# Patient Record
Sex: Female | Born: 1950 | Race: Black or African American | Hispanic: No | Marital: Married | Smoking: Never smoker
Health system: Southern US, Community
[De-identification: ages and names within clinical notes are randomized; demographics above are authoritative.]

## PROBLEM LIST (undated history)

## (undated) ENCOUNTER — Ambulatory Visit: Payer: MEDICARE

## (undated) ENCOUNTER — Ambulatory Visit: Payer: MEDICARE | Attending: Adult Health | Primary: Adult Health

## (undated) ENCOUNTER — Telehealth: Attending: Hematology & Oncology | Primary: Hematology & Oncology

## (undated) ENCOUNTER — Encounter: Attending: Hematology & Oncology | Primary: Hematology & Oncology

## (undated) ENCOUNTER — Ambulatory Visit

## (undated) ENCOUNTER — Encounter: Attending: Internal Medicine | Primary: Internal Medicine

## (undated) ENCOUNTER — Encounter

## (undated) ENCOUNTER — Encounter: Attending: Pharmacist | Primary: Pharmacist

## (undated) ENCOUNTER — Encounter: Attending: Infectious Disease | Primary: Infectious Disease

## (undated) ENCOUNTER — Telehealth: Attending: Adult Health | Primary: Adult Health

## (undated) ENCOUNTER — Encounter: Attending: Adult Health | Primary: Adult Health

## (undated) ENCOUNTER — Telehealth: Attending: Internal Medicine | Primary: Internal Medicine

## (undated) ENCOUNTER — Encounter
Attending: Student in an Organized Health Care Education/Training Program | Primary: Student in an Organized Health Care Education/Training Program

## (undated) ENCOUNTER — Ambulatory Visit
Attending: Student in an Organized Health Care Education/Training Program | Primary: Student in an Organized Health Care Education/Training Program

## (undated) ENCOUNTER — Ambulatory Visit: Payer: MEDICARE | Attending: Hematology & Oncology | Primary: Hematology & Oncology

## (undated) ENCOUNTER — Telehealth

## (undated) ENCOUNTER — Ambulatory Visit: Attending: Hematology & Oncology | Primary: Hematology & Oncology

## (undated) DIAGNOSIS — I10 Essential (primary) hypertension: Secondary | ICD-10-CM

## (undated) DIAGNOSIS — E119 Type 2 diabetes mellitus without complications: Secondary | ICD-10-CM

## (undated) DIAGNOSIS — Z856 Personal history of leukemia: Secondary | ICD-10-CM

## (undated) DIAGNOSIS — N186 End stage renal disease: Secondary | ICD-10-CM

## (undated) DIAGNOSIS — N189 Chronic kidney disease, unspecified: Secondary | ICD-10-CM

## (undated) HISTORY — DX: Chronic kidney disease, unspecified: N18.9

## (undated) MED ORDER — GUAIFENESIN 100 MG/5 ML ORAL LIQUID: ORAL | 0 days

## (undated) MED ORDER — ZANUBRUTINIB 80 MG CAPSULE: ORAL | 0 days

## (undated) MED ORDER — ASPIRIN 81 MG TABLET,DELAYED RELEASE: Freq: Every day | ORAL | 0.00000 days

## (undated) MED ORDER — OXYCODONE 5 MG CAPSULE: Freq: Four times a day (QID) | ORAL | 0 days | PRN

## (undated) MED ORDER — OXYCODONE 5 MG TABLET: ORAL | 0 days | Status: SS | PRN

## (undated) MED ORDER — IPRATROPIUM 0.5 MG-ALBUTEROL 3 MG (2.5 MG BASE)/3 ML NEBULIZATION SOLN: RESPIRATORY_TRACT | 0 days

## (undated) MED ORDER — CARVEDILOL 25 MG TABLET: Freq: Two times a day (BID) | ORAL | 0 days

## (undated) MED ORDER — FUROSEMIDE 80 MG TABLET: Freq: Two times a day (BID) | ORAL | 0 days

## (undated) MED ORDER — BENZONATATE 100 MG CAPSULE: ORAL | 0 days

## (undated) MED ORDER — DOXERCALCIFEROL 2 MCG/ML INTRAVENOUS SOLUTION: INTRAVENOUS | 0 days

---

## 1989-10-22 HISTORY — PX: ABDOMINAL HYSTERECTOMY: SHX81

## 2012-12-03 LAB — HM DEXA SCAN

## 2013-01-20 LAB — HM MAMMOGRAPHY

## 2013-05-27 ENCOUNTER — Emergency Department (HOSPITAL_COMMUNITY)
Admission: EM | Admit: 2013-05-27 | Discharge: 2013-05-27 | Disposition: A | Discharge status: Home / Self Care | Payer: Medicaid - Out of State | Attending: Emergency Medicine | Admitting: Emergency Medicine

## 2013-05-27 ENCOUNTER — Encounter (HOSPITAL_COMMUNITY): Payer: Self-pay | Admitting: Emergency Medicine

## 2013-05-27 ENCOUNTER — Emergency Department (HOSPITAL_COMMUNITY): Payer: Medicaid - Out of State

## 2013-05-27 DIAGNOSIS — C9110 Chronic lymphocytic leukemia of B-cell type not having achieved remission: Secondary | ICD-10-CM | POA: Insufficient documentation

## 2013-05-27 DIAGNOSIS — I10 Essential (primary) hypertension: Secondary | ICD-10-CM | POA: Insufficient documentation

## 2013-05-27 DIAGNOSIS — Z862 Personal history of diseases of the blood and blood-forming organs and certain disorders involving the immune mechanism: Secondary | ICD-10-CM | POA: Insufficient documentation

## 2013-05-27 DIAGNOSIS — E119 Type 2 diabetes mellitus without complications: Secondary | ICD-10-CM | POA: Insufficient documentation

## 2013-05-27 DIAGNOSIS — Z79899 Other long term (current) drug therapy: Secondary | ICD-10-CM | POA: Insufficient documentation

## 2013-05-27 DIAGNOSIS — M545 Low back pain, unspecified: Secondary | ICD-10-CM | POA: Insufficient documentation

## 2013-05-27 DIAGNOSIS — M25551 Pain in right hip: Secondary | ICD-10-CM

## 2013-05-27 DIAGNOSIS — M25559 Pain in unspecified hip: Secondary | ICD-10-CM | POA: Insufficient documentation

## 2013-05-27 HISTORY — DX: Type 2 diabetes mellitus without complications: E11.9

## 2013-05-27 HISTORY — DX: Essential (primary) hypertension: I10

## 2013-05-27 HISTORY — DX: Personal history of leukemia: Z85.6

## 2013-05-27 IMAGING — CR DG LUMBAR SPINE COMPLETE 4+V
5 series · 5 of 5 positions shown · non-contrast
Comparison: None.

CLINICAL DATA: Low back and right hip pain for 1 week

LUMBAR SPINE - COMPLETE 4+ VIEW

[t l-spine a.p.]
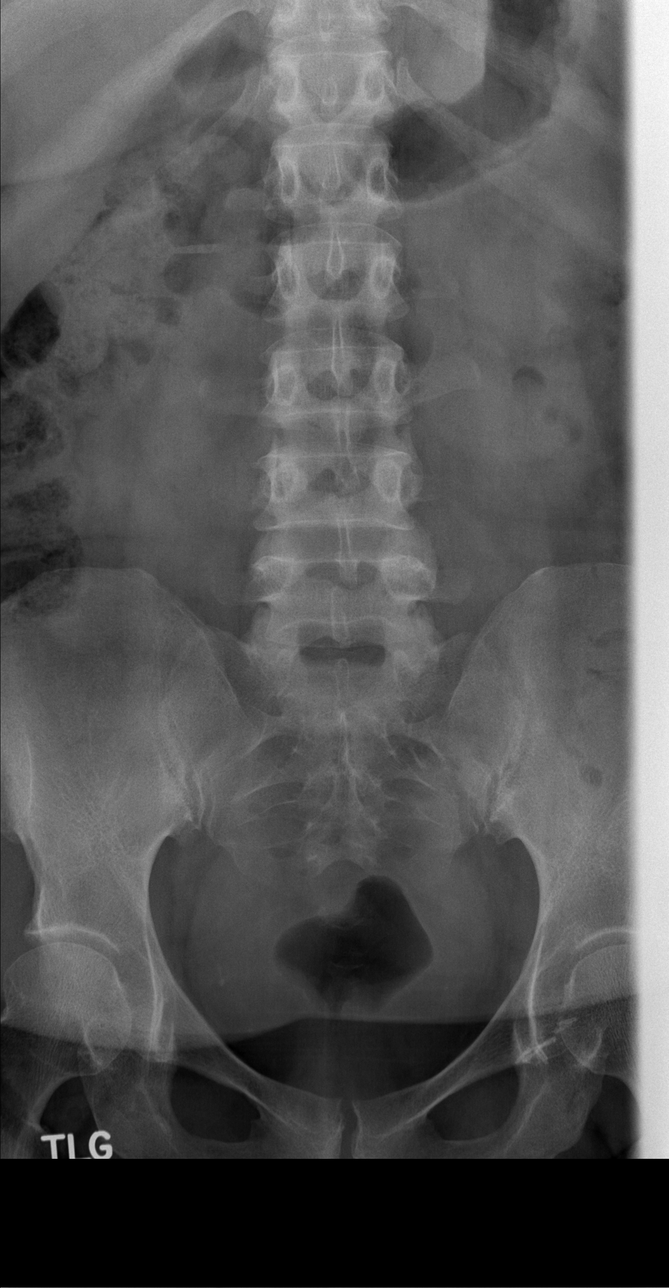

[t l-spine oblique exposure (1 of 2)]
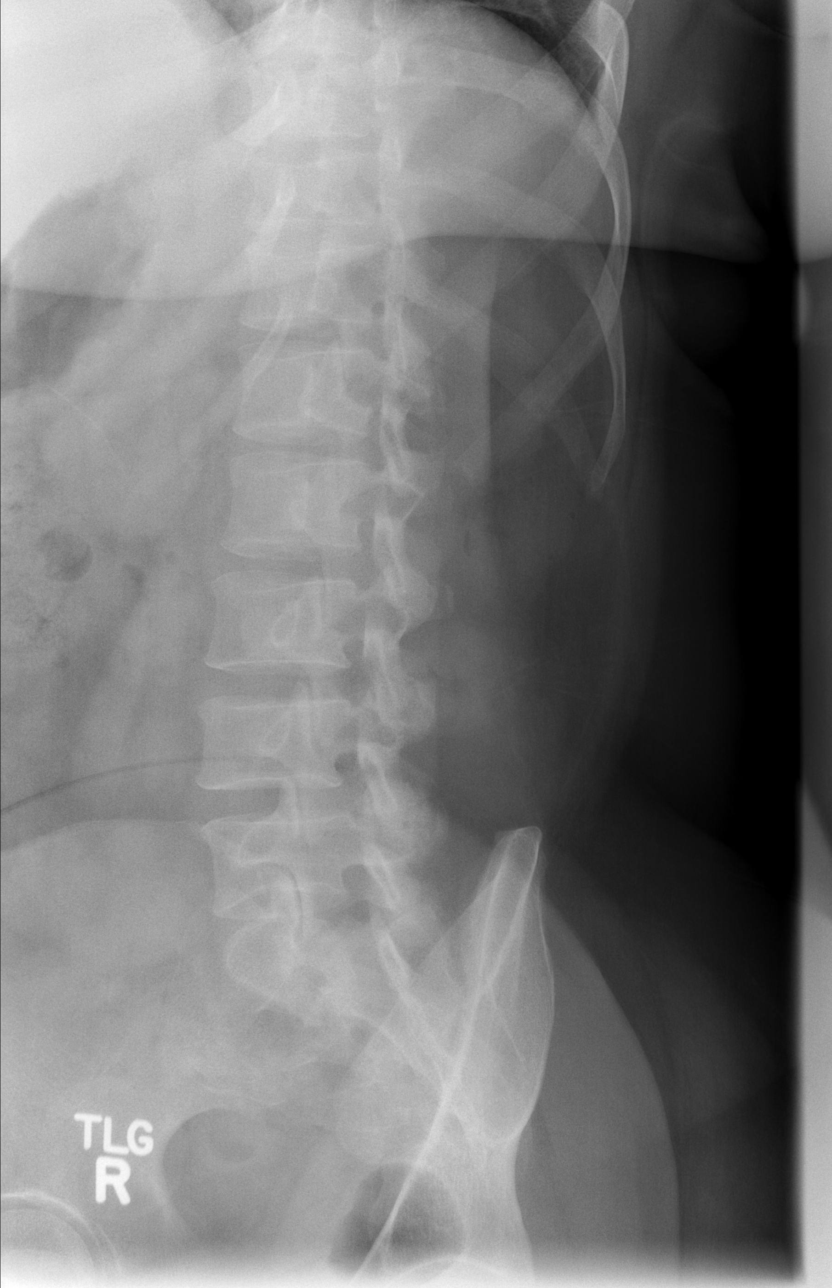

[t l-spine oblique exposure (2 of 2)]
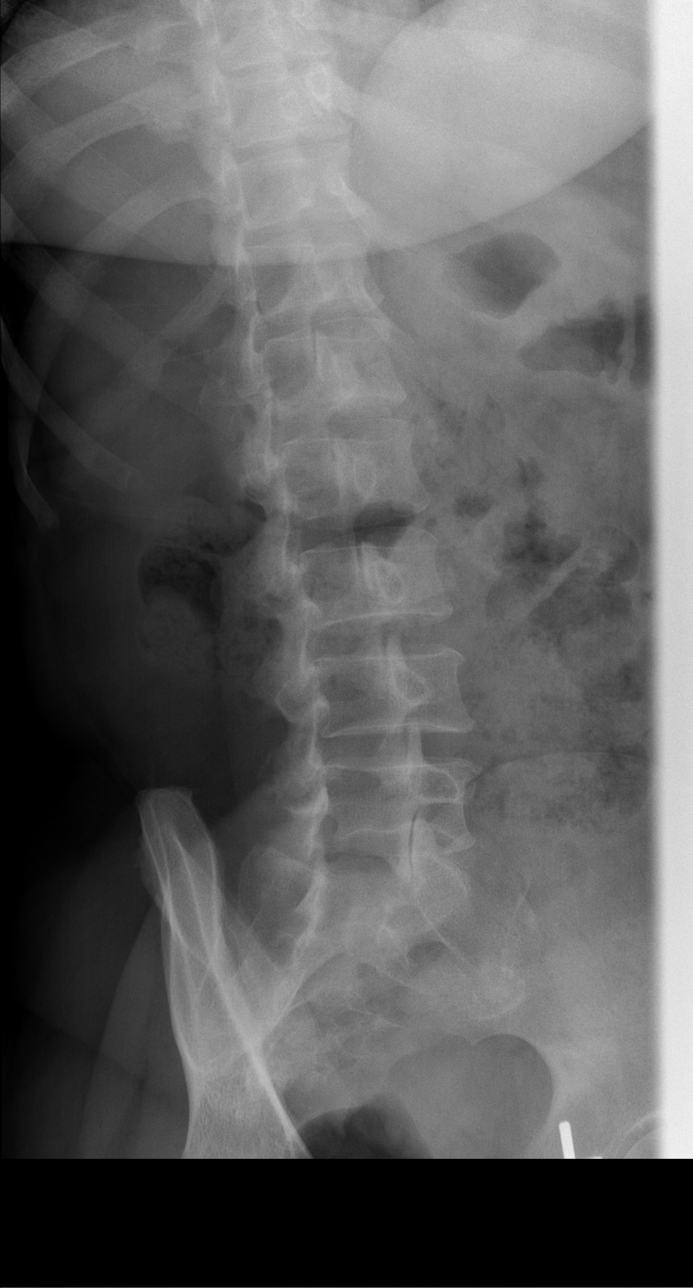

[t l-spine lat]
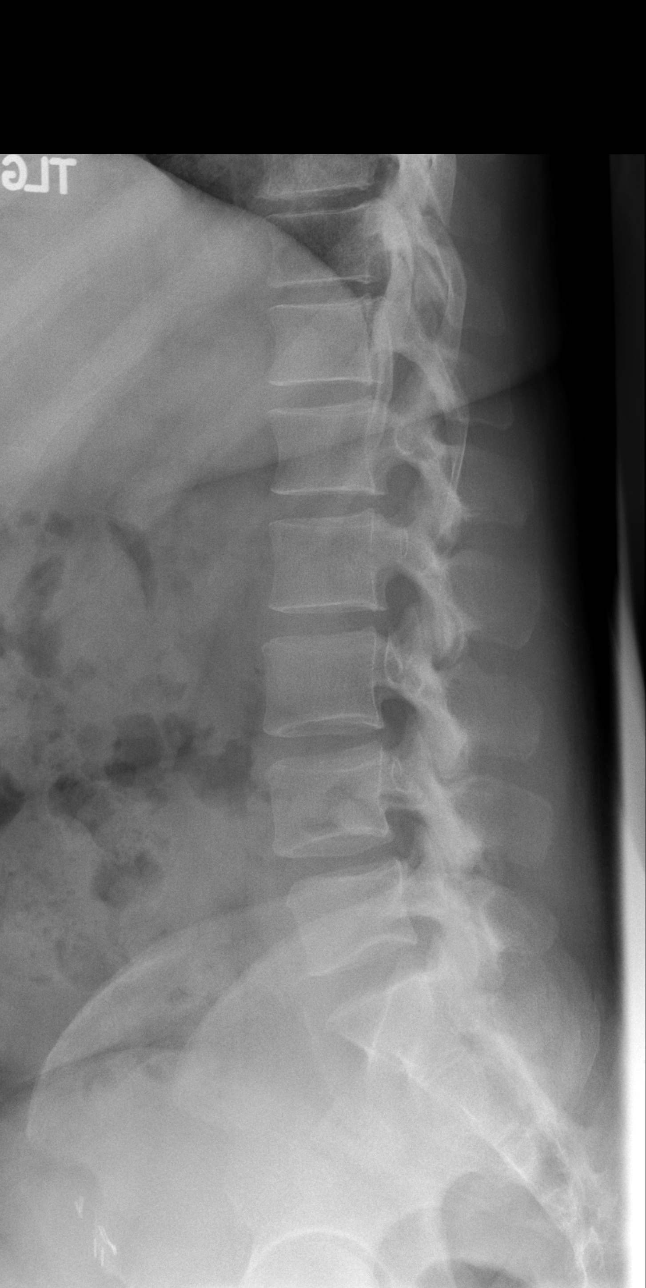

[t l-spine l5-s1 spot]
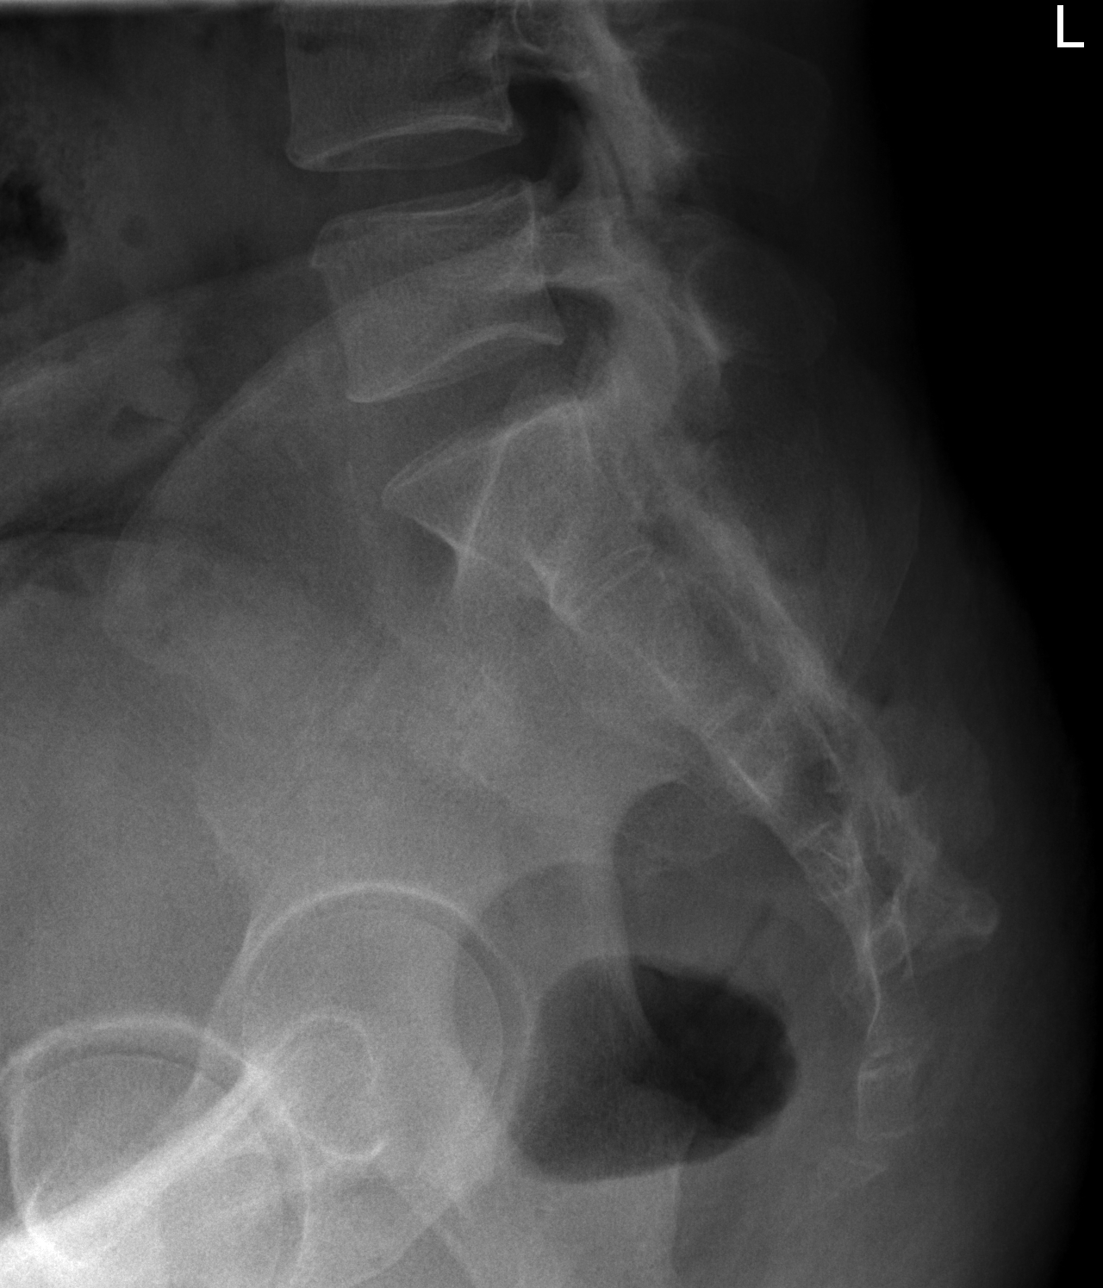

[5 of 5 positions shown; findings below may reference images not displayed]

FINDINGS: The lumbar vertebrae are straightened alignment.
Intervertebral disc spaces appear normal.  No compression deformity
is seen.  There is some degenerative change involving facet joints
of L5-S1.  The SI joints are corticated.
IMPRESSION: Straightened alignment.  Normal disc spaces.  Degenerative change
of the facet joints of L5- S1.

## 2013-05-27 IMAGING — CR DG HIP COMPLETE 2+V*R*
3 series · 3 of 3 positions shown · non-contrast
Comparison: None.

CLINICAL DATA: Low back and right hip pain for 1 week

RIGHT HIP - COMPLETE 2+ VIEW

[t pelvis a.p.]
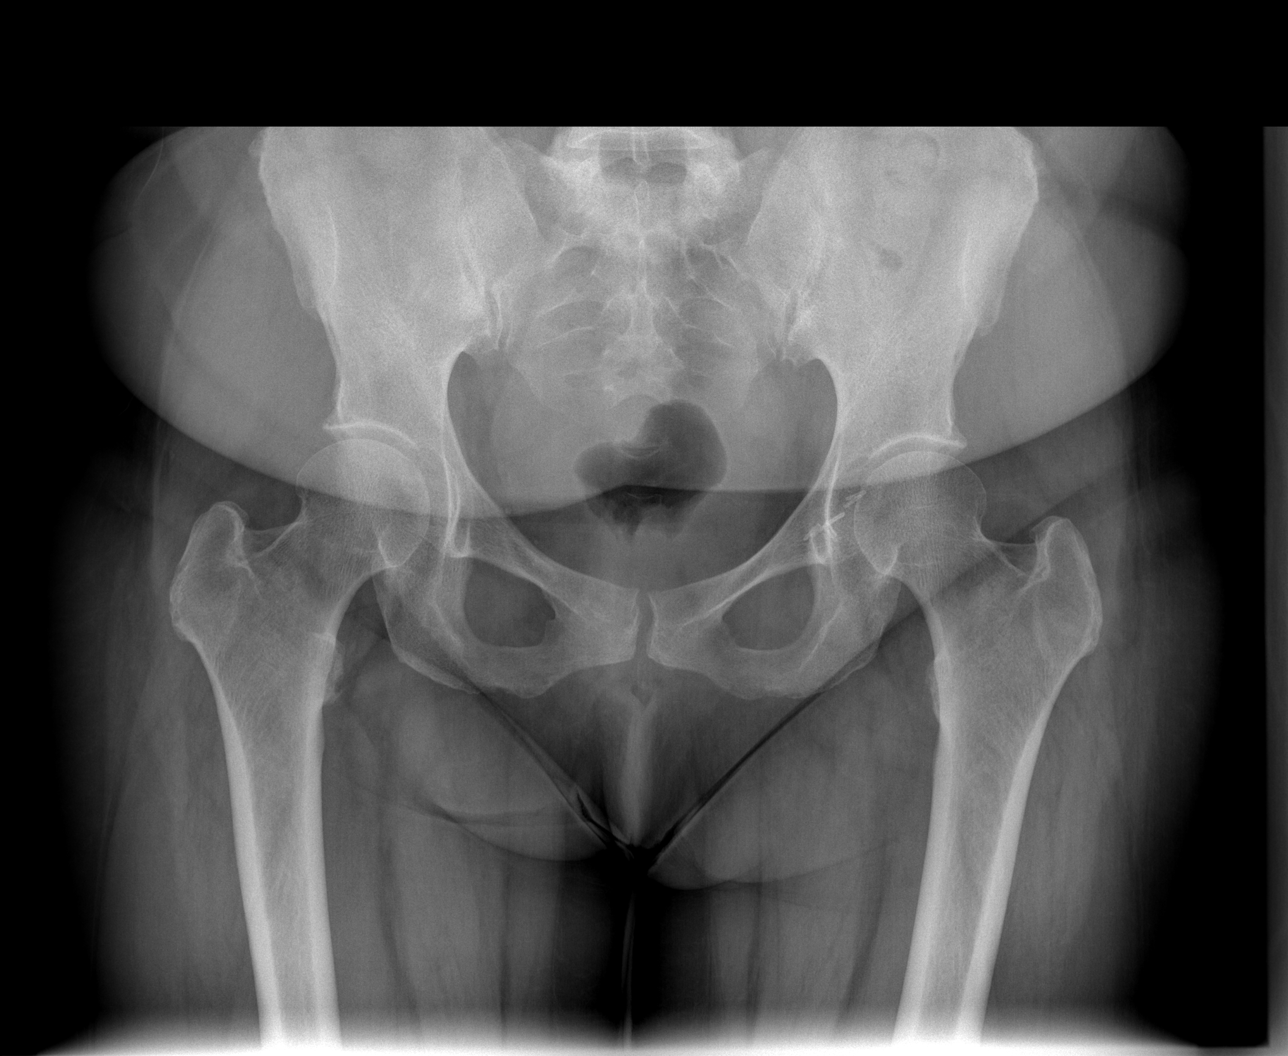

[t hip ap right]
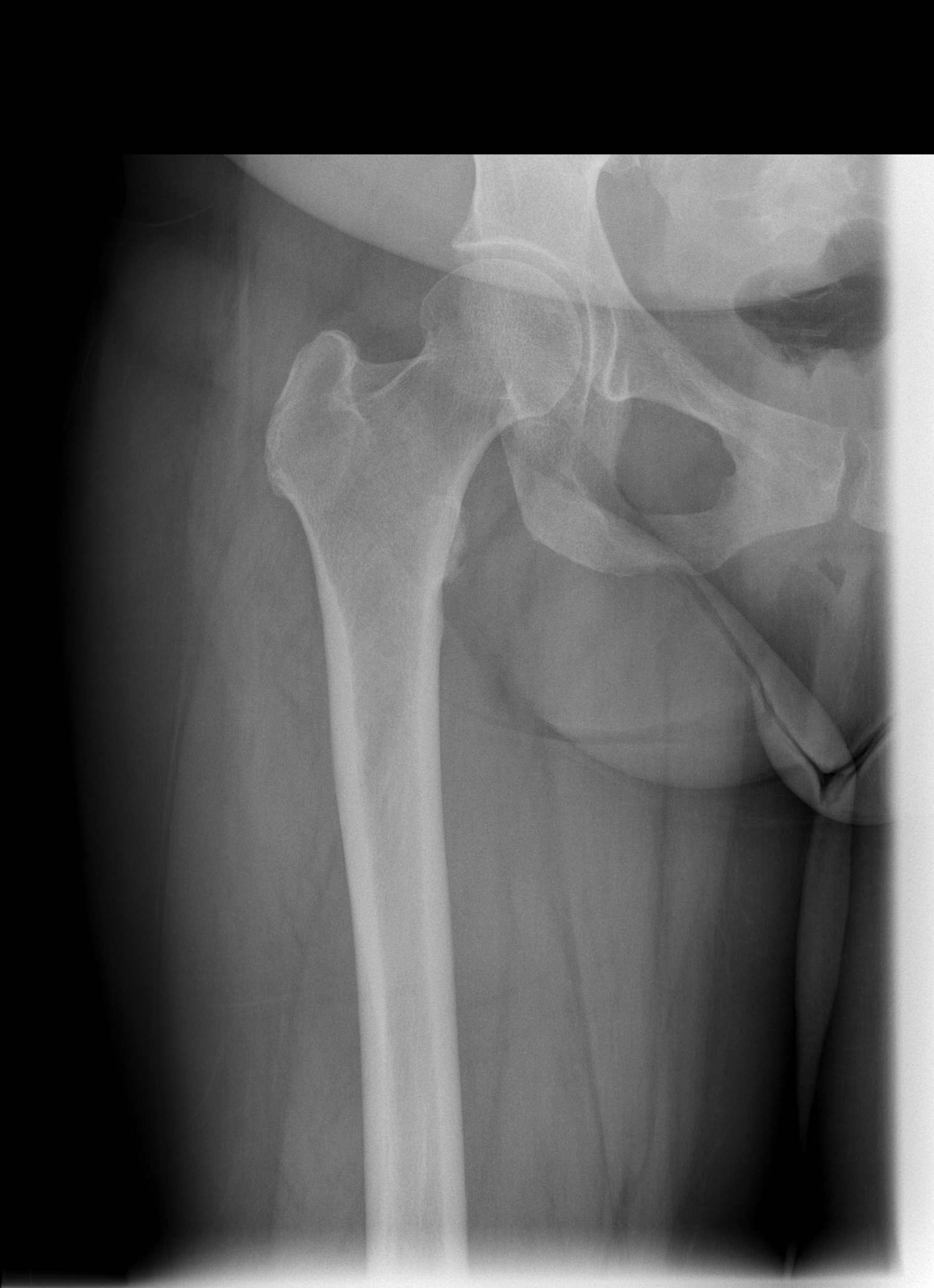

[t hip frog leg right]
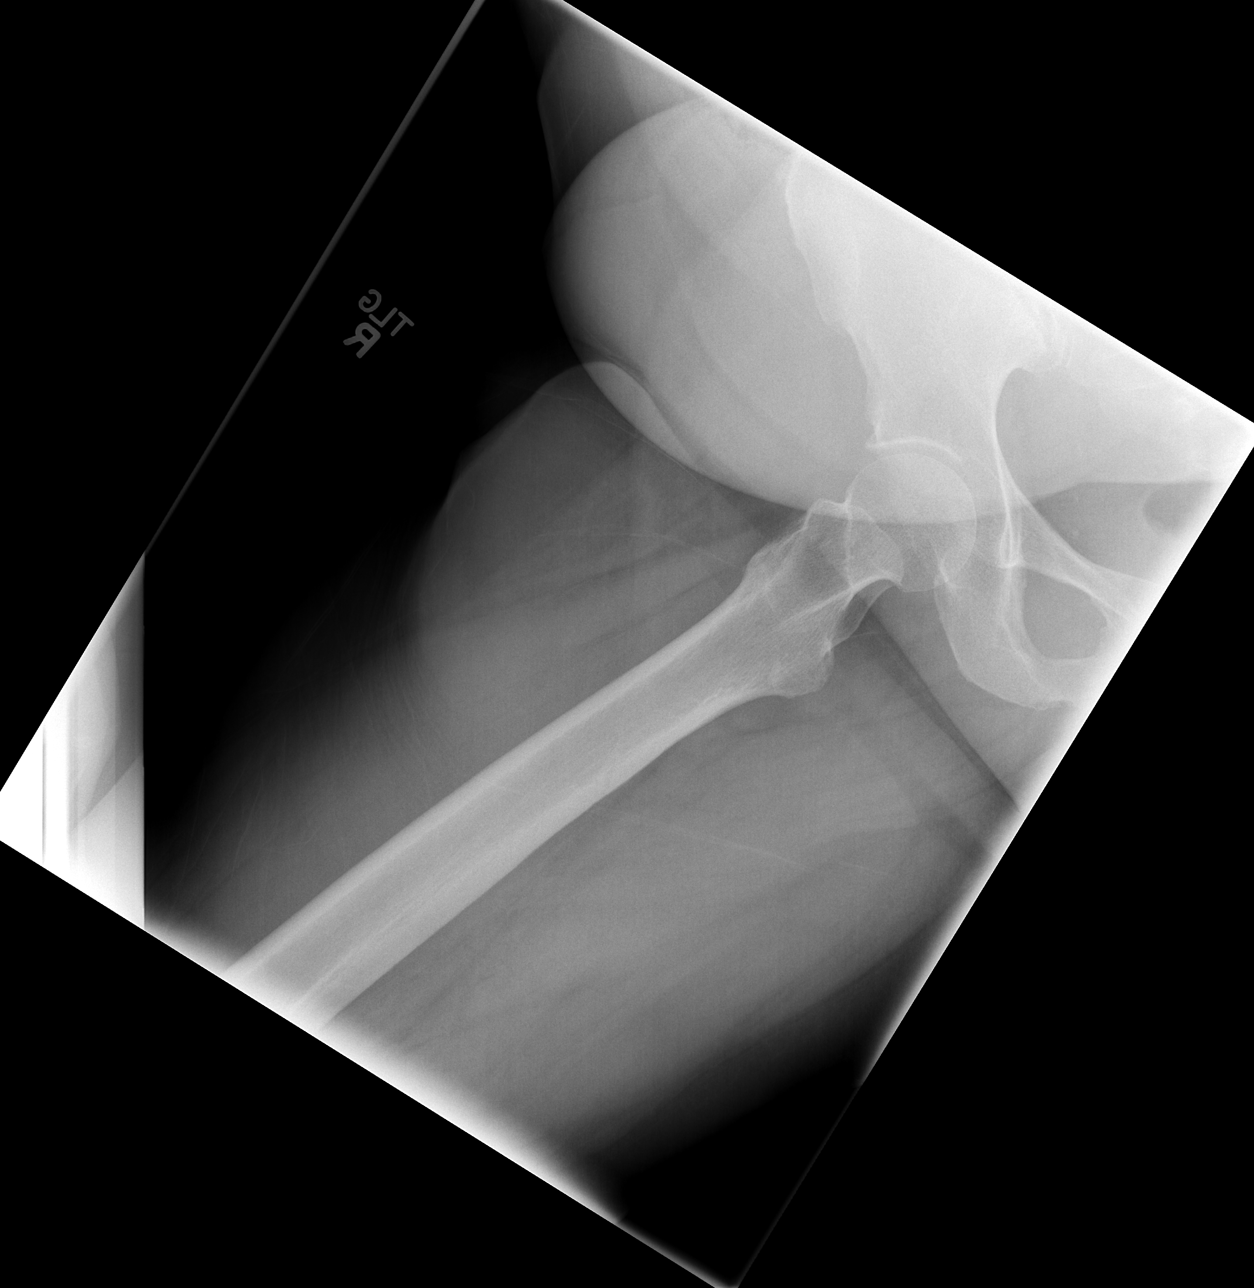

[3 of 3 positions shown; findings below may reference images not displayed]

FINDINGS: No significant degenerative change of the hips is seen
for age.  The pelvic rami are intact.  The SI joints are
corticated.
IMPRESSION: Negative.

## 2013-05-27 MED ORDER — METHOCARBAMOL 750 MG PO TABS: 750.0000 mg | ORAL_TABLET | Freq: Four times a day (QID) | ORAL | Status: DC | PRN

## 2013-05-27 MED ORDER — HYDROCODONE-ACETAMINOPHEN 5-325 MG PO TABS
1.0000 | ORAL_TABLET | Freq: Once | ORAL | Status: AC
  Administered 2013-05-27: 1 via ORAL
  Filled 2013-05-27: qty 1

## 2013-05-27 NOTE — ED Notes (Signed)
Patient transported to X-ray 

## 2013-05-27 NOTE — ED Notes (Signed)
Pt c/o lower back pain x 4 days since moving

## 2013-05-27 NOTE — ED Provider Notes (Signed)
CSN: [id number]     Arrival date & time 05/27/13  1244 History     First MD Initiated Contact with Patient 05/27/13 1413     Chief Complaint  Patient presents with  . Back Pain   (Consider location/radiation/quality/duration/timing/severity/associated sxs/prior Treatment) HPI Comments: Patient reports right lower back and right hip pain x 6 days.  States she moved two weeks ago and has been lifting a lot of boxes and heavy objects.  No specific injury known.  Has developed back and hip pain, worse with movement, certain positions, particularly the act of standing up or sitting down.  Denies fevers, chills, body aches, abdominal pain, N/V/D, urinary or vaginal symptoms.  No radiation of pain.  No weakness or numbness of the legs.  No bowel or bladder incontinence or retention.  No hx back problems.   Patient is a 62 y.o. female presenting with back pain. The history is provided by the patient.  Back Pain Associated symptoms: no abdominal pain, no chest pain, no dysuria, no fever, no numbness and no weakness     Past Medical History  Diagnosis Date  . Hypertension   . Diabetes mellitus without complication   . History of leukemia    History reviewed. No pertinent past surgical history. History reviewed. No pertinent family history. History  Substance Use Topics  . Smoking status: Never Smoker   . Smokeless tobacco: Not on file  . Alcohol Use: Yes     Comment: occ   OB History   Grav Para Term Preterm Abortions TAB SAB Ect Mult Living                 Review of Systems  Constitutional: Negative for fever.  Respiratory: Negative for cough and shortness of breath.   Cardiovascular: Negative for chest pain.  Gastrointestinal: Negative for nausea, vomiting, abdominal pain and diarrhea.  Genitourinary: Negative for dysuria, urgency, frequency, vaginal bleeding and vaginal discharge.  Musculoskeletal: Positive for back pain.  Neurological: Negative for weakness and numbness.     Allergies  Review of patient's allergies indicates no known allergies.  Home Medications   Current Outpatient Rx  Name  Route  Sig  Dispense  Refill  . amLODipine (NORVASC) 10 MG tablet   Oral   Take 10 mg by mouth daily.         Marland Kitchen glipiZIDE (GLUCOTROL) 5 MG tablet   Oral   Take 5 mg by mouth daily.         . hydrochlorothiazide (HYDRODIURIL) 25 MG tablet   Oral   Take 25 mg by mouth daily.         Marland Kitchen losartan (COZAAR) 100 MG tablet   Oral   Take 100 mg by mouth daily.          There were no vitals taken for this visit. Physical Exam  Nursing note and vitals reviewed. Constitutional: She appears well-developed and well-nourished. No distress.  HENT:  Head: Normocephalic and atraumatic.  Neck: Neck supple.  Pulmonary/Chest: Effort normal.  Abdominal: Soft. She exhibits no distension and no mass. There is no tenderness. There is no rebound and no guarding.  Musculoskeletal:       Right hip: She exhibits tenderness. She exhibits normal range of motion, normal strength, no swelling, no crepitus, no deformity and no laceration.       Cervical back: She exhibits no bony tenderness.       Thoracic back: She exhibits no bony tenderness.  Lumbar back: She exhibits no bony tenderness.       Back:  Spine nontender, no crepitus or steopoffs. Lower extremities:  Strength 5/5, sensation intact, distal pulses intact.     Neurological: She is alert.  Skin: She is not diaphoretic.    ED Course   Procedures (including critical care time)  Labs Reviewed - No data to display Dg Lumbar Spine Complete  05/27/2013   *RADIOLOGY REPORT*  Clinical Data: Low back and right hip pain for 1 week  LUMBAR SPINE - COMPLETE 4+ VIEW  Comparison: None.  Findings: The lumbar vertebrae are straightened alignment. Intervertebral disc spaces appear normal.  No compression deformity is seen.  There is some degenerative change involving facet joints of L5-S1.  The SI joints are corticated.   IMPRESSION:  Straightened alignment.  Normal disc spaces.  Degenerative change of the facet joints of L5- S1.   Original Report Authenticated By: Ivar Drape, M.D.   Dg Hip Complete Right  05/27/2013   *RADIOLOGY REPORT*  Clinical Data: Low back and right hip pain for 1 week  RIGHT HIP - COMPLETE 2+ VIEW  Comparison: None.  Findings: No significant degenerative change of the hips is seen for age.  The pelvic rami are intact.  The SI joints are corticated.  IMPRESSION: Negative.   Original Report Authenticated By: Ivar Drape, M.D.   1. Low back pain   2. Right hip pain     MDM  Pt with no prior hx back pain p/w right lower back pain and right hip pain.  She does have hx OA.  Neurovascularly intact.  No red flags for back pain.  Likely muscle strain as patient has been moving over the past few weeks and has been doing heavy lifting.  Xrays negative.  Pt d/c home with robaxin, PCP resources for follow up.  Discussed all results with patient.  Pt given return precautions.  Pt verbalizes understanding and agrees with plan.      [place], PA-C 05/27/13 [phone]

## 2013-05-27 NOTE — ED Provider Notes (Signed)
  Medical screening examination/treatment/procedure(s) were performed by non-physician practitioner and as supervising physician I was immediately available for consultation/collaboration.    Carmin Muskrat, MD 05/27/13 [phone]

## 2014-02-08 ENCOUNTER — Ambulatory Visit: Payer: Self-pay

## 2015-06-02 LAB — LIPID PANEL
CHOLESTEROL: 134 mg/dL (ref 0–200)
HDL: 43 mg/dL (ref 35–70)
LDL Cholesterol: 69 mg/dL
TRIGLYCERIDES: 109 mg/dL (ref 40–160)

## 2015-06-02 LAB — HEMOGLOBIN A1C: HEMOGLOBIN A1C: 6.5 % — AB (ref 4.0–6.0)

## 2015-07-14 ENCOUNTER — Telehealth: Payer: Self-pay | Admitting: General Practice

## 2015-07-14 NOTE — Telephone Encounter (Signed)
Left message asking pt to call office Please confirm what type of insurance pt has make sure it is not medicaid

## 2015-07-14 NOTE — Telephone Encounter (Signed)
Pt called back spoke to carrie she has [place] and medicare. She does not have medicaid

## 2015-07-19 ENCOUNTER — Encounter (INDEPENDENT_AMBULATORY_CARE_PROVIDER_SITE_OTHER): Payer: Self-pay

## 2015-07-19 ENCOUNTER — Ambulatory Visit (INDEPENDENT_AMBULATORY_CARE_PROVIDER_SITE_OTHER): Payer: Commercial Managed Care - HMO | Admitting: Internal Medicine

## 2015-07-19 ENCOUNTER — Encounter: Payer: Self-pay | Admitting: Internal Medicine

## 2015-07-19 VITALS — BP 116/58 | HR 67 | Temp 98.8°F | Ht 59.33 in | Wt 157.5 lb

## 2015-07-19 DIAGNOSIS — N183 Chronic kidney disease, stage 3 unspecified: Secondary | ICD-10-CM

## 2015-07-19 DIAGNOSIS — B029 Zoster without complications: Secondary | ICD-10-CM

## 2015-07-19 DIAGNOSIS — E119 Type 2 diabetes mellitus without complications: Secondary | ICD-10-CM | POA: Diagnosis not present

## 2015-07-19 DIAGNOSIS — E785 Hyperlipidemia, unspecified: Secondary | ICD-10-CM | POA: Diagnosis not present

## 2015-07-19 DIAGNOSIS — M47819 Spondylosis without myelopathy or radiculopathy, site unspecified: Secondary | ICD-10-CM

## 2015-07-19 DIAGNOSIS — C9110 Chronic lymphocytic leukemia of B-cell type not having achieved remission: Secondary | ICD-10-CM

## 2015-07-19 DIAGNOSIS — Z23 Encounter for immunization: Secondary | ICD-10-CM | POA: Diagnosis not present

## 2015-07-19 DIAGNOSIS — I10 Essential (primary) hypertension: Secondary | ICD-10-CM | POA: Diagnosis not present

## 2015-07-19 MED ORDER — AMLODIPINE 10 MG TABLET
Freq: Every day | ORAL | 0 days
Start: 2015-07-19 — End: ?

## 2015-07-19 MED ORDER — AMLODIPINE BESYLATE 10 MG PO TABS: 10.0000 mg | ORAL_TABLET | Freq: Every day | ORAL | Status: DC

## 2015-07-19 MED ORDER — ATORVASTATIN CALCIUM 10 MG PO TABS: 10.0000 mg | ORAL_TABLET | Freq: Every day | ORAL | Status: DC

## 2015-07-19 NOTE — Assessment & Plan Note (Signed)
Pain controlled with Tylenol Arthritis

## 2015-07-19 NOTE — Addendum Note (Signed)
Addended by: Lurlean Nanny on: 07/19/2015 01:46 PM   Modules accepted: Orders

## 2015-07-19 NOTE — Assessment & Plan Note (Signed)
She will continue to follow with oncology. 

## 2015-07-19 NOTE — Progress Notes (Signed)
Pre visit review using our clinic review tool, if applicable. No additional management support is needed unless otherwise documented below in the visit note. 

## 2015-07-19 NOTE — Assessment & Plan Note (Signed)
Will decrease Lipitor to 10 mg daily Advised her to start taking a baby ASA daily

## 2015-07-19 NOTE — Assessment & Plan Note (Signed)
BP on the low end Stop Coreg Continue Losartan and Amlodipine Will request ECG from previous [facility]

## 2015-07-19 NOTE — Patient Instructions (Signed)
Fat and Cholesterol Control Diet Fat and cholesterol levels in your blood and organs are influenced by your diet. High levels of fat and cholesterol may lead to diseases of the heart, small and large blood vessels, gallbladder, liver, and pancreas. CONTROLLING FAT AND CHOLESTEROL WITH DIET Although exercise and lifestyle factors are important, your diet is key. That is because certain foods are known to raise cholesterol and others to lower it. The goal is to balance foods for their effect on cholesterol and more importantly, to replace saturated and trans fat with other types of fat, such as monounsaturated fat, polyunsaturated fat, and omega-3 fatty acids. On average, a person should consume no more than 15 to 17 g of saturated fat daily. Saturated and trans fats are considered "bad" fats, and they will raise LDL cholesterol. Saturated fats are primarily found in animal products such as meats, butter, and cream. However, that does not mean you need to give up all your favorite foods. Today, there are good tasting, low-fat, low-cholesterol substitutes for most of the things you like to eat. Choose low-fat or nonfat alternatives. Choose round or loin cuts of red meat. These types of cuts are lowest in fat and cholesterol. Chicken (without the skin), fish, veal, and ground turkey breast are great choices. Eliminate fatty meats, such as hot dogs and salami. Even shellfish have little or no saturated fat. Have a 3 oz (85 g) portion when you eat lean meat, poultry, or fish. Trans fats are also called "partially hydrogenated oils." They are oils that have been scientifically manipulated so that they are solid at room temperature resulting in a longer shelf life and improved taste and texture of foods in which they are added. Trans fats are found in stick margarine, some tub margarines, cookies, crackers, and baked goods.  When baking and cooking, oils are a great substitute for butter. The monounsaturated oils are  especially beneficial since it is believed they lower LDL and raise HDL. The oils you should avoid entirely are saturated tropical oils, such as coconut and palm.  Remember to eat a lot from food groups that are naturally free of saturated and trans fat, including fish, fruit, vegetables, beans, grains (barley, rice, couscous, bulgur wheat), and pasta (without cream sauces).  IDENTIFYING FOODS THAT LOWER FAT AND CHOLESTEROL  Soluble fiber may lower your cholesterol. This type of fiber is found in fruits such as apples, vegetables such as broccoli, potatoes, and carrots, legumes such as beans, peas, and lentils, and grains such as barley. Foods fortified with plant sterols (phytosterol) may also lower cholesterol. You should eat at least 2 g per day of these foods for a cholesterol lowering effect.  Read package labels to identify low-saturated fats, trans fat free, and low-fat foods at the supermarket. Select cheeses that have only 2 to 3 g saturated fat per ounce. Use a heart-healthy tub margarine that is free of trans fats or partially hydrogenated oil. When buying baked goods (cookies, crackers), avoid partially hydrogenated oils. Breads and muffins should be made from whole grains (whole-wheat or whole oat flour, instead of "flour" or "enriched flour"). Buy non-creamy canned soups with reduced salt and no added fats.  FOOD PREPARATION TECHNIQUES  Never deep-fry. If you must fry, either stir-fry, which uses very little fat, or use non-stick cooking sprays. When possible, broil, bake, or roast meats, and steam vegetables. Instead of putting butter or margarine on vegetables, use lemon and herbs, applesauce, and cinnamon (for squash and sweet potatoes). Use nonfat   yogurt, salsa, and low-fat dressings for salads.  LOW-SATURATED FAT / LOW-FAT FOOD SUBSTITUTES Meats / Saturated Fat (g)  Avoid: Steak, marbled (3 oz/85 g) / 11 g  Choose: Steak, lean (3 oz/85 g) / 4 g  Avoid: Hamburger (3 oz/85 g) / 7  g  Choose: Hamburger, lean (3 oz/85 g) / 5 g  Avoid: Ham (3 oz/85 g) / 6 g  Choose: Ham, lean cut (3 oz/85 g) / 2.4 g  Avoid: Chicken, with skin, dark meat (3 oz/85 g) / 4 g  Choose: Chicken, skin removed, dark meat (3 oz/85 g) / 2 g  Avoid: Chicken, with skin, light meat (3 oz/85 g) / 2.5 g  Choose: Chicken, skin removed, light meat (3 oz/85 g) / 1 g Dairy / Saturated Fat (g)  Avoid: Whole milk (1 cup) / 5 g  Choose: Low-fat milk, 2% (1 cup) / 3 g  Choose: Low-fat milk, 1% (1 cup) / 1.5 g  Choose: Skim milk (1 cup) / 0.3 g  Avoid: Hard cheese (1 oz/28 g) / 6 g  Choose: Skim milk cheese (1 oz/28 g) / 2 to 3 g  Avoid: Cottage cheese, 4% fat (1 cup) / 6.5 g  Choose: Low-fat cottage cheese, 1% fat (1 cup) / 1.5 g  Avoid: Ice cream (1 cup) / 9 g  Choose: Sherbet (1 cup) / 2.5 g  Choose: Nonfat frozen yogurt (1 cup) / 0.3 g  Choose: Frozen fruit bar / trace  Avoid: Whipped cream (1 tbs) / 3.5 g  Choose: Nondairy whipped topping (1 tbs) / 1 g Condiments / Saturated Fat (g)  Avoid: Mayonnaise (1 tbs) / 2 g  Choose: Low-fat mayonnaise (1 tbs) / 1 g  Avoid: Butter (1 tbs) / 7 g  Choose: Extra light margarine (1 tbs) / 1 g  Avoid: Coconut oil (1 tbs) / 11.8 g  Choose: Olive oil (1 tbs) / 1.8 g  Choose: Corn oil (1 tbs) / 1.7 g  Choose: Safflower oil (1 tbs) / 1.2 g  Choose: Sunflower oil (1 tbs) / 1.4 g  Choose: Soybean oil (1 tbs) / 2.4 g  Choose: Canola oil (1 tbs) / 1 g Document Released: 10/08/2005 Document Revised: 02/02/2013 Document Reviewed: 01/06/2014 [employer] Patient Information 2015 [employer]. This information is not intended to replace advice given to you by your health care provider. Make sure you discuss any questions you have with your health care provider.  

## 2015-07-19 NOTE — Assessment & Plan Note (Signed)
Continue Zovirax daily

## 2015-07-19 NOTE — Assessment & Plan Note (Signed)
Recent A1C shows good control Stop Actos Continue Glipizide Will recheck A1C in 2 months, may need to go up on Glipizide No microalbumin secondary to ARB therapy Flu and Prevnar UTD Pneumovax UTD Foot exam today Continue yearly eye exams

## 2015-07-19 NOTE — Progress Notes (Signed)
HPI  Pt presents to the clinic today to establish care and for management of the conditions listed below. She is transferring care from the [facility].  HTN: Her BP is well controlled. She takes Losartan, Carvedilol and Norvasc. She reports she ran out of Norvasc 2 weeks ago. She reports her BP did get up to 207/108. She denies chest pain, chest tightness or shortness of breath. She has had an ECG in the past.  DM 2: Her last A1C was 6.5 (05/2015) She does check her sugars. They range 57-157. She takes Glipizide and Actos. Last eye exam was 7/106. She wants a flu shot today. She had Pneumovax 2015. She has never had Prevnar. She does check her feet frequently.  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  CKD: Her last creatinine was 1.83. She follows with a nephrologist at the [place]. She sees him next week.  HLD: Her last LDL was 69. She takes Lipitor daily. She denies myalgias. She does try to consume a low fat diet.  Arthritis in low back: Pain level is a 3/4 out of 10. Worse in the last 2 weeks. She takes Tylenol arthritis which does help.  CLL: She is getting IG monthly. She complains mostly of fatigue. She does follow with an oncologist at the [place].  Flu: 07/2014, wants one today Tetanus: > 10 years ago Zostovax: 2015 Pneumovax: 2015 Prevnar: never Pap Smear: Hysterectomy in 1991. 07/2014- abnormal Mammogram: 07/2014 Bone Density: unsure Colon Screening: 2009 at [place] Screening: yearly 04/2015 Dentist: as needed  Past Medical History  Diagnosis Date  . Hypertension   . Diabetes mellitus without complication   . History of leukemia   . Chronic kidney disease     Current Outpatient Prescriptions  Medication Sig Dispense Refill  . acyclovir (ZOVIRAX) 400 MG tablet     . amLODipine (NORVASC) 10 MG tablet Take 10 mg by mouth daily.    Marland Kitchen atorvastatin (LIPITOR) 20 MG tablet Take 20 mg by mouth daily at 6 PM.     . Calcium Carb-Cholecalciferol (OYSTER SHELL  CALCIUM/VITAMIN D) 250-125 MG-UNIT TABS Take 1 tablet by mouth daily.     . carvedilol (COREG) 12.5 MG tablet Take 12.5 mg by mouth 2 (two) times daily with a meal.    . Cyanocobalamin (VITAMIN B-12) 5000 MCG SUBL Place 1 tablet under the tongue daily.    Marland Kitchen glipiZIDE (GLUCOTROL) 5 MG tablet Take 5 mg by mouth daily.    Marland Kitchen losartan (COZAAR) 100 MG tablet Take 100 mg by mouth daily.    . Multiple Vitamins-Minerals (EYE SUPPORT) TABS Take 1 tablet by mouth daily. Occuvite    . pioglitazone (ACTOS) 45 MG tablet Take 45 mg by mouth daily.      No current facility-administered medications for this visit.    Allergies  Allergen Reactions  . Lisinopril Cough    Family History  Problem Relation Age of Onset  . Hyperlipidemia Mother   . Diabetes Mother   . Diabetes Father   . Hyperlipidemia Father     Social History   Social History  . Marital Status: Married    Spouse Name: N/A  . Number of Children: N/A  . Years of Education: N/A   Occupational History  . Not on file.   Social History Main Topics  . Smoking status: Never Smoker   . Smokeless tobacco: Never Used  . Alcohol Use: No  . Drug Use: No  . Sexual Activity: Not on file  Other Topics Concern  . Not on file   Social History Narrative    ROS:  Constitutional: Pt reports fatigue. Denies fever, malaise, headache or abrupt weight changes.  HEENT: Denies eye pain, eye redness, ear pain, ringing in the ears, wax buildup, runny nose, nasal congestion, bloody nose, or sore throat. Respiratory: Denies difficulty breathing, shortness of breath, cough or sputum production.   Cardiovascular: Denies chest pain, chest tightness, palpitations or swelling in the hands or feet.  Gastrointestinal: Denies abdominal pain, bloating, constipation, diarrhea or blood in the stool.  GU: Denies frequency, urgency, pain with urination, blood in urine, odor or discharge. Musculoskeletal: Pt reports low back pain. Denies difficulty with  gait, muscle pain or joint swelling.  Skin: Denies redness, rashes, lesions or ulcercations.  Neurological: Denies dizziness, difficulty with memory, difficulty with speech or problems with balance and coordination.  Psych: Denies anxiety, depression, SI/HI.  No other specific complaints in a complete review of systems (except as listed in HPI above).  PE:  BP 116/58 mmHg  Pulse 67  Temp(Src) 98.8 F (37.1 C) (Oral)  Ht 4' 11.33" (1.507 m)  Wt 157 lb 8 oz (71.442 kg)  BMI 31.46 kg/m2  SpO2 97%  LMP  (LMP Unknown) Wt Readings from Last 3 Encounters:  07/19/15 157 lb 8 oz (71.442 kg)    General: Appears her stated age, obese in NAD. Skin: Warm, dry and intact. HEENT: Head: normal shape and size; Eyes: sclera white, no icterus, conjunctiva pink, PERRLA and EOMs intact;  Neck: Neck supple, trachea midline. No masses, lumps or thyromegaly present.  Cardiovascular: Normal rate and rhythm. S1,S2 noted.  No murmur, rubs or gallops noted. No JVD or BLE edema. No carotid bruits noted. Pulmonary/Chest: Normal effort and positive vesicular breath sounds. No respiratory distress. No wheezes, rales or ronchi noted.  Abdomen: Soft, nontender, active BS. Musculoskeletal: Normal flexion, extension and rotation of the spine. Mild pain with palpation over the lumbar spine.  Strength 5/5 BLE. No signs of joint swelling. No difficulty with gait.  Neurological: Alert and oriented. Sensation intact to BLE. Psychiatric: Mood and affect normal. Behavior is normal. Judgment and thought content normal.    Assessment and Plan:

## 2015-08-24 ENCOUNTER — Encounter: Payer: Self-pay | Admitting: Internal Medicine

## 2015-12-02 ENCOUNTER — Encounter: Payer: Self-pay | Admitting: Internal Medicine

## 2016-01-03 ENCOUNTER — Emergency Department
Admission: EM | Admit: 2016-01-03 | Discharge: 2016-01-04 | Disposition: A | Discharge status: Home / Self Care | Payer: Commercial Managed Care - HMO | Attending: Student | Admitting: Student

## 2016-01-03 ENCOUNTER — Emergency Department: Payer: Commercial Managed Care - HMO

## 2016-01-03 DIAGNOSIS — E785 Hyperlipidemia, unspecified: Secondary | ICD-10-CM | POA: Diagnosis not present

## 2016-01-03 DIAGNOSIS — I129 Hypertensive chronic kidney disease with stage 1 through stage 4 chronic kidney disease, or unspecified chronic kidney disease: Secondary | ICD-10-CM | POA: Diagnosis not present

## 2016-01-03 DIAGNOSIS — N189 Chronic kidney disease, unspecified: Secondary | ICD-10-CM | POA: Diagnosis not present

## 2016-01-03 DIAGNOSIS — Z7984 Long term (current) use of oral hypoglycemic drugs: Secondary | ICD-10-CM | POA: Diagnosis not present

## 2016-01-03 DIAGNOSIS — Y998 Other external cause status: Secondary | ICD-10-CM | POA: Diagnosis not present

## 2016-01-03 DIAGNOSIS — S4991XA Unspecified injury of right shoulder and upper arm, initial encounter: Secondary | ICD-10-CM | POA: Diagnosis present

## 2016-01-03 DIAGNOSIS — Y92009 Unspecified place in unspecified non-institutional (private) residence as the place of occurrence of the external cause: Secondary | ICD-10-CM | POA: Insufficient documentation

## 2016-01-03 DIAGNOSIS — Z79899 Other long term (current) drug therapy: Secondary | ICD-10-CM | POA: Insufficient documentation

## 2016-01-03 DIAGNOSIS — E119 Type 2 diabetes mellitus without complications: Secondary | ICD-10-CM | POA: Diagnosis not present

## 2016-01-03 DIAGNOSIS — F419 Anxiety disorder, unspecified: Secondary | ICD-10-CM | POA: Diagnosis not present

## 2016-01-03 DIAGNOSIS — M25511 Pain in right shoulder: Secondary | ICD-10-CM | POA: Diagnosis not present

## 2016-01-03 DIAGNOSIS — T7411XA Adult physical abuse, confirmed, initial encounter: Secondary | ICD-10-CM | POA: Diagnosis not present

## 2016-01-03 DIAGNOSIS — D649 Anemia, unspecified: Secondary | ICD-10-CM | POA: Diagnosis not present

## 2016-01-03 DIAGNOSIS — S43401A Unspecified sprain of right shoulder joint, initial encounter: Secondary | ICD-10-CM | POA: Insufficient documentation

## 2016-01-03 DIAGNOSIS — Y9389 Activity, other specified: Secondary | ICD-10-CM | POA: Insufficient documentation

## 2016-01-03 MED ORDER — TRAMADOL HCL 50 MG PO TABS
50.0000 mg | ORAL_TABLET | Freq: Once | ORAL | Status: AC
  Administered 2016-01-03: 50 mg via ORAL
  Filled 2016-01-03: qty 1

## 2016-01-03 MED ORDER — NAPROXEN 500 MG PO TABS
500.0000 mg | ORAL_TABLET | Freq: Once | ORAL | Status: AC
  Administered 2016-01-03: 500 mg via ORAL
  Filled 2016-01-03: qty 1
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
## 2016-01-03 NOTE — Discharge Instructions (Signed)
Shoulder Sprain °A shoulder sprain is a partial or complete tear in one of the tough, fiber-like tissues (ligaments) in the shoulder. The ligaments in the shoulder help to hold the shoulder in place. °CAUSES °This condition may be caused by: °· A fall. °· A hit to the shoulder. °· A twist of the arm. °RISK FACTORS °This condition is more likely to develop in: °· People who play sports. °· People who have problems with balance or coordination. °SYMPTOMS °Symptoms of this condition include: °· Pain when moving the shoulder. °· Limited ability to move the shoulder. °· Swelling and tenderness on top of the shoulder. °· Warmth in the shoulder. °· A change in the shape of the shoulder. °· Redness or bruising on the shoulder. °DIAGNOSIS °This condition is diagnosed with a physical exam. During the exam, you may be asked to do simple exercises with your shoulder. You may also have imaging tests, such as X-rays, MRI, or a CT scan. These tests can show how severe the sprain is. °TREATMENT °This condition may be treated with: °· Rest. °· Pain medicine. °· Ice. °· A sling or brace. This is used to keep the arm still while the shoulder is healing. °· Physical therapy or rehabilitation exercises. These help to improve the range of motion and strength of the shoulder. °· Surgery (rare). Surgery may be needed if the sprain caused a joint to become unstable. Surgery may also be needed to reduce pain. °Some people may develop ongoing shoulder pain or lose some range of motion in the shoulder. However, most people do not develop long-term problems. °HOME CARE INSTRUCTIONS °· Rest. °· Take over-the-counter and prescription medicines only as told by your health care provider. °· If directed, apply ice to the area: °¨ Put ice in a plastic bag. °¨ Place a towel between your skin and the bag. °¨ Leave the ice on for 20 minutes, 2-3 times per day. °· If you were given a shoulder sling or brace: °¨ Wear it as told. °¨ Remove it to shower or  bathe. °¨ Move your arm only as much as told by your health care provider, but keep your hand moving to prevent swelling. °· If you were shown how to do any exercises, do them as told by your health care provider. °· Keep all follow-up visits as told by your health care provider. This is important. °SEEK MEDICAL CARE IF: °· Your pain gets worse. °· Your pain is not relieved with medicines. °· You have increased redness or swelling. °SEEK IMMEDIATE MEDICAL CARE IF: °· You have a fever. °· You cannot move your arm or shoulder. °· You develop numbness or tingling in your arms, hands, or fingers. °  °This information is not intended to replace advice given to you by your health care provider. Make sure you discuss any questions you have with your health care provider. °  °Document Released: 02/24/2009 Document Revised: 06/29/2015 Document Reviewed: 01/31/2015 °Elsevier Interactive Patient Education ©2016 [employer] ° °
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
## 2016-01-04 NOTE — ED Notes (Signed)
Pt taken to family room.  Charge nurse notified.

## 2016-05-04 LAB — HM PAP SMEAR

## 2016-05-17 LAB — BASIC METABOLIC PANEL: CREATININE: 2 mg/dL — AB (ref <=1.1)

## 2016-05-17 LAB — HM MAMMOGRAPHY

## 2016-05-17 LAB — CBC AND DIFFERENTIAL: WBC: 2.4 10*3/mL

## 2016-07-27 ENCOUNTER — Encounter: Payer: Self-pay | Admitting: Internal Medicine

## 2016-07-27 ENCOUNTER — Ambulatory Visit (INDEPENDENT_AMBULATORY_CARE_PROVIDER_SITE_OTHER): Payer: Commercial Managed Care - HMO | Admitting: Internal Medicine

## 2016-07-27 DIAGNOSIS — N183 Chronic kidney disease, stage 3 unspecified: Secondary | ICD-10-CM

## 2016-07-27 DIAGNOSIS — C9110 Chronic lymphocytic leukemia of B-cell type not having achieved remission: Secondary | ICD-10-CM

## 2016-07-27 DIAGNOSIS — I10 Essential (primary) hypertension: Secondary | ICD-10-CM | POA: Diagnosis not present

## 2016-07-27 DIAGNOSIS — E119 Type 2 diabetes mellitus without complications: Secondary | ICD-10-CM

## 2016-07-27 DIAGNOSIS — B029 Zoster without complications: Secondary | ICD-10-CM

## 2016-07-27 DIAGNOSIS — M47819 Spondylosis without myelopathy or radiculopathy, site unspecified: Secondary | ICD-10-CM

## 2016-07-27 DIAGNOSIS — E7800 Pure hypercholesterolemia, unspecified: Secondary | ICD-10-CM

## 2016-07-27 NOTE — Patient Instructions (Signed)

## 2016-07-27 NOTE — Progress Notes (Signed)
HPI  Pt presents to the clinic today for follow up of chronic conditoins.  HTN: Her BP is well controlled. She takes Losartan, Hydralazine and Norvasc. Carvedilol was stopped at her last visit but restarted by the [place]. She reports she was just started on Hydralazine 3 days ago by the [place]. Her BP today is 126/70. She denies chest pain, chest tightness or shortness of breath. There is no ECG on file, but she has had one in the past with her previous provider.  DM 2: Her last A1C was 6.8% (05/2016) She does check her sugars. They range 90-140. She takes Glipizide. She reports the [place] put her back on Actos, after I had taken her off it during her last visit. She has an eye exam scheduled for 07/2016. She wants to get her flu shot at the [place]. She had Pneumovax 2015. She had Prevnar in 2016. She does check her feet frequently.  Recurrent Shingles: She takes Zovirax daily, no recent outbreak.  CKD: She reports she recently had her labs checked but did not bring a copy for me. She is not sure what her creatinine was. She follows with a nephrologist at the [place].   HLD: She reports she recently had her labs checked but did not bring a copy for me. She does not know what her cholesterol levels were. She takes Lipitor  And ASA daily. She denies myalgias. She does try to consume a low fat diet.  Arthritis in low back: This is an ongoing issue. She takes Tylenol Arthritis which does help.  CLL: She reports she underwent chemo since I last saw her. She is getting IG monthly. She complains mostly of fatigue. She does follow with an oncologist at the [place].    Past Medical History:  Diagnosis Date  . Chronic kidney disease   . Diabetes mellitus without complication   . History of leukemia   . Hypertension     Current Outpatient Prescriptions  Medication Sig Dispense Refill  . acyclovir (ZOVIRAX) 400 MG tablet     . amLODipine (NORVASC) 10 MG tablet Take 1 tablet (10 mg total) by mouth daily. 90 tablet 1  .  atorvastatin (LIPITOR) 10 MG tablet Take 1 tablet (10 mg total) by mouth daily. 90 tablet 0  . Calcium Carb-Cholecalciferol (OYSTER SHELL CALCIUM/VITAMIN D) 250-125 MG-UNIT TABS Take 1 tablet by mouth daily.     . Cyanocobalamin (VITAMIN B-12) 5000 MCG SUBL Place 1 tablet under the tongue daily.    Marland Kitchen glipiZIDE (GLUCOTROL) 5 MG tablet Take 5 mg by mouth daily.    Marland Kitchen losartan (COZAAR) 100 MG tablet Take 100 mg by mouth daily.    . Multiple Vitamins-Minerals (EYE SUPPORT) TABS Take 1 tablet by mouth daily. Occuvite     No current facility-administered medications for this visit.     Allergies  Allergen Reactions  . Lisinopril Cough    Family History  Problem Relation Age of Onset  . Hyperlipidemia Mother   . Diabetes Mother   . Diabetes Father   . Hyperlipidemia Father     Social History   Social History  . Marital status: Married    Spouse name: N/A  . Number of children: N/A  . Years of education: N/A   Occupational History  . Not on file.   Social History Main Topics  . Smoking status: Never Smoker  . Smokeless tobacco: Never Used  . Alcohol use No  . Drug use: No  . Sexual activity: Yes  Other Topics Concern  . Not on file   Social History Narrative  . No narrative on file    ROS:  Constitutional: Pt reports fatigue. Denies fever, malaise, headache or abrupt weight changes.  HEENT: Denies eye pain, eye redness, ear pain, ringing in the ears, wax buildup, runny nose, nasal congestion, bloody nose, or sore throat. Respiratory: Denies difficulty breathing, shortness of breath, cough or sputum production.   Cardiovascular: Denies chest pain, chest tightness, palpitations or swelling in the hands or feet.  Gastrointestinal: Denies abdominal pain, bloating, constipation, diarrhea or blood in the stool.  GU: Denies frequency, urgency, pain with urination, blood in urine, odor or discharge. Musculoskeletal: Pt reports low back pain. Denies difficulty with gait, muscle  pain or joint swelling.  Skin: Denies redness, rashes, lesions or ulcercations.  Neurological: Denies dizziness, difficulty with memory, difficulty with speech or problems with balance and coordination.  Psych: Denies anxiety, depression, SI/HI.  No other specific complaints in a complete review of systems (except as listed in HPI above).  PE:  BP 126/70 (BP Location: Left Arm, Patient Position: Sitting, Cuff Size: Normal)   Pulse 86   Temp 98.5 F (36.9 C) (Oral)   Wt 163 lb (73.9 kg)   SpO2 97%   BMI 31.83 kg/m   Wt Readings from Last 3 Encounters:  01/03/16 163 lb (73.9 kg)  07/19/15 157 lb 8 oz (71.4 kg)    General: Appears her stated age, obese in NAD. Skin: Warm, dry and intact. HEENT: Head: normal shape and size; Eyes: sclera white, no icterus, conjunctiva pink, PERRLA and EOMs intact; .  Cardiovascular: Normal rate and rhythm. S1,S2 noted.  No murmur, rubs or gallops noted. No JVD or BLE edema. No carotid bruits noted. Pulmonary/Chest: Normal effort and positive vesicular breath sounds. No respiratory distress. No wheezes, rales or ronchi noted.  Musculoskeletal: Normal flexion, extension and rotation of the spine. Mild pain with palpation over the lumbar spine.  Strength 5/5 BLE. No difficulty with gait.  Neurological: Alert and oriented. Sensation intact to BLE. Psychiatric: Mood and affect normal. Behavior is normal. Judgment and thought content normal.    Assessment and Plan:

## 2016-07-29 DIAGNOSIS — N183 Chronic kidney disease, stage 3 (moderate): Secondary | ICD-10-CM

## 2016-07-29 DIAGNOSIS — I120 Hypertensive chronic kidney disease with stage 5 chronic kidney disease or end stage renal disease: Secondary | ICD-10-CM | POA: Insufficient documentation

## 2016-07-29 MED ORDER — PIOGLITAZONE HCL 45 MG PO TABS: 45.0000 mg | ORAL_TABLET | Freq: Every day | ORAL | 3 refills | Status: DC

## 2016-07-29 NOTE — Assessment & Plan Note (Signed)
She will bring me a copy of her labs On Losartan for kidney protection Discussed the importance of tight BP and sugar control

## 2016-07-29 NOTE — Assessment & Plan Note (Signed)
She will bring me a copy of her most recent labs Encouraged her to consume a low fat, low carb diet  She will continue Glipizide and Actos She declines flu shot today Pneumovax and Prevnar UTD Foot exam today Advised her to continue yearly eye exams

## 2016-07-29 NOTE — Assessment & Plan Note (Signed)
She will continue to get monthly IgG  She will follow with oncology

## 2016-07-29 NOTE — Assessment & Plan Note (Signed)
She will bring me a copy of her most recent labs Encouraged her to consume a low fat diet Continue Lipitor and ASA

## 2016-07-29 NOTE — Assessment & Plan Note (Signed)
Continue daily Zovirax

## 2016-07-29 NOTE — Assessment & Plan Note (Signed)
Encouraged her to try to exercise to lose weight and strengthen her core

## 2016-07-29 NOTE — Assessment & Plan Note (Signed)
Controlled on Losartan, Hydralazine, Norvasc and Carvedilol She will bring a copy of her labs for me to review Will request a copy of most recent ECG from previous provider

## 2016-08-09 LAB — HEMOGLOBIN A1C: Hemoglobin A1C: 7.6

## 2016-09-04 LAB — HM DIABETES EYE EXAM

## 2016-09-18 ENCOUNTER — Telehealth: Payer: Self-pay | Admitting: Internal Medicine

## 2016-09-18 NOTE — Telephone Encounter (Signed)
Spouse dropped off lab results from [place] for regina

## 2016-09-20 NOTE — Telephone Encounter (Signed)
Noted, will review and scan into chart

## 2016-11-02 ENCOUNTER — Encounter: Payer: Self-pay | Admitting: Internal Medicine

## 2017-01-28 ENCOUNTER — Encounter: Payer: Commercial Managed Care - HMO | Admitting: Internal Medicine

## 2017-03-11 ENCOUNTER — Encounter: Payer: Self-pay | Admitting: Internal Medicine

## 2017-03-11 ENCOUNTER — Ambulatory Visit (INDEPENDENT_AMBULATORY_CARE_PROVIDER_SITE_OTHER): Payer: Medicare Other | Admitting: Internal Medicine

## 2017-03-11 VITALS — BP 122/60 | HR 85 | Temp 99.0°F | Ht 59.0 in | Wt 160.8 lb

## 2017-03-11 DIAGNOSIS — C9190 Lymphoid leukemia, unspecified not having achieved remission: Secondary | ICD-10-CM | POA: Diagnosis not present

## 2017-03-11 DIAGNOSIS — E119 Type 2 diabetes mellitus without complications: Secondary | ICD-10-CM | POA: Diagnosis not present

## 2017-03-11 DIAGNOSIS — M4690 Unspecified inflammatory spondylopathy, site unspecified: Secondary | ICD-10-CM | POA: Diagnosis not present

## 2017-03-11 DIAGNOSIS — N183 Chronic kidney disease, stage 3 unspecified: Secondary | ICD-10-CM

## 2017-03-11 DIAGNOSIS — B029 Zoster without complications: Secondary | ICD-10-CM | POA: Diagnosis not present

## 2017-03-11 DIAGNOSIS — M47819 Spondylosis without myelopathy or radiculopathy, site unspecified: Secondary | ICD-10-CM

## 2017-03-11 DIAGNOSIS — Z0000 Encounter for general adult medical examination without abnormal findings: Secondary | ICD-10-CM | POA: Diagnosis not present

## 2017-03-11 DIAGNOSIS — I10 Essential (primary) hypertension: Secondary | ICD-10-CM

## 2017-03-11 DIAGNOSIS — E7800 Pure hypercholesterolemia, unspecified: Secondary | ICD-10-CM | POA: Diagnosis not present

## 2017-03-11 DIAGNOSIS — C9110 Chronic lymphocytic leukemia of B-cell type not having achieved remission: Secondary | ICD-10-CM

## 2017-03-11 NOTE — Assessment & Plan Note (Signed)
Encouraged weight loss Continue Tylenol Arthritis

## 2017-03-11 NOTE — Assessment & Plan Note (Signed)
Creat 2.9 She will continue to follow with nephrology ? If she needs to be taken off Losartan

## 2017-03-11 NOTE — Assessment & Plan Note (Signed)
She does not want to restart Zovirax at this time

## 2017-03-11 NOTE — Assessment & Plan Note (Signed)
Controlled on Losartan, Amlodipine, Carvedilol and Hydralazine Will monitor

## 2017-03-11 NOTE — Assessment & Plan Note (Addendum)
She will continue to follow with nephrology. 

## 2017-03-11 NOTE — Assessment & Plan Note (Signed)
Will request record of A1C No microalbumin secondary to ARB therapy Foot exam today Encouraged yearly eye exams Continue Actos Will c/c Glipizide Continue to monitor sugars Immunizations UTD

## 2017-03-11 NOTE — Patient Instructions (Signed)
Health Maintenance, Female Adopting a healthy lifestyle and getting preventive care can go a long way to promote health and wellness. Talk with your health care provider about what schedule of regular examinations is right for you. This is a good chance for you to check in with your provider about disease prevention and staying healthy. In between checkups, there are plenty of things you can do on your own. Experts have done a lot of research about which lifestyle changes and preventive measures are most likely to keep you healthy. Ask your health care provider for more information. Weight and diet Eat a healthy diet  Be sure to include plenty of vegetables, fruits, low-fat dairy products, and lean protein.  Do not eat a lot of foods high in solid fats, added sugars, or salt.  Get regular exercise. This is one of the most important things you can do for your health.  Most adults should exercise for at least 150 minutes each week. The exercise should increase your heart rate and make you sweat (moderate-intensity exercise).  Most adults should also do strengthening exercises at least twice a week. This is in addition to the moderate-intensity exercise. Maintain a healthy weight  Body mass index (BMI) is a measurement that can be used to identify possible weight problems. It estimates body fat based on height and weight. Your health care provider can help determine your BMI and help you achieve or maintain a healthy weight.  For females 76 years of age and older:  A BMI below 18.5 is considered underweight.  A BMI of 18.5 to 24.9 is normal.  A BMI of 25 to 29.9 is considered overweight.  A BMI of 30 and above is considered obese. Watch levels of cholesterol and blood lipids  You should start having your blood tested for lipids and cholesterol at 67 years of age, then have this test every 5 years.  You may need to have your cholesterol levels checked more often if:  Your lipid or  cholesterol levels are high.  You are older than 67 years of age.  You are at high risk for heart disease. Cancer screening Lung Cancer  Lung cancer screening is recommended for adults 64-42 years old who are at high risk for lung cancer because of a history of smoking.  A yearly low-dose CT scan of the lungs is recommended for people who:  Currently smoke.  Have quit within the past 15 years.  Have at least a 30-pack-year history of smoking. A pack year is smoking an average of one pack of cigarettes a day for 1 year.  Yearly screening should continue until it has been 15 years since you quit.  Yearly screening should stop if you develop a health problem that would prevent you from having lung cancer treatment. Breast Cancer  Practice breast self-awareness. This means understanding how your breasts normally appear and feel.  It also means doing regular breast self-exams. Let your health care provider know about any changes, no matter how small.  If you are in your 20s or 30s, you should have a clinical breast exam (CBE) by a health care provider every 1-3 years as part of a regular health exam.  If you are 34 or older, have a CBE every year. Also consider having a breast X-ray (mammogram) every year.  If you have a family history of breast cancer, talk to your health care provider about genetic screening.  If you are at high risk for breast cancer, talk  to your health care provider about having an MRI and a mammogram every year.  Breast cancer gene (BRCA) assessment is recommended for women who have family members with BRCA-related cancers. BRCA-related cancers include:  Breast.  Ovarian.  Tubal.  Peritoneal cancers.  Results of the assessment will determine the need for genetic counseling and BRCA1 and BRCA2 testing. Cervical Cancer  Your health care provider may recommend that you be screened regularly for cancer of the pelvic organs (ovaries, uterus, and vagina).  This screening involves a pelvic examination, including checking for microscopic changes to the surface of your cervix (Pap test). You may be encouraged to have this screening done every 3 years, beginning at age 24.  For women ages 66-65, health care providers may recommend pelvic exams and Pap testing every 3 years, or they may recommend the Pap and pelvic exam, combined with testing for human papilloma virus (HPV), every 5 years. Some types of HPV increase your risk of cervical cancer. Testing for HPV may also be done on women of any age with unclear Pap test results.  Other health care providers may not recommend any screening for nonpregnant women who are considered low risk for pelvic cancer and who do not have symptoms. Ask your health care provider if a screening pelvic exam is right for you.  If you have had past treatment for cervical cancer or a condition that could lead to cancer, you need Pap tests and screening for cancer for at least 20 years after your treatment. If Pap tests have been discontinued, your risk factors (such as having a new sexual partner) need to be reassessed to determine if screening should resume. Some women have medical problems that increase the chance of getting cervical cancer. In these cases, your health care provider may recommend more frequent screening and Pap tests. Colorectal Cancer  This type of cancer can be detected and often prevented.  Routine colorectal cancer screening usually begins at 67 years of age and continues through 67 years of age.  Your health care provider may recommend screening at an earlier age if you have risk factors for colon cancer.  Your health care provider may also recommend using home test kits to check for hidden blood in the stool.  A small camera at the end of a tube can be used to examine your colon directly (sigmoidoscopy or colonoscopy). This is done to check for the earliest forms of colorectal cancer.  Routine  screening usually begins at age 41.  Direct examination of the colon should be repeated every 5-10 years through 67 years of age. However, you may need to be screened more often if early forms of precancerous polyps or small growths are found. Skin Cancer  Check your skin from head to toe regularly.  Tell your health care provider about any new moles or changes in moles, especially if there is a change in a mole's shape or color.  Also tell your health care provider if you have a mole that is larger than the size of a pencil eraser.  Always use sunscreen. Apply sunscreen liberally and repeatedly throughout the day.  Protect yourself by wearing long sleeves, pants, a wide-brimmed hat, and sunglasses whenever you are outside. Heart disease, diabetes, and high blood pressure  High blood pressure causes heart disease and increases the risk of stroke. High blood pressure is more likely to develop in:  People who have blood pressure in the high end of the normal range (130-139/85-89 mm Hg).  People who are overweight or obese.  People who are African American.  If you are 59-24 years of age, have your blood pressure checked every 3-5 years. If you are 34 years of age or older, have your blood pressure checked every year. You should have your blood pressure measured twice-once when you are at a hospital or clinic, and once when you are not at a hospital or clinic. Record the average of the two measurements. To check your blood pressure when you are not at a hospital or clinic, you can use:  An automated blood pressure machine at a pharmacy.  A home blood pressure monitor.  If you are between 29 years and 60 years old, ask your health care provider if you should take aspirin to prevent strokes.  Have regular diabetes screenings. This involves taking a blood sample to check your fasting blood sugar level.  If you are at a normal weight and have a low risk for diabetes, have this test once  every three years after 66 years of age.  If you are overweight and have a high risk for diabetes, consider being tested at a younger age or more often. Preventing infection Hepatitis B  If you have a higher risk for hepatitis B, you should be screened for this virus. You are considered at high risk for hepatitis B if:  You were born in a country where hepatitis B is common. Ask your health care provider which countries are considered high risk.  Your parents were born in a high-risk country, and you have not been immunized against hepatitis B (hepatitis B vaccine).  You have HIV or AIDS.  You use needles to inject street drugs.  You live with someone who has hepatitis B.  You have had sex with someone who has hepatitis B.  You get hemodialysis treatment.  You take certain medicines for conditions, including cancer, organ transplantation, and autoimmune conditions. Hepatitis C  Blood testing is recommended for:  Everyone born from [birth year] through 1966.  Anyone with known risk factors for hepatitis C. Sexually transmitted infections (STIs)  You should be screened for sexually transmitted infections (STIs) including gonorrhea and chlamydia if:  You are sexually active and are younger than 67 years of age.  You are older than 67 years of age and your health care provider tells you that you are at risk for this type of infection.  Your sexual activity has changed since you were last screened and you are at an increased risk for chlamydia or gonorrhea. Ask your health care provider if you are at risk.  If you do not have HIV, but are at risk, it may be recommended that you take a prescription medicine daily to prevent HIV infection. This is called pre-exposure prophylaxis (PrEP). You are considered at risk if:  You are sexually active and do not regularly use condoms or know the HIV status of your partner(s).  You take drugs by injection.  You are sexually active with a partner  who has HIV. Talk with your health care provider about whether you are at high risk of being infected with HIV. If you choose to begin PrEP, you should first be tested for HIV. You should then be tested every 3 months for as long as you are taking PrEP. Pregnancy  If you are premenopausal and you may become pregnant, ask your health care provider about preconception counseling.  If you may become pregnant, take 400 to 800 micrograms (mcg) of folic acid  every day.  If you want to prevent pregnancy, talk to your health care provider about birth control (contraception). Osteoporosis and menopause  Osteoporosis is a disease in which the bones lose minerals and strength with aging. This can result in serious bone fractures. Your risk for osteoporosis can be identified using a bone density scan.  If you are 4 years of age or older, or if you are at risk for osteoporosis and fractures, ask your health care provider if you should be screened.  Ask your health care provider whether you should take a calcium or vitamin D supplement to lower your risk for osteoporosis.  Menopause may have certain physical symptoms and risks.  Hormone replacement therapy may reduce some of these symptoms and risks. Talk to your health care provider about whether hormone replacement therapy is right for you. Follow these instructions at home:  Schedule regular health, dental, and eye exams.  Stay current with your immunizations.  Do not use any tobacco products including cigarettes, chewing tobacco, or electronic cigarettes.  If you are pregnant, do not drink alcohol.  If you are breastfeeding, limit how much and how often you drink alcohol.  Limit alcohol intake to no more than 1 drink per day for nonpregnant women. One drink equals 12 ounces of beer, 5 ounces of wine, or 1 ounces of hard liquor.  Do not use street drugs.  Do not share needles.  Ask your health care provider for help if you need support  or information about quitting drugs.  Tell your health care provider if you often feel depressed.  Tell your health care provider if you have ever been abused or do not feel safe at home. This information is not intended to replace advice given to you by your health care provider. Make sure you discuss any questions you have with your health care provider. Document Released: 04/23/2011 Document Revised: 03/15/2016 Document Reviewed: 07/12/2015 Elsevier Interactive Patient Education  2017 [employer].

## 2017-03-11 NOTE — Progress Notes (Signed)
HPI:  Pt presents to the clinic today for her Medicare Wellness Exam. She is also due to follow up chronic conditions.  HTN: Her BP today is 122/60. She is taking Losartan, Hydralazine, Amlodipine and Carvedilol as prescribed.  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 Her creatinine is 2.9. Her sugars range 90-120. She is prescribed Glipizide and Actos but reports she is only taking the Actos. She checks her feet daily. Her last eye exam was 08/2016.  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 She reports she has been taking a 7 day course, but not taking it daily.  HLD: She is not sure what her last levels were. She is taking Lipitor as prescribed. She denies myalgias. She tries to consume a low fat diet.  Arthritis: Mainly in her lower back. She takes Tylenol Arthritis with some relief.  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 She follows with the oncologist at the [facility], and has an appt next week.  Past Medical History:  Diagnosis Date  . Chronic kidney disease   . Diabetes mellitus without complication ([place])   . History of leukemia   . Hypertension     Current Outpatient Prescriptions  Medication Sig Dispense Refill  . acyclovir (ZOVIRAX) 400 MG tablet     . amLODipine (NORVASC) 10 MG tablet Take 1 tablet (10 mg total) by mouth daily. 90 tablet 1  . aspirin EC 81 MG tablet Take 81 mg by mouth daily.    Marland Kitchen atorvastatin (LIPITOR) 10 MG tablet Take 1 tablet (10 mg total) by mouth daily. (Patient taking differently: Take 20 mg by mouth daily. ) 90 tablet 0  . atorvastatin (LIPITOR) 80 MG tablet     . BLACK CURRANT SEED OIL PO Take 5 mLs by mouth.    . Calcium Carb-Cholecalciferol (OYSTER SHELL CALCIUM/VITAMIN D) 250-125 MG-UNIT TABS Take 1 tablet by mouth daily.     . carvedilol (COREG) 12.5 MG tablet Take 2 tablets by mouth 2 (two) times daily.    . carvedilol (COREG) 25 MG tablet     . ciprofloxacin (CIPRO) 250 MG tablet Take 250  mg by mouth 2 (two) times daily.    . furosemide (LASIX) 40 MG tablet     . glipiZIDE (GLUCOTROL) 5 MG tablet Take 2.5 mg by mouth 2 (two) times daily before a meal.     . hydrALAZINE (APRESOLINE) 25 MG tablet Take 25 mg by mouth 3 (three) times daily.    . hydrALAZINE (APRESOLINE) 50 MG tablet     . losartan (COZAAR) 100 MG tablet Take 100 mg by mouth daily.    Marland Kitchen losartan (COZAAR) 50 MG tablet     . Multiple Vitamins-Minerals (EYE SUPPORT) TABS Take 1 tablet by mouth daily. Occuvite    . pioglitazone (ACTOS) 15 MG tablet     . pioglitazone (ACTOS) 45 MG tablet Take 1 tablet (45 mg total) by mouth daily. 90 tablet 3  . Turmeric POWD by Does not apply route.     No current facility-administered medications for this visit.     Allergies  Allergen Reactions  . Lisinopril Cough    Family History  Problem Relation Age of Onset  . Hyperlipidemia Mother   . Diabetes Mother   . Diabetes Father   . Hyperlipidemia Father     Social History   Social History  . Marital status: Married    Spouse name: N/A  .  Number of children: N/A  . Years of education: N/A   Occupational History  . Not on file.   Social History Main Topics  . Smoking status: Never Smoker  . Smokeless tobacco: Never Used  . Alcohol use No  . Drug use: No  . Sexual activity: Yes   Other Topics Concern  . Not on file   Social History Narrative  . No narrative on file    Hospitiliaztions: None  Health Maintenance:    Flu: 07/2016  Tetanus: > 10 years ago  Pneumovax: 06/2014  Prevnar: 06/2015  Zostavax: 2015  Mammogram: 07/2016 at [facility]  Pap Smear: 04/2016, abnormal  Bone Density: 07/2016, normal  Colon Screening: 2009, [place] Doctor: yearly, last 08/2016  Dental Exam: as needed   Providers:   PCP: Webb Silversmith, NP-C  Nephrologist: Dr. Nevada Crane  Oncologist: Dr. Natale Lay  Dermatologist: Dr. Roselle Locus   I have personally reviewed and have noted:  1. The patient's medical and social  history 2. Their use of alcohol, tobacco or illicit drugs 3. Their current medications and supplements 4. The patient's functional ability including ADL's, fall risks, home safety risks and hearing or visual impairment. 5. Diet and physical activities 6. Evidence for depression or mood disorder  Subjective:   Review of Systems:   Constitutional: Denies fever, malaise, fatigue, headache or abrupt weight changes.  HEENT: Denies eye pain, eye redness, ear pain, ringing in the ears, wax buildup, runny nose, nasal congestion, bloody nose, or sore throat. Respiratory: Denies difficulty breathing, shortness of breath, cough or sputum production.   Cardiovascular: Denies chest pain, chest tightness, palpitations or swelling in the hands or feet.  Gastrointestinal: Denies abdominal pain, bloating, constipation, diarrhea or blood in the stool.  GU: Denies urgency, frequency, pain with urination, burning sensation, blood in urine, odor or discharge. Musculoskeletal: Pt reports intermittent low back pain. Denies decrease in range of motion, difficulty with gait, or joint pain and swelling.  Skin: Denies redness, rashes, lesions or ulcercations.  Neurological: Denies dizziness, difficulty with memory, difficulty with speech or problems with balance and coordination.  Psych: Denies anxiety, depression, SI/HI.  No other specific complaints in a complete review of systems (except as listed in HPI above).  Objective:  PE:   BP 122/60   Pulse 85   Temp 99 F (37.2 C) (Oral)   Ht 4\' 11"  (1.499 m)   Wt 160 lb 12 oz (72.9 kg)   LMP  (LMP Unknown)   SpO2 98%   BMI 32.47 kg/m   Wt Readings from Last 3 Encounters:  03/11/17 160 lb 12 oz (72.9 kg)  07/27/16 163 lb (73.9 kg)  07/19/15 157 lb 8 oz (71.4 kg)    General: Appears her stated age, obese in NAD. Skin: Warm, dry and intact. No ulcerations noted. Cardiovascular: Normal rate and rhythm. S1,S2 noted.  Murmur noted. No JVD or BLE edema. No  carotid bruits noted. Pulmonary/Chest: Normal effort and positive vesicular breath sounds. No respiratory distress. No wheezes, rales or ronchi noted.  Neurological: Alert and oriented. Sensation intact to BLE.  Psychiatric: Mood and affect normal. Behavior is normal. Judgment and thought content normal.     BMET    Component Value Date/Time   CREATININE 2.0 (A) 05/17/2016    Lipid Panel     Component Value Date/Time   CHOL 134 06/02/2015   TRIG 109 06/02/2015   HDL 43 06/02/2015   LDLCALC 69 06/02/2015    CBC  Component Value Date/Time   WBC 2.4 05/17/2016    Hgb A1C Lab Results  Component Value Date   HGBA1C 7.6 08/09/2016      Assessment and Plan:   Medicare Annual Wellness Visit:  Diet: She rarely eats lean meats. She is trying to eat more fruits and veggies. She tries to avoid fried foods. She drinks mostly sweet tea and water. Physical activity: She recently joined [employer], 2-3 times weeks. Depression/mood screen: Negative Hearing: Intact to whispered voice Visual acuity: Grossly normal, performs annual eye exam  ADLs: Capable Fall risk: None Home safety: Good Cognitive evaluation: Intact to orientation, naming, recall and repetition EOL planning: No adv directives, full code/ I agree  Preventative Medicine: She declines tetanus booster. Flu, tetanus, pneumovax, prevnar and zostovax UTD. She will get Shingrix through the [place]. Mammogram and pap smear scheduled. Bone density UTD. Encouraged her to consume a balanced diet and exercise regimen. Advised her to see an eye doctor and dentist annually. She gets all her labs through the [place], will request records.    Next appointment: 1 year, Medicare Wellness   Callaghan, Rollene Fare, NP

## 2017-03-11 NOTE — Assessment & Plan Note (Signed)
Encouraged her to consume a low fat diet Continue Lipitor for now

## 2017-03-14 ENCOUNTER — Encounter: Payer: Self-pay | Admitting: Internal Medicine

## 2017-05-20 ENCOUNTER — Telehealth: Payer: Self-pay | Admitting: *Deleted

## 2017-05-20 MED ORDER — VALACYCLOVIR HCL 500 MG PO TABS: 500.0000 mg | ORAL_TABLET | Freq: Two times a day (BID) | ORAL | 1 refills | Status: DC

## 2017-05-20 NOTE — Telephone Encounter (Signed)
Rx sent through [facility]  

## 2017-05-20 NOTE — Telephone Encounter (Signed)
Ok to send in [employer]

## 2017-05-20 NOTE — Addendum Note (Signed)
Addended by: Lurlean Nanny on: 05/20/2017 12:28 PM   Modules accepted: Orders

## 2017-05-20 NOTE — Telephone Encounter (Signed)
Patient called stating that she needs a new script sent to the pharmacy for Acyclovir 500 mg to take twice a day. Patient stated that she was on this while on chemo. 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 Pharmacy [facility]

## 2017-07-25 ENCOUNTER — Other Ambulatory Visit: Payer: Medicare Other

## 2017-07-25 DIAGNOSIS — E119 Type 2 diabetes mellitus without complications: Secondary | ICD-10-CM | POA: Diagnosis not present

## 2017-07-25 LAB — MICROALBUMIN / CREATININE URINE RATIO
Creatinine,U: 80.7 mg/dL
MICROALB UR: 86.5 mg/dL — AB (ref 0.0–1.9)
Microalb Creat Ratio: 107.2 mg/g — ABNORMAL HIGH (ref 0.0–30.0)

## 2017-11-26 ENCOUNTER — Ambulatory Visit (INDEPENDENT_AMBULATORY_CARE_PROVIDER_SITE_OTHER): Payer: Medicare Other | Admitting: Family Medicine

## 2017-11-26 ENCOUNTER — Encounter: Payer: Self-pay | Admitting: Family Medicine

## 2017-11-26 DIAGNOSIS — J0100 Acute maxillary sinusitis, unspecified: Secondary | ICD-10-CM

## 2017-11-26 MED ORDER — AMOXICILLIN-POT CLAVULANATE 875-125 MG PO TABS: 1.0000 | ORAL_TABLET | Freq: Two times a day (BID) | ORAL | 0 refills | Status: DC

## 2017-11-26 NOTE — Assessment & Plan Note (Signed)
Rest and fluids.  Start augmentin.  Update [place] as needed.  She agees.  Nontoxic.

## 2017-11-26 NOTE — Patient Instructions (Signed)
Rest and fluids.  Start augmentin.  Update [place] as needed.  Take care.  Glad to see you.

## 2017-11-26 NOTE — Progress Notes (Signed)
duration of symptoms: about 2 weeks  Rhinorrhea: yes congestion:yes ear pain: no sore throat:yes Cough: some, mild.   Myalgias: yes Fevers: no No vomiting or diarrhea.  No sputum.   Some HA.  Some LA in the neck recently noted.    Sugar 101 this AM, controlled per patient report.    Her port is out.  He had leukemia but no currently.  No tx currently.    Per HPI unless specifically indicated in ROS section   Meds, vitals, and allergies reviewed.   GEN: nad, alert and oriented HEENT: mucous membranes moist, TM w/o erythema, nasal epithelium injected, OP with cobblestoning NECK: supple w/mild LA B CV: rrr. PULM: ctab, no inc wob ABD: soft, +bs EXT: no edema R max sinus ttp.

## 2018-01-07 ENCOUNTER — Emergency Department
Admission: EM | Admit: 2018-01-07 | Discharge: 2018-01-07 | Disposition: A | Discharge status: Home / Self Care | Payer: No Typology Code available for payment source | Attending: Emergency Medicine | Admitting: Emergency Medicine

## 2018-01-07 ENCOUNTER — Emergency Department: Payer: No Typology Code available for payment source

## 2018-01-07 ENCOUNTER — Other Ambulatory Visit: Payer: Self-pay

## 2018-01-07 ENCOUNTER — Encounter: Payer: Self-pay | Admitting: Emergency Medicine

## 2018-01-07 DIAGNOSIS — I129 Hypertensive chronic kidney disease with stage 1 through stage 4 chronic kidney disease, or unspecified chronic kidney disease: Secondary | ICD-10-CM | POA: Insufficient documentation

## 2018-01-07 DIAGNOSIS — Y9389 Activity, other specified: Secondary | ICD-10-CM | POA: Diagnosis not present

## 2018-01-07 DIAGNOSIS — Z7982 Long term (current) use of aspirin: Secondary | ICD-10-CM | POA: Insufficient documentation

## 2018-01-07 DIAGNOSIS — E1122 Type 2 diabetes mellitus with diabetic chronic kidney disease: Secondary | ICD-10-CM | POA: Insufficient documentation

## 2018-01-07 DIAGNOSIS — Z79899 Other long term (current) drug therapy: Secondary | ICD-10-CM | POA: Diagnosis not present

## 2018-01-07 DIAGNOSIS — S199XXA Unspecified injury of neck, initial encounter: Secondary | ICD-10-CM | POA: Diagnosis not present

## 2018-01-07 DIAGNOSIS — N183 Chronic kidney disease, stage 3 (moderate): Secondary | ICD-10-CM | POA: Diagnosis not present

## 2018-01-07 DIAGNOSIS — I10 Essential (primary) hypertension: Secondary | ICD-10-CM | POA: Diagnosis not present

## 2018-01-07 DIAGNOSIS — S161XXA Strain of muscle, fascia and tendon at neck level, initial encounter: Secondary | ICD-10-CM

## 2018-01-07 DIAGNOSIS — Y92481 Parking lot as the place of occurrence of the external cause: Secondary | ICD-10-CM | POA: Diagnosis not present

## 2018-01-07 DIAGNOSIS — S0990XA Unspecified injury of head, initial encounter: Secondary | ICD-10-CM | POA: Diagnosis not present

## 2018-01-07 DIAGNOSIS — Y998 Other external cause status: Secondary | ICD-10-CM | POA: Diagnosis not present

## 2018-01-07 DIAGNOSIS — S1989XA Other specified injuries of other specified part of neck, initial encounter: Secondary | ICD-10-CM | POA: Diagnosis present

## 2018-01-07 LAB — BASIC METABOLIC PANEL
Anion gap: 12 (ref 5–15)
BUN: 48 mg/dL — ABNORMAL HIGH (ref 6–20)
CHLORIDE: 109 mmol/L (ref 101–111)
CO2: 18 mmol/L — AB (ref 22–32)
CREATININE: 2.9 mg/dL — AB (ref 0.44–1.00)
Calcium: 8.3 mg/dL — ABNORMAL LOW (ref 8.9–10.3)
GFR calc Af Amer: 18 mL/min — ABNORMAL LOW (ref >60)
GFR calc non Af Amer: 16 mL/min — ABNORMAL LOW (ref >60)
GLUCOSE: 225 mg/dL — AB (ref 65–99)
POTASSIUM: 3.1 mmol/L — AB (ref 3.5–5.1)
SODIUM: 139 mmol/L (ref 135–145)

## 2018-01-07 LAB — CBC WITH DIFFERENTIAL/PLATELET
BASOS PCT: 0 %
Basophils Absolute: 0.2 10*3/uL — ABNORMAL HIGH (ref 0–0.1)
Eosinophils Absolute: 0.6 10*3/uL (ref 0–0.7)
Eosinophils Relative: 1 %
HEMATOCRIT: 35.7 % (ref 35.0–47.0)
Hemoglobin: 11.5 g/dL — ABNORMAL LOW (ref 12.0–16.0)
LYMPHS ABS: 67.4 10*3/uL — AB (ref 1.0–3.6)
Lymphocytes Relative: 86 %
MCH: 27 pg (ref 26.0–34.0)
MCHC: 32.2 g/dL (ref 32.0–36.0)
MCV: 84 fL (ref 80.0–100.0)
MONO ABS: 1.5 10*3/uL — AB (ref 0.2–0.9)
MONOS PCT: 2 %
NEUTROS ABS: 8.9 10*3/uL — AB (ref 1.4–6.5)
Neutrophils Relative %: 11 %
Platelets: 110 10*3/uL — ABNORMAL LOW (ref 150–440)
RBC: 4.25 MIL/uL (ref 3.80–5.20)
RDW: 17.6 % — ABNORMAL HIGH (ref 11.5–14.5)
WBC: 78.6 10*3/uL (ref 3.6–11.0)

## 2018-01-07 LAB — TROPONIN I: Troponin I: <0.03 ng/mL (ref <0.03)
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
## 2018-01-07 MED ORDER — ACETAMINOPHEN 500 MG PO TABS
1000.0000 mg | ORAL_TABLET | Freq: Once | ORAL | Status: AC
  Administered 2018-01-07: 1000 mg via ORAL
  Filled 2018-01-07: qty 2
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
## 2018-01-07 NOTE — ED Notes (Signed)
Date and time results received: 03/19/191313 (use smartphrase ".now" to insert current time)  Test: WBC Critical Value: 78.6  Name of Provider Notified: MD Alfred Levins  Orders Received? Or Actions Taken?: Orders Received - See Orders for details
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
## 2019-01-12 ENCOUNTER — Other Ambulatory Visit: Payer: Self-pay

## 2019-01-12 ENCOUNTER — Encounter (HOSPITAL_COMMUNITY): Payer: Self-pay

## 2019-01-12 ENCOUNTER — Emergency Department (HOSPITAL_COMMUNITY): Payer: Medicare PPO

## 2019-01-12 ENCOUNTER — Emergency Department (HOSPITAL_COMMUNITY)
Admission: EM | Admit: 2019-01-12 | Discharge: 2019-01-12 | Disposition: A | Discharge status: Home / Self Care | Payer: Medicare PPO | Attending: Emergency Medicine | Admitting: Emergency Medicine

## 2019-01-12 DIAGNOSIS — R112 Nausea with vomiting, unspecified: Secondary | ICD-10-CM | POA: Diagnosis not present

## 2019-01-12 DIAGNOSIS — Z79899 Other long term (current) drug therapy: Secondary | ICD-10-CM | POA: Diagnosis not present

## 2019-01-12 DIAGNOSIS — R61 Generalized hyperhidrosis: Secondary | ICD-10-CM | POA: Diagnosis not present

## 2019-01-12 DIAGNOSIS — E1122 Type 2 diabetes mellitus with diabetic chronic kidney disease: Secondary | ICD-10-CM | POA: Insufficient documentation

## 2019-01-12 DIAGNOSIS — Z7984 Long term (current) use of oral hypoglycemic drugs: Secondary | ICD-10-CM | POA: Diagnosis not present

## 2019-01-12 DIAGNOSIS — I10 Essential (primary) hypertension: Secondary | ICD-10-CM | POA: Diagnosis not present

## 2019-01-12 DIAGNOSIS — R42 Dizziness and giddiness: Secondary | ICD-10-CM | POA: Diagnosis not present

## 2019-01-12 DIAGNOSIS — N184 Chronic kidney disease, stage 4 (severe): Secondary | ICD-10-CM | POA: Insufficient documentation

## 2019-01-12 DIAGNOSIS — R1110 Vomiting, unspecified: Secondary | ICD-10-CM | POA: Diagnosis not present

## 2019-01-12 DIAGNOSIS — D72820 Lymphocytosis (symptomatic): Secondary | ICD-10-CM | POA: Diagnosis not present

## 2019-01-12 DIAGNOSIS — R531 Weakness: Secondary | ICD-10-CM | POA: Diagnosis not present

## 2019-01-12 DIAGNOSIS — I129 Hypertensive chronic kidney disease with stage 1 through stage 4 chronic kidney disease, or unspecified chronic kidney disease: Secondary | ICD-10-CM | POA: Diagnosis not present

## 2019-01-12 LAB — CBC WITH DIFFERENTIAL/PLATELET
Abs Immature Granulocytes: 0.07 10*3/uL (ref 0.00–0.07)
BASOS ABS: 0.1 10*3/uL (ref 0.0–0.1)
BASOS PCT: 1 %
Eosinophils Absolute: 0.3 10*3/uL (ref 0.0–0.5)
Eosinophils Relative: 2 %
HCT: 30.6 % — ABNORMAL LOW (ref 36.0–46.0)
Hemoglobin: 9.7 g/dL — ABNORMAL LOW (ref 12.0–15.0)
IMMATURE GRANULOCYTES: 1 %
Lymphocytes Relative: 66 %
Lymphs Abs: 7.4 10*3/uL — ABNORMAL HIGH (ref 0.7–4.0)
MCH: 25.4 pg — AB (ref 26.0–34.0)
MCHC: 31.7 g/dL (ref 30.0–36.0)
MCV: 80.1 fL (ref 80.0–100.0)
MONO ABS: 0.4 10*3/uL (ref 0.1–1.0)
Monocytes Relative: 3 %
NEUTROS PCT: 27 %
NRBC: 0 % (ref 0.0–0.2)
Neutro Abs: 2.9 10*3/uL (ref 1.7–7.7)
PLATELETS: 81 10*3/uL — AB (ref 150–400)
RBC: 3.82 MIL/uL — AB (ref 3.87–5.11)
RDW: 17.9 % — AB (ref 11.5–15.5)
WBC: 11.1 10*3/uL — AB (ref 4.0–10.5)

## 2019-01-12 LAB — URINALYSIS, ROUTINE W REFLEX MICROSCOPIC
BILIRUBIN URINE: NEGATIVE
Glucose, UA: 150 mg/dL — AB
Hgb urine dipstick: NEGATIVE
Ketones, ur: NEGATIVE mg/dL
Leukocytes,Ua: NEGATIVE
Nitrite: NEGATIVE
Protein, ur: >=300 mg/dL — AB
Specific Gravity, Urine: 1.007 (ref 1.005–1.030)
pH: 7 (ref 5.0–8.0)

## 2019-01-12 LAB — COMPREHENSIVE METABOLIC PANEL
ALT: 10 U/L (ref 0–44)
ANION GAP: 13 (ref 5–15)
AST: 26 U/L (ref 15–41)
Albumin: 3.3 g/dL — ABNORMAL LOW (ref 3.5–5.0)
Alkaline Phosphatase: 53 U/L (ref 38–126)
BILIRUBIN TOTAL: 0.7 mg/dL (ref 0.3–1.2)
BUN: 34 mg/dL — AB (ref 8–23)
CO2: 18 mmol/L — ABNORMAL LOW (ref 22–32)
Calcium: 8.7 mg/dL — ABNORMAL LOW (ref 8.9–10.3)
Chloride: 106 mmol/L (ref 98–111)
Creatinine, Ser: 4.17 mg/dL — ABNORMAL HIGH (ref 0.44–1.00)
GFR, EST AFRICAN AMERICAN: 12 mL/min — AB (ref >60)
GFR, EST NON AFRICAN AMERICAN: 10 mL/min — AB (ref >60)
Glucose, Bld: 229 mg/dL — ABNORMAL HIGH (ref 70–99)
Potassium: 3.3 mmol/L — ABNORMAL LOW (ref 3.5–5.1)
Sodium: 137 mmol/L (ref 135–145)
TOTAL PROTEIN: 5.6 g/dL — AB (ref 6.5–8.1)

## 2019-01-12 LAB — LIPASE, BLOOD: Lipase: 53 U/L — ABNORMAL HIGH (ref 11–51)

## 2019-01-12 LAB — PHOSPHORUS: PHOSPHORUS: 3.7 mg/dL (ref 2.5–4.6)

## 2019-01-12 IMAGING — DX PORTABLE CHEST - 1 VIEW
1 series · 1 of 1 positions shown · non-contrast
Comparison: None.

CLINICAL DATA: Nausea and vomiting

EXAM:
PORTABLE CHEST 1 VIEW

[chest ap]
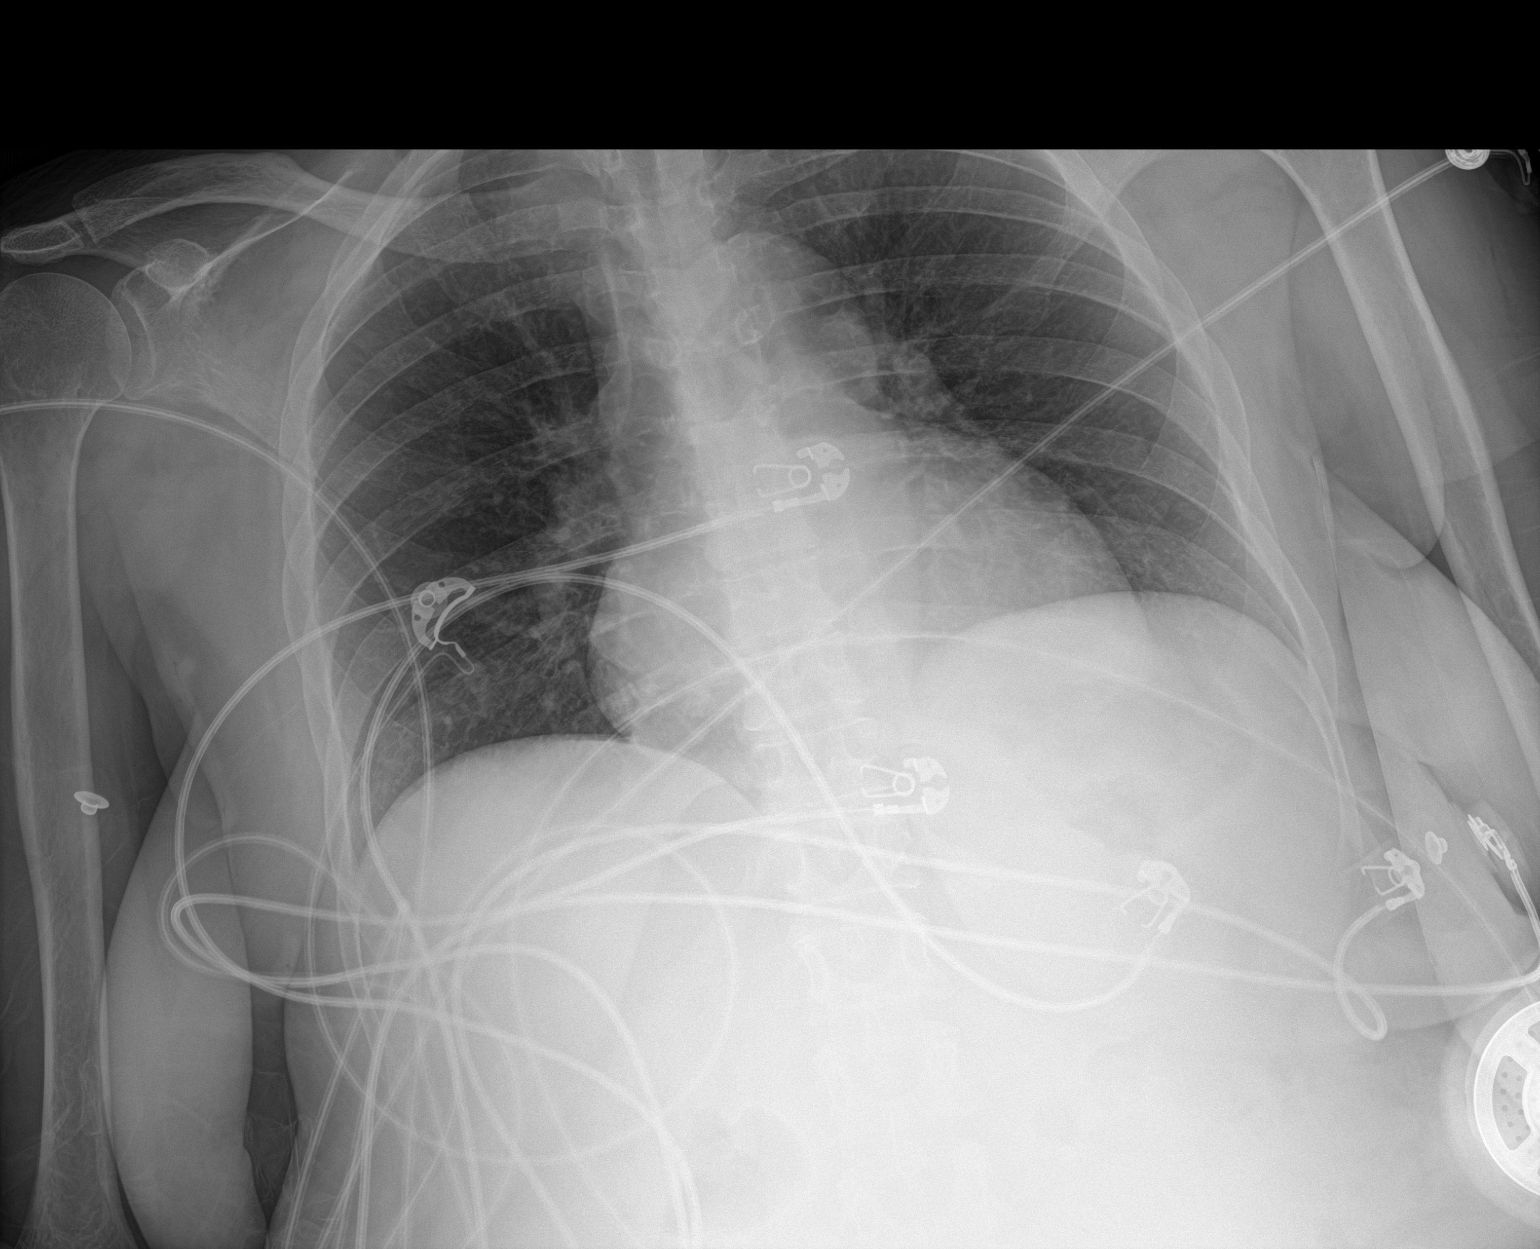

[1 of 1 positions shown; findings below may reference images not displayed]

FINDINGS: Cardiac shadows within normal limits. The lungs are clear
bilaterally. No focal infiltrate or sizable effusion is seen. No
bony abnormality is noted.
IMPRESSION: No acute abnormality noted.

## 2019-01-12 IMAGING — CT CT HEAD WITHOUT CONTRAST
4 series · 17 of 47 positions shown, 19 images · non-contrast
Comparison: [DATE]

CLINICAL DATA: Vomiting, diaphoresis, weakness

EXAM:
CT HEAD WITHOUT CONTRAST
TECHNIQUE: Contiguous axial images were obtained from the base of the skull
through the vertex without intravenous contrast.

[Series 3: head without · axial · non-contrast · 0.39mm/px · z∈[-73,+42]mm · 7 of 31 slices shown, 9 images]
[im 4/31  brain]
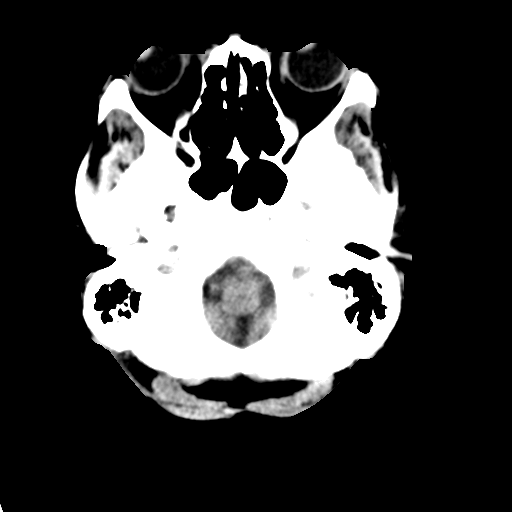
[im 4/31  bone]
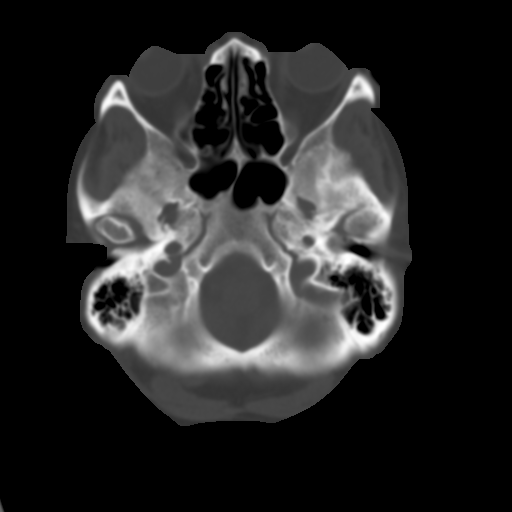
[im 8/31  brain]
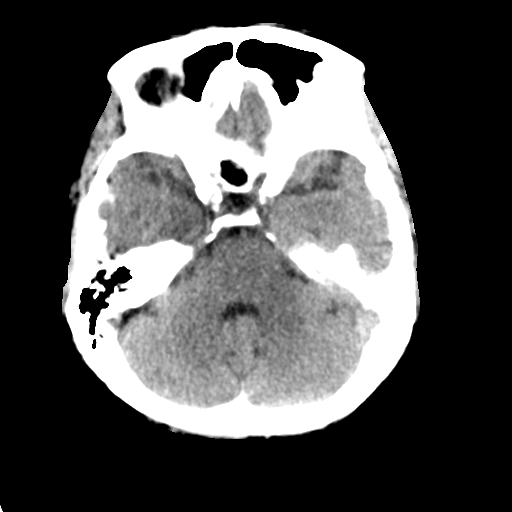
[im 12/31  brain]
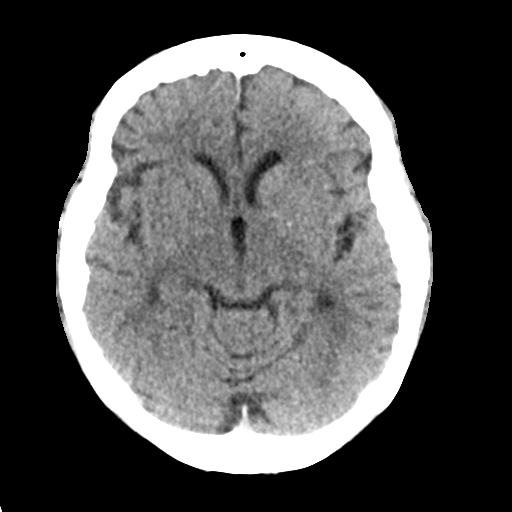
[im 16/31  brain]
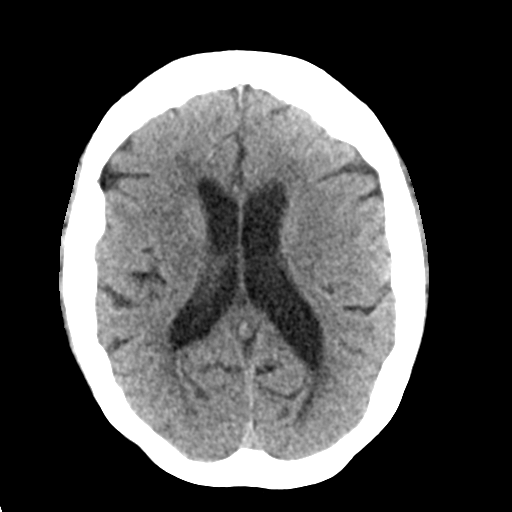
[im 19/31  brain]
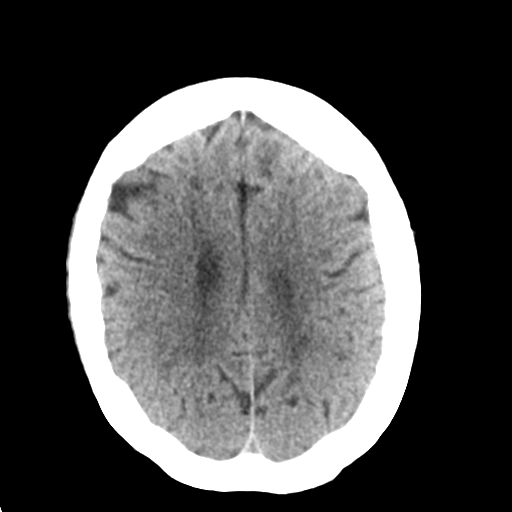
[im 19/31  bone]
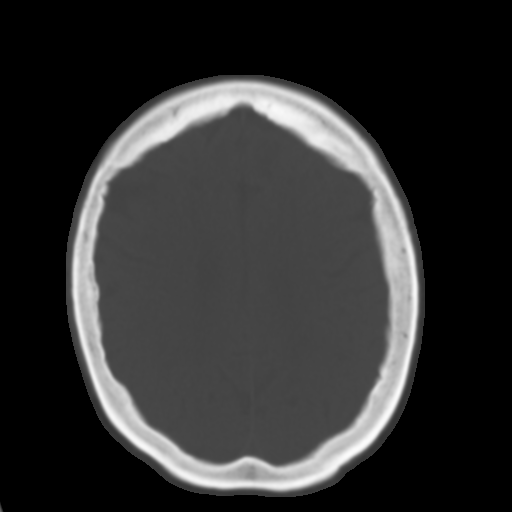
[im 23/31  brain]
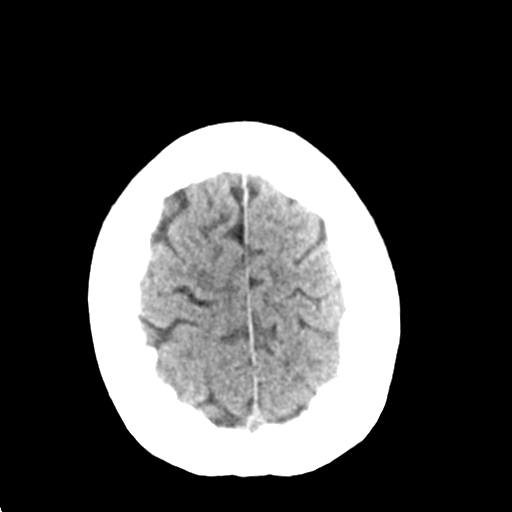
[im 27/31  brain]
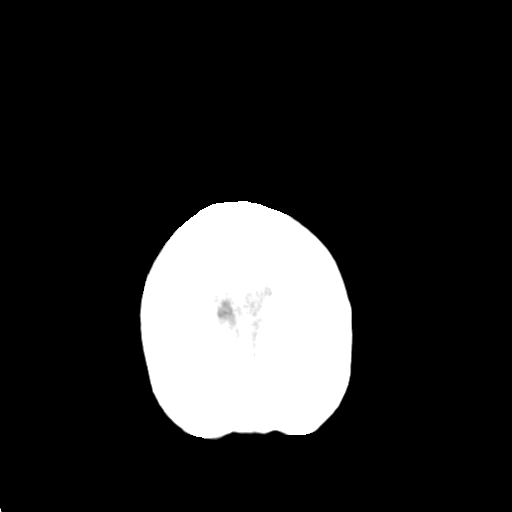

[Series 4: head bone · axial · 0.39mm/px · z∈[-74,-22]mm · 4 of 76 slices shown]
[im 8/76  bone]
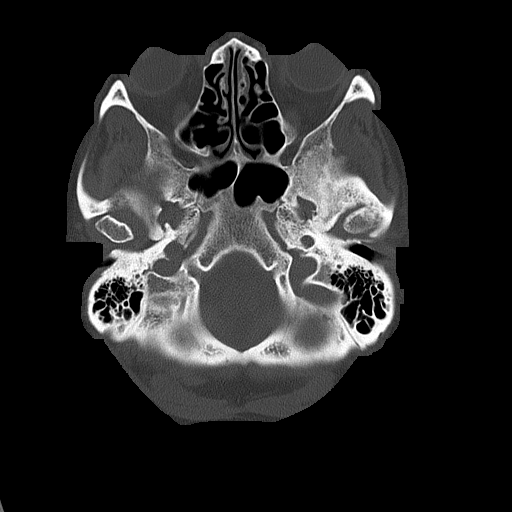
[im 16/76  bone]
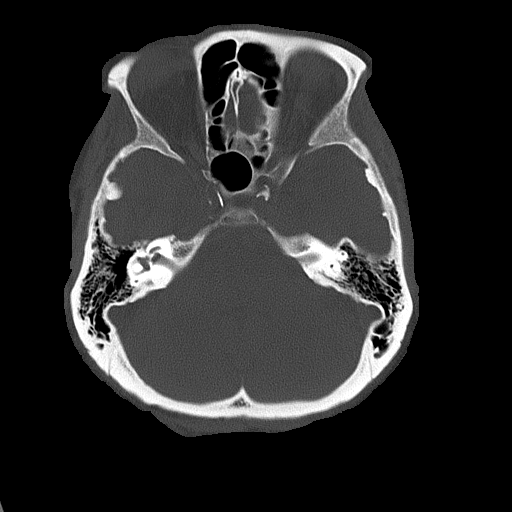
[im 23/76  bone]
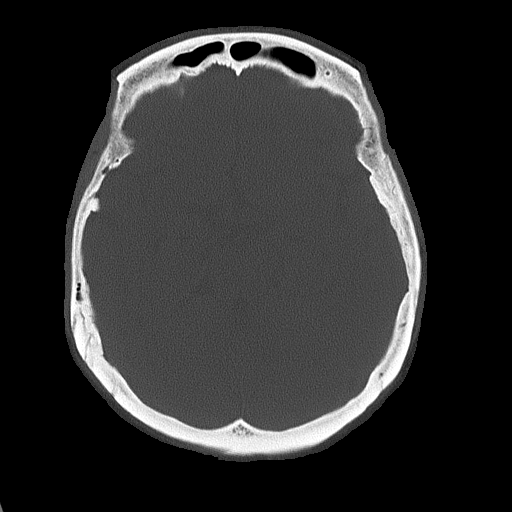
[im 34/76  bone]
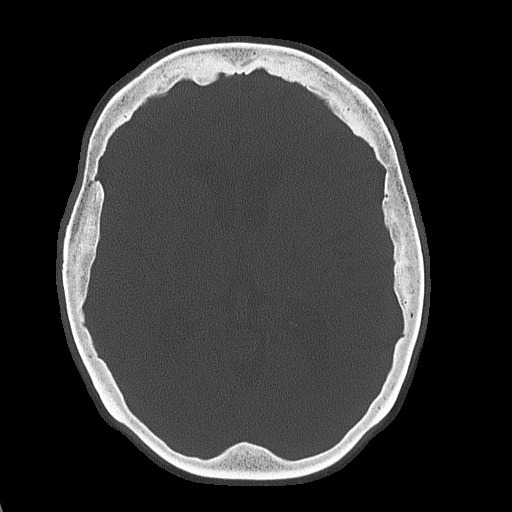

[Series 5: head without cor · coronal · non-contrast · 0.30mm/px · 3 of 60 slices shown]
[im 20/60  brain]
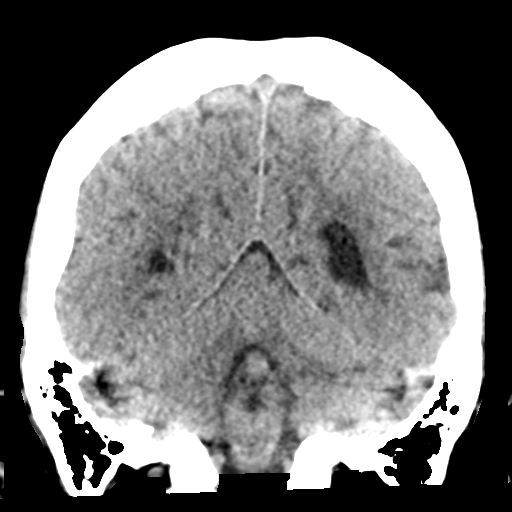
[im 27/60  brain]
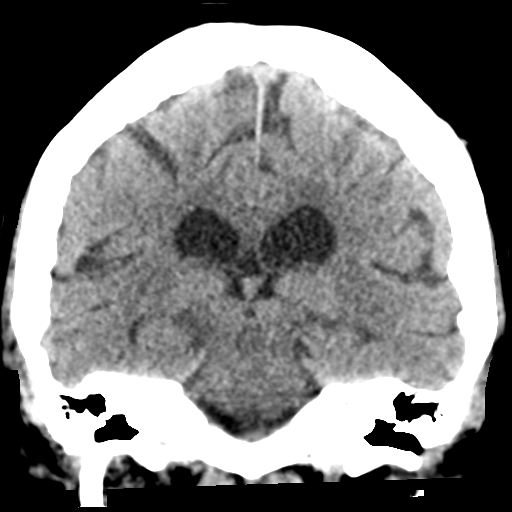
[im 33/60  brain]
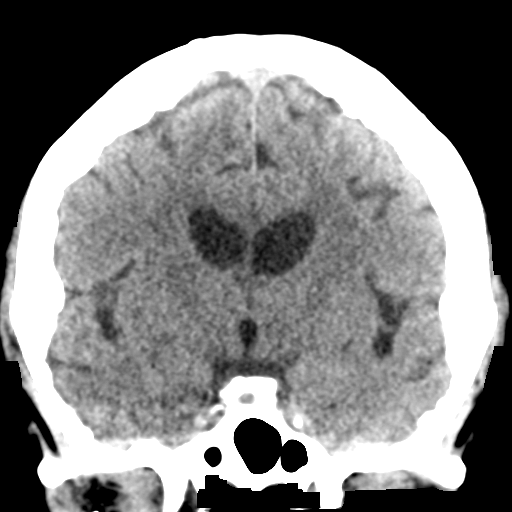

[Series 6: head without sag · sagittal · non-contrast · 0.30mm/px · 3 of 51 slices shown]
[im 17/51  brain]
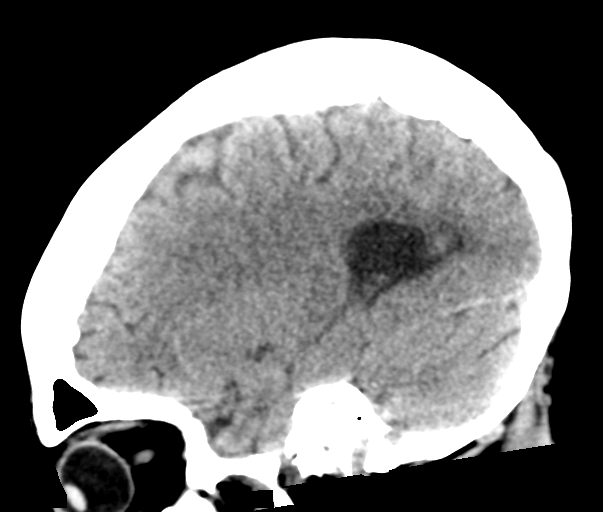
[im 26/51  brain]
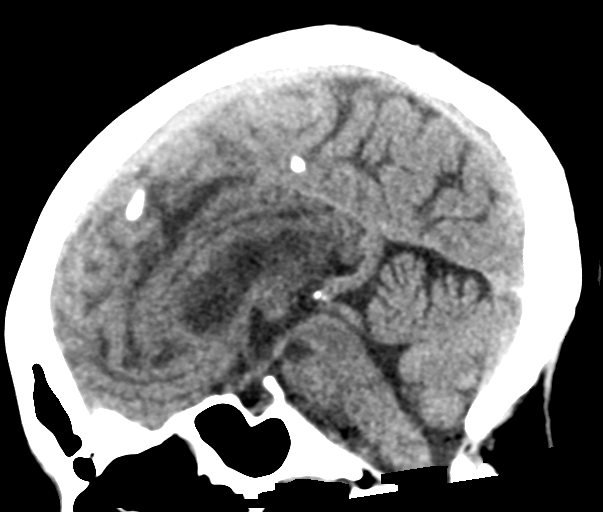
[im 34/51  brain]
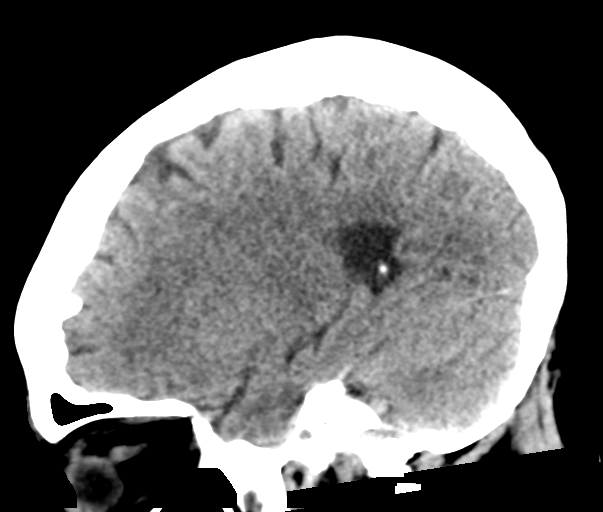

[17 of 47 positions shown; findings below may reference images not displayed]

FINDINGS: Brain: No evidence of acute infarction, hemorrhage, hydrocephalus,
extra-axial collection or mass lesion/mass effect.

Vascular: No hyperdense vessel or unexpected calcification.

Skull: Normal. Negative for fracture or focal lesion.

Sinuses/Orbits: No acute finding.

Other: None.
IMPRESSION: No acute intracranial pathology.

## 2019-01-12 MED ORDER — ONDANSETRON HCL 4 MG PO TABS: 4.0000 mg | ORAL_TABLET | Freq: Every day | ORAL | 1 refills | Status: AC | PRN

## 2019-01-12 MED ORDER — MECLIZINE HCL 25 MG PO TABS: 25.0000 mg | ORAL_TABLET | Freq: Three times a day (TID) | ORAL | 0 refills | Status: DC | PRN

## 2019-01-12 MED ORDER — ONDANSETRON HCL 4 MG/2ML IJ SOLN
4.0000 mg | Freq: Once | INTRAMUSCULAR | Status: AC
  Administered 2019-01-12: 4 mg via INTRAVENOUS
  Filled 2019-01-12: qty 2

## 2019-01-12 MED ORDER — HYDRALAZINE HCL 25 MG PO TABS
25.0000 mg | ORAL_TABLET | Freq: Three times a day (TID) | ORAL | Status: DC
  Administered 2019-01-12: 25 mg via ORAL
  Filled 2019-01-12: qty 1

## 2019-01-12 MED ORDER — FOSFOMYCIN TROMETHAMINE 3 G PO PACK
3.0000 g | PACK | Freq: Once | ORAL | Status: AC
  Administered 2019-01-12: 3 g via ORAL
  Filled 2019-01-12: qty 3

## 2019-01-12 MED ORDER — MECLIZINE HCL 25 MG PO TABS
25.0000 mg | ORAL_TABLET | Freq: Once | ORAL | Status: AC
  Administered 2019-01-12: 25 mg via ORAL
  Filled 2019-01-12: qty 1

## 2019-01-12 MED ORDER — POTASSIUM CHLORIDE 20 MEQ PO PACK
20.0000 meq | PACK | Freq: Every day | ORAL | Status: DC
  Administered 2019-01-12: 20 meq via ORAL
  Filled 2019-01-12: qty 1

## 2019-01-12 MED ORDER — POTASSIUM CHLORIDE CRYS ER 20 MEQ PO TBCR
20.0000 meq | EXTENDED_RELEASE_TABLET | Freq: Once | ORAL | Status: DC
  Filled 2019-01-12: qty 1

## 2019-01-12 MED ORDER — SODIUM CHLORIDE 0.9 % IV BOLUS
1000.0000 mL | Freq: Once | INTRAVENOUS | Status: AC
  Administered 2019-01-12: 1000 mL via INTRAVENOUS

## 2019-01-12 MED ORDER — CARVEDILOL 12.5 MG PO TABS
12.5000 mg | ORAL_TABLET | Freq: Once | ORAL | Status: AC
  Administered 2019-01-12: 12.5 mg via ORAL
  Filled 2019-01-12: qty 1

## 2019-01-12 NOTE — ED Notes (Signed)
Iv not infusing , flushes but will not run. Iv attempted at left ac withoput success.

## 2019-01-12 NOTE — ED Notes (Signed)
Pt stats she understands instructions . Home stable via wc with husband.

## 2019-01-12 NOTE — Discharge Instructions (Addendum)
Dear Christine Gibson  You came to [place] with dizziness. We have determined this was caused by vertigo. Here are our recommendations for you at discharge:  Please take meclizine 25mg  as needed for dizziness Please take zofran 8 mg as needed for nausea  Thank you for choosing [facility]

## 2019-01-12 NOTE — ED Notes (Signed)
Pt unable to urinate to provide sample for UA at this time, will reapproach after fluid bolus

## 2019-01-12 NOTE — ED Triage Notes (Signed)
Pt arrives EMS from home with c/o vomiting, diaphoresis ansd weakness and dizziness. 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 Famiily reports 30 lb weight loss over last 2 months. Given 4mg  zofron  im PTA.

## 2019-01-12 NOTE — ED Notes (Signed)
MSD aware that iv fluids have not infused.

## 2019-01-12 NOTE — ED Provider Notes (Signed)
[place] EMERGENCY DEPARTMENT Provider Note   CSN: [id number] Arrival date & time: 01/12/19  1540  History   Chief Complaint Chief Complaint  Patient presents with  . Weakness  . Emesis  . Dizziness   HPI Christine Gibson is a 68 y.o. female w/ PMH of CLL s/p chemo and IVIG, T2DM, CKD4, HTN and HLD who presents with weakness, nausea and vomiting. She was in her usual state of health until early this morning when she woke up with malaise. She got up to walk to the bathroom and started seeing having 'room spin' and felt acutely weak. She started to feel nauseous and started to have NBNB emesis as well. She also mentions that she had 1 episode of diarrhea this morning. She recalls no acute inciting event, no sick contact, no changes in diet.   Spoke with her husband on the phone who mentions no significant event last night leading up to onset of symptoms. He provides additional history that she was significantly diaphoretic this morning as well. She has hx of CKD and diabetes but currently not on insulin. He also mentions she has been steadily losing weight (about 30 lbs over the last 2 months).  Past Medical History:  Diagnosis Date  . Chronic kidney disease   . Diabetes mellitus without complication ([place])   . History of leukemia   . Hypertension     Patient Active Problem List   Diagnosis Date Noted  . Acute non-recurrent maxillary sinusitis 11/26/2017  . CKD (chronic kidney disease), stage III ([place]) 07/29/2016  . HLD (hyperlipidemia) 07/19/2015  . HTN (hypertension) 07/19/2015  . DM type 2 (diabetes mellitus, type 2) ([place]) 07/19/2015  . Shingles 07/19/2015  . Arthritis, low back 07/19/2015  . CLL (chronic lymphocytic leukemia) ([place])     Past Surgical History:  Procedure Laterality Date  . ABDOMINAL HYSTERECTOMY  1991   partial  . CESAREAN SECTION     x 3     OB History   No obstetric history on file.      Home Medications    Prior to  Admission medications   Medication Sig Start Date End Date Taking? Authorizing Provider  APPLE CIDER VINEGAR PO Take 15 mLs by mouth daily.   Yes [provider]  calcitRIOL (ROCALTROL) 0.25 MCG capsule Take 0.25 mcg by mouth See admin instructions. Tale on Thursday and Saturday   Yes [provider]  Cyanocobalamin (VITAMIN B-12) 5000 MCG TBDP Take 5,000 mcg by mouth daily.   Yes [provider]  ferrous sulfate 325 (65 FE) MG EC tablet Take 325 mg by mouth every Monday, Wednesday, and Friday.   Yes [provider]  furosemide (LASIX) 40 MG tablet Take 40 mg by mouth 2 (two) times daily.  02/19/17  Yes [provider]  glipiZIDE (GLUCOTROL) 5 MG tablet Take 5 mg by mouth daily before breakfast.   Yes [provider]  hydrALAZINE (APRESOLINE) 25 MG tablet Take 25 mg by mouth 3 (three) times daily.  02/08/17  Yes [provider]  ibrutinib (IMBRUVICA) 140 MG capsul Take 140 mg by mouth daily.   Yes [provider]  losartan (COZAAR) 50 MG tablet Take 50 mg by mouth daily.    Yes [provider]  NIFEdipine (ADALAT CC) 60 MG 24 hr tablet Take 60 mg by mouth daily.   Yes [provider]  OVER THE COUNTER MEDICATION Take 3 tablets by mouth daily.   Yes [provider]  OVER THE COUNTER MEDICATION Take 1 tablet by mouth daily. Tree of life   Yes [provider]  potassium chloride (KLOR-CON) 20 MEQ packet Take 20 mEq by mouth daily.   Yes [provider]  Turmeric POWD by Does not apply route. Cook with it   Yes [provider]  valACYclovir (VALTREX) 500 MG tablet Take 1 tablet (500 mg total) by mouth 2 (two) times daily. Patient taking differently: Take 400 mg by mouth 2 (two) times daily.  05/20/17  Yes Jearld Fenton, NP  amLODipine (NORVASC) 10 MG tablet Take 1 tablet (10 mg total) by mouth daily. Patient not taking: Reported on 01/12/2019 07/19/15   Jearld Fenton, NP   amoxicillin-clavulanate (AUGMENTIN) 875-125 MG tablet Take 1 tablet by mouth 2 (two) times daily. Patient not taking: Reported on 01/12/2019 11/26/17   Tonia Ghent, MD  meclizine (ANTIVERT) 25 MG tablet Take 1 tablet (25 mg total) by mouth 3 (three) times daily as needed for dizziness. 01/12/19   Mosetta Anis, MD  Multiple Vitamins-Minerals (EYE SUPPORT) TABS Take 1 tablet by mouth daily. Occuvite    [provider]  ondansetron (ZOFRAN) 4 MG tablet Take 1 tablet (4 mg total) by mouth daily as needed for nausea or vomiting. 01/12/19 01/12/20  Mosetta Anis, MD    Family History Family History  Problem Relation Age of Onset  . Hyperlipidemia Mother   . Diabetes Mother   . Diabetes Father   . Hyperlipidemia Father     Social History Social History   Tobacco Use  . Smoking status: Never Smoker  . Smokeless tobacco: Never Used  Substance Use Topics  . Alcohol use: Yes    Alcohol/week: 0.0 standard drinks    Comment: occassional  . Drug use: No     Allergies   Lisinopril   Review of Systems Review of Systems  Constitutional: Positive for diaphoresis. Negative for chills and fever.  Eyes: Positive for photophobia. Negative for visual disturbance.  Respiratory: Negative for shortness of breath and wheezing.   Cardiovascular: Negative for chest pain and palpitations.  Gastrointestinal: Positive for diarrhea, nausea and vomiting. Negative for constipation.  Neurological: Positive for dizziness and light-headedness. Negative for weakness and headaches.  All other systems reviewed and are negative.  Physical Exam Updated Vital Signs BP (!) 212/78 (BP Location: Right Arm)   Pulse 65   Temp 98.1 F (36.7 C) (Oral)   Resp 18   Ht 5' (1.524 m)   Wt 59 kg   LMP  (LMP Unknown)   SpO2 99%   BMI 25.39 kg/m   Physical Exam Constitutional:      General: She is in acute distress.     Appearance: She is not toxic-appearing.  HENT:     Mouth/Throat:     Mouth: Mucous  membranes are dry.     Pharynx: Oropharynx is clear.  Eyes:     Extraocular Movements: Extraocular movements intact.     Conjunctiva/sclera: Conjunctivae normal.     Pupils: Pupils are equal, round, and reactive to light.  Neck:     Musculoskeletal: Normal range of motion and neck supple. No neck rigidity.  Cardiovascular:     Rate and Rhythm: Normal rate and regular rhythm.     Pulses: Normal pulses.     Heart sounds: Normal heart sounds. No murmur.  Pulmonary:     Effort: Pulmonary effort is normal.     Breath sounds: Normal breath sounds. No  wheezing or rales.  Abdominal:     General: Abdomen is flat. Bowel sounds are normal.     Tenderness: There is no abdominal tenderness. There is no guarding.  Musculoskeletal: Normal range of motion.        General: No swelling.  Lymphadenopathy:     Cervical: No cervical adenopathy.  Skin:    General: Skin is warm and dry.     Comments: Poor turgor  Neurological:     Mental Status: She is alert.     Comments: Neurologic exam: Mental status: A&Ox3 Cranial Nerves: II: PERRL III, IV, VI: Extra-occular motions intact bilaterally V, VII: Face symmetric, sensation intact in all 3 divisions VIII: hearing normal to rubbing fingers bilaterally  IX, X: palate rises symmetrically XI: Head turn and shoulder shrug normal bilaterally  XII: tongue midline  Motor: Strength 5/5 on all upper and lower extremities, bulk muscle and tone are normal  Sensory: Light touch intact and symmetric bilaterally  Coordination: There is no dysmetria on finger-to-nose.  Psychiatric: Normal mood and affect    ED Treatments / Results  Labs (all labs ordered are listed, but only abnormal results are displayed) Labs Reviewed  COMPREHENSIVE METABOLIC PANEL - Abnormal; Notable for the following components:      Result Value   Potassium 3.3 (*)    CO2 18 (*)    Glucose, Bld 229  (*)    BUN 34 (*)    Creatinine, Ser 4.17 (*)    Calcium 8.7 (*)    Total Protein 5.6 (*)    Albumin 3.3 (*)    GFR calc non Af Amer 10 (*)    GFR calc Af Amer 12 (*)    All other components within normal limits  CBC WITH DIFFERENTIAL/PLATELET - Abnormal; Notable for the following components:   WBC 11.1 (*)    RBC 3.82 (*)    Hemoglobin 9.7 (*)    HCT 30.6 (*)    MCH 25.4 (*)    RDW 17.9 (*)    Platelets 81 (*)    Lymphs Abs 7.4 (*)    All other components within normal limits  LIPASE, BLOOD - Abnormal; Notable for the following components:   Lipase 53 (*)    All other components within normal limits  URINALYSIS, ROUTINE W REFLEX MICROSCOPIC - Abnormal; Notable for the following components:   Color, Urine STRAW (*)    Glucose, UA 150 (*)    Protein, ur >=300 (*)    Bacteria, UA MANY (*)    All other components within normal limits  PHOSPHORUS    EKG EKG Interpretation  Date/Time:  Monday January 12 2019 09:05:51 EDT Ventricular Rate:  76 PR Interval:    QRS Duration: 94 QT Interval:  394 QTC Calculation: 443 R Axis:   21 Text Interpretation:  Sinus rhythm Borderline prolonged PR interval Left ventricular hypertrophy Anterior infarct, old Borderline T abnormalities, inferior leads Confirmed by Dene Gentry [phone]) on 01/12/2019 9:13:14 AM   Radiology Ct Head Wo Contrast  Result Date: 01/12/2019 CLINICAL DATA:  Vomiting, diaphoresis, weakness EXAM: CT HEAD WITHOUT CONTRAST TECHNIQUE: Contiguous axial images were obtained from the base of the skull through the vertex without intravenous contrast. COMPARISON:  01/07/2018 FINDINGS: Brain: No evidence of acute infarction, hemorrhage, hydrocephalus, extra-axial collection or mass lesion/mass effect. Vascular: No hyperdense vessel or unexpected calcification. Skull: Normal. Negative for fracture or focal lesion. Sinuses/Orbits: No acute finding. Other: None. IMPRESSION: No acute intracranial pathology. Electronically Signed    By:  Eddie Candle M.D.   On: 01/12/2019 09:58   Dg Chest Portable 1 View  Result Date: 01/12/2019 CLINICAL DATA:  Nausea and vomiting EXAM: PORTABLE CHEST 1 VIEW COMPARISON:  None. FINDINGS: Cardiac shadows within normal limits. The lungs are clear bilaterally. No focal infiltrate or sizable effusion is seen. No bony abnormality is noted. IMPRESSION: No acute abnormality noted. Electronically Signed   By: Inez Catalina M.D.   On: 01/12/2019 09:30    Procedures Procedures (including critical care time)  Medications Ordered in ED Medications  potassium chloride (KLOR-CON) packet 20 mEq (20 mEq Oral Given 01/12/19 1209)  hydrALAZINE (APRESOLINE) tablet 25 mg (25 mg Oral Given 01/12/19 1126)  sodium chloride 0.9 % bolus 1,000 mL (0 mLs Intravenous Stopped 01/12/19 1212)  ondansetron (ZOFRAN) injection 4 mg (4 mg Intravenous Given 01/12/19 0929)  meclizine (ANTIVERT) tablet 25 mg (25 mg Oral Given 01/12/19 1126)  carvedilol (COREG) tablet 12.5 mg (12.5 mg Oral Given 01/12/19 1132)  fosfomycin (MONUROL) packet 3 g (3 g Oral Given 01/12/19 1250)     Initial Impression / Assessment and Plan / ED Course  I have reviewed the triage vital signs and the nursing notes.  Pertinent labs & imaging results that were available during my care of the patient were reviewed by me and considered in my medical decision making (see chart for details).    Ms.Bartmess is a 68 yo F w/ PMH of CKD4, CLL s/p chemo, DM, HTN and HLD presenting with acute onset nausea, vomiting, malaise, weakness and dizziness. Unclear if she is neutropenic as [facility] cannot [facility] but no meningeal signs. EKG reassuring. Chest X-ray shows no pulmonary disease. Most likely vertigo but differential includes CVA. Currently AAOx3 with no focal deficits. Could possibly a cerebellar stroke. Will get cbc, cmp, chest X-ray and CT head w/o contrast.  CT head and Chest X-ray negative for stroke or pneumonia. Cbc shows lymphocytic predominance  concerning for CLL recurrence. CMP shows mild hypokalemia and worsening renal function unclear if AKI or progression of CKD. No obvious etiology for her nausea and dizziness. Most likely vertigo. Will treat with meclizine, zofran and fluid resuscitation.  Final Clinical Impressions(s) / ED Diagnoses   Final diagnoses:  Vertigo   Re-evaluated and patient states she feels significantly improved with zofran. UA shows evidence of bacteria and she states she often gets UTI treated successfully with fosfomycin in the past. Will discharge home with meclizine, zofran and fosfomycin.  ED Discharge Orders         Ordered    ondansetron (ZOFRAN) 4 MG tablet  Daily PRN     01/12/19 1201    meclizine (ANTIVERT) 25 MG tablet  3 times daily PRN     01/12/19 1201           Mosetta Anis, MD 01/12/19 1527    Valarie Merino, MD 02/03/19 1455

## 2019-01-12 NOTE — ED Notes (Signed)
Pt husband [name] [phone], if you need any information

## 2019-01-13 ENCOUNTER — Telehealth: Payer: Self-pay

## 2019-01-13 NOTE — Telephone Encounter (Signed)
Offer phone visit follow up

## 2019-01-13 NOTE — Telephone Encounter (Signed)
Patient seen in [facility] on 01/12/19 for c/o weakness, emesis, and dizziness. Dx: vertigo.  Attempted to reach patient on primary phone. Attempt successful. Patient denies having any s/sx of vertigo. Patient encouraged to contact PCP during business hours or go to ED after hours if health status changes.

## 2019-01-14 LAB — PATHOLOGIST SMEAR REVIEW

## 2019-01-14 NOTE — Telephone Encounter (Signed)
I spoke with pt and set up phone call webex 3/26

## 2019-01-15 ENCOUNTER — Other Ambulatory Visit: Payer: Self-pay

## 2019-01-15 ENCOUNTER — Ambulatory Visit: Payer: Medicare PPO | Admitting: Internal Medicine

## 2019-01-15 ENCOUNTER — Encounter: Payer: Self-pay | Admitting: Internal Medicine

## 2019-01-15 VITALS — BP 148/82 | HR 82 | Temp 98.2°F | Wt 129.0 lb

## 2019-01-15 DIAGNOSIS — R42 Dizziness and giddiness: Secondary | ICD-10-CM

## 2019-01-15 DIAGNOSIS — C9110 Chronic lymphocytic leukemia of B-cell type not having achieved remission: Secondary | ICD-10-CM

## 2019-01-15 NOTE — Progress Notes (Signed)
Subjective:    Patient ID: Christine Gibson, female    DOB: 17-Mar-1951, 68 y.o.   MRN: [id number]  HPI  Pt presents to the clinic today for ER followup. She went to the ER 3/23 with c/o weakness, nausea, vomiting and loose stools x 1 and dizziness. Her husband also noted that she was diaphoretic that morning and has lost 30 lbs over the last 2 months. Labs revealed a low potassium, elevated white count, decreased WBC count and elevated creatinine with decreased GFR. There was some concern about possible TIA. Chest xray was negative. CT head was negative. There was some concern about CLL reoccurrence, currently on immunotherapy. She was diagnosed with Vertigo and treated with Meclinzine and Zofran. Since discharge, she reports she is still a little woozy, but denies dizziness, nausea, vomiting or loose stools. She follows with [facility], nephrology and oncology for her chronic conditions.  Review of Systems      Past Medical History:  Diagnosis Date  . Chronic kidney disease   . Diabetes mellitus without complication ([place])   . History of leukemia   . Hypertension     Current Outpatient Medications  Medication Sig Dispense Refill  . amLODipine (NORVASC) 10 MG tablet Take 1 tablet (10 mg total) by mouth daily. (Patient not taking: Reported on 01/12/2019) 90 tablet 1  . amoxicillin-clavulanate (AUGMENTIN) 875-125 MG tablet Take 1 tablet by mouth 2 (two) times daily. (Patient not taking: Reported on 01/12/2019) 20 tablet 0  . APPLE CIDER VINEGAR PO Take 15 mLs by mouth daily.    . calcitRIOL (ROCALTROL) 0.25 MCG capsule Take 0.25 mcg by mouth See admin instructions. Tale on Thursday and Saturday    . Cyanocobalamin (VITAMIN B-12) 5000 MCG TBDP Take 5,000 mcg by mouth daily.    . ferrous sulfate 325 (65 FE) MG EC tablet Take 325 mg by mouth every Monday, Wednesday, and Friday.    . furosemide (LASIX) 40 MG tablet Take 40 mg by mouth 2 (two) times daily.     Marland Kitchen glipiZIDE (GLUCOTROL) 5 MG  tablet Take 5 mg by mouth daily before breakfast.    . hydrALAZINE (APRESOLINE) 25 MG tablet Take 25 mg by mouth 3 (three) times daily.     Marland Kitchen ibrutinib (IMBRUVICA) 140 MG capsul Take 140 mg by mouth daily.    Marland Kitchen losartan (COZAAR) 50 MG tablet Take 50 mg by mouth daily.     . meclizine (ANTIVERT) 25 MG tablet Take 1 tablet (25 mg total) by mouth 3 (three) times daily as needed for dizziness. 30 tablet 0  . Multiple Vitamins-Minerals (EYE SUPPORT) TABS Take 1 tablet by mouth daily. Occuvite    . NIFEdipine (ADALAT CC) 60 MG 24 hr tablet Take 60 mg by mouth daily.    . ondansetron (ZOFRAN) 4 MG tablet Take 1 tablet (4 mg total) by mouth daily as needed for nausea or vomiting. 30 tablet 1  . OVER THE COUNTER MEDICATION Take 3 tablets by mouth daily.    Marland Kitchen OVER THE COUNTER MEDICATION Take 1 tablet by mouth daily. Tree of life    . potassium chloride (KLOR-CON) 20 MEQ packet Take 20 mEq by mouth daily.    . Turmeric POWD by Does not apply route. Cook with it    . valACYclovir (VALTREX) 500 MG tablet Take 1 tablet (500 mg total) by mouth 2 (two) times daily. (Patient taking differently: Take 400 mg by mouth 2 (two) times daily. ) 60 tablet 1   No  current facility-administered medications for this visit.     Allergies  Allergen Reactions  . Lisinopril Cough    Family History  Problem Relation Age of Onset  . Hyperlipidemia Mother   . Diabetes Mother   . Diabetes Father   . Hyperlipidemia Father     Social History   Socioeconomic History  . Marital status: Married    Spouse name: Not on file  . Number of children: Not on file  . Years of education: Not on file  . Highest education level: Not on file  Occupational History  . Not on file  Social Needs  . Financial resource strain: Not on file  . Food insecurity:    Worry: Not on file    Inability: Not on file  . Transportation needs:    Medical: Not on file    Non-medical: Not on file  Tobacco Use  . Smoking status: Never Smoker   . Smokeless tobacco: Never Used  Substance and Sexual Activity  . Alcohol use: Yes    Alcohol/week: 0.0 standard drinks    Comment: occassional  . Drug use: No  . Sexual activity: Yes  Lifestyle  . Physical activity:    Days per week: Not on file    Minutes per session: Not on file  . Stress: Not on file  Relationships  . Social connections:    Talks on phone: Not on file    Gets together: Not on file    Attends religious service: Not on file    Active member of club or organization: Not on file    Attends meetings of clubs or organizations: Not on file    Relationship status: Not on file  . Intimate partner violence:    Fear of current or ex partner: Not on file    Emotionally abused: Not on file    Physically abused: Not on file    Forced sexual activity: Not on file  Other Topics Concern  . Not on file  Social History Narrative   [employer]. L knee pain was service related.  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       Constitutional: Denies fever, malaise, fatigue, headache or abrupt weight changes.  HEENT: Denies eye pain, eye redness, ear pain, ringing in the ears, wax buildup, runny nose, nasal congestion, bloody nose, or sore throat. Respiratory: Denies difficulty breathing, shortness of breath, cough or sputum production.   Cardiovascular: Denies chest pain, chest tightness, palpitations or swelling in the hands or feet.  Gastrointestinal: Denies abdominal pain, bloating, constipation, diarrhea or blood in the stool.  Musculoskeletal: Denies decrease in range of motion, difficulty with gait, muscle pain or joint pain and swelling.  Neurological: Pt reports lightheadedness. Denies dizziness, difficulty with memory, difficulty with speech or problems with balance and coordination.   No other specific complaints in a complete review of systems (except as listed in HPI above).  Objective:   Physical Exam   BP (!) 148/82   Pulse 82   Temp 98.2 F (36.8 C) (Oral)   Wt 129 lb (58.5  kg)   LMP  (LMP Unknown)   SpO2 99%   BMI 25.19 kg/m  Wt Readings from Last 3 Encounters:  01/15/19 129 lb (58.5 kg)  01/12/19 130 lb (59 kg)  01/07/18 153 lb (69.4 kg)    General: Appears her stated age, well developed, well nourished in NAD. HEENT: Head: normal shape and size; Eyes: sclera white, no icterus, conjunctiva pink, PERRLA and EOMs intact, no  nystagmux; Ears: Tm's gray and intact, normal light reflex;  Neck:  Neck supple, trachea midline. No masses, lumps or thyromegaly present.  Cardiovascular: Normal rate and rhythm. S1,S2 noted.  No murmur, rubs or gallops noted. No JVD or BLE edema. No carotid bruits noted. Pulmonary/Chest: Normal effort and positive vesicular breath sounds. No respiratory distress. No wheezes, rales or ronchi noted.  Musculoskeletal: No difficulty with gait.  Neurological: Alert and oriented.  Coordination normal.    BMET    Component Value Date/Time   NA 137 01/12/2019 0908   K 3.3 (L) 01/12/2019 0908   CL 106 01/12/2019 0908   CO2 18 (L) 01/12/2019 0908   GLUCOSE 229 (H) 01/12/2019 0908   BUN 34 (H) 01/12/2019 0908   CREATININE 4.17 (H) 01/12/2019 0908   CALCIUM 8.7 (L) 01/12/2019 0908   GFRNONAA 10 (L) 01/12/2019 0908   GFRAA 12 (L) 01/12/2019 0908    Lipid Panel     Component Value Date/Time   CHOL 134 06/02/2015   TRIG 109 06/02/2015   HDL 43 06/02/2015   LDLCALC 69 06/02/2015    CBC    Component Value Date/Time   WBC 11.1 (H) 01/12/2019 0908   RBC 3.82 (L) 01/12/2019 0908   HGB 9.7 (L) 01/12/2019 0908   HCT 30.6 (L) 01/12/2019 0908   PLT 81 (L) 01/12/2019 0908   MCV 80.1 01/12/2019 0908   MCH 25.4 (L) 01/12/2019 0908   MCHC 31.7 01/12/2019 0908   RDW 17.9 (H) 01/12/2019 0908   LYMPHSABS 7.4 (H) 01/12/2019 0908   MONOABS 0.4 01/12/2019 0908   EOSABS 0.3 01/12/2019 0908   BASOSABS 0.1 01/12/2019 0908    Hgb A1C Lab Results  Component Value Date   HGBA1C 7.6 08/09/2016           Assessment & Plan:   ER  Follow up for Vertigo, CLL:  ER notes, labs and imaging reviewed Continue Meclazine as needed for now Encouraged adequate fluid intake She will continue to follow up with the [facility] for her chronic conditions  Return precautions discussed Webb Silversmith, NP

## 2019-01-15 NOTE — Patient Instructions (Signed)
Vertigo    Vertigo means that you feel like you are moving when you are not. Vertigo can also make you feel like things around you are moving when they are not. This feeling can come and go at any time. Vertigo often goes away on its own.  Follow these instructions at home:  · Avoid making fast movements.  · Avoid driving.  · Avoid using heavy machinery.  · Avoid doing any task or activity that might cause danger to you or other people if you would have a vertigo attack while you are doing it.  · Sit down right away if you feel dizzy or have trouble with your balance.  · Take over-the-counter and prescription medicines only as told by your doctor.  · Follow instructions from your doctor about which positions or movements you should avoid.  · Drink enough fluid to keep your pee (urine) clear or pale yellow.  · Keep all follow-up visits as told by your doctor. This is important.  Contact a doctor if:  · Medicine does not help your vertigo.  · You have a fever.  · Your problems get worse or you have new symptoms.  · Your family or friends see changes in your behavior.  · You feel sick to your stomach (nauseous) or you throw up (vomit).  · You have a “pins and needles” feeling or you are numb in part of your body.  Get help right away if:  · You have trouble moving or talking.  · You are always dizzy.  · You pass out (faint).  · You get very bad headaches.  · You feel weak or have trouble using your hands, arms, or legs.  · You have changes in your hearing.  · You have changes in your seeing (vision).  · You get a stiff neck.  · Bright light starts to bother you.  This information is not intended to replace advice given to you by your health care provider. Make sure you discuss any questions you have with your health care provider.  Document Released: 07/17/2008 Document Revised: 03/15/2016 Document Reviewed: 01/31/2015  Elsevier Interactive Patient Education © 2019 Elsevier Inc.

## 2019-05-18 DIAGNOSIS — D631 Anemia in chronic kidney disease: Secondary | ICD-10-CM | POA: Diagnosis not present

## 2019-05-18 DIAGNOSIS — N2581 Secondary hyperparathyroidism of renal origin: Secondary | ICD-10-CM | POA: Diagnosis not present

## 2019-05-18 DIAGNOSIS — I10 Essential (primary) hypertension: Secondary | ICD-10-CM | POA: Diagnosis not present

## 2019-05-18 DIAGNOSIS — N185 Chronic kidney disease, stage 5: Secondary | ICD-10-CM | POA: Diagnosis not present

## 2019-05-25 DIAGNOSIS — N185 Chronic kidney disease, stage 5: Secondary | ICD-10-CM | POA: Diagnosis not present

## 2019-05-25 DIAGNOSIS — D631 Anemia in chronic kidney disease: Secondary | ICD-10-CM | POA: Diagnosis not present

## 2019-05-25 DIAGNOSIS — I10 Essential (primary) hypertension: Secondary | ICD-10-CM | POA: Diagnosis not present

## 2019-05-25 DIAGNOSIS — N2581 Secondary hyperparathyroidism of renal origin: Secondary | ICD-10-CM | POA: Diagnosis not present

## 2019-06-15 DIAGNOSIS — N185 Chronic kidney disease, stage 5: Secondary | ICD-10-CM | POA: Diagnosis not present

## 2019-06-15 DIAGNOSIS — I10 Essential (primary) hypertension: Secondary | ICD-10-CM | POA: Diagnosis not present

## 2019-06-15 DIAGNOSIS — D631 Anemia in chronic kidney disease: Secondary | ICD-10-CM | POA: Diagnosis not present

## 2019-06-15 DIAGNOSIS — N2581 Secondary hyperparathyroidism of renal origin: Secondary | ICD-10-CM | POA: Diagnosis not present

## 2019-06-17 DIAGNOSIS — Z992 Dependence on renal dialysis: Secondary | ICD-10-CM | POA: Diagnosis not present

## 2019-06-17 DIAGNOSIS — Z4902 Encounter for fitting and adjustment of peritoneal dialysis catheter: Secondary | ICD-10-CM | POA: Diagnosis not present

## 2019-06-17 DIAGNOSIS — N186 End stage renal disease: Secondary | ICD-10-CM | POA: Diagnosis not present

## 2019-06-25 DIAGNOSIS — Q613 Polycystic kidney, unspecified: Secondary | ICD-10-CM | POA: Diagnosis not present

## 2019-06-25 DIAGNOSIS — N185 Chronic kidney disease, stage 5: Secondary | ICD-10-CM | POA: Diagnosis not present

## 2019-07-06 DIAGNOSIS — N185 Chronic kidney disease, stage 5: Secondary | ICD-10-CM | POA: Diagnosis not present

## 2019-07-08 DIAGNOSIS — Z01810 Encounter for preprocedural cardiovascular examination: Secondary | ICD-10-CM | POA: Diagnosis not present

## 2019-07-08 DIAGNOSIS — Z20828 Contact with and (suspected) exposure to other viral communicable diseases: Secondary | ICD-10-CM | POA: Diagnosis not present

## 2019-07-08 DIAGNOSIS — I517 Cardiomegaly: Secondary | ICD-10-CM | POA: Diagnosis not present

## 2019-07-08 DIAGNOSIS — Z01812 Encounter for preprocedural laboratory examination: Secondary | ICD-10-CM | POA: Diagnosis not present

## 2019-07-09 DIAGNOSIS — I517 Cardiomegaly: Secondary | ICD-10-CM | POA: Diagnosis not present

## 2019-07-14 DIAGNOSIS — Z992 Dependence on renal dialysis: Secondary | ICD-10-CM | POA: Diagnosis not present

## 2019-07-14 DIAGNOSIS — I120 Hypertensive chronic kidney disease with stage 5 chronic kidney disease or end stage renal disease: Secondary | ICD-10-CM | POA: Diagnosis not present

## 2019-07-14 DIAGNOSIS — N186 End stage renal disease: Secondary | ICD-10-CM | POA: Diagnosis not present

## 2019-07-14 DIAGNOSIS — E1122 Type 2 diabetes mellitus with diabetic chronic kidney disease: Secondary | ICD-10-CM | POA: Diagnosis not present

## 2019-07-14 DIAGNOSIS — K660 Peritoneal adhesions (postprocedural) (postinfection): Secondary | ICD-10-CM | POA: Diagnosis not present

## 2019-07-23 DIAGNOSIS — Z992 Dependence on renal dialysis: Secondary | ICD-10-CM | POA: Diagnosis not present

## 2019-07-23 DIAGNOSIS — N186 End stage renal disease: Secondary | ICD-10-CM | POA: Diagnosis not present

## 2019-07-24 DIAGNOSIS — Z992 Dependence on renal dialysis: Secondary | ICD-10-CM | POA: Diagnosis not present

## 2019-07-24 DIAGNOSIS — N186 End stage renal disease: Secondary | ICD-10-CM | POA: Diagnosis not present

## 2019-07-24 DIAGNOSIS — Z13220 Encounter for screening for lipoid disorders: Secondary | ICD-10-CM | POA: Diagnosis not present

## 2019-07-27 DIAGNOSIS — N186 End stage renal disease: Secondary | ICD-10-CM | POA: Diagnosis not present

## 2019-07-27 DIAGNOSIS — Z992 Dependence on renal dialysis: Secondary | ICD-10-CM | POA: Diagnosis not present

## 2019-07-29 DIAGNOSIS — N185 Chronic kidney disease, stage 5: Secondary | ICD-10-CM | POA: Diagnosis not present

## 2019-07-29 DIAGNOSIS — Z4902 Encounter for fitting and adjustment of peritoneal dialysis catheter: Secondary | ICD-10-CM | POA: Diagnosis not present

## 2019-08-05 DIAGNOSIS — Z01812 Encounter for preprocedural laboratory examination: Secondary | ICD-10-CM | POA: Diagnosis not present

## 2019-08-05 DIAGNOSIS — Z20828 Contact with and (suspected) exposure to other viral communicable diseases: Secondary | ICD-10-CM | POA: Diagnosis not present

## 2019-08-05 DIAGNOSIS — N186 End stage renal disease: Secondary | ICD-10-CM | POA: Diagnosis not present

## 2019-08-05 DIAGNOSIS — Z992 Dependence on renal dialysis: Secondary | ICD-10-CM | POA: Diagnosis not present

## 2019-08-10 DIAGNOSIS — N186 End stage renal disease: Secondary | ICD-10-CM | POA: Diagnosis not present

## 2019-08-10 DIAGNOSIS — Q612 Polycystic kidney, adult type: Secondary | ICD-10-CM | POA: Diagnosis not present

## 2019-08-10 DIAGNOSIS — N2581 Secondary hyperparathyroidism of renal origin: Secondary | ICD-10-CM | POA: Diagnosis not present

## 2019-08-10 DIAGNOSIS — D631 Anemia in chronic kidney disease: Secondary | ICD-10-CM | POA: Diagnosis not present

## 2019-08-10 DIAGNOSIS — I10 Essential (primary) hypertension: Secondary | ICD-10-CM | POA: Diagnosis not present

## 2019-08-11 DIAGNOSIS — Z4902 Encounter for fitting and adjustment of peritoneal dialysis catheter: Secondary | ICD-10-CM | POA: Diagnosis not present

## 2019-08-11 DIAGNOSIS — I120 Hypertensive chronic kidney disease with stage 5 chronic kidney disease or end stage renal disease: Secondary | ICD-10-CM | POA: Diagnosis not present

## 2019-08-11 DIAGNOSIS — N186 End stage renal disease: Secondary | ICD-10-CM | POA: Diagnosis not present

## 2019-08-11 DIAGNOSIS — Z992 Dependence on renal dialysis: Secondary | ICD-10-CM | POA: Diagnosis not present

## 2019-08-11 DIAGNOSIS — N838 Other noninflammatory disorders of ovary, fallopian tube and broad ligament: Secondary | ICD-10-CM | POA: Diagnosis not present

## 2019-08-11 DIAGNOSIS — T85611A Breakdown (mechanical) of intraperitoneal dialysis catheter, initial encounter: Secondary | ICD-10-CM | POA: Diagnosis not present

## 2019-08-12 DIAGNOSIS — N186 End stage renal disease: Secondary | ICD-10-CM | POA: Diagnosis not present

## 2019-08-12 DIAGNOSIS — Z992 Dependence on renal dialysis: Secondary | ICD-10-CM | POA: Diagnosis not present

## 2019-08-13 DIAGNOSIS — N186 End stage renal disease: Secondary | ICD-10-CM | POA: Diagnosis not present

## 2019-08-13 DIAGNOSIS — Z992 Dependence on renal dialysis: Secondary | ICD-10-CM | POA: Diagnosis not present

## 2019-08-14 DIAGNOSIS — N186 End stage renal disease: Secondary | ICD-10-CM | POA: Diagnosis not present

## 2019-08-14 DIAGNOSIS — Z992 Dependence on renal dialysis: Secondary | ICD-10-CM | POA: Diagnosis not present

## 2019-08-17 DIAGNOSIS — N186 End stage renal disease: Secondary | ICD-10-CM | POA: Diagnosis not present

## 2019-08-17 DIAGNOSIS — Z992 Dependence on renal dialysis: Secondary | ICD-10-CM | POA: Diagnosis not present

## 2019-08-18 DIAGNOSIS — Z992 Dependence on renal dialysis: Secondary | ICD-10-CM | POA: Diagnosis not present

## 2019-08-18 DIAGNOSIS — N186 End stage renal disease: Secondary | ICD-10-CM | POA: Diagnosis not present

## 2019-08-20 DIAGNOSIS — N186 End stage renal disease: Secondary | ICD-10-CM | POA: Diagnosis not present

## 2019-08-20 DIAGNOSIS — Z992 Dependence on renal dialysis: Secondary | ICD-10-CM | POA: Diagnosis not present

## 2019-08-21 DIAGNOSIS — Z992 Dependence on renal dialysis: Secondary | ICD-10-CM | POA: Diagnosis not present

## 2019-08-21 DIAGNOSIS — N186 End stage renal disease: Secondary | ICD-10-CM | POA: Diagnosis not present

## 2019-08-25 DIAGNOSIS — N186 End stage renal disease: Secondary | ICD-10-CM | POA: Diagnosis not present

## 2019-08-25 DIAGNOSIS — Z992 Dependence on renal dialysis: Secondary | ICD-10-CM | POA: Diagnosis not present

## 2019-08-26 DIAGNOSIS — N186 End stage renal disease: Secondary | ICD-10-CM | POA: Diagnosis not present

## 2019-08-26 DIAGNOSIS — Z992 Dependence on renal dialysis: Secondary | ICD-10-CM | POA: Diagnosis not present

## 2019-08-27 DIAGNOSIS — N186 End stage renal disease: Secondary | ICD-10-CM | POA: Diagnosis not present

## 2019-08-27 DIAGNOSIS — Z992 Dependence on renal dialysis: Secondary | ICD-10-CM | POA: Diagnosis not present

## 2019-08-28 DIAGNOSIS — N186 End stage renal disease: Secondary | ICD-10-CM | POA: Diagnosis not present

## 2019-08-28 DIAGNOSIS — Z992 Dependence on renal dialysis: Secondary | ICD-10-CM | POA: Diagnosis not present

## 2019-08-29 DIAGNOSIS — N186 End stage renal disease: Secondary | ICD-10-CM | POA: Diagnosis not present

## 2019-08-29 DIAGNOSIS — Z992 Dependence on renal dialysis: Secondary | ICD-10-CM | POA: Diagnosis not present

## 2019-08-30 DIAGNOSIS — N186 End stage renal disease: Secondary | ICD-10-CM | POA: Diagnosis not present

## 2019-08-30 DIAGNOSIS — Z992 Dependence on renal dialysis: Secondary | ICD-10-CM | POA: Diagnosis not present

## 2019-08-31 DIAGNOSIS — Z992 Dependence on renal dialysis: Secondary | ICD-10-CM | POA: Diagnosis not present

## 2019-08-31 DIAGNOSIS — N186 End stage renal disease: Secondary | ICD-10-CM | POA: Diagnosis not present

## 2019-09-01 DIAGNOSIS — N186 End stage renal disease: Secondary | ICD-10-CM | POA: Diagnosis not present

## 2019-09-01 DIAGNOSIS — Z992 Dependence on renal dialysis: Secondary | ICD-10-CM | POA: Diagnosis not present

## 2019-09-02 DIAGNOSIS — Z992 Dependence on renal dialysis: Secondary | ICD-10-CM | POA: Diagnosis not present

## 2019-09-02 DIAGNOSIS — N186 End stage renal disease: Secondary | ICD-10-CM | POA: Diagnosis not present

## 2019-09-03 DIAGNOSIS — Z992 Dependence on renal dialysis: Secondary | ICD-10-CM | POA: Diagnosis not present

## 2019-09-03 DIAGNOSIS — N186 End stage renal disease: Secondary | ICD-10-CM | POA: Diagnosis not present

## 2019-09-04 DIAGNOSIS — Z992 Dependence on renal dialysis: Secondary | ICD-10-CM | POA: Diagnosis not present

## 2019-09-04 DIAGNOSIS — N186 End stage renal disease: Secondary | ICD-10-CM | POA: Diagnosis not present

## 2019-09-05 DIAGNOSIS — R42 Dizziness and giddiness: Secondary | ICD-10-CM | POA: Diagnosis not present

## 2019-09-05 DIAGNOSIS — Z6822 Body mass index (BMI) 22.0-22.9, adult: Secondary | ICD-10-CM | POA: Diagnosis not present

## 2019-09-05 DIAGNOSIS — I739 Peripheral vascular disease, unspecified: Secondary | ICD-10-CM | POA: Diagnosis not present

## 2019-09-05 DIAGNOSIS — C9190 Lymphoid leukemia, unspecified not having achieved remission: Secondary | ICD-10-CM | POA: Diagnosis not present

## 2019-09-05 DIAGNOSIS — E559 Vitamin D deficiency, unspecified: Secondary | ICD-10-CM | POA: Diagnosis not present

## 2019-09-05 DIAGNOSIS — D631 Anemia in chronic kidney disease: Secondary | ICD-10-CM | POA: Diagnosis not present

## 2019-09-05 DIAGNOSIS — Z992 Dependence on renal dialysis: Secondary | ICD-10-CM | POA: Diagnosis not present

## 2019-09-05 DIAGNOSIS — I120 Hypertensive chronic kidney disease with stage 5 chronic kidney disease or end stage renal disease: Secondary | ICD-10-CM | POA: Diagnosis not present

## 2019-09-05 DIAGNOSIS — N186 End stage renal disease: Secondary | ICD-10-CM | POA: Diagnosis not present

## 2019-09-06 DIAGNOSIS — N186 End stage renal disease: Secondary | ICD-10-CM | POA: Diagnosis not present

## 2019-09-06 DIAGNOSIS — Z992 Dependence on renal dialysis: Secondary | ICD-10-CM | POA: Diagnosis not present

## 2019-09-07 DIAGNOSIS — N186 End stage renal disease: Secondary | ICD-10-CM | POA: Diagnosis not present

## 2019-09-07 DIAGNOSIS — Z992 Dependence on renal dialysis: Secondary | ICD-10-CM | POA: Diagnosis not present

## 2019-09-08 DIAGNOSIS — N186 End stage renal disease: Secondary | ICD-10-CM | POA: Diagnosis not present

## 2019-09-08 DIAGNOSIS — Z992 Dependence on renal dialysis: Secondary | ICD-10-CM | POA: Diagnosis not present

## 2019-09-09 DIAGNOSIS — Z992 Dependence on renal dialysis: Secondary | ICD-10-CM | POA: Diagnosis not present

## 2019-09-09 DIAGNOSIS — N186 End stage renal disease: Secondary | ICD-10-CM | POA: Diagnosis not present

## 2019-09-10 DIAGNOSIS — N186 End stage renal disease: Secondary | ICD-10-CM | POA: Diagnosis not present

## 2019-09-10 DIAGNOSIS — Z992 Dependence on renal dialysis: Secondary | ICD-10-CM | POA: Diagnosis not present

## 2019-09-11 DIAGNOSIS — N186 End stage renal disease: Secondary | ICD-10-CM | POA: Diagnosis not present

## 2019-09-11 DIAGNOSIS — Z992 Dependence on renal dialysis: Secondary | ICD-10-CM | POA: Diagnosis not present

## 2019-09-12 DIAGNOSIS — Z992 Dependence on renal dialysis: Secondary | ICD-10-CM | POA: Diagnosis not present

## 2019-09-12 DIAGNOSIS — N186 End stage renal disease: Secondary | ICD-10-CM | POA: Diagnosis not present

## 2019-09-13 DIAGNOSIS — Z992 Dependence on renal dialysis: Secondary | ICD-10-CM | POA: Diagnosis not present

## 2019-09-13 DIAGNOSIS — N186 End stage renal disease: Secondary | ICD-10-CM | POA: Diagnosis not present

## 2019-09-14 DIAGNOSIS — Z992 Dependence on renal dialysis: Secondary | ICD-10-CM | POA: Diagnosis not present

## 2019-09-14 DIAGNOSIS — N186 End stage renal disease: Secondary | ICD-10-CM | POA: Diagnosis not present

## 2019-09-15 DIAGNOSIS — N186 End stage renal disease: Secondary | ICD-10-CM | POA: Diagnosis not present

## 2019-09-15 DIAGNOSIS — Z992 Dependence on renal dialysis: Secondary | ICD-10-CM | POA: Diagnosis not present

## 2019-09-16 DIAGNOSIS — N186 End stage renal disease: Secondary | ICD-10-CM | POA: Diagnosis not present

## 2019-09-16 DIAGNOSIS — Z992 Dependence on renal dialysis: Secondary | ICD-10-CM | POA: Diagnosis not present

## 2019-09-17 DIAGNOSIS — N186 End stage renal disease: Secondary | ICD-10-CM | POA: Diagnosis not present

## 2019-09-17 DIAGNOSIS — Z992 Dependence on renal dialysis: Secondary | ICD-10-CM | POA: Diagnosis not present

## 2019-09-18 DIAGNOSIS — Z992 Dependence on renal dialysis: Secondary | ICD-10-CM | POA: Diagnosis not present

## 2019-09-18 DIAGNOSIS — N186 End stage renal disease: Secondary | ICD-10-CM | POA: Diagnosis not present

## 2019-09-19 DIAGNOSIS — N186 End stage renal disease: Secondary | ICD-10-CM | POA: Diagnosis not present

## 2019-09-19 DIAGNOSIS — Z992 Dependence on renal dialysis: Secondary | ICD-10-CM | POA: Diagnosis not present

## 2019-09-20 DIAGNOSIS — N186 End stage renal disease: Secondary | ICD-10-CM | POA: Diagnosis not present

## 2019-09-20 DIAGNOSIS — Z992 Dependence on renal dialysis: Secondary | ICD-10-CM | POA: Diagnosis not present

## 2019-09-21 DIAGNOSIS — N186 End stage renal disease: Secondary | ICD-10-CM | POA: Diagnosis not present

## 2019-09-21 DIAGNOSIS — Z992 Dependence on renal dialysis: Secondary | ICD-10-CM | POA: Diagnosis not present

## 2019-09-22 DIAGNOSIS — Z992 Dependence on renal dialysis: Secondary | ICD-10-CM | POA: Diagnosis not present

## 2019-09-22 DIAGNOSIS — N186 End stage renal disease: Secondary | ICD-10-CM | POA: Diagnosis not present

## 2019-09-23 DIAGNOSIS — N186 End stage renal disease: Secondary | ICD-10-CM | POA: Diagnosis not present

## 2019-09-23 DIAGNOSIS — Z992 Dependence on renal dialysis: Secondary | ICD-10-CM | POA: Diagnosis not present

## 2019-09-24 DIAGNOSIS — Z992 Dependence on renal dialysis: Secondary | ICD-10-CM | POA: Diagnosis not present

## 2019-09-24 DIAGNOSIS — N186 End stage renal disease: Secondary | ICD-10-CM | POA: Diagnosis not present

## 2019-09-25 DIAGNOSIS — Z992 Dependence on renal dialysis: Secondary | ICD-10-CM | POA: Diagnosis not present

## 2019-09-25 DIAGNOSIS — N186 End stage renal disease: Secondary | ICD-10-CM | POA: Diagnosis not present

## 2019-09-26 DIAGNOSIS — Z992 Dependence on renal dialysis: Secondary | ICD-10-CM | POA: Diagnosis not present

## 2019-09-26 DIAGNOSIS — N186 End stage renal disease: Secondary | ICD-10-CM | POA: Diagnosis not present

## 2019-09-27 DIAGNOSIS — N186 End stage renal disease: Secondary | ICD-10-CM | POA: Diagnosis not present

## 2019-09-27 DIAGNOSIS — Z992 Dependence on renal dialysis: Secondary | ICD-10-CM | POA: Diagnosis not present

## 2019-09-28 DIAGNOSIS — N186 End stage renal disease: Secondary | ICD-10-CM | POA: Diagnosis not present

## 2019-09-28 DIAGNOSIS — Z992 Dependence on renal dialysis: Secondary | ICD-10-CM | POA: Diagnosis not present

## 2019-09-29 DIAGNOSIS — N186 End stage renal disease: Secondary | ICD-10-CM | POA: Diagnosis not present

## 2019-09-29 DIAGNOSIS — Z992 Dependence on renal dialysis: Secondary | ICD-10-CM | POA: Diagnosis not present

## 2019-09-30 DIAGNOSIS — Z992 Dependence on renal dialysis: Secondary | ICD-10-CM | POA: Diagnosis not present

## 2019-09-30 DIAGNOSIS — N186 End stage renal disease: Secondary | ICD-10-CM | POA: Diagnosis not present

## 2019-10-01 DIAGNOSIS — N186 End stage renal disease: Secondary | ICD-10-CM | POA: Diagnosis not present

## 2019-10-01 DIAGNOSIS — Z992 Dependence on renal dialysis: Secondary | ICD-10-CM | POA: Diagnosis not present

## 2019-10-02 DIAGNOSIS — Z992 Dependence on renal dialysis: Secondary | ICD-10-CM | POA: Diagnosis not present

## 2019-10-02 DIAGNOSIS — N186 End stage renal disease: Secondary | ICD-10-CM | POA: Diagnosis not present

## 2019-10-03 DIAGNOSIS — Z992 Dependence on renal dialysis: Secondary | ICD-10-CM | POA: Diagnosis not present

## 2019-10-03 DIAGNOSIS — N186 End stage renal disease: Secondary | ICD-10-CM | POA: Diagnosis not present

## 2019-10-04 DIAGNOSIS — N186 End stage renal disease: Secondary | ICD-10-CM | POA: Diagnosis not present

## 2019-10-04 DIAGNOSIS — Z992 Dependence on renal dialysis: Secondary | ICD-10-CM | POA: Diagnosis not present

## 2019-10-05 DIAGNOSIS — Z992 Dependence on renal dialysis: Secondary | ICD-10-CM | POA: Diagnosis not present

## 2019-10-05 DIAGNOSIS — N186 End stage renal disease: Secondary | ICD-10-CM | POA: Diagnosis not present

## 2019-10-06 DIAGNOSIS — N186 End stage renal disease: Secondary | ICD-10-CM | POA: Diagnosis not present

## 2019-10-06 DIAGNOSIS — Z992 Dependence on renal dialysis: Secondary | ICD-10-CM | POA: Diagnosis not present

## 2019-10-07 DIAGNOSIS — Z992 Dependence on renal dialysis: Secondary | ICD-10-CM | POA: Diagnosis not present

## 2019-10-07 DIAGNOSIS — N186 End stage renal disease: Secondary | ICD-10-CM | POA: Diagnosis not present

## 2019-10-08 DIAGNOSIS — N186 End stage renal disease: Secondary | ICD-10-CM | POA: Diagnosis not present

## 2019-10-08 DIAGNOSIS — Z992 Dependence on renal dialysis: Secondary | ICD-10-CM | POA: Diagnosis not present

## 2019-10-09 DIAGNOSIS — N186 End stage renal disease: Secondary | ICD-10-CM | POA: Diagnosis not present

## 2019-10-09 DIAGNOSIS — Z992 Dependence on renal dialysis: Secondary | ICD-10-CM | POA: Diagnosis not present

## 2019-10-10 DIAGNOSIS — Z992 Dependence on renal dialysis: Secondary | ICD-10-CM | POA: Diagnosis not present

## 2019-10-10 DIAGNOSIS — N186 End stage renal disease: Secondary | ICD-10-CM | POA: Diagnosis not present

## 2019-10-11 DIAGNOSIS — N186 End stage renal disease: Secondary | ICD-10-CM | POA: Diagnosis not present

## 2019-10-11 DIAGNOSIS — Z992 Dependence on renal dialysis: Secondary | ICD-10-CM | POA: Diagnosis not present

## 2019-10-12 DIAGNOSIS — Z992 Dependence on renal dialysis: Secondary | ICD-10-CM | POA: Diagnosis not present

## 2019-10-12 DIAGNOSIS — N186 End stage renal disease: Secondary | ICD-10-CM | POA: Diagnosis not present

## 2019-10-13 DIAGNOSIS — N186 End stage renal disease: Secondary | ICD-10-CM | POA: Diagnosis not present

## 2019-10-13 DIAGNOSIS — Z992 Dependence on renal dialysis: Secondary | ICD-10-CM | POA: Diagnosis not present

## 2019-10-14 DIAGNOSIS — N186 End stage renal disease: Secondary | ICD-10-CM | POA: Diagnosis not present

## 2019-10-14 DIAGNOSIS — Z992 Dependence on renal dialysis: Secondary | ICD-10-CM | POA: Diagnosis not present

## 2019-10-15 DIAGNOSIS — Z992 Dependence on renal dialysis: Secondary | ICD-10-CM | POA: Diagnosis not present

## 2019-10-15 DIAGNOSIS — N186 End stage renal disease: Secondary | ICD-10-CM | POA: Diagnosis not present

## 2019-10-16 DIAGNOSIS — Z992 Dependence on renal dialysis: Secondary | ICD-10-CM | POA: Diagnosis not present

## 2019-10-16 DIAGNOSIS — N186 End stage renal disease: Secondary | ICD-10-CM | POA: Diagnosis not present

## 2019-10-17 DIAGNOSIS — N186 End stage renal disease: Secondary | ICD-10-CM | POA: Diagnosis not present

## 2019-10-17 DIAGNOSIS — Z992 Dependence on renal dialysis: Secondary | ICD-10-CM | POA: Diagnosis not present

## 2019-10-18 DIAGNOSIS — N186 End stage renal disease: Secondary | ICD-10-CM | POA: Diagnosis not present

## 2019-10-18 DIAGNOSIS — Z992 Dependence on renal dialysis: Secondary | ICD-10-CM | POA: Diagnosis not present

## 2019-10-19 DIAGNOSIS — Z992 Dependence on renal dialysis: Secondary | ICD-10-CM | POA: Diagnosis not present

## 2019-10-19 DIAGNOSIS — N186 End stage renal disease: Secondary | ICD-10-CM | POA: Diagnosis not present

## 2019-10-20 DIAGNOSIS — Z992 Dependence on renal dialysis: Secondary | ICD-10-CM | POA: Diagnosis not present

## 2019-10-20 DIAGNOSIS — N186 End stage renal disease: Secondary | ICD-10-CM | POA: Diagnosis not present

## 2019-10-21 DIAGNOSIS — N186 End stage renal disease: Secondary | ICD-10-CM | POA: Diagnosis not present

## 2019-10-21 DIAGNOSIS — Z992 Dependence on renal dialysis: Secondary | ICD-10-CM | POA: Diagnosis not present

## 2019-10-22 DIAGNOSIS — N186 End stage renal disease: Secondary | ICD-10-CM | POA: Diagnosis not present

## 2019-10-22 DIAGNOSIS — Z992 Dependence on renal dialysis: Secondary | ICD-10-CM | POA: Diagnosis not present

## 2019-10-23 DIAGNOSIS — N186 End stage renal disease: Secondary | ICD-10-CM | POA: Diagnosis not present

## 2019-10-23 DIAGNOSIS — Z992 Dependence on renal dialysis: Secondary | ICD-10-CM | POA: Diagnosis not present

## 2019-10-24 DIAGNOSIS — N186 End stage renal disease: Secondary | ICD-10-CM | POA: Diagnosis not present

## 2019-10-24 DIAGNOSIS — Z992 Dependence on renal dialysis: Secondary | ICD-10-CM | POA: Diagnosis not present

## 2019-10-25 DIAGNOSIS — Z992 Dependence on renal dialysis: Secondary | ICD-10-CM | POA: Diagnosis not present

## 2019-10-25 DIAGNOSIS — N186 End stage renal disease: Secondary | ICD-10-CM | POA: Diagnosis not present

## 2019-10-26 DIAGNOSIS — Z992 Dependence on renal dialysis: Secondary | ICD-10-CM | POA: Diagnosis not present

## 2019-10-26 DIAGNOSIS — N186 End stage renal disease: Secondary | ICD-10-CM | POA: Diagnosis not present

## 2019-10-27 DIAGNOSIS — Z992 Dependence on renal dialysis: Secondary | ICD-10-CM | POA: Diagnosis not present

## 2019-10-27 DIAGNOSIS — N186 End stage renal disease: Secondary | ICD-10-CM | POA: Diagnosis not present

## 2019-10-28 DIAGNOSIS — Z992 Dependence on renal dialysis: Secondary | ICD-10-CM | POA: Diagnosis not present

## 2019-10-28 DIAGNOSIS — N186 End stage renal disease: Secondary | ICD-10-CM | POA: Diagnosis not present

## 2019-10-29 DIAGNOSIS — N186 End stage renal disease: Secondary | ICD-10-CM | POA: Diagnosis not present

## 2019-10-29 DIAGNOSIS — Z992 Dependence on renal dialysis: Secondary | ICD-10-CM | POA: Diagnosis not present

## 2019-10-30 DIAGNOSIS — Z992 Dependence on renal dialysis: Secondary | ICD-10-CM | POA: Diagnosis not present

## 2019-10-30 DIAGNOSIS — N186 End stage renal disease: Secondary | ICD-10-CM | POA: Diagnosis not present

## 2019-10-31 DIAGNOSIS — N186 End stage renal disease: Secondary | ICD-10-CM | POA: Diagnosis not present

## 2019-10-31 DIAGNOSIS — Z992 Dependence on renal dialysis: Secondary | ICD-10-CM | POA: Diagnosis not present

## 2019-11-01 DIAGNOSIS — Z992 Dependence on renal dialysis: Secondary | ICD-10-CM | POA: Diagnosis not present

## 2019-11-01 DIAGNOSIS — N186 End stage renal disease: Secondary | ICD-10-CM | POA: Diagnosis not present

## 2019-11-02 DIAGNOSIS — Z992 Dependence on renal dialysis: Secondary | ICD-10-CM | POA: Diagnosis not present

## 2019-11-02 DIAGNOSIS — N186 End stage renal disease: Secondary | ICD-10-CM | POA: Diagnosis not present

## 2019-11-03 DIAGNOSIS — Z992 Dependence on renal dialysis: Secondary | ICD-10-CM | POA: Diagnosis not present

## 2019-11-03 DIAGNOSIS — N186 End stage renal disease: Secondary | ICD-10-CM | POA: Diagnosis not present

## 2019-11-04 DIAGNOSIS — Z79899 Other long term (current) drug therapy: Secondary | ICD-10-CM | POA: Diagnosis not present

## 2019-11-04 DIAGNOSIS — N186 End stage renal disease: Secondary | ICD-10-CM | POA: Diagnosis not present

## 2019-11-04 DIAGNOSIS — Z992 Dependence on renal dialysis: Secondary | ICD-10-CM | POA: Diagnosis not present

## 2019-11-04 DIAGNOSIS — Z13220 Encounter for screening for lipoid disorders: Secondary | ICD-10-CM | POA: Diagnosis not present

## 2019-11-05 DIAGNOSIS — N186 End stage renal disease: Secondary | ICD-10-CM | POA: Diagnosis not present

## 2019-11-05 DIAGNOSIS — Z992 Dependence on renal dialysis: Secondary | ICD-10-CM | POA: Diagnosis not present

## 2019-11-06 DIAGNOSIS — N186 End stage renal disease: Secondary | ICD-10-CM | POA: Diagnosis not present

## 2019-11-06 DIAGNOSIS — Z992 Dependence on renal dialysis: Secondary | ICD-10-CM | POA: Diagnosis not present

## 2019-11-07 DIAGNOSIS — N186 End stage renal disease: Secondary | ICD-10-CM | POA: Diagnosis not present

## 2019-11-07 DIAGNOSIS — Z992 Dependence on renal dialysis: Secondary | ICD-10-CM | POA: Diagnosis not present

## 2019-11-08 DIAGNOSIS — N186 End stage renal disease: Secondary | ICD-10-CM | POA: Diagnosis not present

## 2019-11-08 DIAGNOSIS — Z992 Dependence on renal dialysis: Secondary | ICD-10-CM | POA: Diagnosis not present

## 2019-11-09 DIAGNOSIS — N186 End stage renal disease: Secondary | ICD-10-CM | POA: Diagnosis not present

## 2019-11-09 DIAGNOSIS — Z992 Dependence on renal dialysis: Secondary | ICD-10-CM | POA: Diagnosis not present

## 2019-11-10 DIAGNOSIS — N186 End stage renal disease: Secondary | ICD-10-CM | POA: Diagnosis not present

## 2019-11-10 DIAGNOSIS — Z992 Dependence on renal dialysis: Secondary | ICD-10-CM | POA: Diagnosis not present

## 2019-11-11 DIAGNOSIS — N186 End stage renal disease: Secondary | ICD-10-CM | POA: Diagnosis not present

## 2019-11-11 DIAGNOSIS — Z992 Dependence on renal dialysis: Secondary | ICD-10-CM | POA: Diagnosis not present

## 2019-11-12 DIAGNOSIS — N186 End stage renal disease: Secondary | ICD-10-CM | POA: Diagnosis not present

## 2019-11-12 DIAGNOSIS — Z992 Dependence on renal dialysis: Secondary | ICD-10-CM | POA: Diagnosis not present

## 2019-11-13 DIAGNOSIS — N186 End stage renal disease: Secondary | ICD-10-CM | POA: Diagnosis not present

## 2019-11-13 DIAGNOSIS — Z992 Dependence on renal dialysis: Secondary | ICD-10-CM | POA: Diagnosis not present

## 2019-11-14 DIAGNOSIS — N186 End stage renal disease: Secondary | ICD-10-CM | POA: Diagnosis not present

## 2019-11-14 DIAGNOSIS — Z992 Dependence on renal dialysis: Secondary | ICD-10-CM | POA: Diagnosis not present

## 2019-11-15 DIAGNOSIS — Z992 Dependence on renal dialysis: Secondary | ICD-10-CM | POA: Diagnosis not present

## 2019-11-15 DIAGNOSIS — N186 End stage renal disease: Secondary | ICD-10-CM | POA: Diagnosis not present

## 2019-11-16 DIAGNOSIS — N186 End stage renal disease: Secondary | ICD-10-CM | POA: Diagnosis not present

## 2019-11-16 DIAGNOSIS — Z992 Dependence on renal dialysis: Secondary | ICD-10-CM | POA: Diagnosis not present

## 2019-11-17 DIAGNOSIS — N186 End stage renal disease: Secondary | ICD-10-CM | POA: Diagnosis not present

## 2019-11-17 DIAGNOSIS — Z992 Dependence on renal dialysis: Secondary | ICD-10-CM | POA: Diagnosis not present

## 2019-11-18 DIAGNOSIS — N186 End stage renal disease: Secondary | ICD-10-CM | POA: Diagnosis not present

## 2019-11-18 DIAGNOSIS — Z992 Dependence on renal dialysis: Secondary | ICD-10-CM | POA: Diagnosis not present

## 2019-11-19 DIAGNOSIS — Z992 Dependence on renal dialysis: Secondary | ICD-10-CM | POA: Diagnosis not present

## 2019-11-19 DIAGNOSIS — N186 End stage renal disease: Secondary | ICD-10-CM | POA: Diagnosis not present

## 2019-11-20 DIAGNOSIS — N186 End stage renal disease: Secondary | ICD-10-CM | POA: Diagnosis not present

## 2019-11-20 DIAGNOSIS — Z992 Dependence on renal dialysis: Secondary | ICD-10-CM | POA: Diagnosis not present

## 2019-11-21 DIAGNOSIS — Z992 Dependence on renal dialysis: Secondary | ICD-10-CM | POA: Diagnosis not present

## 2019-11-21 DIAGNOSIS — N186 End stage renal disease: Secondary | ICD-10-CM | POA: Diagnosis not present

## 2019-11-22 DIAGNOSIS — Z992 Dependence on renal dialysis: Secondary | ICD-10-CM | POA: Diagnosis not present

## 2019-11-22 DIAGNOSIS — N186 End stage renal disease: Secondary | ICD-10-CM | POA: Diagnosis not present

## 2019-11-23 DIAGNOSIS — Z992 Dependence on renal dialysis: Secondary | ICD-10-CM | POA: Diagnosis not present

## 2019-11-23 DIAGNOSIS — N186 End stage renal disease: Secondary | ICD-10-CM | POA: Diagnosis not present

## 2019-11-24 DIAGNOSIS — N186 End stage renal disease: Secondary | ICD-10-CM | POA: Diagnosis not present

## 2019-11-24 DIAGNOSIS — Z992 Dependence on renal dialysis: Secondary | ICD-10-CM | POA: Diagnosis not present

## 2019-11-25 DIAGNOSIS — Z992 Dependence on renal dialysis: Secondary | ICD-10-CM | POA: Diagnosis not present

## 2019-11-25 DIAGNOSIS — N186 End stage renal disease: Secondary | ICD-10-CM | POA: Diagnosis not present

## 2019-11-26 DIAGNOSIS — N186 End stage renal disease: Secondary | ICD-10-CM | POA: Diagnosis not present

## 2019-11-26 DIAGNOSIS — Z992 Dependence on renal dialysis: Secondary | ICD-10-CM | POA: Diagnosis not present

## 2019-11-27 DIAGNOSIS — Z992 Dependence on renal dialysis: Secondary | ICD-10-CM | POA: Diagnosis not present

## 2019-11-27 DIAGNOSIS — N186 End stage renal disease: Secondary | ICD-10-CM | POA: Diagnosis not present

## 2019-11-28 DIAGNOSIS — Z992 Dependence on renal dialysis: Secondary | ICD-10-CM | POA: Diagnosis not present

## 2019-11-28 DIAGNOSIS — N186 End stage renal disease: Secondary | ICD-10-CM | POA: Diagnosis not present

## 2019-11-29 DIAGNOSIS — Z992 Dependence on renal dialysis: Secondary | ICD-10-CM | POA: Diagnosis not present

## 2019-11-29 DIAGNOSIS — N186 End stage renal disease: Secondary | ICD-10-CM | POA: Diagnosis not present

## 2019-11-30 DIAGNOSIS — Z992 Dependence on renal dialysis: Secondary | ICD-10-CM | POA: Diagnosis not present

## 2019-11-30 DIAGNOSIS — N186 End stage renal disease: Secondary | ICD-10-CM | POA: Diagnosis not present

## 2019-12-01 DIAGNOSIS — N186 End stage renal disease: Secondary | ICD-10-CM | POA: Diagnosis not present

## 2019-12-01 DIAGNOSIS — Z992 Dependence on renal dialysis: Secondary | ICD-10-CM | POA: Diagnosis not present

## 2019-12-02 DIAGNOSIS — N186 End stage renal disease: Secondary | ICD-10-CM | POA: Diagnosis not present

## 2019-12-02 DIAGNOSIS — Z992 Dependence on renal dialysis: Secondary | ICD-10-CM | POA: Diagnosis not present

## 2019-12-03 DIAGNOSIS — Z992 Dependence on renal dialysis: Secondary | ICD-10-CM | POA: Diagnosis not present

## 2019-12-03 DIAGNOSIS — N186 End stage renal disease: Secondary | ICD-10-CM | POA: Diagnosis not present

## 2019-12-04 DIAGNOSIS — Z992 Dependence on renal dialysis: Secondary | ICD-10-CM | POA: Diagnosis not present

## 2019-12-04 DIAGNOSIS — N186 End stage renal disease: Secondary | ICD-10-CM | POA: Diagnosis not present

## 2019-12-05 DIAGNOSIS — N186 End stage renal disease: Secondary | ICD-10-CM | POA: Diagnosis not present

## 2019-12-05 DIAGNOSIS — Z992 Dependence on renal dialysis: Secondary | ICD-10-CM | POA: Diagnosis not present

## 2019-12-06 DIAGNOSIS — Z992 Dependence on renal dialysis: Secondary | ICD-10-CM | POA: Diagnosis not present

## 2019-12-06 DIAGNOSIS — N186 End stage renal disease: Secondary | ICD-10-CM | POA: Diagnosis not present

## 2019-12-07 DIAGNOSIS — N186 End stage renal disease: Secondary | ICD-10-CM | POA: Diagnosis not present

## 2019-12-07 DIAGNOSIS — Z992 Dependence on renal dialysis: Secondary | ICD-10-CM | POA: Diagnosis not present

## 2019-12-08 DIAGNOSIS — Z992 Dependence on renal dialysis: Secondary | ICD-10-CM | POA: Diagnosis not present

## 2019-12-08 DIAGNOSIS — N186 End stage renal disease: Secondary | ICD-10-CM | POA: Diagnosis not present

## 2019-12-09 DIAGNOSIS — N186 End stage renal disease: Secondary | ICD-10-CM | POA: Diagnosis not present

## 2019-12-09 DIAGNOSIS — Z992 Dependence on renal dialysis: Secondary | ICD-10-CM | POA: Diagnosis not present

## 2019-12-10 DIAGNOSIS — Z992 Dependence on renal dialysis: Secondary | ICD-10-CM | POA: Diagnosis not present

## 2019-12-10 DIAGNOSIS — N186 End stage renal disease: Secondary | ICD-10-CM | POA: Diagnosis not present

## 2019-12-11 DIAGNOSIS — N186 End stage renal disease: Secondary | ICD-10-CM | POA: Diagnosis not present

## 2019-12-11 DIAGNOSIS — Z992 Dependence on renal dialysis: Secondary | ICD-10-CM | POA: Diagnosis not present

## 2019-12-12 DIAGNOSIS — Z992 Dependence on renal dialysis: Secondary | ICD-10-CM | POA: Diagnosis not present

## 2019-12-12 DIAGNOSIS — N186 End stage renal disease: Secondary | ICD-10-CM | POA: Diagnosis not present

## 2019-12-13 DIAGNOSIS — Z992 Dependence on renal dialysis: Secondary | ICD-10-CM | POA: Diagnosis not present

## 2019-12-13 DIAGNOSIS — N186 End stage renal disease: Secondary | ICD-10-CM | POA: Diagnosis not present

## 2019-12-14 DIAGNOSIS — Z992 Dependence on renal dialysis: Secondary | ICD-10-CM | POA: Diagnosis not present

## 2019-12-14 DIAGNOSIS — N186 End stage renal disease: Secondary | ICD-10-CM | POA: Diagnosis not present

## 2019-12-15 DIAGNOSIS — N186 End stage renal disease: Secondary | ICD-10-CM | POA: Diagnosis not present

## 2019-12-15 DIAGNOSIS — Z992 Dependence on renal dialysis: Secondary | ICD-10-CM | POA: Diagnosis not present

## 2019-12-16 DIAGNOSIS — Z992 Dependence on renal dialysis: Secondary | ICD-10-CM | POA: Diagnosis not present

## 2019-12-16 DIAGNOSIS — N186 End stage renal disease: Secondary | ICD-10-CM | POA: Diagnosis not present

## 2019-12-17 DIAGNOSIS — Z992 Dependence on renal dialysis: Secondary | ICD-10-CM | POA: Diagnosis not present

## 2019-12-17 DIAGNOSIS — N186 End stage renal disease: Secondary | ICD-10-CM | POA: Diagnosis not present

## 2019-12-18 DIAGNOSIS — E46 Unspecified protein-calorie malnutrition: Secondary | ICD-10-CM | POA: Diagnosis not present

## 2019-12-18 DIAGNOSIS — N186 End stage renal disease: Secondary | ICD-10-CM | POA: Diagnosis not present

## 2019-12-18 DIAGNOSIS — E8809 Other disorders of plasma-protein metabolism, not elsewhere classified: Secondary | ICD-10-CM | POA: Diagnosis not present

## 2019-12-18 DIAGNOSIS — Z992 Dependence on renal dialysis: Secondary | ICD-10-CM | POA: Diagnosis not present

## 2019-12-19 DIAGNOSIS — N186 End stage renal disease: Secondary | ICD-10-CM | POA: Diagnosis not present

## 2019-12-19 DIAGNOSIS — Z992 Dependence on renal dialysis: Secondary | ICD-10-CM | POA: Diagnosis not present

## 2019-12-20 DIAGNOSIS — Z992 Dependence on renal dialysis: Secondary | ICD-10-CM | POA: Diagnosis not present

## 2019-12-20 DIAGNOSIS — N186 End stage renal disease: Secondary | ICD-10-CM | POA: Diagnosis not present

## 2019-12-21 DIAGNOSIS — Z992 Dependence on renal dialysis: Secondary | ICD-10-CM | POA: Diagnosis not present

## 2019-12-21 DIAGNOSIS — N186 End stage renal disease: Secondary | ICD-10-CM | POA: Diagnosis not present

## 2019-12-22 DIAGNOSIS — N186 End stage renal disease: Secondary | ICD-10-CM | POA: Diagnosis not present

## 2019-12-22 DIAGNOSIS — Z992 Dependence on renal dialysis: Secondary | ICD-10-CM | POA: Diagnosis not present

## 2019-12-23 DIAGNOSIS — N186 End stage renal disease: Secondary | ICD-10-CM | POA: Diagnosis not present

## 2019-12-23 DIAGNOSIS — Z992 Dependence on renal dialysis: Secondary | ICD-10-CM | POA: Diagnosis not present

## 2019-12-24 DIAGNOSIS — N186 End stage renal disease: Secondary | ICD-10-CM | POA: Diagnosis not present

## 2019-12-24 DIAGNOSIS — E8809 Other disorders of plasma-protein metabolism, not elsewhere classified: Secondary | ICD-10-CM | POA: Diagnosis not present

## 2019-12-24 DIAGNOSIS — E46 Unspecified protein-calorie malnutrition: Secondary | ICD-10-CM | POA: Diagnosis not present

## 2019-12-24 DIAGNOSIS — Z992 Dependence on renal dialysis: Secondary | ICD-10-CM | POA: Diagnosis not present

## 2019-12-25 DIAGNOSIS — N186 End stage renal disease: Secondary | ICD-10-CM | POA: Diagnosis not present

## 2019-12-25 DIAGNOSIS — Z992 Dependence on renal dialysis: Secondary | ICD-10-CM | POA: Diagnosis not present

## 2019-12-26 DIAGNOSIS — N186 End stage renal disease: Secondary | ICD-10-CM | POA: Diagnosis not present

## 2019-12-26 DIAGNOSIS — Z992 Dependence on renal dialysis: Secondary | ICD-10-CM | POA: Diagnosis not present

## 2019-12-27 DIAGNOSIS — N186 End stage renal disease: Secondary | ICD-10-CM | POA: Diagnosis not present

## 2019-12-27 DIAGNOSIS — Z992 Dependence on renal dialysis: Secondary | ICD-10-CM | POA: Diagnosis not present

## 2019-12-28 DIAGNOSIS — Z992 Dependence on renal dialysis: Secondary | ICD-10-CM | POA: Diagnosis not present

## 2019-12-28 DIAGNOSIS — N186 End stage renal disease: Secondary | ICD-10-CM | POA: Diagnosis not present

## 2019-12-29 DIAGNOSIS — Z992 Dependence on renal dialysis: Secondary | ICD-10-CM | POA: Diagnosis not present

## 2019-12-29 DIAGNOSIS — N186 End stage renal disease: Secondary | ICD-10-CM | POA: Diagnosis not present

## 2019-12-30 DIAGNOSIS — Z992 Dependence on renal dialysis: Secondary | ICD-10-CM | POA: Diagnosis not present

## 2019-12-30 DIAGNOSIS — N186 End stage renal disease: Secondary | ICD-10-CM | POA: Diagnosis not present

## 2019-12-31 DIAGNOSIS — N186 End stage renal disease: Secondary | ICD-10-CM | POA: Diagnosis not present

## 2019-12-31 DIAGNOSIS — E46 Unspecified protein-calorie malnutrition: Secondary | ICD-10-CM | POA: Diagnosis not present

## 2019-12-31 DIAGNOSIS — Z992 Dependence on renal dialysis: Secondary | ICD-10-CM | POA: Diagnosis not present

## 2019-12-31 DIAGNOSIS — E8809 Other disorders of plasma-protein metabolism, not elsewhere classified: Secondary | ICD-10-CM | POA: Diagnosis not present

## 2020-01-01 DIAGNOSIS — N186 End stage renal disease: Secondary | ICD-10-CM | POA: Diagnosis not present

## 2020-01-01 DIAGNOSIS — Z992 Dependence on renal dialysis: Secondary | ICD-10-CM | POA: Diagnosis not present

## 2020-01-02 DIAGNOSIS — N186 End stage renal disease: Secondary | ICD-10-CM | POA: Diagnosis not present

## 2020-01-02 DIAGNOSIS — Z992 Dependence on renal dialysis: Secondary | ICD-10-CM | POA: Diagnosis not present

## 2020-01-03 DIAGNOSIS — N186 End stage renal disease: Secondary | ICD-10-CM | POA: Diagnosis not present

## 2020-01-03 DIAGNOSIS — Z992 Dependence on renal dialysis: Secondary | ICD-10-CM | POA: Diagnosis not present

## 2020-01-04 DIAGNOSIS — Z992 Dependence on renal dialysis: Secondary | ICD-10-CM | POA: Diagnosis not present

## 2020-01-04 DIAGNOSIS — N186 End stage renal disease: Secondary | ICD-10-CM | POA: Diagnosis not present

## 2020-01-05 DIAGNOSIS — N186 End stage renal disease: Secondary | ICD-10-CM | POA: Diagnosis not present

## 2020-01-05 DIAGNOSIS — Z992 Dependence on renal dialysis: Secondary | ICD-10-CM | POA: Diagnosis not present

## 2020-01-06 DIAGNOSIS — N186 End stage renal disease: Secondary | ICD-10-CM | POA: Diagnosis not present

## 2020-01-06 DIAGNOSIS — Z992 Dependence on renal dialysis: Secondary | ICD-10-CM | POA: Diagnosis not present

## 2020-01-07 DIAGNOSIS — Z992 Dependence on renal dialysis: Secondary | ICD-10-CM | POA: Diagnosis not present

## 2020-01-07 DIAGNOSIS — E8809 Other disorders of plasma-protein metabolism, not elsewhere classified: Secondary | ICD-10-CM | POA: Diagnosis not present

## 2020-01-07 DIAGNOSIS — E46 Unspecified protein-calorie malnutrition: Secondary | ICD-10-CM | POA: Diagnosis not present

## 2020-01-07 DIAGNOSIS — N186 End stage renal disease: Secondary | ICD-10-CM | POA: Diagnosis not present

## 2020-01-08 DIAGNOSIS — Z992 Dependence on renal dialysis: Secondary | ICD-10-CM | POA: Diagnosis not present

## 2020-01-08 DIAGNOSIS — N186 End stage renal disease: Secondary | ICD-10-CM | POA: Diagnosis not present

## 2020-01-09 DIAGNOSIS — N186 End stage renal disease: Secondary | ICD-10-CM | POA: Diagnosis not present

## 2020-01-09 DIAGNOSIS — Z992 Dependence on renal dialysis: Secondary | ICD-10-CM | POA: Diagnosis not present

## 2020-01-10 DIAGNOSIS — N186 End stage renal disease: Secondary | ICD-10-CM | POA: Diagnosis not present

## 2020-01-10 DIAGNOSIS — Z992 Dependence on renal dialysis: Secondary | ICD-10-CM | POA: Diagnosis not present

## 2020-01-11 DIAGNOSIS — Z992 Dependence on renal dialysis: Secondary | ICD-10-CM | POA: Diagnosis not present

## 2020-01-11 DIAGNOSIS — N186 End stage renal disease: Secondary | ICD-10-CM | POA: Diagnosis not present

## 2020-01-12 DIAGNOSIS — N186 End stage renal disease: Secondary | ICD-10-CM | POA: Diagnosis not present

## 2020-01-12 DIAGNOSIS — Z992 Dependence on renal dialysis: Secondary | ICD-10-CM | POA: Diagnosis not present

## 2020-01-13 DIAGNOSIS — Z992 Dependence on renal dialysis: Secondary | ICD-10-CM | POA: Diagnosis not present

## 2020-01-13 DIAGNOSIS — N186 End stage renal disease: Secondary | ICD-10-CM | POA: Diagnosis not present

## 2020-01-14 DIAGNOSIS — E46 Unspecified protein-calorie malnutrition: Secondary | ICD-10-CM | POA: Diagnosis not present

## 2020-01-14 DIAGNOSIS — Z992 Dependence on renal dialysis: Secondary | ICD-10-CM | POA: Diagnosis not present

## 2020-01-14 DIAGNOSIS — N186 End stage renal disease: Secondary | ICD-10-CM | POA: Diagnosis not present

## 2020-01-14 DIAGNOSIS — E8809 Other disorders of plasma-protein metabolism, not elsewhere classified: Secondary | ICD-10-CM | POA: Diagnosis not present

## 2020-01-15 DIAGNOSIS — N186 End stage renal disease: Secondary | ICD-10-CM | POA: Diagnosis not present

## 2020-01-15 DIAGNOSIS — Z992 Dependence on renal dialysis: Secondary | ICD-10-CM | POA: Diagnosis not present

## 2020-01-16 DIAGNOSIS — Z992 Dependence on renal dialysis: Secondary | ICD-10-CM | POA: Diagnosis not present

## 2020-01-16 DIAGNOSIS — N186 End stage renal disease: Secondary | ICD-10-CM | POA: Diagnosis not present

## 2020-01-17 DIAGNOSIS — N186 End stage renal disease: Secondary | ICD-10-CM | POA: Diagnosis not present

## 2020-01-17 DIAGNOSIS — Z992 Dependence on renal dialysis: Secondary | ICD-10-CM | POA: Diagnosis not present

## 2020-01-18 DIAGNOSIS — N186 End stage renal disease: Secondary | ICD-10-CM | POA: Diagnosis not present

## 2020-01-18 DIAGNOSIS — Z992 Dependence on renal dialysis: Secondary | ICD-10-CM | POA: Diagnosis not present

## 2020-01-19 DIAGNOSIS — R309 Painful micturition, unspecified: Secondary | ICD-10-CM | POA: Diagnosis not present

## 2020-01-19 DIAGNOSIS — Z992 Dependence on renal dialysis: Secondary | ICD-10-CM | POA: Diagnosis not present

## 2020-01-19 DIAGNOSIS — R300 Dysuria: Secondary | ICD-10-CM | POA: Diagnosis not present

## 2020-01-19 DIAGNOSIS — N186 End stage renal disease: Secondary | ICD-10-CM | POA: Diagnosis not present

## 2020-01-20 DIAGNOSIS — Z992 Dependence on renal dialysis: Secondary | ICD-10-CM | POA: Diagnosis not present

## 2020-01-20 DIAGNOSIS — N186 End stage renal disease: Secondary | ICD-10-CM | POA: Diagnosis not present

## 2020-01-21 DIAGNOSIS — Z992 Dependence on renal dialysis: Secondary | ICD-10-CM | POA: Diagnosis not present

## 2020-01-21 DIAGNOSIS — N186 End stage renal disease: Secondary | ICD-10-CM | POA: Diagnosis not present

## 2020-01-25 DIAGNOSIS — N186 End stage renal disease: Secondary | ICD-10-CM | POA: Diagnosis not present

## 2020-01-25 DIAGNOSIS — Z992 Dependence on renal dialysis: Secondary | ICD-10-CM | POA: Diagnosis not present

## 2020-01-26 DIAGNOSIS — Z13220 Encounter for screening for lipoid disorders: Secondary | ICD-10-CM | POA: Diagnosis not present

## 2020-01-26 DIAGNOSIS — Z992 Dependence on renal dialysis: Secondary | ICD-10-CM | POA: Diagnosis not present

## 2020-01-26 DIAGNOSIS — N186 End stage renal disease: Secondary | ICD-10-CM | POA: Diagnosis not present

## 2020-01-27 DIAGNOSIS — N186 End stage renal disease: Secondary | ICD-10-CM | POA: Diagnosis not present

## 2020-01-27 DIAGNOSIS — Z992 Dependence on renal dialysis: Secondary | ICD-10-CM | POA: Diagnosis not present

## 2020-01-28 DIAGNOSIS — N186 End stage renal disease: Secondary | ICD-10-CM | POA: Diagnosis not present

## 2020-01-28 DIAGNOSIS — Z992 Dependence on renal dialysis: Secondary | ICD-10-CM | POA: Diagnosis not present

## 2020-01-29 DIAGNOSIS — N186 End stage renal disease: Secondary | ICD-10-CM | POA: Diagnosis not present

## 2020-01-29 DIAGNOSIS — Z992 Dependence on renal dialysis: Secondary | ICD-10-CM | POA: Diagnosis not present

## 2020-01-30 DIAGNOSIS — Z992 Dependence on renal dialysis: Secondary | ICD-10-CM | POA: Diagnosis not present

## 2020-01-30 DIAGNOSIS — N186 End stage renal disease: Secondary | ICD-10-CM | POA: Diagnosis not present

## 2020-01-31 DIAGNOSIS — Z992 Dependence on renal dialysis: Secondary | ICD-10-CM | POA: Diagnosis not present

## 2020-01-31 DIAGNOSIS — N186 End stage renal disease: Secondary | ICD-10-CM | POA: Diagnosis not present

## 2020-02-01 DIAGNOSIS — Z992 Dependence on renal dialysis: Secondary | ICD-10-CM | POA: Diagnosis not present

## 2020-02-01 DIAGNOSIS — N186 End stage renal disease: Secondary | ICD-10-CM | POA: Diagnosis not present

## 2020-02-02 DIAGNOSIS — Z992 Dependence on renal dialysis: Secondary | ICD-10-CM | POA: Diagnosis not present

## 2020-02-02 DIAGNOSIS — N186 End stage renal disease: Secondary | ICD-10-CM | POA: Diagnosis not present

## 2020-02-03 DIAGNOSIS — N186 End stage renal disease: Secondary | ICD-10-CM | POA: Diagnosis not present

## 2020-02-03 DIAGNOSIS — Z992 Dependence on renal dialysis: Secondary | ICD-10-CM | POA: Diagnosis not present

## 2020-02-04 DIAGNOSIS — Z992 Dependence on renal dialysis: Secondary | ICD-10-CM | POA: Diagnosis not present

## 2020-02-04 DIAGNOSIS — N186 End stage renal disease: Secondary | ICD-10-CM | POA: Diagnosis not present

## 2020-02-05 DIAGNOSIS — N186 End stage renal disease: Secondary | ICD-10-CM | POA: Diagnosis not present

## 2020-02-05 DIAGNOSIS — Z992 Dependence on renal dialysis: Secondary | ICD-10-CM | POA: Diagnosis not present

## 2020-02-06 DIAGNOSIS — N186 End stage renal disease: Secondary | ICD-10-CM | POA: Diagnosis not present

## 2020-02-06 DIAGNOSIS — Z992 Dependence on renal dialysis: Secondary | ICD-10-CM | POA: Diagnosis not present

## 2020-02-07 DIAGNOSIS — Z992 Dependence on renal dialysis: Secondary | ICD-10-CM | POA: Diagnosis not present

## 2020-02-07 DIAGNOSIS — N186 End stage renal disease: Secondary | ICD-10-CM | POA: Diagnosis not present

## 2020-02-08 DIAGNOSIS — Z992 Dependence on renal dialysis: Secondary | ICD-10-CM | POA: Diagnosis not present

## 2020-02-08 DIAGNOSIS — N186 End stage renal disease: Secondary | ICD-10-CM | POA: Diagnosis not present

## 2020-02-09 DIAGNOSIS — N186 End stage renal disease: Secondary | ICD-10-CM | POA: Diagnosis not present

## 2020-02-09 DIAGNOSIS — Z992 Dependence on renal dialysis: Secondary | ICD-10-CM | POA: Diagnosis not present

## 2020-02-10 DIAGNOSIS — Z992 Dependence on renal dialysis: Secondary | ICD-10-CM | POA: Diagnosis not present

## 2020-02-10 DIAGNOSIS — N186 End stage renal disease: Secondary | ICD-10-CM | POA: Diagnosis not present

## 2020-02-11 DIAGNOSIS — Z992 Dependence on renal dialysis: Secondary | ICD-10-CM | POA: Diagnosis not present

## 2020-02-11 DIAGNOSIS — N186 End stage renal disease: Secondary | ICD-10-CM | POA: Diagnosis not present

## 2020-02-12 DIAGNOSIS — Z992 Dependence on renal dialysis: Secondary | ICD-10-CM | POA: Diagnosis not present

## 2020-02-12 DIAGNOSIS — N186 End stage renal disease: Secondary | ICD-10-CM | POA: Diagnosis not present

## 2020-02-13 DIAGNOSIS — N186 End stage renal disease: Secondary | ICD-10-CM | POA: Diagnosis not present

## 2020-02-13 DIAGNOSIS — Z992 Dependence on renal dialysis: Secondary | ICD-10-CM | POA: Diagnosis not present

## 2020-02-14 DIAGNOSIS — N186 End stage renal disease: Secondary | ICD-10-CM | POA: Diagnosis not present

## 2020-02-14 DIAGNOSIS — Z992 Dependence on renal dialysis: Secondary | ICD-10-CM | POA: Diagnosis not present

## 2020-02-15 DIAGNOSIS — Z992 Dependence on renal dialysis: Secondary | ICD-10-CM | POA: Diagnosis not present

## 2020-02-15 DIAGNOSIS — N186 End stage renal disease: Secondary | ICD-10-CM | POA: Diagnosis not present

## 2020-02-16 DIAGNOSIS — N186 End stage renal disease: Secondary | ICD-10-CM | POA: Diagnosis not present

## 2020-02-16 DIAGNOSIS — Z992 Dependence on renal dialysis: Secondary | ICD-10-CM | POA: Diagnosis not present

## 2020-02-17 DIAGNOSIS — Z992 Dependence on renal dialysis: Secondary | ICD-10-CM | POA: Diagnosis not present

## 2020-02-17 DIAGNOSIS — N186 End stage renal disease: Secondary | ICD-10-CM | POA: Diagnosis not present

## 2020-02-18 DIAGNOSIS — Z992 Dependence on renal dialysis: Secondary | ICD-10-CM | POA: Diagnosis not present

## 2020-02-18 DIAGNOSIS — N186 End stage renal disease: Secondary | ICD-10-CM | POA: Diagnosis not present

## 2020-02-19 DIAGNOSIS — Z992 Dependence on renal dialysis: Secondary | ICD-10-CM | POA: Diagnosis not present

## 2020-02-19 DIAGNOSIS — N186 End stage renal disease: Secondary | ICD-10-CM | POA: Diagnosis not present

## 2020-02-20 DIAGNOSIS — Z992 Dependence on renal dialysis: Secondary | ICD-10-CM | POA: Diagnosis not present

## 2020-02-20 DIAGNOSIS — N186 End stage renal disease: Secondary | ICD-10-CM | POA: Diagnosis not present

## 2020-02-21 DIAGNOSIS — N186 End stage renal disease: Secondary | ICD-10-CM | POA: Diagnosis not present

## 2020-02-21 DIAGNOSIS — Z992 Dependence on renal dialysis: Secondary | ICD-10-CM | POA: Diagnosis not present

## 2020-02-22 DIAGNOSIS — N186 End stage renal disease: Secondary | ICD-10-CM | POA: Diagnosis not present

## 2020-02-22 DIAGNOSIS — Z992 Dependence on renal dialysis: Secondary | ICD-10-CM | POA: Diagnosis not present

## 2020-02-23 DIAGNOSIS — Z992 Dependence on renal dialysis: Secondary | ICD-10-CM | POA: Diagnosis not present

## 2020-02-23 DIAGNOSIS — N186 End stage renal disease: Secondary | ICD-10-CM | POA: Diagnosis not present

## 2020-02-24 DIAGNOSIS — Z992 Dependence on renal dialysis: Secondary | ICD-10-CM | POA: Diagnosis not present

## 2020-02-24 DIAGNOSIS — N186 End stage renal disease: Secondary | ICD-10-CM | POA: Diagnosis not present

## 2020-02-25 DIAGNOSIS — N186 End stage renal disease: Secondary | ICD-10-CM | POA: Diagnosis not present

## 2020-02-25 DIAGNOSIS — Z992 Dependence on renal dialysis: Secondary | ICD-10-CM | POA: Diagnosis not present

## 2020-02-26 DIAGNOSIS — N186 End stage renal disease: Secondary | ICD-10-CM | POA: Diagnosis not present

## 2020-02-26 DIAGNOSIS — Z992 Dependence on renal dialysis: Secondary | ICD-10-CM | POA: Diagnosis not present

## 2020-02-27 DIAGNOSIS — N186 End stage renal disease: Secondary | ICD-10-CM | POA: Diagnosis not present

## 2020-02-27 DIAGNOSIS — Z992 Dependence on renal dialysis: Secondary | ICD-10-CM | POA: Diagnosis not present

## 2020-02-28 DIAGNOSIS — Z992 Dependence on renal dialysis: Secondary | ICD-10-CM | POA: Diagnosis not present

## 2020-02-28 DIAGNOSIS — N186 End stage renal disease: Secondary | ICD-10-CM | POA: Diagnosis not present

## 2020-02-29 DIAGNOSIS — Z992 Dependence on renal dialysis: Secondary | ICD-10-CM | POA: Diagnosis not present

## 2020-02-29 DIAGNOSIS — N186 End stage renal disease: Secondary | ICD-10-CM | POA: Diagnosis not present

## 2020-03-01 DIAGNOSIS — N186 End stage renal disease: Secondary | ICD-10-CM | POA: Diagnosis not present

## 2020-03-01 DIAGNOSIS — Z992 Dependence on renal dialysis: Secondary | ICD-10-CM | POA: Diagnosis not present

## 2020-03-02 DIAGNOSIS — Z992 Dependence on renal dialysis: Secondary | ICD-10-CM | POA: Diagnosis not present

## 2020-03-02 DIAGNOSIS — N186 End stage renal disease: Secondary | ICD-10-CM | POA: Diagnosis not present

## 2020-03-03 DIAGNOSIS — N186 End stage renal disease: Secondary | ICD-10-CM | POA: Diagnosis not present

## 2020-03-03 DIAGNOSIS — Z992 Dependence on renal dialysis: Secondary | ICD-10-CM | POA: Diagnosis not present

## 2020-03-04 DIAGNOSIS — Z992 Dependence on renal dialysis: Secondary | ICD-10-CM | POA: Diagnosis not present

## 2020-03-04 DIAGNOSIS — N186 End stage renal disease: Secondary | ICD-10-CM | POA: Diagnosis not present

## 2020-03-05 DIAGNOSIS — Z992 Dependence on renal dialysis: Secondary | ICD-10-CM | POA: Diagnosis not present

## 2020-03-05 DIAGNOSIS — N186 End stage renal disease: Secondary | ICD-10-CM | POA: Diagnosis not present

## 2020-03-06 DIAGNOSIS — N186 End stage renal disease: Secondary | ICD-10-CM | POA: Diagnosis not present

## 2020-03-06 DIAGNOSIS — Z992 Dependence on renal dialysis: Secondary | ICD-10-CM | POA: Diagnosis not present

## 2020-03-07 DIAGNOSIS — N186 End stage renal disease: Secondary | ICD-10-CM | POA: Diagnosis not present

## 2020-03-07 DIAGNOSIS — Z992 Dependence on renal dialysis: Secondary | ICD-10-CM | POA: Diagnosis not present

## 2020-03-08 DIAGNOSIS — Z992 Dependence on renal dialysis: Secondary | ICD-10-CM | POA: Diagnosis not present

## 2020-03-08 DIAGNOSIS — N186 End stage renal disease: Secondary | ICD-10-CM | POA: Diagnosis not present

## 2020-03-09 DIAGNOSIS — N186 End stage renal disease: Secondary | ICD-10-CM | POA: Diagnosis not present

## 2020-03-09 DIAGNOSIS — Z992 Dependence on renal dialysis: Secondary | ICD-10-CM | POA: Diagnosis not present

## 2020-03-10 DIAGNOSIS — N186 End stage renal disease: Secondary | ICD-10-CM | POA: Diagnosis not present

## 2020-03-10 DIAGNOSIS — Z992 Dependence on renal dialysis: Secondary | ICD-10-CM | POA: Diagnosis not present

## 2020-03-11 DIAGNOSIS — N186 End stage renal disease: Secondary | ICD-10-CM | POA: Diagnosis not present

## 2020-03-11 DIAGNOSIS — Z992 Dependence on renal dialysis: Secondary | ICD-10-CM | POA: Diagnosis not present

## 2020-03-12 DIAGNOSIS — N186 End stage renal disease: Secondary | ICD-10-CM | POA: Diagnosis not present

## 2020-03-12 DIAGNOSIS — Z992 Dependence on renal dialysis: Secondary | ICD-10-CM | POA: Diagnosis not present

## 2020-03-13 DIAGNOSIS — Z992 Dependence on renal dialysis: Secondary | ICD-10-CM | POA: Diagnosis not present

## 2020-03-13 DIAGNOSIS — N186 End stage renal disease: Secondary | ICD-10-CM | POA: Diagnosis not present

## 2020-03-14 DIAGNOSIS — Z992 Dependence on renal dialysis: Secondary | ICD-10-CM | POA: Diagnosis not present

## 2020-03-14 DIAGNOSIS — N186 End stage renal disease: Secondary | ICD-10-CM | POA: Diagnosis not present

## 2020-03-15 DIAGNOSIS — N186 End stage renal disease: Secondary | ICD-10-CM | POA: Diagnosis not present

## 2020-03-15 DIAGNOSIS — Z992 Dependence on renal dialysis: Secondary | ICD-10-CM | POA: Diagnosis not present

## 2020-03-16 DIAGNOSIS — Z992 Dependence on renal dialysis: Secondary | ICD-10-CM | POA: Diagnosis not present

## 2020-03-16 DIAGNOSIS — N186 End stage renal disease: Secondary | ICD-10-CM | POA: Diagnosis not present

## 2020-03-17 DIAGNOSIS — Z992 Dependence on renal dialysis: Secondary | ICD-10-CM | POA: Diagnosis not present

## 2020-03-17 DIAGNOSIS — N186 End stage renal disease: Secondary | ICD-10-CM | POA: Diagnosis not present

## 2020-03-18 DIAGNOSIS — N186 End stage renal disease: Secondary | ICD-10-CM | POA: Diagnosis not present

## 2020-03-18 DIAGNOSIS — Z992 Dependence on renal dialysis: Secondary | ICD-10-CM | POA: Diagnosis not present

## 2020-03-19 DIAGNOSIS — N186 End stage renal disease: Secondary | ICD-10-CM | POA: Diagnosis not present

## 2020-03-19 DIAGNOSIS — Z992 Dependence on renal dialysis: Secondary | ICD-10-CM | POA: Diagnosis not present

## 2020-03-20 DIAGNOSIS — Z992 Dependence on renal dialysis: Secondary | ICD-10-CM | POA: Diagnosis not present

## 2020-03-20 DIAGNOSIS — N186 End stage renal disease: Secondary | ICD-10-CM | POA: Diagnosis not present

## 2020-03-21 DIAGNOSIS — Z992 Dependence on renal dialysis: Secondary | ICD-10-CM | POA: Diagnosis not present

## 2020-03-21 DIAGNOSIS — N186 End stage renal disease: Secondary | ICD-10-CM | POA: Diagnosis not present

## 2020-03-22 DIAGNOSIS — N186 End stage renal disease: Secondary | ICD-10-CM | POA: Diagnosis not present

## 2020-03-22 DIAGNOSIS — Z992 Dependence on renal dialysis: Secondary | ICD-10-CM | POA: Diagnosis not present

## 2020-03-23 DIAGNOSIS — Z992 Dependence on renal dialysis: Secondary | ICD-10-CM | POA: Diagnosis not present

## 2020-03-23 DIAGNOSIS — N186 End stage renal disease: Secondary | ICD-10-CM | POA: Diagnosis not present

## 2020-03-24 DIAGNOSIS — N186 End stage renal disease: Secondary | ICD-10-CM | POA: Diagnosis not present

## 2020-03-24 DIAGNOSIS — Z992 Dependence on renal dialysis: Secondary | ICD-10-CM | POA: Diagnosis not present

## 2020-03-25 DIAGNOSIS — N186 End stage renal disease: Secondary | ICD-10-CM | POA: Diagnosis not present

## 2020-03-25 DIAGNOSIS — Z992 Dependence on renal dialysis: Secondary | ICD-10-CM | POA: Diagnosis not present

## 2020-03-26 DIAGNOSIS — Z992 Dependence on renal dialysis: Secondary | ICD-10-CM | POA: Diagnosis not present

## 2020-03-26 DIAGNOSIS — N186 End stage renal disease: Secondary | ICD-10-CM | POA: Diagnosis not present

## 2020-03-27 DIAGNOSIS — Z992 Dependence on renal dialysis: Secondary | ICD-10-CM | POA: Diagnosis not present

## 2020-03-27 DIAGNOSIS — N186 End stage renal disease: Secondary | ICD-10-CM | POA: Diagnosis not present

## 2020-03-28 DIAGNOSIS — N186 End stage renal disease: Secondary | ICD-10-CM | POA: Diagnosis not present

## 2020-03-28 DIAGNOSIS — Z992 Dependence on renal dialysis: Secondary | ICD-10-CM | POA: Diagnosis not present

## 2020-03-29 DIAGNOSIS — Z992 Dependence on renal dialysis: Secondary | ICD-10-CM | POA: Diagnosis not present

## 2020-03-29 DIAGNOSIS — N186 End stage renal disease: Secondary | ICD-10-CM | POA: Diagnosis not present

## 2020-03-30 DIAGNOSIS — Z992 Dependence on renal dialysis: Secondary | ICD-10-CM | POA: Diagnosis not present

## 2020-03-30 DIAGNOSIS — N186 End stage renal disease: Secondary | ICD-10-CM | POA: Diagnosis not present

## 2020-03-31 DIAGNOSIS — N186 End stage renal disease: Secondary | ICD-10-CM | POA: Diagnosis not present

## 2020-03-31 DIAGNOSIS — Z992 Dependence on renal dialysis: Secondary | ICD-10-CM | POA: Diagnosis not present

## 2020-04-01 DIAGNOSIS — N186 End stage renal disease: Secondary | ICD-10-CM | POA: Diagnosis not present

## 2020-04-01 DIAGNOSIS — Z992 Dependence on renal dialysis: Secondary | ICD-10-CM | POA: Diagnosis not present

## 2020-04-02 DIAGNOSIS — Z992 Dependence on renal dialysis: Secondary | ICD-10-CM | POA: Diagnosis not present

## 2020-04-02 DIAGNOSIS — N186 End stage renal disease: Secondary | ICD-10-CM | POA: Diagnosis not present

## 2020-04-03 DIAGNOSIS — Z992 Dependence on renal dialysis: Secondary | ICD-10-CM | POA: Diagnosis not present

## 2020-04-03 DIAGNOSIS — N186 End stage renal disease: Secondary | ICD-10-CM | POA: Diagnosis not present

## 2020-04-04 DIAGNOSIS — Z992 Dependence on renal dialysis: Secondary | ICD-10-CM | POA: Diagnosis not present

## 2020-04-04 DIAGNOSIS — N186 End stage renal disease: Secondary | ICD-10-CM | POA: Diagnosis not present

## 2020-04-05 DIAGNOSIS — N186 End stage renal disease: Secondary | ICD-10-CM | POA: Diagnosis not present

## 2020-04-05 DIAGNOSIS — Z992 Dependence on renal dialysis: Secondary | ICD-10-CM | POA: Diagnosis not present

## 2020-04-06 DIAGNOSIS — N186 End stage renal disease: Secondary | ICD-10-CM | POA: Diagnosis not present

## 2020-04-06 DIAGNOSIS — Z992 Dependence on renal dialysis: Secondary | ICD-10-CM | POA: Diagnosis not present

## 2020-04-07 DIAGNOSIS — Z992 Dependence on renal dialysis: Secondary | ICD-10-CM | POA: Diagnosis not present

## 2020-04-07 DIAGNOSIS — N186 End stage renal disease: Secondary | ICD-10-CM | POA: Diagnosis not present

## 2020-04-08 DIAGNOSIS — N186 End stage renal disease: Secondary | ICD-10-CM | POA: Diagnosis not present

## 2020-04-08 DIAGNOSIS — Z992 Dependence on renal dialysis: Secondary | ICD-10-CM | POA: Diagnosis not present

## 2020-04-09 DIAGNOSIS — N186 End stage renal disease: Secondary | ICD-10-CM | POA: Diagnosis not present

## 2020-04-09 DIAGNOSIS — Z992 Dependence on renal dialysis: Secondary | ICD-10-CM | POA: Diagnosis not present

## 2020-04-10 DIAGNOSIS — Z992 Dependence on renal dialysis: Secondary | ICD-10-CM | POA: Diagnosis not present

## 2020-04-10 DIAGNOSIS — N186 End stage renal disease: Secondary | ICD-10-CM | POA: Diagnosis not present

## 2020-04-18 DIAGNOSIS — Z992 Dependence on renal dialysis: Secondary | ICD-10-CM | POA: Diagnosis not present

## 2020-04-18 DIAGNOSIS — N186 End stage renal disease: Secondary | ICD-10-CM | POA: Diagnosis not present

## 2020-04-19 DIAGNOSIS — N186 End stage renal disease: Secondary | ICD-10-CM | POA: Diagnosis not present

## 2020-04-19 DIAGNOSIS — Z992 Dependence on renal dialysis: Secondary | ICD-10-CM | POA: Diagnosis not present

## 2020-04-20 DIAGNOSIS — N186 End stage renal disease: Secondary | ICD-10-CM | POA: Diagnosis not present

## 2020-04-20 DIAGNOSIS — Z992 Dependence on renal dialysis: Secondary | ICD-10-CM | POA: Diagnosis not present

## 2020-04-21 DIAGNOSIS — Z992 Dependence on renal dialysis: Secondary | ICD-10-CM | POA: Diagnosis not present

## 2020-04-21 DIAGNOSIS — N186 End stage renal disease: Secondary | ICD-10-CM | POA: Diagnosis not present

## 2020-04-22 DIAGNOSIS — Z992 Dependence on renal dialysis: Secondary | ICD-10-CM | POA: Diagnosis not present

## 2020-04-22 DIAGNOSIS — N186 End stage renal disease: Secondary | ICD-10-CM | POA: Diagnosis not present

## 2020-04-23 DIAGNOSIS — N186 End stage renal disease: Secondary | ICD-10-CM | POA: Diagnosis not present

## 2020-04-23 DIAGNOSIS — Z992 Dependence on renal dialysis: Secondary | ICD-10-CM | POA: Diagnosis not present

## 2020-04-24 DIAGNOSIS — N186 End stage renal disease: Secondary | ICD-10-CM | POA: Diagnosis not present

## 2020-04-24 DIAGNOSIS — Z992 Dependence on renal dialysis: Secondary | ICD-10-CM | POA: Diagnosis not present

## 2020-04-25 DIAGNOSIS — N186 End stage renal disease: Secondary | ICD-10-CM | POA: Diagnosis not present

## 2020-04-25 DIAGNOSIS — Z992 Dependence on renal dialysis: Secondary | ICD-10-CM | POA: Diagnosis not present

## 2020-04-26 DIAGNOSIS — N186 End stage renal disease: Secondary | ICD-10-CM | POA: Diagnosis not present

## 2020-04-26 DIAGNOSIS — Z992 Dependence on renal dialysis: Secondary | ICD-10-CM | POA: Diagnosis not present

## 2020-05-09 DIAGNOSIS — N186 End stage renal disease: Secondary | ICD-10-CM | POA: Diagnosis not present

## 2020-05-09 DIAGNOSIS — Z992 Dependence on renal dialysis: Secondary | ICD-10-CM | POA: Diagnosis not present

## 2020-05-09 DIAGNOSIS — Z13220 Encounter for screening for lipoid disorders: Secondary | ICD-10-CM | POA: Diagnosis not present

## 2020-05-10 DIAGNOSIS — N186 End stage renal disease: Secondary | ICD-10-CM | POA: Diagnosis not present

## 2020-05-10 DIAGNOSIS — Z992 Dependence on renal dialysis: Secondary | ICD-10-CM | POA: Diagnosis not present

## 2020-05-11 ENCOUNTER — Telehealth: Payer: Self-pay

## 2020-05-11 DIAGNOSIS — Z992 Dependence on renal dialysis: Secondary | ICD-10-CM | POA: Diagnosis not present

## 2020-05-11 DIAGNOSIS — N186 End stage renal disease: Secondary | ICD-10-CM | POA: Diagnosis not present

## 2020-05-11 NOTE — Telephone Encounter (Signed)
Denise with [facility] division left v/m that [facility] is recommending palliative care with [facility] services. Langley Gauss wants to know if in agreement with Palliative care starting in conjunction with [facility] is OK with Avie Echevaria NP as PCP. Denise request cb. Pt seen for HFU on 01/15/19 and last medicare wellness was 03/11/2017.

## 2020-05-12 DIAGNOSIS — N186 End stage renal disease: Secondary | ICD-10-CM | POA: Diagnosis not present

## 2020-05-12 DIAGNOSIS — Z992 Dependence on renal dialysis: Secondary | ICD-10-CM | POA: Diagnosis not present

## 2020-05-12 NOTE — Telephone Encounter (Signed)
This pt has not been seen in over a year. She has to be seen before I can agree to any of these services.

## 2020-05-12 NOTE — Telephone Encounter (Signed)
I spoke to [facility] with [facility] and let them know pt has not been seen in over 1 1/2 ear and last CPE was 2018... Garnette Gunner will not be able to provide VO for services

## 2020-05-13 DIAGNOSIS — N186 End stage renal disease: Secondary | ICD-10-CM | POA: Diagnosis not present

## 2020-05-13 DIAGNOSIS — Z992 Dependence on renal dialysis: Secondary | ICD-10-CM | POA: Diagnosis not present

## 2020-05-14 DIAGNOSIS — N186 End stage renal disease: Secondary | ICD-10-CM | POA: Diagnosis not present

## 2020-05-14 DIAGNOSIS — Z992 Dependence on renal dialysis: Secondary | ICD-10-CM | POA: Diagnosis not present

## 2020-05-15 DIAGNOSIS — N186 End stage renal disease: Secondary | ICD-10-CM | POA: Diagnosis not present

## 2020-05-15 DIAGNOSIS — Z992 Dependence on renal dialysis: Secondary | ICD-10-CM | POA: Diagnosis not present

## 2020-05-16 DIAGNOSIS — N186 End stage renal disease: Secondary | ICD-10-CM | POA: Diagnosis not present

## 2020-05-16 DIAGNOSIS — Z992 Dependence on renal dialysis: Secondary | ICD-10-CM | POA: Diagnosis not present

## 2020-05-17 DIAGNOSIS — N186 End stage renal disease: Secondary | ICD-10-CM | POA: Diagnosis not present

## 2020-05-17 DIAGNOSIS — Z992 Dependence on renal dialysis: Secondary | ICD-10-CM | POA: Diagnosis not present

## 2020-05-18 DIAGNOSIS — Z992 Dependence on renal dialysis: Secondary | ICD-10-CM | POA: Diagnosis not present

## 2020-05-18 DIAGNOSIS — N186 End stage renal disease: Secondary | ICD-10-CM | POA: Diagnosis not present

## 2020-05-19 DIAGNOSIS — Z992 Dependence on renal dialysis: Secondary | ICD-10-CM | POA: Diagnosis not present

## 2020-05-19 DIAGNOSIS — D631 Anemia in chronic kidney disease: Secondary | ICD-10-CM | POA: Diagnosis not present

## 2020-05-19 DIAGNOSIS — C9110 Chronic lymphocytic leukemia of B-cell type not having achieved remission: Secondary | ICD-10-CM | POA: Diagnosis not present

## 2020-05-19 DIAGNOSIS — E46 Unspecified protein-calorie malnutrition: Secondary | ICD-10-CM | POA: Diagnosis not present

## 2020-05-19 DIAGNOSIS — I679 Cerebrovascular disease, unspecified: Secondary | ICD-10-CM | POA: Diagnosis not present

## 2020-05-19 DIAGNOSIS — S31819A Unspecified open wound of right buttock, initial encounter: Secondary | ICD-10-CM | POA: Diagnosis not present

## 2020-05-19 DIAGNOSIS — D696 Thrombocytopenia, unspecified: Secondary | ICD-10-CM | POA: Diagnosis not present

## 2020-05-19 DIAGNOSIS — E1151 Type 2 diabetes mellitus with diabetic peripheral angiopathy without gangrene: Secondary | ICD-10-CM | POA: Diagnosis not present

## 2020-05-19 DIAGNOSIS — N390 Urinary tract infection, site not specified: Secondary | ICD-10-CM | POA: Diagnosis not present

## 2020-05-19 DIAGNOSIS — Q613 Polycystic kidney, unspecified: Secondary | ICD-10-CM | POA: Diagnosis not present

## 2020-05-19 DIAGNOSIS — Z7902 Long term (current) use of antithrombotics/antiplatelets: Secondary | ICD-10-CM | POA: Diagnosis not present

## 2020-05-19 DIAGNOSIS — D8481 Immunodeficiency due to conditions classified elsewhere: Secondary | ICD-10-CM | POA: Diagnosis not present

## 2020-05-19 DIAGNOSIS — N2581 Secondary hyperparathyroidism of renal origin: Secondary | ICD-10-CM | POA: Diagnosis not present

## 2020-05-19 DIAGNOSIS — N186 End stage renal disease: Secondary | ICD-10-CM | POA: Diagnosis not present

## 2020-05-20 DIAGNOSIS — N186 End stage renal disease: Secondary | ICD-10-CM | POA: Diagnosis not present

## 2020-05-20 DIAGNOSIS — Z992 Dependence on renal dialysis: Secondary | ICD-10-CM | POA: Diagnosis not present

## 2020-05-21 DIAGNOSIS — Z992 Dependence on renal dialysis: Secondary | ICD-10-CM | POA: Diagnosis not present

## 2020-05-21 DIAGNOSIS — N186 End stage renal disease: Secondary | ICD-10-CM | POA: Diagnosis not present

## 2020-05-22 DIAGNOSIS — Z992 Dependence on renal dialysis: Secondary | ICD-10-CM | POA: Diagnosis not present

## 2020-05-22 DIAGNOSIS — N186 End stage renal disease: Secondary | ICD-10-CM | POA: Diagnosis not present

## 2020-05-23 DIAGNOSIS — Z992 Dependence on renal dialysis: Secondary | ICD-10-CM | POA: Diagnosis not present

## 2020-05-23 DIAGNOSIS — N186 End stage renal disease: Secondary | ICD-10-CM | POA: Diagnosis not present

## 2020-05-24 DIAGNOSIS — Z992 Dependence on renal dialysis: Secondary | ICD-10-CM | POA: Diagnosis not present

## 2020-05-24 DIAGNOSIS — N186 End stage renal disease: Secondary | ICD-10-CM | POA: Diagnosis not present

## 2020-05-25 DIAGNOSIS — N186 End stage renal disease: Secondary | ICD-10-CM | POA: Diagnosis not present

## 2020-05-25 DIAGNOSIS — Z992 Dependence on renal dialysis: Secondary | ICD-10-CM | POA: Diagnosis not present

## 2020-05-26 DIAGNOSIS — N186 End stage renal disease: Secondary | ICD-10-CM | POA: Diagnosis not present

## 2020-05-26 DIAGNOSIS — Z992 Dependence on renal dialysis: Secondary | ICD-10-CM | POA: Diagnosis not present

## 2020-05-27 DIAGNOSIS — Z992 Dependence on renal dialysis: Secondary | ICD-10-CM | POA: Diagnosis not present

## 2020-05-27 DIAGNOSIS — N186 End stage renal disease: Secondary | ICD-10-CM | POA: Diagnosis not present

## 2020-05-28 DIAGNOSIS — N186 End stage renal disease: Secondary | ICD-10-CM | POA: Diagnosis not present

## 2020-05-28 DIAGNOSIS — Z992 Dependence on renal dialysis: Secondary | ICD-10-CM | POA: Diagnosis not present

## 2020-05-29 DIAGNOSIS — N186 End stage renal disease: Secondary | ICD-10-CM | POA: Diagnosis not present

## 2020-05-29 DIAGNOSIS — Z992 Dependence on renal dialysis: Secondary | ICD-10-CM | POA: Diagnosis not present

## 2020-05-30 DIAGNOSIS — Z992 Dependence on renal dialysis: Secondary | ICD-10-CM | POA: Diagnosis not present

## 2020-05-30 DIAGNOSIS — N186 End stage renal disease: Secondary | ICD-10-CM | POA: Diagnosis not present

## 2020-05-31 DIAGNOSIS — Z992 Dependence on renal dialysis: Secondary | ICD-10-CM | POA: Diagnosis not present

## 2020-05-31 DIAGNOSIS — N186 End stage renal disease: Secondary | ICD-10-CM | POA: Diagnosis not present

## 2020-06-01 DIAGNOSIS — Z992 Dependence on renal dialysis: Secondary | ICD-10-CM | POA: Diagnosis not present

## 2020-06-01 DIAGNOSIS — N186 End stage renal disease: Secondary | ICD-10-CM | POA: Diagnosis not present

## 2020-06-01 MED ORDER — LIDOCAINE 5 % TOPICAL OINTMENT
TOPICAL | 0 days
Start: 2020-06-01 — End: ?

## 2020-06-02 DIAGNOSIS — N186 End stage renal disease: Secondary | ICD-10-CM | POA: Diagnosis not present

## 2020-06-02 DIAGNOSIS — Z992 Dependence on renal dialysis: Secondary | ICD-10-CM | POA: Diagnosis not present

## 2020-06-03 DIAGNOSIS — N186 End stage renal disease: Secondary | ICD-10-CM | POA: Diagnosis not present

## 2020-06-03 DIAGNOSIS — Z992 Dependence on renal dialysis: Secondary | ICD-10-CM | POA: Diagnosis not present

## 2020-06-04 DIAGNOSIS — Z992 Dependence on renal dialysis: Secondary | ICD-10-CM | POA: Diagnosis not present

## 2020-06-04 DIAGNOSIS — N186 End stage renal disease: Secondary | ICD-10-CM | POA: Diagnosis not present

## 2020-06-05 DIAGNOSIS — Z992 Dependence on renal dialysis: Secondary | ICD-10-CM | POA: Diagnosis not present

## 2020-06-05 DIAGNOSIS — N186 End stage renal disease: Secondary | ICD-10-CM | POA: Diagnosis not present

## 2020-06-06 DIAGNOSIS — Z992 Dependence on renal dialysis: Secondary | ICD-10-CM | POA: Diagnosis not present

## 2020-06-06 DIAGNOSIS — N186 End stage renal disease: Secondary | ICD-10-CM | POA: Diagnosis not present

## 2020-06-07 DIAGNOSIS — Z992 Dependence on renal dialysis: Secondary | ICD-10-CM | POA: Diagnosis not present

## 2020-06-07 DIAGNOSIS — N186 End stage renal disease: Secondary | ICD-10-CM | POA: Diagnosis not present

## 2020-06-08 DIAGNOSIS — Z992 Dependence on renal dialysis: Secondary | ICD-10-CM | POA: Diagnosis not present

## 2020-06-08 DIAGNOSIS — N186 End stage renal disease: Secondary | ICD-10-CM | POA: Diagnosis not present

## 2020-06-09 DIAGNOSIS — N186 End stage renal disease: Secondary | ICD-10-CM | POA: Diagnosis not present

## 2020-06-09 DIAGNOSIS — Z992 Dependence on renal dialysis: Secondary | ICD-10-CM | POA: Diagnosis not present

## 2020-06-10 DIAGNOSIS — N186 End stage renal disease: Secondary | ICD-10-CM | POA: Diagnosis not present

## 2020-06-10 DIAGNOSIS — Z992 Dependence on renal dialysis: Secondary | ICD-10-CM | POA: Diagnosis not present

## 2020-06-11 DIAGNOSIS — Z992 Dependence on renal dialysis: Secondary | ICD-10-CM | POA: Diagnosis not present

## 2020-06-11 DIAGNOSIS — N186 End stage renal disease: Secondary | ICD-10-CM | POA: Diagnosis not present

## 2020-06-12 DIAGNOSIS — N186 End stage renal disease: Secondary | ICD-10-CM | POA: Diagnosis not present

## 2020-06-12 DIAGNOSIS — Z992 Dependence on renal dialysis: Secondary | ICD-10-CM | POA: Diagnosis not present

## 2020-06-13 DIAGNOSIS — N186 End stage renal disease: Secondary | ICD-10-CM | POA: Diagnosis not present

## 2020-06-13 DIAGNOSIS — Z992 Dependence on renal dialysis: Secondary | ICD-10-CM | POA: Diagnosis not present

## 2020-06-14 DIAGNOSIS — N186 End stage renal disease: Secondary | ICD-10-CM | POA: Diagnosis not present

## 2020-06-14 DIAGNOSIS — Z992 Dependence on renal dialysis: Secondary | ICD-10-CM | POA: Diagnosis not present

## 2020-06-15 DIAGNOSIS — Z992 Dependence on renal dialysis: Secondary | ICD-10-CM | POA: Diagnosis not present

## 2020-06-15 DIAGNOSIS — N186 End stage renal disease: Secondary | ICD-10-CM | POA: Diagnosis not present

## 2020-06-16 ENCOUNTER — Ambulatory Visit (INDEPENDENT_AMBULATORY_CARE_PROVIDER_SITE_OTHER): Payer: Medicare HMO | Admitting: Internal Medicine

## 2020-06-16 ENCOUNTER — Encounter: Payer: Self-pay | Admitting: Internal Medicine

## 2020-06-16 ENCOUNTER — Other Ambulatory Visit: Payer: Self-pay

## 2020-06-16 VITALS — BP 168/80 | HR 95 | Ht 60.0 in | Wt 130.0 lb

## 2020-06-16 DIAGNOSIS — A630 Anogenital (venereal) warts: Secondary | ICD-10-CM | POA: Diagnosis not present

## 2020-06-16 DIAGNOSIS — Z992 Dependence on renal dialysis: Secondary | ICD-10-CM | POA: Diagnosis not present

## 2020-06-16 DIAGNOSIS — N186 End stage renal disease: Secondary | ICD-10-CM | POA: Diagnosis not present

## 2020-06-16 NOTE — Progress Notes (Signed)
Subjective:    Patient ID: Christine Gibson, female    DOB: Sep 15, 1951, 69 y.o.   MRN: [id number]  HPI  Pt presents to the clinic today with c/o genital warts around her anus. She reports she had surgery at the [facility] 6/18 for the same. She feels like after her surgery, the viral warts started to spread and get worse. She reports the area is open, draining and she has noticed blood when she wipes. She went to the ER  2 weeks ago at [facility] for the same. She reports she was prescribed oral and topical antiboitic for 10 days, but did not really improve her symptoms. She reports she has an appt with dermatology in 2 weeks and a follow up with her surgeon on 9/26.   Review of Systems      Past Medical History:  Diagnosis Date  . Chronic kidney disease   . Diabetes mellitus without complication ([place])   . History of leukemia   . Hypertension     Current Outpatient Medications  Medication Sig Dispense Refill  . amLODipine (NORVASC) 10 MG tablet Take 1 tablet (10 mg total) by mouth daily. 90 tablet 1  . apixaban (ELIQUIS) 5 MG TABS tablet Take 5 mg by mouth in the morning, at noon, in the evening, and at bedtime.    Marland Kitchen atorvastatin (LIPITOR) 10 MG tablet Take 5 mg by mouth daily.    . calcitRIOL (ROCALTROL) 0.25 MCG capsule Take 0.25 mcg by mouth See admin instructions. Tale on Thursday and Saturday    . carvedilol (COREG) 25 MG tablet Take 25 mg by mouth in the morning and at bedtime.    . CHLORAMBUCIL PO Take by mouth.    . Cyanocobalamin (VITAMIN B-12) 5000 MCG TBDP Take 5,000 mcg by mouth daily.    Marland Kitchen CYPROHEPTADINE HCL PO Take 410 mg by mouth in the morning and at bedtime.    . ferrous sulfate 325 (65 FE) MG EC tablet Take 325 mg by mouth every Monday, Wednesday, and Friday.    . furosemide (LASIX) 40 MG tablet Take 40 mg by mouth 2 (two) times daily.     Marland Kitchen glipiZIDE (GLUCOTROL) 5 MG tablet Take 5 mg by mouth daily before breakfast.    . hydrALAZINE (APRESOLINE) 25 MG tablet Take 25 mg by mouth  3 (three) times daily.     Marland Kitchen ibrutinib (IMBRUVICA) 140 MG capsul Take 140 mg by mouth daily.    Marland Kitchen losartan (COZAAR) 50 MG tablet Take 50 mg by mouth daily.     . meclizine (ANTIVERT) 25 MG tablet Take 1 tablet (25 mg total) by mouth 3 (three) times daily as needed for dizziness. 30 tablet 0  . Multiple Vitamins-Minerals (EYE SUPPORT) TABS Take 1 tablet by mouth daily. Occuvite    . NIFEdipine (ADALAT CC) 60 MG 24 hr tablet Take 60 mg by mouth daily.    . potassium chloride (KLOR-CON) 20 MEQ packet Take 20 mEq by mouth daily.    . Turmeric POWD by Does not apply route. Cook with it    . valACYclovir (VALTREX) 500 MG tablet Take 1 tablet (500 mg total) by mouth 2 (two) times daily. (Patient taking differently: Take 400 mg by mouth 2 (two) times daily. ) 60 tablet 1   No current facility-administered medications for this visit.    Allergies  Allergen Reactions  . Lisinopril Cough    Family History  Problem Relation Age of Onset  . Hyperlipidemia Mother   .  Diabetes Mother   . Diabetes Father   . Hyperlipidemia Father     Social History   Socioeconomic History  . Marital status: Married    Spouse name: Not on file  . Number of children: Not on file  . Years of education: Not on file  . Highest education level: Not on file  Occupational History  . Not on file  Tobacco Use  . Smoking status: Never Smoker  . Smokeless tobacco: Never Used  Vaping Use  . Vaping Use: Never used  Substance and Sexual Activity  . Alcohol use: Yes    Alcohol/week: 0.0 standard drinks    Comment: occassional  . Drug use: No  . Sexual activity: Yes  Other Topics Concern  . Not on file  Social History Narrative   [employer]. L knee pain was service related.  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     Social Determinants of Health   Financial Resource Strain:   . Difficulty of Paying Living Expenses: Not on file  Food Insecurity:   . Worried About Charity fundraiser in the Last Year: Not on file  . Ran Out of  Food in the Last Year: Not on file  Transportation Needs:   . Lack of Transportation (Medical): Not on file  . Lack of Transportation (Non-Medical): Not on file  Physical Activity:   . Days of Exercise per Week: Not on file  . Minutes of Exercise per Session: Not on file  Stress:   . Feeling of Stress : Not on file  Social Connections:   . Frequency of Communication with Friends and Family: Not on file  . Frequency of Social Gatherings with Friends and Family: Not on file  . Attends Religious Services: Not on file  . Active Member of Clubs or Organizations: Not on file  . Attends Archivist Meetings: Not on file  . Marital Status: Not on file  Intimate Partner Violence:   . Fear of Current or Ex-Partner: Not on file  . Emotionally Abused: Not on file  . Physically Abused: Not on file  . Sexually Abused: Not on file     Constitutional: Denies fever, malaise, fatigue, headache or abrupt weight changes.  Respiratory: Denies difficulty breathing, shortness of breath, cough or sputum production.   Cardiovascular: Denies chest pain, chest tightness, palpitations or swelling in the hands or feet.  Skin: Pt reports anal warts. Denies redness, rashes, or ulcercations.    No other specific complaints in a complete review of systems (except as listed in HPI above).  Objective:   Physical Exam BP (!) 168/80 Comment: irregular  Pulse 95   Ht 5' (1.524 m)   Wt 130 lb (59 kg)   LMP  (LMP Unknown)   SpO2 99%   BMI 25.39 kg/m  Wt Readings from Last 3 Encounters:  06/16/20 130 lb (59 kg)  01/15/19 129 lb (58.5 kg)  01/12/19 130 lb (59 kg)    General: Appears her stated age, well developed, well nourished in NAD. Skin: Warm, dry and intact. Grouped/convalescent viral warts noted of gluteal cleft extending down to around anus. Abdomen: Soft and nontender. Normal bowel sounds. No distention or masses noted.  Neurological: Alert and oriented.   BMET    Component Value  Date/Time   NA 137 01/12/2019 0908   K 3.3 (L) 01/12/2019 0908   CL 106 01/12/2019 0908   CO2 18 (L) 01/12/2019 0908   GLUCOSE 229 (H) 01/12/2019 0908   BUN  34 (H) 01/12/2019 0908   CREATININE 4.17 (H) 01/12/2019 0908   CALCIUM 8.7 (L) 01/12/2019 0908   GFRNONAA 10 (L) 01/12/2019 0908   GFRAA 12 (L) 01/12/2019 0908    Lipid Panel     Component Value Date/Time   CHOL 134 06/02/2015 0000   TRIG 109 06/02/2015 0000   HDL 43 06/02/2015 0000   LDLCALC 69 06/02/2015 0000    CBC    Component Value Date/Time   WBC 11.1 (H) 01/12/2019 0908   RBC 3.82 (L) 01/12/2019 0908   HGB 9.7 (L) 01/12/2019 0908   HCT 30.6 (L) 01/12/2019 0908   PLT 81 (L) 01/12/2019 0908   MCV 80.1 01/12/2019 0908   MCH 25.4 (L) 01/12/2019 0908   MCHC 31.7 01/12/2019 0908   RDW 17.9 (H) 01/12/2019 0908   LYMPHSABS 7.4 (H) 01/12/2019 0908   MONOABS 0.4 01/12/2019 0908   EOSABS 0.3 01/12/2019 0908   BASOSABS 0.1 01/12/2019 0908    Hgb A1C Lab Results  Component Value Date   HGBA1C 7.6 08/09/2016             Assessment & Plan:   Anal Warts:  Advised her she needs to contact her surgeon and have her appt moved up She should keep her appt with derm in case she can not get back in with the surgeon She wonder about referral to GI, but I do not think they would treat her for this Advised her topical and oral antibiotics will not help this, they have to be frozen, or surgically removed  Return precautions discussed Webb Silversmith, NP This visit occurred during the SARS-CoV-2 public health emergency.  Safety protocols were in place, including screening questions prior to the visit, additional usage of staff PPE, and extensive cleaning of exam room while observing appropriate contact time as indicated for disinfecting solutions.

## 2020-06-16 NOTE — Patient Instructions (Signed)
Genital Warts Genital warts are small growths in the area around the genitals or the anus. They are caused by a type of germ (HPV virus). This germ is spread from person to person during sex. It can be spread through vaginal, anal, and oral sex. Genital warts can lead to other problems if they are not treated. A person is more likely to have this condition if he or she:  Has sex without using a condom.  Has sex with many people.  Has sex before the age of 16.  Has a weak body defense (immune) system. This condition can be treated with medicines. Your doctor may also burn or freeze the warts. In some cases, surgery may be done to remove the warts. Follow these instructions at home: Medicines   Apply over-the-counter and prescription medicines only as told by your doctor.  Do not use medicines that are meant for treating hand warts.  Talk with your doctor about using creams to treat itching. Instructions for women  Plan to have regular tests to check for cervical cancer. Your risk for this cancer increases when you have genital warts.  If you become pregnant, tell your doctor that you have had genital warts. The germ can be passed to the baby. General instructions  Do not touch or scratch the warts.  Do not have sex until your treatment is done.  Tell your current and past sexual partners about your condition. They may need treatment.  After treatment, use condoms during sex.  Keep all follow-up visits as told by your doctor. This is important. How is this prevented? Talk with your doctor about getting the HPV shot. The HPV shot:  Can help stop some HPV infections and cancers.  Is given to males and females who are 11-26 years old.  Will not work if you already have HPV.  Is not recommended for pregnant women. Contact a doctor if:  You have redness, swelling, or pain in the area of the treated skin.  You have a fever.  You feel sick.  You feel lumps in the area  around your genitals or anus.  You have bleeding in the area around your genitals or anus.  You have pain during sex. Summary  Genital warts are small growths in the areas around the genitals or the anus. They are caused by a type of germ (HPV virus).  The germ is spread by having vaginal, anal, or oral sex without using a condom.  This condition is treated using medicines. In some cases, freezing, burning, or surgery may be done to get rid of the warts.  This condition may be prevented by getting a HPV shot. This information is not intended to replace advice given to you by your health care provider. Make sure you discuss any questions you have with your health care provider. Document Revised: 11/12/2017 Document Reviewed: 11/12/2017 Elsevier Patient Education  2020 Elsevier Inc.  

## 2020-06-17 ENCOUNTER — Telehealth: Payer: Self-pay | Admitting: Internal Medicine

## 2020-06-17 DIAGNOSIS — Z992 Dependence on renal dialysis: Secondary | ICD-10-CM | POA: Diagnosis not present

## 2020-06-17 DIAGNOSIS — N186 End stage renal disease: Secondary | ICD-10-CM | POA: Diagnosis not present

## 2020-06-17 NOTE — Telephone Encounter (Signed)
Home Health Verbal Orders - Caller/Agency: Amy/ [facility]  Callback Number: [phone] Requesting OT/PT/Skilled Nursing/Social Work/Speech Therapy: Skilled Nursing with Medication Management Frequency: 1x 9 weeks.  Referral Request - Has patient seen PCP for this complaint? Yes.   *If NO, is insurance requiring patient see PCP for this issue before PCP can refer them? Referral for which specialty: [facility] Preferred provider/office: see above  Reason for referral: anal warts getting worse.

## 2020-06-18 DIAGNOSIS — Z992 Dependence on renal dialysis: Secondary | ICD-10-CM | POA: Diagnosis not present

## 2020-06-18 DIAGNOSIS — N186 End stage renal disease: Secondary | ICD-10-CM | POA: Diagnosis not present

## 2020-06-19 DIAGNOSIS — N186 End stage renal disease: Secondary | ICD-10-CM | POA: Diagnosis not present

## 2020-06-19 DIAGNOSIS — Z992 Dependence on renal dialysis: Secondary | ICD-10-CM | POA: Diagnosis not present

## 2020-06-20 DIAGNOSIS — Z992 Dependence on renal dialysis: Secondary | ICD-10-CM | POA: Diagnosis not present

## 2020-06-20 DIAGNOSIS — N186 End stage renal disease: Secondary | ICD-10-CM | POA: Diagnosis not present

## 2020-06-20 NOTE — Telephone Encounter (Signed)
This has been managed by the [place] in [place]. They need to call them for orders.

## 2020-06-21 DIAGNOSIS — N186 End stage renal disease: Secondary | ICD-10-CM | POA: Diagnosis not present

## 2020-06-21 DIAGNOSIS — Z992 Dependence on renal dialysis: Secondary | ICD-10-CM | POA: Diagnosis not present

## 2020-06-22 DIAGNOSIS — Z992 Dependence on renal dialysis: Secondary | ICD-10-CM | POA: Diagnosis not present

## 2020-06-22 DIAGNOSIS — N186 End stage renal disease: Secondary | ICD-10-CM | POA: Diagnosis not present

## 2020-06-23 DIAGNOSIS — Z992 Dependence on renal dialysis: Secondary | ICD-10-CM | POA: Diagnosis not present

## 2020-06-23 DIAGNOSIS — N186 End stage renal disease: Secondary | ICD-10-CM | POA: Diagnosis not present

## 2020-06-24 DIAGNOSIS — N186 End stage renal disease: Secondary | ICD-10-CM | POA: Diagnosis not present

## 2020-06-24 DIAGNOSIS — Z992 Dependence on renal dialysis: Secondary | ICD-10-CM | POA: Diagnosis not present

## 2020-06-25 DIAGNOSIS — N186 End stage renal disease: Secondary | ICD-10-CM | POA: Diagnosis not present

## 2020-06-25 DIAGNOSIS — Z992 Dependence on renal dialysis: Secondary | ICD-10-CM | POA: Diagnosis not present

## 2020-06-26 DIAGNOSIS — N186 End stage renal disease: Secondary | ICD-10-CM | POA: Diagnosis not present

## 2020-06-26 DIAGNOSIS — Z992 Dependence on renal dialysis: Secondary | ICD-10-CM | POA: Diagnosis not present

## 2020-06-27 DIAGNOSIS — Z992 Dependence on renal dialysis: Secondary | ICD-10-CM | POA: Diagnosis not present

## 2020-06-27 DIAGNOSIS — N186 End stage renal disease: Secondary | ICD-10-CM | POA: Diagnosis not present

## 2020-06-28 DIAGNOSIS — N186 End stage renal disease: Secondary | ICD-10-CM | POA: Diagnosis not present

## 2020-06-28 DIAGNOSIS — Z992 Dependence on renal dialysis: Secondary | ICD-10-CM | POA: Diagnosis not present

## 2020-06-28 DIAGNOSIS — Z0100 Encounter for examination of eyes and vision without abnormal findings: Secondary | ICD-10-CM | POA: Diagnosis not present

## 2020-06-28 LAB — HM DIABETES EYE EXAM

## 2020-06-29 DIAGNOSIS — N186 End stage renal disease: Secondary | ICD-10-CM | POA: Diagnosis not present

## 2020-06-29 DIAGNOSIS — Z992 Dependence on renal dialysis: Secondary | ICD-10-CM | POA: Diagnosis not present

## 2020-06-30 DIAGNOSIS — Z992 Dependence on renal dialysis: Secondary | ICD-10-CM | POA: Diagnosis not present

## 2020-06-30 DIAGNOSIS — N186 End stage renal disease: Secondary | ICD-10-CM | POA: Diagnosis not present

## 2020-07-01 DIAGNOSIS — Z992 Dependence on renal dialysis: Secondary | ICD-10-CM | POA: Diagnosis not present

## 2020-07-01 DIAGNOSIS — N186 End stage renal disease: Secondary | ICD-10-CM | POA: Diagnosis not present

## 2020-07-02 DIAGNOSIS — N186 End stage renal disease: Secondary | ICD-10-CM | POA: Diagnosis not present

## 2020-07-02 DIAGNOSIS — Z992 Dependence on renal dialysis: Secondary | ICD-10-CM | POA: Diagnosis not present

## 2020-07-03 DIAGNOSIS — N186 End stage renal disease: Secondary | ICD-10-CM | POA: Diagnosis not present

## 2020-07-03 DIAGNOSIS — Z992 Dependence on renal dialysis: Secondary | ICD-10-CM | POA: Diagnosis not present

## 2020-07-04 DIAGNOSIS — N186 End stage renal disease: Secondary | ICD-10-CM | POA: Diagnosis not present

## 2020-07-04 DIAGNOSIS — Z992 Dependence on renal dialysis: Secondary | ICD-10-CM | POA: Diagnosis not present

## 2020-07-05 DIAGNOSIS — N186 End stage renal disease: Secondary | ICD-10-CM | POA: Diagnosis not present

## 2020-07-05 DIAGNOSIS — Z992 Dependence on renal dialysis: Secondary | ICD-10-CM | POA: Diagnosis not present

## 2020-07-06 DIAGNOSIS — Z992 Dependence on renal dialysis: Secondary | ICD-10-CM | POA: Diagnosis not present

## 2020-07-06 DIAGNOSIS — N186 End stage renal disease: Secondary | ICD-10-CM | POA: Diagnosis not present

## 2020-07-07 DIAGNOSIS — Z992 Dependence on renal dialysis: Secondary | ICD-10-CM | POA: Diagnosis not present

## 2020-07-07 DIAGNOSIS — N186 End stage renal disease: Secondary | ICD-10-CM | POA: Diagnosis not present

## 2020-07-08 DIAGNOSIS — Z992 Dependence on renal dialysis: Secondary | ICD-10-CM | POA: Diagnosis not present

## 2020-07-08 DIAGNOSIS — N186 End stage renal disease: Secondary | ICD-10-CM | POA: Diagnosis not present

## 2020-07-09 DIAGNOSIS — Z992 Dependence on renal dialysis: Secondary | ICD-10-CM | POA: Diagnosis not present

## 2020-07-09 DIAGNOSIS — N186 End stage renal disease: Secondary | ICD-10-CM | POA: Diagnosis not present

## 2020-07-10 DIAGNOSIS — Z992 Dependence on renal dialysis: Secondary | ICD-10-CM | POA: Diagnosis not present

## 2020-07-10 DIAGNOSIS — N186 End stage renal disease: Secondary | ICD-10-CM | POA: Diagnosis not present

## 2020-07-11 ENCOUNTER — Telehealth: Payer: Self-pay | Admitting: Internal Medicine

## 2020-07-11 DIAGNOSIS — Z992 Dependence on renal dialysis: Secondary | ICD-10-CM | POA: Diagnosis not present

## 2020-07-11 DIAGNOSIS — N186 End stage renal disease: Secondary | ICD-10-CM | POA: Diagnosis not present

## 2020-07-11 NOTE — Telephone Encounter (Signed)
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 Amy is going to notify Doctor at [facility] tomorrow that sugars are still running high; today FBS was 245 and pt did not eat until 11 AM. Amy request cb on 07/12/20. Will send to [facility] since Avie Echevaria NP has already sent note to [facility]

## 2020-07-11 NOTE — Telephone Encounter (Signed)
I spoke with pt; pt had surgery on 04/08/20 for veneral warts or condyloma; pt was taking med in June not sure name of med for warts; pt's husband said the warts never went away and has gotten 1000 times worse. Was seen last week and stayed several hours in ED and pt was not given any meds and an appt on 07/07/20 with previous surgeon and that doctor did not know what else to do. Has not contacted that doctor back. Not sleeping at night. Pt saw R Baity NP on 06/16/20 about anal warts and pts husband wants to know if can get a referral to a specialist. Pt does not want to go to UC or ED which is a waist of time; they have been there and nothing done.requst cb after review with Avie Echevaria NP.

## 2020-07-11 NOTE — Telephone Encounter (Signed)
[facility] - Client TELEPHONE ADVICE RECORD [facility] Patient Name: Christine Gibson Gender: Female DOB: 1951-10-19 Age: 69 Y 11 D Return Phone Number: [phone] (Primary), [phone] (Secondary) Address[place] [address] Client [facility] - Client Client Site [facility] - Day Physician Webb Silversmith - NP Contact Type Call Who Is Calling Patient / Member / Family / Caregiver Call Type Triage / Clinical Caller Name Christine Gibson Relationship To Patient Spouse Return Phone Number [phone] (Primary) Chief Complaint Rectal Bleeding Reason for Call Symptomatic / Request for [place] states wife has a sore on rectum the size of a hand, red, raw, itchy, and painful. She wont eat because she doesn't want to go to restroom. It is getting bigger. It is bleeding. Translation No Nurse Assessment Nurse: Christine Norman, RN, Christine Gibson Date/Time (Eastern Time): 07/11/2020 10:23:32 AM Confirm and document reason for call. If symptomatic, describe symptoms. ---Caller states wife has a sore on rectum the size of a hand, red, raw, itchy, and painful. She wont eat because she doesn't want to go to restroom. It is getting bigger. It is bleeding. States she had surgery on 6/18 for it and called it a condyloma. Told that there is nothing else they can do, and the surgeon told them they want to do some sort of treatments but that's been 2 weeks ago. Has the patient had close contact with a person known or suspected to have the novel coronavirus illness OR traveled / lives in area with major community spread (including international travel) in the last 14 days from the onset of symptoms? * If Asymptomatic, screen for exposure and travel within the last 14 days. ---No Does the patient have any new or worsening symptoms? ---Yes Will a triage be completed? ---Yes Related  visit to physician within the last 2 weeks? ---Yes Does the PT have any chronic conditions? (i.e. diabetes, asthma, this includes High risk factors for pregnancy, etc.) ---Yes List chronic conditions. 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 ---No PLEASE NOTE: All timestamps contained within this report are represented as [place] Standard Time. CONFIDENTIALTY NOTICE: This fax transmission is intended only for the addressee. It contains information that is legally privileged, confidential or otherwise protected from use or disclosure. If you are not the intended recipient, you are strictly prohibited from reviewing, disclosing, copying using or disseminating any of this information or taking any action in reliance on or regarding this information. If you have received this fax in error, please notify us immediately by telephone so that we can arrange for its return to [place]. Phone: [phone], Toll-Free: [phone], Fax: [phone] Page: 2 of 2 Call Id: [id number] Guidelines Guideline Title Affirmed Question Affirmed Notes Nurse Date/Time Christine Gibson Time) Rectal Symptoms Patient sounds very sick or weak to the triager Christine Norman, RN, Christine Gibson 07/11/2020 10:29:22 AM Disp. Time Christine Gibson Time) Disposition Final User 07/11/2020 10:33:07 AM Go to ED Now (or PCP triage) Yes Christine Norman, RN, Christine Gibson Caller Disagree/Comply Disagree Caller Understands Yes PreDisposition InappropriateToAsk Care Advice Given Per Guideline * IF NO PCP (PRIMARY CARE PROVIDER) SECOND-LEVEL TRIAGE: You need to be seen within the next hour. Go to the [place] at _____________ [place] as soon as you can. Comments User: Christine Forehand, RN Date/Time Christine Gibson Time): 07/11/2020 10:40:27 AM RN spoke with backline office staff and informed of ED/UC outcome and refusal and request for a referral to a specialist.  States they will call Mr. Granberry  directly, phone number provided. User: Christine Forehand, RN Date/Time (Eastern Time): 07/11/2020 10:41:29 AM patient seems sick, is not wanting to eat, and due to the size of the condyloma, RN concerned it needs to be addressed urgently. Referrals GO TO FACILITY REFUSED

## 2020-07-11 NOTE — Telephone Encounter (Signed)
I need for them to attach a picture of her buttocks/rectum and upload it to mychart. If they can not do this, I need them to come back for follow up so I can take pictures to attach to the chart and refer her to derm/general surgery.

## 2020-07-11 NOTE — Telephone Encounter (Signed)
Spouse [name] wanting to get a referral For pt   He stated she has a sore on her rectum he stated it is the size of his hand.  Red/raw/itchy painful No fever.  I sent pt over to access nurse to be traige they call back stated pt out come was er or urgent care.  Pt refused stating they only want a referral

## 2020-07-12 DIAGNOSIS — Z992 Dependence on renal dialysis: Secondary | ICD-10-CM | POA: Diagnosis not present

## 2020-07-12 DIAGNOSIS — N186 End stage renal disease: Secondary | ICD-10-CM | POA: Diagnosis not present

## 2020-07-13 DIAGNOSIS — N186 End stage renal disease: Secondary | ICD-10-CM | POA: Diagnosis not present

## 2020-07-13 DIAGNOSIS — Z992 Dependence on renal dialysis: Secondary | ICD-10-CM | POA: Diagnosis not present

## 2020-07-14 DIAGNOSIS — N186 End stage renal disease: Secondary | ICD-10-CM | POA: Diagnosis not present

## 2020-07-14 DIAGNOSIS — Z992 Dependence on renal dialysis: Secondary | ICD-10-CM | POA: Diagnosis not present

## 2020-07-14 NOTE — Telephone Encounter (Signed)
Christine Fenton, NP     I need for them to attach a picture of her buttocks/rectum and upload it to [employer]. If they can not do this, I need them to come back for follow up so I can take pictures to attach to the chart and refer her to derm/general surgery.     Left message on voicemail

## 2020-07-15 DIAGNOSIS — N186 End stage renal disease: Secondary | ICD-10-CM | POA: Diagnosis not present

## 2020-07-15 DIAGNOSIS — Z992 Dependence on renal dialysis: Secondary | ICD-10-CM | POA: Diagnosis not present

## 2020-07-18 DIAGNOSIS — Z992 Dependence on renal dialysis: Secondary | ICD-10-CM | POA: Diagnosis not present

## 2020-07-18 DIAGNOSIS — N186 End stage renal disease: Secondary | ICD-10-CM | POA: Diagnosis not present

## 2020-07-21 DIAGNOSIS — N186 End stage renal disease: Secondary | ICD-10-CM | POA: Diagnosis not present

## 2020-07-21 DIAGNOSIS — Z992 Dependence on renal dialysis: Secondary | ICD-10-CM | POA: Diagnosis not present

## 2020-07-22 DIAGNOSIS — N186 End stage renal disease: Secondary | ICD-10-CM | POA: Diagnosis not present

## 2020-07-26 DIAGNOSIS — Z992 Dependence on renal dialysis: Secondary | ICD-10-CM | POA: Diagnosis not present

## 2020-07-26 DIAGNOSIS — N186 End stage renal disease: Secondary | ICD-10-CM | POA: Diagnosis not present

## 2020-07-27 DIAGNOSIS — Z992 Dependence on renal dialysis: Secondary | ICD-10-CM | POA: Diagnosis not present

## 2020-07-27 DIAGNOSIS — N186 End stage renal disease: Secondary | ICD-10-CM | POA: Diagnosis not present

## 2020-07-28 DIAGNOSIS — Z992 Dependence on renal dialysis: Secondary | ICD-10-CM | POA: Diagnosis not present

## 2020-07-28 DIAGNOSIS — N186 End stage renal disease: Secondary | ICD-10-CM | POA: Diagnosis not present

## 2020-07-29 DIAGNOSIS — N186 End stage renal disease: Secondary | ICD-10-CM | POA: Diagnosis not present

## 2020-07-29 DIAGNOSIS — Z992 Dependence on renal dialysis: Secondary | ICD-10-CM | POA: Diagnosis not present

## 2020-07-30 DIAGNOSIS — N186 End stage renal disease: Secondary | ICD-10-CM | POA: Diagnosis not present

## 2020-07-30 DIAGNOSIS — Z992 Dependence on renal dialysis: Secondary | ICD-10-CM | POA: Diagnosis not present

## 2020-07-31 DIAGNOSIS — Z992 Dependence on renal dialysis: Secondary | ICD-10-CM | POA: Diagnosis not present

## 2020-07-31 DIAGNOSIS — N186 End stage renal disease: Secondary | ICD-10-CM | POA: Diagnosis not present

## 2020-08-01 DIAGNOSIS — N186 End stage renal disease: Secondary | ICD-10-CM | POA: Diagnosis not present

## 2020-08-01 DIAGNOSIS — Z992 Dependence on renal dialysis: Secondary | ICD-10-CM | POA: Diagnosis not present

## 2020-08-02 DIAGNOSIS — N186 End stage renal disease: Secondary | ICD-10-CM | POA: Diagnosis not present

## 2020-08-02 DIAGNOSIS — Z992 Dependence on renal dialysis: Secondary | ICD-10-CM | POA: Diagnosis not present

## 2020-08-03 DIAGNOSIS — N186 End stage renal disease: Secondary | ICD-10-CM | POA: Diagnosis not present

## 2020-08-03 DIAGNOSIS — Z992 Dependence on renal dialysis: Secondary | ICD-10-CM | POA: Diagnosis not present

## 2020-08-04 ENCOUNTER — Telehealth: Payer: Self-pay

## 2020-08-04 NOTE — Telephone Encounter (Signed)
Yes that is okay  

## 2020-08-04 NOTE — Telephone Encounter (Signed)
Langley Gauss with [facility] left v/m that palliative services received a recommendation for pt to have palliative care and [facility] request confirmation with Avie Echevaria NP that that is OK with her.Denise request cb.

## 2020-08-05 NOTE — Telephone Encounter (Signed)
VO given as instructed  

## 2020-08-10 ENCOUNTER — Telehealth: Payer: Self-pay | Admitting: Hospice

## 2020-08-10 NOTE — Telephone Encounter (Signed)
Called patient to offer to schedule the Palliative Consult, no answer - left message with reason for call along with my name and contact number.  I then called husband's cell and spoke with him regarding Palliative referral/services.  Husband stated that patient is currently in the [facility].  I told him that I would notify our Hospital Liaison Team that she was in the hospital and that I would call him back once she has been discharged.  He was in agreement with this.

## 2020-08-15 ENCOUNTER — Telehealth: Payer: Self-pay | Admitting: Hospice

## 2020-08-15 NOTE — Telephone Encounter (Signed)
Rec'd call back from husband and he said that the patient came home from the hospital and they were just leaving the [place] from a MD appointment and he has to go pick up some meds for her and then they were going home.  Asked him if I could call him back tomorrow afternoon to schedule the Palliative visit and he was in agreement with this.

## 2020-08-15 NOTE — Telephone Encounter (Signed)
Called patient's husband, [name], to see if patient had been discharged from the [facility], no answer - left message requesting a return call.

## 2020-08-16 ENCOUNTER — Telehealth: Payer: Self-pay | Admitting: Hospice

## 2020-08-16 NOTE — Telephone Encounter (Signed)
Spoke with patient's husband, [name] and discussed Palliative services with him and all questions were answered and he was in agreement with scheduling visit with NP.  I have scheduled an In-person Consult for 08/24/20 @ 1 PM

## 2020-08-18 DIAGNOSIS — N186 End stage renal disease: Secondary | ICD-10-CM | POA: Diagnosis not present

## 2020-08-18 DIAGNOSIS — Z992 Dependence on renal dialysis: Secondary | ICD-10-CM | POA: Diagnosis not present

## 2020-08-19 DIAGNOSIS — Z992 Dependence on renal dialysis: Secondary | ICD-10-CM | POA: Diagnosis not present

## 2020-08-19 DIAGNOSIS — N186 End stage renal disease: Secondary | ICD-10-CM | POA: Diagnosis not present

## 2020-08-20 DIAGNOSIS — Z992 Dependence on renal dialysis: Secondary | ICD-10-CM | POA: Diagnosis not present

## 2020-08-20 DIAGNOSIS — N186 End stage renal disease: Secondary | ICD-10-CM | POA: Diagnosis not present

## 2020-08-21 DIAGNOSIS — N186 End stage renal disease: Secondary | ICD-10-CM | POA: Diagnosis not present

## 2020-08-21 DIAGNOSIS — Z992 Dependence on renal dialysis: Secondary | ICD-10-CM | POA: Diagnosis not present

## 2020-08-22 DIAGNOSIS — N186 End stage renal disease: Secondary | ICD-10-CM | POA: Diagnosis not present

## 2020-08-22 DIAGNOSIS — Z992 Dependence on renal dialysis: Secondary | ICD-10-CM | POA: Diagnosis not present

## 2020-08-23 DIAGNOSIS — Z992 Dependence on renal dialysis: Secondary | ICD-10-CM | POA: Diagnosis not present

## 2020-08-23 DIAGNOSIS — N186 End stage renal disease: Secondary | ICD-10-CM | POA: Diagnosis not present

## 2020-08-24 ENCOUNTER — Other Ambulatory Visit: Payer: Medicare PPO | Admitting: Hospice

## 2020-08-24 ENCOUNTER — Other Ambulatory Visit: Payer: Self-pay

## 2020-08-24 DIAGNOSIS — Z515 Encounter for palliative care: Secondary | ICD-10-CM

## 2020-08-24 DIAGNOSIS — C9110 Chronic lymphocytic leukemia of B-cell type not having achieved remission: Secondary | ICD-10-CM | POA: Diagnosis not present

## 2020-08-24 DIAGNOSIS — A630 Anogenital (venereal) warts: Secondary | ICD-10-CM | POA: Diagnosis not present

## 2020-08-24 DIAGNOSIS — Z8673 Personal history of transient ischemic attack (TIA), and cerebral infarction without residual deficits: Secondary | ICD-10-CM | POA: Diagnosis not present

## 2020-08-24 DIAGNOSIS — D696 Thrombocytopenia, unspecified: Secondary | ICD-10-CM | POA: Diagnosis not present

## 2020-08-24 DIAGNOSIS — Z992 Dependence on renal dialysis: Secondary | ICD-10-CM | POA: Diagnosis not present

## 2020-08-24 DIAGNOSIS — N186 End stage renal disease: Secondary | ICD-10-CM | POA: Diagnosis not present

## 2020-08-24 NOTE — Progress Notes (Signed)
[facility] Consult Note Telephone: [phone]  Fax: [phone]  PATIENT NAME: Christine Gibson DOB: 1951/04/20 MRN: [id number]  PRIMARY CARE PROVIDER:   Jearld Fenton, NP Jearld Fenton, NP [address] [place],  [place] [address]  REFERRING PROVIDER: Jearld Fenton, NP Jearld Fenton, NP [address] [place],   [address]  RESPONSIBLE PARTY: Self Phone: [phone] Emergency contact: Patient's Spouse Yobana Culliton [phone]    RECOMMENDATIONS/PLAN:   Visit consisted of Webb Silversmith, NP building trust and discussions on Palliative Medicine as specialized medical care for people living with serious illness, aimed at facilitating better quality of life through symptoms relief, assisting with advance care plan and establishing goals of care.  Affiliation to local church [employer] [place] is an Physicist, medical for patient/spouse.  Advance Care Planning/Code Status: Implications and ramifications of CODE STATUS was discussed today.  Patient was undecided but finally settled for full CODE STATUS for now.  She is open to further discussions and subsequent visits.  Goals of Care: Goals of care include to maximize quality of life and symptom management.  Follow up: Palliative care will continue to follow patient for goals of care clarification and symptom management.  Follow-up in a month  Symptom management:  Fatigue related to chemo every 3 weeks at [facility]. PT/OT/ST is in process.  Nursing aid comes out twice a week to help with bathing.   Patient on peritoneal dialysis at home every night - since one year. No adverse reactions. Patient has been consistent and gets supplies from [place].   Pain in groin area managed with Oxycodone; effective. [facility] nurse comes every week for sacral wound.  Palliative will continue to monitor for symptom management/decline and make recommendations  as needed.  Family /Caregiver/Community Supports: Patient lives at home with her spouse who coordinates her care and is well involved.  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 I spent 1 hour and 25   minutes providing this consultation; time includes time spent with patient/family, chart review, provider coordination,  and documentation. More than 50% of the time in this consultation was spent on coordinating communication  HISTORY OF PRESENT ILLNESS:  Christine Gibson is a 69 y.o. year old female with multiple medical problems including ESRD;, Leukemia. Palliative Care was asked to help address goals of care.   CODE STATUS: Full  PPS: 40%  HOSPICE ELIGIBILITY/DIAGNOSIS: TBD  PAST MEDICAL HISTORY:  Past Medical History:  Diagnosis Date  . Chronic kidney disease   . Diabetes mellitus without complication ([place])   . History of leukemia   . Hypertension     SOCIAL HX:  Social History   Tobacco Use  . Smoking status: Never Smoker  . Smokeless tobacco: Never Used  Substance Use Topics  . Alcohol use: Yes    Alcohol/week: 0.0 standard drinks    Comment: occassional    ALLERGIES:  Allergies  Allergen Reactions  . Lisinopril Cough     PERTINENT MEDICATIONS:  Outpatient Encounter Medications as of 08/24/2020  Medication Sig  . amLODipine (NORVASC) 10 MG tablet Take 1 tablet (10 mg total) by mouth daily.  Marland Kitchen apixaban (ELIQUIS) 5 MG TABS tablet Take 5 mg by mouth in the morning, at noon, in the evening, and at bedtime.  Marland Kitchen atorvastatin (LIPITOR) 10 MG tablet Take 5 mg by mouth daily.  . calcitRIOL (ROCALTROL)  0.25 MCG capsule Take 0.25 mcg by mouth See admin instructions. Tale on Thursday and Saturday  . carvedilol (COREG) 25 MG tablet Take 25 mg by mouth in the morning and at bedtime.  . CHLORAMBUCIL PO Take by mouth.  . Cyanocobalamin (VITAMIN B-12) 5000 MCG TBDP Take 5,000 mcg by mouth daily.  Marland Kitchen CYPROHEPTADINE HCL PO Take 410 mg by mouth in the morning  and at bedtime.  . ferrous sulfate 325 (65 FE) MG EC tablet Take 325 mg by mouth every Monday, Wednesday, and Friday.  . furosemide (LASIX) 40 MG tablet Take 40 mg by mouth 2 (two) times daily.   Marland Kitchen glipiZIDE (GLUCOTROL) 5 MG tablet Take 5 mg by mouth daily before breakfast.  . hydrALAZINE (APRESOLINE) 25 MG tablet Take 25 mg by mouth 3 (three) times daily.   Marland Kitchen ibrutinib (IMBRUVICA) 140 MG capsul Take 140 mg by mouth daily.  Marland Kitchen losartan (COZAAR) 50 MG tablet Take 50 mg by mouth daily.   . meclizine (ANTIVERT) 25 MG tablet Take 1 tablet (25 mg total) by mouth 3 (three) times daily as needed for dizziness.  . Multiple Vitamins-Minerals (EYE SUPPORT) TABS Take 1 tablet by mouth daily. Occuvite  . NIFEdipine (ADALAT CC) 60 MG 24 hr tablet Take 60 mg by mouth daily.  . potassium chloride (KLOR-CON) 20 MEQ packet Take 20 mEq by mouth daily.  . Turmeric POWD by Does not apply route. Cook with it  . valACYclovir (VALTREX) 500 MG tablet Take 1 tablet (500 mg total) by mouth 2 (two) times daily. (Patient taking differently: Take 400 mg by mouth 2 (two) times daily. )   No facility-administered encounter medications on file as of 08/24/2020.    PHYSICAL EXAM/ROS:  General: In no acute distress; cooperative Cardiovascular: regular rate and rhythm Pulmonary: clear ant/post fields, normal respiratory effort Abdomen: soft, nontender, + bowel sounds; denies constipation GU: no suprapubic tenderness Extremities: no edema.  Skin: no rashes to exposed skin. Reports sacral wound managed by [facility] wound nurse Neurological: Weakness but otherwise nonfocal  Teodoro Spray, NP

## 2020-08-25 DIAGNOSIS — Z992 Dependence on renal dialysis: Secondary | ICD-10-CM | POA: Diagnosis not present

## 2020-08-25 DIAGNOSIS — N186 End stage renal disease: Secondary | ICD-10-CM | POA: Diagnosis not present

## 2020-08-26 DIAGNOSIS — N186 End stage renal disease: Secondary | ICD-10-CM | POA: Diagnosis not present

## 2020-08-26 DIAGNOSIS — Z992 Dependence on renal dialysis: Secondary | ICD-10-CM | POA: Diagnosis not present

## 2020-08-27 DIAGNOSIS — Z992 Dependence on renal dialysis: Secondary | ICD-10-CM | POA: Diagnosis not present

## 2020-08-27 DIAGNOSIS — N186 End stage renal disease: Secondary | ICD-10-CM | POA: Diagnosis not present

## 2020-08-28 DIAGNOSIS — Z992 Dependence on renal dialysis: Secondary | ICD-10-CM | POA: Diagnosis not present

## 2020-08-28 DIAGNOSIS — N186 End stage renal disease: Secondary | ICD-10-CM | POA: Diagnosis not present

## 2020-08-29 DIAGNOSIS — N186 End stage renal disease: Secondary | ICD-10-CM | POA: Diagnosis not present

## 2020-08-29 DIAGNOSIS — Z992 Dependence on renal dialysis: Secondary | ICD-10-CM | POA: Diagnosis not present

## 2020-08-30 DIAGNOSIS — Z992 Dependence on renal dialysis: Secondary | ICD-10-CM | POA: Diagnosis not present

## 2020-08-30 DIAGNOSIS — N186 End stage renal disease: Secondary | ICD-10-CM | POA: Diagnosis not present

## 2020-08-31 DIAGNOSIS — Z992 Dependence on renal dialysis: Secondary | ICD-10-CM | POA: Diagnosis not present

## 2020-08-31 DIAGNOSIS — N186 End stage renal disease: Secondary | ICD-10-CM | POA: Diagnosis not present

## 2020-09-01 ENCOUNTER — Other Ambulatory Visit: Payer: Medicare PPO | Admitting: Hospice

## 2020-09-01 ENCOUNTER — Other Ambulatory Visit: Payer: Self-pay

## 2020-09-01 DIAGNOSIS — Z515 Encounter for palliative care: Secondary | ICD-10-CM | POA: Diagnosis not present

## 2020-09-01 DIAGNOSIS — C9110 Chronic lymphocytic leukemia of B-cell type not having achieved remission: Secondary | ICD-10-CM

## 2020-09-01 DIAGNOSIS — N186 End stage renal disease: Secondary | ICD-10-CM | POA: Diagnosis not present

## 2020-09-01 DIAGNOSIS — Z992 Dependence on renal dialysis: Secondary | ICD-10-CM | POA: Diagnosis not present

## 2020-09-01 NOTE — Progress Notes (Signed)
[employer] Consult Note Telephone: [phone]  Fax: [phone]  PATIENT NAME: Christine Gibson DOB: Nov 30, 1950 MRN: [id number]  PRIMARY CARE PROVIDER:   Jearld Fenton, NP Christine Fenton, NP [address] [place],  [place] [address]  REFERRING PROVIDER: Jearld Fenton, NP Christine Fenton, NP [address] [place],  [place] [address] RESPONSIBLE PARTY: Self Phone: [phone] Emergency contact: Patient's Spouse Christine Gibson [phone]  TELEHEALTH VISIT STATEMENT Due to the COVID-19 crisis, this visit was done via telephone from my office. It was initiated and consented to by this patient and/or family.   RECOMMENDATIONS/PLAN:  Telehealth visit after report from [facility] that patient has intermittent confusion and decline in her functional status.  She reported that patient's spouse said that her nephrologist had asked if they wanted to stop peritoneal dialysis.  Advance Care Planning/Code Status:  Ongoing decline necessitated further discussion on advance care planning as previously patient had affirmed that she is a full code.  After discussions today, patient remains a full code.  Spouse said patient will continue with peritoneal dialysis at this time. They are open to further discussions on goals of care in the future; not open to hospice discussion at this time.  Goals of Care: Goals of care include to maximize quality of life and symptom management.  Follow up: Palliative care will continue to follow patient for goals of care clarification and symptom management.  Follow-up in a month/as needed  Symptom management:  Intermittent confusion: Amy and spouse reports that patient sometimes does not recognize her husband.  She answered questions coherently during visit; spouse affirmed her confusion was over at this time.  Spouse is in communication with PCP at the [place].  Discussion on need  for UA and lab work to determine cause of the confusion that comes and goes.  Spouse reported that patient has appointment today at the dialysis center [facility] for lab work.  He said he was not able to collect urine for UA; likely in and out catheterizations for urine for UA at today's appointment.  Fatigue related to chemo every 3 weeks at [facility] for Leukemia. PT/OT/ST is in process.  Nursing aid comes out twice a week to help with bathing.  Nurse Amy also sees patient weekly for sacral wound. Patient/spouse to call with concerns.  Family /Caregiver/Community Supports:Patient lives at home with her spouse who coordinates her care and is well involved.  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 I spent  50 minutes providing this consultation; time includes time spent with patient/family, chart review, provider coordination,  and documentation. More than 50% of the time in this consultation was spent on coordinating communication  HISTORY OF PRESENT ILLNESS:  Christine Gibson is a 69 y.o. year old female with multiple medical problems including ESRD, Leukemia. Palliative Care was asked to help address goals of care.   CODE STATUS: Full  PPS: 40%   HOSPICE ELIGIBILITY/DIAGNOSIS: TBD  PAST MEDICAL HISTORY:  Past Medical History:  Diagnosis Date  . Chronic kidney disease   . Diabetes mellitus without complication ([place])   . History of leukemia   . Hypertension     SOCIAL HX:  Social History   Tobacco Use  . Smoking status: Never Smoker  . Smokeless tobacco: Never Used  Substance Use Topics  . Alcohol use: Yes    Alcohol/week: 0.0 standard  drinks    Comment: occassional    ALLERGIES:  Allergies  Allergen Reactions  . Lisinopril Cough     PERTINENT MEDICATIONS:  Outpatient Encounter Medications as of 09/01/2020  Medication Sig  . amLODipine (NORVASC) 10 MG tablet Take 1 tablet (10 mg total) by mouth daily.  Marland Kitchen apixaban (ELIQUIS) 5 MG TABS  tablet Take 5 mg by mouth in the morning, at noon, in the evening, and at bedtime.  Marland Kitchen atorvastatin (LIPITOR) 10 MG tablet Take 5 mg by mouth daily.  . calcitRIOL (ROCALTROL) 0.25 MCG capsule Take 0.25 mcg by mouth See admin instructions. Tale on Thursday and Saturday  . carvedilol (COREG) 25 MG tablet Take 25 mg by mouth in the morning and at bedtime.  . CHLORAMBUCIL PO Take by mouth.  . Cyanocobalamin (VITAMIN B-12) 5000 MCG TBDP Take 5,000 mcg by mouth daily.  Marland Kitchen CYPROHEPTADINE HCL PO Take 410 mg by mouth in the morning and at bedtime.  . ferrous sulfate 325 (65 FE) MG EC tablet Take 325 mg by mouth every Monday, Wednesday, and Friday.  . furosemide (LASIX) 40 MG tablet Take 40 mg by mouth 2 (two) times daily.   Marland Kitchen glipiZIDE (GLUCOTROL) 5 MG tablet Take 5 mg by mouth daily before breakfast.  . hydrALAZINE (APRESOLINE) 25 MG tablet Take 25 mg by mouth 3 (three) times daily.   Marland Kitchen ibrutinib (IMBRUVICA) 140 MG capsul Take 140 mg by mouth daily.  Marland Kitchen losartan (COZAAR) 50 MG tablet Take 50 mg by mouth daily.   . meclizine (ANTIVERT) 25 MG tablet Take 1 tablet (25 mg total) by mouth 3 (three) times daily as needed for dizziness.  . Multiple Vitamins-Minerals (EYE SUPPORT) TABS Take 1 tablet by mouth daily. Occuvite  . NIFEdipine (ADALAT CC) 60 MG 24 hr tablet Take 60 mg by mouth daily.  . potassium chloride (KLOR-CON) 20 MEQ packet Take 20 mEq by mouth daily.  . Turmeric POWD by Does not apply route. Cook with it  . valACYclovir (VALTREX) 500 MG tablet Take 1 tablet (500 mg total) by mouth 2 (two) times daily. (Patient taking differently: Take 400 mg by mouth 2 (two) times daily. )   No facility-administered encounter medications on file as of 09/01/2020.    Teodoro Spray, NP

## 2020-09-02 ENCOUNTER — Telehealth: Payer: Self-pay | Admitting: Hospice

## 2020-09-02 ENCOUNTER — Telehealth: Payer: Self-pay | Admitting: Internal Medicine

## 2020-09-02 DIAGNOSIS — Z992 Dependence on renal dialysis: Secondary | ICD-10-CM | POA: Diagnosis not present

## 2020-09-02 DIAGNOSIS — N186 End stage renal disease: Secondary | ICD-10-CM | POA: Diagnosis not present

## 2020-09-02 DIAGNOSIS — Z515 Encounter for palliative care: Secondary | ICD-10-CM

## 2020-09-02 NOTE — Telephone Encounter (Signed)
Diwandi - Case Manager with [facility] called to say patient's husband is overwhelmed with caring for patient; needs help with patient's activities of daily living. NP informed her that patient gets weekly RN visit, and nursing Aide twice a week for bathing/personal care. Diwandi said she has been working to increase the frequency of nursing aide though [place] PCP. NP encouraged her efforts also gave her Webb Silversmith NP's tel number to follow up for additional care. Diwandi was also assured that [facility] clinical navigator will also follow up accordingly in an effort to secure more help for patient.

## 2020-09-02 NOTE — Telephone Encounter (Signed)
Can you call and see what they need to speak with me about

## 2020-09-02 NOTE — Telephone Encounter (Signed)
Christine Gibson it was urgent that they spoke w/you in ref to patient today.

## 2020-09-03 DIAGNOSIS — N186 End stage renal disease: Secondary | ICD-10-CM | POA: Diagnosis not present

## 2020-09-03 DIAGNOSIS — Z992 Dependence on renal dialysis: Secondary | ICD-10-CM | POA: Diagnosis not present

## 2020-09-04 DIAGNOSIS — N186 End stage renal disease: Secondary | ICD-10-CM | POA: Diagnosis not present

## 2020-09-04 DIAGNOSIS — Z992 Dependence on renal dialysis: Secondary | ICD-10-CM | POA: Diagnosis not present

## 2020-09-05 DIAGNOSIS — N186 End stage renal disease: Secondary | ICD-10-CM | POA: Diagnosis not present

## 2020-09-05 DIAGNOSIS — Z992 Dependence on renal dialysis: Secondary | ICD-10-CM | POA: Diagnosis not present

## 2020-09-05 NOTE — Telephone Encounter (Signed)
Called and while on hold the line just hung up, will try again before 5pm tomorrow

## 2020-09-06 DIAGNOSIS — N186 End stage renal disease: Secondary | ICD-10-CM | POA: Diagnosis not present

## 2020-09-06 DIAGNOSIS — Z992 Dependence on renal dialysis: Secondary | ICD-10-CM | POA: Diagnosis not present

## 2020-09-07 DIAGNOSIS — Z992 Dependence on renal dialysis: Secondary | ICD-10-CM | POA: Diagnosis not present

## 2020-09-07 DIAGNOSIS — N186 End stage renal disease: Secondary | ICD-10-CM | POA: Diagnosis not present

## 2020-09-08 DIAGNOSIS — N186 End stage renal disease: Secondary | ICD-10-CM | POA: Diagnosis not present

## 2020-09-08 DIAGNOSIS — Z992 Dependence on renal dialysis: Secondary | ICD-10-CM | POA: Diagnosis not present

## 2020-09-09 DIAGNOSIS — Z992 Dependence on renal dialysis: Secondary | ICD-10-CM | POA: Diagnosis not present

## 2020-09-09 DIAGNOSIS — N186 End stage renal disease: Secondary | ICD-10-CM | POA: Diagnosis not present

## 2020-09-10 DIAGNOSIS — Z992 Dependence on renal dialysis: Secondary | ICD-10-CM | POA: Diagnosis not present

## 2020-09-10 DIAGNOSIS — N186 End stage renal disease: Secondary | ICD-10-CM | POA: Diagnosis not present

## 2020-09-11 DIAGNOSIS — N186 End stage renal disease: Secondary | ICD-10-CM | POA: Diagnosis not present

## 2020-09-11 DIAGNOSIS — Z992 Dependence on renal dialysis: Secondary | ICD-10-CM | POA: Diagnosis not present

## 2020-09-12 DIAGNOSIS — N186 End stage renal disease: Secondary | ICD-10-CM | POA: Diagnosis not present

## 2020-09-12 DIAGNOSIS — Z992 Dependence on renal dialysis: Secondary | ICD-10-CM | POA: Diagnosis not present

## 2020-09-14 DIAGNOSIS — N186 End stage renal disease: Secondary | ICD-10-CM | POA: Diagnosis not present

## 2020-09-14 DIAGNOSIS — Z992 Dependence on renal dialysis: Secondary | ICD-10-CM | POA: Diagnosis not present

## 2020-09-20 DIAGNOSIS — Z992 Dependence on renal dialysis: Secondary | ICD-10-CM | POA: Diagnosis not present

## 2020-09-20 DIAGNOSIS — N186 End stage renal disease: Secondary | ICD-10-CM | POA: Diagnosis not present

## 2020-09-21 ENCOUNTER — Encounter
Admit: 2020-09-21 | Discharge: 2020-09-21 | Payer: MEDICARE | Attending: Hematology & Oncology | Primary: Hematology & Oncology

## 2020-09-21 DIAGNOSIS — C9112 Chronic lymphocytic leukemia of B-cell type in relapse: Secondary | ICD-10-CM | POA: Diagnosis not present

## 2020-09-21 DIAGNOSIS — C9110 Chronic lymphocytic leukemia of B-cell type not having achieved remission: Secondary | ICD-10-CM | POA: Diagnosis not present

## 2020-09-28 ENCOUNTER — Other Ambulatory Visit: Payer: Medicare PPO | Admitting: Hospice

## 2020-09-28 ENCOUNTER — Other Ambulatory Visit: Payer: Self-pay

## 2020-09-28 DIAGNOSIS — Z515 Encounter for palliative care: Secondary | ICD-10-CM

## 2020-09-28 NOTE — Progress Notes (Signed)
Visit did not hold. Spouse - Christine Gibson said patient has been in the hospital since last week; plan is to discharge her to [facility]. Ample emotional support provided.

## 2020-09-30 DIAGNOSIS — C9112 Chronic lymphocytic leukemia of B-cell type in relapse: Principal | ICD-10-CM

## 2020-10-04 ENCOUNTER — Ambulatory Visit: Admit: 2020-10-04 | Discharge: 2020-10-05 | Payer: MEDICARE

## 2020-10-04 DIAGNOSIS — N289 Disorder of kidney and ureter, unspecified: Secondary | ICD-10-CM | POA: Diagnosis not present

## 2020-10-04 DIAGNOSIS — C9110 Chronic lymphocytic leukemia of B-cell type not having achieved remission: Secondary | ICD-10-CM | POA: Diagnosis not present

## 2020-10-04 DIAGNOSIS — R590 Localized enlarged lymph nodes: Secondary | ICD-10-CM | POA: Diagnosis not present

## 2020-10-04 DIAGNOSIS — R9089 Other abnormal findings on diagnostic imaging of central nervous system: Secondary | ICD-10-CM | POA: Diagnosis not present

## 2020-10-04 DIAGNOSIS — R161 Splenomegaly, not elsewhere classified: Secondary | ICD-10-CM | POA: Diagnosis not present

## 2020-10-05 ENCOUNTER — Encounter
Admit: 2020-10-05 | Discharge: 2020-10-05 | Payer: MEDICARE | Attending: Hematology & Oncology | Primary: Hematology & Oncology

## 2020-10-05 DIAGNOSIS — C9110 Chronic lymphocytic leukemia of B-cell type not having achieved remission: Secondary | ICD-10-CM | POA: Diagnosis not present

## 2020-10-13 ENCOUNTER — Telehealth: Payer: Self-pay | Admitting: Internal Medicine

## 2020-10-13 NOTE — Telephone Encounter (Signed)
Patient called in really confused. Stating she needed to reconnect with someone. Patient acted like something was wrong and unable to express herself. I told Donzetta Matters about what was going on. EM

## 2020-10-17 DIAGNOSIS — C9110 Chronic lymphocytic leukemia of B-cell type not having achieved remission: Principal | ICD-10-CM

## 2020-10-17 DIAGNOSIS — C9112 Chronic lymphocytic leukemia of B-cell type in relapse: Principal | ICD-10-CM

## 2020-10-18 DIAGNOSIS — G9341 Metabolic encephalopathy: Secondary | ICD-10-CM | POA: Diagnosis not present

## 2020-10-18 DIAGNOSIS — E1122 Type 2 diabetes mellitus with diabetic chronic kidney disease: Secondary | ICD-10-CM | POA: Diagnosis not present

## 2020-10-18 DIAGNOSIS — C9112 Chronic lymphocytic leukemia of B-cell type in relapse: Secondary | ICD-10-CM | POA: Diagnosis not present

## 2020-10-18 DIAGNOSIS — N185 Chronic kidney disease, stage 5: Secondary | ICD-10-CM | POA: Diagnosis not present

## 2020-10-18 DIAGNOSIS — F321 Major depressive disorder, single episode, moderate: Secondary | ICD-10-CM | POA: Diagnosis not present

## 2020-11-04 ENCOUNTER — Other Ambulatory Visit: Admit: 2020-11-04 | Discharge: 2020-11-05 | Payer: MEDICARE

## 2020-11-04 ENCOUNTER — Ambulatory Visit: Admit: 2020-11-04 | Discharge: 2020-11-05 | Payer: MEDICARE

## 2020-11-04 ENCOUNTER — Ambulatory Visit
Admit: 2020-11-04 | Discharge: 2020-11-05 | Payer: MEDICARE | Attending: Hematology & Oncology | Primary: Hematology & Oncology

## 2020-11-04 DIAGNOSIS — Z8673 Personal history of transient ischemic attack (TIA), and cerebral infarction without residual deficits: Secondary | ICD-10-CM | POA: Diagnosis not present

## 2020-11-04 DIAGNOSIS — C9112 Chronic lymphocytic leukemia of B-cell type in relapse: Secondary | ICD-10-CM | POA: Diagnosis not present

## 2020-11-04 DIAGNOSIS — Z992 Dependence on renal dialysis: Secondary | ICD-10-CM | POA: Diagnosis not present

## 2020-11-04 DIAGNOSIS — Z79899 Other long term (current) drug therapy: Secondary | ICD-10-CM | POA: Diagnosis not present

## 2020-11-04 DIAGNOSIS — N186 End stage renal disease: Secondary | ICD-10-CM | POA: Diagnosis not present

## 2020-11-04 DIAGNOSIS — R161 Splenomegaly, not elsewhere classified: Secondary | ICD-10-CM | POA: Diagnosis not present

## 2020-11-04 DIAGNOSIS — C9110 Chronic lymphocytic leukemia of B-cell type not having achieved remission: Principal | ICD-10-CM

## 2020-11-08 DIAGNOSIS — C9112 Chronic lymphocytic leukemia of B-cell type in relapse: Principal | ICD-10-CM

## 2020-11-08 MED ORDER — ZANUBRUTINIB 80 MG CAPSULE
ORAL_CAPSULE | Freq: Two times a day (BID) | ORAL | 5 refills | 30.00000 days | Status: CP
Start: 2020-11-08 — End: 2020-11-21

## 2020-11-10 DIAGNOSIS — C9112 Chronic lymphocytic leukemia of B-cell type in relapse: Principal | ICD-10-CM

## 2020-11-16 ENCOUNTER — Ambulatory Visit: Payer: Self-pay | Admitting: Podiatry

## 2020-11-16 DIAGNOSIS — F419 Anxiety disorder, unspecified: Secondary | ICD-10-CM | POA: Diagnosis not present

## 2020-11-16 DIAGNOSIS — F321 Major depressive disorder, single episode, moderate: Secondary | ICD-10-CM | POA: Diagnosis not present

## 2020-11-18 NOTE — Unmapped (Signed)
[facility] Specialty Medication Onboarding    Specialty Medication: Brukinsa capsule  Prior Authorization: Approved   Financial Assistance: No - copay  <$25  Final Copay/Day Supply: $0 / 30 days    Insurance Restrictions: None     Notes to Pharmacist:     The triage team has completed the benefits investigation and has determined that the patient is able to fill this medication at [facility]. Please contact the patient to complete the onboarding or follow up with the prescribing physician as needed.

## 2020-11-18 NOTE — Unmapped (Signed)
[facility]   Patient Onboarding/Medication Counseling    Ms. Cristina Fields is aware to hold on to medication and not start until advised by clinic    [name] is a 70 y.o. female with CLL who I am counseling today on initiation of therapy.  I am speaking to the patient.    Was a Nurse, learning disability used for this call? No    Verified patient's date of birth / HIPAA.    Specialty medication(s) to be sent: Hematology/Oncology: Brukinsa (zanubrutinib) 80 mg caps    Non-specialty medications/supplies to be sent: none    Medications not needed at this time: none     Brukinsa (Zanubrutinib)    Medication & Administration     Dosage: Take 2 capsules (160 mg total) by mouth once daily. Swallow whole with water. Do not open, break, or chew capsule.    Administration:   ??? May be administered with or without food.  ??? Swallow capsule whole with a full glass of water.  Do not open, break, crush or chew.    Adherence/Missed dose instructions:   ??? If you miss a dose, take the dose as soon as you remember on the same day.  If you do not think about the missed dose until the next day, skip the missed dose and go back to your normal time.  ??? Do not take 2 doses at the same time or extra doses.    Goals of Therapy     ??? To prevent disease progression    Side Effects & Monitoring Parameters     Commonly reported side effects  ??? Fatigue, loss of strength and energy  ??? Diarrhea, constipation  ??? Common cold symptoms, upper respiratory infection, cough  ??? Muscle pain  ??? Rash  ??? Decreased red blood cells, white blood cells and platelets  ??? Bruising    The following side effects should be reported to the provider:  ??? Signs of infection (fever >100.4, chills, mouth sores/irritation, sputum production)  ??? Signs of bleeding (vomiting or coughing up blood, blood that looks like coffee grounds, blood in the urine or black, red tarry stools, bruising that gets bigger without reason, any persistent or severe bleeding, impaired wound healing)  ??? Signs of low potassium (muscle pain or weakness, muscle cramps, abnormal heartbeat)  ??? Weakness on 1 side of the body, trouble speaking or thinking, change in balance, drooping on one side of the face, or blurred eyesight.  ??? Signs of high blood pressure (severe headache, dizziness, passing out, vision changes)  ??? Mole changes, skin growth  ??? Fast heartbeat, abnormal heartbeat, shortness of breath, chest pain  ??? Signs of anaphylaxis (wheezing, chest tightness, swelling of face, lips, tongue or throat)    Monitoring Parameters:   ??? Monitor CBC during treatment.   ??? Evaluate pregnancy status prior to use in females of reproductive potential.   ??? Monitor for signs/symptoms of atrial fibrillation/atrial flutter, bleeding, or infections.  ??? Monitor for toxicities in patients with hepatic impairment or severe renal impairment (or on dialysis).   ??? Monitor for second primary malignancies.   ??? Monitor adherence.    Contraindications, Warnings, & Precautions     ??? Cardiovascular effects: Atrial fibrillation and atrial flutter have occurred in a small percentage of patients; ? grade 3 events were reported rarely. Patients with cardiac risk factors, hypertension, and/or acute infections may be at increased risk.   ??? Hematologic effects: Grade 3 or 4 cytopenias have been reported.  May require treatment interruption, dose reduction, discontinuation, and/or growth factor support or transfusions.  ??? Hemorrhage: Fatal and serious hemorrhagic events have occurred in patients. Grade 3 or higher bleeding events have been reported in a small percentage of patients. Half of the patients who received zanubrutinib experienced bleeding events of any grade.  Bleeding events have occurred in patients with and without concomitant antiplatelet or anticoagulation therapy. Concurrent administration of zanubrutinib with antiplatelet or anticoagulant medications may further increase the risk of hemorrhage. Discontinue if intracranial hemorrhage (any grade) occurs. Consider the benefit-risk of withholding zanubrutinib for 3 to 7 days before and after surgery, depending upon the type of surgery and the risk of bleeding.  ??? Infection: Fatal and serious infections (including bacterial, viral, or fungal) and opportunistic infections have occurred in patients.  Grade 3 or higher infections occurred in nearly one-fourth of patients; pneumonia was the most common ? grade 3 infection. Infections due to hepatitis B virus reactivation have also occurred. Consider prophylaxis for herpes simplex virus, Pneumocystis jirovecii pneumonia, and other infections according to standard of care in patients at increased risk for infections.  ??? Secondary malignancies: Second primary malignancies have occurred. The most frequent second primary malignancy was skin cancer (basal cell carcinoma and squamous cell carcinoma of skin). Advise patients to use sun protection.  ??? Hepatic impairment: Monitor for toxicities in patients with hepatic impairment. Dose reduction is recommended in patients with severe impairment.  ??? Renal impairment: Monitor for toxicities in patients with severe renal impairment (CrCl <30 mL/minute) or on dialysis.  ??? Reproductive Considerations  o Evaluate pregnancy status prior to use in females of reproductive potential. Females of reproductive potential should use effective contraception during therapy and for ?1 week after the last dose. Males with female partners of reproductive potential should use effective contraception during therapy and for ?1 week after the last dose.  o Based on data from animal reproduction studies, in utero exposure to zanubrutinib may cause fetal harm.  o It is not known if zanubrutinib is present in breast milk.  Due to the potential for serious adverse reactions in the breastfed infant, breastfeeding is not recommended by the manufacturer during therapy or for ?2 weeks following the last dose.    Drug/Food Interactions     ??? Medication list reviewed in Epic. The patient was instructed to inform the care team before taking any new medications or supplements. Zanubrutinib may enhance the antiplatelet effect of Agents with Antiplatelet Properties like sertraline - monitor therapy  ??? Is the patient on any CYP3A4 inducers? No.  ??? Is the patient on any CYP3A4 inhibitors? No.  ??? Is the patient taking any antiplatelets? No.  ??? Is the patient taking any anticoagulants? No.  ??? Complete all age-appropriate inactivated vaccinations at least 2 weeks prior to starting. If vaccinated during therapy, revaccinate at least 3 months after discontinuation.   ??? Live-attenuated vaccines should not be given for at least 3 months after therapy. Exceptions: Smallpox and Monkeypox Vaccine     Storage, Handling Precautions, & Disposal     ??? Store at room temperature in the original container (do not use a pillbox or store with other medications).   ??? Caregivers helping administer medication should wear gloves and wash hands immediately after.    ??? Keep the lid tightly closed. Keep out of the reach of children and pets.  ??? Do not flush down a toilet or pour down a drain unless instructed to do so.  Check with your local  police department or fire station about drug take-back programs in your area.      Current Medications (including OTC/herbals), Comorbidities and Allergies     Current Outpatient Medications   Medication Sig Dispense Refill   ??? atorvastatin (LIPITOR) 40 MG tablet Take 40 mg by mouth daily.     ??? carvediloL (COREG) 12.5 MG tablet Take 12.5 mg by mouth Two (2) times a day.     ??? hydrALAZINE (APRESOLINE) 10 MG tablet Take 10 mg by mouth Three (3) times a day.     ??? lidocaine (XYLOCAINE) 5 % ointment Apply topically two (2) times a day as needed. Apply to rectum topically as needed for pain for up to two times a day     ??? losartan (COZAAR) 100 MG tablet Take 100 mg by mouth daily.     ??? NIFEdipine (ADALAT CC) 60 MG 24 hr tablet Take 60 mg by mouth daily.     ??? pantoprazole (PROTONIX) 40 MG tablet Take 40 mg by mouth daily.     ??? sertraline (ZOLOFT) 100 MG tablet Take 100 mg by mouth daily.     ??? traMADoL (ULTRAM) 50 mg tablet Take 50 mg by mouth every six (6) hours as needed for pain.     ??? zanubrutinib (BRUKINSA) capsule Take 2 capsules (160 mg total) by mouth Two (2) times a day. Swallow whole with water. Do not open, break, or chew capsule. 120 capsule 5   ??? zinc oxide 20 % ointment Apply 1 application topically as needed for dry skin. Apply to rectum topically every day for pain       No current facility-administered medications for this visit.       Allergies   Allergen Reactions   ??? Lisinopril Cough       Patient Active Problem List   Diagnosis   ??? 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    Appropriateness of Therapy     Is medication and dose appropriate based on diagnosis? Yes    Prescription has been clinically reviewed: Yes    Baseline Quality of Life Assessment      How many days over the past month did your CLL  keep you from your normal activities? For example, brushing your teeth or getting up in the morning. 0    Financial Information     Medication Assistance provided: Prior Authorization    Anticipated copay of $0 / 30 days reviewed with patient. Verified delivery address.    Delivery Information     Scheduled delivery date: 11/24/20    Expected start date: 11/24/20 after clinic go ahead (she has appt with Katie B. On 11/24/20)    Medication will be delivered via UPS to the prescription address in [facility].  This shipment will not require a signature.      Explained the services we provide at [facility] and that each month we would call to set up refills.  Stressed importance of returning phone calls so that we could ensure they receive their medications in time each month.  Informed patient that we should be setting up refills 7-10 days prior to when they will run out of medication.  A pharmacist will reach out to perform a clinical assessment periodically.  Informed patient that a welcome packet and a drug information handout will be sent.      Patient verbalized understanding of  the above information as well as how to contact the pharmacy at [phone] option 4 with any questions/concerns.  The pharmacy is open Monday through Friday 8:30am-4:30pm.  A pharmacist is available 24/7 via pager to answer any clinical questions they may have.    Patient Specific Needs     - Does the patient have any physical, cognitive, or cultural barriers? No    - Patient prefers to have medications discussed with  Patient     - Is the patient or caregiver able to read and understand education materials at a high school level or above? Yes    - Patient's primary language is  English     - Is the patient high risk? Yes, patient is taking oral chemotherapy. Appropriateness of therapy as been assessed    - Does the patient require a Care Management Plan? No     - Does the patient require physician intervention or other additional services (i.e. nutrition, smoking cessation, social work)? No      Bijal Siglin [facility] Specialty Pharmacist

## 2020-11-21 DIAGNOSIS — F419 Anxiety disorder, unspecified: Secondary | ICD-10-CM | POA: Diagnosis not present

## 2020-11-21 DIAGNOSIS — F321 Major depressive disorder, single episode, moderate: Secondary | ICD-10-CM | POA: Diagnosis not present

## 2020-11-21 DIAGNOSIS — N186 End stage renal disease: Secondary | ICD-10-CM | POA: Diagnosis not present

## 2020-11-21 DIAGNOSIS — C9112 Chronic lymphocytic leukemia of B-cell type in relapse: Principal | ICD-10-CM

## 2020-11-21 MED ORDER — ZANUBRUTINIB 80 MG CAPSULE
ORAL_CAPSULE | Freq: Every day | ORAL | 2 refills | 30.00000 days | Status: CP
Start: 2020-11-21 — End: 2020-12-23
  Filled 2020-11-23: qty 60, 30d supply, fill #0

## 2020-11-21 NOTE — Unmapped (Signed)
I spoke with patient Cristina Fields to confirm appointments on the following date(s): 11/24/2020.  Pt confirmed date and time.    Danford Bad

## 2020-11-22 DIAGNOSIS — N185 Chronic kidney disease, stage 5: Secondary | ICD-10-CM | POA: Diagnosis not present

## 2020-11-22 DIAGNOSIS — C9112 Chronic lymphocytic leukemia of B-cell type in relapse: Secondary | ICD-10-CM | POA: Diagnosis not present

## 2020-11-22 DIAGNOSIS — F321 Major depressive disorder, single episode, moderate: Secondary | ICD-10-CM | POA: Diagnosis not present

## 2020-11-22 DIAGNOSIS — E1122 Type 2 diabetes mellitus with diabetic chronic kidney disease: Secondary | ICD-10-CM | POA: Diagnosis not present

## 2020-11-22 DIAGNOSIS — F419 Anxiety disorder, unspecified: Secondary | ICD-10-CM | POA: Diagnosis not present

## 2020-11-22 DIAGNOSIS — G9341 Metabolic encephalopathy: Secondary | ICD-10-CM | POA: Diagnosis not present

## 2020-11-22 NOTE — Unmapped (Unsigned)
From Katie - Cristina Fields - can probably request follow-up with both of [place] about 2-3 weeks after you see her on 2/8. [facility] keeping a leeetle bit closer eye on her up front due to her kidneys.)

## 2020-11-24 ENCOUNTER — Institutional Professional Consult (permissible substitution): Admit: 2020-11-24 | Discharge: 2020-11-25 | Payer: MEDICARE | Attending: Pharmacist | Primary: Pharmacist

## 2020-11-24 DIAGNOSIS — L600 Ingrowing nail: Secondary | ICD-10-CM | POA: Diagnosis not present

## 2020-11-24 NOTE — Unmapped (Signed)
Gregary Signs, your schedule for 2/14 and 2/16 are full with the exception of New/EMERG slots.  Can we use one of those?  Please advise.

## 2020-11-24 NOTE — Unmapped (Signed)
Patient Cristina Fields was contacted today regarding rescheduling 11/29/2020 appt.'s to 2/14 or 2/16 with Langley Gauss.. Voicemail was left for patient with information to call back.

## 2020-11-24 NOTE — Unmapped (Signed)
Hi,     Cristina Fields contacted the [employer] regarding the following:    - Returning call she missed.  Didn't want to discuss with me.  Told me it was Dr. Lonni Fix but it looks like it was probably you reaching out.    Please contact at [phone].    Thanks in advance,    Vernie Ammons  [facility]   [phone]

## 2020-11-24 NOTE — Unmapped (Signed)
Pt prefers 2/14 and would like an afternoon appt.

## 2020-11-24 NOTE — Unmapped (Signed)
Cristina Fields is a 70 y.o. female with CLL who I am seeing in clinic today for oral chemotherapy education    Encounter Date: 11/24/2020    Current Treatment: starting zanubrutinib 160 mg ONCE daily (starting ~2/3, consider increasing to BID if tolerates)    For oral chemotherapy:  Pharmacy: [facility]   Medication Access: $0/month with grant     Interval History: Cristina Fields who is [facility] being seen by our team for initiation of therapy. She's previously received FCR, BR, and ibrutinib with complication of ischemic CVA. She was recently discontinued off apixaban. She also has CKD on HD Tues,Thurs,Sat. Per MD note, indication for tx = thrombocytopenia, anemia, and night sweats. She reports to me she's currently struggling with a rectal sore but her providers are working on providing ointments, etc to relieve this, but requiring about once daily tramadol for this pain.    On labs from 1/14, WBC 57.9, Hgb 10.2, PLT 111, ALC 38.3. On CMP, SCr 3.89 (on HD). BP elevated 1/14 but likely managed in conjunction with her HD/nephro team (on coreg, hydral, losartan, nifedipine).      Oncologic History:  Oncology History    No history exists.       Weight and Vitals:  Wt Readings from Last 3 Encounters:   11/04/20 49.9 kg (110 lb)     Temp Readings from Last 3 Encounters:   11/04/20 37.1 ??C (98.7 ??F) (Oral)   11/04/20 36.4 ??C (97.5 ??F) (Tympanic)     BP Readings from Last 3 Encounters:   11/04/20 176/74   11/04/20 165/65     Pulse Readings from Last 3 Encounters:   11/04/20 86   11/04/20 79       Pertinent Labs:  No visits with results within 1 Day(s) from this visit.   Latest known visit with results is:   Lab on 11/04/2020   Component Date Value Ref Range Status   ??? Sodium 11/04/2020 137  135 - 145 mmol/L Final   ??? Potassium 11/04/2020 4.2  3.4 - 4.5 mmol/L Final   ??? Chloride 11/04/2020 104  98 - 107 mmol/L Final   ??? Anion Gap 11/04/2020 5  5 - 14 mmol/L Final   ??? CO2 11/04/2020 28.0  20.0 - 31.0 mmol/L Final   ??? BUN 11/04/2020 13  9 - 23 mg/dL Final   ??? Creatinine 11/04/2020 3.89* 0.60 - 0.80 mg/dL Final   ??? BUN/Creatinine Ratio 11/04/2020 3   Final   ??? EGFR CKD-EPI Non-African American,* 11/04/2020 11* >=60 mL/min/1.86m2 Final   ??? EGFR CKD-EPI African American, Fem* 11/04/2020 13* >=60 mL/min/1.17m2 Final   ??? Glucose 11/04/2020 124  70 - 179 mg/dL Final   ??? Calcium 16/07/9603 8.6* 8.7 - 10.4 mg/dL Final   ??? Albumin 54/06/8118 2.9* 3.4 - 5.0 g/dL Final   ??? Total Protein 11/04/2020 5.5* 5.7 - 8.2 g/dL Final   ??? Total Bilirubin 11/04/2020 0.2* 0.3 - 1.2 mg/dL Final   ??? AST 14/78/2956 41* <=34 U/L Final   ??? ALT 11/04/2020 24  10 - 49 U/L Final   ??? Alkaline Phosphatase 11/04/2020 132* 46 - 116 U/L Final   ??? WBC 11/04/2020 57.9* 4.5 - 11.0 10*9/L Final   ??? RBC 11/04/2020 3.51* 4.00 - 5.20 10*12/L Final   ??? HGB 11/04/2020 10.2* 12.0 - 16.0 g/dL Final   ??? HCT 21/30/8657 32.0* 36.0 - 46.0 % Final   ??? MCV 11/04/2020 91.1  80.0 - 100.0 fL Final   ???  MCH 11/04/2020 29.1  26.0 - 34.0 pg Final   ??? MCHC 11/04/2020 32.0  31.0 - 37.0 g/dL Final   ??? RDW 70/62/3762 17.4* 12.0 - 15.0 % Final   ??? MPV 11/04/2020 11.4* 7.0 - 10.0 fL Final   ??? Platelet 11/04/2020 111* 150 - 440 10*9/L Final   ??? Neutrophils % 11/04/2020 12.6  % Final   ??? Lymphocytes % 11/04/2020 66.1  % Final   ??? Monocytes % 11/04/2020 2.2  % Final   ??? Eosinophils % 11/04/2020 1.0  % Final   ??? Basophils % 11/04/2020 1.3  % Final   ??? Absolute Neutrophils 11/04/2020 7.3  2.0 - 7.5 10*9/L Final   ??? Absolute Lymphocytes 11/04/2020 38.3* 1.5 - 5.0 10*9/L Final   ??? Absolute Monocytes 11/04/2020 1.3* 0.2 - 0.8 10*9/L Final   ??? Absolute Eosinophils 11/04/2020 0.6* 0.0 - 0.4 10*9/L Final   ??? Absolute Basophils 11/04/2020 0.8* 0.0 - 0.1 10*9/L Final   ??? Large Unstained Cells 11/04/2020 17* 0 - 4 % Final   ??? Macrocytosis 11/04/2020 Slight* Not Present Final   ??? Anisocytosis 11/04/2020 Slight* Not Present Final   ??? Hypochromasia 11/04/2020 Slight* Not Present Final   ??? Smear Review Comments 11/04/2020 See Comment* Undefined Final    Slide reviewed     ??? Pathologist Smear Interpretation 11/04/2020 Confirmed by Hemepath Specialist/Senior Tech  Confirmed by Hemepath Fellow, Confirmed by Hemepath Specialist/Senior Tech, Confirmed by Core Specialist/Senior Tech, To Be Accessioned - Case Report to Follow Final       Allergies:   Allergies   Allergen Reactions   ??? Lisinopril Cough       Drug Interactions: none, avoid 3A4 inh/ind      Current Medications:  Current Outpatient Medications   Medication Sig Dispense Refill   ??? atorvastatin (LIPITOR) 40 MG tablet Take 40 mg by mouth daily.     ??? carvediloL (COREG) 12.5 MG tablet Take 12.5 mg by mouth Two (2) times a day.     ??? hydrALAZINE (APRESOLINE) 10 MG tablet Take 10 mg by mouth Three (3) times a day.     ??? lidocaine (XYLOCAINE) 5 % ointment Apply topically two (2) times a day as needed. Apply to rectum topically as needed for pain for up to two times a day     ??? losartan (COZAAR) 100 MG tablet Take 100 mg by mouth daily.     ??? NIFEdipine (ADALAT CC) 60 MG 24 hr tablet Take 60 mg by mouth daily.     ??? pantoprazole (PROTONIX) 40 MG tablet Take 40 mg by mouth daily.     ??? sertraline (ZOLOFT) 100 MG tablet Take 100 mg by mouth daily.     ??? traMADoL (ULTRAM) 50 mg tablet Take 50 mg by mouth every six (6) hours as needed for pain.     ??? zanubrutinib (BRUKINSA) capsule Take 2 capsules (160 mg total) by mouth daily. Swallow whole with water. Do not open, break, or chew capsule. 60 capsule 2   ??? zinc oxide 20 % ointment Apply 1 application topically as needed for dry skin. Apply to rectum topically every day for pain       No current facility-administered medications for this visit.       Adherence: no issues identified      Assessment: [name] is a 70 y.o. female with CLL being initiated on zanubrutinib 160 mg daily. We are starting with once daily dosing cautiously before increasing to BID due to her  requiring HD and having limited data/guidance in CKD patients, however based on molecular size, protein binding, and having minimal renal metabolic implications, I don't expect that her CKD or HD status should impact zanubrutinib significantly.    Plan:   - START zanubrutinib 160 mg ONCE daily. Will increase to BID at MD discretion based on her response/tolerability. Suggested she dose in the evening so that HD should not impact PK/PD  - Discussed educational points with patients including but not limited to: logistics (getting medication delivered from [facility] monthly), access ($0/month with grant), administration (for now 2 tabs once daily with or without food), ADRs (should be better tolerated, we will monitor for AF, HTN, bleeding, HA, diarrhea, nausea; expect lymphocytosis).  - She is off eliquis, should avoid any medications that can increase bleed risk (NSAIDs, aspirin)  - PCP/nephro should continue to monitor and adjust therapies for her BP and other chronic conditions. Acutely she is struggling with a rectal sore, she should notify provider if this appears infected or is febrile.   - RTC in 1-2 weeks for labs and close follow-up with our team. Also being followed by Dr. Juel Burrow with the [facility].     F/u:  Future Appointments   Date Time Provider Department Center   11/29/2020 12:45 PM ADULT ONC LAB [facility]   11/29/2020  1:30 PM Sean Marrian Salvage, AGNP [id number]       Manfred Arch, PharmD, BCOP, CPP  Pager: [phone]        The patient reports they are currently: at home. I spent 15 minutes on the phone with the patient on the date of service. I spent an additional 10 minutes on pre- and post-visit activities on the date of service.     The patient was physically located in [place] or a state in which I am permitted to provide care. The patient and/or parent/guardian understood that s/he may incur co-pays and cost sharing, and agreed to the telemedicine visit. The visit was reasonable and appropriate under the circumstances given the patient's presentation at the time.    The patient and/or parent/guardian has been advised of the potential risks and limitations of this mode of treatment (including, but not limited to, the absence of in-person examination) and has agreed to be treated using telemedicine. The patient's/patient's family's questions regarding telemedicine have been answered.     If the visit was completed in an ambulatory setting, the patient and/or parent/guardian has also been advised to contact their provider???s office for worsening conditions, and seek emergency medical treatment and/or call 911 if the patient deems either necessary.

## 2020-11-28 DIAGNOSIS — F321 Major depressive disorder, single episode, moderate: Secondary | ICD-10-CM | POA: Diagnosis not present

## 2020-11-28 DIAGNOSIS — F419 Anxiety disorder, unspecified: Secondary | ICD-10-CM | POA: Diagnosis not present

## 2020-11-28 NOTE — Unmapped (Signed)
Thank you, labs have ben moved and request sent to [facility] to change clinic appt.  Pt has been notified.

## 2020-11-28 NOTE — Unmapped (Signed)
I spoke with patient Cristina Fields to confirm appointments on the following date(s): 12/07/2020.  Pt confirmed new date and times.    Danford Bad

## 2020-11-28 NOTE — Unmapped (Signed)
Good morning, just wanted to check back in on this request.  Please see below and advise.

## 2020-11-29 MED ORDER — HYDROCORTISONE 1 % TOPICAL CREAM
0 refills | 0 days | Status: CP
Start: 2020-11-29 — End: 2021-11-29

## 2020-11-29 MED ORDER — LORATADINE 10 MG TABLET
ORAL_TABLET | Freq: Every day | ORAL | 2 refills | 30.00000 days | Status: CP
Start: 2020-11-29 — End: 2021-11-29

## 2020-11-29 NOTE — Unmapped (Signed)
[facility] Triage Note     Patient: Cristina Fields     Reason for call:  return call    Time call returned: [phone]       Pt husband calling to report that the pt started zanubrutinib on Thursday and now has a rash.  At first, they thought it may have been coming from the cherry juice that she drank on Thursday also, but the rash is not going away and is now changing to hives.  [name] does not know if it is all over her body and the pt is currently at dialysis so he can't check.  He states that her face is very red and painful and this is where he saw the hives.      He is going to call the pt to tell her to answer her phone so that she can receive a call from the pharmacist.

## 2020-11-29 NOTE — Unmapped (Signed)
I called Ms. Sanna to discuss concern with rash after starting zanubrutinib.    She started zanubrutinib 160 mg ONCE daily in the evening on 2/3. On Saturday (about 48h later), she noted 3 bumps on cheek, and noticed it had spread to forehead the following day. The night prior, she had vomiting all night long. She does not recall eating anything that could have triggered this. She denies fever, nausea, diarrhea. She has not had any vomiting since that evening, otherwise feels quite well. The rash on her face is not itchy and has not spread further. She reports it's not worse nor better. She denies any changes in soaps, detergents, creams.     Plan:  - Continue zanubrutinib 160 mg every evening  - START daily claritin (sent to local pharmacy, also OTC)  - START hydrocortisone 1% cream on effected area 1-2 times daily until improved (sent to local pharmacy, also OTC). Moisturize with mild product.  - Call us if any symptoms worsen related to rash or the vomiting ensues again.  - She has in person follow-up with NP on 2/16 with labs      Manfred Arch, PharmD, BCOP, CPP  Pager: [phone]

## 2020-11-29 NOTE — Unmapped (Signed)
Hi,     [name] has contacted the Communication [facility] in regards to the following symptom:     Rash: new onset    Please contact [name] at [phone]    Check Indicates criteria has been reviewed and confirmed with the patient:    [x]  Preferred Name   [x]  DOB and/or MR#  [x]  Preferred Contact Method  [x]  Phone Number(s)   []  MyChart     A page or telephone call has been made to the corresponding clinic.     Thank you,  Drema Balzarine   [facility]   [phone]

## 2020-12-02 DIAGNOSIS — H40013 Open angle with borderline findings, low risk, bilateral: Secondary | ICD-10-CM | POA: Diagnosis not present

## 2020-12-02 DIAGNOSIS — H524 Presbyopia: Secondary | ICD-10-CM | POA: Diagnosis not present

## 2020-12-02 DIAGNOSIS — H2513 Age-related nuclear cataract, bilateral: Secondary | ICD-10-CM | POA: Diagnosis not present

## 2020-12-07 ENCOUNTER — Ambulatory Visit: Admit: 2020-12-07 | Discharge: 2020-12-07 | Payer: MEDICARE | Attending: Adult Health | Primary: Adult Health

## 2020-12-07 ENCOUNTER — Other Ambulatory Visit: Admit: 2020-12-07 | Discharge: 2020-12-07 | Payer: MEDICARE

## 2020-12-07 DIAGNOSIS — C9112 Chronic lymphocytic leukemia of B-cell type in relapse: Secondary | ICD-10-CM | POA: Diagnosis not present

## 2020-12-07 LAB — CBC W/ AUTO DIFF
BASOPHILS ABSOLUTE COUNT: 0.1 10*9/L (ref 0.0–0.1)
BASOPHILS RELATIVE PERCENT: 0.2 %
EOSINOPHILS ABSOLUTE COUNT: 0.3 10*9/L (ref 0.0–0.4)
EOSINOPHILS RELATIVE PERCENT: 0.8 %
HEMATOCRIT: 26.3 % — ABNORMAL LOW (ref 36.0–46.0)
HEMOGLOBIN: 8.6 g/dL — ABNORMAL LOW (ref 12.0–16.0)
LARGE UNSTAINED CELLS: 6 % — ABNORMAL HIGH (ref 0–4)
LYMPHOCYTES ABSOLUTE COUNT: 25.5 10*9/L — ABNORMAL HIGH (ref 1.5–5.0)
LYMPHOCYTES RELATIVE PERCENT: 75.7 %
MEAN CORPUSCULAR HEMOGLOBIN CONC: 32.6 g/dL (ref 31.0–37.0)
MEAN CORPUSCULAR HEMOGLOBIN: 30 pg (ref 26.0–34.0)
MEAN CORPUSCULAR VOLUME: 92.2 fL (ref 80.0–100.0)
MEAN PLATELET VOLUME: 14.1 fL — ABNORMAL HIGH (ref 7.0–10.0)
MONOCYTES ABSOLUTE COUNT: 0.7 10*9/L (ref 0.2–0.8)
MONOCYTES RELATIVE PERCENT: 2 %
NEUTROPHILS ABSOLUTE COUNT: 5.1 10*9/L (ref 2.0–7.5)
NEUTROPHILS RELATIVE PERCENT: 15.2 %
PLATELET COUNT: 65 10*9/L — ABNORMAL LOW (ref 150–440)
RED BLOOD CELL COUNT: 2.85 10*12/L — ABNORMAL LOW (ref 4.00–5.20)
RED CELL DISTRIBUTION WIDTH: 17.3 % — ABNORMAL HIGH (ref 12.0–15.0)
WBC ADJUSTED: 33.7 10*9/L — ABNORMAL HIGH (ref 4.5–11.0)

## 2020-12-07 LAB — COMPREHENSIVE METABOLIC PANEL
ALBUMIN: 3.1 g/dL — ABNORMAL LOW (ref 3.4–5.0)
ALKALINE PHOSPHATASE: 100 U/L (ref 46–116)
ALT (SGPT): 12 U/L (ref 10–49)
ANION GAP: 10 mmol/L (ref 5–14)
AST (SGOT): 19 U/L (ref <=34)
BILIRUBIN TOTAL: 0.3 mg/dL (ref 0.3–1.2)
BLOOD UREA NITROGEN: 27 mg/dL — ABNORMAL HIGH (ref 9–23)
BUN / CREAT RATIO: 6
CALCIUM: 8.3 mg/dL — ABNORMAL LOW (ref 8.7–10.4)
CHLORIDE: 100 mmol/L (ref 98–107)
CO2: 27 mmol/L (ref 20.0–31.0)
CREATININE: 4.4 mg/dL — ABNORMAL HIGH
EGFR CKD-EPI AA FEMALE: 11 mL/min/{1.73_m2} — ABNORMAL LOW (ref >=60)
EGFR CKD-EPI NON-AA FEMALE: 10 mL/min/{1.73_m2} — ABNORMAL LOW (ref >=60)
GLUCOSE RANDOM: 101 mg/dL (ref 70–179)
POTASSIUM: 4.2 mmol/L (ref 3.4–4.5)
PROTEIN TOTAL: 5.3 g/dL — ABNORMAL LOW (ref 5.7–8.2)
SODIUM: 137 mmol/L (ref 135–145)

## 2020-12-07 LAB — SLIDE REVIEW

## 2020-12-07 NOTE — Unmapped (Unsigned)
zanubrutinib - once daily  Rash?  From Katie - Laquan Ludden - can probably request follow-up with both of [place] about 2-3 weeks after you see her on 2/8. (We're keeping a leeetle bit closer eye on her up front due to her kidneys.)     Received Evusheld    Follow Up Visit Note    Patient Name: Cristina Fields  Patient Age: 70 y.o.  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 female who presents for follow up of relapsed/refractory CLL.    Therapy: zanubrutinib 160mg  ONCE daily - chosen as safest option with dialysis and PPI use. See 1/14 note for discussion of treatment options.  Treatment indication: significant drenching night sweats, anemia and thrombocytopenia.    She has done fairly well in the first few weeks of therapy***    Plans and Recommendations:  1. CLL  - see above    2. Health maintenance  - vaccines:  Has gotten 3 covid vaccines and seasonal flu vaccine  - After identifying the patient as meeting the EUA indications and institutional guidelines for use of Evusheld (tixagevimab co-packaged with cilgavimab), I discussed with the patient the indications and alternates to Evusheld  as pre-exposure prophylaxis during the COVID-19 pandemic under the FDA Emergency Use Authorization.  I described the meaning of EUA status, the administration, potential toxicities and limitations of Evusheld for prevention of severe COVID-19 infection.  I provided the patient with the FDA EUA FAQ and information for patients and caregivers documents. After this discussion and review of the provided information, the patient elected to receive Evusheld.    Due to this patient's diagnosis, she is at significant risk for subsequent morbidity and/or mortality.    I spent a total of 80 minutes face-to-face and non-face-to-face in the care of this patient, which includes all pre, intra, and post visit time on the date of service.    These services include the following elements of medical decision making:  addressing a hematologic cancer that poses a threat to life and/or bone marrow function  interpretation of diagnostic test reports or ordering of diagnostic tests and independent interpretation of diagnostic tests      History of Present Illness:     [name] is a 70 y.o. 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    BM Bx 08/20/19 showed CLL (50% involvement in a 30% cellular marrow)- kappa restricted, positive for CD5, CD23 (dim partial), and CD19, trisomy 12 and unmutated IGHV, no findings to suggest MDS    Prior therapy:  FCR x6 at [facility] beginning in 06/2011 for rapid LDT, attained a CR    Relapse 05/2014    09/22/2015 bendamustine (90 mg/m2) began (no Ritux first cycle)  10/20/2015: BR, severe infusion reaction prompting MICU stay  11/17/15 BR, benda reduced to 70 mg/m2, ritux tolerated  01/2016 - 6th cycle BR completed   03/2016 PET consistent with response    Relapse in 2018    02/20/18 ibrutinib 140 mg daily started (dose rec by pharmacy due to CKD)    04/11/20 ibrutinib held when pt admitted for acute cerebellar infarct    04/26/20 admitted for ritux re-initiation with plan for 4 weeks in setting of rapid relapse (WBC up to 88K, PET not concerning for transformation)  05/10/20 chlorambucil started     07/12/20 cycle 3 ritux - cycle 4 declined      Multiple infectious complications throughout course, received IVIG support    Kidney disease though 2/t CLL vs diabetic nephropathy, 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     Since leaving hospital in late December, she's been at a rehab facility. She reports that she's had drenching night sweats most nights which represents a marked increase since prior. Appetite is not great - she eats though has lost some weight over the past many months. She does report that she thinks her appetite is better than when she left the hospital.     REVIEW OF SYSTEMS:   CONSTITUTIONAL: No fevers, chills, + weight loss,+ drencinh night sweats.   HEENT: Eyes: No blurred vision. ENT: No earache, sore throat or runny nose.   CARDIOVASCULAR: No chest pain, palpitations.  RESPIRATORY: No cough, shortness of breath, PND or orthopnea.   GASTROINTESTINAL: No nausea, vomiting or diarrhea.   GENITOURINARY: No dysuria, frequency or urgency.   MUSCULOSKELETAL: No muscle pain or weakness.  SKIN: No rashes or other skin complaints.  NEUROLOGIC: No headaches, paresthesias, fasciculations, seizures.  PSYCHIATRIC: No disorder of thought or mood.   ENDOCRINE: No heat or cold intolerance, polyuria or polydipsia.   HEMATOLOGICAL: No easy bruising or bleeding.      Oncology History:    Oncology History    No history exists.       No past medical history on file.   No past surgical history on file.     No family history on file.    Social History     Tobacco Use   ??? Smoking status: Not on file   ??? Smokeless tobacco: Not on file   Substance Use Topics   ??? Alcohol use: Not on file   ??? Drug use: Not on file         Allergies   Allergen Reactions   ??? Lisinopril Cough and Other (See Comments)         Current Outpatient Medications   Medication Sig Dispense Refill   ??? atorvastatin (LIPITOR) 40 MG tablet Take 40 mg by mouth daily.     ??? carvediloL (COREG) 12.5 MG tablet Take 12.5 mg by mouth Two (2) times a day.     ??? hydrALAZINE (APRESOLINE) 10 MG tablet Take 10 mg by mouth Three (3) times a day.     ??? hydrocortisone 1 % cream Apply to affected area 2 times daily 30 g 0   ??? lidocaine (XYLOCAINE) 5 % ointment Apply topically two (2) times a day as needed. Apply to rectum topically as needed for pain for up to two times a day     ??? loratadine (CLARITIN) 10 mg tablet Take 1 tablet (10 mg total) by mouth daily. 30 tablet 2   ??? losartan (COZAAR) 100 MG tablet Take 100 mg by mouth daily.     ??? NIFEdipine (ADALAT CC) 60 MG 24 hr tablet Take 60 mg by mouth daily.     ??? oxyCODONE (OXY-IR) 5 mg capsule Take 2.5 mg by mouth every six (6) hours as needed.     ??? pantoprazole (PROTONIX) 40 MG tablet Take 40 mg by mouth daily.     ??? sertraline (ZOLOFT) 100 MG tablet Take 100  mg by mouth daily.     ??? zanubrutinib (BRUKINSA) capsule Take 2 capsules (160 mg total) by mouth daily. Swallow whole with water. Do not open, break, or chew capsule. 60 capsule 2   ??? amLODIPine (NORVASC) 10 MG tablet Take 1 tablet by mouth daily. (Patient not taking: Reported on 12/07/2020)     ??? traMADoL (ULTRAM) 50 mg tablet Take 50 mg by mouth every six (6) hours as needed for pain. (Patient not taking: Reported on 12/07/2020)     ??? zinc oxide 20 % ointment Apply 1 application topically as needed for dry skin. Apply to rectum topically every day for pain (Patient not taking: Reported on 12/07/2020)       No current facility-administered medications for this visit.         Physical exam:  Vitals:    12/07/20 1500   BP: 164/64   Pulse: 83   Resp: 16   Temp: 37.3 ??C (99.1 ??F)   SpO2: 100%      General: Resting in no apparent distress, thin though not overly frail appearing  HEENT:  Pupils are equal, round and reactive to light.  There is no scleral icterus and no conjunctival injection.  The oral mucosa does not demonstrate ulceration, erythema, exudate or purpura.  Nares show no bleeding.  No thyromegaly.  No jugular venous distention.    Lymph node exam:  No lymphadenopathy in the occipital, auricular, anterior/posterior cervical, submental, supraclavicular, axillary, inguinal regions.  Heart:  Regular rate and rhythm.  Normal S1 and S2 without S3 or S4.  There are no murmurs, gallops or rubs.  Lungs:  Breathing is unlabored and patient is speaking full sentences with ease.  No stridor.  Auscultation of lung fields reveals normal air movement without rales, rhonchi or crackles.    Gastrointestinal:  No distention or pain on palpation.  Bowel sounds are present and normal in quality.  There is no palpable hepatomegaly or splenomegaly.  No palpable masses.  Skin:  No rashes, petechiae or purpura.  No areas of skin breakdown.  Musculoskeletal:  There are no grossly-evident joint effusions or deformities.  Range of motion about the shoulder, elbow, hips and knees is grossly normal.  There is no pain on palpation of the spinous processes of the cervical, thoracic or lumbar vertebral bodies.  Psychiatric:  Alert and oriented to person, place, time and situation.  Range of affect is appropriate.    Neurologic:  Able to get onto exam table without assistance, slow but steady gait  Extremities:  Appear well-perfused, with radial and dorsalis pedis pulses 2+.  There is no clubbing, edema or cyanosis.    ECOG Performance Status:     Orders/Results:  Labs and pathology have been reviewed and pertinent results are as follows:    Lab on 12/07/2020   Component Date Value   ??? Sodium 12/07/2020 137    ??? Potassium 12/07/2020 4.2    ??? Chloride 12/07/2020 100    ??? Anion Gap 12/07/2020 10    ??? CO2 12/07/2020 27.0    ??? BUN 12/07/2020 27*   ??? Creatinine 12/07/2020 4.40*   ??? BUN/Creatinine Ratio 12/07/2020 6    ??? EGFR CKD-EPI Non-African* 12/07/2020 10*   ??? EGFR CKD-EPI African Ame* 12/07/2020 11*   ??? Glucose 12/07/2020 101    ??? Calcium 12/07/2020 8.3*   ??? Albumin 12/07/2020 3.1*   ??? Total Protein 12/07/2020 5.3*   ??? Total Bilirubin 12/07/2020 0.3    ??? AST 12/07/2020  19    ??? ALT 12/07/2020 12    ??? Alkaline Phosphatase 12/07/2020 100    ??? WBC 12/07/2020 33.7*   ??? RBC 12/07/2020 2.85*   ??? HGB 12/07/2020 8.6*   ??? HCT 12/07/2020 26.3*   ??? MCV 12/07/2020 92.2    ??? [facility] 12/07/2020 30.0    ??? MCHC 12/07/2020 32.6    ??? RDW 12/07/2020 17.3*   ??? MPV 12/07/2020 14.1*   ??? Platelet 12/07/2020 65*   ??? Neutrophils % 12/07/2020 15.2    ??? Lymphocytes % 12/07/2020 75.7    ??? Monocytes % 12/07/2020 2.0    ??? Eosinophils % 12/07/2020 0.8    ??? Basophils % 12/07/2020 0.2    ??? Absolute Neutrophils 12/07/2020 5.1    ??? Absolute Lymphocytes 12/07/2020 25.5*   ??? Absolute Monocytes 12/07/2020 0.7    ??? Absolute Eosinophils 12/07/2020 0.3    ??? Absolute Basophils 12/07/2020 0.1    ??? Large Unstained Cells 12/07/2020 6*   ??? Macrocytosis 12/07/2020 Slight*   ??? Anisocytosis 12/07/2020 Slight*   ??? 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    This electronic signature is attestation that the pathologist personally reviewed the submitted material(s) and the final diagnosis reflects that evaluation.         PET 10/04/20  FINDINGS:  HEAD, NECK and SUPRACLAVICULAR REGIONS: 1.1 cm left level 2 hypermetabolic lymph node (CT 86). No other suspicious hypermetabolic lesions or lymph nodes. Evaluation is partially limited by metallic streak artifact from dental amalgam.  ??  CHEST:  Thyroid: Grossly unremarkable.  Breasts: Grossly unremarkable.  Lungs and Pleura: No suspicious hypermetabolic nodules. No pleural effusion. 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  Mediastinum, Hila and Axillae: No suspicious hypermetabolic lymph nodes.  Cardiovascular: [Moderate coronary artery and aortic arch atherosclerotic calcifications. No pericardial effusion.  ??  ABDOMEN and PELVIS:  Liver: No suspicious pericardial effusion.  ??  ABDOMEN and PELVIS:  Liver: No suspicious hypermetabolic lesions.  Gallbladder: No discrete cholelithiasis. Trace echogenic calcified sludge near gallbladder neck.  Spleen: No suspicious hypermetabolic lesions or diffuse uptake. Splenomegaly. Subtle focus of hypoattenuation measuring approximately 3.6 x 2.4 cm with decreased metabolic activity compared to the remainder of the spleen (CT 183).  Pancreas: No suspicious hypermetabolic lesions.  GI/Peritoneum/Mesentery: No suspicious hypermetabolic lesions or diffuse uptake. Scattered locules of intraperitoneal free air, likely related to indwelling peritoneal dialysis catheter with tip coiled in the pelvis. Trace pelvic ascites.  Adrenals: No suspicious hypermetabolic lesions.  Kidneys: Approximately 2.8 x 2.1 cm soft tissue lesion in the anterior left kidney (CT 198) without hypermetabolic activity. Scattered hypoattenuating renal cystic lesions, likely simple cysts. No hydronephrosis. No radiopaque renal or ureteral calculi.  GU: Grossly unremarkable.  Adenopathy: No suspicious hypermetabolic lymph nodes.  Vasculature: Moderate to severe calcified atherosclerotic disease throughout the visualized vasculature.   ??  MUSCULOSKELETAL: No  suspicious osseous or soft tissue lesions. Approximately 6.5 x 5.2 cm nonhypermetabolic hypoattenuating fluid collection in the left gluteal musculature (CT 267). Mildly increased uptake overlying the bilateral greater trochanters.  ??  IMPRESSION:  -1.1 cm left level 2 mildly hypermetabolic lymph node, concerning for involvement by known CLL. No other suspicious hypermetabolic foci. Note that indolent lymphomas such as CLL may not be very avid.  ??  -Splenomegaly, likely related to known CLL. Nonhypermetabolic hypoattenuating focus measuring up to approximately 3.6 cm in the central spleen, indeterminate.  ??  -Nonhypermetabolic 2.8 cm soft tissue lesion in the anterior left kidney. Recommend correlation with cross-sectional imaging for further evaluation.  ??  -Nonhypermetabolic approximately 6.5 cm hypoattenuating fluid collection the left gluteal musculature, question old hematoma/seroma.  ??  -Increased radiotracer uptake overlying the greater trochanters, query trochanteric bursitis.  ??  -Additional chronic/incidental findings as above.

## 2020-12-07 NOTE — Unmapped (Incomplete)
Initial Visit Note    Patient Name: Cristina Fields  Patient Age: 70 y.o.  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 female who presents for follow up of CLL.    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    Most recently rituximab and chlorambucil though this was stopped in appx November.    She currently has multiple indications to warrant consideration of CLL therapy - significant drenching night sweats, anemia and thrombocytopenia.    Therapy options are limited due to need for dialysis. Specifically, venetoclax would be particularly risky due to her CKD putting her at much higher risk for issues related to tumor lysis syndrome.    Other options would include a re-trial of a BTK inhibitor. I think this is her best bet though I would favor a second generation BTKi (acala or zanu, whichever could get covered) given her other comorbidities which make the higher toxicity profile of ibrutinib less desirable. Overall it does not appear that her CVA was due to the ibrutinib given that afib was never identified and given that another stroke occurred in absence of ibrut use.    Reviewed AEs of acala/zanu including bleeding risk. She is no longer taking apixaban. I do see pantoprazole on her med list - if requires a PPI, would need to get zanu given that acala not absorbed in setting of concurrent PPI. Alternatively could switch to a H2 blocker and just avoid by 2 hours around the acala dosing.    Pts with ESRD not eligible for trials leading to these drugs' approvals so little to no information on dosing. Zanu package insert says Monitor for BRUKINSA  adverse reactions in patients on dialysis without a mention of any dose reduction though in her case I would favor initiating either acala or zanu at once daily dosing as she would be high risk for toxicity, and then increasing to usual BID dose if tolerated.    Another possibility would be a PI3K inhibitor like duvelisib or idela (usually given with ritux though can be used as a monotherapy). These drugs can have reasonable efficacy in the setting of ibrut intolerance (but not following progression while on a BTKi). However given the number of potential immune and infectious AEs from that drug class, that would be my second choice following BTKi rechallenge.    Per shared decision-making and insurance coverage, she began zanubritinib 160 mg daily about three weeks ago. She is tolerating well except for a facial rash with 24 hours of nausea/vomiting manifesting one week after starting drug, but rash is resolving and nausea/vomiting is fully resolved.     Today her WBC is down to 33.7 (from 57.9 at initiation) and ANC is 5.1 (from 7.3). Today's Hgb/Hct is 8.6/26.3 (from 10.2/32.0) and platelets are now down to 65 (from 111).     Plans and Recommendations:  1. CLL  - continue zanubritunib    2. Health maintenance  - vaccines:  Has gotten 3 covid vaccines and seasonal flu vaccine  -Received Evusheld on 11/04/2020.    Due to this patient's diagnosis, she is at significant risk for subsequent morbidity and/or mortality.    I spent a total of 80 minutes face-to-face and non-face-to-face in the care of this patient, which includes all pre, intra, and post visit time on the date of service.  These services include the following elements of medical decision making:  addressing a hematologic cancer that poses a threat to life and/or bone marrow function  interpretation of diagnostic test reports or ordering of diagnostic tests and independent interpretation of diagnostic tests      History of Present Illness:     Cristina Fields is a 70 y.o. 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    BM Bx 08/20/19 showed CLL (50% involvement in a 30% cellular marrow)- kappa restricted, positive for CD5, CD23 (dim partial), and CD19, trisomy 12 and unmutated IGHV, no findings to suggest MDS    Prior therapy:  FCR x6 at [facility] beginning in 06/2011 for rapid LDT, attained a CR    Relapse 05/2014    09/22/2015 bendamustine (90 mg/m2) began (no Ritux first cycle)  10/20/2015: BR, severe infusion reaction prompting MICU stay  11/17/15 BR, benda reduced to 70 mg/m2, ritux tolerated  01/2016 - 6th cycle BR completed   03/2016 PET consistent with response    Relapse in 2018    02/20/18 ibrutinib 140 mg daily started (dose rec by pharmacy due to CKD)    04/11/20 ibrutinib held when pt admitted for acute cerebellar infarct    04/26/20 admitted for ritux re-initiation with plan for 4 weeks in setting of rapid relapse (WBC up to 88K, PET not concerning for transformation)    05/10/20 chlorambucil started     07/12/20 cycle 3 ritux - cycle 4 declined      Multiple infectious complications throughout course, received IVIG support    Kidney disease though 2/t CLL vs diabetic nephropathy, 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     Since leaving hospital in late December, she's been at a rehab facility. She reports that she's had drenching night sweats most nights which represents a marked increase since prior.     Appetite is not great - she eats though has lost some weight over the past many months. She does report that she thinks her appetite is better than when she left the hospital.     Interval History  Since her last visit, she and her husband feel like she is gaining ground. Her energy and activity tolerance are increased, though she still experiences dyspnea on exertion. Appetite is increased and she has maintained weight since last visit. She is still living at the [facility] for the time being. Criteria for returning home are somewhat unclear, though she and her husband anticipate she should be ready to come home in the next few weeks. She is receiving dialysis 3x/wk on Tu/Th/Sa with no complications.    About one week after starting zanubritinub she experienced a maculopapular rash on her face with mild pruritis accompanied by one night with nausea and vomiting. The nausea/vomiting resolved within one day and has not returned. Rash is slowly improving with daily loratidine and hydrocortisone cream as recommended by PharmD.     She continues to have drenching night sweats every night. Denies fever/chills, bleeding/bruising and lymphadenopathy, GI upset, changes in bowel/bladder elimination.     REVIEW OF SYSTEMS:   CONSTITUTIONAL: No fevers, chills, - weight loss,+ drenching night sweats.   HEENT: Eyes: No blurred vision. ENT: No earache, sore throat or runny nose.  CARDIOVASCULAR: No chest pain, palpitations.  RESPIRATORY: No cough, shortness of breath, PND or orthopnea.   GASTROINTESTINAL: No nausea, vomiting or diarrhea.   GENITOURINARY: No dysuria, frequency or urgency.   MUSCULOSKELETAL: No muscle pain or weakness.  SKIN: Rash on face only. No itching.  NEUROLOGIC: No headaches, paresthesias, fasciculations, seizures.  PSYCHIATRIC: No disorder of thought or mood.   ENDOCRINE: No heat or cold intolerance, polyuria or polydipsia.   HEMATOLOGICAL: No easy bruising or bleeding.      Oncology History:    Oncology History    No history exists.       No past medical history on file.   No past surgical history on file.     No family history on file.    Social History     Tobacco Use   ??? Smoking status: Not on file   ??? Smokeless tobacco: Not on file   Substance Use Topics   ??? Alcohol use: Not on file   ??? Drug use: Not on file         Allergies   Allergen Reactions   ??? Lisinopril Cough and Other (See Comments)         Current Outpatient Medications   Medication Sig Dispense Refill   ??? atorvastatin (LIPITOR) 40 MG tablet Take 40 mg by mouth daily.     ??? carvediloL (COREG) 12.5 MG tablet Take 12.5 mg by mouth Two (2) times a day.     ??? hydrALAZINE (APRESOLINE) 10 MG tablet Take 10 mg by mouth Three (3) times a day.     ??? hydrocortisone 1 % cream Apply to affected area 2 times daily 30 g 0   ??? lidocaine (XYLOCAINE) 5 % ointment Apply topically two (2) times a day as needed. Apply to rectum topically as needed for pain for up to two times a day     ??? loratadine (CLARITIN) 10 mg tablet Take 1 tablet (10 mg total) by mouth daily. 30 tablet 2   ??? losartan (COZAAR) 100 MG tablet Take 100 mg by mouth daily.     ??? NIFEdipine (ADALAT CC) 60 MG 24 hr tablet Take 60 mg by mouth daily.     ??? oxyCODONE (OXY-IR) 5 mg capsule Take 2.5 mg by mouth every six (6) hours as needed.     ??? pantoprazole (PROTONIX) 40 MG tablet Take 40 mg by mouth daily.     ??? sertraline (ZOLOFT) 100 MG tablet Take 100 mg by mouth daily.     ??? zanubrutinib (BRUKINSA) capsule Take 2 capsules (160 mg total) by mouth daily. Swallow whole with water. Do not open, break, or chew capsule. 60 capsule 2   ??? amLODIPine (NORVASC) 10 MG tablet Take 1 tablet by mouth daily. (Patient not taking: Reported on 12/07/2020)     ??? traMADoL (ULTRAM) 50 mg tablet Take 50 mg by mouth every six (6) hours as needed for pain. (Patient not taking: Reported on 12/07/2020)     ??? zinc oxide 20 % ointment Apply 1 application topically as needed for dry skin. Apply to rectum topically every day for pain (Patient not taking: Reported on 12/07/2020)       No current facility-administered medications for this visit.         Physical exam:  Vitals:    12/07/20 1500   BP: 164/64   Pulse: 83   Resp: 16   Temp: 37.3 ??C (99.1 ??F)   SpO2: 100%      General:  Sitting on couch with her husband in no apparent distress, thin though not overly frail appearing  HEENT:  Pupils are equal, round and reactive to light.  There is no scleral icterus and no conjunctival injection.  The oral mucosa does not demonstrate ulceration, erythema, exudate or purpura.  Nares show no bleeding.  No thyromegaly.  No jugular venous distention.    Lymph node exam:  No lymphadenopathy in the occipital, auricular, anterior/posterior cervical, submental, supraclavicular, axillary, inguinal regions.  Heart:  Regular rate and rhythm.  Normal S1 and S2 without S3 or S4.  There are no murmurs, gallops or rubs.  Lungs:  Breathing is unlabored and patient is speaking full sentences with ease.  No stridor.  Auscultation of lung fields reveals slightly diminished LLL, normal air movement without rales, rhonchi or crackles.    Gastrointestinal:  No distention or pain on palpation.  Bowel sounds are present and normal in quality.  There is no palpable hepatomegaly or splenomegaly.  No palpable masses.  Skin:  Raised, reddened maculopapular rash distributed lightly across infraorbital and buccal areas, forehead; more densely concentrated in orbital area. No petechiae or purpura.  No areas of skin breakdown.  Musculoskeletal:  There are no grossly-evident joint effusions or deformities.  Range of motion about the shoulder, elbow, hips and knees is grossly normal.  There is no pain on palpation of the spinous processes of the cervical, thoracic or lumbar vertebral bodies.  Psychiatric:  Alert and oriented to person, place, time and situation.  Range of affect is appropriate.    Neurologic:  Easily stepped up onto exam table unassisted. Speech is clear, judgement and insight intact.  Extremities:  Appear well-perfused, with radial and dorsalis pedis pulses 2+.  There is no clubbing, edema or cyanosis.    ECOG Performance Status: 2    Orders/Results:  Labs and pathology have been reviewed and pertinent results are as follows:    Lab on 12/07/2020   Component Date Value   ??? Sodium 12/07/2020 137    ??? Potassium 12/07/2020 4.2    ??? Chloride 12/07/2020 100    ??? Anion Gap 12/07/2020 10    ??? CO2 12/07/2020 27.0    ??? BUN 12/07/2020 27*   ??? Creatinine 12/07/2020 4.40*   ??? BUN/Creatinine Ratio 12/07/2020 6    ??? EGFR CKD-EPI Non-African* 12/07/2020 10*   ??? EGFR CKD-EPI African Ame* 12/07/2020 11*   ??? Glucose 12/07/2020 101    ??? Calcium 12/07/2020 8.3*   ??? Albumin 12/07/2020 3.1*   ??? Total Protein 12/07/2020 5.3*   ??? Total Bilirubin 12/07/2020 0.3    ??? AST 12/07/2020 19    ??? ALT 12/07/2020 12    ??? Alkaline Phosphatase 12/07/2020 100    ??? WBC 12/07/2020 33.7*   ??? RBC 12/07/2020 2.85*   ??? HGB 12/07/2020 8.6*   ??? HCT 12/07/2020 26.3*   ??? MCV 12/07/2020 92.2    ??? [facility] 12/07/2020 30.0    ??? MCHC 12/07/2020 32.6    ??? RDW 12/07/2020 17.3*   ??? MPV 12/07/2020 14.1*   ??? Platelet 12/07/2020 65*   ??? Neutrophils % 12/07/2020 15.2    ??? Lymphocytes % 12/07/2020 75.7    ??? Monocytes % 12/07/2020 2.0    ??? Eosinophils % 12/07/2020 0.8    ??? Basophils % 12/07/2020 0.2    ??? Absolute Neutrophils 12/07/2020 5.1    ??? Absolute Lymphocytes 12/07/2020 25.5*   ??? Absolute Monocytes 12/07/2020 0.7    ??? Absolute Eosinophils 12/07/2020  0.3    ??? Absolute Basophils 12/07/2020 0.1    ??? Large Unstained Cells 12/07/2020 6*   ??? Macrocytosis 12/07/2020 Slight*   ??? Anisocytosis 12/07/2020 Slight*   ??? 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    This electronic signature is attestation that the pathologist personally reviewed the submitted material(s) and the final diagnosis reflects that evaluation.         PET 10/04/20  FINDINGS:  HEAD, NECK and SUPRACLAVICULAR REGIONS: 1.1 cm left level 2 hypermetabolic lymph node (CT 86). No other suspicious hypermetabolic lesions or lymph nodes. Evaluation is partially limited by metallic streak artifact from dental amalgam.  ??  CHEST:  Thyroid: Grossly unremarkable.  Breasts: Grossly unremarkable.  Lungs and Pleura: No suspicious hypermetabolic nodules. No pleural effusion. 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  Mediastinum, Hila and Axillae: No suspicious hypermetabolic lymph nodes.  Cardiovascular: [Moderate coronary artery and aortic arch atherosclerotic calcifications. No pericardial effusion.  ??  ABDOMEN and PELVIS:  Liver: No suspicious hypermetabolic lesions.  Gallbladder: No discrete cholelithiasis. Trace echogenic calcified sludge near gallbladder neck.  Spleen: No suspicious hypermetabolic lesions or diffuse uptake. Splenomegaly. Subtle focus of hypoattenuation measuring approximately 3.6 x 2.4 cm with decreased metabolic activity compared to the remainder of the spleen (CT 183).  Pancreas: No suspicious hypermetabolic lesions.  GI/Peritoneum/Mesentery: No suspicious hypermetabolic lesions or diffuse uptake. Scattered locules of intraperitoneal free air, likely related to indwelling peritoneal dialysis catheter with tip coiled in the pelvis. Trace pelvic ascites.  Adrenals: No suspicious hypermetabolic lesions.  Kidneys: Approximately 2.8 x 2.1 cm soft tissue lesion in the anterior left kidney (CT 198) without hypermetabolic activity. Scattered hypoattenuating renal cystic lesions, likely simple cysts. No hydronephrosis. No radiopaque renal or ureteral calculi.  GU: Grossly unremarkable.  Adenopathy: No suspicious hypermetabolic lymph nodes.  Vasculature: Moderate to severe calcified atherosclerotic disease throughout the visualized vasculature.   ??  MUSCULOSKELETAL: No suspicious osseous or soft tissue lesions. Approximately 6.5 x 5.2 cm nonhypermetabolic hypoattenuating fluid collection in the left gluteal musculature (CT 267). Mildly increased uptake overlying the bilateral greater trochanters.  ??  IMPRESSION:  -1.1 cm left level 2 mildly hypermetabolic lymph node, concerning for involvement by known CLL. No other suspicious hypermetabolic foci. Note that indolent lymphomas such as CLL may not be very avid.  ??  -Splenomegaly, likely related to known CLL. Nonhypermetabolic hypoattenuating focus measuring up to approximately 3.6 cm in the central spleen, indeterminate.  ??  -Nonhypermetabolic 2.8 cm soft tissue lesion in the anterior left kidney. Recommend correlation with prior imaging, if available. If not, consider contrast-enhanced cross-sectional imaging for further evaluation.  ??  -Nonhypermetabolic approximately 6.5 cm hypoattenuating fluid collection the left gluteal musculature, question old hematoma/seroma.  ??  -Increased radiotracer uptake overlying the greater trochanters, query trochanteric bursitis.  ??  -Additional chronic/incidental findings as above.

## 2020-12-08 NOTE — Unmapped (Signed)
It was great to meet you today!  We are glad you are feeling more energetic and eating well. Your rash should continue to improve---continue the loratidine (Claritin) every day and use the hydrocortisone cream as needed. You can buy a hypoallergenic emollient cream at the grocery store or drug store like Eucerin or CeraVe to moisturize to reduce dryness and itching.    Thanks for bringing up the [place] Mushroom supplements--as we discussed, it's best to avoid starting anything new at this time to avoid interactions with your prescribed medications.    We'd like to see you back in two weeks to check your labs and see how you are progressing. Let us know if any new questions or concerns arise between now and our next visit.    During daytime hours (8am to 5pm) you can call [phone] and ask to speak to someone from Dr. Lisa Roca care team.  For urgent matters after hours, you can call [phone] (main hospital) and ask the operator to page the adult oncologist on call. They will return your call within one hours.

## 2020-12-12 DIAGNOSIS — B351 Tinea unguium: Secondary | ICD-10-CM | POA: Diagnosis not present

## 2020-12-12 DIAGNOSIS — I7091 Generalized atherosclerosis: Secondary | ICD-10-CM | POA: Diagnosis not present

## 2020-12-12 DIAGNOSIS — I7090 Unspecified atherosclerosis: Secondary | ICD-10-CM | POA: Diagnosis not present

## 2020-12-12 NOTE — Unmapped (Signed)
Hi,     [name] contacted the [employer] regarding the following:    - Pt approved for copay assistance and the Leukemia and Lymphoma Society is requesting diagnosis information be faxed by 12/18/20    Please contact Dlnald at [phone].    Thanks in advance,    Keturah Shavers  [facility]   [phone]

## 2020-12-13 NOTE — Unmapped (Signed)
Call Cristina Fields (Husband of Cristina Fields). PT provided fax number [phone]-to leukemia and lymphoma copay assistance program. Needs diagnosis and clinical notes faxed over so qualify for assistance.

## 2020-12-13 NOTE — Unmapped (Signed)
Yes ma'am-I faxed everything over.

## 2020-12-14 NOTE — Unmapped (Signed)
[facility] Clinical Assessment & Refill Coordination Note    Cristina Fields, [place]: January 25, 1951  Phone: There are no phone numbers on file.    All above HIPAA information was verified with patient.     Was a Nurse, learning disability used for this call? No    Specialty Medication(s):   Hematology/Oncology: Brukinsa      Current Outpatient Medications   Medication Sig Dispense Refill   ??? amLODIPine (NORVASC) 10 MG tablet Take 1 tablet by mouth daily. (Patient not taking: Reported on 12/07/2020)     ??? atorvastatin (LIPITOR) 40 MG tablet Take 40 mg by mouth daily.     ??? carvediloL (COREG) 12.5 MG tablet Take 12.5 mg by mouth Two (2) times a day.     ??? hydrALAZINE (APRESOLINE) 10 MG tablet Take 10 mg by mouth Three (3) times a day.     ??? hydrocortisone 1 % cream Apply to affected area 2 times daily 30 g 0   ??? lidocaine (XYLOCAINE) 5 % ointment Apply topically two (2) times a day as needed. Apply to rectum topically as needed for pain for up to two times a day     ??? loratadine (CLARITIN) 10 mg tablet Take 1 tablet (10 mg total) by mouth daily. 30 tablet 2   ??? losartan (COZAAR) 100 MG tablet Take 100 mg by mouth daily.     ??? NIFEdipine (ADALAT CC) 60 MG 24 hr tablet Take 60 mg by mouth daily.     ??? oxyCODONE (OXY-IR) 5 mg capsule Take 2.5 mg by mouth every six (6) hours as needed.     ??? pantoprazole (PROTONIX) 40 MG tablet Take 40 mg by mouth daily.     ??? sertraline (ZOLOFT) 100 MG tablet Take 100 mg by mouth daily.     ??? traMADoL (ULTRAM) 50 mg tablet Take 50 mg by mouth every six (6) hours as needed for pain. (Patient not taking: Reported on 12/07/2020)     ??? zanubrutinib (BRUKINSA) capsule Take 2 capsules (160 mg total) by mouth daily. Swallow whole with water. Do not open, break, or chew capsule. 60 capsule 2   ??? zinc oxide 20 % ointment Apply 1 application topically as needed for dry skin. Apply to rectum topically every day for pain (Patient not taking: Reported on 12/07/2020)       No current facility-administered medications for this visit.        Changes to medications: Kiyani reports no changes at this time.    Allergies   Allergen Reactions   ??? Lisinopril Cough and Other (See Comments)       Changes to allergies: No    SPECIALTY MEDICATION ADHERENCE     Brukinsa 80 mg: ~10 (pt in rehab center and nurses station has meds) days of medicine on hand       Medication Adherence    Patient reported X missed doses in the last month: 0  Specialty Medication: Brukinsa 160 mg  Patient is on additional specialty medications: No  Informant: patient  Confirmed plan for next specialty medication refill: delivery by pharmacy  Refills needed for supportive medications: not needed          Specialty medication(s) dose(s) confirmed: Regimen is correct and unchanged.     Are there any concerns with adherence? No    Adherence counseling provided? Not needed    CLINICAL MANAGEMENT AND INTERVENTION      Clinical Benefit Assessment:    Do you  feel the medicine is effective or helping your condition? Yes    Clinical Benefit counseling provided? Not needed    Adverse Effects Assessment:    Are you experiencing any side effects? No    Are you experiencing difficulty administering your medicine? No    Quality of Life Assessment:    How many days over the past month did your condition  keep you from your normal activities? For example, brushing your teeth or getting up in the morning. 0    Have you discussed this with your provider? Not needed    Therapy Appropriateness:    Is therapy appropriate? Yes, therapy is appropriate and should be continued    DISEASE/MEDICATION-SPECIFIC INFORMATION      N/A    PATIENT SPECIFIC NEEDS     - Does the patient have any physical, cognitive, or cultural barriers? No    - Is the patient high risk? Yes, patient is taking oral chemotherapy. Appropriateness of therapy as been assessed    - Does the patient require a Care Management Plan? No     - Does the patient require physician intervention or other additional services (i.e. nutrition, smoking cessation, social work)? No      SHIPPING     Specialty Medication(s) to be Shipped:   Hematology/Oncology: Brukinsa    Other medication(s) to be shipped: No additional medications requested for fill at this time     Changes to insurance: No    Delivery Scheduled: Yes, Expected medication delivery date: 12/22/20.     Medication will be delivered via UPS to the confirmed prescription address in [facility].    The patient will receive a drug information handout for each medication shipped and additional FDA Medication Guides as required.  Verified that patient has previously received a Conservation officer, historic buildings.    All of the patient's questions and concerns have been addressed.    Jansel Vonstein Vangie Bicker   [facility] Specialty Pharmacist

## 2020-12-19 NOTE — Unmapped (Signed)
[facility] Pharmacist Intervention    Type of intervention: Medication side effect    Medication: Brukinsa 80 mg caps - 2 caps daily.    Problem: Cristina Fields is experiencing bumps (about 1/4 inch or larger in size) on her left leg. They are not bothersome but are visible on the leg and she noticed them about 4 days ago.      Intervention: She denies itching, redness, or fever and the bumps are on the left leg only.  She has been on Brukinsa for about 1 month.  She is currently not at the nursing home facility but will return later today.  Husband was advised to have nurse at nursing facility take a look as soon as patient returns.  If condition worsens or becomes painful/itchy/spreads or she develops a fever then have PCP or provider examine or go to emergency room.  She has a f/u scheduled with Langley Gauss on 12/26/20.    Follow up needed: Not at this time.  Forwarding msg to clinic if further action is warranted.    Approximate time spent: 10 minutes    Joselyne Spake [facility] Specialty Pharmacist

## 2020-12-20 NOTE — Unmapped (Signed)
ONN Pool Request by Langley Gauss AGNP-BC on 12/19/2020 to contact patient's husband with message that if needed, the left leg rash/bumps could be treated with the same hydrocortisone cream patient had used prior on facial rash should patient develop bothersome itching.    Actions:   1. I reached patient's spouse directly and conveyed Sean's message, reminded him of upcoming appointment with Gregary Signs, and re-iterated advice to him from Kermit Balo, [facility] given to him on 12/19/2020 about potential actions needed if patient's left leg rash/bumps worsen or she develops fever.  He said it is not a rash and more like bumps. He verbalized clear understanding and appreciation for the call.

## 2020-12-21 MED FILL — BRUKINSA 80 MG CAPSULE: ORAL | 30 days supply | Qty: 60 | Fill #1

## 2020-12-22 NOTE — Unmapped (Signed)
Follow Up Visit Note    Patient Name: Cristina Fields  Patient Age: 70 y.o.  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 female who presents for follow up of relapsed/refractory CLL.    Therapy: zanubrutinib 160mg  ONCE daily - chosen as safest option with dialysis and PPI use. See 1/14 note for discussion of treatment options.  Treatment indication: significant drenching night sweats, anemia and thrombocytopenia.    She has now been on zanubrutinib for 1 month. WBC/ALC have dropped significantly. Hemoglobin and platelets were unsually (for her) on the initial tests at [facility] but are now closer to her baseline. She has not needed a blood transfusion.  She is tolerating well. She developed a mild facial rash and this has been stable, not bothersome. In the past 2 weeks she noticed nodules along her legs but they are not clearly connected to her therapy and are not painful or bothersome.  Her BP is up, this may be stress related but I encouraged her to follow up with her nephrologists regarding BP.  She is trying to get home from SNF but this is delayed until the local dialysis center can assume her care. This delay is quite stressful for her.    Provided patient with flyer for [id number]       Plans and Recommendations:  1. CLL  - continue zanubritunib ONCE daily  - RTC with labs in 4 weeks    2. Anemia/thrombocytopenia - multifactorial - CLL and renal failure. Required transfusions prior to treatment  - monitor    3. Rash, likely drug-related: facial rash developed 1 week after start of zanubrutinib  - improving  - continue hydrocortisone cream PRN    4. HTN: recommended she follow up with her nephrologist    5. Skin nodules - subcutaneous nodules in bilateral legs, primarily noted early March  -unclear etiology - will monitor    6. Health maintenance  - vaccines:  Has gotten 3 covid vaccines and seasonal flu vaccine  - Received Evusheld 11/04/2020    Due to this patient's diagnosis, she is at significant risk for subsequent morbidity and/or mortality.    These services include the following elements of medical decision making:  addressing a hematologic cancer that poses a threat to life and/or bone marrow function  interpretation of diagnostic test reports or ordering of diagnostic tests and independent interpretation of diagnostic tests    I personally spent 40 minutes face-to-face and non-face-to-face in the care of this patient, which includes all pre, intra, and post visit time on the date of service.      Langley Gauss, AGPCNP-BC  Nurse Practitioner  Hematology/Oncology  [facility]    Dr. Lonni Fix was available.    History of Present Illness:     Cristina Fields is a 70 y.o. female who is seen in consultation at the request of Lavera Guise Jack Quarto, MD for follow up of CLL/SLL.    Interim History  Since her last visit, she reports doing well overall.    Her primary concern is that she can't get discharged from the SNF yet. She has a roommate who is awake and disruptive and night so the patient is not sleeping well. She reports that she can't be discharged until the dialysis center closer to her home accepts her. She doesn't know when this will happen.  Her rash on her face persists. She doesn't know where the hydrocortisone cream is located and hasn't been using it.    She noticed some bumps in her legs. She first noticed them in the past week or so. They are painful.    ??Denies fever/chills, bleeding/bruising and lymphadenopathy, GI upset, changes in bowel/bladder elimination.      REVIEW OF SYSTEMS:   CONSTITUTIONAL: No fevers, chills, + weight loss,+ drenching night sweats.   HEENT: Eyes: No blurred vision. ENT: No earache, sore throat or runny nose.   CARDIOVASCULAR: No chest pain, palpitations.  RESPIRATORY: No cough, shortness of breath, PND or orthopnea.   GASTROINTESTINAL: No nausea, vomiting or diarrhea.   GENITOURINARY: No dysuria, frequency or urgency.   MUSCULOSKELETAL: No muscle pain or weakness.  SKIN: No rashes or other skin complaints.  NEUROLOGIC: No headaches, paresthesias, fasciculations, seizures.  PSYCHIATRIC: No disorder of thought or mood.   ENDOCRINE: No heat or cold intolerance, polyuria or polydipsia.   HEMATOLOGICAL: No easy bruising or bleeding.      Oncology History:    Oncology History Overview Note   Prior therapy:  FCR x6 at [facility] beginning in 06/2011 for rapid LDT, attained a CR     Relapse 05/2014     09/22/2015 bendamustine (90 mg/m2) began (no Ritux first cycle)  10/20/2015: BR, severe infusion reaction prompting MICU stay  11/17/15 BR, benda reduced to 70 mg/m2, ritux tolerated  01/2016 - 6th cycle BR completed   03/2016 PET consistent with response     Relapse in 2018     02/20/18 ibrutinib 140 mg daily started (dose rec by pharmacy due to CKD)     04/11/20 ibrutinib held when pt admitted for acute cerebellar infarct     04/26/20 admitted for ritux re-initiation with plan for 4 weeks in setting of rapid relapse (WBC up to 88K, PET not concerning for transformation)     05/10/20 chlorambucil started      07/12/20 cycle 3 ritux - cycle 4 declined     CLL (chronic lymphoid leukemia) in relapse (CMS-HCC)   2012 Initial Diagnosis    CLL (chronic lymphoid leukemia) in relapse (CMS-HCC)     08/04/2019 Biopsy    (Outside case: [id number], dated 08/04/2019)  Bone marrow, aspiration and biopsy  -  Normocellular bone marrow (30%) involved by chronic lymphocytic leukemia / small lymphocytic lymphoma, representing 40% of marrow cellularity by outside PAX5 immunohistochemistry       -  By outside report, cytogenetic studies were performed and interpreted as below:  Karyotype (per [facility] report): 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     09/21/2020 Biopsy (Outside case: [id number], dated 09/21/2020)  Lymph node, left cervical, image-guided needle core biopsy  -  Chronic lymphocytic leukemia/small lymphocytic lymphoma (see Comment)  -  Negative for large cell transformation in the sampled core.         11/24/2020 -  Chemotherapy    Zanubrutinib 160 mg ONCE daily         No past medical history on file.   No past surgical history on file.     No family history on file.    Social History     Tobacco Use   ??? Smoking status: Not on file   ??? Smokeless tobacco: Not on file   Substance Use Topics   ??? Alcohol use: Not  on file   ??? Drug use: Not on file         Allergies   Allergen Reactions   ??? Lisinopril Cough and Other (See Comments)         Current Outpatient Medications   Medication Sig Dispense Refill   ??? amLODIPine (NORVASC) 10 MG tablet Take 1 tablet by mouth daily.      ??? atorvastatin (LIPITOR) 40 MG tablet Take 40 mg by mouth daily.     ??? carvediloL (COREG) 12.5 MG tablet Take 12.5 mg by mouth Two (2) times a day.     ??? hydrALAZINE (APRESOLINE) 10 MG tablet Take 10 mg by mouth Three (3) times a day.     ??? hydrocortisone 1 % cream Apply to affected area 2 times daily 30 g 0   ??? lidocaine (XYLOCAINE) 5 % ointment Apply topically two (2) times a day as needed. Apply to rectum topically as needed for pain for up to two times a day     ??? loratadine (CLARITIN) 10 mg tablet Take 1 tablet (10 mg total) by mouth daily. 30 tablet 2   ??? losartan (COZAAR) 100 MG tablet Take 100 mg by mouth daily.     ??? NIFEdipine (ADALAT CC) 60 MG 24 hr tablet Take 60 mg by mouth daily.     ??? oxyCODONE (OXY-IR) 5 mg capsule Take 2.5 mg by mouth every six (6) hours as needed.     ??? pantoprazole (PROTONIX) 40 MG tablet Take 40 mg by mouth daily.     ??? sertraline (ZOLOFT) 100 MG tablet Take 100 mg by mouth daily.     ??? traMADoL (ULTRAM) 50 mg tablet Take 50 mg by mouth every six (6) hours as needed for pain.      ??? zanubrutinib (BRUKINSA) capsule Take 2 capsules (160 mg total) by mouth daily. Swallow whole with water. Do not open, break, or chew capsule. 60 capsule 5   ??? zinc oxide 20 % ointment Apply 1 application topically as needed for dry skin. Apply to rectum topically every day for pain        No current facility-administered medications for this visit.         Physical exam:  Vitals:    12/26/20 1429   BP: (S) 194/78   Pulse: 80   Resp: 18   Temp: 35.9 ??C (96.7 ??F)   SpO2: 100%      General: Thin woman Resting in no apparent distress, accompanied by husband  HEENT:  Clear sclera, conjunctiva, mask in place  LYMPH:  no palpable cervical, supraclavicular, axillary, or inguinal nodes  CARDAC: RRR, no R,M,Gs, no pitting peripheral edema  RESP: nonlabored, bilaterally CTA  GI: Soft, nontender, active bowel sounds, no hepatic or splenomegaly  NEURO: alert, 0x4, steady gait, no focal deficits  PSYCH: appropriate  DERM: Raised, reddened maculopapular rash distributed lightly across infraorbital and buccal areas, forehead; more densely concentrated in orbital area. Sucutaneous nodules scattered in legs (mostly below the knee)  LINE: none      ECOG Performance Status: 2    Orders/Results:  Labs and pathology have been reviewed and pertinent results are as follows:    Lab on 12/26/2020   Component Date Value   ??? Sodium 12/26/2020 141    ??? Potassium 12/26/2020 4.8    ??? Chloride 12/26/2020 104    ??? Anion Gap 12/26/2020 13    ??? CO2 12/26/2020 24.0    ??? BUN 12/26/2020 39 (A)   ???  Creatinine 12/26/2020 6.72 (A)   ??? BUN/Creatinine Ratio 12/26/2020 6    ??? EGFR CKD-EPI Non-African* 12/26/2020 6 (A)   ??? EGFR CKD-EPI African Ame* 12/26/2020 7 (A)   ??? Glucose 12/26/2020 82    ??? Calcium 12/26/2020 8.7    ??? Albumin 12/26/2020 3.4    ??? Total Protein 12/26/2020 5.6 (A)   ??? Total Bilirubin 12/26/2020 0.2 (A)   ??? AST 12/26/2020 18    ??? ALT 12/26/2020 14    ??? Alkaline Phosphatase 12/26/2020 79    ??? WBC 12/26/2020 8.0    ??? RBC 12/26/2020 2.65 (A)   ??? HGB 12/26/2020 8.1 (A)   ??? HCT 12/26/2020 24.2 (A)   ??? MCV 12/26/2020 91.4    ??? MCH 12/26/2020 30.5    ??? MCHC 12/26/2020 33.4    ??? RDW 12/26/2020 17.3 (A)   ??? MPV 12/26/2020 10.0    ??? Platelet 12/26/2020 53 (A)   ??? Neutrophils % 12/26/2020 19.4    ??? Lymphocytes % 12/26/2020 72.8    ??? Monocytes % 12/26/2020 5.6    ??? Eosinophils % 12/26/2020 1.6    ??? Basophils % 12/26/2020 0.6    ??? Absolute Neutrophils 12/26/2020 1.6 (A)   ??? Absolute Lymphocytes 12/26/2020 5.8 (A)   ??? Absolute Monocytes 12/26/2020 0.4    ??? Absolute Eosinophils 12/26/2020 0.1    ??? Absolute Basophils 12/26/2020 0.0    ??? Anisocytosis 12/26/2020 Slight (A)   ??? 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    This electronic signature is attestation that the pathologist personally reviewed the submitted material(s) and the final diagnosis reflects that evaluation.         PET 10/04/20  FINDINGS:  HEAD, NECK and SUPRACLAVICULAR REGIONS: 1.1 cm left level 2 hypermetabolic lymph node (CT 86). No other suspicious hypermetabolic lesions or lymph nodes. Evaluation is partially limited by metallic streak artifact from dental amalgam.  ??  CHEST:  Thyroid: Grossly unremarkable.  Breasts: Grossly unremarkable.  Lungs and Pleura: No suspicious hypermetabolic nodules. No pleural effusion. 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  Mediastinum, Hila and Axillae: No suspicious hypermetabolic lymph nodes.  Cardiovascular: [Moderate coronary artery and aortic arch atherosclerotic calcifications. No pericardial effusion.  ??  ABDOMEN and PELVIS:  Liver: No suspicious hypermetabolic lesions.  Gallbladder: No discrete cholelithiasis. Trace echogenic calcified sludge near gallbladder neck.  Spleen: No suspicious hypermetabolic lesions or diffuse uptake. Splenomegaly. Subtle focus of hypoattenuation measuring approximately 3.6 x 2.4 cm with decreased metabolic activity compared to the remainder of the spleen (CT 183).  Pancreas: No suspicious hypermetabolic lesions.  GI/Peritoneum/Mesentery: No suspicious hypermetabolic lesions or diffuse uptake. Scattered locules of intraperitoneal free air, likely related to indwelling peritoneal dialysis catheter with tip coiled in the pelvis. Trace pelvic ascites.  Adrenals: No suspicious hypermetabolic lesions.  Kidneys: Approximately 2.8 x 2.1 cm soft  tissue lesion in the anterior left kidney (CT 198) without hypermetabolic activity. Scattered hypoattenuating renal cystic lesions, likely simple cysts. No hydronephrosis. No radiopaque renal or ureteral calculi.  GU: Grossly unremarkable.  Adenopathy: No suspicious hypermetabolic lymph nodes.  Vasculature: Moderate to severe calcified atherosclerotic disease throughout the visualized vasculature.   ??  MUSCULOSKELETAL: No suspicious osseous or soft tissue lesions. Approximately 6.5 x 5.2 cm nonhypermetabolic hypoattenuating fluid collection in the left gluteal musculature (CT 267). Mildly increased uptake overlying the bilateral greater trochanters.  ??  IMPRESSION:  -1.1 cm left level 2 mildly hypermetabolic lymph node, concerning for involvement by known CLL. No other suspicious hypermetabolic foci. Note that indolent lymphomas such as CLL may not be very avid.  ??  -Splenomegaly, likely related to known CLL. Nonhypermetabolic hypoattenuating focus measuring up to approximately 3.6 cm in the central spleen, indeterminate.  ??  -Nonhypermetabolic 2.8 cm soft tissue lesion in the anterior left kidney. Recommend correlation with prior imaging, if available. If not, consider contrast-enhanced cross-sectional imaging for further evaluation.  ??  -Nonhypermetabolic approximately 6.5 cm hypoattenuating fluid collection the left gluteal musculature, question old hematoma/seroma.  ??  -Increased radiotracer uptake overlying the greater trochanters, query trochanteric bursitis.  ??  -Additional chronic/incidental findings as above.

## 2020-12-26 ENCOUNTER — Ambulatory Visit: Admit: 2020-12-26 | Discharge: 2020-12-27 | Payer: MEDICARE | Attending: Adult Health | Primary: Adult Health

## 2020-12-26 ENCOUNTER — Other Ambulatory Visit: Admit: 2020-12-26 | Discharge: 2020-12-27 | Payer: MEDICARE

## 2020-12-26 DIAGNOSIS — C9112 Chronic lymphocytic leukemia of B-cell type in relapse: Secondary | ICD-10-CM | POA: Diagnosis not present

## 2020-12-26 LAB — CBC W/ AUTO DIFF
BASOPHILS ABSOLUTE COUNT: 0 10*9/L (ref 0.0–0.1)
BASOPHILS RELATIVE PERCENT: 0.6 %
EOSINOPHILS ABSOLUTE COUNT: 0.1 10*9/L (ref 0.0–0.5)
EOSINOPHILS RELATIVE PERCENT: 1.6 %
HEMATOCRIT: 24.2 % — ABNORMAL LOW (ref 34.0–44.0)
HEMOGLOBIN: 8.1 g/dL — ABNORMAL LOW (ref 11.3–14.9)
LYMPHOCYTES ABSOLUTE COUNT: 5.8 10*9/L — ABNORMAL HIGH (ref 1.1–3.6)
LYMPHOCYTES RELATIVE PERCENT: 72.8 %
MEAN CORPUSCULAR HEMOGLOBIN CONC: 33.4 g/dL (ref 32.0–36.0)
MEAN CORPUSCULAR HEMOGLOBIN: 30.5 pg (ref 25.9–32.4)
MEAN CORPUSCULAR VOLUME: 91.4 fL (ref 77.6–95.7)
MEAN PLATELET VOLUME: 10 fL (ref 6.8–10.7)
MONOCYTES ABSOLUTE COUNT: 0.4 10*9/L (ref 0.3–0.8)
MONOCYTES RELATIVE PERCENT: 5.6 %
NEUTROPHILS ABSOLUTE COUNT: 1.6 10*9/L — ABNORMAL LOW (ref 1.8–7.8)
NEUTROPHILS RELATIVE PERCENT: 19.4 %
PLATELET COUNT: 53 10*9/L — ABNORMAL LOW (ref 150–450)
RED BLOOD CELL COUNT: 2.65 10*12/L — ABNORMAL LOW (ref 3.95–5.13)
RED CELL DISTRIBUTION WIDTH: 17.3 % — ABNORMAL HIGH (ref 12.2–15.2)
WBC ADJUSTED: 8 10*9/L (ref 3.6–11.2)

## 2020-12-26 LAB — COMPREHENSIVE METABOLIC PANEL
ALBUMIN: 3.4 g/dL (ref 3.4–5.0)
ALKALINE PHOSPHATASE: 79 U/L (ref 46–116)
ALT (SGPT): 14 U/L (ref 10–49)
ANION GAP: 13 mmol/L (ref 5–14)
AST (SGOT): 18 U/L (ref <=34)
BILIRUBIN TOTAL: 0.2 mg/dL — ABNORMAL LOW (ref 0.3–1.2)
BLOOD UREA NITROGEN: 39 mg/dL — ABNORMAL HIGH (ref 9–23)
BUN / CREAT RATIO: 6
CALCIUM: 8.7 mg/dL (ref 8.7–10.4)
CHLORIDE: 104 mmol/L (ref 98–107)
CO2: 24 mmol/L (ref 20.0–31.0)
CREATININE: 6.72 mg/dL — ABNORMAL HIGH
EGFR CKD-EPI AA FEMALE: 7 mL/min/{1.73_m2} — ABNORMAL LOW (ref >=60)
EGFR CKD-EPI NON-AA FEMALE: 6 mL/min/{1.73_m2} — ABNORMAL LOW (ref >=60)
GLUCOSE RANDOM: 82 mg/dL (ref 70–179)
POTASSIUM: 4.8 mmol/L (ref 3.4–4.8)
PROTEIN TOTAL: 5.6 g/dL — ABNORMAL LOW (ref 5.7–8.2)
SODIUM: 141 mmol/L (ref 135–145)

## 2020-12-26 LAB — SLIDE REVIEW

## 2020-12-26 MED ORDER — ZANUBRUTINIB 80 MG CAPSULE
ORAL_CAPSULE | Freq: Every day | ORAL | 5 refills | 30.00000 days | Status: CP
Start: 2020-12-26 — End: ?
  Filled 2021-01-18: qty 60, 30d supply, fill #0

## 2020-12-26 MED ORDER — HYDROCORTISONE 1 % TOPICAL CREAM: 0 refills | 0 days | Status: CP

## 2020-12-26 MED ORDER — HYDROCORTISONE 1 % TOPICAL CREAM
Freq: Two times a day (BID) | TOPICAL | 0 refills | 0.00000 days | Status: CP
Start: 2020-12-26 — End: 2021-12-26

## 2020-12-26 NOTE — Unmapped (Unsigned)
Labs drawn and sent for analysis.  Care provided by  Y Cheek.

## 2020-12-26 NOTE — Unmapped (Signed)
Please return in 1 months for a follow up visit and labs.    Please call us if you experience:    1. Fever of 100.5 F or higher, shaking chills, drenching night sweats.  2. Any rapidly enlarging lymph node or mass  3. Unintentional weight loss  4. Any other concerning symptom     MyChart Messages  For your safety and best care, please DO NOT use MyChart messages to report symptoms. (Symptoms should be reported by calling the nurse triage line). Please use MyChart for non-urgent matters such as general questions, non-urgent prescription refills, or non-urgent scheduling issues.     ?? Please do not use MyChart for URGENT messages, as messages are only checked during regular business hours.     ?? Please note that MyChart messages may be routed a central pool and one of your provider???s team members will get back to you.  - Expect up to 3 business days for response     If you have any other questions, please do not hesitate to contact us.    Nurse Navigator: Covering hematologic malignancies navigator  Nurse Practitioner: Langley Gauss    For health related questions Monday through Friday 8 AM??? 5 PM : please call the office at [phone] and ask to speak with a nurse.  For appointment changes call: Main Clinic [phone].  Toll free number is [phone].    On Nights, Weekends and Holidays:  Call [phone] and ask for the adult hematologist/oncologist on call.      [facility]  [address]  [place], [place] [address]  [email or web address]    Results for orders placed or performed in visit on 12/26/20   Comprehensive Metabolic Panel   Result Value Ref Range    Sodium 141 135 - 145 mmol/L    Potassium 4.8 3.4 - 4.8 mmol/L    Chloride 104 98 - 107 mmol/L    Anion Gap 13 5 - 14 mmol/L    CO2 24.0 20.0 - 31.0 mmol/L    BUN 39 (H) 9 - 23 mg/dL    Creatinine 2.44 (H) 0.60 - 0.80 mg/dL    BUN/Creatinine Ratio 6     EGFR CKD-EPI Non-African American, Female 6 (L) >=60 mL/min/1.57m2    EGFR CKD-EPI African American, Female 7 (L) >=60 mL/min/1.89m2    Glucose 82 70 - 179 mg/dL    Calcium 8.7 8.7 - 01.0 mg/dL    Albumin 3.4 3.4 - 5.0 g/dL    Total Protein 5.6 (L) 5.7 - 8.2 g/dL    Total Bilirubin 0.2 (L) 0.3 - 1.2 mg/dL    AST 18 <=27 U/L    ALT 14 10 - 49 U/L    Alkaline Phosphatase 79 46 - 116 U/L   CBC w/ Differential   Result Value Ref Range    WBC 8.0 3.6 - 11.2 10*9/L    RBC 2.65 (L) 3.95 - 5.13 10*12/L    HGB 8.1 (L) 11.3 - 14.9 g/dL    HCT 25.3 (L) 66.4 - 44.0 %    MCV 91.4 77.6 - 95.7 fL    MCH 30.5 25.9 - 32.4 pg    MCHC 33.4 32.0 - 36.0 g/dL    RDW 40.3 (H) 47.4 - 15.2 %    MPV 10.0 6.8 - 10.7 fL    Platelet 53 (L) 150 - 450 10*9/L    Neutrophils % 19.4 %    Lymphocytes % 72.8 %    Monocytes % 5.6 %  Eosinophils % 1.6 %    Basophils % 0.6 %    Absolute Neutrophils 1.6 (L) 1.8 - 7.8 10*9/L    Absolute Lymphocytes 5.8 (H) 1.1 - 3.6 10*9/L    Absolute Monocytes 0.4 0.3 - 0.8 10*9/L    Absolute Eosinophils 0.1 0.0 - 0.5 10*9/L    Absolute Basophils 0.0 0.0 - 0.1 10*9/L    Anisocytosis Slight (A) Not Present

## 2020-12-28 MED ORDER — SEVELAMER CARBONATE 800 MG TABLET
ORAL | 0 days
Start: 2020-12-28 — End: 2021-01-25

## 2020-12-28 MED ORDER — ONDANSETRON HCL 8 MG TABLET
Freq: Three times a day (TID) | ORAL | 0.00000 days
Start: 2020-12-28 — End: ?

## 2020-12-28 MED ORDER — ASPIRIN 81 MG TABLET,DELAYED RELEASE
ORAL | 0 days
Start: 2020-12-28 — End: ?

## 2020-12-28 MED ORDER — APIXABAN 5 MG TABLET
ORAL | 0 days
Start: 2020-12-28 — End: ?

## 2020-12-28 NOTE — Unmapped (Signed)
[facility] contacted the Communication Center requesting to speak with the care team of Cristina Fields to discuss:    Patient taking brukinsa and aspirin. Wanted to make sure there isn't a prescription interaction/that aspirin should be discontinued.    Please contact Erica at [phone].    Thank you,   Kelli Hope  [facility]   [phone]

## 2020-12-28 NOTE — Unmapped (Signed)
I spoke with Alcario Drought at the [facility] [place]. Ms. Rennert has been questioning if she should be on an aspirin 81 mg daily and has been refusing it some days. This is due to previous education we've provided her about medications to avoid while on zanu. This medication appears on their [facility] but not on her med list with [place]. 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 I will add to med list. If we determine she should stop the aspirin, we can call Erica back at [phone]. She can continue zanu 160 mg ONCE daily in setting of HD, with option to increase to BID if tolerating in the future.      Manfred Arch, PharmD, BCOP, CPP  Pager: [phone]

## 2020-12-29 MED ORDER — UNABLE TO FIND
0 days
Start: 2020-12-29 — End: ?

## 2021-01-03 MED ORDER — MIRCERA INJ
0 days
Start: 2021-01-03 — End: 2022-01-02

## 2021-01-06 ENCOUNTER — Ambulatory Visit (INDEPENDENT_AMBULATORY_CARE_PROVIDER_SITE_OTHER): Payer: Medicare Other | Admitting: Internal Medicine

## 2021-01-06 ENCOUNTER — Ambulatory Visit (INDEPENDENT_AMBULATORY_CARE_PROVIDER_SITE_OTHER)
Admission: RE | Admit: 2021-01-06 | Discharge: 2021-01-06 | Disposition: A | Discharge status: Home / Self Care | Payer: Medicare Other | Source: Ambulatory Visit | Attending: Internal Medicine | Admitting: Internal Medicine

## 2021-01-06 ENCOUNTER — Encounter: Payer: Self-pay | Admitting: Internal Medicine

## 2021-01-06 ENCOUNTER — Other Ambulatory Visit: Payer: Self-pay

## 2021-01-06 VITALS — BP 110/64 | HR 78 | Temp 97.9°F | Wt 110.0 lb

## 2021-01-06 DIAGNOSIS — N1831 Chronic kidney disease, stage 3a: Secondary | ICD-10-CM

## 2021-01-06 DIAGNOSIS — M25521 Pain in right elbow: Secondary | ICD-10-CM | POA: Diagnosis not present

## 2021-01-06 DIAGNOSIS — N185 Chronic kidney disease, stage 5: Secondary | ICD-10-CM

## 2021-01-06 DIAGNOSIS — I10 Essential (primary) hypertension: Secondary | ICD-10-CM | POA: Diagnosis not present

## 2021-01-06 DIAGNOSIS — E119 Type 2 diabetes mellitus without complications: Secondary | ICD-10-CM

## 2021-01-06 DIAGNOSIS — C9110 Chronic lymphocytic leukemia of B-cell type not having achieved remission: Secondary | ICD-10-CM

## 2021-01-06 DIAGNOSIS — E7800 Pure hypercholesterolemia, unspecified: Secondary | ICD-10-CM | POA: Diagnosis not present

## 2021-01-06 DIAGNOSIS — A630 Anogenital (venereal) warts: Secondary | ICD-10-CM | POA: Insufficient documentation

## 2021-01-06 DIAGNOSIS — D509 Iron deficiency anemia, unspecified: Secondary | ICD-10-CM | POA: Insufficient documentation

## 2021-01-06 DIAGNOSIS — M25511 Pain in right shoulder: Secondary | ICD-10-CM

## 2021-01-06 DIAGNOSIS — Z1159 Encounter for screening for other viral diseases: Secondary | ICD-10-CM | POA: Diagnosis not present

## 2021-01-06 DIAGNOSIS — I120 Hypertensive chronic kidney disease with stage 5 chronic kidney disease or end stage renal disease: Secondary | ICD-10-CM | POA: Diagnosis not present

## 2021-01-06 DIAGNOSIS — D500 Iron deficiency anemia secondary to blood loss (chronic): Secondary | ICD-10-CM

## 2021-01-06 IMAGING — DX DG SHOULDER 2+V*R*
3 series · 3 of 3 positions shown · non-contrast
Comparison: None.

CLINICAL DATA: Pain status post fall

EXAM:
RIGHT SHOULDER - 2+ VIEW

[shoulder axial]
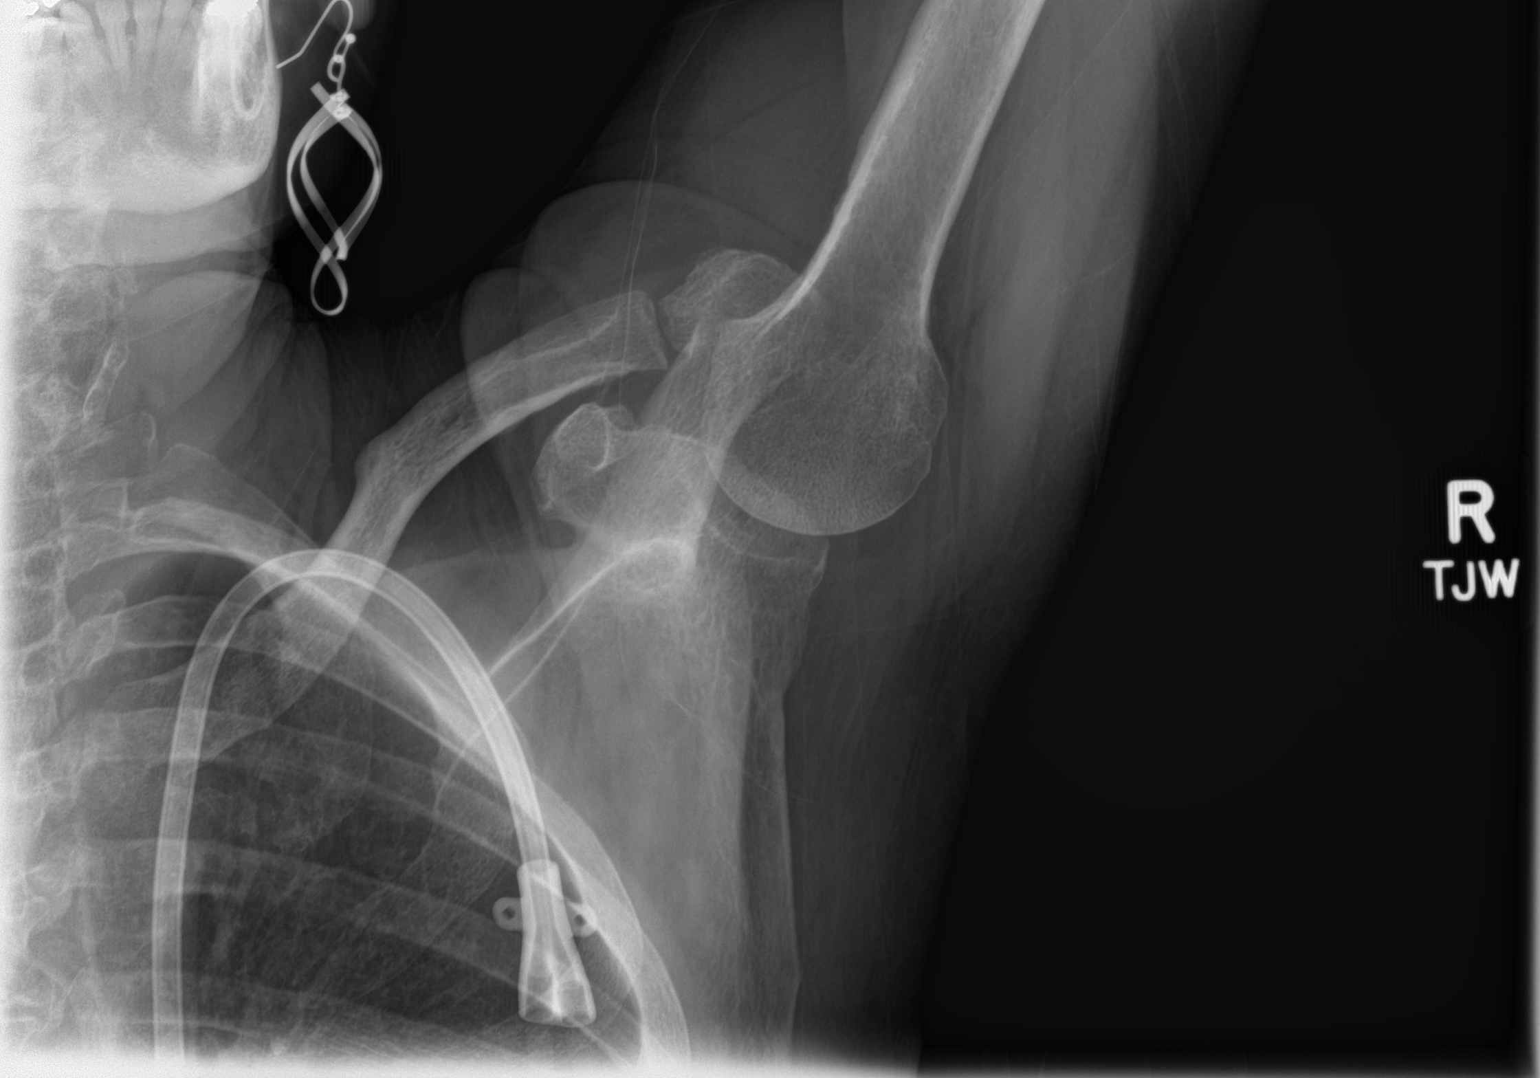

[shoulder ap]
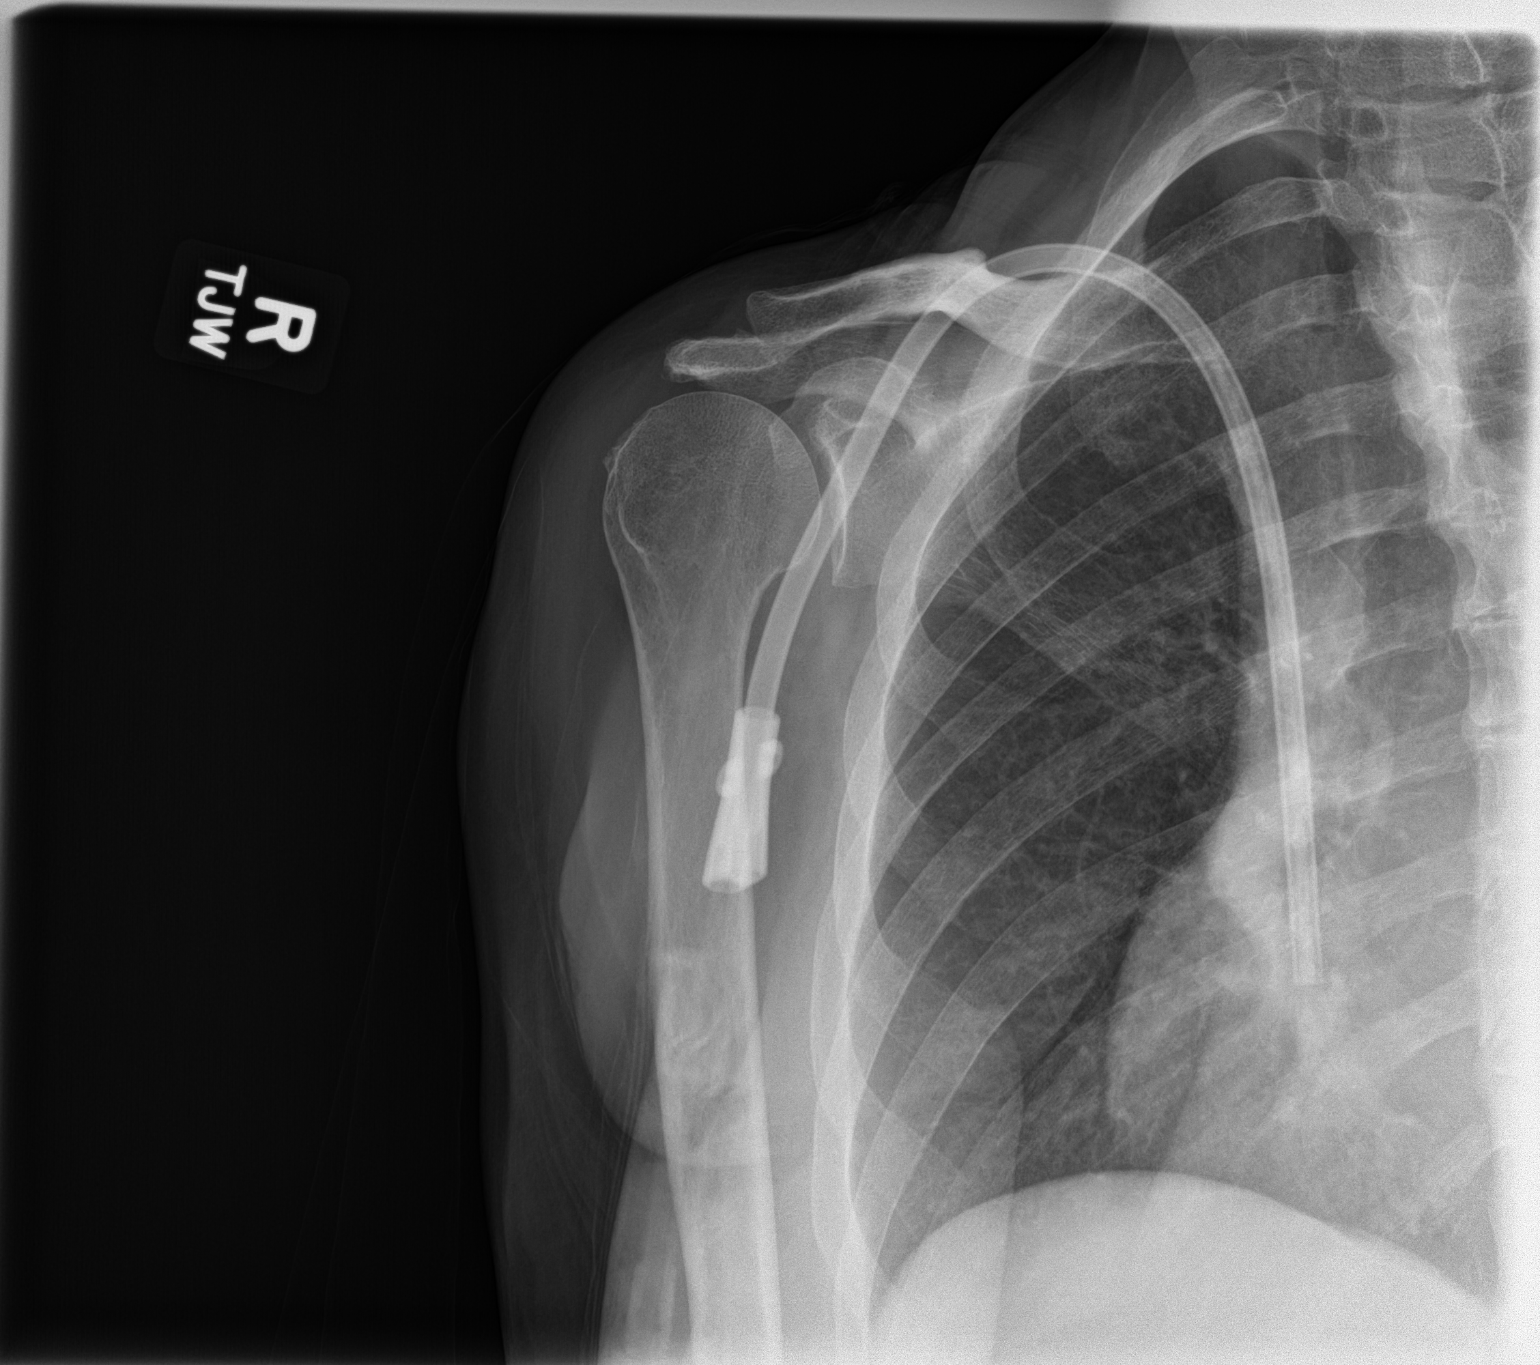

[shoulder y-view]
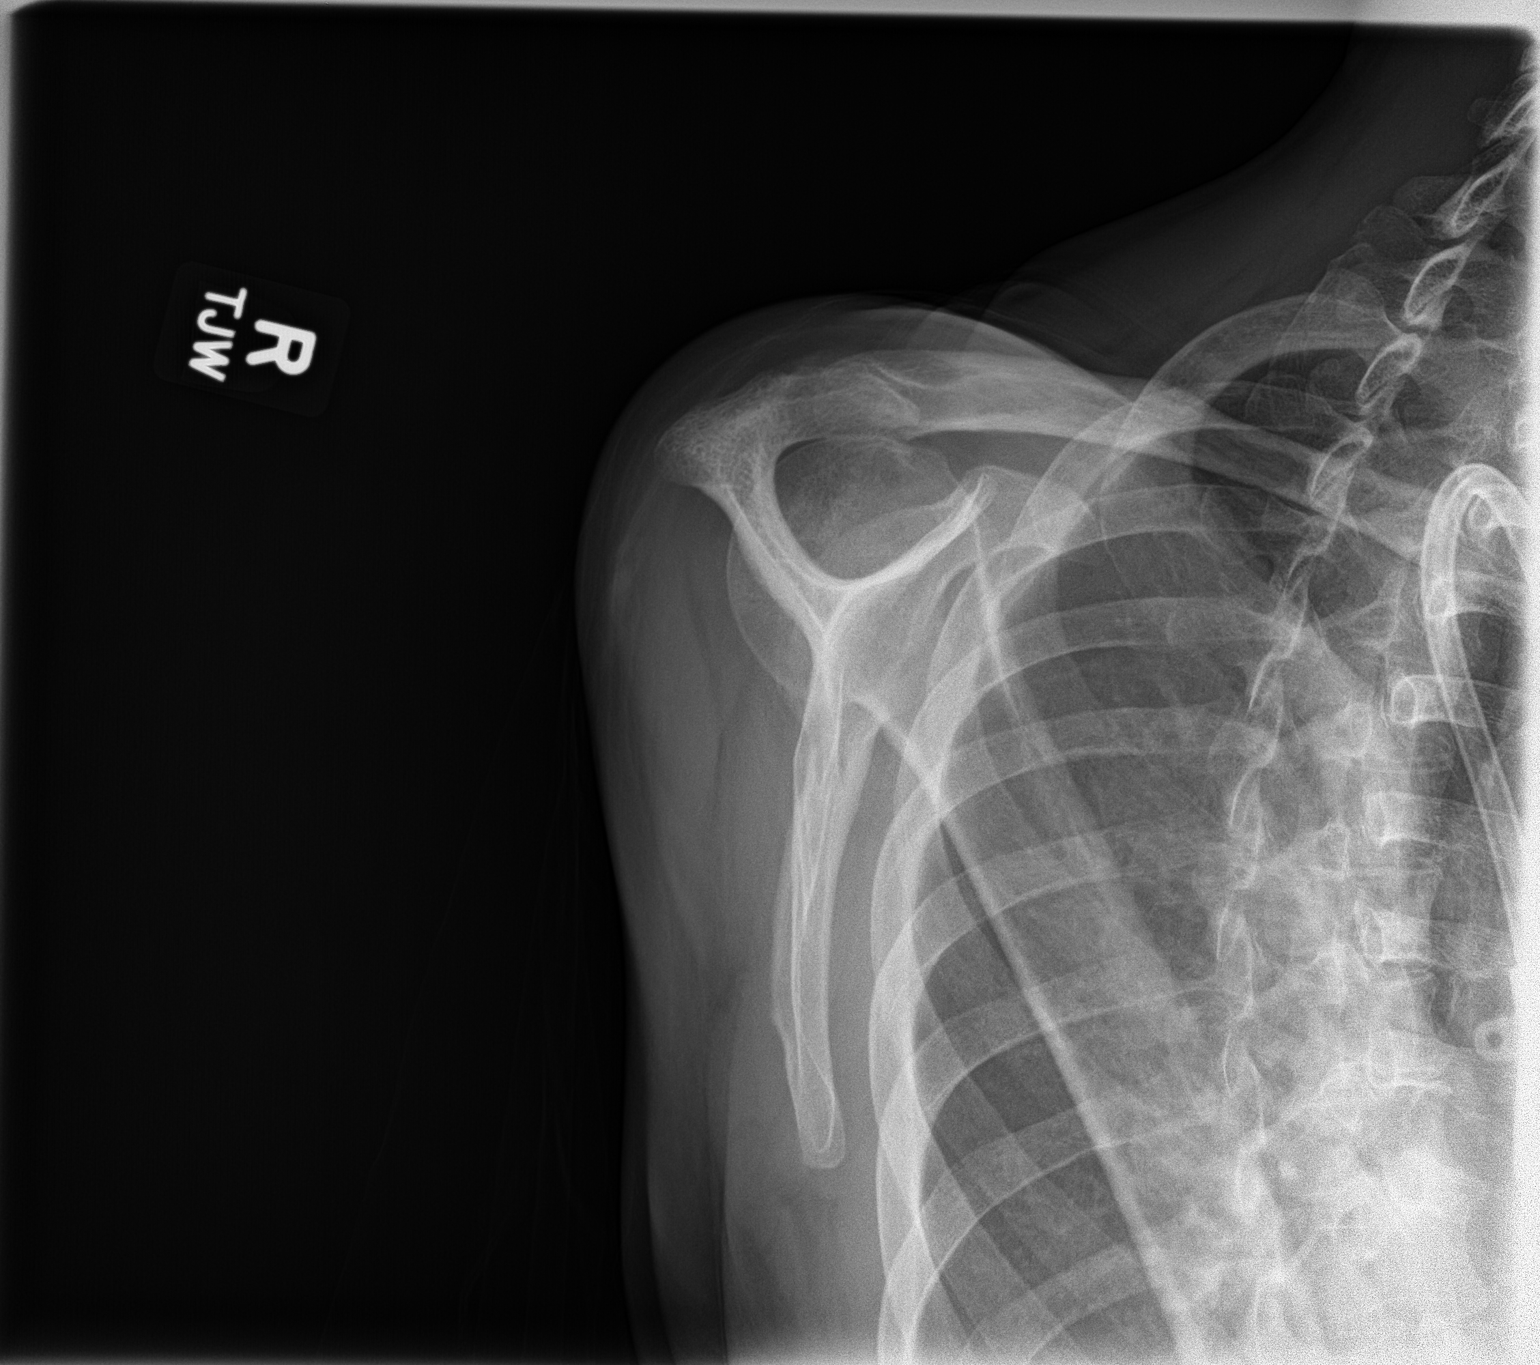

[3 of 3 positions shown; findings below may reference images not displayed]

FINDINGS: There is no evidence of fracture or dislocation. There is no
evidence of arthropathy or other focal bone abnormality. Soft
tissues are unremarkable. The dialysis catheter is noted.
IMPRESSION: Negative.

## 2021-01-06 IMAGING — DX DG ELBOW COMPLETE 3+V*R*
3 series · 3 of 3 positions shown · non-contrast
Comparison: None.

CLINICAL DATA: Pain status post fall

EXAM:
RIGHT ELBOW - COMPLETE 3+ VIEW

[elbow ap]
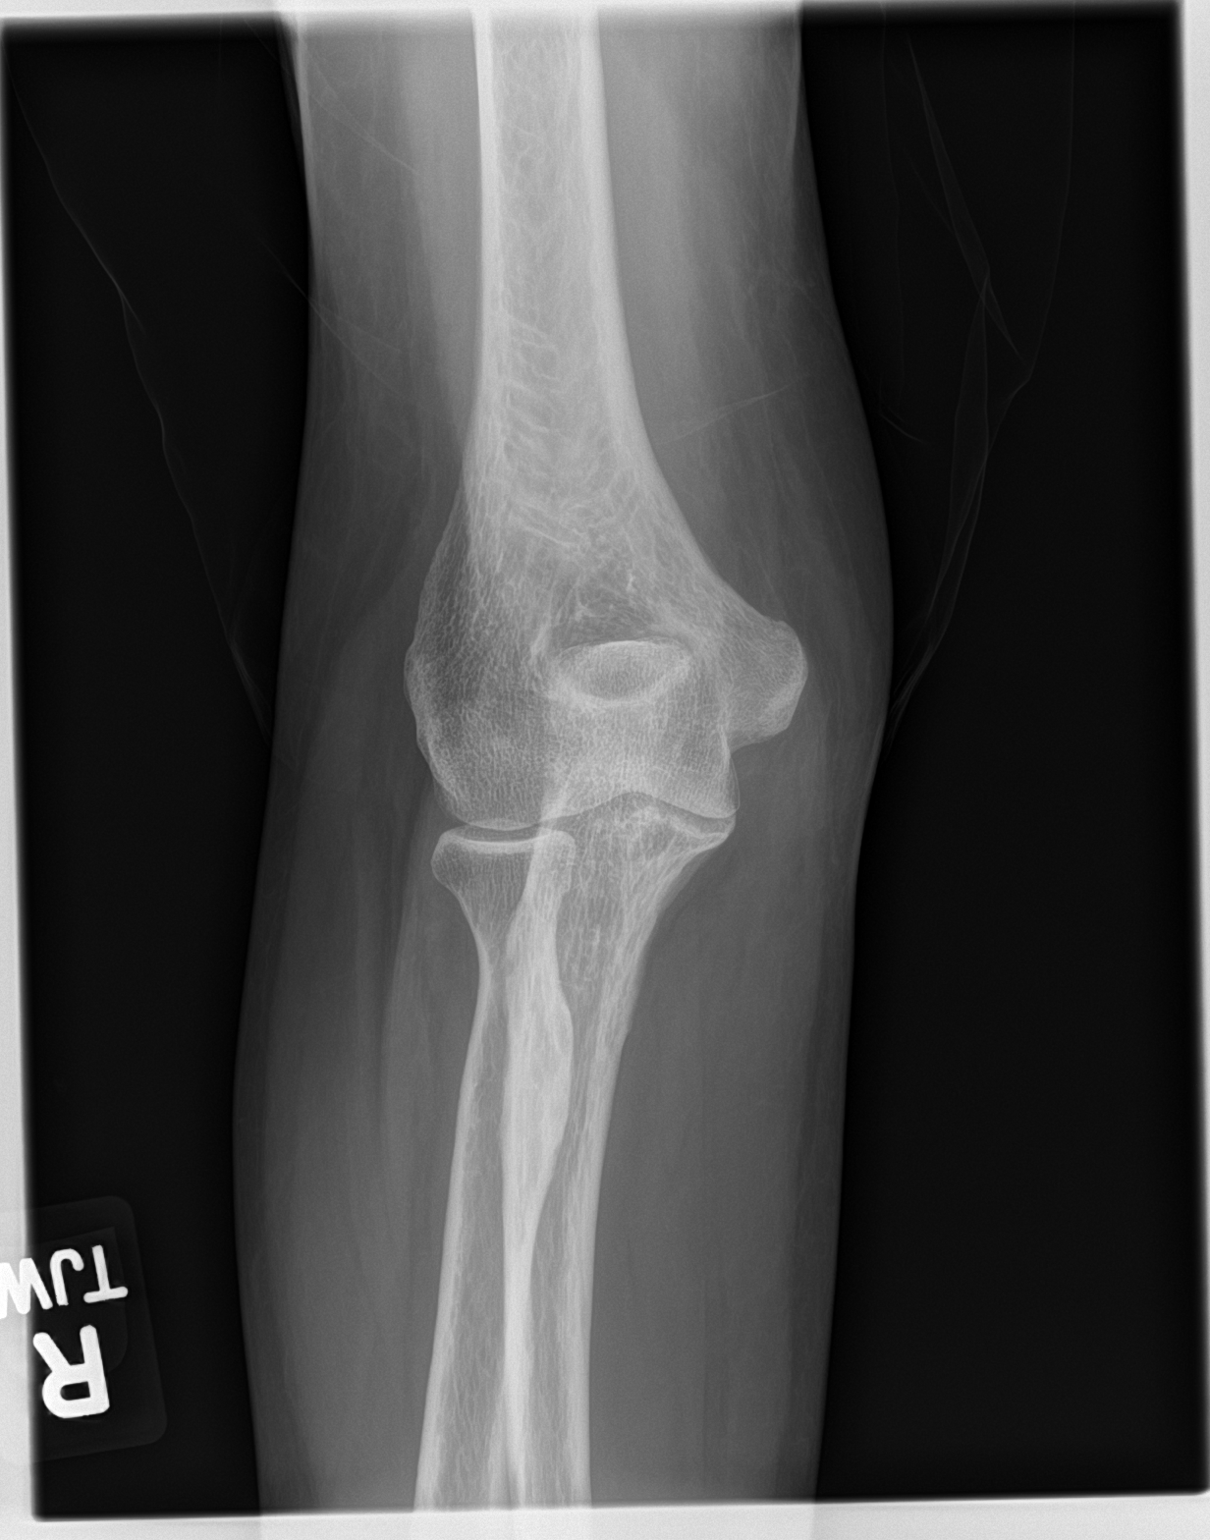

[elbow obl]
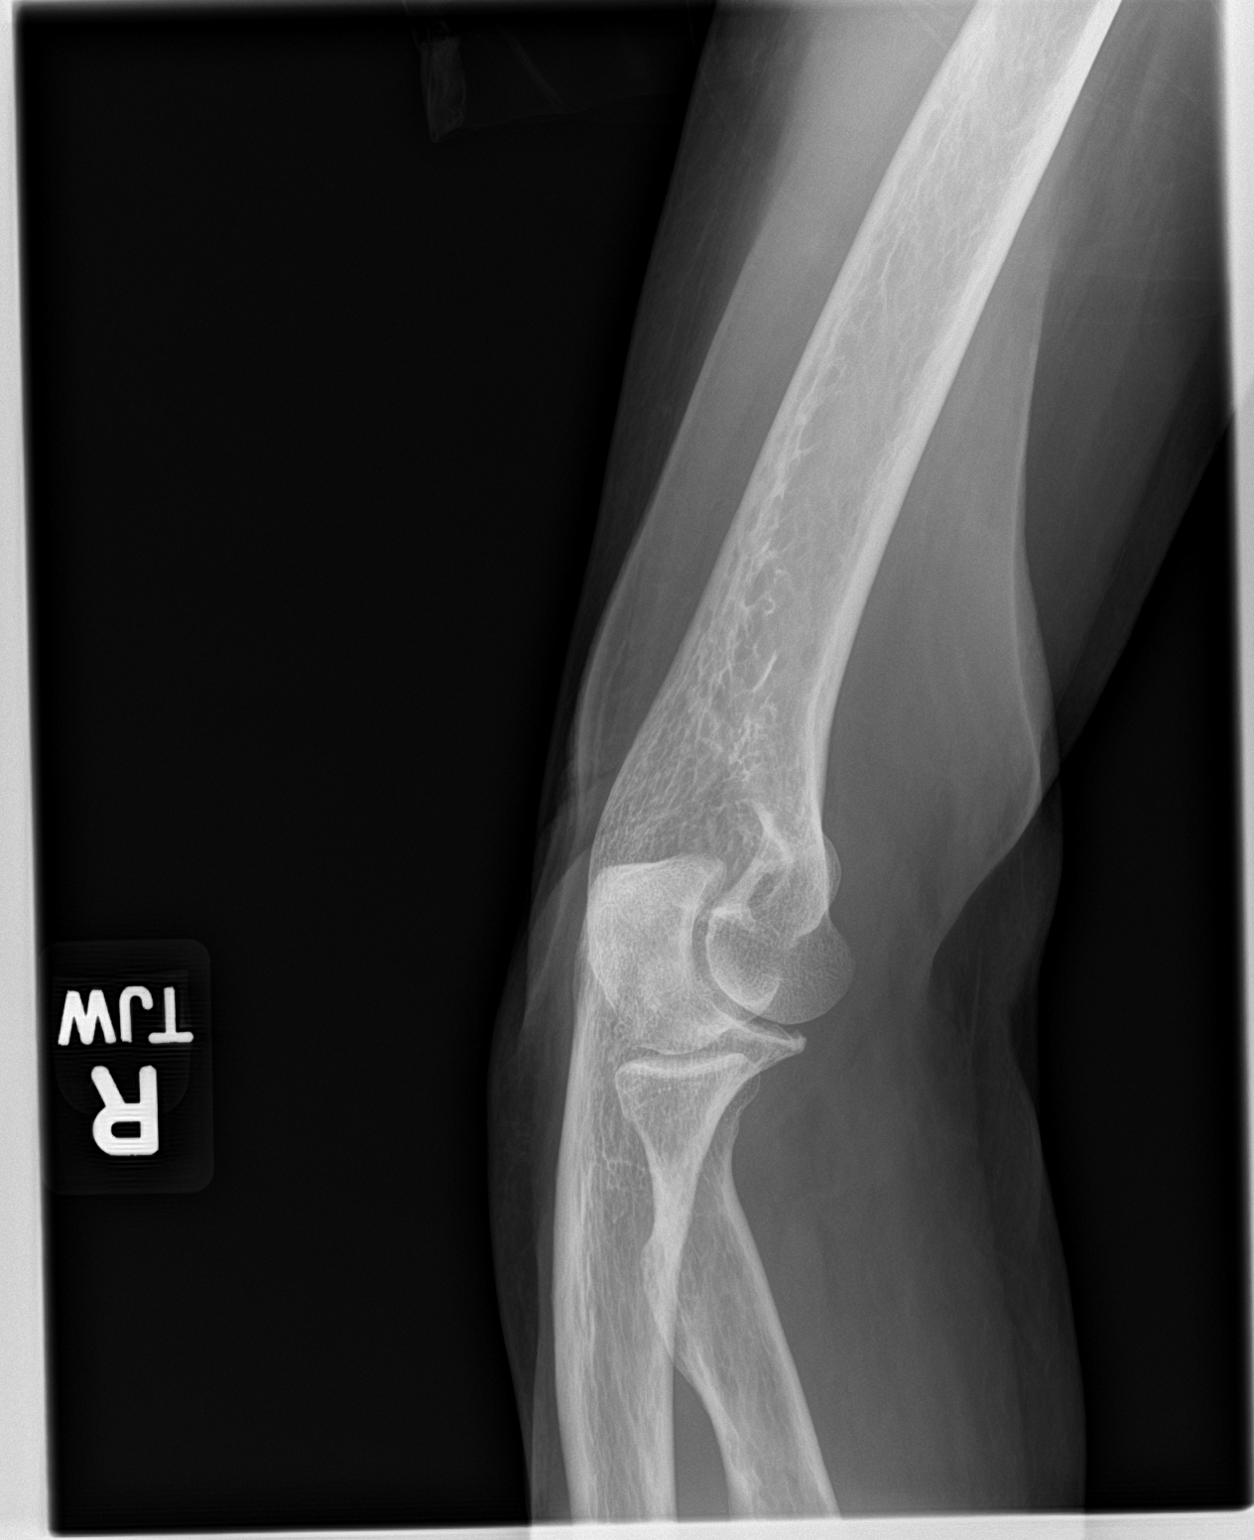

[elbow lat]
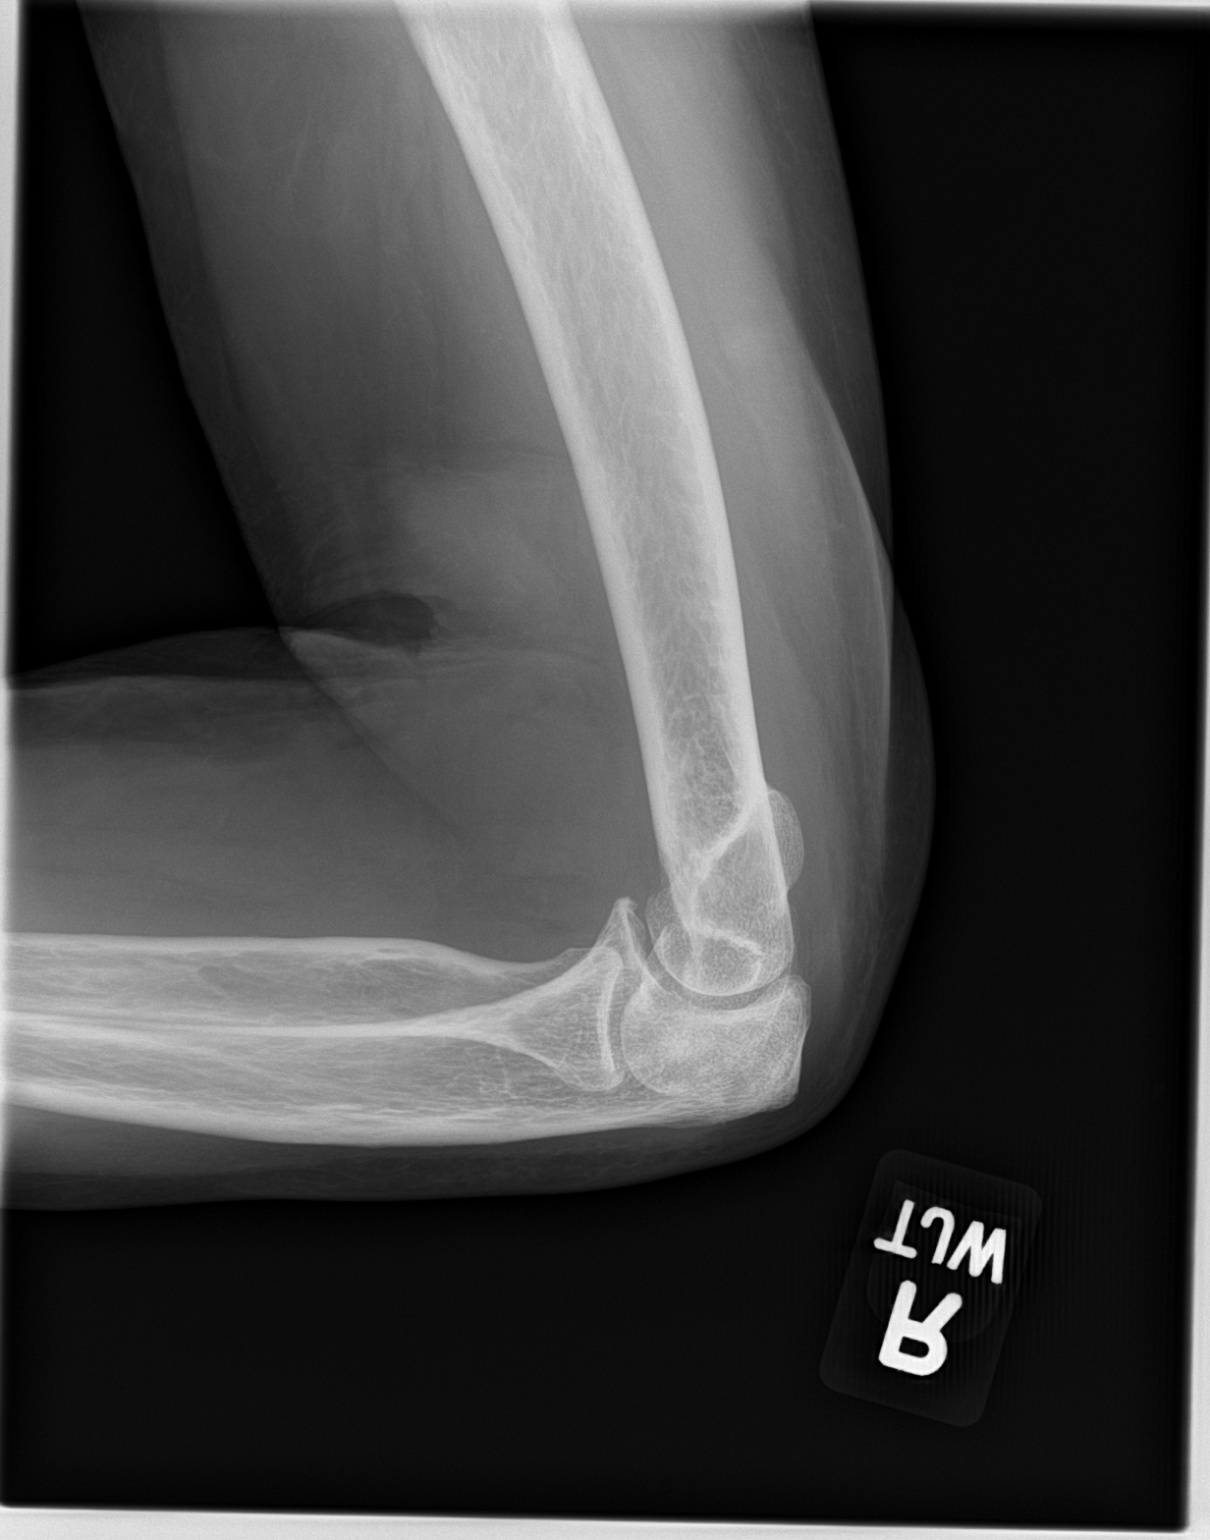

[3 of 3 positions shown; findings below may reference images not displayed]

FINDINGS: There is no evidence of fracture, dislocation, or joint effusion.
There is no evidence of arthropathy or other focal bone abnormality.
Soft tissues are unremarkable.
IMPRESSION: Negative.

## 2021-01-06 NOTE — Assessment & Plan Note (Signed)
CMET, Lipid and A1C today Encouraged her to consume a low carb diet No medications Encouraged routine eye exams Encouraged routine foot exams Encouraged her to get her flu shot and 3rd Covid booster Pneumovax and Prenvar UTD

## 2021-01-06 NOTE — Progress Notes (Signed)
Subjective:    Patient ID: Christine Gibson, female    DOB: 07/04/1951, 70 y.o.   MRN: [id number]  HPI  Patient presents the clinic today to follow up chronic conditions.  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  Her last creatinine was 4.17, GFR 12.  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 She does not check her sugars.  She is not taking any oral diabetic medication at this time. She checks her feet routinely. Flu 07/2019. Pneumovax 07/2019. Prevnar 06/2015. Covid Pfizer x 2.  HLD: Her last LDL was, triglycerides. She denies myalgias on Atorvastatin. She tries to consume a low fat diet.  HTN: Her BP today is 110/64. She it taking Amlodipine , Carvedilol, Hydralazine, Nifedapine and Losartan as prescribed.  CLL: Managed on  Zanubrutinib. She follows with oncology.  Iron Deficiency Anemia: Her last H/H was 9.7/30.6. She is taking oral iron as prescribed. She follows with oncology.  Anal Warts: S/p multiple excisions. She reports feeling a lump around her anus which is concerning. She has a follow up with her surgeon at the end of April.  She also reports right shoulder and right elbow pain. She noticed this 1 week ago after a fall in which she landed on her right side. She describes the pain as sharp, stabbing and burning. The pain does not radiate. She denies numbness or tingling of her RUE but does report some weakness. She has not tried anything OTC for this. Review of Systems      Past Medical History:  Diagnosis Date  . Chronic kidney disease   . Diabetes mellitus without complication ([place])   . History of leukemia   . Hypertension     Current Outpatient Medications  Medication Sig Dispense Refill  . amLODipine (NORVASC) 10 MG tablet Take 1 tablet (10 mg total) by mouth daily. 90 tablet 1  . apixaban (ELIQUIS) 5 MG TABS tablet Take 5 mg by mouth in the morning, at noon, in the evening, and at bedtime.    Marland Kitchen atorvastatin  (LIPITOR) 10 MG tablet Take 5 mg by mouth daily.    . calcitRIOL (ROCALTROL) 0.25 MCG capsule Take 0.25 mcg by mouth See admin instructions. Tale on Thursday and Saturday    . carvedilol (COREG) 25 MG tablet Take 25 mg by mouth in the morning and at bedtime.    . CHLORAMBUCIL PO Take by mouth.    . Cyanocobalamin (VITAMIN B-12) 5000 MCG TBDP Take 5,000 mcg by mouth daily.    Marland Kitchen CYPROHEPTADINE HCL PO Take 410 mg by mouth in the morning and at bedtime.    . ferrous sulfate 325 (65 FE) MG EC tablet Take 325 mg by mouth every Monday, Wednesday, and Friday.    . furosemide (LASIX) 40 MG tablet Take 40 mg by mouth 2 (two) times daily.     Marland Kitchen glipiZIDE (GLUCOTROL) 5 MG tablet Take 5 mg by mouth daily before breakfast.    . hydrALAZINE (APRESOLINE) 25 MG tablet Take 25 mg by mouth 3 (three) times daily.     Marland Kitchen ibrutinib (IMBRUVICA) 140 MG capsul Take 140 mg by mouth daily.    Marland Kitchen losartan (COZAAR) 50 MG tablet Take 50 mg by mouth daily.     . meclizine (ANTIVERT) 25 MG tablet Take 1 tablet (25 mg total) by mouth 3 (three) times daily as needed for dizziness. 30 tablet 0  . Multiple  Vitamins-Minerals (EYE SUPPORT) TABS Take 1 tablet by mouth daily. Occuvite    . NIFEdipine (ADALAT CC) 60 MG 24 hr tablet Take 60 mg by mouth daily.    . potassium chloride (KLOR-CON) 20 MEQ packet Take 20 mEq by mouth daily.    . Turmeric POWD by Does not apply route. Cook with it    . valACYclovir (VALTREX) 500 MG tablet Take 1 tablet (500 mg total) by mouth 2 (two) times daily. (Patient taking differently: Take 400 mg by mouth 2 (two) times daily. ) 60 tablet 1   No current facility-administered medications for this visit.    Allergies  Allergen Reactions  . Lisinopril Cough    Family History  Problem Relation Age of Onset  . Hyperlipidemia Mother   . Diabetes Mother   . Diabetes Father   . Hyperlipidemia Father     Social History   Socioeconomic History  . Marital status: Married    Spouse name: Not on file   . Number of children: Not on file  . Years of education: Not on file  . Highest education level: Not on file  Occupational History  . Not on file  Tobacco Use  . Smoking status: Never Smoker  . Smokeless tobacco: Never Used  Vaping Use  . Vaping Use: Never used  Substance and Sexual Activity  . Alcohol use: Yes    Alcohol/week: 0.0 standard drinks    Comment: occassional  . Drug use: No  . Sexual activity: Yes  Other Topics Concern  . Not on file  Social History Narrative   [employer]. L knee pain was service related.  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     Social Determinants of Health   Financial Resource Strain: Not on file  Food Insecurity: Not on file  Transportation Needs: Not on file  Physical Activity: Not on file  Stress: Not on file  Social Connections: Not on file  Intimate Partner Violence: Not on file     Constitutional: Denies fever, malaise, fatigue, headache or abrupt weight changes.  HEENT: Denies eye pain, eye redness, ear pain, ringing in the ears, wax buildup, runny nose, nasal congestion, bloody nose, or sore throat. Respiratory: Denies difficulty breathing, shortness of breath, cough or sputum production.   Cardiovascular: Denies chest pain, chest tightness, palpitations or swelling in the hands or feet.  Gastrointestinal: Denies abdominal pain, bloating, constipation, diarrhea or blood in the stool.  GU: Denies urgency, frequency, pain with urination, burning sensation, blood in urine, odor or discharge. Musculoskeletal: Pt reports right arm and right elbow pain. Denies decrease in range of motion, difficulty with gait, muscle pain or joint swelling.  Skin: Pt reports anal warts. Denies redness or ulcercations.  Neurological: Denies dizziness, difficulty with memory, difficulty with speech or problems with balance and coordination.  Psych: Denies anxiety, depression, SI/HI.  No other specific complaints in a complete review of systems (except as listed in HPI  above).  Objective:   Physical Exam  BP 110/64   Pulse 78   Temp 97.9 F (36.6 C) (Temporal)   Wt 110 lb (49.9 kg)   LMP  (LMP Unknown)   SpO2 98%   BMI 21.48 kg/m   Wt Readings from Last 3 Encounters:  06/16/20 130 lb (59 kg)  01/15/19 129 lb (58.5 kg)  01/12/19 130 lb (59 kg)    General: Appears her stated age, chronically ill appearing,  in NAD. Skin: Anal warts noted around the rectum. HEENT: Head: normal shape and  size; Eyes: sclera white, no icterus, conjunctiva pink, PERRLA and EOMs intact;  Neck:  Neck supple, trachea midline. No masses, lumps or thyromegaly present.  Cardiovascular: Normal rate and rhythm. S1,S2 noted.  No murmur, rubs or gallops noted. No JVD or BLE edema. No carotid bruits noted. LUE fistual + bruit/+thrill Pulmonary/Chest: Normal effort and positive vesicular breath sounds. No respiratory distress. No wheezes, rales or ronchi noted.  Abdomen: Soft and nontender. Normal bowel sounds. No distention or masses noted.  Musculoskeletal: Gait slow and steady without device. Neurological: Alert and oriented.    BMET    Component Value Date/Time   NA 137 01/12/2019 0908   K 3.3 (L) 01/12/2019 0908   CL 106 01/12/2019 0908   CO2 18 (L) 01/12/2019 0908   GLUCOSE 229 (H) 01/12/2019 0908   BUN 34 (H) 01/12/2019 0908   CREATININE 4.17 (H) 01/12/2019 0908   CALCIUM 8.7 (L) 01/12/2019 0908   GFRNONAA 10 (L) 01/12/2019 0908   GFRAA 12 (L) 01/12/2019 0908    Lipid Panel     Component Value Date/Time   CHOL 134 06/02/2015 0000   TRIG 109 06/02/2015 0000   HDL 43 06/02/2015 0000   LDLCALC 69 06/02/2015 0000    CBC    Component Value Date/Time   WBC 11.1 (H) 01/12/2019 0908   RBC 3.82 (L) 01/12/2019 0908   HGB 9.7 (L) 01/12/2019 0908   HCT 30.6 (L) 01/12/2019 0908   PLT 81 (L) 01/12/2019 0908   MCV 80.1 01/12/2019 0908   MCH 25.4 (L) 01/12/2019 0908   MCHC 31.7 01/12/2019 0908   RDW 17.9 (H) 01/12/2019 0908   LYMPHSABS 7.4 (H) 01/12/2019  0908   MONOABS 0.4 01/12/2019 0908   EOSABS 0.3 01/12/2019 0908   BASOSABS 0.1 01/12/2019 0908    Hgb A1C Lab Results  Component Value Date   HGBA1C 7.6 08/09/2016           Assessment & Plan:   Acute Right Shoulder Pain:  Xray right shoulder today Encouraged RICE therapy  Acute Right Elbow Pain:  Xray right elbow today Encouraged RICE therapy  Will follow up after labs and xrays, return precautions discussed  Webb Silversmith, NP This visit occurred during the SARS-CoV-2 public health emergency.  Safety protocols were in place, including screening questions prior to the visit, additional usage of staff PPE, and extensive cleaning of exam room while observing appropriate contact time as indicated for disinfecting solutions.

## 2021-01-06 NOTE — Patient Instructions (Signed)

## 2021-01-06 NOTE — Assessment & Plan Note (Signed)
CMET today On HD 3 x week Follows with nephrology through the [facility]

## 2021-01-06 NOTE — Assessment & Plan Note (Signed)
CBC today Continue oral iron 

## 2021-01-06 NOTE — Assessment & Plan Note (Signed)
Continue Zanubrutinib She will continue to follow with oncology

## 2021-01-06 NOTE — Assessment & Plan Note (Signed)
Persistent She will call to set up a follow up with her surgeon

## 2021-01-06 NOTE — Assessment & Plan Note (Signed)
CMET and Lipid profile today Encouraged her to consume a low fat diet Continue Atorvastatin for now 

## 2021-01-06 NOTE — Assessment & Plan Note (Signed)
Controlled on Amlodipine, Carvedilol, Hydralazine, Nifedapine and Losartan Reinforced DASH diet CMET today

## 2021-01-09 LAB — HEMOGLOBIN A1C
Hgb A1c MFr Bld: 5.6 % of total Hgb (ref <5.7)
Mean Plasma Glucose: 114 mg/dL
eAG (mmol/L): 6.3 mmol/L

## 2021-01-09 LAB — LIPID PANEL
Cholesterol: 116 mg/dL (ref <200)
HDL: 39 mg/dL — ABNORMAL LOW (ref >=50)
LDL Cholesterol (Calc): 59 mg/dL (calc)
Non-HDL Cholesterol (Calc): 77 mg/dL (calc) (ref <130)
Total CHOL/HDL Ratio: 3 (calc) (ref <5.0)
Triglycerides: 93 mg/dL (ref <150)

## 2021-01-09 LAB — CBC
HCT: 26.9 % — ABNORMAL LOW (ref 35.0–45.0)
Hemoglobin: 8.7 g/dL — ABNORMAL LOW (ref 11.7–15.5)
MCH: 29.4 pg (ref 27.0–33.0)
MCHC: 32.3 g/dL (ref 32.0–36.0)
MCV: 90.9 fL (ref 80.0–100.0)
MPV: 13.5 fL — ABNORMAL HIGH (ref 7.5–12.5)
Platelets: 66 10*3/uL — ABNORMAL LOW (ref 140–400)
RBC: 2.96 10*6/uL — ABNORMAL LOW (ref 3.80–5.10)
RDW: 14.4 % (ref 11.0–15.0)
WBC: 7 10*3/uL (ref 3.8–10.8)

## 2021-01-09 LAB — COMPREHENSIVE METABOLIC PANEL
AG Ratio: 2.6 (calc) — ABNORMAL HIGH (ref 1.0–2.5)
ALT: 17 U/L (ref 6–29)
AST: 17 U/L (ref 10–35)
Albumin: 4.2 g/dL (ref 3.6–5.1)
Alkaline phosphatase (APISO): 72 U/L (ref 37–153)
BUN/Creatinine Ratio: 7 (calc) (ref 6–22)
BUN: 34 mg/dL — ABNORMAL HIGH (ref 7–25)
CO2: 26 mmol/L (ref 20–32)
Calcium: 8.8 mg/dL (ref 8.6–10.4)
Chloride: 100 mmol/L (ref 98–110)
Creat: 4.72 mg/dL — ABNORMAL HIGH (ref 0.60–0.93)
Globulin: 1.6 g/dL (calc) — ABNORMAL LOW (ref 1.9–3.7)
Glucose, Bld: 94 mg/dL (ref 65–99)
Potassium: 4.5 mmol/L (ref 3.5–5.3)
Sodium: 141 mmol/L (ref 135–146)
Total Bilirubin: 0.5 mg/dL (ref 0.2–1.2)
Total Protein: 5.8 g/dL — ABNORMAL LOW (ref 6.1–8.1)

## 2021-01-09 LAB — HEPATITIS C ANTIBODY
Hepatitis C Ab: NONREACTIVE
SIGNAL TO CUT-OFF: 0 (ref <1.00)

## 2021-01-09 NOTE — Unmapped (Signed)
[facility] Refill Coordination Note    Specialty Medication(s) to be Shipped:   Hematology/Oncology: Brukinsa 80MG     Other medication(s) to be shipped: No additional medications requested for fill at this time     Cristina Fields, DOB: 1951/01/04  Phone: There are no phone numbers on file.      All above HIPAA information was verified with patient.     Was a Nurse, learning disability used for this call? No    Completed refill call assessment today to schedule patient's medication shipment from the [facility] [phone]).       Specialty medication(s) and dose(s) confirmed: Regimen is correct and unchanged.   Changes to medications: Cristina Fields reports no changes at this time.  Changes to insurance: No  Questions for the pharmacist: No    Confirmed patient received Welcome Packet with first shipment. The patient will receive a drug information handout for each medication shipped and additional FDA Medication Guides as required.       DISEASE/MEDICATION-SPECIFIC INFORMATION        N/A    SPECIALTY MEDICATION ADHERENCE     Medication Adherence    Patient reported X missed doses in the last month: 0  Specialty Medication: Brukinsa 80 mg  Patient is on additional specialty medications: No  Informant: patient                Brukinsa 80 mg: 14 days of medicine on hand          SHIPPING     Shipping address confirmed in Epic.     Delivery Scheduled: Yes, Expected medication delivery date: 01/19/21.     Medication will be delivered via UPS to the prescription address in Epic [place].    Wyatt Mage M Elisabeth Cara   [facility] Specialty Technician

## 2021-01-12 ENCOUNTER — Telehealth: Payer: Self-pay | Admitting: Internal Medicine

## 2021-01-12 NOTE — Telephone Encounter (Signed)
They are calling in reference to her xray results. States they have not heard back from them yet. EM

## 2021-01-13 NOTE — Telephone Encounter (Signed)
See results note, I called and lmovm

## 2021-01-13 NOTE — Telephone Encounter (Signed)
Was released to her mycahrt

## 2021-01-25 ENCOUNTER — Other Ambulatory Visit: Admit: 2021-01-25 | Discharge: 2021-01-25 | Payer: MEDICARE

## 2021-01-25 ENCOUNTER — Ambulatory Visit: Admit: 2021-01-25 | Discharge: 2021-01-25 | Payer: MEDICARE | Attending: Adult Health | Primary: Adult Health

## 2021-01-25 DIAGNOSIS — C9112 Chronic lymphocytic leukemia of B-cell type in relapse: Secondary | ICD-10-CM | POA: Diagnosis not present

## 2021-01-25 LAB — COMPREHENSIVE METABOLIC PANEL
ALBUMIN: 3.2 g/dL — ABNORMAL LOW (ref 3.4–5.0)
ALKALINE PHOSPHATASE: 91 U/L (ref 46–116)
ALT (SGPT): 18 U/L (ref 10–49)
ANION GAP: 9 mmol/L (ref 5–14)
AST (SGOT): 21 U/L (ref <=34)
BILIRUBIN TOTAL: 0.3 mg/dL (ref 0.3–1.2)
BLOOD UREA NITROGEN: 34 mg/dL — ABNORMAL HIGH (ref 9–23)
BUN / CREAT RATIO: 7
CALCIUM: 8.1 mg/dL — ABNORMAL LOW (ref 8.7–10.4)
CHLORIDE: 102 mmol/L (ref 98–107)
CO2: 26 mmol/L (ref 20.0–31.0)
CREATININE: 4.91 mg/dL — ABNORMAL HIGH
EGFR CKD-EPI AA FEMALE: 10 mL/min/{1.73_m2} — ABNORMAL LOW (ref >=60)
EGFR CKD-EPI NON-AA FEMALE: 8 mL/min/{1.73_m2} — ABNORMAL LOW (ref >=60)
GLUCOSE RANDOM: 153 mg/dL (ref 70–179)
POTASSIUM: 3.6 mmol/L (ref 3.4–4.8)
PROTEIN TOTAL: 5.5 g/dL — ABNORMAL LOW (ref 5.7–8.2)
SODIUM: 137 mmol/L (ref 135–145)

## 2021-01-25 LAB — CBC W/ AUTO DIFF
BASOPHILS ABSOLUTE COUNT: 0.1 10*9/L (ref 0.0–0.1)
BASOPHILS RELATIVE PERCENT: 1.2 %
EOSINOPHILS ABSOLUTE COUNT: 0.1 10*9/L (ref 0.0–0.5)
EOSINOPHILS RELATIVE PERCENT: 1.4 %
HEMATOCRIT: 21.8 % — ABNORMAL LOW (ref 34.0–44.0)
HEMOGLOBIN: 7.3 g/dL — ABNORMAL LOW (ref 11.3–14.9)
LYMPHOCYTES ABSOLUTE COUNT: 2.1 10*9/L (ref 1.1–3.6)
LYMPHOCYTES RELATIVE PERCENT: 45.1 %
MEAN CORPUSCULAR HEMOGLOBIN CONC: 33.5 g/dL (ref 32.0–36.0)
MEAN CORPUSCULAR HEMOGLOBIN: 29.2 pg (ref 25.9–32.4)
MEAN CORPUSCULAR VOLUME: 87.1 fL (ref 77.6–95.7)
MEAN PLATELET VOLUME: 10.6 fL (ref 6.8–10.7)
MONOCYTES ABSOLUTE COUNT: 0.4 10*9/L (ref 0.3–0.8)
MONOCYTES RELATIVE PERCENT: 7.7 %
NEUTROPHILS ABSOLUTE COUNT: 2.1 10*9/L (ref 1.8–7.8)
NEUTROPHILS RELATIVE PERCENT: 44.6 %
PLATELET COUNT: 50 10*9/L — ABNORMAL LOW (ref 150–450)
RED BLOOD CELL COUNT: 2.5 10*12/L — ABNORMAL LOW (ref 3.95–5.13)
RED CELL DISTRIBUTION WIDTH: 15.4 % — ABNORMAL HIGH (ref 12.2–15.2)
WBC ADJUSTED: 4.7 10*9/L (ref 3.6–11.2)

## 2021-01-25 LAB — SLIDE REVIEW

## 2021-01-25 NOTE — Unmapped (Cosign Needed)
70 yo female w/ r/r CLL on zanubrutinib 160mg  ONCE daily dosing (PPI/HD) x 2 months, now. She has history of drenching nightsweats, anemia and thrombocytopenia. She is now back at home and getting dialysis locally.    Interval:  Hydrocortisone and lidocaine creams do not help. Went to [facility] ED on 4/4 and gave her these creams. 2 months ago it was shaved by [facility] dermatologist which partially resolved the problem but there is still a lesion there that is very painful, today she is at an 8/10. HD and driving long distances are exacerbating factors. Can't get a dermatology appointment until 4/15th at the [facility].   Nodules on legs and rash at face has completely resolved   No infections in the interim.     ROS:  CONSTITUTIONAL: No fevers, chills, weight loss, night sweats.   HEENT: Eyes: No blurred vision. ENT: No earache, sore throat or runny nose.   CARDIOVASCULAR: No chest pain, palpitations.  RESPIRATORY: No cough, shortness of breath, PND or orthopnea.   GASTROINTESTINAL: see notes above  GENITOURINARY: No dysuria, frequency or urgency.   MUSCULOSKELETAL: No muscle pain or weakness.  SKIN: No rashes or other skin complaints.  NEUROLOGIC: No headaches, paresthesias, fasciculations, seizures.  PSYCHIATRIC: No disorder of thought or mood.   ENDOCRINE: No heat or cold intolerance, polyuria or polydipsia.   HEMATOLOGICAL: No easy bruising or bleeding.     Labs:    Physical Exam:  General: Well-appearing in NAD  HEENT: Neck supple, membranes moist and pink; sclera anicteric  CARDIO: S1, S2, RRR, no clicks, gallops, rubs or murmurs  RESP: Non-labored, CTAB; no crackles, rhonchi or wheezes  GI: Abdomen soft, round, non-tender; bowel sounds normoactive x 4; no hepato/splenomegaly  GU: No CVA tenderness  MSK:  Full ROM in all major joints with no crepitus, swelling  EXT: Warm, well-perfused; no pitting edema   SKIN:  No visible rashes or lesions  LYMPH: No palpable cervical, supraclavicular, axillary or inguinal nodes  Neuro: AOx4, gait steady, no focal deficits  Psych: Appropriate mood and affect  CVAD: None    Plan:  Blood transfusions at [place] in 2021, but Hgb is nearing threshold @ 7.3. Needs transfusion sometime in the next few days.   RN f/u call tomorrow to assess for    Recommend ED for pain crisis, shared decision making decided to go home, get their medications and then decide which ED to go to, even tomorrow.

## 2021-01-25 NOTE — Unmapped (Signed)
From a CLL standpoint, you are doing well on the current treatment and we recommend you continue to take the zanubrutinib as you have been doing.     We are very concerned about your severe pain because it has been going on for quite some time and cannot be controlled at home. We understand you may not wish to go right at this moment, but we do recommend you go to an Emergency Room to be evaluated for pain crisis in the next 24 hours. Your platelets are low (50K) because of your CLL and the medication you are on, so if you go to another provider who wants to do a procedure for your lesions, please alert them that you are at high risk for bleeding.    Your hemoglobin level is low, and we recommend you get a blood transfusion sometime in the next few days. If you go to an ED, they can give you a transfusion there. You may also be able to get a transfusion where you get dialysis. We are glad to arrange for you to get a transfusion here in the outpatient infusion center. One of our nurses will call you tomorrow to see what you have decided to do and make sure that you are okay. The nurse who calls you tomorrow can help with making sure this happens in a way that makes sense for you.

## 2021-01-26 ENCOUNTER — Encounter: Admit: 2021-01-26 | Discharge: 2021-01-30 | Payer: MEDICARE

## 2021-01-26 ENCOUNTER — Ambulatory Visit: Admit: 2021-01-26 | Discharge: 2021-01-30 | Payer: MEDICARE

## 2021-01-26 DIAGNOSIS — D013 Carcinoma in situ of anus and anal canal: Secondary | ICD-10-CM | POA: Diagnosis not present

## 2021-01-26 DIAGNOSIS — E1122 Type 2 diabetes mellitus with diabetic chronic kidney disease: Secondary | ICD-10-CM | POA: Diagnosis not present

## 2021-01-26 DIAGNOSIS — N186 End stage renal disease: Secondary | ICD-10-CM | POA: Diagnosis not present

## 2021-01-26 DIAGNOSIS — K6289 Other specified diseases of anus and rectum: Secondary | ICD-10-CM | POA: Diagnosis not present

## 2021-01-26 DIAGNOSIS — A630 Anogenital (venereal) warts: Secondary | ICD-10-CM | POA: Diagnosis not present

## 2021-01-26 DIAGNOSIS — Z992 Dependence on renal dialysis: Secondary | ICD-10-CM | POA: Diagnosis not present

## 2021-01-26 DIAGNOSIS — D649 Anemia, unspecified: Secondary | ICD-10-CM | POA: Diagnosis not present

## 2021-01-26 DIAGNOSIS — I120 Hypertensive chronic kidney disease with stage 5 chronic kidney disease or end stage renal disease: Secondary | ICD-10-CM | POA: Diagnosis not present

## 2021-01-26 DIAGNOSIS — R197 Diarrhea, unspecified: Secondary | ICD-10-CM | POA: Diagnosis not present

## 2021-01-26 DIAGNOSIS — K629 Disease of anus and rectum, unspecified: Secondary | ICD-10-CM | POA: Diagnosis not present

## 2021-01-26 DIAGNOSIS — C20 Malignant neoplasm of rectum: Secondary | ICD-10-CM | POA: Diagnosis not present

## 2021-01-26 DIAGNOSIS — D61818 Other pancytopenia: Secondary | ICD-10-CM | POA: Diagnosis not present

## 2021-01-26 DIAGNOSIS — R112 Nausea with vomiting, unspecified: Secondary | ICD-10-CM | POA: Diagnosis not present

## 2021-01-26 DIAGNOSIS — E119 Type 2 diabetes mellitus without complications: Secondary | ICD-10-CM | POA: Diagnosis not present

## 2021-01-26 DIAGNOSIS — Z20822 Contact with and (suspected) exposure to covid-19: Secondary | ICD-10-CM | POA: Diagnosis not present

## 2021-01-26 DIAGNOSIS — C9110 Chronic lymphocytic leukemia of B-cell type not having achieved remission: Secondary | ICD-10-CM | POA: Diagnosis not present

## 2021-01-26 DIAGNOSIS — D696 Thrombocytopenia, unspecified: Secondary | ICD-10-CM | POA: Diagnosis not present

## 2021-01-26 DIAGNOSIS — R2689 Other abnormalities of gait and mobility: Secondary | ICD-10-CM | POA: Diagnosis not present

## 2021-01-26 LAB — CBC W/ AUTO DIFF
BASOPHILS ABSOLUTE COUNT: 0 10*9/L (ref 0.0–0.1)
BASOPHILS RELATIVE PERCENT: 1.1 %
EOSINOPHILS ABSOLUTE COUNT: 0 10*9/L (ref 0.0–0.5)
EOSINOPHILS RELATIVE PERCENT: 1.1 %
HEMATOCRIT: 23 % — ABNORMAL LOW (ref 34.0–44.0)
HEMOGLOBIN: 7.7 g/dL — ABNORMAL LOW (ref 11.3–14.9)
LYMPHOCYTES ABSOLUTE COUNT: 1.6 10*9/L (ref 1.1–3.6)
LYMPHOCYTES RELATIVE PERCENT: 38.2 %
MEAN CORPUSCULAR HEMOGLOBIN CONC: 33.6 g/dL (ref 32.0–36.0)
MEAN CORPUSCULAR HEMOGLOBIN: 29.5 pg (ref 25.9–32.4)
MEAN CORPUSCULAR VOLUME: 87.7 fL (ref 77.6–95.7)
MEAN PLATELET VOLUME: 10.7 fL (ref 6.8–10.7)
MONOCYTES ABSOLUTE COUNT: 0.3 10*9/L (ref 0.3–0.8)
MONOCYTES RELATIVE PERCENT: 6.6 %
NEUTROPHILS ABSOLUTE COUNT: 2.2 10*9/L (ref 1.8–7.8)
NEUTROPHILS RELATIVE PERCENT: 53 %
PLATELET COUNT: 48 10*9/L — ABNORMAL LOW (ref 150–450)
RED BLOOD CELL COUNT: 2.62 10*12/L — ABNORMAL LOW (ref 3.95–5.13)
RED CELL DISTRIBUTION WIDTH: 15.5 % — ABNORMAL HIGH (ref 12.2–15.2)
WBC ADJUSTED: 4.1 10*9/L (ref 3.6–11.2)

## 2021-01-26 LAB — COMPREHENSIVE METABOLIC PANEL
ALBUMIN: 3.1 g/dL — ABNORMAL LOW (ref 3.4–5.0)
ALKALINE PHOSPHATASE: 81 U/L (ref 46–116)
ALT (SGPT): 15 U/L (ref 10–49)
ANION GAP: 11 mmol/L (ref 5–14)
AST (SGOT): 23 U/L (ref <=34)
BILIRUBIN TOTAL: 0.3 mg/dL (ref 0.3–1.2)
BLOOD UREA NITROGEN: 48 mg/dL — ABNORMAL HIGH (ref 9–23)
BUN / CREAT RATIO: 7
CALCIUM: 8.5 mg/dL — ABNORMAL LOW (ref 8.7–10.4)
CHLORIDE: 101 mmol/L (ref 98–107)
CO2: 25 mmol/L (ref 20.0–31.0)
CREATININE: 6.42 mg/dL — ABNORMAL HIGH
EGFR CKD-EPI AA FEMALE: 7 mL/min/{1.73_m2} — ABNORMAL LOW (ref >=60)
EGFR CKD-EPI NON-AA FEMALE: 6 mL/min/{1.73_m2} — ABNORMAL LOW (ref >=60)
GLUCOSE RANDOM: 238 mg/dL — ABNORMAL HIGH (ref 70–179)
POTASSIUM: 3.8 mmol/L (ref 3.4–4.8)
PROTEIN TOTAL: 5.4 g/dL — ABNORMAL LOW (ref 5.7–8.2)
SODIUM: 137 mmol/L (ref 135–145)

## 2021-01-26 LAB — LIPASE: LIPASE: 45 U/L (ref 12–53)

## 2021-01-26 LAB — SLIDE REVIEW

## 2021-01-26 MED ADMIN — ondansetron (ZOFRAN) injection 4 mg: 4 mg | INTRAVENOUS | @ 19:00:00 | Stop: 2021-01-26

## 2021-01-26 MED ADMIN — MORPhine 4 mg/mL injection 4 mg: 4 mg | INTRAVENOUS | @ 19:00:00 | Stop: 2021-01-26

## 2021-01-26 NOTE — Unmapped (Signed)
Hi,     Cristina Fields has contacted the Communication [facility] in regards to the following symptom:     Nausea and vomiting     Patient threw up and he is going to bring her back to the ER now.    Please contact at [phone]    Check Indicates criteria has been reviewed and confirmed with the patient:    []  Preferred Name   [x]  DOB and/or MR#  [x]  Preferred Contact Method  [x]  Phone Number(s)   []  MyChart     A page or telephone call has been made to the corresponding clinic.     Thank you,  Vernie Ammons   [facility]   [phone]

## 2021-01-26 NOTE — Unmapped (Signed)
Emergency Department Provider in Triage Note    Cristina Fields is a 70 y.o. 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 She has been followed by [facility] dermatologist for lesions that have received treatment. Hydrocortisone and lidocaine creams doe not help. Went to [facility] on 4/4 where she received these creams. Endorsing nausea and vomiting as well.      BRIEF PHYSICAL EXAM    VITAL SIGNS:    ED Triage Vitals   Vital Signs Group      Temp       Temp src       Pulse       SpO2 Pulse       Heart Rate Source       Resp       BP       MAP (mmHg)       BP Location       BP Method       Patient Position    SpO2    O2 Flow Rate (L/min)    O2 Device             Constitutional:  Awake, alert, no apparent distress  HENT:  Normocephalic, atraumatic. Nose normal. no stridor.  Eyes:  EOMI, conjunctiva normal, no discharge.   Cardiovascular: Normal rate, regular rhythm. Normal and symmetric distal pulses are present in all extremities.  Respiratory: no respiratory distress, normal work of breathing  GI: nondistended  Integument:  No diaphoresis  Musculoskeletal:  Moving without difficulty  Neurologic:  Alert and appropriate  Psychiatric:  Affect normal, judgment normal, mood normal.        Per chart review, she was sent here for pain crisis from lesions and possible transfusion given her last Hb was nearing the threshold at 7.3. Plan for type and screen, cbc, cmp, lipase.       Pearlean Brownie, MD  January 26, 2021 11:38 AM

## 2021-01-26 NOTE — Unmapped (Signed)
Voicemail message left stating that I was following up for Langley Gauss on concerns from yesterday's visit and advice to go to ED.    Stated I will send MyChart Message as follow up

## 2021-01-26 NOTE — Unmapped (Unsigned)
Mrs. Lacerte is a 70 y.o. female with PMHx of CKD on diaylsis, HTN and  relapsed/refractory CLL currently on Zanubrutinib  ??  Therapy: zanubrutinib 160mg  ONCE daily - chosen as safest option with dialysis and PPI use. See 1/14 note for discussion of treatment options.  Treatment indication: significant drenching night sweats, anemia and thrombocytopenia.  ??  She has now been on zanubrutinib for 1 month. WBC/ALC have dropped significantly. Hemoglobin and platelets were unsually (for her) on the initial tests at [facility] but are now closer to her baseline. She has not needed a blood transfusion.  She is tolerating well. She developed a mild facial rash and this has been stable, not bothersome. In the past 2 weeks she noticed nodules along her legs but they are not clearly connected to her therapy and are not painful or bothersome.  Her BP is up, this may be stress related but I encouraged her to follow up with her nephrologists regarding BP.  She is trying to get home from SNF but this is delayed until the local dialysis center can assume her care. This delay is quite stressful for her.  ??  Provided patient with flyer for [id number]   ??  ??  Plans and Recommendations:  1. CLL  -??continue zanubritunib ONCE daily  - RTC with labs in 4 weeks  ??  2. Anemia/thrombocytopenia - multifactorial - CLL and renal failure. Required transfusions prior to treatment  - monitor  ??  3. Rash, likely drug-related: facial rash developed 1 week after start of zanubrutinib  - improving  - continue hydrocortisone cream PRN  ??  4. HTN: recommended she follow up with her nephrologist  ??  5. Skin nodules - subcutaneous nodules in bilateral legs, primarily noted early March  -unclear etiology - will monitor

## 2021-01-26 NOTE — Unmapped (Signed)
[facility] Emergency Department Provider Note      ED Course, Assessment and Plan     Initial Clinical Impression:    January 26, 2021 1:31 PM   Cristina Fields is a 70 y.o. female presenting with a chief complaint of nausea/vomiting/diarrhea and severe rectal pain, as described below. On exam, patient is an uncomfortable appearing 70 year old female, in no respiratory distress.  Her cardiopulmonary exam is benign.  Abdominal exam is benign.  Extremity exam is benign.  On rectal exam, patient has significant amount of anal condylomas that are friable and have pinpoint areas of bleeding.  Patient is very tender over the area.         BP 125/79  - Pulse 90  - Temp 36.9 ??C (98.4 ??F) (Oral)  - Resp 18  - Wt 51.3 kg (113 lb)  - SpO2 95%  - BMI 22.81 kg/m??     Assessment and Plan: Given patient's history and presentation, will obtain complete lab work.Disposition pending work-up.     Diagnostic and treatment orders as below.    Orders Placed This Encounter   Procedures   ??? Comprehensive Metabolic Panel   ??? CBC w/ Differential   ??? Lipase Level   ??? Type and Screen   ??? ED Admit Decision (ADT9)       Medications   MORPhine 4 mg/mL injection 4 mg (has no administration in time range)   ondansetron (ZOFRAN) injection 4 mg (has no administration in time range)       ED Course:  ED Course as of 01/26/21 1713   Thu Jan 26, 2021   1500 Patient's CBC showing hemoglobin on 7.7 Platelets pending. Patient got minimal pain relief with morphine. I suspect patient will need to be admitted for further work-up per her heme/onc team. Signed out to oncoming provider pending disposition.          _____________________________________________________________________    The case was discussed with Dr. Blima Singer, MD* who is in agreement with the above assessment and plan    Dictation software was used while making this note. Please excuse any errors made with dictation software.    Additional Medical Decision Making     I have reviewed the vital signs and the nursing notes. Labs and radiology results that were available during my care of the patient were independently interpreted  by me and considered in my medical decision making.   I independently interpreted the EKG tracing if performed  I independently interpreted the radiology images if performed  I reviewed the patient's prior medical records if available.  Additional history obtained from family if available    History     CHIEF COMPLAINT:   Chief Complaint   Patient presents with   ??? Rectal Pain   ??? Abnormal Lab       HPI: Cristina Fields is a 70 y.o. 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 She has been followed by [facility] dermatologist for lesions that have received treatment.  She states that she has been dealing with this for a long time but over the last 4 days, she has been having worsening pain that is uncontrollable.  She is also experiencing some nausea vomiting and loose stools which are unbearable due to the pain from wiping.  Patient called her hematologist office and was recommended that she  come to the emergency department for potential blood transfusion.  Other than some generalized weakness, she does not have any lightheadedness or dizziness or chest pain or shortness of breath.    PAST MEDICAL HISTORY/PAST SURGICAL HISTORY:   No past medical history on file.    No past surgical history on file.    MEDICATIONS:     Current Facility-Administered Medications:   ???  MORPhine 4 mg/mL injection 4 mg, 4 mg, Intravenous, Once, Melene Plan, MD  ???  ondansetron (ZOFRAN) injection 4 mg, 4 mg, Intravenous, Once, Melene Plan, MD    Current Outpatient Medications:   ???  amLODIPine (NORVASC) 10 MG tablet, Take 1 tablet by mouth daily. , Disp: , Rfl:   ???  apixaban (ELIQUIS) 5 mg Tab, Take by mouth. (Patient not taking: Reported on 01/25/2021), Disp: , Rfl:   ???  aspirin (ECOTRIN) 81 MG tablet, Take 81 mg by mouth daily. (Patient not taking: Reported on 01/25/2021), Disp: , Rfl:   ???  atorvastatin (LIPITOR) 40 MG tablet, Take 40 mg by mouth daily., Disp: , Rfl:   ???  carvediloL (COREG) 12.5 MG tablet, Take 12.5 mg by mouth Two (2) times a day., Disp: , Rfl:   ???  hydrALAZINE (APRESOLINE) 10 MG tablet, Take 10 mg by mouth Three (3) times a day., Disp: , Rfl:   ???  hydrocortisone 1 % cream, Apply to affected area 2 times daily (Patient not taking: Reported on 01/25/2021), Disp: 30 g, Rfl: 0  ???  lidocaine (XYLOCAINE) 5 % ointment, Apply topically two (2) times a day as needed. Apply to rectum topically as needed for pain for up to two times a day, Disp: , Rfl:   ???  loratadine (CLARITIN) 10 mg tablet, Take 1 tablet (10 mg total) by mouth daily., Disp: 30 tablet, Rfl: 2  ???  losartan (COZAAR) 100 MG tablet, Take 100 mg by mouth daily., Disp: , Rfl:   ???  methoxy peg-epoetin beta (MIRCERA INJ), 60 mcg. (Patient not taking: Reported on 01/25/2021), Disp: , Rfl:   ???  NIFEdipine (ADALAT CC) 60 MG 24 hr tablet, Take 60 mg by mouth daily., Disp: , Rfl:   ???  ondansetron (ZOFRAN) 8 MG tablet, Take 1 tablet by mouth every eight (8) hours. (Patient not taking: Reported on 01/25/2021), Disp: , Rfl:   ???  oxyCODONE (OXY-IR) 5 mg capsule, Take 2.5 mg by mouth every six (6) hours as needed., Disp: , Rfl:   ???  pantoprazole (PROTONIX) 40 MG tablet, Take 40 mg by mouth daily. (Patient not taking: Reported on 01/25/2021), Disp: , Rfl:   ???  sertraline (ZOLOFT) 100 MG tablet, Take 100 mg by mouth daily., Disp: , Rfl:   ???  traMADoL (ULTRAM) 50 mg tablet, Take 50 mg by mouth every six (6) hours as needed for pain. , Disp: , Rfl:   ???  UNABLE TO FIND, Heparin Sodium (Porcine) 1,000 Units/mL Systemic (Patient not taking: Reported on 01/25/2021), Disp: , Rfl:   ???  zanubrutinib (BRUKINSA) capsule, Take 2 capsules (160 mg total) by mouth daily. Swallow whole with water. Do not open, break, or chew capsule., Disp: 60 capsule, Rfl: 5  ???  zinc oxide 20 % ointment, Apply 1 application topically as needed for dry skin. Apply to rectum topically every day for pain  (Patient not taking: Reported on 01/25/2021), Disp: , Rfl:     ALLERGIES:   Lisinopril    SOCIAL HISTORY:   Social History  Tobacco Use   ??? Smoking status: Not on file   ??? Smokeless tobacco: Not on file   Substance Use Topics   ??? Alcohol use: Not on file       FAMILY HISTORY:  No family history on file.       REVIEW OF SYSTEMS: 12 point review of systems was performed and is negative other than positive elements noted in HPI   General/Constitutional: Negative for fever.  HEENT:  Negative for vision changes.  Cardiovascular: Negative for chest pain.  Respiratory: Negative for shortness of breath.  Gastrointestinal: Positive for N/V/D, rectal pain.   Genitourinary: Negative for dysuria.  Musculoskeletal: Negative for back pain.  Integumentary: Negative for rash.  Neurologic: Negative for headache, focal weakness or numbness.  Psychiatric: Negative for hallucinations.  Hematologic: Negative for easy bruising or petechiae.  Allergy: Negative for hives      Physical Exam     VITAL SIGNS:    BP 125/79  - Pulse 90  - Temp 36.9 ??C (98.4 ??F) (Oral)  - Resp 18  - Wt 51.3 kg (113 lb)  - SpO2 95%  - BMI 22.81 kg/m??     Constitutional: Alert and oriented. Uncomfortable appearing.  Eyes: Conjunctivae are normal.  HEENT: Normocephalic and atraumatic.Conjunctivae clear. No congestion. Moist mucous membranes.   Cardiovascular: Normal rate, regular rhythm. Normal and symmetric distal pulses. Brisk capillary refill. Normal skin turgor.  Respiratory: Normal respiratory effort. Breath sounds are normal. There are no wheezing or crackles heard.  Gastrointestinal: Soft, non-tender, non-distended. See media tab for findings on rectal exam.   Musculoskeletal: Nontender with normal range of motion in all extremities.       Right lower leg: No tenderness or edema.       Left lower leg: No tenderness or edema.  Neurologic: Normal speech and language. No gross focal neurologic deficits are appreciated. Patient is moving all extremities equally, face is symmetric at rest and with speech.  Skin: Skin is warm, dry and intact. No rash noted.  Psychiatric: Mood and affect are normal. Speech and behavior are normal.    Radiology     No orders to display       Labs     Labs Reviewed   COMPREHENSIVE METABOLIC PANEL - Abnormal; Notable for the following components:       Result Value    BUN 48 (*)     Creatinine 6.42 (*)     EGFR CKD-EPI Non-African American, Female 6 (*)     EGFR CKD-EPI African American, Female 7 (*)     Glucose 238 (*)     Calcium 8.5 (*)     Albumin 3.1 (*)     Total Protein 5.4 (*)     All other components within normal limits   CBC W/ AUTO DIFF - Abnormal; Notable for the following components:    RBC 2.62 (*)     HGB 7.7 (*)     HCT 23.0 (*)     RDW 15.5 (*)     All other components within normal limits   LIPASE - Normal   CBC W/ DIFFERENTIAL    Narrative:     The following orders were created for panel order CBC w/ Differential.  Procedure                               Abnormality         Status                     ---------                               -----------         ------  CBC w/ Differential[[id number]]         Abnormal            Preliminary result           Please view results for these tests on the individual orders.   TYPE AND SCREEN       Pertinent labs & imaging results that were available during my care of the patient were reviewed by me and considered in my medical decision making (see chart for details).    Please note- This chart has been created using [employer]. Chart creation errors have been sought, but may not always be located and such creation errors, especially pronoun confusion, do NOT reflect on the standard of medical care.    Melene Plan, MD  EM PGY1     Melene Plan, MD  Resident  01/26/21 [phone]

## 2021-01-26 NOTE — Unmapped (Addendum)
8/10 chronic rectal pain. Perineal condyloma diagnosed 11/2020, followed by dermatology but next appointment is a week from today. Saw oncologist yesterday, wanted her to come to the ED to expedite care. Hx stroke 09/2020, CLL. Patient has a low hemoglobin and was sent to the ED for transfusion.

## 2021-01-26 NOTE — Unmapped (Signed)
[facility] Triage Note     Patient: Cristina Fields     Reason for call:  return call    Time call returned: 0945     Phone Assessment: [name], spouse is calling to let the team know that he is on his way back to the Ed now with the pt.  She is not feeling well today and has vomited once but is still nauseated.  Per earlier note by Danice Goltz, NN pt also in need of transfusion for hgb of 7.3       RN will notify team that they are on their way

## 2021-01-26 NOTE — Unmapped (Signed)
Internal Medicine (MEDL) History & Physical    Assessment & Plan:   Cristina Fields is a 70 y.o. female with PMHx of CKD on dialysis, CVA w/ residual tongue deviation, and CLL that presented to [facility] with severe rectal pain, N/V, diarrhea, and anemia.  Principal Problem:    Anal lesion  Active Problems:    Condyloma    Other specified anemias    Thrombocytopenia (CMS-HCC)    Type 2 diabetes mellitus without complication, without long-term current use of insulin (CMS-HCC)    CKD (chronic kidney disease) requiring chronic dialysis (CMS-HCC)  Resolved Problems:    * No resolved hospital problems. *    Rectal pain - Exophytic anal lesion likely Condyloma   Patient is followed by [facility] dermatology for lesion. Has been present for ~1 year (since before her CLL diagnosis). Last shaved by derm two months ago. Experienced acute worsening of pain over last 4 days. Appearance is consistent with condyloma which makes sense in setting of immunocompromise due to advanced CLL. Lesion has slight bleeding (she estimates 1Tbl) but no  - IV dilauded PRN  - Tylenol 1000 mg PO every 8 hrs  - Consult dermatology and oncology in AM  - Consider palliative consult in AM    Diarrhea and N/V 2/2 pain vs infectious diarrhea  New onset today. 3 Large volume liquid stools. Given immunocompromise there is concern for infectious process but could also be sequelae of severe pain. Concern for obstruction is low as patient endorses no difficulty passing BM although mass is painful to pass stool through. Unclear if N/V is caused by pain or if pain is caused by diarrhea. No fevers/chills/leukocytosis and abdomen soft, given new diarrhea of unclear cause with N/V in this immunocompromised woman, we will evaluate for infectious causes of her diarrhea.   - GIPP/C Diff/CMV  - Ondansetron IV/PO PRN    Anemia (BL 9?) - Thrombocytopenia (BL50s?) - CLL  In setting of advanced CLL, this could be due to any combination of a number of factors including bone marrow infiltration, autoimmune destruction, splenic sequestration, or drug toxicities. Her hemoglobin and platelets have been trending down since January 2021 but have been relatively stable for the last month. However, in setting of oozing exophytic anal mass there is concern for blood loss anemia as well.  - HOLD Zanubrutinib (Husband gave patient last dose 4/7 PM)  - Consult oncology for inpatient recommendations and to confirm transfusion goal  - Daily CBC    Petechial Rash on Face   See picture in chart. One day history. No similar rashes in the past. Possibly drug related vs trauma although reports no head trauma to our team.   - f/u with oncology given cf drug reaction    Type II diabetes  Not currently on home regimen. Glucose to 238 on admission.  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 She notes that she missed her HD session today. Creatinine of 6.42 on admission.  - Dialysis today    Daily Checklist:  Diet: Regular Diet  DVT PPx: Contradindicated - Thrombocytopenic  Electrolytes: No Repletion Needed  Code Status: Full Code    Chief Concern:   Anal lesion    Subjective:   HPI:  Cristina Fields is a 70 y.o. female with PMHx of ESRD on dialysis, CVA, type 2 DM, and CLL.    She presents  with 4 days of worsening anal pain due to condyloma. It has been present for almost a year but has recently become larger and more painful. It oozes blood and causes her bowel movements to be painful. She does not have difficulty passing stool, however. She is followed by [facility] dermatologist for treatment of lesion. She went to [facility] ED Monday night where she treated with toradol gel to little effect. Yesterday, she was seen by her hematologist where she was also found to be significantly anemic and thrombocytopenic. They advised her to go to the ED for pain crisis and potential transfusion.    She also endorses an episode of diarrhea and vomiting this morning, at which point her husband insisted she be evaluated in the ED. Additionally, she has a non-painful, non-puritic facial rash that has been present for a day. She has had a similar rash before about a month ago that went away on its own.    Currently, she denies fever, headache, chest pain, SOB, abdominal pain, nausea. She has not had another episode of diarrhea or vomiting since this morning.    Designated Healthcare Decision Maker:  Ms. Ramseyer currently has decisional capacity for healthcare decision-making and is able to designate a surrogate healthcare decision maker. Ms. Palen designated healthcare decision maker(s) is/are Polina Burmaster (the patient's spouse) as denoted by stated patient preference.    Allergies:  Lisinopril    Medications:   Prior to Admission medications    Medication Dose, Route, Frequency   amLODIPine (NORVASC) 10 MG tablet 1 tablet, Oral, Daily (standard)   apixaban (ELIQUIS) 5 mg Tab Take by mouth.  Patient not taking: Reported on 01/25/2021   aspirin (ECOTRIN) 81 MG tablet 81 mg, Daily (standard)  Patient not taking: Reported on 01/25/2021   atorvastatin (LIPITOR) 40 MG tablet 40 mg, Oral, Daily (standard)   carvediloL (COREG) 12.5 MG tablet 12.5 mg, Oral, 2 times a day (standard)   hydrALAZINE (APRESOLINE) 10 MG tablet 10 mg, Oral, 3 times a day (standard)   hydrocortisone 1 % cream Apply to affected area 2 times daily  Patient not taking: Reported on 01/25/2021   lidocaine (XYLOCAINE) 5 % ointment Topical, 2 times a day PRN, Apply to rectum topically as needed for pain for up to two times a day    loratadine (CLARITIN) 10 mg tablet 10 mg, Oral, Daily (standard)   losartan (COZAAR) 100 MG tablet 100 mg, Oral, Daily (standard)   methoxy peg-epoetin beta (MIRCERA INJ) 60 mcg  Patient not taking: Reported on 01/25/2021   NIFEdipine (ADALAT CC) 60 MG 24 hr tablet 60 mg, Oral, Daily (standard)   ondansetron (ZOFRAN) 8 MG tablet 1 tablet, Every 8 hours  Patient not taking: Reported on 01/25/2021   oxyCODONE (OXY-IR) 5 mg capsule 2.5 mg, Oral, Every 6 hours PRN   pantoprazole (PROTONIX) 40 MG tablet 40 mg, Daily (standard)  Patient not taking: Reported on 01/25/2021   sertraline (ZOLOFT) 100 MG tablet 100 mg, Oral, Daily (standard)   traMADoL (ULTRAM) 50 mg tablet 50 mg, Oral, Every 6 hours PRN   UNABLE TO FIND Heparin Sodium (Porcine) 1,000 Units/mL Systemic  Patient not taking: Reported on 01/25/2021   zanubrutinib (BRUKINSA) capsule 160 mg, Oral, Daily (standard), Swallow whole with water. Do not open, break, or chew capsule.   zinc oxide 20 % ointment 1 application, As needed (once a day)  Patient not taking: Reported on 01/25/2021       Medical History:  No past medical history on  file.    Surgical History:  No past surgical history on file.    Family History:   No family history on file.    Social History:  The patient lives with family    Social History     Tobacco Use   ??? Smoking status: Not on file   ??? Smokeless tobacco: Not on file   Substance Use Topics   ??? Alcohol use: Not on file   ??? Drug use: Not on file        Review of Systems:  10 systems were reviewed and are negative unless otherwise mentioned in the HPI    Objective:   Physical Exam:  Temp:  [36.9 ??C] 36.9 ??C  Heart Rate:  [90] 90  SpO2 Pulse:  [79] 79  Resp:  [18] 18  BP: (125-142)/(58-79) 142/58  SpO2:  [92 %-95 %] 92 %    Gen: Uncomfortable-appearing female, answers questions appropriately  Eyes: Sclera anicteric, EOMI,  HENT: atraumatic, normocephalic, MMM. OP w/o erythema or exudate   Neck: no cervical lymphadenopathy or thyromegaly, no JVD  Heart: RRR, S1, S2, no M/R/G, no chest wall tenderness  Lungs: CTAB, no crackles or wheezes, no use of accessory muscles  Abdomen: soft, NTND, no rebound/guarding, no hepatosplenomegaly  Extremities: no clubbing, cyanosis, or edema  Rectal Exam: See picture in chart  Neuro: Leftward tongue deviation, mild aphasia  Skin:  Non-blanching red macules present in malar distribution  Psych: Alert, oriented, normal mood and affect.     Labs/Studies/Imaging:  Labs, Studies, Imaging from the last 24hrs per EMR and personally reviewed    I attest that I have reviewed the student note and that the components of the history of the present illness, the physical exam, and the assessment and plan documented were performed by me or were performed in my presence by the student where I verified the documentation and performed (or re-performed) the exam and medical decision making.     Theodis Shove, MD MPH  Internal Medicine, PGY-1

## 2021-01-27 DIAGNOSIS — T451X5A Adverse effect of antineoplastic and immunosuppressive drugs, initial encounter: Secondary | ICD-10-CM | POA: Diagnosis not present

## 2021-01-27 DIAGNOSIS — K629 Disease of anus and rectum, unspecified: Secondary | ICD-10-CM | POA: Diagnosis not present

## 2021-01-27 DIAGNOSIS — C9110 Chronic lymphocytic leukemia of B-cell type not having achieved remission: Secondary | ICD-10-CM | POA: Diagnosis not present

## 2021-01-27 DIAGNOSIS — A630 Anogenital (venereal) warts: Secondary | ICD-10-CM | POA: Diagnosis not present

## 2021-01-27 DIAGNOSIS — D696 Thrombocytopenia, unspecified: Secondary | ICD-10-CM | POA: Diagnosis not present

## 2021-01-27 DIAGNOSIS — Z992 Dependence on renal dialysis: Secondary | ICD-10-CM | POA: Diagnosis not present

## 2021-01-27 DIAGNOSIS — D61810 Antineoplastic chemotherapy induced pancytopenia: Secondary | ICD-10-CM | POA: Diagnosis not present

## 2021-01-27 DIAGNOSIS — D631 Anemia in chronic kidney disease: Secondary | ICD-10-CM | POA: Diagnosis not present

## 2021-01-27 DIAGNOSIS — E119 Type 2 diabetes mellitus without complications: Secondary | ICD-10-CM | POA: Diagnosis not present

## 2021-01-27 DIAGNOSIS — N186 End stage renal disease: Secondary | ICD-10-CM | POA: Diagnosis not present

## 2021-01-27 DIAGNOSIS — I120 Hypertensive chronic kidney disease with stage 5 chronic kidney disease or end stage renal disease: Secondary | ICD-10-CM | POA: Diagnosis not present

## 2021-01-27 DIAGNOSIS — D649 Anemia, unspecified: Secondary | ICD-10-CM | POA: Diagnosis not present

## 2021-01-27 LAB — BASIC METABOLIC PANEL
ANION GAP: 11 mmol/L (ref 5–14)
BLOOD UREA NITROGEN: 59 mg/dL — ABNORMAL HIGH (ref 9–23)
BUN / CREAT RATIO: 8
CALCIUM: 8.4 mg/dL — ABNORMAL LOW (ref 8.7–10.4)
CHLORIDE: 102 mmol/L (ref 98–107)
CO2: 25 mmol/L (ref 20.0–31.0)
CREATININE: 7.6 mg/dL — ABNORMAL HIGH
EGFR CKD-EPI AA FEMALE: 6 mL/min/{1.73_m2} — ABNORMAL LOW (ref >=60)
EGFR CKD-EPI NON-AA FEMALE: 5 mL/min/{1.73_m2} — ABNORMAL LOW (ref >=60)
GLUCOSE RANDOM: 96 mg/dL (ref 70–179)
POTASSIUM: 4.4 mmol/L (ref 3.4–4.8)
SODIUM: 138 mmol/L (ref 135–145)

## 2021-01-27 LAB — CBC
HEMATOCRIT: 21.7 % — ABNORMAL LOW (ref 34.0–44.0)
HEMOGLOBIN: 7.2 g/dL — ABNORMAL LOW (ref 11.3–14.9)
MEAN CORPUSCULAR HEMOGLOBIN CONC: 33.1 g/dL (ref 32.0–36.0)
MEAN CORPUSCULAR HEMOGLOBIN: 28.9 pg (ref 25.9–32.4)
MEAN CORPUSCULAR VOLUME: 87.3 fL (ref 77.6–95.7)
MEAN PLATELET VOLUME: 10.2 fL (ref 6.8–10.7)
PLATELET COUNT: 40 10*9/L — ABNORMAL LOW (ref 150–450)
RED BLOOD CELL COUNT: 2.49 10*12/L — ABNORMAL LOW (ref 3.95–5.13)
RED CELL DISTRIBUTION WIDTH: 15.2 % (ref 12.2–15.2)
WBC ADJUSTED: 4.4 10*9/L (ref 3.6–11.2)

## 2021-01-27 LAB — RETICULOCYTES
RETICULOCYTE ABSOLUTE COUNT: 34.3 10*9/L (ref 23.0–100.0)
RETICULOCYTE COUNT PCT: 1.4 % (ref 0.50–2.17)

## 2021-01-27 LAB — HIV ANTIGEN/ANTIBODY COMBO: HIV ANTIGEN/ANTIBODY COMBO: NONREACTIVE

## 2021-01-27 LAB — IGG: GAMMAGLOBULIN; IGG: 189 mg/dL — ABNORMAL LOW (ref 646–2013)

## 2021-01-27 LAB — PHOSPHORUS: PHOSPHORUS: 6.4 mg/dL — ABNORMAL HIGH (ref 2.4–5.1)

## 2021-01-27 LAB — MAGNESIUM: MAGNESIUM: 1.7 mg/dL (ref 1.6–2.6)

## 2021-01-27 MED ADMIN — insulin lispro (HumaLOG) injection 0-5 Units: 0-5 [IU] | SUBCUTANEOUS | @ 16:00:00

## 2021-01-27 MED ADMIN — HYDROmorphone (PF) (DILAUDID) injection 0.5 mg: .5 mg | INTRAVENOUS | @ 08:00:00 | Stop: 2021-01-27

## 2021-01-27 MED ADMIN — HYDROmorphone (PF) (DILAUDID) injection 0.5 mg: .5 mg | INTRAVENOUS | @ 17:00:00 | Stop: 2021-01-27

## 2021-01-27 NOTE — Unmapped (Signed)
Nephrology ESRD Consultation Note    Requesting Attending Physician :  No att. providers found  Service Requesting Consult : [facility] ([facility])    Reason for consult: ESRD, hemodialysis dependence    Outpatient dialysis unit: [facility]  Outpatient dialysis schedule: TTS    Assessment/Recommendations: Cristina Fields is a 70 y.o. female with past medical history notable for ESRD on HD, CVA w/ residual tongue deviation, and CLL that presented to [facility] with severe rectal pain, N/V, diarrhea, and anemia.    ESRD: 3.5hrs, 3K, 2.5Ca, 137Na, 35Bicarb, 37C. Dialyzer: 180. Receiving heparin 3000 unit bolus    Volume/ hypertension: EDW 50. Will attempt to achieve dry weight if tolerated    Anemia of CKD: Hgb 7.2. Receives Mircera 60 mg q2  weeks, last dose 01/17/21. Will use comparable dose of epogen next Tuesday if she is still in the hospital.     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 Not on any vitamin D analogues.     Vascular access: LUE AV fistula with good thrill.    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   -Implement save arm precautions.  Prefer needle sticks in the dorsum of the hands or wrists.  No blood pressure measurements in arm.  -If blood transfusion is requested during hemodialysis sessions, please alert [place] prior to the session.   -If a hemodialysis catheter line culture is requested, please alert [place] as only hemodialysis nurses are able to collect those specimens.     **Please contact fellow on call PRIOR to hospital discharge so that the nephrology team can arrange appropriate dialysis-related follow-up.**    HPI  Cristina Fields is a 70 y.o. female with PMH of ESRD on HD, CVA w/ residual tongue deviation, and CLL that presented to [facility] with severe rectal pain, N/V, diarrhea, and anemia.    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 She has this lesion for almost a year but has recently become larger and more painful. She had the condyloma shaved in the past, was followed by [facility] dermatologist.   She presented to ED for further pain management and because she was found to be significantly anemic and thrombocytopenic at hem clinic.     She missed her dialysis session yesterday because she presented to ED. She was last dialyzed on Tuesday. Denies any problems during dialysis except that her AVF was infiltrated and they have been using a RIJ cath to dialyze her.   ??  Currently, she denies chest pain, SOB. We will plan to dialyze her tomorrow.       Medications:   Current Facility-Administered Medications   Medication Dose Route Frequency Provider Last Rate Last Admin   ??? acetaminophen (TYLENOL) tablet 1,000 mg  1,000 mg Oral Q8H Dorice Lamas, MD       ??? dextrose 50 % in water (D50W) 50 % solution 12.5 g  12.5 g Intravenous Q10 Min PRN Dorice Lamas, MD       ??? HYDROmorphone (PF) (DILAUDID) injection 0.25 mg  0.25 mg Intravenous Q4H PRN Dorice Lamas, MD        Or   ??? HYDROmorphone (PF) (DILAUDID) injection 0.5 mg  0.5 mg Intravenous Q4H PRN Dorice Lamas, MD   0.5 mg at 01/27/21 0420   ??? insulin lispro (  HumaLOG) injection 0-5 Units  0-5 Units Subcutaneous TID AC Dorice Lamas, MD       ??? naloxone (NARCAN) injection 0.1 mg  0.1 mg Intravenous Q5 Min PRN Dorice Lamas, MD       ??? ondansetron (ZOFRAN) injection 4 mg  4 mg Intravenous Q8H PRN Dorice Lamas, MD       ??? ondansetron (ZOFRAN) tablet 4 mg  4 mg Oral Q8H PRN Dorice Lamas, MD            ALLERGIES  Lisinopril    MEDICAL HISTORY  No past medical history on file.     SOCIAL HISTORY  Social History     Socioeconomic History   ??? Marital status: Not on file     Spouse name: Not on file   ??? Number of children: Not on file   ??? Years of education: Not on file   ??? Highest education level: Not on file   Occupational History   ??? Not on file   Tobacco Use   ??? Smoking status: Not on file   ??? Smokeless tobacco: Not on file   Substance and Sexual Activity   ??? Alcohol use: Not on file   ??? Drug use: Not on file   ??? Sexual activity: Not on file   Other Topics Concern   ??? Not on file   Social History Narrative   ??? Not on file     Social Determinants of Health     Financial Resource Strain: Not on file   Food Insecurity: Not on file   Transportation Needs: Not on file   Physical Activity: Not on file   Stress: Not on file   Social Connections: Not on file        FAMILY HISTORY  No family history on file.     Review of Systems:  A 12 system review of systems was negative except as noted in HPI.    Physical Exam:  Vitals:    01/27/21 0420   BP: 126/100   Pulse: 85   Resp: 18   Temp: 36.8 ??C   SpO2: 100%     General: She seems uncomfortable and in pain, no acute distress  HEENT: anicteric sclera, MMM  CV: RRR, trace edema  Lungs: CTAB, normal wob  Abd: soft, non-tender, non-distended  Psych: alert, engaged, appropriate mood and affect  Neuro: normal speech, no gross focal deficits     Test Results  Reviewed  Lab Results   Component Value Date    NA 138 01/27/2021    K 4.4 01/27/2021    CL 102 01/27/2021    CO2 25.0 01/27/2021    BUN 59 (H) 01/27/2021    CREATININE 7.60 (H) 01/27/2021    GLU 96 01/27/2021    CALCIUM 8.4 (L) 01/27/2021    ALBUMIN 3.1 (L) 01/26/2021

## 2021-01-27 NOTE — Unmapped (Signed)
Pt A&Ox4.  VSS, afebrile.  Pt provided PRN dilaudid 1x w/ relief.  Admission completed and pt oriented to care setting.  Pt denies N/V.  Pt reporting night sweats but reports she experiences them at home.  Provider notified.  No falls or acute events during shift.  WCTM.    Problem: Adult Inpatient Plan of Care  Goal: Plan of Care Review  Outcome: Progressing  Goal: Patient-Specific Goal (Individualized)  Outcome: Progressing  Goal: Absence of Hospital-Acquired Illness or Injury  Outcome: Progressing  Intervention: Identify and Manage Fall Risk  Recent Flowsheet Documentation  Taken 01/27/2021 0020 by Heriberto Antigua, RN  Safety Interventions:  ??? lighting adjusted for tasks/safety  ??? low bed  ??? nonskid shoes/slippers when out of bed  ??? fall reduction program maintained  Intervention: Prevent Skin Injury  Recent Flowsheet Documentation  Taken 01/27/2021 0020 by Heriberto Antigua, RN  Skin Protection: adhesive use limited  Intervention: Prevent and Manage VTE (Venous Thromboembolism) Risk  Recent Flowsheet Documentation  Taken 01/27/2021 0020 by Heriberto Antigua, RN  Activity Management: activity adjusted per tolerance  Goal: Optimal Comfort and Wellbeing  Outcome: Progressing  Goal: Readiness for Transition of Care  Outcome: Progressing  Goal: Rounds/Family Conference  Outcome: Progressing     Problem: Self-Care Deficit  Goal: Improved Ability to Complete Activities of Daily Living  Outcome: Progressing

## 2021-01-27 NOTE — Unmapped (Signed)
Inpatient Consult Note    Requesting Attending Physician :  No att. 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    #. CLL  Patient tried different chemotherapies regimens in the past. She was started on zanabrutinib on Jan 2022 due to symptomatic CLL (drenching sweats), anemia and lymphocytosis. Patient responded well with resolution of leucocytosis/lymphocytosis but worsening anemia and thrombocytopenia. 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 Therefore, we recommend to stop zanabrutinib and follow-up with primary hematologist, Dr Lonni Fix as outpatient.    #. Condyloma  Pain control, biopsy and management by the primary team.    Recommendations:  - Hold zanabrutinib  -Transfusion to keep Hb>7 or Pl>10  -Check immunoglobulin levels  -Follow up as outpatient with Dr Lonni Fix    Patient was seen and examined with attending, Dr Leeanne Deed.  Oswaldo Conroy, MD MS  PGY-4 Hematology Oncology      ___________________________________________________________________    Reason for Consult:   Pt was seen at the request of No att. providers found ([facility])) for assistance in management of CLL      HPI     70 yo F hx ESRD on HD and CLL who was admitted due to sacral pain associated to large sacral exophytic mass.    Patient with history of large sacral exophytic mass for the last year, followed by dermatology at the [facility], developed worsening pain in the sacral area for the last 4 days in the area of the sacral mass.    Regarding CLL, she was diagnosed in 2016 treated with several regimens including FCR, BR, ibrutinib, Chlorambucil. She was started on zanabrutinib due to history of ESRD on Jan 2022.  She required treatment at that point due to drenching sweats, leucocytosis/lymphocytosis and anemia.     Allergies:  Allergies   Allergen Reactions   ??? Lisinopril Cough and Other (See Comments)     Other reaction(s): Unknown         Current Hospital Medications:     Scheduled Meds:  ??? acetaminophen  1,000 mg Oral Q8H   ??? insulin lispro  0-5 Units Subcutaneous TID AC     Continuous Infusions:  PRN Meds:dextrose in water, HYDROmorphone **OR** HYDROmorphone, naloxone, ondansetron, ondansetron      PAST MEDICAL HISTORY     No past medical history on file.      Oncological History:  Oncology History Overview Note   Prior therapy:  FCR x6 at [facility] beginning in 06/2011 for rapid LDT, attained a CR     Relapse 05/2014     09/22/2015 bendamustine (90 mg/m2) began (no Ritux first cycle)  10/20/2015: BR, severe infusion reaction prompting MICU stay  11/17/15 BR, benda reduced to 70 mg/m2, ritux tolerated  01/2016 - 6th cycle BR completed   03/2016 PET consistent with response     Relapse in 2018     02/20/18 ibrutinib 140 mg daily started (dose rec by pharmacy due to CKD)     04/11/20 ibrutinib held when pt admitted for acute cerebellar infarct     04/26/20 admitted for ritux re-initiation with plan for 4 weeks in setting of rapid relapse (WBC up to 88K, PET not concerning for transformation)     05/10/20 chlorambucil started  07/12/20 cycle 3 ritux - cycle 4 declined     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     09/21/2020 Biopsy      (Outside case: [id number], dated 09/21/2020)  Lymph node, left cervical, image-guided needle core biopsy  -  Chronic lymphocytic leukemia/small lymphocytic lymphoma (see Comment)  -  Negative for large cell transformation in the sampled core.         11/24/2020 -  Chemotherapy    Zanubrutinib 160 mg ONCE daily           PAST SURGICAL HISTORY     No past surgical history on file.      SOCIAL HISTORY      Social History     Socioeconomic History   ??? Marital status: Not on file     Spouse name: Not on file   ??? Number of children: Not on file   ??? Years of education: Not on file   ??? Highest education level: Not on file   Occupational History   ??? Not on file   Tobacco Use   ??? Smoking status: Not on file   ??? Smokeless tobacco: Not on file   Substance and Sexual Activity   ??? Alcohol use: Not on file   ??? Drug use: Not on file   ??? Sexual activity: Not on file   Other Topics Concern   ??? Not on file   Social History Narrative   ??? Not on file     Social Determinants of Health     Financial Resource Strain: Not on file   Food Insecurity: Not on file   Transportation Needs: Not on file   Physical Activity: Not on file   Stress: Not on file   Social Connections: Not on file         FAMILY HISTORY     No family history on file.      Review of Systems:     10 systems reviewed and are negative unless otherwise mentioned in HPI      PHYSICAL EXAM     Vitals:    01/26/21 1651 01/26/21 1905 01/26/21 2211 01/27/21 0420   BP: 142/58 159/55 146/60 126/100   Pulse:  87 85 85   Resp:  18 18 18    Temp:  36.9 ??C (98.4 ??F) 36.8 ??C (98.2 ??F) 36.8 ??C (98.2 ??F)   TempSrc:  Oral     SpO2: 92% 98% 100% 100%   Weight:   51.3 kg (113 lb 1.5 oz)    Height:   149.9 cm (4' 11)        General appearance: appears older than stated age  Throat: lips, mucosa, and tongue normal; teeth and gums normal  Neck: no adenopathy and supple, symmetrical, trachea midline  Lungs: clear to auscultation bilaterally  Heart: regular rate  and rhythm, S1, S2 normal, no murmur, click, rub or gallop  Abdomen: soft, non-tender; bowel sounds normal; no masses,  no organomegaly  Pulses: 2+ and symmetric.  Lymph nodes: Cervical, supraclavicular, and axillary nodes normal.  Neurologic: Grossly normal      RESULTS       Recent Labs     01/25/21  1516 01/26/21  1310 01/27/21  0640   WBC 4.7 4.1 4.4   RBC 2.50* 2.62* 2.49*   HGB 7.3* 7.7* 7.2*   HCT 21.8* 23.0* 21.7*   MCV 87.1 87.7 87.3   MCH 29.2 29.5 28.9   RDW 15.4* 15.5* 15.2   PLT 50* 48* 40*   MPV 10.6 10.7 10.2   MONOPCT 7.7 6.6  --    EOSPCT 1.4 1.1  --    BASOPCT 1.2 1.1  --          Recent Labs     01/25/21  1516 01/26/21  1310   GLU 153 238*   NA 137 137   K 3.6 3.8   CL 102 101   CO2 26.0 25.0   BUN 34* 48*   CREATININE 4.91* 6.42*   CALCIUM 8.1* 8.5*   PROT 5.5* 5.4*   ALBUMIN 3.2* 3.1*   AST 21 23   ALT 18 15

## 2021-01-27 NOTE — Unmapped (Cosign Needed)
Received page from primary team regarding patient with extensive anal condyloma. Recommended colorectal surgery involvement as treatment is likely surgical and would require anesthesia and OR setting.      Theora Gianotti, MD  PGY-4 Resident Physician  [facility] Department of Dermatology

## 2021-01-27 NOTE — Unmapped (Shared)
Internal Medicine (MEDL) Progress Note    Assessment & Plan:   Cristina Fields is a 70 y.o. female with a PMHx of ESRD on dialysis, type 2 diabetes, CLL on zanubrutinib, and anal condyloma that presented to [facility] with severe anal pain, anemia, diarrhea, and vomiting.    Principal Problem:    Anal lesion  Active Problems:    Condyloma    Other specified anemias    Thrombocytopenia (CMS-HCC)    Type 2 diabetes mellitus without complication, without long-term current use of insulin (CMS-HCC)    CKD (chronic kidney disease) requiring chronic dialysis (CMS-HCC)  Resolved Problems:    * No resolved hospital problems. *      Rectal pain - Exophytic anal lesion likely Condyloma - improving  Patient is followed by [facility] dermatology for lesion. Has been present for ~1 year (since before her CLL diagnosis). Last shaved by derm two months ago. Experienced acute worsening of pain over last 4 days. Appearance is consistent with condyloma which makes sense in setting of immunocompromise due to advanced CLL. Lesion has slight bleeding (she estimates 1Tbl) but no  - PO dilauded PRN with IV for breakthrough  - Follow-up colorectal surgery recommendations  - Follow-up with palliative care as outpatient  ??  Diarrhea and N/V 2/2 pain vs infectious diarrhea - improving  Improved today. No more diarrhea, nausea, or vomiting since admission. Given immunocompromise there is concern for infectious process but could also be sequelae of severe pain. Concern for obstruction is low as patient endorses no difficulty passing BM although mass is painful to pass stool through. Unclear if N/V is caused by pain or if pain is caused by diarrhea. No fevers/chills/leukocytosis and abdomen soft, given new diarrhea of unclear cause with N/V in this immunocompromised woman, we will evaluate for infectious causes of her diarrhea.   - GIPP/C Diff/CMV  - Ondansetron IV/PO PRN  ??  Anemia (BL 9?) - Thrombocytopenia (BL50s?) - CLL  In setting of advanced CLL, this could be due to any combination of a number of factors including bone marrow infiltration, autoimmune destruction, splenic sequestration, or drug toxicities. Her hemoglobin and platelets have been trending down since January 2021 but have been relatively stable for the last month. However, in setting of oozing exophytic anal mass there is concern for blood loss anemia as well.  - HOLD Zanubrutinib (Husband gave patient last dose 4/7 PM)  - Transfuse below Hgb 7 or if symptomatic  - Daily CBC  - Follow-up reticulocyte count  - Follow-up immunoglobulin levels  ??  Petechial Rash on Face   See picture in chart. One day history. No similar rashes in the past. Possibly drug related vs trauma although reports no head trauma to our team.   - f/u with oncology given cf drug reaction  ??  Type II diabetes  Not currently on home regimen. Glucose to 238 on admission.  - Sensitive sliding scale insulin given impaired kidney function  - Check A1c  ??  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 She notes that she missed her HD session today. Creatinine of 6.42 on admission. EDW 50.  - Dialysis today    Daily Checklist:  Diet: Regular Diet  DVT PPx: Contradindicated - Thrombocytopenic  Electrolytes: No Repletion Needed  Code Status: Full Code  Dispo: Goal Discharge: home by 4/10    Team Contact Information:   Primary Team: Internal Medicine (MEDL)  Primary Resident: Marrianne Mood, MS4  Resident's  Pager: [phone] (Gen Med[facility] Intern - [place]on Asters)    Interval History:   No acute events overnight. Tolerating PO well without any nausea or vomiting.  Denies BM in past 18 hours.  Pain is 4/10 and under much better control this morning.    ROS: Denies headache, chest pain, shortness of breath, abdominal pain, nausea, vomiting.    Objective:   Temp:  [36.8 ??C (98.2 ??F)-37 ??C (98.6 ??F)] 37 ??C (98.6 ??F)  Heart Rate:  [84-87] 84  SpO2 Pulse:  [79] 79  Resp:  [18] 18  BP: (126-161)/(52-100) 161/52  SpO2:  [92 %-100 %] 93 %    Gen: WDWN female in NAD, answers questions appropriately  Eyes: sclera anicteric, EOMI  HENT: atraumatic, MMM, OP w/o erythema or exudate   Heart: RRR, S1, S2, no M/R/G, no chest wall tenderness  Lungs: CTAB, no crackles or wheezes, no use of accessory muscles  Abdomen: Normoactive bowel sounds, soft, NTND, no rebound/guarding  Extremities: no clubbing, cyanosis, or edema in the BLEs  Skin: petechial/purpuric rash in malar distribution on face; anal lesion as noted previously  Psych: Alert, oriented, appropriate mood and affect    Labs/Studies: Labs and Studies from the last 24hrs per EMR and Reviewed

## 2021-01-27 NOTE — Unmapped (Cosign Needed)
New Colorectal Surgery Consult Note      Requesting Attending Physician:  No att. providers found  Service Requesting Consult:  [facility]  Service Providing Consult: Colorectal Surgery  Consulting Attending: Dr. Neysa Hotter    Assessment:  Cristina Fields is a 70 y.o. 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 They have recurred very quickly and are extensive. We unfortunately do not have access to her previous pathology reports or operative reports because she's received most of her care at the [place]. Given how quickly and aggressively this recurred, as well as the lack of pathology, we are concerned that this could contain anal cancer. We need to rule this out prior to proceeding with any definitive management.     Recommendations:   - MRI pelvis with and without contrast - anal cancer protocol  - Will post for anorectal EUA and biopsy for Monday  - NPO at midnight the night before surgery  - Please order CBC and type and screen for Monday morning  - Please have 1 unit RBC and 1 unit of platelets prepped for Monday morning    This patient was seen by and discussed with chief resident, Dr. Valene Bors and attending, Dr. Lind Covert who agree with the above assessment and plan.    Thank for letting us be involved in the care of this patient. The Colorectal Surgery team will continue to follow. If any questions or concerns arise please page the [facility] Consult pager.    Shiela Mayer, MD  [facility] General Surgery PGY2  P# [phone]      History of Present Illness:     Cristina Fields is seen in consultation for surgical excision of anal condyloma at the request of No att. providers found on the [facility] service.     Cristina Fields is a 70 y.o. female with medical history noted below who has had an anal lesion that was previously diagnosed as anal condyloma for over a year. She has a history of inguinal lesions after well but has had vaginal sparing. Her anal lesion was treated with excision and fulguration at the [place] in June 2021. She reports after the procedure the lesion was gone but it started forming bumps shortly after that. Since then it has recurred and grown even bigger than previously. This occurred in the setting of being off chemotherapy for CLL during the fall and a CLL flare in the winter. She saw a colorectal surgeon at [facility] who prescribed imiquimod and felt she would need either a staged surgery or a combination of radiation and surgery. His exam was notable for a large external lesion with extension of the lesion all the way to the distal anal canal. She did not start the imiquimod unfortunately. She reports that she hasn't used any treatment for the lesion apart from the surgery.     1 week ago she started having severe pain from the lesion. It was worse with any sort of touch or rubbing. The pain did not shoot, radiate or get worse with bowel movements. She continued to have regular, daily bowel movements that were soft until yesterday when she had 3 large episodes of diarrhea. She did not have any blood with bowel movements but endorses having some blood with wiping. She denies any recent weight loss, fecal incontinence or drainage,  new lymphadenopathy, nausea, vomiting, dysuria or abdominal pain.     PMH:   - CLL: on Zanubritinib, most recent flare in January after being off chemo for several months. Has been on numerous therapies in the past with multiple flares   - ESRD on HD via R tunneled cath, previously on PD  - HTN: on Norvasc, Coreg, Hydralazine, Losartan, Nifedipine  - T2DM: last A1c 5.6 in 01/06/21  - Stroke: thought to be 2/2 previous chemo. Previously on Eliquis, now stopped since starting     PSH:   - C-section x3 via low anterior midline incision  - PD cath placement  - PD cath removal    Medication:  Current Facility-Administered Medications   Medication Dose Route Frequency Provider Last Rate Last Admin   ??? acetaminophen (TYLENOL) tablet 1,000 mg  1,000 mg Oral Q8H Dorice Lamas, MD       ??? dextrose 50 % in water (D50W) 50 % solution 12.5 g  12.5 g Intravenous Q10 Min PRN Dorice Lamas, MD       ??? HYDROmorphone (PF) (DILAUDID) injection 0.25 mg  0.25 mg Intravenous Q4H PRN Dorice Lamas, MD        Or   ??? HYDROmorphone (PF) (DILAUDID) injection 0.5 mg  0.5 mg Intravenous Q4H PRN Dorice Lamas, MD   0.5 mg at 01/27/21 0420   ??? insulin lispro (HumaLOG) injection 0-5 Units  0-5 Units Subcutaneous TID AC Dorice Lamas, MD       ??? naloxone (NARCAN) injection 0.1 mg  0.1 mg Intravenous Q5 Min PRN Dorice Lamas, MD       ??? ondansetron (ZOFRAN) injection 4 mg  4 mg Intravenous Q8H PRN Dorice Lamas, MD       ??? ondansetron (ZOFRAN) tablet 4 mg  4 mg Oral Q8H PRN Dorice Lamas, MD           Allergies   Allergen Reactions   ??? Lisinopril Cough and Other (See Comments)     Other reaction(s): Unknown       Social History:  Social History     Tobacco Use   ??? Smoking status: Not on file   ??? Smokeless tobacco: Not on file   Substance Use Topics   ??? Alcohol use: Not on file   ??? Drug use: Not on file       No family history on file.    Review of Systems  10 systems were reviewed and are negative except as noted specifically in the HPI.    Objective:   BP 126/100  - Pulse 85  - Temp 36.8 ??C  - Resp 18  - Ht 149.9 cm (4' 11)  - Wt 51.3 kg (113 lb 1.5 oz)  - SpO2 100%  - BMI 22.84 kg/m??     Intake/Output last 3 shifts:  No intake/output data recorded.    Physical Exam:  Vitals:    01/27/21 0420   BP: 126/100   Pulse: 85   Resp: 18   Temp: 36.8 ??C   SpO2: 100%      General: Elderly woman, in bed, no acute distress, appears tired  Neuro: AAO x 3, appropriate to questions,   HEENT: normocephalic  Cardiac: RRR  Lungs: Easy work of breathing on 2L [place]  Abdomen: Soft, non-distended, non tender, with well healed vertical lower midline scar and scars from PD cath.  Anorectal: Extensive verrucous plaque like lesion that measures 15cm by 8cm. Foul  smelling with drainage on a pad. Lesion extends to the anal opening and appear to extend into the anal canal but could not confirm as DRE was not tolerated 2/2 pain. Lesions extend anteriorly to the vagina.  MSK: Moving all 4 extremities against gravity  Skin: WWP  Extremities: No edema    Most Recent Labs:  Lab Results   Component Value Date    WBC 4.4 01/27/2021    HGB 7.2 (L) 01/27/2021    HCT 21.7 (L) 01/27/2021    PLT 40 (L) 01/27/2021       Lab Results   Component Value Date    NA 138 01/27/2021    K 4.4 01/27/2021    CL 102 01/27/2021    CO2 25.0 01/27/2021    BUN 59 (H) 01/27/2021    CREATININE 7.60 (H) 01/27/2021    CALCIUM 8.4 (L) 01/27/2021    MG 1.7 01/27/2021       IMAGING:  ECG 12 Lead    Result Date: 01/27/2021  NORMAL SINUS RHYTHM POSSIBLE LEFT ATRIAL ENLARGEMENT POOR R WAVE PROGRESSION IN ANTERIOR PRECORDIAL LEADS NON-SPECIFIC ST/T WAVE CHANGES ABNORMAL ECG NO PREVIOUS ECGS AVAILABLE Confirmed by Christella Noa ([id number]) on 01/27/2021 6:26:56 AM

## 2021-01-27 NOTE — Unmapped (Addendum)
Adult Nutrition Assessment Note    Visit Type: RN Consult  Reason for Visit: Per Admission Nutrition Screen (Adult), Have you had a decrease in food intake or appetite?    HPI & PMH:  Cristina Fields is a 70 y.o. female with PMHx of CKD on dialysis, CVA w/ residual tongue deviation, and CLL that presented to [facility] with severe rectal pain, N/V, diarrhea, and anemia.    Anthropometric Data:  Height: 149.9 cm (4' 11)   Admission weight: 51.3 kg (113 lb)  Last recorded weight: 51.3 kg (113 lb 1.5 oz)  IBW: 45.45 kg  Percent IBW: 112.87 %  BMI: Body mass index is 22.84 kg/m??.   Usual Body Weight: Unable to obtain at this time     Weight history prior to admission: Patient reports no significant weight changes PTA, consistent per documentation.   Wt Readings from Last 10 Encounters:   01/26/21 51.3 kg (113 lb 1.5 oz)   01/25/21 51.6 kg (113 lb 12.8 oz)   12/26/20 52 kg (114 lb 11.2 oz)   12/07/20 50.7 kg (111 lb 11.2 oz)   11/04/20 49.9 kg (110 lb)      Care Everywhere:   01/06/21 49.9 kg (110 lb)     Weight changes this admission:   Last 5 Recorded Weights    01/26/21 1137 01/26/21 2211   Weight: 51.3 kg (113 lb) 51.3 kg (113 lb 1.5 oz)        Nutrition Focused Physical Exam:  Unable to complete at this time due to patient availability (eating breakfast)       NUTRITIONALLY RELEVANT DATA     Medications:   Nutritionally pertinent medications reviewed and evaluated for potential food and/or medication interactions and include lispro sliding scale insulin AC/HS and zofran prn     Labs:   Nutritionally pertinent labs reviewed and include K+: 3.6/3.8/4.4 mmol/L and Glucose: 238/90/96/95 mg/dL    Nutrition History:   January 27, 2021: Prior to admission: Patient reports eating less than usual for the past 5 weeks due to decreased appetite. Endorses N/V/D one day before admission. Does not endorse constipation, abdominal pain, or chewing/swallowing difficulty. Patient states she has been drinking 1 Novasource daily since September 2021. Patient admitted for 0 days. Patient eating her first hospital meal at time of visit, reports having an improved appetite. Agreeable to oral nutrition supplement.     Allergies, Intolerances, Sensitivities, and/or Cultural/Religious Dietary Restrictions: none identified per chart review at this time     Current Nutrition:  Oral intake        Nutrition Orders   (From admission, onward)             Start     Ordered    01/26/21 1638  Nutrition Therapy Regular/House  Effective now        Question:  Nutrition Therapy:  Answer:  Regular/House    01/26/21 1637                 Nutritional Needs:   Healthy balance of carbohydrate, protein, and fat.       Malnutrition assessment not yet completed at this time due to inability to complete nutrition focused physical exam (NFPE).    GOALS and EVALUATION     ??? Patient to consume 75% or greater of po intake via combination of meals, snacks, and/or oral supplements within 7 days.  - New    Motivation, Barriers, and Compliance:  Evaluation of motivation, barriers, and compliance completed. No  concerns identified at this time.     NUTRITION ASSESSMENT     ??? Current nutrition therapy is appropriate and progressing toward meeting meeting nutritional needs at this time.   ??? Patient would benefit from start of oral supplement to better meet nutritional needs. Reduced electrolytes oral supplement appropriate in setting of K (3.6/3.8/4.4) trending up without active repletion, ESRD on HD.   ??? Glucose (238/90/96/95) trended down to normal limits without insulin. Insulin regimen ordered but not given per Meds History.       Discharge Planning:   Monitor for potential discharge needs with multi-disciplinary team.       NUTRITION INTERVENTIONS and RECOMMENDATION     1. Continue current diet: Regular  2. Added Nepro 1x daily  3. Encourage PO intake   4. Insulin regimen for glucose management prn   5. Weekly weights     Follow-Up Parameters:   1-2 times per week (and more frequent as indicated)    Tobie Lords, MPH, RD, LDN  Pager # [phone]

## 2021-01-28 DIAGNOSIS — D631 Anemia in chronic kidney disease: Secondary | ICD-10-CM | POA: Diagnosis not present

## 2021-01-28 DIAGNOSIS — E119 Type 2 diabetes mellitus without complications: Secondary | ICD-10-CM | POA: Diagnosis not present

## 2021-01-28 DIAGNOSIS — D649 Anemia, unspecified: Secondary | ICD-10-CM | POA: Diagnosis not present

## 2021-01-28 DIAGNOSIS — D696 Thrombocytopenia, unspecified: Secondary | ICD-10-CM | POA: Diagnosis not present

## 2021-01-28 DIAGNOSIS — K629 Disease of anus and rectum, unspecified: Secondary | ICD-10-CM | POA: Diagnosis not present

## 2021-01-28 DIAGNOSIS — N186 End stage renal disease: Secondary | ICD-10-CM | POA: Diagnosis not present

## 2021-01-28 DIAGNOSIS — Z992 Dependence on renal dialysis: Secondary | ICD-10-CM | POA: Diagnosis not present

## 2021-01-28 LAB — CBC
HEMATOCRIT: 21.5 % — ABNORMAL LOW (ref 34.0–44.0)
HEMOGLOBIN: 7 g/dL — ABNORMAL LOW (ref 11.3–14.9)
MEAN CORPUSCULAR HEMOGLOBIN CONC: 32.7 g/dL (ref 32.0–36.0)
MEAN CORPUSCULAR HEMOGLOBIN: 28 pg (ref 25.9–32.4)
MEAN CORPUSCULAR VOLUME: 85.7 fL (ref 77.6–95.7)
MEAN PLATELET VOLUME: 10.8 fL — ABNORMAL HIGH (ref 6.8–10.7)
PLATELET COUNT: 45 10*9/L — ABNORMAL LOW (ref 150–450)
RED BLOOD CELL COUNT: 2.51 10*12/L — ABNORMAL LOW (ref 3.95–5.13)
RED CELL DISTRIBUTION WIDTH: 15.7 % — ABNORMAL HIGH (ref 12.2–15.2)
WBC ADJUSTED: 5.1 10*9/L (ref 3.6–11.2)

## 2021-01-28 LAB — BASIC METABOLIC PANEL
ANION GAP: 13 mmol/L (ref 5–14)
BLOOD UREA NITROGEN: 73 mg/dL — ABNORMAL HIGH (ref 9–23)
BUN / CREAT RATIO: 8
CALCIUM: 8 mg/dL — ABNORMAL LOW (ref 8.7–10.4)
CHLORIDE: 103 mmol/L (ref 98–107)
CO2: 23 mmol/L (ref 20.0–31.0)
CREATININE: 9.05 mg/dL — ABNORMAL HIGH
EGFR CKD-EPI AA FEMALE: 5 mL/min/{1.73_m2} — ABNORMAL LOW (ref >=60)
EGFR CKD-EPI NON-AA FEMALE: 4 mL/min/{1.73_m2} — ABNORMAL LOW (ref >=60)
GLUCOSE RANDOM: 96 mg/dL (ref 70–179)
POTASSIUM: 4.9 mmol/L — ABNORMAL HIGH (ref 3.4–4.8)
SODIUM: 139 mmol/L (ref 135–145)

## 2021-01-28 LAB — IRON PANEL
IRON SATURATION: 9 %
IRON: 16 ug/dL — ABNORMAL LOW
TOTAL IRON BINDING CAPACITY: 188 ug/dL — ABNORMAL LOW (ref 250–425)

## 2021-01-28 LAB — MAGNESIUM: MAGNESIUM: 1.7 mg/dL (ref 1.6–2.6)

## 2021-01-28 LAB — PHOSPHORUS: PHOSPHORUS: 7.1 mg/dL — ABNORMAL HIGH (ref 2.4–5.1)

## 2021-01-28 LAB — FERRITIN: FERRITIN: 1072.7 ng/mL — ABNORMAL HIGH

## 2021-01-28 MED ADMIN — losartan (COZAAR) tablet 100 mg: 100 mg | ORAL | @ 19:00:00

## 2021-01-28 MED ADMIN — HYDROmorphone (DILAUDID) tablet 2 mg: 2 mg | ORAL | @ 15:00:00 | Stop: 2021-02-10

## 2021-01-28 MED ADMIN — acetaminophen (TYLENOL) tablet 1,000 mg: 1000 mg | ORAL | @ 19:00:00

## 2021-01-28 MED ADMIN — HYDROmorphone (DILAUDID) tablet 2 mg: 2 mg | ORAL | Stop: 2021-02-10

## 2021-01-28 NOTE — Unmapped (Signed)
Internal Medicine (MEDL) Progress Note    Assessment & Plan:   Cristina Fields is a 70 y.o. 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 *      Rectal pain - Exophytic anal lesion likely Condyloma - improving  Patient is followed by [facility] dermatology for lesion. Has been present for ~1 year (since before her CLL diagnosis). Last shaved by derm two months ago. Experienced acute worsening of pain over last 4 days. Appearance is consistent with condyloma which makes sense in setting of immunocompromise due to advanced CLL. Lesion has slight bleeding (she estimates 1Tbl) but no  - PO dilauded PRN with IV for breakthrough  - colorectal surgery c/s, planning fof anorectal EUA and biopsy 4/11  - f/u MRI pelvis  - Follow-up with palliative care as outpatient     Diarrhea and N/V 2/2 pain vs infectious diarrhea - improving: denies any abdominal pain currentlyt, is hungry and hasn't eaten all day yet. No more diarrhea.    - cancelling stool studies given resolution  - Ondansetron IV/PO PRN     Anemia (BL 9?) - Thrombocytopenia (BL50s?) - CLL  In setting of advanced CLL, this could be due to any combination of a number of factors including bone marrow infiltration, autoimmune destruction, splenic sequestration, or drug toxicities. Her hemoglobin and platelets have been trending down since January 2021 but have been relatively stable for the last month. However, in setting of oozing exophytic anal mass there is concern for blood loss anemia as well.  - HOLD Zanubrutinib (Husband gave patient last dose 4/7 PM), per onc  - Transfuse below Hgb 7 or if symptomatic  - Daily CBC  - Follow-up reticulocyte count     Petechial Rash on Face (stable): See picture in chart. One day history. No similar rashes in the past. Possibly drug related vs trauma although reports no head trauma to our team. Suspect related to Zanubritinib as a frequent side effect. Onc's last note agrees. CTM     Type II diabetes Not currently on home regimen.   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 She notes that she missed her HD session today. Creatinine of 6.42 on admission. EDW 50. Usually TTHS(?)  - nephro following   - due for next mircera on 4/12 if still in hospital (will plan to give comparable dose of epogen)    Daily Checklist:  Diet: Regular Diet  DVT PPx: Contradindicated - Thrombocytopenic  Electrolytes: No Repletion Needed  Code Status: Full Code  Dispo: pending consultant evaluation of anal condyloma    Team Contact Information:   Primary Team: Internal Medicine (MEDL)  Primary Resident: Marrianne Mood [id number]  Resident's Pager: [phone] (Gen MedL Intern - Cliffton Asters)    Interval History:   NAEO. Patient went to dialysis this morning. Feels weak  and hungry, hasn't eaten anything yet today. Understands that there is a possibility her rectal mass represents a malignancy. Is eager to get a diagnosis     ROS: Denies headache, chest pain, shortness of breath, abdominal pain, nausea, vomiting.    Objective:   Temp:  [36.6 ??C-37.4 ??C] 36.6 ??C  Heart Rate:  [71-89] 85  Resp:  [16-18] 16  BP: (118-181)/(64-79) 176/67  SpO2:  [92 %-96 %] 96 %    Gen: WDWN female in NAD, answers questions appropriately, lying in bed  Eyes: sclera anicteric  HENT: atraumatic, MMM, OP w/o erythema or exudate   Lungs: normal work of breathing on room air Abdomen: Normoactive bowel sounds, soft, NTND, no rebound/guarding  Extremities: no clubbing, cyanosis, or edema in the BLEs  Skin: petechial/purpuric rash mostly around R eye, some in T zone  Psych: Alert, oriented, appropriate mood and affect    Labs/Studies: Labs and Studies from the last 24hrs per EMR and Reviewed

## 2021-01-28 NOTE — Unmapped (Signed)
Problem: Adult Inpatient Plan of Care  Goal: Plan of Care Review  Outcome: Progressing  Goal: Patient-Specific Goal (Individualized)  Outcome: Progressing  Goal: Absence of Hospital-Acquired Illness or Injury  Outcome: Progressing  Intervention: Identify and Manage Fall Risk  Recent Flowsheet Documentation  Taken 01/28/2021 0400 by Michelle Nasuti, RN  Safety Interventions:   bed alarm   fall reduction program maintained   family at bedside   low bed   lighting adjusted for tasks/safety   nonskid shoes/slippers when out of bed  Intervention: Prevent Skin Injury  Recent Flowsheet Documentation  Taken 01/28/2021 0400 by Michelle Nasuti, RN  Skin Protection: adhesive use limited  Taken 01/27/2021 2000 by Michelle Nasuti, RN  Skin Protection: adhesive use limited  Intervention: Prevent and Manage VTE (Venous Thromboembolism) Risk  Recent Flowsheet Documentation  Taken 01/28/2021 0400 by Michelle Nasuti, RN  Activity Management: ambulated to bathroom  Taken 01/27/2021 2000 by Michelle Nasuti, RN  Activity Management: ambulated to bathroom  Goal: Optimal Comfort and Wellbeing  Outcome: Progressing  Goal: Readiness for Transition of Care  Outcome: Progressing  Goal: Rounds/Family Conference  Outcome: Progressing     Problem: Self-Care Deficit  Goal: Improved Ability to Complete Activities of Daily Living  Outcome: Progressing     Problem: Infection  Goal: Absence of Infection Signs and Symptoms  Outcome: Progressing  Intervention: Prevent or Manage Infection  Recent Flowsheet Documentation  Taken 01/28/2021 0400 by Michelle Nasuti, RN  Isolation Precautions: enteric precautions maintained

## 2021-01-28 NOTE — Unmapped (Signed)
MRI screening form needs to be answered completely with all surgeries listed. The patient needs to be in a gown and all medication patches need to be removed. IV should be med locked or have 75ft of extension.     If patient is unable to answer screening form questions, please contact immediate family member. If you have any questions, please call [phone]. Thanks!

## 2021-01-28 NOTE — Unmapped (Signed)
Follow Up Visit Note    Patient Name: Cristina Fields  Patient Age: 70 y.o.  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 female who presents for follow up of relapsed/refractory CLL.    Therapy: zanubrutinib 160mg  ONCE daily - chosen as safest option with dialysis and PPI use. See 1/14 note for discussion of treatment options.  Treatment indication: significant drenching night sweats, anemia and thrombocytopenia.    She has now been on zanubrutinib for 2 months. Her anemia and thrombocytopenia have deepened, possibly related to the zanubrutinib but her CKD is a likely a factor as well.    Her most acute concern is uncontrolled pain due a significant perirectal condyloma. She reports she was treated/released from [facility] ED but does not have follow up for 1 month.    I recommended she be evaluated in the ED for her uncontrolled pain but she and her husband have decided to go home for the night - he needs his cardiac medications. I also recommended a blood transfusion. This can be done when she returns to the ED. I will discuss the worsening cytopenias with Dr. Lonni Fix.      Plans and Recommendations:  1. CLL  - continue zanubritunib ONCE daily  - RTC with labs in 4 weeks    2. Anemia/thrombocytopenia - multifactorial - CLL and renal failure. Required transfusions prior to treatment  - recommend a blood transfusion in the next couple of days - see assessment notes above    3. Uncontrolled pain due to perirectal condyloma: [facility], but no follow up for a month   - recommend evaluation in ED    4. Rash, likely drug-related: facial rash developed 1 week after start of zanubrutinib  - improving  - continue hydrocortisone cream PRN    5. HTN: recommended she follow up with her nephrologist    6. Skin nodules - subcutaneous nodules in bilateral legs, primarily noted early March  -unclear etiology  - 4/6 - reports nodules resolved without intervention    6. Health maintenance  - vaccines:  Has gotten 3 covid vaccines and seasonal flu vaccine  - Received Evusheld 11/04/2020    Due to this patient's diagnosis, she is at significant risk for subsequent morbidity and/or mortality.    These services include the following elements of medical decision making:  addressing a hematologic cancer that poses a threat to life and/or bone marrow function  interpretation of diagnostic test reports or ordering of diagnostic tests and independent interpretation of diagnostic tests    I personally spent 45 minutes face-to-face and non-face-to-face in the care of this patient, which includes all pre, intra, and post visit time on the date of service.    Bertram Millard, RN, NP Student participated in this visit    Langley Gauss, AGPCNP-BC  Nurse Practitioner  Hematology/Oncology  [facility]    Dr. Lonni Fix was available.    History of Present Illness:     Cristina Fields is a 70 y.o. female who is seen in consultation at the request of Lavera Guise Jack Quarto, MD for follow up of CLL/SLL.    Interim History  Regarding her CLL treatment, she reports she is continuing to tolerate the zanabrutinib well and denies changes from baseline related to her therapy. However, it is difficult to fully assess her functional status  because she is here today with progressively severe perirectal pain which has interfered with her ability to eat, sleep, sit, defecate or walk comfortably.   ??  The pain has been present for several months s/t anal condyloma, although the severity has increased markedly over the past 2 weeks, prompting her to present to [facility] emergency room on 4/4. She reports she was given IV Toradol and provided with topical lidocaine and cortisone creams which did not reduce inflammation or alleviate her pain. Her currently prescribed pain medications (oxycodone and tramadol) have been unhelpful; sitting for long periods during HD treatment and driving long distances to [facility] and [facility] have exacerbated the pain.  ??  Condyloma were previously treated by dermatology through the [facility]; 2 months ago the lesions were shaved by [facility] dermatologist. Initially, this partially alleviated pain but she states one lesion remained which continued to grow. She and her husband state she cannot be seen by [facility] dermatology until 02/03/2021.   ??  She has previously documented facial rash which she and husband report is much improved today. Previously documented nodules on bilateral legs self-resolved several weeks ago and have not returned per pt.     REVIEW OF SYSTEMS:   CONSTITUTIONAL: No fevers, chills, + weight loss,+ drenching night sweats.   HEENT: Eyes: No blurred vision. ENT: No earache, sore throat or runny nose.   CARDIOVASCULAR: No chest pain, palpitations.  RESPIRATORY: No cough, shortness of breath, PND or orthopnea.   GASTROINTESTINAL: No nausea, vomiting or diarrhea.   GENITOURINARY: No dysuria, frequency or urgency.   MUSCULOSKELETAL: No muscle pain or weakness.  SKIN: No rashes or other skin complaints.  NEUROLOGIC: No headaches, paresthesias, fasciculations, seizures.  PSYCHIATRIC: No disorder of thought or mood.   ENDOCRINE: No heat or cold intolerance, polyuria or polydipsia.   HEMATOLOGICAL: No easy bruising or bleeding.      Oncology History:    Oncology History Overview Note   Prior therapy:  FCR x6 at [facility] beginning in 06/2011 for rapid LDT, attained a CR     Relapse 05/2014     09/22/2015 bendamustine (90 mg/m2) began (no Ritux first cycle)  10/20/2015: BR, severe infusion reaction prompting MICU stay  11/17/15 BR, benda reduced to 70 mg/m2, ritux tolerated  01/2016 - 6th cycle BR completed   03/2016 PET consistent with response     Relapse in 2018     02/20/18 ibrutinib 140 mg daily started (dose rec by pharmacy due to CKD)     04/11/20 ibrutinib held when pt admitted for acute cerebellar infarct     04/26/20 admitted for ritux re-initiation with plan for 4 weeks in setting of rapid relapse (WBC up to 88K, PET not concerning for transformation)     05/10/20 chlorambucil started      07/12/20 cycle 3 ritux - cycle 4 declined     CLL (chronic lymphoid leukemia) in relapse (CMS-HCC)   2012 Initial Diagnosis    CLL (chronic lymphoid leukemia) in relapse (CMS-HCC)     08/04/2019 Biopsy    (Outside case: [id number], dated 08/04/2019)  Bone marrow, aspiration and biopsy  -  Normocellular bone marrow (30%) involved by chronic lymphocytic leukemia / small lymphocytic lymphoma, representing 40% of marrow cellularity by outside PAX5 immunohistochemistry       -  By outside report, cytogenetic studies were performed and interpreted as below:  Karyotype (per [facility] report): 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  -  By outside report, molecular studies were  performed and interpreted as below:  Molecular studies (per [facility] report): IgVH unmutated.     09/21/2020 Biopsy      (Outside case: [id number], dated 09/21/2020)  Lymph node, left cervical, image-guided needle core biopsy  -  Chronic lymphocytic leukemia/small lymphocytic lymphoma (see Comment)  -  Negative for large cell transformation in the sampled core.         11/24/2020 -  Chemotherapy    Zanubrutinib 160 mg ONCE daily         No past medical history on file.   No past surgical history on file.     No family history on file.    Social History     Tobacco Use   ??? Smoking status: Not on file   ??? Smokeless tobacco: Not on file   Substance Use Topics   ??? Alcohol use: Not on file   ??? Drug use: Not on file         Allergies   Allergen Reactions   ??? Lisinopril Cough and Other (See Comments)     Other reaction(s): Unknown         No current facility-administered medications for this visit.     No current outpatient medications on file.     Facility-Administered Medications Ordered in Other Visits   Medication Dose Route Frequency Provider Last Rate Last Admin   ??? acetaminophen (TYLENOL) tablet 1,000 mg  1,000 mg Oral Q8H Dorice Lamas, MD       ??? dextrose 50 % in water (D50W) 50 % solution 12.5 g  12.5 g Intravenous Q10 Min PRN Dorice Lamas, MD       ??? HYDROmorphone (DILAUDID) tablet 1 mg  1 mg Oral Q4H PRN Dorice Lamas, MD        Or   ??? HYDROmorphone (DILAUDID) tablet 2 mg  2 mg Oral Q4H PRN Dorice Lamas, MD       ??? insulin lispro (HumaLOG) injection 0-5 Units  0-5 Units Subcutaneous TID AC Dorice Lamas, MD   1 Units at 01/27/21 1228   ??? naloxone (NARCAN) injection 0.1 mg  0.1 mg Intravenous Q5 Min PRN Dorice Lamas, MD       ??? ondansetron (ZOFRAN) tablet 4 mg  4 mg Oral Q8H PRN Dorice Lamas, MD             Physical exam:  Vitals:    01/25/21 1534   BP: 133/85   Pulse: 85   Resp: 15   Temp: 36.9 ??C (98.5 ??F)   SpO2: 100%      General: Anxious, uncomfortable-appearing, arrived in WC, lying on her side; accompanied today by her husband  HEENT: Neck supple, membranes moist and pink; sclera anicteric  CARDIO: S1, S2, RRR, no clicks, gallops, rubs or murmurs  RESP: Non-labored, CTAB; no crackles, rhonchi or wheezes  GI: Abdomen soft, round, non-tender; bowel sounds normoactive x 4; no hepato/splenomegaly  GU: No CVA tenderness  MSK:  Full ROM in all major joints with no crepitus, swelling  EXT: Warm, well-perfused; no pitting edema   SKIN:  No visible rashes or lesions; pt defers rectal exam.  LYMPH: No palpable cervical, supraclavicular, axillary or inguinal nodes  Neuro: AOx4, gait steady, no focal deficits  Psych: Appropriate mood and affect      ECOG Performance Status: 2    Orders/Results:  Labs and pathology have been reviewed and pertinent results are as follows:    Lab on 01/25/2021  Component Date Value   ??? Sodium 01/25/2021 137    ??? Potassium 01/25/2021 3.6    ??? Chloride 01/25/2021 102    ??? Anion Gap 01/25/2021 9    ??? CO2 01/25/2021 26.0    ??? BUN 01/25/2021 34 (A)   ??? Creatinine 01/25/2021 4.91 (A)   ??? BUN/Creatinine Ratio 01/25/2021 7    ??? EGFR CKD-EPI Non-African* 01/25/2021 8 (A)   ??? EGFR CKD-EPI African Ame* 01/25/2021 10 (A)   ??? Glucose 01/25/2021 153    ??? Calcium 01/25/2021 8.1 (A)   ??? Albumin 01/25/2021 3.2 (A)   ??? Total Protein 01/25/2021 5.5 (A)   ??? Total Bilirubin 01/25/2021 0.3    ??? AST 01/25/2021 21    ??? ALT 01/25/2021 18    ??? Alkaline Phosphatase 01/25/2021 91    ??? Results Verified by Slid* 01/25/2021 Slide Reviewed    ??? WBC 01/25/2021 4.7    ??? RBC 01/25/2021 2.50 (A)   ??? HGB 01/25/2021 7.3 (A)   ??? HCT 01/25/2021 21.8 (A)   ??? MCV 01/25/2021 87.1    ??? MCH 01/25/2021 29.2    ??? MCHC 01/25/2021 33.5    ??? RDW 01/25/2021 15.4 (A)   ??? MPV 01/25/2021 10.6    ??? Platelet 01/25/2021 50 (A)   ??? Neutrophils % 01/25/2021 44.6    ??? Lymphocytes % 01/25/2021 45.1    ??? Monocytes % 01/25/2021 7.7    ??? Eosinophils % 01/25/2021 1.4    ??? Basophils % 01/25/2021 1.2    ??? Absolute Neutrophils 01/25/2021 2.1    ??? Absolute Lymphocytes 01/25/2021 2.1    ??? Absolute Monocytes 01/25/2021 0.4    ??? Absolute Eosinophils 01/25/2021 0.1    ??? Absolute Basophils 01/25/2021 0.1    ??? Smear Review Comments 01/25/2021 See Comment (A)   ??? Giant Platelets 01/25/2021 Present (A)   ??? Hypersegmented Neutrophi* 01/25/2021 Present (A)   ??? Toxic Granulation 01/25/2021 Present (A)       Diagnosis   Date Value Ref Range Status   10/05/2020   Final    (Outside case: [id number], dated 08/04/2019)  Bone marrow, aspiration and biopsy  -  Normocellular bone marrow (30%) involved by chronic lymphocytic leukemia / small lymphocytic lymphoma, representing 40% of marrow cellularity by outside PAX5 immunohistochemistry       -  By outside report, cytogenetic studies were performed and interpreted as below:  Karyotype (per [facility] report): Abnormal female karyotype with trisomy 26.  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    This electronic signature is attestation that the pathologist personally reviewed the submitted material(s) and the final diagnosis reflects that evaluation.         PET 10/04/20  FINDINGS:  HEAD, NECK and SUPRACLAVICULAR REGIONS: 1.1 cm left level 2 hypermetabolic lymph node (CT 86). No other suspicious hypermetabolic lesions or lymph nodes. Evaluation is partially limited by metallic streak artifact from dental amalgam.  ??  CHEST:  Thyroid: Grossly unremarkable.  Breasts: Grossly unremarkable.  Lungs and Pleura: No suspicious hypermetabolic nodules. No pleural effusion. 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  Mediastinum, Hila and Axillae: No suspicious hypermetabolic lymph nodes.  Cardiovascular: [Moderate coronary artery and aortic arch atherosclerotic calcifications. No pericardial effusion.  ??  ABDOMEN and PELVIS:  Liver: No suspicious hypermetabolic lesions.  Gallbladder: No discrete cholelithiasis. Trace echogenic calcified sludge near gallbladder neck.  Spleen: No suspicious hypermetabolic lesions or diffuse  uptake. Splenomegaly. Subtle focus of hypoattenuation measuring approximately 3.6 x 2.4 cm with decreased metabolic activity compared to the remainder of the spleen (CT 183).  Pancreas: No suspicious hypermetabolic lesions.  GI/Peritoneum/Mesentery: No suspicious hypermetabolic lesions or diffuse uptake. Scattered locules of intraperitoneal free air, likely related to indwelling peritoneal dialysis catheter with tip coiled in the pelvis. Trace pelvic ascites.  Adrenals: No suspicious hypermetabolic lesions.  Kidneys: Approximately 2.8 x 2.1 cm soft tissue lesion in the anterior left kidney (CT 198) without hypermetabolic activity. Scattered hypoattenuating renal cystic lesions, likely simple cysts. No hydronephrosis. No radiopaque renal or ureteral calculi.  GU: Grossly unremarkable.  Adenopathy: No suspicious hypermetabolic lymph nodes.  Vasculature: Moderate to severe calcified atherosclerotic disease throughout the visualized vasculature.   ??  MUSCULOSKELETAL: No suspicious osseous or soft tissue lesions. Approximately 6.5 x 5.2 cm nonhypermetabolic hypoattenuating fluid collection in the left gluteal musculature (CT 267). Mildly increased uptake overlying the bilateral greater trochanters.  ??  IMPRESSION:  -1.1 cm left level 2 mildly hypermetabolic lymph node, concerning for involvement by known CLL. No other suspicious hypermetabolic foci. Note that indolent lymphomas such as CLL may not be very avid.  ??  -Splenomegaly, likely related to known CLL. Nonhypermetabolic hypoattenuating focus measuring up to approximately 3.6 cm in the central spleen, indeterminate.  ??  -Nonhypermetabolic 2.8 cm soft tissue lesion in the anterior left kidney. Recommend correlation with prior imaging, if available. If not, consider contrast-enhanced cross-sectional imaging for further evaluation.  ??  -Nonhypermetabolic approximately 6.5 cm hypoattenuating fluid collection the left gluteal musculature, question old hematoma/seroma.  ??  -Increased radiotracer uptake overlying the greater trochanters, query trochanteric bursitis.  ??  -Additional chronic/incidental findings as above.

## 2021-01-28 NOTE — Unmapped (Signed)
HEMODIALYSIS NURSE PROCEDURE NOTE    Treatment Number:  1 Room/Station:  8 Procedure Date:  01/28/21   Total Treatment Time:  222 Min.    CONSENT:  Written consent was obtained prior to the procedure and is detailed in the medical record. Prior to the start of the procedure, a time out was taken and the identity of the patient was confirmed via name, medical record number and date of birth.     WEIGHTS:  Hemodialysis Pre-Treatment Weights     Date/Time Pre-Treatment Weight (kg) Estimated Dry Weight (kg) Patient Goal Weight (kg) Total Goal Weight (kg)    01/28/21 0822 50.7 kg (111 lb 12.4 oz) 50 kg (110 lb 3.7 oz) 0.7 kg (1 lb 8.7 oz) 1.25 kg (2 lb 12.1 oz)           Hemodialysis Post Treatment Weights     Date/Time Post-Treatment Weight (kg) Treatment Weight Change (kg)    01/28/21 1230 49.5 kg (109 lb 2 oz) -1.2 kg        Active Dialysis Orders (168h ago, onward)     Start     Ordered    01/28/21 0752  Hemodialysis inpatient  Every Tue,Thu,Sat      Question Answer Comment   K+ 2 meq/L    Ca++ 2.5 meq/L    Bicarb 35 meq/L    Na+ 137 meq/L    Na+ Modeling No    Dialyzer F180NR    Dialysate Temperature (C) 37    BFR-As tolerated to a maximum of: 400 mL/min    DFR 800 mL/min    Duration of treatment 3.5 Hr    Dry weight (kg) 50    Challenge dry weight (kg) No    Fluid removal (L) to EDW    Tubing Adult = 142 ml    Access Site AVF    Access Site Location Left        01/28/21 0751              ACCESS SITE:             Arteriovenous Fistula - Vein Graft  Access Arteriovenous vein graft Left;Upper Arm (Active)   Site Assessment Ecchymotic;Other (Comment) 01/28/21 1230   AV Fistula Thrill Present;Bruit Present 01/28/21 1230   Status Deaccessed 01/28/21 1230   Site Condition No complications 01/28/21 1230   Dressing Gauze 01/28/21 1230   Dressing To Be Removed (Date/Time) after 4-6 hours 01/28/21 1230     Catheter Fill Volumes:  Arterial:   mL Venous:   mL   Catheter filled with  post procedure.    Patient Lines/Drains/Airways Status     Active Peripheral & Central Intravenous Access     Name Placement date Placement time Site Days    Peripheral IV 01/26/21 Right Forearm 01/26/21  1320  Forearm  2              LAB RESULTS:  Lab Results   Component Value Date    NA 139 01/28/2021    K 4.9 (H) 01/28/2021    CL 103 01/28/2021    CO2 23.0 01/28/2021    BUN 73 (H) 01/28/2021    CREATININE 9.05 (H) 01/28/2021    GLU 96 01/28/2021    CALCIUM 8.0 (L) 01/28/2021    PHOS 7.1 (H) 01/28/2021    MG 1.7 01/28/2021    FERRITIN 1,072.7 (H) 01/28/2021     Lab Results   Component Value Date    WBC 5.1 01/28/2021  HGB 7.0 (L) 01/28/2021    HCT 21.5 (L) 01/28/2021    PLT 45 (L) 01/28/2021        VITAL SIGNS:    Hemodynamics     Date/Time Pulse BP MAP (mmHg) Patient Position    01/28/21 1230 85 176/67  notified nephrologist ??? Sitting    01/28/21 1145 71 169/70 ??? Lying    01/28/21 1115 71 181/70 ??? Lying    01/28/21 1045 79 172/64 ??? Sitting    01/28/21 1015 77 169/72 ??? Sitting    01/28/21 0945 78 175/65 ??? Sitting        Blood Volume Monitor     Date/Time Blood Volume Change (%) HCT HGB Critline O2 SAT %    01/28/21 1145 -10.1 % 24.6 8.4 95.3    01/28/21 1115 -11.1 % 24.9 8.5 96.2    01/28/21 1045 -10.3 % 24.7 8.4 94.5    01/28/21 1015 -9.5 % 24.4 8.3 93    01/28/21 0945 -8.5 % 24.2 8.2 92.9        Oxygen Therapy     Date/Time Resp SpO2 O2 Device FiO2 (%) O2 Flow Rate (L/min)    01/28/21 1230 16 ??? None (Room air) -- ???    01/28/21 1145 16 ??? None (Room air) -- ???    01/28/21 1115 16 ??? None (Room air) -- ???    01/28/21 1045 16 ??? None (Room air) -- ???    01/28/21 1015 16 ??? None (Room air) -- ???    01/28/21 0945 16 ??? None (Room air) -- ???        Oxygen Connected to Wall:  no    Pre-Hemodialysis Assessment     Date/Time Therapy Number Dialyzer All Machine Alarms Passed Air Detector Dialysis Flow (mL/min)    01/28/21 0822 1 F-180 (98 mLs) Yes Engaged 800 mL/min    Date/Time Verify Priming Solution Priming Volume Hemodialysis Independent pH Hemodialysis Machine Conductivity (mS/cm) Hemodialysis Independent Conductivity (mS/cm)    01/28/21 0822 0.9% NS 300 mL ???  n/a 13.8 mS/cm 13.8 mS/cm    Date/Time Bicarb Conductivity Residual Bleach Negative Free Chlorine Total Chlorine Chloramine    01/28/21 0822 -- Yes -- 0 --        Pre-Hemodialysis Treatment Comments     Date/Time Pre-Hemodialysis Comments    01/28/21 0822 alert ;in recliner        Hemodialysis Treatment     Date/Time Blood Flow Rate (mL/min) Arterial Pressure (mmHg) Venous Pressure (mmHg) Transmembrane Pressure (mmHg)    01/28/21 1230 ??? ??? ??? ???    01/28/21 1145 400 mL/min -98 mmHg 211 mmHg 52 mmHg    01/28/21 1115 400 mL/min -106 mmHg 195 mmHg 48 mmHg    01/28/21 1045 400 mL/min -98 mmHg 201 mmHg 28 mmHg    01/28/21 1015 400 mL/min -97 mmHg 205 mmHg 243 mmHg    01/28/21 0945 400 mL/min -102 mmHg 203 mmHg 44 mmHg    01/28/21 0915 400 mL/min -85 mmHg 208 mmHg 44 mmHg    01/28/21 0900 400 mL/min -89 mmHg 192 mmHg 52 mmHg    01/28/21 0848 400 mL/min -80 mmHg 190 mmHg 50 mmHg    Date/Time Ultrafiltration Rate (mL/hr) Ultrafiltrate Removed (mL) Dialysate Flow Rate (mL/min) KECN Linna Caprice)    01/28/21 1230 ??? 1250 mL ??? ???    01/28/21 1145 370 mL/hr 979 mL 800 ml/min ???    01/28/21 1115 370 mL/hr 798 mL 800 ml/min ???    01/28/21 1045 0 mL/hr  611 mL 800 ml/min ???    01/28/21 1015 0 mL/hr 458 mL 0 ml/min ???    01/28/21 0945 290 mL/hr 290 mL 800 ml/min ???    01/28/21 0915 290 mL/hr 143 mL 800 ml/min ???    01/28/21 0900 360 mL/hr 68 mL 800 ml/min ???    01/28/21 0848 360 mL/hr 0 mL 800 ml/min ???        Hemodialysis Treatment Comments     Date/Time Intra-Hemodialysis Comments    01/28/21 1230 Tx completed.rinseback    01/28/21 1145 stable.resting    01/28/21 1115 c/o pain.meds given    01/28/21 1045 pt stable    01/28/21 1015 pt stable    01/28/21 0945 pt stable    01/28/21 0915 pt. watching TV, resumed care    01/28/21 0900 Dr Margaretmary Bayley rounding    01/28/21 0848 HD started        Post Treatment     Date/Time Rinseback Volume (mL) On Line Clearance: spKt/V Total Liters Processed (L/min) Dialyzer Clearance    01/28/21 1230 300 mL 2.33 spKt/V 81.8 L/min Moderately streaked          Post Hemodialysis Treatment Comments     Date/Time Post-Hemodialysis Comments    01/28/21 1230 alert and stable        POST TREATMENT ASSESSMENT:  General appearance:  alert  Neurological:  Grossly normal  Lungs:  clear to auscultation bilaterally  Hearts:  regular rate and rhythm, S1, S2 normal, no murmur, click, rub or gallop  Abdomen:  soft, non-tender; bowel sounds normal; no masses,  no organomegaly  Pulses:    Skin:  Skin color, texture, turgor normal. No rashes or lesions    Hemodialysis I/O     Date/Time Total Hemodialysis Replacement Volume (mL) Total Ultrafiltrate Output (mL)    01/28/21 1230 ??? 700 mL        [id number] - Medicaitons Given During Treatment  (last 4 hrs)         Quavion Boule Warrick Parisian, RN       Medication Name Action Time Action Route Rate Dose User     HYDROmorphone (DILAUDID) tablet 1 mg (Or Linked Group #1) 01/28/21 1119 See Alternative Oral   Sharada Albornoz Warrick Parisian, RN     HYDROmorphone (DILAUDID) tablet 2 mg (Or Linked Group #1) 01/28/21 1119 Given Oral  2 mg Nial Hawe Warrick Parisian, RN                  Patient tolerated treatment in a  Dialysis Recliner.

## 2021-01-28 NOTE — Unmapped (Signed)
Internal Medicine (MEDL) Progress Note    I attest that I have reviewed the student note and that the components of the history of the present illness, the physical exam, and the assessment and plan documented were performed by me or were performed in my presence by the student where I verified the documentation and performed (or re-performed) the exam and medical decision making. Rickard Rhymes, MD    Assessment & Plan:   Cristina Fields is a 70 y.o. 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 *      Rectal pain - Exophytic anal lesion likely Condyloma - improving  Patient is followed by [facility] dermatology for lesion. Has been present for ~1 year (since before her CLL diagnosis). Last shaved by derm two months ago. Experienced acute worsening of pain over last 4 days. Appearance is consistent with condyloma which makes sense in setting of immunocompromise due to advanced CLL. Lesion has slight bleeding (she estimates 1Tbl) but no  - PO dilauded PRN with IV for breakthrough  - Follow-up colorectal surgery recommendations  - Follow-up with palliative care as outpatient  ??  Diarrhea and N/V 2/2 pain vs infectious diarrhea - improving  Improved today. No more diarrhea, nausea, or vomiting since admission. Given immunocompromise there is concern for infectious process but could also be sequelae of severe pain. Concern for obstruction is low as patient endorses no difficulty passing BM although mass is painful to pass stool through. Unclear if N/V is caused by pain or if pain is caused by diarrhea. No fevers/chills/leukocytosis and abdomen soft, given new diarrhea of unclear cause with N/V in this immunocompromised woman, we will evaluate for infectious causes of her diarrhea.   - GIPP/C Diff/CMV  - Ondansetron IV/PO PRN  ??  Anemia (BL 9?) - Thrombocytopenia (BL50s?) - CLL  In setting of advanced CLL, this could be due to any combination of a number of factors including bone marrow infiltration, autoimmune destruction, splenic sequestration, or drug toxicities. Her hemoglobin and platelets have been trending down since January 2021 but have been relatively stable for the last month. However, in setting of oozing exophytic anal mass there is concern for blood loss anemia as well.  - HOLD Zanubrutinib (Husband gave patient last dose 4/7 PM), per onc  - Transfuse below Hgb 7 or if symptomatic  - Daily CBC  - Follow-up reticulocyte count  - Follow-up immunoglobulin levels  ??  Petechial Rash on Face   See picture in chart. One day history. No similar rashes in the past. Possibly drug related vs trauma although reports no head trauma to our team. Suspect related to Zanubritinib as a frequent side effect. Onc's last note agrees.  ??  Type II diabetes  Not currently on home regimen. Glucose to 238 on admission.  - Sensitive sliding scale insulin given impaired kidney function  - Check A1c  ??  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 She notes that she missed her HD session today. Creatinine of 6.42 on admission. EDW 50. Usually TTHS(?)  - Dialysis today    Daily Checklist:  Diet: Regular Diet  DVT PPx: Contradindicated - Thrombocytopenic  Electrolytes: No Repletion Needed  Code Status: Full Code  Dispo: pending consultant evaluation of anal condyloma    Team Contact Information:   Primary Team: Internal Medicine (MEDL)  Primary Resident: Marrianne Mood MS4  Resident's Pager: [phone] (Gen MedL Intern - Cliffton Asters)    Interval History:   No acute events overnight. Tolerating PO well without any nausea or vomiting.  Denies BM in past 18 hours.  Pain is 4/10 and under much better control this morning.    ROS: Denies headache, chest pain, shortness of breath, abdominal pain, nausea, vomiting.    Objective:   Temp:  [36.8 ??C (98.2 ??F)-37 ??C (98.6 ??F)] 37 ??C (98.6 ??F)  Heart Rate:  [84-87] 84  Resp:  [18] 18  BP: (126-161)/(52-100) 161/52  SpO2:  [93 %-100 %] 93 %    Gen: WDWN female in NAD, answers questions appropriately, sitting up eating breakfast  Eyes: sclera anicteric, EOMI  HENT: atraumatic, MMM, OP w/o erythema or exudate   Heart: RRR, S1, S2, no M/R/G, no chest wall tenderness  Lungs: CTAB, no crackles or wheezes, no use of accessory muscles  Abdomen: Normoactive bowel sounds, soft, NTND, no rebound/guarding  Extremities: no clubbing, cyanosis, or edema in the BLEs  Skin: petechial/purpuric rash mostly around R eye, some in T zone; anal lesion as noted previously  Psych: Alert, oriented, appropriate mood and affect    Labs/Studies: Labs and Studies from the last 24hrs per EMR and Reviewed

## 2021-01-28 NOTE — Unmapped (Addendum)
Cristina Fields is a 70 y.o. 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     Rectal pain - Exophytic anal lesion likely Condyloma  Patient is followed by [facility] dermatology for lesion. Has been present for ~1 year (since before her CLL diagnosis). Last shaved by derm two months ago. Experienced acute worsening of pain over last 4 days. Appearance is consistent with condyloma which makes sense in setting of immunocompromise due to advanced CLL. Pain improved with resolution of diarrhea, however colorectal surgery was concerned for underlying cancer. They also noted that she is high risk for poor healing given her CLL and chemotherapy. Performed exam under anesthesia and biopsy for better characterization of lesion. Follow-up set with colorectal surgery. Obtain MRI of mass ASAP.     Diarrhea and N/V 2/2 pain vs infectious diarrhea - resolved  Patient initially presented with concerns of N/V/diarrhea, however quickly resolved during first day of hospital admission. Given immunocompromise there is concern for infectious process but could also be sequelae of severe pain. Concern for obstruction was low as patient endorses no difficulty passing BM although mass is painful to pass stool through. No fevers/chills/leukocytosis and abdomen soft throughout stay. While inpatient she had no further stools with supportive care and GIPP was never sent.     Anemia (BL 9?) - Thrombocytopenia (BL50s?) - CLL  On admission, anemia and thrombocytopenia was reportedly lower than baseline per oncology notes. In setting of advanced CLL, this could be due to any combination of a number of factors including bone marrow infiltration, autoimmune destruction, splenic sequestration, or drug toxicities. Her hemoglobin and platelets have been trending down since January 2021 but have been relatively stable for the last month. During admission, Zanubrutinib was held per onc and she did not require any transfusions.     Petechial Rash on Face: See picture in chart. Developed day prior to admission. No similar rashes in the past. Suspect related to Zanubritinib as a frequent side effect. Onc's last note agrees.     Type II diabetes: Not currently on home regimen. Glucose to 238 on admission. HgbA1c 5.8% in March 16109. Required insulin sporadically during admission.     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 Continued HD while inpatient.

## 2021-01-28 NOTE — Unmapped (Signed)
[facility] Nephrology Hemodialysis Procedure Note     01/28/2021    Cristina Fields was seen and examined on hemodialysis    CHIEF COMPLAINT: End Stage Renal Disease    INTERVAL HISTORY: 70 yo with ESRD, CLL admitted for rectal pain, exophytic anal lesion with bleeding, and diarrhea. Hemoglobin is 7.0    DIALYSIS TREATMENT DATA:  Estimated Dry Weight (kg): 50 kg (110 lb 3.7 oz)  Patient Goal Weight (kg): 0.7 kg (1 lb 8.7 oz)  Dialyzer: F-180 (98 mLs)  Dialysis Bath  Bath: 2 K+ / 2.5 Ca+  Dialysate Na (mEq/L): 137 mEq/L  Dialysate HCO3 (mEq/L): 35 mEq/L  Dialysate Total Buffer HCO3 (mEq/L):  (n/a)     Dialysis Flow (mL/min): 800 mL/min    PHYSICAL EXAM:  Vitals:  Temp:  [36.6 ??C (97.8 ??F)-37.4 ??C (99.3 ??F)] 36.6 ??C (97.8 ??F)  Heart Rate:  [76-89] 84  BP: (118-169)/(52-78) 169/73  MAP (mmHg):  [87] 87  Weights:  Pre-Treatment Weight (kg): 50.7 kg (111 lb 12.4 oz)    General: in no acute distress, currently dialyzing in a chair  Pulmonary: normal respiratory effort  Cardiovascular: regular rate and rhythm  Extremities: no significant  edema  Access: LUE AV graft    LAB DATA:  Lab Results   Component Value Date    NA 139 01/28/2021    K 4.9 (H) 01/28/2021    CL 103 01/28/2021    CO2 23.0 01/28/2021    BUN 73 (H) 01/28/2021    CREATININE 9.05 (H) 01/28/2021    CALCIUM 8.0 (L) 01/28/2021    MG 1.7 01/28/2021    PHOS 6.4 (H) 01/27/2021    ALBUMIN 3.1 (L) 01/26/2021      Lab Results   Component Value Date    HCT 21.5 (L) 01/28/2021    WBC 5.1 01/28/2021        ASSESSMENT/PLAN:  End Stage Renal Disease on Intermittent Hemodialysis:  UF goal: 0.7 L as tolerated  Adjust medications for a GFR <10  Avoid nephrotoxic agents     Bone Mineral Metabolism:  Lab Results   Component Value Date    CALCIUM 8.0 (L) 01/28/2021    CALCIUM 8.4 (L) 01/27/2021    Lab Results   Component Value Date    ALBUMIN 3.1 (L) 01/26/2021    ALBUMIN 3.2 (L) 01/25/2021      Lab Results   Component Value Date    PHOS 6.4 (H) 01/27/2021    No results found for: PTH   Labs appropriate, no changes.    Anemia:   Lab Results   Component Value Date    HGB 7.0 (L) 01/28/2021    HGB 7.2 (L) 01/27/2021    HGB 7.7 (L) 01/26/2021    No results found for: LABIRON   No results found for: FERRITIN    On mircera 60 mg q 2 weeks as outpatient. Will start Procrit Wednesday.    Kerrin Mo, MD  [place] Division of Nephrology & Hypertension

## 2021-01-28 NOTE — Unmapped (Incomplete)
Internal Medicine (MEDL) Progress Note    Assessment & Plan:   Cristina Fields is a 70 y.o. 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 *      Rectal pain - Exophytic anal lesion likely Condyloma - improving  Patient is followed by [facility] dermatology for lesion. Has been present for ~1 year (since before her CLL diagnosis). Last shaved by derm two months ago. Experienced acute worsening of pain over last 4 days. Appearance is consistent with condyloma which makes sense in setting of immunocompromise due to advanced CLL. Lesion has slight bleeding (she estimates 1Tbl) but no  - PO dilauded PRN with IV for breakthrough  - CRC plans biopsy given cf for anal cancer early this week  - Follow-up with palliative care as outpatient  ??  Anemia (BL 9?) - Thrombocytopenia (BL50s?) - CLL  In setting of advanced CLL, this could be due to any combination of a number of factors including bone marrow infiltration, autoimmune destruction, splenic sequestration, or drug toxicities. Her hemoglobin and platelets have been trending down since January 2021 but have been relatively stable for the last month. However, in setting of oozing exophytic anal mass there is concern for blood loss anemia as well.  - HOLD Zanubrutinib (Husband gave patient last dose 4/7 PM), per onc  - Transfuse below Hgb 7 or if symptomatic  - Daily CBC  - Touch base with   ??  Type II diabetes  Not currently on home regimen. Glucose to 238 on admission.  - Sensitive sliding scale insulin given impaired kidney function (not utilized during admission)  - Check A1c  ??  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 She notes that she missed her HD session today. Creatinine of 6.42 on admission. EDW 50. Usually TTHS(?)  - Dialysis today    Daily Checklist:  Diet: Regular Diet  DVT PPx: Contradindicated - Thrombocytopenic  Electrolytes: No Repletion Needed  Code Status: Full Code  Dispo: pending consultant evaluation of anal condyloma    Team Contact Information:   Primary Team: Internal Medicine (MEDL)  Primary Resident: Marrianne Mood MS4  Resident's Pager: [phone] (Gen MedL Intern - Cliffton Asters)    Interval History:   Patient comfortable this AM.***    ROS: Denies headache, chest pain, shortness of breath, abdominal pain, nausea, vomiting.    Objective:   Temp:  [36.6 ??C-37.4 ??C] 36.6 ??C  Heart Rate:  [76-89] 80  Resp:  [16-18] 16  BP: (118-169)/(52-79) 164/79  SpO2:  [92 %-96 %] 96 %    Gen: WDWN female in NAD, answers questions appropriately, sitting up eating breakfast  Eyes: sclera anicteric, EOMI  HENT: atraumatic, MMM, OP w/o erythema or exudate   Heart: RRR, S1, S2, no M/R/G, no chest wall tenderness  Lungs: CTAB, no crackles or wheezes, no use of accessory muscles  Abdomen: Normoactive bowel sounds, soft, NTND, no rebound/guarding  Extremities:  no clubbing, cyanosis, or edema in the BLEs  Skin: petechial/purpuric rash mostly around R eye, some in T zone; anal lesion as noted previously  Psych: Alert, oriented, appropriate mood and affect    Labs/Studies: Labs and Studies from the last 24hrs per EMR and Reviewed

## 2021-01-29 DIAGNOSIS — Z992 Dependence on renal dialysis: Secondary | ICD-10-CM | POA: Diagnosis not present

## 2021-01-29 DIAGNOSIS — D649 Anemia, unspecified: Secondary | ICD-10-CM | POA: Diagnosis not present

## 2021-01-29 DIAGNOSIS — N186 End stage renal disease: Secondary | ICD-10-CM | POA: Diagnosis not present

## 2021-01-29 DIAGNOSIS — D696 Thrombocytopenia, unspecified: Secondary | ICD-10-CM | POA: Diagnosis not present

## 2021-01-29 DIAGNOSIS — K629 Disease of anus and rectum, unspecified: Secondary | ICD-10-CM | POA: Diagnosis not present

## 2021-01-29 LAB — BASIC METABOLIC PANEL
ANION GAP: 10 mmol/L (ref 5–14)
BLOOD UREA NITROGEN: 35 mg/dL — ABNORMAL HIGH (ref 9–23)
BUN / CREAT RATIO: 7
CALCIUM: 7.9 mg/dL — ABNORMAL LOW (ref 8.7–10.4)
CHLORIDE: 100 mmol/L (ref 98–107)
CO2: 28 mmol/L (ref 20.0–31.0)
CREATININE: 5.09 mg/dL — ABNORMAL HIGH
EGFR CKD-EPI AA FEMALE: 9 mL/min/{1.73_m2} — ABNORMAL LOW (ref >=60)
EGFR CKD-EPI NON-AA FEMALE: 8 mL/min/{1.73_m2} — ABNORMAL LOW (ref >=60)
GLUCOSE RANDOM: 245 mg/dL — ABNORMAL HIGH (ref 70–179)
POTASSIUM: 3.2 mmol/L — ABNORMAL LOW (ref 3.4–4.8)
SODIUM: 138 mmol/L (ref 135–145)

## 2021-01-29 LAB — CBC
HEMATOCRIT: 21.7 % — ABNORMAL LOW (ref 34.0–44.0)
HEMOGLOBIN: 7 g/dL — ABNORMAL LOW (ref 11.3–14.9)
MEAN CORPUSCULAR HEMOGLOBIN CONC: 32.5 g/dL (ref 32.0–36.0)
MEAN CORPUSCULAR HEMOGLOBIN: 27.8 pg (ref 25.9–32.4)
MEAN CORPUSCULAR VOLUME: 85.7 fL (ref 77.6–95.7)
MEAN PLATELET VOLUME: 10.3 fL (ref 6.8–10.7)
PLATELET COUNT: 44 10*9/L — ABNORMAL LOW (ref 150–450)
RED BLOOD CELL COUNT: 2.53 10*12/L — ABNORMAL LOW (ref 3.95–5.13)
RED CELL DISTRIBUTION WIDTH: 15.5 % — ABNORMAL HIGH (ref 12.2–15.2)
WBC ADJUSTED: 4 10*9/L (ref 3.6–11.2)

## 2021-01-29 LAB — PHOSPHORUS: PHOSPHORUS: 5 mg/dL (ref 2.4–5.1)

## 2021-01-29 LAB — MAGNESIUM: MAGNESIUM: 1.6 mg/dL (ref 1.6–2.6)

## 2021-01-29 MED ADMIN — losartan (COZAAR) tablet 100 mg: 100 mg | ORAL | @ 12:00:00

## 2021-01-29 MED ADMIN — acetaminophen (TYLENOL) tablet 1,000 mg: 1000 mg | ORAL | @ 12:00:00

## 2021-01-29 MED ADMIN — magnesium oxide (MAG-OX) tablet 800 mg: 800 mg | ORAL | @ 20:00:00 | Stop: 2021-01-29

## 2021-01-29 MED ADMIN — HYDROmorphone (DILAUDID) tablet 2 mg: 2 mg | ORAL | @ 13:00:00 | Stop: 2021-02-10

## 2021-01-29 MED ADMIN — insulin lispro (HumaLOG) injection 0-12 Units: 0-12 [IU] | SUBCUTANEOUS | @ 22:00:00

## 2021-01-29 MED ADMIN — HYDROmorphone (DILAUDID) tablet 2 mg: 2 mg | ORAL | Stop: 2021-02-10

## 2021-01-29 MED ADMIN — acetaminophen (TYLENOL) tablet 1,000 mg: 1000 mg | ORAL | @ 04:00:00

## 2021-01-29 MED ADMIN — acetaminophen (TYLENOL) tablet 1,000 mg: 1000 mg | ORAL | @ 20:00:00

## 2021-01-29 MED ADMIN — HYDROmorphone (DILAUDID) tablet 2 mg: 2 mg | ORAL | @ 21:00:00 | Stop: 2021-02-10

## 2021-01-29 MED ADMIN — insulin lispro (HumaLOG) injection 0-5 Units: 0-5 [IU] | SUBCUTANEOUS | @ 16:00:00 | Stop: 2021-01-29

## 2021-01-29 NOTE — Unmapped (Signed)
Internal Medicine (MEDL) Progress Note    I attest that I have reviewed the student note and that the components of the history of the present illness, the physical exam, and the assessment and plan documented were performed by me or were performed in my presence by the student where I verified the documentation and performed (or re-performed) the exam and medical decision making.    Assessment & Plan:   Cristina Fields is a 70 y.o. female with a PMHx of ESRD on dialysis, type 2 diabetes, CLL on zanubrutinib, and anal condyloma that presented to [facility] with severe anal pain, anemia, diarrhea, and vomiting.    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 *      Rectal pain - Exophytic anal lesion likely Condyloma - improving  Patient is followed by [facility] dermatology for lesion. Has been present for ~1 year (since before her CLL diagnosis). Last shaved by derm two months ago. Experienced acute worsening of pain over last 4 days. Appearance is consistent with condyloma which makes sense in setting of immunocompromise due to advanced CLL. Lesion has slight bleeding (she estimates 1Tbl)  - PO dilauded PRN with IV for breakthrough  - colorectal surgery c/s, planning fof anorectal EUA and biopsy 4/11 (NPOpMN)  - 1u platelets and pRBCs ordered to be prepared for OR  - f/u MRI pelvis  - Follow-up with palliative care as outpatient     Anemia (BL 9?) - Thrombocytopenia (BL50s?) - CLL  In setting of advanced CLL, this could be due to any combination of a number of factors including bone marrow infiltration, autoimmune destruction, splenic sequestration, or drug toxicities. Her hemoglobin and platelets have been trending down since January 2021 but have been relatively stable for the last month. However, in setting of oozing exophytic anal mass there is concern for blood loss anemia as well.  - HOLD Zanubrutinib (Husband gave patient last dose 4/7 PM), per onc  - Transfuse below Hgb 7 or if symptomatic  - Daily CBC    Cognitive Impairment: SLUMS performed with score of 15/30, consistent with underlying diagnosis of dementia. Not previously diagnosed or mentioned.   - Consider referral to geri clinic to establish on discharge  - encourage speaking with husband, [name], for any major decisions      Petechial Rash on Face (stable): See picture in chart. One day history. No similar rashes in the past. Possibly drug related vs trauma although reports no head trauma to our team. Suspect related to Zanubritinib as a frequent side effect. Onc's last note agrees. CTM     Type II diabetes Not currently on home regimen.   - Sensitive->regular sliding scale insulin given high most of day yesterday when NPO  - f/u A1c     CKD on HD: ESRD due to autosomal dominant polycystic kidney disease. She notes that she missed her HD session today. Creatinine of 6.42 on admission. EDW 50. Usually TTHS(?)  - nephro following   - due for next mircera on 4/12 if still in hospital (will plan to give comparable dose of epogen)    Concern for  cognitive impairment:  - SLUMS screening     Daily Checklist:  Diet: Regular Diet  DVT PPx: Contradindicated - Thrombocytopenic  Electrolytes: No Repletion Needed  Code Status: Full Code  Dispo: pending consultant evaluation of anal condyloma    Team Contact Information:   Primary Team: Internal Medicine (MEDL)  Primary Resident: Marrianne Mood, MS4  Resident's Pager: [phone] (Gen MedL Intern - Cliffton Asters)    Interval History:   NAEO. Endorses pain from rectum. Wishes for team to follow up with husband     ROS: Denies headache, chest pain, shortness of breath, abdominal pain, nausea, vomiting.    Objective:   Temp:  [36.6 ??C (97.8 ??F)-37.1 ??C (98.8 ??F)] 36.8 ??C (98.2 ??F)  Heart Rate:  [71-90] 84  Resp:  [16-20] 20  BP: (150-181)/(52-79) 150/52  SpO2:  [95 %-100 %] 95 %    Gen: WDWN female in NAD, answers questions appropriately, lying in bed  Eyes: sclera anicteric  HENT: atraumatic, MMM, OP w/o erythema or exudate   Lungs: normal work of breathing on room air   Abdomen: Normoactive bowel sounds, soft, NTND, no rebound/guarding  Extremities: no clubbing, cyanosis, or edema in the BLEs  Skin: petechial/purpuric rash mostly around R eye, some in T zone  Psych: Alert, oriented, appropriate mood and affect, sometimes repeats words or phrases    Labs/Studies: Labs and Studies from the last 24hrs per EMR and Reviewed

## 2021-01-29 NOTE — Unmapped (Signed)
Care Management  Initial Transition Planning Assessment              General  Care Manager assessed the patient by : In person interview with patient, Medical record review, Discussion with Clinical Care team  Orientation Level: Oriented X4  Functional level prior to admission: Independent  Reason for referral: Discharge Planning    Contact/Decision Maker  Extended Emergency Contact Information  Primary Emergency Contact: Alois Cliche [place] [employer] Phone: [phone]  Relation: Spouse  Secondary Emergency Contact: Hanley Hays  Mobile Phone: [phone]  Relation: Son    Legal Next of Kin / Guardian / POA / Advance Directives       Advance Directive (Medical Treatment)  Does patient have an advance directive covering medical treatment?: Patient does not have advance directive covering medical treatment. (Patient's HCDM is her husband, Pearley Millington.)  Reason patient does not have an advance directive covering medical treatment:: Patient does not wish to complete one at this time.    Health Care Decision Maker [HCDM] (Medical & Mental Health Treatment)  Healthcare Decision Maker: Patient needs follow-up to appoint a Health Care Decision Maker.  Information offered on HCDM, Medical & Mental Health advance directives:: Patient declined information.    Advance Directive (Mental Health Treatment)  Does patient have an advance directive covering mental health treatment?: Patient does not have advance directive covering mental health treatment.    Patient Information  Lives with: Spouse/significant other    Type of Residence: Private residence        Location/Detail: There are no steps to enter the front of this patient's residence   Type of Residence: Mailing Address:  [address]  [place] [address]  Contacts: Accompanied by: Alone  Patient Phone Number:   Telephone Information:   Mobile [phone]           Medical Provider(s): Irma Newness, MD  Reason for Admission: Admitting Diagnosis:  Rectal pain [K62.89]  CLL (chronic lymphocytic leukemia) (CMS-HCC) [C91.10]  Anal condyloma [A63.0]  Nausea vomiting and diarrhea [R11.2, R19.7]  Past Medical History:   has no past medical history on file.  Past Surgical History:   has no past surgical history on file.   Previous admit date: N/A    Primary Insurance- Payor: HUMANA MEDICARE ADV / Plan: HUMANA GOLD PLUS HMO / Product Type: *No Product type* /   Secondary Insurance ??? None  Prescription Coverage ??? [employer]  Preferred Pharmacy - [facility]  [phone] - [place], [place] - [address]    Transportation home: Private vehicle (Husband will drive her home)        Support Systems/Concerns: Spouse    Responsibilities/Dependents at home?: No    Home Care services in place prior to admission?: No                  Equipment Currently Used at Home: walker, rolling, shower chair, commode chair       Currently receiving outpatient dialysis?: Yes  Facility providing dialysis (Name/Contact Info): [facility]       Need for financial assistance?: No       Social Determinants of Health  Social Determinants of Health were addressed in provider documentation.  Please refer to patient history.    Discharge Needs Assessment  Concerns to be Addressed: discharge planning    Clinical Risk Factors: > 65, Multiple Diagnoses (Chronic)  Barriers to taking medications: No    Prior overnight hospital stay or ED visit in last 90 days: No    Readmission Within the Last 30 Days: no previous admission in last 30 days         Anticipated Changes Related to Illness: none    Equipment Needed After Discharge: none    Discharge Facility/Level of Care Needs: other (see comments) (Home)    Readmission  Risk of Unplanned Readmission Score: UNPLANNED READMISSION SCORE: 20%  Predictive Model Details          23% (High)  Factor Value    Calculated 01/29/2021 14:17 18% Number of active Rx orders 29    [place] Risk of Unplanned Readmission Model 10% Diagnosis of cancer present     9% ECG/EKG order present in last 6 months     8% Latest calcium low (7.9 mg/dL)     7% Latest BUN high (35 mg/dL)     6% Imaging order present in last 6 months     5% Latest hemoglobin low (7.0 g/dL)     5% Phosphorous result present     5% Age 70     5% Number of ED visits in last six months 1     4% Active anticoagulant Rx order present     4% Active corticosteroid Rx order present     4% Latest creatinine high (5.09 mg/dL)     4% Diagnosis of renal failure present     3% Charlson Comorbidity Index 3     3% Current length of stay 2.913 days     1% Active ulcer medication Rx order present      Readmitted Within the Last 30 Days? (No if blank)   Patient at risk for readmission?: Yes    Discharge Plan  Screen findings are: Care Manager reviewed the plan of the patient's care with the Multidisciplinary Team. No discharge planning needs identified at this time. Care Manager will continue to manage plan and monitor patient's progress with the team.    Expected Discharge Date: 01/31/2021    Expected Transfer from Critical Care:      Quality data for continuing care services shared with patient and/or representative?: N/A  Patient and/or family were provided with choice of facilities / services that are available and appropriate to meet post hospital care needs?: N/A       Initial Assessment complete?: Yes

## 2021-01-29 NOTE — Unmapped (Signed)
OCCUPATIONAL THERAPY  Evaluation (01/29/21 0925)    Patient Name:  Cristina Fields       Medical Record Number: [id number]   Date of Birth: [birth year]  Sex: Female          OT Treatment Diagnosis:  Pt presents with decreased strength, balance and activity tolerance resulting with increased assistance needed to complete functional tasks.    Assessment  Problem List: Decreased strength, Decreased endurance, Impaired balance, Decreased mobility, Impaired hearing, Decreased safety awareness, Impaired judgement, Decreased cognition, Decreased coordination, Impaired ADLs, Fall Risk, Pain  Clinical Decision Making: Moderate  Assessment: Cristina Fields is a 70 y.o. 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 Pt presents with decreased strength, balance,and  activity tolerance, resulting with increased need for assistance with ADL, transfers, and functional mobility.  OT recommends pt continues skilled acute care OT in order to increase pt's safety and independence. After review of contributing co-morbidities and personal factors, clinical presentation and exam findings, patient demonstrates moderate  complexity for evaluation and development of POC.    Today's Interventions: Pt provided skilled education on the role  of OT and OT related goals, ADL training, transfer training, home safety, fall prevention, and POC. pt completed supine <> sit,  EOB LBD, sit to stand, 2 sets of  room level functional mobility with CGA and No AD and with SBA And RW,  simulated toileting and toielt transfer.    Activity Tolerance During Today's Session  Tolerated treatment well, Limited by cognition (Very tangential throughout session)    Plan  Planned Frequency of Treatment:  1-2x per day for: 3-4x week       Planned Interventions:  Adaptive equipment, ADL retraining, Balance activities, Bed mobility, Compensatory tech. training, Conservation, Education - Patient, Home exercise program, Functional mobility, Functional cognition, Environmental support, Endurance activities, Education - Family / caregiver, Range of motion, Safety education, Teacher, early years/pre, UE Strength / coordination exercise, Therapeutic exercise    Post-Discharge Occupational Therapy Recommendations:   3x weekly   OT DME Recommendations: Three in one commode -        GOALS:   Patient and Family Goals: Pt expressed desire  go home and be able to do enough for herself to not burden her husband.    Long Term Goal #1: Pt will score 24/24 on AMPAC       Short Term:  pt will complete toileting and toilet transfer with mod (I)   Time Frame : 2 weeks  Pt will  be able to tolerate > 10 minutes of standing ADL Tasks with mod (I)   Time Frame : 2 weeks  pt will complete full body dressing and item retrieval with mod (I)   Time Frame : 2 weeks  Pt will complete additional cognitive assessment   Time Frame : 2 weeks           Prognosis:  Good  Positive Indicators:  Higher PLOF  Barriers to Discharge: Endurance deficits, Decreased caregiver support, Impaired Balance, Pain, Gait instability, Functional strength deficits, Inability to safely perform ADLS    Subjective  Current Status Pt let seated EOB with needs and call bell within reach, with RN notified  Prior Functional Status pt is a retired Administrator, [employer] who worked as an Charity fundraiser. Pt is a questionable historian but reports she was IND with all ALD's/IALD's  including cooking and medication  management, spouse assist with transportation. Pt does not use any AD for functional mobility. Pt denies any falls and states she enjoys walking.    Medical Tests / Procedures: Reviewed in EPIC       Patient / Caregiver reports:  I 've been trying really hard to help him [her spouse] help me    No past medical history on file. Social History     Tobacco Use   ??? Smoking status: Not on file   ??? Smokeless tobacco: Not on file Substance Use Topics   ??? Alcohol use: Not on file      No past surgical history on file. History reviewed. No pertinent family history.     Lisinopril     Objective Findings  Precautions / Restrictions  Falls precautions    Weight Bearing  Non-applicable    Required Braces or Orthoses  Non-applicable    Communication Preference  Verbal    Pain  Pt reported 6-7/10 pain in backside, RN notified about request for pain meds and activities adjusted to best meet pt's pain tolerance    Equipment / Environment  Vascular access (PIV, TLC, Port-a-cath, PICC)    Living Situation  Living Environment: House  Lives With: Spouse (Severe chronic back pain)  Home Living: One level home, Level entry, Walk-in shower, Built-in shower seat, Grab bars in shower, Standard height toilet  Equipment available at home: Standard Walker     Cognition   Orientation Level:  Oriented x 4   Arousal/Alertness:  Generalized responses   Attention Span:  Attends with cues to redirect, Difficulty attending to directions   Memory:  Decreased short term memory   Following Commands:  Follows one step commands with increased time, Follows one step commands with repetition   Safety Judgment:  Decreased awareness of need for assistance, Decreased awareness of need for safety   Awareness of Errors:  Decreased awareness of need for safety, Decreased awareness of need for assistance   Problem Solving:  Assistance required to implement solutions, Assistance required to identify errors made, Assistance required to generate solutions   Comments: Pt very tangential throughout session, often answering questions inappropriately , needed questions repeated/ rephrased  to get appropriate answers, may be possibly from prior CVA.    Vision / Hearing   Vision: Glasses not present, Wears glasses all the time     Hearing: No deficit identified         Hand Function:  Right Hand Function: Right hand grip strength, ROM and coordination WNL  Left Hand Function: Left hand grip strength, ROM and coordination WNL  Hand Dominance: Right    Skin Inspection:  Skin Inspection: Intact where visualized    ROM / Strength:  UE ROM/Strength: Left WFL, Right WFL  LE ROM/Strength: Left WFL, Right WFL    Coordination:  Coordination: WFL    Sensation:  RUE Sensation: RUE intact  LUE Sensation: LUE intact  Sensory/ Proprioception/ Stereognosis comments: no report of numbness or tingling    Balance:  SBA for static/dynamic sitting balance, SBA for static standing CGA for dynamic standing    Functional Mobility  Transfer Assistance Needed: Yes  Transfers - Needs Assistance: Contact Guard assist  Bed Mobility Assistance Needed: No  Ambulation: Pt completed room level functional mobility with CGA and occasionally used furniture  for UE support, pt completed room level functional mobility with RW and SBA.      ADLs  ADLs: Needs assistance with ADLs, Supervision  ADLs - Needs Assistance:  Feeding, Grooming, Bathing, Toileting, UB dressing, LB dressing  Feeding - Needs Assistance: Set Up Assist  Grooming - Needs Assistance: Min assist, Performed standing  Bathing - Needs Assistance: Performed seated, Min assist  Toileting - Needs Assistance: Min assist  UB Dressing - Needs Assistance: Set Up Assist  LB Dressing - Needs Assistance: Min assist      Vitals / Orthostatics  At Rest: NAD  With Activity: NAD  Orthostatics: Asymptomatic      Medical Staff Made Aware: RN Alcario Drought Notified      Occupational Therapy Session Duration  OT Individual [mins]: 25       AM-PAC-Daily Activity  Lower Body Dressing assistance needs: A Little - Minimal/Contact Guard Assist/Supervision  Bathing assistance needs: A Little - Minimal/Contact Guard Assist/Supervision  Toileting assistance needs: A Little - Minimal/Contact Guard Assist/Supervision  Upper Body Dressing assistance needs: None - Modified Independent/Independent  Personal Grooming assistance needs: A Little - Minimal/Contact Guard Assist/Supervision  Eating Meals assistance needs: None - Modified Independent/Independent    Daily Activity Score:  Daily Activity Score: 20    Score (in points): % of Functional Impairment, Limitation, Restriction  6: 100% impaired, limited, restricted  7-8: At least 80%, but less than 100% impaired, limited restricted  9-13: At least 60%, but less than 80% impaired, limited restricted  14-19: At least 40%, but less than 60% impaired, limited restricted  20-22: At least 20%, but less than 40% impaired, limited restricted  23: At least 1%, but less than 20% impaired, limited restricted  24: 0% impaired, limited restricted      I attest that I have reviewed the above information.  Signed: Rhona Leavens, OT  Filed 01/29/2021

## 2021-01-29 NOTE — Unmapped (Addendum)
PHYSICAL THERAPY  Evaluation (01/29/21 1216)      Patient Name:?? Cristina Fields????????   Medical Record Number: [id number]   Date of Birth: 1950/11/01  Sex: Female??????????????      Treatment Diagnosis: impairments of standing balance and gait, pain     Activity Tolerance: Tolerated treatment well     ASSESSMENT  Problem List: Decreased endurance, Impaired balance, Fall Risk, Gait deviation, Pain     Per medical chart: Cristina Fields is a 70 y.o. 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     Assessment : Patient presents to initial PT evaluation with impairments listed above contributing deficits in functional mobility. Prior to admission, patient reported she was ambulating independently without an assistive device. At this time, patient c/o pain and presented with minor impairments of balance and gait, contributing to patient needing stand-by assist and benefiting from use of RW for safe OOB mobilization. Mobility appeared primarily limited by pain this session. Patient will benefit from skilled PT services to address mobility deficits, optimize functional mobility, and provide ongoing education.  Considering CLOF and intra-session improvement with use of RW, current recommendation is for post-acute PT at a frequency of 3x/week. After a review of the personal factors, co-morbidities, clinical presentation, and examination of the number of affected body systems, the patient presents as a low complexity case.     Today's Interventions: Initial PT evaluation, mobility assessment, and discharge planning. Bed mobility, transfers, functional standing balance with UE support needed, and gait without and with RW. Education provided regarding acute PT role and plan of care, importance/benefits of functional mobility, fall/safety precautions, RW use, upright positioning to tolerance, and activity pacing.        PLAN  Planned Frequency of Treatment:?? 1-2x per day for: 2-3x week       Planned Interventions: Balance activities, Education - Patient, Endurance activities, Education - Family / caregiver, Investment banker, operational, Functional mobility, Postural re-education, Therapeutic activity, Therapeutic exercise, Self-care / Home training, Transfer training, Home exercise program     Post-Discharge Physical Therapy Recommendations:?? 3x weekly     PT DME Recommendations: None (Pt owns a RW; defer to OT for ADL DME rec)??????????       Goals:   Patient and Family Goals: none contributed     Long Term Goal #1: In 6 weeks, pt will ambulate 60 ft indep without an assistive device.        SHORT GOAL #1: Pt will demo bed mobility indep.  ?????????????????????? Time Frame : 2 weeks  SHORT GOAL #2: Pt will demo functional transfers modified indep with LRAD.  ?????????????????????? Time Frame : 2 weeks  SHORT GOAL #3: Pt will ambulate 60 ft modified indep with LRAD.  ?????????????????????? Time Frame : 2 weeks       Prognosis:?? Good  Positive Indicators: PLOF, CLOF, good participation  Barriers to Discharge: Decreased caregiver support, Gait instability, Impaired Balance, Inability to safely perform ADLS, Pain     SUBJECTIVE  Equipment / Environment: Vascular access (PIV, TLC, Port-a-cath, PICC)  Patient reports: Pt agreeable to PT. Pt states I can walk.  Current Functional Status: Pt presents/left in side-lying position in bed with call bell and tray table within reach, all immediate needs met     Prior Functional Status: Pt reports she was ambulating indep without an assistive device and indep with bADLs. Pt denies any  falls in the past 6 months.  Equipment available at home: [employer], Bedside commode, Shower chair with back      No past medical history on file.      @  No past surgical history on file.       @     Allergies: Lisinopril         Objective Findings  Precautions / Restrictions  Precautions: Falls precautions  Weight Bearing Status: Non-applicable  Required Braces or Orthoses: Non-applicable                Pain Comments: 4/10 pain in region of rectum, activity adjusted per tolerance and positioned for comfort at bed of session, RN informed  Medical Tests / Procedures: reviewed in Epic  Equipment / Environment: Patient not wearing mask for full session, Vascular access (PIV, TLC, Port-a-cath, PICC)     At Rest: NAD  With Activity: NAD  Orthostatics: denies LH/dizziness with position changes        Living Situation  Living Environment: House  Lives With: Spouse (reports husband is unable to provide physical assist)  Home Living: One level home, Level entry, Walk-in shower, Standard height toilet      Cognition  Cognition comment: alert, able to follow motor commands appropriately        UE ROM / Strength  UE comment: moves BUEs voluntarily and anti-gravity  LE ROM / Strength  LE comment: moves BLEs voluntarily and anti-gravity     Motor/ Sensory/ Neuro  Balance comment: sitting EOB: limited assessment 2/2 increased pain. standing: SBA, needs UE support      Bed Mobility: supine <> sit EOB with supervision and HOB nearly flat     Transfers  Transfers: Sit to Stand  Sit to Stand comments: sit EOB <> stand with SBA and RW, education for hand placement      Gait  Level of Assistance: Standby assist, set-up cues, supervision of patient - no hands on  Assistive Device: Front wheel walker  Gait: amb ~20 ft with RW and SBA to supervision, 10 ft with SBA and no assistive device but seeking UE support on bed rail, returned to RW use with SBA to supervision for additional 10 ft, advised use of RW; demos slow cadence and improved stability with use of RW     Stairs: N/A, level entry to home      Endurance: fair     Physical Therapy Session Duration  PT Individual [mins]: 10     Medical Staff Made Aware: RN Alcario Drought cleared pt for PT and updated following     I attest that I have reviewed the above information.  Signed: Gustavus Bryant, PT  Filed 01/29/2021

## 2021-01-29 NOTE — Unmapped (Signed)
Patient's VSS, remained afebrile. Received Dilaudid PRN once before bed. Slept well through night. WCTM.    Problem: Adult Inpatient Plan of Care  Goal: Plan of Care Review  Outcome: Progressing  Goal: Patient-Specific Goal (Individualized)  Outcome: Progressing  Goal: Absence of Hospital-Acquired Illness or Injury  Outcome: Progressing  Intervention: Identify and Manage Fall Risk  Recent Flowsheet Documentation  Taken 01/29/2021 0405 by Adrienne Mocha, RN  Safety Interventions:   low bed   lighting adjusted for tasks/safety  Taken 01/29/2021 0200 by Adrienne Mocha, RN  Safety Interventions:   low bed   lighting adjusted for tasks/safety  Taken 01/29/2021 0000 by Adrienne Mocha, RN  Safety Interventions:   low bed   lighting adjusted for tasks/safety  Taken 01/28/2021 2200 by Adrienne Mocha, RN  Safety Interventions:   fall reduction program maintained   lighting adjusted for tasks/safety   low bed  Taken 01/28/2021 2000 by Adrienne Mocha, RN  Safety Interventions:   fall reduction program maintained   low bed   lighting adjusted for tasks/safety  Intervention: Prevent Skin Injury  Recent Flowsheet Documentation  Taken 01/29/2021 0200 by Adrienne Mocha, RN  Skin Protection: adhesive use limited  Taken 01/29/2021 0000 by Adrienne Mocha, RN  Skin Protection: adhesive use limited  Taken 01/28/2021 2200 by Adrienne Mocha, RN  Skin Protection: adhesive use limited  Taken 01/28/2021 2020 by Adrienne Mocha, RN  Skin Protection: adhesive use limited  Taken 01/28/2021 2000 by Adrienne Mocha, RN  Skin Protection: adhesive use limited  Intervention: Prevent and Manage VTE (Venous Thromboembolism) Risk  Recent Flowsheet Documentation  Taken 01/28/2021 2200 by Adrienne Mocha, RN  Activity Management: back to bed  Taken 01/28/2021 2000 by Adrienne Mocha, RN  Activity Management: activity adjusted per tolerance  Goal: Optimal Comfort and Wellbeing  Outcome: Progressing  Goal: Readiness for Transition of Care  Outcome: Progressing  Goal: Rounds/Family Conference  Outcome: Progressing     Problem: Self-Care Deficit  Goal: Improved Ability to Complete Activities of Daily Living  Outcome: Progressing     Problem: Infection  Goal: Absence of Infection Signs and Symptoms  Outcome: Progressing

## 2021-01-29 NOTE — Unmapped (Signed)
Consult appreciated, patient is actively being followed by nutrition services. See previous notes for details regarding nutrition history, assessment, and progress thus far.    Will continue to follow and be available as needed.     Lavella Lemons, MS, RD, LDN, CNSC  Pager: [phone]

## 2021-01-30 DIAGNOSIS — D013 Carcinoma in situ of anus and anal canal: Secondary | ICD-10-CM | POA: Diagnosis not present

## 2021-01-30 DIAGNOSIS — A630 Anogenital (venereal) warts: Secondary | ICD-10-CM | POA: Diagnosis not present

## 2021-01-30 LAB — PHOSPHORUS: PHOSPHORUS: 5.5 mg/dL — ABNORMAL HIGH (ref 2.4–5.1)

## 2021-01-30 MED ORDER — POLYETHYLENE GLYCOL 3350 17 GRAM/DOSE ORAL POWDER
Freq: Two times a day (BID) | ORAL | 0 refills | 7 days | Status: CP | PRN
Start: 2021-01-30 — End: 2021-03-01

## 2021-01-30 MED ORDER — POLYETHYLENE GLYCOL 3350 17 GRAM ORAL POWDER PACKET
PACK | Freq: Two times a day (BID) | ORAL | 0 refills | 50 days | Status: CN | PRN
Start: 2021-01-30 — End: ?

## 2021-01-30 MED ORDER — HYDROMORPHONE 2 MG TABLET
ORAL_TABLET | ORAL | 0 refills | 4 days | Status: CP | PRN
Start: 2021-01-30 — End: 2021-02-04

## 2021-01-30 MED ORDER — ACETAMINOPHEN 500 MG TABLET
ORAL_TABLET | Freq: Three times a day (TID) | ORAL | 0 refills | 5 days
Start: 2021-01-30 — End: ?

## 2021-01-30 MED ADMIN — propofoL (DIPRIVAN) injection: INTRAVENOUS | @ 12:00:00 | Stop: 2021-01-30

## 2021-01-30 MED ADMIN — acetaminophen (TYLENOL) tablet 1,000 mg: 1000 mg | ORAL | @ 20:00:00 | Stop: 2021-01-30

## 2021-01-30 MED ADMIN — bupivacaine-epinephrine (PF) (MARCAINE-PF w/EPI) 0.25 %-1:200,000 30 mL, lidocaine (XYLOCAINE) 10 mg/mL (1 %) 30 mL: @ 13:00:00 | Stop: 2021-01-30

## 2021-01-30 MED ADMIN — labetaloL (NORMODYNE,TRANDATE) injection: INTRAVENOUS | @ 13:00:00 | Stop: 2021-01-30

## 2021-01-30 MED ADMIN — sodium chloride (NS) 0.9 % infusion: INTRAVENOUS | @ 12:00:00 | Stop: 2021-01-30

## 2021-01-30 MED ADMIN — losartan (COZAAR) tablet 100 mg: 100 mg | ORAL | @ 15:00:00 | Stop: 2021-01-30

## 2021-01-30 MED ADMIN — fentaNYL (PF) (SUBLIMAZE) injection: INTRAVENOUS | @ 12:00:00 | Stop: 2021-01-30

## 2021-01-30 MED ADMIN — HYDROmorphone (DILAUDID) tablet 1 mg: 1 mg | ORAL | @ 20:00:00 | Stop: 2021-01-30

## 2021-01-30 MED ADMIN — lidocaine (XYLOCAINE) 20 mg/mL (2 %) injection: INTRAVENOUS | @ 12:00:00 | Stop: 2021-01-30

## 2021-01-30 MED ADMIN — polyethylene glycol (MIRALAX) packet 17 g: 17 g | ORAL | @ 03:00:00

## 2021-01-30 MED ADMIN — acetaminophen (TYLENOL) tablet 1,000 mg: 1000 mg | ORAL | @ 11:00:00 | Stop: 2021-01-30

## 2021-01-30 MED ADMIN — ondansetron (ZOFRAN) tablet 4 mg: 4 mg | ORAL | @ 03:00:00

## 2021-01-30 MED ADMIN — HYDROmorphone (DILAUDID) tablet 2 mg: 2 mg | ORAL | @ 02:00:00 | Stop: 2021-02-10

## 2021-01-30 MED ADMIN — succinylcholine chloride (ANECTINE) injection: INTRAVENOUS | @ 12:00:00 | Stop: 2021-01-30

## 2021-01-30 MED ADMIN — propofoL (DIPRIVAN) injection: INTRAVENOUS | @ 13:00:00 | Stop: 2021-01-30

## 2021-01-30 MED ADMIN — ondansetron (ZOFRAN) tablet 4 mg: 4 mg | ORAL | @ 20:00:00 | Stop: 2021-01-30

## 2021-01-30 MED ADMIN — ondansetron (ZOFRAN) injection: INTRAVENOUS | @ 13:00:00 | Stop: 2021-01-30

## 2021-01-30 NOTE — Unmapped (Signed)
Please see my operative note

## 2021-01-30 NOTE — Unmapped (Shared)
Internal Medicine (MEDL) Progress Note    Assessment & Plan:   Kamill Fulbright is a 70 y.o. 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 *      Rectal pain - Exophytic anal lesion likely Condyloma - improving  Patient is followed by [facility] dermatology for lesion. Has been present for ~1 year (since before her CLL diagnosis). Last shaved by derm two months ago. Experienced acute worsening of pain over last 4 days. Appearance is consistent with condyloma which makes sense in setting of immunocompromise due to advanced CLL. Lesion has slight bleeding (she estimates 1Tbl)  - PO dilauded PRN with IV for breakthrough  - colorectal surgery c/s, planning fof anorectal EUA and biopsy 4/11 (NPOpMN)  - 1u platelets and pRBCs ordered to be prepared for OR  - f/u MRI pelvis  - Follow-up with palliative care as outpatient     Anemia (BL 9?) - Thrombocytopenia (BL50s?) - CLL  In setting of advanced CLL, this could be due to any combination of a number of factors including bone marrow infiltration, autoimmune destruction, splenic sequestration, or drug toxicities. Her hemoglobin and platelets have been trending down since January 2021 but have been relatively stable for the last month. However, in setting of oozing exophytic anal mass there is concern for blood loss anemia as well.  - HOLD Zanubrutinib (Husband gave patient last dose 4/7 PM), per onc  - Transfuse below Hgb 7 or if symptomatic  - Daily CBC    Cognitive Impairment: SLUMS performed with score of 15/30, consistent with underlying diagnosis of dementia. Not previously diagnosed or mentioned.   - Consider referral to geri clinic to establish on discharge  - encourage speaking with husband, [name], for any major decisions      Petechial Rash on Face (stable): See picture in chart. One day history. No similar rashes in the past. Possibly drug related vs trauma although reports no head trauma to our team. Suspect related to Zanubritinib as a frequent side effect. Onc's last note agrees. CTM     Type II diabetes Not currently on home regimen.   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 She notes that she missed her HD session today. Creatinine of 6.42 on admission. EDW 50. Usually TTHS(?)  - nephro following   - due for next mircera on 4/12 if still in hospital (will plan to give comparable dose of epogen)    Daily Checklist:  Diet: Regular Diet  DVT PPx: Contradindicated - Thrombocytopenic  Electrolytes: No Repletion Needed  Code Status: Full Code  Dispo: pending consultant evaluation of anal condyloma    Team Contact Information:   Primary Team: Internal Medicine (MEDL)  Primary Resident: Marrianne Mood, MS4  Resident's Pager: [phone] (Gen MedL Intern - Cliffton Asters)    Interval History:   ***  ROS: Denies headache,  chest pain, shortness of breath, abdominal pain, nausea, vomiting.    Objective:   Temp:  [36.5 ??C (97.7 ??F)-37.1 ??C (98.8 ??F)] 36.5 ??C (97.7 ??F)  Heart Rate:  [84-98] 87  Resp:  [14-18] 14  BP: (124-195)/(53-88) 195/88  SpO2:  [95 %-100 %] 95 %    Gen: WDWN female in NAD, answers questions appropriately, lying in bed  Eyes: sclera anicteric  HENT: atraumatic, MMM, OP w/o erythema or exudate   Lungs: normal work of breathing on room air   Abdomen: Normoactive bowel sounds, soft, NTND, no rebound/guarding  Extremities: no clubbing, cyanosis, or edema in the BLEs  Skin: petechial/purpuric rash mostly around R eye, some in T zone  Psych: Alert, oriented, appropriate mood and affect, sometimes repeats words or phrases    Labs/Studies: Labs and Studies from the last 24hrs per EMR and Reviewed

## 2021-01-30 NOTE — Unmapped (Signed)
Cristina Fields is a 70 y.o. 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 She is s/p EUA and biopsy this morning.    - ok to discharge from a surgical perspective  - please have her MRI completed ASAP in the outpatient setting  - 7-10 day follow up requested for Colorectal Surgery clinic.    Shiela Mayer, MD  [facility], PGY-2

## 2021-01-30 NOTE — Unmapped (Signed)
Physician Discharge Summary [facility]  [address] [facility]  [address]  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 *      Hospital Course:   Emony Dormer is a 70 y.o. female with a PMHx of ESRD on dialysis, type 2 diabetes, CLL on zanubrutinib, and anal condyloma that presented to [facility] with severe anal pain, anemia, diarrhea, and vomiting.     Rectal pain - Exophytic anal lesion likely Condyloma  Patient is followed by [facility] dermatology for lesion. Has been present for ~1 year (since before her CLL diagnosis). Last shaved by derm two months ago. Experienced acute worsening of pain over last 4 days. Appearance is consistent with condyloma which makes sense in setting of being immunocompromised due to advanced CLL. Although, concerning of enlargement in size and intermittent bleeding leading to decision for biopsy. Pain improved with resolution of diarrhea, however colorectal surgery was concerned for underlying cancer. They also noted that she is high risk for poor healing given her CLL and chemotherapy. Performed exam under anesthesia and biopsy for better characterization of lesion. Follow-up set with colorectal surgery. Obtain MRI of mass ASAP.     Diarrhea and N/V 2/2 pain vs infectious diarrhea - resolved  Patient initially presented with concerns of N/V/diarrhea, however quickly resolved during first day of hospital admission. Given immunocompromise there is concern for infectious process but could also be sequelae of severe pain. Concern for obstruction was low as patient endorses no difficulty passing BM although mass is painful to pass stool through. No fevers/chills/leukocytosis and abdomen soft throughout stay. While inpatient she had no further stools with supportive care and GIPP was never sent.     Anemia (BL 9?) - Thrombocytopenia (BL50s?) - CLL  On admission, anemia and thrombocytopenia was reportedly lower than baseline per oncology notes. In setting of advanced CLL, this could be due to any combination of a number of factors including bone marrow infiltration, autoimmune destruction, splenic sequestration, or drug toxicities. Her hemoglobin and platelets have been trending down since January 2021 but have been relatively stable for the last month. During admission, Zanubrutinib was held per onc and she did not require any transfusions. She continues to hold apixaban and aspirin in setting of risk of bleeding with Zanubrutinib. Discussed with primary oncologist for her to hold Zanubrutinib until follow-up her Dr. Lonni Fix, which will be this week (4/15). Discussed with patient's husband via phone 4/12 after discussing with Dr. Lonni Fix as a correction to the initial medication reconciliation.  Petechial Rash on Face: See picture in chart. Developed day prior to admission. No similar rashes in the past. Suspect related to Zanubritinib as a frequent side effect. Onc's last note agrees.     Type II diabetes: Not currently on home regimen. Glucose to 238 on admission. HgbA1c 5.8% in March [address]. Required insulin sporadically during admission.     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 Continued HD while inpatient.    Hx of stroke: Has been holding Eliquis and aspirin while on Zanubrutinib, held at during hospitalization and at discharge and discussed with patient (4/11) and husband (4/13) via phone as a correction to the initial medication reconciliation. She has not been taking any of these medications.    The patient's hospital stay has been complicated by the following clinically significant conditions requiring additional evaluation and treatment or having a significant effect of this patient's care: - Thrombocytopenia POA requiring further investigation or monitor     Outpatient Provider Follow Up Issues:   [ ]  PCP or Onc recheck CBC  [ ]  MRI of pelvis  [ ]  Follow up with colorectal surgery  [ ]  Oncology - low IgG, when to restart Zanubrutinib (appt 4/15) pending labs  [ ]  Oncology and CRC will need to coordinate possible resection/surgical planning given her CLL    Touchbase with Outpatient Provider:  Warm Handoff: Completed on 01/30/21 by Abigail Butts  via Epic (Dr. Lonni Fix)    Procedures:  biopsy: anal mass and rectal exam under anesthesia  ______________________________________________________________________  Discharge Medications:     Your Medication List        STOP taking these medications      apixaban 5 mg Tab  Commonly known as: ELIQUIS     aspirin 81 MG tablet  Commonly known as: ECOTRIN     BRUKINSA capsule  Generic drug: zanubrutinib     oxyCODONE 5 mg capsule  Commonly known as: OXY-IR     pantoprazole 40 MG tablet  Commonly known as: PROTONIX     traMADoL 50 mg tablet  Commonly known as: ULTRAM     UNABLE TO FIND            START taking these medications      acetaminophen 500 MG tablet  Commonly known as: TYLENOL  Take 2 tablets (1,000 mg total) by mouth every eight (8) hours.     HYDROmorphone 2 MG tablet  Commonly known as: DILAUDID  Take 0.5 tablets (1 mg total) by mouth every four (4) hours as needed for pain,moderate (4-6) for up to 5 days.     polyethylene glycol 17 gram/dose powder  Commonly known as: GLYCOLAX  Take 17 g by mouth two (2) times a day as needed (for constipation).            CONTINUE taking these medications      amLODIPine 10 MG tablet  Commonly known as: NORVASC  Take 1 tablet by mouth daily.     atorvastatin 40 MG tablet  Commonly known as: LIPITOR  Take 40 mg by mouth daily.     carvediloL 12.5 MG tablet  Commonly known as: COREG  Take 12.5 mg by mouth Two (2) times a day.     hydrALAZINE 10 MG tablet  Commonly known as: APRESOLINE  Take 10 mg by mouth Three (3) times a day.     hydrocortisone 1 % cream  Apply to affected area 2 times daily  lidocaine 5 % ointment  Commonly known as: XYLOCAINE  Apply topically two (2) times a day as needed. Apply to rectum topically as needed for pain for up to two times a day     loratadine 10 mg tablet  Commonly known as: CLARITIN  Take 1 tablet (10 mg total) by mouth daily.     losartan 100 MG tablet  Commonly known as: COZAAR  Take 100 mg by mouth daily.     MIRCERA INJ  60 mcg.     NIFEdipine 60 MG 24 hr tablet  Commonly known as: ADALAT CC  Take 60 mg by mouth daily.     ondansetron 8 MG tablet  Commonly known as: ZOFRAN  Take 1 tablet by mouth every eight (8) hours.     sertraline 100 MG tablet  Commonly known as: ZOLOFT  Take 100 mg by mouth daily.     zinc oxide 20 % ointment  Apply 1 application topically as needed for dry skin. Apply to rectum topically every day for pain              Allergies:  Lisinopril  ______________________________________________________________________  Pending Test Results:  Pending Labs       Order Current Status    Surgical pathology exam In process            Most Recent Labs:  All lab results last 24 hours -   Recent Results (from the past 24 hour(s))   POCT Glucose    Collection Time: 01/30/21  1:18 PM   Result Value Ref Range    Glucose, POC 121 70 - 179 mg/dL   Prepare RBC    Collection Time: 01/31/21  3:58 AM Result Value Ref Range    Crossmatch Compatible     Unit Blood Type B Pos     ISBT Number 7300     Unit # [id number]     Status Released to Avail     Spec Expiration [id number]     Product ID Red Blood Cells     PRODUCT CODE [id number]        Relevant Studies/Radiology:  ECG 12 Lead    Result Date: 01/27/2021  NORMAL SINUS RHYTHM POSSIBLE LEFT ATRIAL ENLARGEMENT POOR R WAVE PROGRESSION IN ANTERIOR PRECORDIAL LEADS NON-SPECIFIC ST/T WAVE CHANGES ABNORMAL ECG NO PREVIOUS ECGS AVAILABLE Confirmed by Christella Noa ([id number]) on 01/27/2021 6:26:56 AM     ______________________________________________________________________  Discharge Instructions:   Activity Instructions       Activity as tolerated                         Follow Up instructions and Outpatient Referrals     Ambulatory referral to Home Health      Is this a [facility] or [facility] Patient?: Yes    Do you want agency provider parameter notifications or patient specific   provider parameter notifications?: Agency    Do you want to initiate remote patient monitoring?: No    Physician to follow patient's care: PCP    Disciplines requested:  Nursing  Physical Therapy  Occupational Therapy       Nursing requested: Other: (please enter in comments) Comment - skilled   assessment, vital signs, medication management    Physical Therapy requested:  Evaluate and treat  Strengthening exercises       Occupational Therapy Requested:  Evaluate and treat  ADL or IADL training  Requested SOC Date: 02/01/2021    Ambulatory referral to Palliative Care      Call MD for:  difficulty breathing, headache or visual disturbances      Call MD for:  persistent nausea or vomiting      Call MD for:  severe uncontrolled pain      Call MD for:  temperature >38.5 Celsius      Discharge instructions          Appointments which have been scheduled for you      Feb 03, 2021 12:00 PM  (Arrive by 11:30 AM)  LAB ONLY [place] with ADULT ONC LAB  [facility] ADULT ONCOLOGY LAB DRAW STATION [facility] REGION) [address]  [place] [address]  [phone]          Feb 03, 2021  1:00 PM  (Arrive by 12:30 PM)  RETURN ACTIVE [place] with Pernell Dupre, MD  [facility] HEMATOLOGY ONCOLOGY 2ND FLR CANCER HOSP [facility] REGION) [address]  [place] [address]  [phone]          Feb 13, 2021  1:30 PM  (Arrive by 1:00 PM)  RETURN  GENERAL with Estelle June, MD  [facility] MULTISPECIALTY SURGERY GI SURGERY [place] [facility] REGION) [address]  [place] [address]  [phone]          Feb 27, 2021 12:00 PM  (Arrive by 11:30 AM)  LAB ONLY [place] with ADULT ONC LAB  [facility] ADULT ONCOLOGY LAB DRAW STATION [place] [facility] REGION) [address]  [place] [address]  [phone]          Feb 27, 2021  1:00 PM  (Arrive by 12:30 PM)  RETURN ACTIVE  with Lenon Ahmadi, [place]  [place] HEMATOLOGY ONCOLOGY 2ND FLR CANCER HOSP [facility] REGION) [address]  [place] [address]  [phone]               ______________________________________________________________________  Discharge Day Services:  BP 145/72  - Pulse 79  - Temp 36 ??C (96.8 ??F) (Temporal)  - Resp 19  - Ht 149.9 cm (4' 11)  - Wt 51.3 kg (113 lb 1.5 oz)  - SpO2 97%  - BMI 22.84 kg/m??     Pt seen on the day of discharge and determined appropriate for discharge.    Condition at Discharge: good    Length of Discharge: I spent greater than 30 mins in the discharge of this patient.    I attest that I have reviewed the student note and that the components of the history of the present illness, the physical exam, and the assessment and plan documented were performed by me or were performed in my presence by the student where I verified the documentation and performed (or re-performed) the exam and medical decision making.     Theodis Shove, MD MPH  Internal Medicine, PGY-1

## 2021-01-30 NOTE — Unmapped (Signed)
Surgery Consult Progress Note    Hospital Day: 5  Assessment:     Cristina Fields is a 70 y.o. 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 We are taking her to the OR today for EUA and biopsy.    Interval Events:   No acute events overnight. Plts at 44 yesterday. Hgb 7.0. VS wnl. Pain is currently manageable.     Plan:     - consented for anorectal EUA with biopsy  - prep 1 unit platelets and 1 unit of RBC  - NPO for OR    Objective:      Vital Signs:  BP 153/62  - Pulse 96  - Temp 36.9 ??C (Oral)  - Resp 16  - Ht 149.9 cm (4' 11)  - Wt 51.3 kg (113 lb 1.5 oz)  - SpO2 96%  - BMI 22.84 kg/m??     Input/Output:  I/O  Timeline      04/09 0701  04/10 0700 04/10 0701  04/11 0700 04/11 0701  04/12 0700    P.O. 480 800     I.V. (mL/kg) 10 (0.2)      Total Intake 490 800     Other 700      Total Output(mL/kg) 700 (13.6)      Net -210 +800            Urine Occurrence  3 x     Stool Occurrence  1 x           Physical Exam:    General: Well appearing female , looks comfortable in bed  HEENT: Normocephalic, atraumatic  Pulmonary: Normal work of breathing, equal bilateral chest rise, conversing easily on RA  Cardiovascular: Regular rate and rhythm  Abdomen: Soft, non-distended, non-tender.  Rectal: deferred  Extremities: Moving all 4 extremities spontaneously against gravity.    Labs: reviewed    Imaging: MRI not completed yet      Crissie Reese, MD  [facility] Surgery - PGY 2  P# [phone]

## 2021-01-30 NOTE — Unmapped (Signed)
Vitals within baseline. A&Ox4. Afebrile. Biopsies completed, scant drainage. PRN Dilaudid and Zofran given 1x. Accuchecks done per order, no insulin needed. Husband at bedside. Discharge teaching provided. Pt discharged.    Vitals:    01/30/21 0921 01/30/21 0930 01/30/21 0945 01/30/21 1035   BP: 179/68 163/92 168/62 145/72   Pulse: 79 79 77 79   Resp: 19 14 19     Temp: 36 ??C (96.8 ??F)      TempSrc: Temporal      SpO2: 99% 94% 97%    Weight:       Height:            Problem: Adult Inpatient Plan of Care  Goal: Plan of Care Review  Outcome: Resolved  Goal: Patient-Specific Goal (Individualized)  Outcome: Resolved  Goal: Absence of Hospital-Acquired Illness or Injury  Outcome: Resolved  Intervention: Identify and Manage Fall Risk  Recent Flowsheet Documentation  Taken 01/30/2021 1035 by Clifton Custard, RN  Safety Interventions:   bed alarm   fall reduction program maintained   lighting adjusted for tasks/safety   low bed  Intervention: Prevent Skin Injury  Recent Flowsheet Documentation  Taken 01/30/2021 1035 by Clifton Custard, RN  Skin Protection: adhesive use limited  Intervention: Prevent and Manage VTE (Venous Thromboembolism) Risk  Recent Flowsheet Documentation  Taken 01/30/2021 1035 by Clifton Custard, RN  Activity Management: activity adjusted per tolerance  VTE Prevention/Management: ambulation promoted  Goal: Optimal Comfort and Wellbeing  Outcome: Resolved  Goal: Readiness for Transition of Care  Outcome: Resolved  Goal: Rounds/Family Conference  Outcome: Resolved     Problem: Self-Care Deficit  Goal: Improved Ability to Complete Activities of Daily Living  Outcome: Resolved     Problem: Infection  Goal: Absence of Infection Signs and Symptoms  Outcome: Resolved  Intervention: Prevent or Manage Infection  Recent Flowsheet Documentation  Taken 01/30/2021 1035 by Clifton Custard, RN  Isolation Precautions: protective precautions maintained     Problem: Fall Injury Risk  Goal: Absence of Fall and Fall-Related Injury  Outcome: Resolved  Intervention: Promote Injury-Free Environment  Recent Flowsheet Documentation  Taken 01/30/2021 1035 by Clifton Custard, RN  Safety Interventions:   bed alarm   fall reduction program maintained   lighting adjusted for tasks/safety   low bed     Problem: Impaired Wound Healing  Goal: Optimal Wound Healing  Outcome: Resolved  Intervention: Promote Wound Healing  Recent Flowsheet Documentation  Taken 01/30/2021 1035 by Clifton Custard, RN  Activity Management: activity adjusted per tolerance

## 2021-01-30 NOTE — Unmapped (Signed)
Operative Note  Date of operation: 01/30/2021  Admit Date: 01/26/2021  Performing Service: General Surgery  Surgeon(s) and Role:     * Estelle June, MD - Primary     * Roby Lofts, MD - Resident - Assisting    Pre-procedure diagnosis: Anal/perianal condyloma  Post-procedure diagnosis: Same    Procedure:  1.  Exam under anesthesia with biopsies    Attending Surgeon: Spero Curb, MD  Anesthesia: General  Estimated Blood Loss: 2 mL      Indication: The patient is a 70 year old female with a history of CLL and longstanding history of condyloma.  She was admitted to the hospital with pancytopenia as well as worsening perianal pain.  As were  unable to perform a bedside exam due to pain we plan to take her for exam under anesthesia with biopsies to assess her perianal and anal disease.    Operative findings:  1.  Extensive condyloma extending in all directions from just proximal to the dentate line to 5 to 6 cm external.  Condyloma extending to vaginal vault anteriorly.    Procedure description:  The patient was taken to the operating room and placed in the supine position.  A time out was performed confirming the correct patient, surgery, operative site, pre-operative antibiotics, as well as review of allergies.  The surgical, anesthesia, and nursing teams were present for the timeout.  Following this the patient underwent induction with general endotracheal anesthesia.  She was placed in the high lithotomy position and prepped and draped in the usual sterile fashion.  We began with an external exam which revealed circumferential perianal condyloma extending approximately 5 to 6 cm in all directions.  Anteriorly this did extend towards the vaginal introitus.  Next we performed a digital rectal exam.  There were no overt masses or lesions higher up in anal canal.  We then inserted a bivalve retractor.  The condyloma appeared to extend proximal to the dentate line and all quadrants however was within 1 cm in all directions.  We then began to take biopsies of any areas that appeared more suspicious for underlying malignancy.  These are listed below in the specimen section.  Following this these areas were fulgurated to assure hemostasis.  Next local anesthesia was administered at all sites of biopsies.  At the end of the procedure all instrument and sponge counts were correct.  I was present for and scrubbed for the entire procedure performing all critical portions.    Complications: None    Specimens:   ID Type Source Tests Collected by Time Destination   1 : Anterior Anal Margin Tissue Anus SURGICAL PATHOLOGY EXAM Estelle June, MD 01/30/2021 0848    2 : Left Posterior Anal Margin Tissue Anus SURGICAL PATHOLOGY EXAM Estelle June, MD 01/30/2021 0850    3 : Right Posterior Anal Margin Tissue Anus SURGICAL PATHOLOGY EXAM Estelle June, MD 01/30/2021 [phone]    4 : Posterior Anal Margin Tissue Anus SURGICAL PATHOLOGY EXAM Estelle June, MD 01/30/2021 0855    5 : Left Anal Margin Tissue Anus SURGICAL PATHOLOGY EXAM Estelle June, MD 01/30/2021 0900            Cristina Fields   Date: 01/30/2021  Time: 9:16 AM

## 2021-01-31 ENCOUNTER — Telehealth: Payer: Self-pay

## 2021-01-31 NOTE — Unmapped (Signed)
Patient Cristina Fields was contacted today regarding scheduled appt.'s for 02/03/2021.  Voicemail was left for patient with information and to call back with any questions or concerns.

## 2021-01-31 NOTE — Unmapped (Signed)
Triage RN returned call to Cristina Fields to advise that Dr Lonni Fix has said they should come [facility] and [facility] will [facility] them even if they are late.  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

## 2021-01-31 NOTE — Unmapped (Signed)
Tawni Levy contacted the [employer] requesting to speak with the care team of Innocence Schlotzhauer to discuss:    Doctors in hospital said she should stop cancer medication until she see's dr. Lonni Fix.  They don't think they will make their appointment on 4/15.  He wants to clarify with our team what they should be doing about the medication    Please contact at [phone].    Program: Heme Malignancy  Speciality: Medical Oncology    Check Indicates criteria has been reviewed and confirmed with the patient:    []  Preferred Name   [x]  DOB and/or MR#  [x]  Preferred Contact Method  [x]  Phone Number(s)   []  MyChart     Thank you,   Vernie Ammons  [facility] Cancer Communication [facility]   [phone]

## 2021-01-31 NOTE — Unmapped (Signed)
AOC Triage Note     Patient: Cristina Fields     Reason for call:  return call    Time call returned: 1417     Phone Assessment: Doctor Justice Deeds who was one of the in patient doctors called the pt today to instruct them not to give the zanubrutinib until after they have met with Dr Lonni Fix.  The pt has not had it since last Thursday.      An appointment has been scheduled with Dr Lonni Fix for this Friday (4/15) but the pt has a 0900 appt at the [facility] for pain and she has been waiting on this appt for 3 months.  They are going to the appt at the [place] but think they may be able to make both.    Goal for this communication:  Should they or should they not be taking the zanubrutinib?  If not, what is the alternative?     Triage Recommendations: RN will forward questions to team and someone will call them back with further instructions.

## 2021-01-31 NOTE — Telephone Encounter (Signed)
Phone call placed to patient to check s/p hospitalization and offer to schedule a follow up with Palliative NP. VM left with call back information.

## 2021-02-03 ENCOUNTER — Other Ambulatory Visit: Admit: 2021-02-03 | Discharge: 2021-02-04 | Payer: MEDICARE

## 2021-02-03 ENCOUNTER — Ambulatory Visit
Admit: 2021-02-03 | Discharge: 2021-02-04 | Payer: MEDICARE | Attending: Hematology & Oncology | Primary: Hematology & Oncology

## 2021-02-03 ENCOUNTER — Ambulatory Visit: Admit: 2021-02-03 | Discharge: 2021-02-04 | Payer: MEDICARE

## 2021-02-03 DIAGNOSIS — D696 Thrombocytopenia, unspecified: Secondary | ICD-10-CM | POA: Diagnosis not present

## 2021-02-03 DIAGNOSIS — C9112 Chronic lymphocytic leukemia of B-cell type in relapse: Secondary | ICD-10-CM | POA: Diagnosis not present

## 2021-02-03 DIAGNOSIS — R21 Rash and other nonspecific skin eruption: Secondary | ICD-10-CM | POA: Diagnosis not present

## 2021-02-03 DIAGNOSIS — D649 Anemia, unspecified: Secondary | ICD-10-CM | POA: Diagnosis not present

## 2021-02-03 DIAGNOSIS — Z79899 Other long term (current) drug therapy: Secondary | ICD-10-CM | POA: Diagnosis not present

## 2021-02-03 DIAGNOSIS — C9110 Chronic lymphocytic leukemia of B-cell type not having achieved remission: Secondary | ICD-10-CM | POA: Diagnosis not present

## 2021-02-03 DIAGNOSIS — I10 Essential (primary) hypertension: Secondary | ICD-10-CM | POA: Diagnosis not present

## 2021-02-03 DIAGNOSIS — A630 Anogenital (venereal) warts: Secondary | ICD-10-CM | POA: Diagnosis not present

## 2021-02-03 LAB — CBC W/ AUTO DIFF
BASOPHILS ABSOLUTE COUNT: 0 10*9/L (ref 0.0–0.1)
BASOPHILS RELATIVE PERCENT: 0.7 %
EOSINOPHILS ABSOLUTE COUNT: 0.1 10*9/L (ref 0.0–0.5)
EOSINOPHILS RELATIVE PERCENT: 1 %
HEMATOCRIT: 22.3 % — ABNORMAL LOW (ref 34.0–44.0)
HEMOGLOBIN: 7.4 g/dL — ABNORMAL LOW (ref 11.3–14.9)
LYMPHOCYTES ABSOLUTE COUNT: 1.9 10*9/L (ref 1.1–3.6)
LYMPHOCYTES RELATIVE PERCENT: 32 %
MEAN CORPUSCULAR HEMOGLOBIN CONC: 33.1 g/dL (ref 32.0–36.0)
MEAN CORPUSCULAR HEMOGLOBIN: 27.6 pg (ref 25.9–32.4)
MEAN CORPUSCULAR VOLUME: 83.5 fL (ref 77.6–95.7)
MEAN PLATELET VOLUME: 9.4 fL (ref 6.8–10.7)
MONOCYTES ABSOLUTE COUNT: 0.5 10*9/L (ref 0.3–0.8)
MONOCYTES RELATIVE PERCENT: 9.4 %
NEUTROPHILS ABSOLUTE COUNT: 3.3 10*9/L (ref 1.8–7.8)
NEUTROPHILS RELATIVE PERCENT: 56.9 %
PLATELET COUNT: 51 10*9/L — ABNORMAL LOW (ref 150–450)
RED BLOOD CELL COUNT: 2.67 10*12/L — ABNORMAL LOW (ref 3.95–5.13)
RED CELL DISTRIBUTION WIDTH: 16.1 % — ABNORMAL HIGH (ref 12.2–15.2)
WBC ADJUSTED: 5.9 10*9/L (ref 3.6–11.2)

## 2021-02-03 LAB — SLIDE REVIEW

## 2021-02-03 LAB — COMPREHENSIVE METABOLIC PANEL
ALBUMIN: 3.1 g/dL — ABNORMAL LOW (ref 3.4–5.0)
ALKALINE PHOSPHATASE: 71 U/L (ref 46–116)
ALT (SGPT): 17 U/L (ref 10–49)
ANION GAP: 9 mmol/L (ref 5–14)
AST (SGOT): 21 U/L (ref <=34)
BILIRUBIN TOTAL: 0.4 mg/dL (ref 0.3–1.2)
BLOOD UREA NITROGEN: 22 mg/dL (ref 9–23)
BUN / CREAT RATIO: 5
CALCIUM: 8.4 mg/dL — ABNORMAL LOW (ref 8.7–10.4)
CHLORIDE: 95 mmol/L — ABNORMAL LOW (ref 98–107)
CO2: 31 mmol/L (ref 20.0–31.0)
CREATININE: 4.28 mg/dL — ABNORMAL HIGH
EGFR CKD-EPI AA FEMALE: 11 mL/min/{1.73_m2} — ABNORMAL LOW (ref >=60)
EGFR CKD-EPI NON-AA FEMALE: 10 mL/min/{1.73_m2} — ABNORMAL LOW (ref >=60)
GLUCOSE RANDOM: 162 mg/dL (ref 70–179)
POTASSIUM: 3.4 mmol/L (ref 3.4–4.8)
PROTEIN TOTAL: 5.5 g/dL — ABNORMAL LOW (ref 5.7–8.2)
SODIUM: 135 mmol/L (ref 135–145)

## 2021-02-03 NOTE — Unmapped (Signed)
24G IV placed right arm with saline lock. Blood drawn and sent to lab. Attempts x1. Patient tolerated well.

## 2021-02-03 NOTE — Unmapped (Signed)
Patient just had colorectal surgery at the [place] today. Rates pain at her rectum and when she moves a 9/10.

## 2021-02-03 NOTE — Unmapped (Signed)
Follow Up Visit Note    Patient Name: Cristina Fields  Patient Age: 70 y.o.  Encounter Date: 02/03/2021      Chief complaint/Reason for visit: Rel/ref CLL in pt with ESRD      Assessment:  Cristina Fields is a 70 y.o. female who presents for follow up of relapsed/refractory CLL.    Therapy: zanubrutinib 160mg  ONCE daily - chosen as safest option with dialysis and PPI use. See 1/14 note for discussion of treatment options.  Treatment indication: significant drenching night sweats, anemia and thrombocytopenia.    She has now been on zanubrutinib for appx 3 months. Anemia and thrombocytopenia have worsened. Challenging to determine cause - could be non-response of CLL though her WBC has come down nicely. Could be zanu tox. Could also be unrelated to both CLL and zanu. Recommend a BM bx to help determine.    Given at risk for bleeding in setting of recent procedure for condyloma, recommend to cut dose of zanu to 3x a week - instructed pt to take AFTER dialysis only on dialysis days. Will reevaluate dosing after we get the BM bx results.    Symptomatic anemia today - will transfuse 1 unit of RBC slowly (over 2 hours) given risk for volume overload in setting of ESRD.     Plans and Recommendations:  1. CLL  - ok to resume zanu but decrease to 3x weekly dosing  - reiterated increased risk for bleeding on drug to pt, should report if any increase in rectal bleeding/bleeding from condyloma  - RTC for bone marrow biopsy and then clinic visit afterward to discuss results    2. Anemia/thrombocytopenia - multifactorial - CLL and renal failure vs other (?zanu tox?).   - RBC transfusion today    3. Perirectal condyloma: [facility] but no follow up for a month   - pt to see [facility] colorectal surg on 4/25  - path not yet back from 4/11 biopsy    4. Rash, likely drug-related: facial rash developed 1 week after start of zanubrutinib  - not noted today    5. HTN: mildly elevated today, have previously recommended she follow up with her nephrologist    6. Health maintenance  - vaccines:  Has gotten 3 covid vaccines and seasonal flu vaccine  - Received Evusheld 11/04/2020, may consider catch up dose at future visit if hasn't received full 300 dosing    Due to this patient's diagnosis, she is at significant risk for subsequent morbidity and/or mortality.    These services include the following elements of medical decision making:  addressing a hematologic cancer that poses a threat to life and/or bone marrow function  interpretation of diagnostic test reports or ordering of diagnostic tests and independent interpretation of diagnostic tests    I personally spent 40 minutes face-to-face and non-face-to-face in the care of this patient, which includes all pre, intra, and post visit time on the date of service.      History of Present Illness:     Cristina Fields is a 70 y.o. female who is seen in consultation at the request of Chrishawn Kring, Lurline Idol* for follow up of CLL/SLL.    Interim History  She has been hospitalized at [facility] and underwent biopsy of condyloma. Path not back yet. Saw provider at [facility] to have treatment of this area this morning-  Records not available. Reports felt well on the zanu but has held it since instructed to do  so following hospital discharge. Has mild to moderate bleeding from the condyloma.    REVIEW OF SYSTEMS:   CONSTITUTIONAL: No fevers, chills, + weight loss,+ drenching night sweats.   HEENT: Eyes: No blurred vision. ENT: No earache, sore throat or runny nose.   CARDIOVASCULAR: No chest pain, palpitations.  RESPIRATORY: No cough, shortness of breath, PND or orthopnea.   GASTROINTESTINAL: No nausea, vomiting or diarrhea.   GENITOURINARY: No dysuria, frequency or urgency.   MUSCULOSKELETAL: No muscle pain or weakness.  SKIN: No rashes or other skin complaints.  NEUROLOGIC: No headaches, paresthesias, fasciculations, seizures.  PSYCHIATRIC: No disorder of thought or mood. ENDOCRINE: No heat or cold intolerance, polyuria or polydipsia.   HEMATOLOGICAL: No easy bruising or bleeding.      Oncology History:    Oncology History Overview Note   Prior therapy:  FCR x6 at [facility] beginning in 06/2011 for rapid LDT, attained a CR     Relapse 05/2014     09/22/2015 bendamustine (90 mg/m2) began (no Ritux first cycle)  10/20/2015: BR, severe infusion reaction prompting MICU stay  11/17/15 BR, benda reduced to 70 mg/m2, ritux tolerated  01/2016 - 6th cycle BR completed   03/2016 PET consistent with response     Relapse in 2018     02/20/18 ibrutinib 140 mg daily started (dose rec by pharmacy due to CKD)     04/11/20 ibrutinib held when pt admitted for acute cerebellar infarct     04/26/20 admitted for ritux re-initiation with plan for 4 weeks in setting of rapid relapse (WBC up to 88K, PET not concerning for transformation)     05/10/20 chlorambucil started      07/12/20 cycle 3 ritux - cycle 4 declined     CLL (chronic lymphoid leukemia) in relapse (CMS-HCC)   2012 Initial Diagnosis    CLL (chronic lymphoid leukemia) in relapse (CMS-HCC)     08/04/2019 Biopsy    (Outside case: [id number], dated 08/04/2019)  Bone marrow, aspiration and biopsy  -  Normocellular bone marrow (30%) involved by chronic lymphocytic leukemia / small lymphocytic lymphoma, representing 40% of marrow cellularity by outside PAX5 immunohistochemistry       -  By outside report, cytogenetic studies were performed and interpreted as below:  Karyotype (per [facility] report): 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     09/21/2020 Biopsy      (Outside case: [id number], dated 09/21/2020)  Lymph node, left cervical, image-guided needle core biopsy  -  Chronic lymphocytic leukemia/small lymphocytic lymphoma (see Comment)  -  Negative for large cell transformation in the sampled core.         11/24/2020 - Chemotherapy    Zanubrutinib 160 mg ONCE daily           Allergies   Allergen Reactions   ??? Lisinopril Cough and Other (See Comments)     Other reaction(s): Unknown         Current Outpatient Medications   Medication Sig Dispense Refill   ??? acetaminophen (TYLENOL) 500 MG tablet Take 2 tablets (1,000 mg total) by mouth every eight (8) hours. 30 tablet 0   ??? amLODIPine (NORVASC) 10 MG tablet Take 1 tablet by mouth daily.      ??? atorvastatin (LIPITOR) 40 MG tablet Take 40 mg by mouth daily.     ??? carvediloL (COREG) 12.5 MG tablet Take 12.5  mg by mouth Two (2) times a day.     ??? hydrALAZINE (APRESOLINE) 10 MG tablet Take 10 mg by mouth Three (3) times a day.     ??? hydrocortisone 1 % cream Apply to affected area 2 times daily (Patient not taking: Reported on 01/25/2021) 30 g 0   ??? HYDROmorphone (DILAUDID) 2 MG tablet Take 0.5 tablets (1 mg total) by mouth every four (4) hours as needed for pain,moderate (4-6) for up to 5 days. 10 tablet 0   ??? lidocaine (XYLOCAINE) 5 % ointment Apply topically two (2) times a day as needed. Apply to rectum topically as needed for pain for up to two times a day     ??? loratadine (CLARITIN) 10 mg tablet Take 1 tablet (10 mg total) by mouth daily. 30 tablet 2   ??? losartan (COZAAR) 100 MG tablet Take 100 mg by mouth daily.     ??? methoxy peg-epoetin beta (MIRCERA INJ) 60 mcg. (Patient not taking: Reported on 01/25/2021)     ??? NIFEdipine (ADALAT CC) 60 MG 24 hr tablet Take 60 mg by mouth daily.     ??? ondansetron (ZOFRAN) 8 MG tablet Take 1 tablet by mouth every eight (8) hours. (Patient not taking: Reported on 01/25/2021)     ??? polyethylene glycol (GLYCOLAX) 17 gram/dose powder Take 17 g by mouth two (2) times a day as needed (for constipation). 238 g 0   ??? sertraline (ZOLOFT) 100 MG tablet Take 100 mg by mouth daily.     ??? zinc oxide 20 % ointment Apply 1 application topically as needed for dry skin. Apply to rectum topically every day for pain  (Patient not taking: Reported on 01/25/2021)       No current facility-administered medications for this visit.         Physical exam:  Vitals:    02/03/21 1428   BP: 148/59   Pulse: 84   Resp: 16   Temp: 37 ??C (98.6 ??F)   SpO2: 100%      General: Resting in no apparent distress though appears frail  HEENT:  Pupils are equal, round and reactive to light.  There is no scleral icterus and no conjunctival injection.    Lymph node exam:  No lymphadenopathy in the occipital, auricular, anterior/posterior cervical, submental, supraclavicular, axillary, inguinal regions.  Heart:  Regular rate and rhythm.  Normal S1 and S2 without S3 or S4.  There are no murmurs, gallops or rubs.  Lungs:  Breathing is unlabored and patient is speaking full sentences with ease.  No stridor.  Auscultation of lung fields reveals normal air movement without rales, rhonchi or crackles.    Gastrointestinal:  No distention or pain on palpation.  Bowel sounds are present and normal in quality.  There is no palpable hepatomegaly or splenomegaly.  No palpable masses. External rectal exam reveals large condyloma with scant blood at site of dressing from procedure earlier today.  Skin:  No rashes, petechiae or purpura.    Musculoskeletal:  Range of motion about the shoulder, elbow, hips and knees is grossly normal.   Psychiatric:  Alert and oriented to person, place, time and situation.  Range of affect is appropriate.    Neurologic:  CN II-XII are normal and symmetric. Gait is normal. Otherwise neurologic exam is grossly non-focal.  Extremities:  Appear well-perfused, with radial and dorsalis pedis pulses 2+.  There is no clubbing, edema or cyanosis.      ECOG Performance Status: 2  Orders/Results:  Labs and pathology have been reviewed and pertinent results are as follows:    Results for orders placed or performed in visit on 02/03/21   Comprehensive Metabolic Panel   Result Value Ref Range    Sodium 135 135 - 145 mmol/L    Potassium 3.4 3.4 - 4.8 mmol/L    Chloride 95 (L) 98 - 107 mmol/L    Anion Gap 9 5 - 14 mmol/L    CO2 31.0 20.0 - 31.0 mmol/L    BUN 22 9 - 23 mg/dL    Creatinine 4.54 (H) 0.60 - 0.80 mg/dL    BUN/Creatinine Ratio 5     EGFR CKD-EPI Non-African American, Female 10 (L) >=60 mL/min/1.23m2    EGFR CKD-EPI African American, Female 11 (L) >=60 mL/min/1.78m2    Glucose 162 70 - 179 mg/dL    Calcium 8.4 (L) 8.7 - 10.4 mg/dL    Albumin 3.1 (L) 3.4 - 5.0 g/dL    Total Protein 5.5 (L) 5.7 - 8.2 g/dL    Total Bilirubin 0.4 0.3 - 1.2 mg/dL    AST 21 <=09 U/L    ALT 17 10 - 49 U/L    Alkaline Phosphatase 71 46 - 116 U/L   CBC w/ Differential   Result Value Ref Range    WBC 5.9 3.6 - 11.2 10*9/L    RBC 2.67 (L) 3.95 - 5.13 10*12/L    HGB 7.4 (L) 11.3 - 14.9 g/dL    HCT 81.1 (L) 91.4 - 44.0 %    MCV 83.5 77.6 - 95.7 fL    MCH 27.6 25.9 - 32.4 pg    MCHC 33.1 32.0 - 36.0 g/dL    RDW 78.2 (H) 95.6 - 15.2 %    MPV 9.4 6.8 - 10.7 fL    Platelet 51 (L) 150 - 450 10*9/L    Neutrophils % 56.9 %    Lymphocytes % 32.0 %    Monocytes % 9.4 %    Eosinophils % 1.0 %    Basophils % 0.7 %    Absolute Neutrophils 3.3 1.8 - 7.8 10*9/L    Absolute Lymphocytes 1.9 1.1 - 3.6 10*9/L    Absolute Monocytes 0.5 0.3 - 0.8 10*9/L    Absolute Eosinophils 0.1 0.0 - 0.5 10*9/L    Absolute Basophils 0.0 0.0 - 0.1 10*9/L    Anisocytosis Slight (A) Not Present   Morphology Review   Result Value Ref Range    Smear Review Comments See Comment (A) Undefined    Polychromasia Slight (A) Not Present         Diagnosis   Date Value Ref Range Status   10/05/2020   Final    (Outside case: [id number], dated 08/04/2019)  Bone marrow, aspiration and biopsy  -  Normocellular bone marrow (30%) involved by chronic lymphocytic leukemia / small lymphocytic lymphoma, representing 40% of marrow cellularity by outside PAX5 immunohistochemistry       -  By outside report, cytogenetic studies were performed and interpreted as below:  Karyotype (per [facility] report): Abnormal female karyotype with trisomy 1.  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    This electronic signature is attestation that the pathologist personally reviewed the submitted material(s) and the final diagnosis reflects that evaluation.

## 2021-02-03 NOTE — Unmapped (Signed)
I will put you in for a blood transfusion today to help with your anemia.    To minimize the risk for bleeding, I would recommend to decrease the zanubrutinib (Brukinska) dose to where you ONLY take it 3 times a week - you should take it on dialysis days AFTER your dialysis session.    To help understand what the cause of your anemia is, I recommend doing a bone marrow biopsy. We will get that scheduled today.    Please call us if you experience:    1. Fever of 100.5 F or higher, shaking chills, drenching night sweats.  2. Any rapidly enlarging lymph node or mass  3. Unintentional weight loss  4. Any other concerning symptom     MyChart Messages  For your safety and best care, please DO NOT use MyChart messages to report symptoms. (Symptoms should be reported by calling the nurse triage line). Please use MyChart for non-urgent matters such as general questions, non-urgent prescription refills, or non-urgent scheduling issues.     ?? Please do not use MyChart for URGENT messages, as messages are only checked during regular business hours.     ?? Please note that MyChart messages may be routed a central pool and one of your provider???s team members will get back to you.  - Expect up to 3 business days for response     If you have any other questions, please do not hesitate to contact us.    Nurse Navigator: Covering hematologic malignancies navigator  Nurse Practitioner: Langley Gauss    For health related questions Monday through Friday 8 AM??? 5 PM : please call the office at [phone] and ask to speak with a nurse.  For appointment changes call: Main Clinic [phone].  Toll free number is [phone].    On Nights, Weekends and Holidays:  Call [phone] and ask for the adult hematologist/oncologist on call.      [facility]  [address]  [place], [place] [address]  [email or web address]    Results for orders placed or performed in visit on 02/03/21   Comprehensive Metabolic Panel   Result Value Ref Range    Sodium 135 135 - 145 mmol/L    Potassium 3.4 3.4 - 4.8 mmol/L    Chloride 95 (L) 98 - 107 mmol/L    Anion Gap 9 5 - 14 mmol/L    CO2 31.0 20.0 - 31.0 mmol/L    BUN 22 9 - 23 mg/dL    Creatinine 2.44 (H) 0.60 - 0.80 mg/dL    BUN/Creatinine Ratio 5     EGFR CKD-EPI Non-African American, Female 10 (L) >=60 mL/min/1.72m2    EGFR CKD-EPI African American, Female 11 (L) >=60 mL/min/1.44m2    Glucose 162 70 - 179 mg/dL    Calcium 8.4 (L) 8.7 - 10.4 mg/dL    Albumin 3.1 (L) 3.4 - 5.0 g/dL    Total Protein 5.5 (L) 5.7 - 8.2 g/dL    Total Bilirubin 0.4 0.3 - 1.2 mg/dL    AST 21 <=01 U/L    ALT 17 10 - 49 U/L    Alkaline Phosphatase 71 46 - 116 U/L   CBC w/ Differential   Result Value Ref Range    WBC 5.9 3.6 - 11.2 10*9/L    RBC 2.67 (L) 3.95 - 5.13 10*12/L    HGB 7.4 (L) 11.3 - 14.9 g/dL    HCT 02.7 (L) 25.3 - 44.0 %    MCV 83.5 77.6 - 95.7  fL    MCH 27.6 25.9 - 32.4 pg    MCHC 33.1 32.0 - 36.0 g/dL    RDW 16.1 (H) 09.6 - 15.2 %    MPV 9.4 6.8 - 10.7 fL    Platelet 51 (L) 150 - 450 10*9/L    Anisocytosis Slight (A) Not Present

## 2021-02-04 DIAGNOSIS — C9112 Chronic lymphocytic leukemia of B-cell type in relapse: Principal | ICD-10-CM

## 2021-02-04 NOTE — Unmapped (Signed)
Pt arrived to infusion. IV flushed, +BR. Consent obtained by Zakeya,NP. One unit pRBC given. IV flushed and removed intact. Pt discharged with no further needs.

## 2021-02-04 NOTE — Unmapped (Signed)
Blood and Blood Products Transfusion Consent      I have discussed with the patient the risks and benefits associated with blood and blood product transfusions. Patient had the opportunity to ask questions. Questions answered to patients satisfaction. Completed consent form to be scanned into the EMR.             Montel Culver, ANP  ADVANCED PRACTICE PROVIDER FOR INFUSION PROGRAM  [phone]  OFFICE [phone]

## 2021-02-06 ENCOUNTER — Telehealth: Payer: Self-pay

## 2021-02-06 DIAGNOSIS — C9112 Chronic lymphocytic leukemia of B-cell type in relapse: Principal | ICD-10-CM

## 2021-02-06 MED ORDER — ZANUBRUTINIB 80 MG CAPSULE
ORAL_CAPSULE | 5 refills | 0 days | Status: CP
Start: 2021-02-06 — End: ?
  Filled 2021-03-02: qty 24, 28d supply, fill #0

## 2021-02-06 NOTE — Unmapped (Signed)
Clinical Assessment Needed For: Dose Change  Medication: Brukinsa capsules  Last Fill Date/Day Supply: 01/18/21 / 30 days  Refill Too Soon until 02/10/21  Was previous dose already scheduled to fill: No    Notes to Pharmacist:

## 2021-02-06 NOTE — Telephone Encounter (Signed)
Phone call placed to patient to check in and offer to schedule a follow up visit with Palliative care. Patient shared that she was having a lot of pain and took an oxycodone today at 2 pm. It has been helpful. Patient would like to schedule visit with Palliative NP. Scheduled for 02/15/21 @ 2pm. Encouraged patient to reach out if pain worsens or with any questions or concerns.

## 2021-02-09 NOTE — Unmapped (Signed)
Hi,    Patient Cristina Fields spouse contacted the [facility] stating he needed to cancel her appts for tomorrow 02-10-2021 the infusions and the labs. Patient is not feeling well at all and has to be taken to the [facility]. Contact patients spouse back at [phone].     Marland KitchenThank you,   Noland Fordyce  [facility]  [phone]

## 2021-02-09 NOTE — Unmapped (Signed)
Referral to Outpatient Oncology Palliative Care Clinic ([facility])     01/30/21: NEW pt referral to Outpatient Palliative Care Clinic [facility]) from discharging MD Dr Sunny Schlein     Primary Dr Lonni Fix      Reason for referral:     Summary:   02/03/21 Dr Lonni Fix note :    Relapsed/refractory CLL.  ??  Therapy: zanubrutinib 160mg  ONCE daily - chosen as safest option with dialysis and PPI use. See 1/14 note for discussion of treatment options.  Treatment indication: significant drenching night sweats, anemia and thrombocytopenia.  ??  She has now been on zanubrutinib for appx 3 months. Anemia and thrombocytopenia have worsened. Challenging to determine cause - could be non-response of CLL though her WBC has come down nicely. Could be zanu tox. Could also be unrelated to both CLL and zanu. Recommend a BM bx to help determine.  ??  Given at risk for bleeding in setting of recent procedure for condyloma, recommend to cut dose of zanu to 3x a week - instructed pt to take AFTER dialysis only on dialysis days. Recent bx of condyloma. Path not back yet. Saw provider at [facility] to have treatment of this area this morning-  Records not available. Reports felt well on the zanu but has held it since instructed to do so following hospital discharge. Has mild to moderate bleeding from the condyloma.     Plan:  Appointment request sent to [facility] scheduler for next available, new patient appointment.        Allegra Lai RN BSN OCN MS  RN Clinical Coordinator  [facility] Outpatient Oncology Palliative Care [facility])  [facility]  Providers: Dr. Kathlynn Grate, Burtis Junes ANP, Dr. Ulanda Edison, Dr. Mickie Hillier, Dr Darrin Nipper, Hector Shade CPP  Phone: [phone]  Office: [phone]

## 2021-02-10 NOTE — Unmapped (Signed)
Hi,     Cristina Haywoodcontacted the [employer] requesting to speak with the care team of Cristina Fields to discuss:    Patient needs to cancel transfusion and lab draw for 02/13/2021    Please contact Javanna Patin to reschedule [phone].      Thank you,   Yolanda Bonine  [facility]   [phone]

## 2021-02-10 NOTE — Unmapped (Signed)
Please see below.

## 2021-02-10 NOTE — Unmapped (Signed)
Therapy Update Follow Up: No issues - Copay = $0

## 2021-02-13 ENCOUNTER — Ambulatory Visit
Admit: 2021-02-13 | Discharge: 2021-03-07 | Disposition: A | Discharge status: Skilled Nursing Facility | Payer: MEDICARE | Admitting: Hematology & Oncology

## 2021-02-13 ENCOUNTER — Other Ambulatory Visit
Admit: 2021-02-13 | Discharge: 2021-03-07 | Disposition: A | Discharge status: Skilled Nursing Facility | Payer: MEDICARE | Admitting: Hematology & Oncology

## 2021-02-13 DIAGNOSIS — R079 Chest pain, unspecified: Secondary | ICD-10-CM | POA: Diagnosis not present

## 2021-02-13 DIAGNOSIS — D649 Anemia, unspecified: Secondary | ICD-10-CM | POA: Diagnosis not present

## 2021-02-13 DIAGNOSIS — N186 End stage renal disease: Secondary | ICD-10-CM | POA: Diagnosis not present

## 2021-02-13 DIAGNOSIS — D631 Anemia in chronic kidney disease: Secondary | ICD-10-CM | POA: Diagnosis not present

## 2021-02-13 DIAGNOSIS — R4182 Altered mental status, unspecified: Secondary | ICD-10-CM | POA: Diagnosis not present

## 2021-02-13 DIAGNOSIS — J811 Chronic pulmonary edema: Secondary | ICD-10-CM | POA: Diagnosis not present

## 2021-02-13 DIAGNOSIS — R7881 Bacteremia: Secondary | ICD-10-CM | POA: Diagnosis not present

## 2021-02-13 DIAGNOSIS — G9203 Immune effector cell-associated neurotoxicity syndrome, grade 3: Secondary | ICD-10-CM | POA: Diagnosis not present

## 2021-02-13 DIAGNOSIS — A491 Streptococcal infection, unspecified site: Secondary | ICD-10-CM | POA: Diagnosis not present

## 2021-02-13 DIAGNOSIS — I619 Nontraumatic intracerebral hemorrhage, unspecified: Secondary | ICD-10-CM | POA: Diagnosis not present

## 2021-02-13 DIAGNOSIS — J90 Pleural effusion, not elsewhere classified: Secondary | ICD-10-CM | POA: Diagnosis not present

## 2021-02-13 DIAGNOSIS — G9340 Encephalopathy, unspecified: Secondary | ICD-10-CM | POA: Diagnosis not present

## 2021-02-13 DIAGNOSIS — M6281 Muscle weakness (generalized): Secondary | ICD-10-CM | POA: Diagnosis not present

## 2021-02-13 DIAGNOSIS — R918 Other nonspecific abnormal finding of lung field: Secondary | ICD-10-CM | POA: Diagnosis not present

## 2021-02-13 DIAGNOSIS — M19012 Primary osteoarthritis, left shoulder: Secondary | ICD-10-CM | POA: Diagnosis not present

## 2021-02-13 DIAGNOSIS — Z992 Dependence on renal dialysis: Secondary | ICD-10-CM | POA: Diagnosis not present

## 2021-02-13 DIAGNOSIS — I614 Nontraumatic intracerebral hemorrhage in cerebellum: Secondary | ICD-10-CM | POA: Diagnosis not present

## 2021-02-13 DIAGNOSIS — I330 Acute and subacute infective endocarditis: Secondary | ICD-10-CM | POA: Diagnosis not present

## 2021-02-13 DIAGNOSIS — G3184 Mild cognitive impairment, so stated: Secondary | ICD-10-CM | POA: Diagnosis not present

## 2021-02-13 DIAGNOSIS — B9689 Other specified bacterial agents as the cause of diseases classified elsewhere: Secondary | ICD-10-CM | POA: Diagnosis not present

## 2021-02-13 DIAGNOSIS — G928 Other toxic encephalopathy: Secondary | ICD-10-CM | POA: Diagnosis not present

## 2021-02-13 DIAGNOSIS — R5381 Other malaise: Secondary | ICD-10-CM | POA: Diagnosis not present

## 2021-02-13 DIAGNOSIS — A5131 Condyloma latum: Secondary | ICD-10-CM | POA: Diagnosis not present

## 2021-02-13 DIAGNOSIS — D696 Thrombocytopenia, unspecified: Secondary | ICD-10-CM | POA: Diagnosis not present

## 2021-02-13 DIAGNOSIS — D638 Anemia in other chronic diseases classified elsewhere: Secondary | ICD-10-CM | POA: Diagnosis not present

## 2021-02-13 DIAGNOSIS — R4189 Other symptoms and signs involving cognitive functions and awareness: Secondary | ICD-10-CM | POA: Diagnosis not present

## 2021-02-13 DIAGNOSIS — I081 Rheumatic disorders of both mitral and tricuspid valves: Secondary | ICD-10-CM | POA: Diagnosis not present

## 2021-02-13 DIAGNOSIS — Z4901 Encounter for fitting and adjustment of extracorporeal dialysis catheter: Secondary | ICD-10-CM | POA: Diagnosis not present

## 2021-02-13 DIAGNOSIS — G9389 Other specified disorders of brain: Secondary | ICD-10-CM | POA: Diagnosis not present

## 2021-02-13 DIAGNOSIS — E440 Moderate protein-calorie malnutrition: Secondary | ICD-10-CM | POA: Diagnosis not present

## 2021-02-13 DIAGNOSIS — I120 Hypertensive chronic kidney disease with stage 5 chronic kidney disease or end stage renal disease: Secondary | ICD-10-CM | POA: Diagnosis not present

## 2021-02-13 DIAGNOSIS — D849 Immunodeficiency, unspecified: Secondary | ICD-10-CM | POA: Diagnosis not present

## 2021-02-13 DIAGNOSIS — R590 Localized enlarged lymph nodes: Secondary | ICD-10-CM | POA: Diagnosis not present

## 2021-02-13 DIAGNOSIS — C9110 Chronic lymphocytic leukemia of B-cell type not having achieved remission: Secondary | ICD-10-CM | POA: Diagnosis not present

## 2021-02-13 DIAGNOSIS — Z0389 Encounter for observation for other suspected diseases and conditions ruled out: Secondary | ICD-10-CM | POA: Diagnosis not present

## 2021-02-13 DIAGNOSIS — C9112 Chronic lymphocytic leukemia of B-cell type in relapse: Secondary | ICD-10-CM | POA: Diagnosis not present

## 2021-02-13 DIAGNOSIS — R279 Unspecified lack of coordination: Secondary | ICD-10-CM | POA: Diagnosis not present

## 2021-02-13 DIAGNOSIS — R930 Abnormal findings on diagnostic imaging of skull and head, not elsewhere classified: Secondary | ICD-10-CM | POA: Diagnosis not present

## 2021-02-13 DIAGNOSIS — T8140XA Infection following a procedure, unspecified, initial encounter: Secondary | ICD-10-CM | POA: Diagnosis not present

## 2021-02-13 DIAGNOSIS — I10 Essential (primary) hypertension: Secondary | ICD-10-CM | POA: Diagnosis not present

## 2021-02-13 DIAGNOSIS — B954 Other streptococcus as the cause of diseases classified elsewhere: Secondary | ICD-10-CM | POA: Diagnosis not present

## 2021-02-13 LAB — COMPREHENSIVE METABOLIC PANEL
ALBUMIN: 3.1 g/dL — ABNORMAL LOW (ref 3.4–5.0)
ALKALINE PHOSPHATASE: 85 U/L (ref 46–116)
ALT (SGPT): 8 U/L — ABNORMAL LOW (ref 10–49)
ANION GAP: 13 mmol/L (ref 5–14)
AST (SGOT): 25 U/L (ref <=34)
BILIRUBIN TOTAL: 0.3 mg/dL (ref 0.3–1.2)
BLOOD UREA NITROGEN: 33 mg/dL — ABNORMAL HIGH (ref 9–23)
BUN / CREAT RATIO: 4
CALCIUM: 9.2 mg/dL (ref 8.7–10.4)
CHLORIDE: 100 mmol/L (ref 98–107)
CO2: 24 mmol/L (ref 20.0–31.0)
CREATININE: 8.31 mg/dL — ABNORMAL HIGH
EGFR CKD-EPI AA FEMALE: 5 mL/min/{1.73_m2} — ABNORMAL LOW (ref >=60)
EGFR CKD-EPI NON-AA FEMALE: 4 mL/min/{1.73_m2} — ABNORMAL LOW (ref >=60)
GLUCOSE RANDOM: 127 mg/dL (ref 70–179)
POTASSIUM: 4 mmol/L (ref 3.4–4.8)
PROTEIN TOTAL: 5.9 g/dL (ref 5.7–8.2)
SODIUM: 137 mmol/L (ref 135–145)

## 2021-02-13 LAB — TSH: THYROID STIMULATING HORMONE: 1.395 u[IU]/mL (ref 0.550–4.780)

## 2021-02-13 LAB — CBC W/ AUTO DIFF
HEMATOCRIT: 30.8 % — ABNORMAL LOW (ref 34.0–44.0)
HEMOGLOBIN: 10.1 g/dL — ABNORMAL LOW (ref 11.3–14.9)
MEAN CORPUSCULAR HEMOGLOBIN CONC: 32.7 g/dL (ref 32.0–36.0)
MEAN CORPUSCULAR HEMOGLOBIN: 27.5 pg (ref 25.9–32.4)
MEAN CORPUSCULAR VOLUME: 84.2 fL (ref 77.6–95.7)
MEAN PLATELET VOLUME: 9.7 fL (ref 6.8–10.7)
PLATELET COUNT: 61 10*9/L — ABNORMAL LOW (ref 150–450)
RED BLOOD CELL COUNT: 3.66 10*12/L — ABNORMAL LOW (ref 3.95–5.13)
RED CELL DISTRIBUTION WIDTH: 16.7 % — ABNORMAL HIGH (ref 12.2–15.2)
WBC ADJUSTED: 13.6 10*9/L — ABNORMAL HIGH (ref 3.6–11.2)

## 2021-02-13 LAB — HAPTOGLOBIN: HAPTOGLOBIN: 186 mg/dL (ref 40–280)

## 2021-02-13 LAB — IRON PANEL
IRON SATURATION: 21 %
IRON: 35 ug/dL — ABNORMAL LOW
TOTAL IRON BINDING CAPACITY: 163 ug/dL — ABNORMAL LOW (ref 250–425)

## 2021-02-13 LAB — VITAMIN B12: VITAMIN B-12: >2000 pg/mL — ABNORMAL HIGH (ref 211–911)

## 2021-02-13 LAB — FERRITIN: FERRITIN: 2051.1 ng/mL — ABNORMAL HIGH

## 2021-02-13 LAB — LACTATE DEHYDROGENASE: LACTATE DEHYDROGENASE: 872 U/L — ABNORMAL HIGH (ref 120–246)

## 2021-02-13 LAB — RETICULOCYTES
RETICULOCYTE ABSOLUTE COUNT: 71.1 10*9/L (ref 23.0–100.0)
RETICULOCYTE COUNT PCT: 2.02 % (ref 0.50–2.17)

## 2021-02-13 LAB — SMEAR FOR MD REVIEW

## 2021-02-13 LAB — HIGH SENSITIVITY TROPONIN I - SINGLE
HIGH SENSITIVITY TROPONIN I: 17 ng/L (ref <=34)
HIGH SENSITIVITY TROPONIN I: 16 ng/L (ref <=34)

## 2021-02-13 LAB — MAGNESIUM: MAGNESIUM: 2.1 mg/dL (ref 1.6–2.6)

## 2021-02-13 MED ADMIN — sodium chloride 0.9% (NS) bolus 500 mL: 500 mL | INTRAVENOUS | @ 22:00:00 | Stop: 2021-02-13

## 2021-02-13 MED ADMIN — acetaminophen (TYLENOL) tablet 650 mg: 650 mg | ORAL | @ 22:00:00 | Stop: 2021-02-13

## 2021-02-13 NOTE — Unmapped (Signed)
[facility] Clinical Assessment & Refill Coordination Note    Cristina Fields, [place]: Jun 02, 1951  Phone: [phone] (home)     All above HIPAA information was verified with patient's family member, Cristina Fields.     Was a Nurse, learning disability used for this call? No    Specialty Medication(s):   Hematology/Oncology: Brukinsa     Current Outpatient Medications   Medication Sig Dispense Refill   ??? acetaminophen (TYLENOL) 500 MG tablet Take 2 tablets (1,000 mg total) by mouth every eight (8) hours. 30 tablet 0   ??? amLODIPine (NORVASC) 10 MG tablet Take 1 tablet by mouth daily.      ??? atorvastatin (LIPITOR) 40 MG tablet Take 40 mg by mouth daily.     ??? carvediloL (COREG) 12.5 MG tablet Take 12.5 mg by mouth Two (2) times a day.     ??? doxercalciferoL 2 mcg/mL Soln Infuse into a venous catheter. Pt taking at dialysis-unknown dosage     ??? hydrALAZINE (APRESOLINE) 10 MG tablet Take 10 mg by mouth Three (3) times a day.     ??? hydrocortisone 1 % cream Apply to affected area 2 times daily (Patient not taking: Reported on 01/25/2021) 30 g 0   ??? lidocaine (XYLOCAINE) 5 % ointment Apply topically two (2) times a day as needed. Apply to rectum topically as needed for pain for up to two times a day     ??? loratadine (CLARITIN) 10 mg tablet Take 1 tablet (10 mg total) by mouth daily. 30 tablet 2   ??? losartan (COZAAR) 100 MG tablet Take 100 mg by mouth daily.     ??? methoxy peg-epoetin beta (MIRCERA INJ) 60 mcg. (Patient not taking: Reported on 01/25/2021)     ??? NIFEdipine (ADALAT CC) 60 MG 24 hr tablet Take 60 mg by mouth daily.     ??? ondansetron (ZOFRAN) 8 MG tablet Take 1 tablet by mouth every eight (8) hours. (Patient not taking: Reported on 01/25/2021)     ??? polyethylene glycol (GLYCOLAX) 17 gram/dose powder Take 17 g by mouth two (2) times a day as needed (for constipation). 238 g 0   ??? sertraline (ZOLOFT) 100 MG tablet Take 100 mg by mouth daily.     ??? zanubrutinib (BRUKINSA) capsule Take 2 capsules (160mg ) by mouth once daily three times a week after dialysis. Swallow whole with water. Do not open, break, or chew capsule. 24 capsule 5   ??? zinc oxide 20 % ointment Apply 1 application topically as needed for dry skin. Apply to rectum topically every day for pain  (Patient not taking: Reported on 01/25/2021)       No current facility-administered medications for this visit.        Changes to medications: Cristina Fields reports no changes at this time.    Allergies   Allergen Reactions   ??? Lisinopril Cough and Other (See Comments)     Other reaction(s): Unknown       Changes to allergies: No    SPECIALTY MEDICATION ADHERENCE     Brukinsa 80 mg: over 30 days of medicine on hand     Medication Adherence    Patient reported X missed doses in the last month: >5  Specialty Medication: Brukinsa 80 mg caps - 2 caps 3 x weekly on dialysis days. Take after dialysis  Patient is on additional specialty medications: No  Informant: patient  Reasons for non-adherence: instructed by provider to hold or take differently  Specialty medication(s) dose(s) confirmed: Dose changed to 2 caps (160 mg) 3 x week after dialysis     Are there any concerns with adherence? No - med was on hold by provider    Adherence counseling provided? Not needed    CLINICAL MANAGEMENT AND INTERVENTION      Clinical Benefit Assessment:    Do you feel the medicine is effective or helping your condition? Yes    Clinical Benefit counseling provided? Not needed    Adverse Effects Assessment:    Are you experiencing any side effects? Yes, patient reports experiencing welts on face, eye swelling, some itching. Side effect counseling provided: Likely not due to zanubrutinb.  Patient has been taking oxycodone due to pain from a recent procedure.  Counseled that oxycodone may cause some people to itch.  If she is allergic to it that may be causing her eye to swell, welts, etc...  Also could be due to seasonal allergies.  Recommended taking antihistiamine like cetirizine or fexofenedine daily.  If no relief then have it checked out by provider.  Also counseled to go to ER if she is starting to notice swelling in mouth, lips, tongue    Are you experiencing difficulty administering your medicine? No    Quality of Life Assessment:    How many days over the past month did your CLL  keep you from your normal activities? For example, brushing your teeth or getting up in the morning. 0    Have you discussed this with your provider? Not needed    Acute Infection Status:    Acute infections noted within Epic:  No active infections  Patient reported infection: None    Therapy Appropriateness:    Is therapy appropriate? Yes, therapy is appropriate and should be continued    DISEASE/MEDICATION-SPECIFIC INFORMATION      N/A    PATIENT SPECIFIC NEEDS     - Does the patient have any physical, cognitive, or cultural barriers? No    - Is the patient high risk? Yes, patient is taking oral chemotherapy. Appropriateness of therapy as been assessed    - Does the patient require a Care Management Plan? No     - Does the patient require physician intervention or other additional services (i.e. nutrition, smoking cessation, social work)? No      SHIPPING     Specialty Medication(s) to be Shipped:   Hematology/Oncology: None    Other medication(s) to be shipped: No additional medications requested for fill at this time     Changes to insurance: No    Delivery Scheduled: Patient declined refill at this time due to having plenty on hand.     Medication will be delivered via NA to the confirmed NA address in [facility].    The patient will receive a drug information handout for each medication shipped and additional FDA Medication Guides as required.  Verified that patient has previously received a Conservation officer, historic buildings and a Surveyor, mining.    All of the patient's questions and concerns have been addressed.    Cristina Fields Shared [facility] Specialty Pharmacist

## 2021-02-13 NOTE — Unmapped (Signed)
Hi,     Cristina Fields contacted the [employer] requesting to speak with the care team of Cristina Fields to discuss:    Would like to reschedule all infusion appointments to late morning or early afternoon. Appointments are currently scheduled too early in the morning for the patient.    Please contact Cristina Fields at [phone].      Thank you,   Yolanda Bonine  [facility]   [phone]

## 2021-02-13 NOTE — Unmapped (Unsigned)
PIV inserted and labs drawn with no complications by mary B  .  PIV flushed with saline.  Pt c/o pain.  Requested for infusion NP to be paged and for pt to have a bed if possible.

## 2021-02-13 NOTE — Unmapped (Signed)
Hi,     Cristina Fields  contacted the [employer] regarding the following:    - Pt prefers all other labs and infusion after todays visit schedule for late morning or early afternoons     Please contact [name]  at [phone].    Thanks in advance,    Keturah Shavers  [facility]   [phone]

## 2021-02-14 DIAGNOSIS — G9340 Encephalopathy, unspecified: Secondary | ICD-10-CM | POA: Diagnosis not present

## 2021-02-14 DIAGNOSIS — N186 End stage renal disease: Secondary | ICD-10-CM | POA: Diagnosis not present

## 2021-02-14 DIAGNOSIS — C9112 Chronic lymphocytic leukemia of B-cell type in relapse: Secondary | ICD-10-CM | POA: Diagnosis not present

## 2021-02-14 DIAGNOSIS — A5131 Condyloma latum: Secondary | ICD-10-CM | POA: Diagnosis not present

## 2021-02-14 DIAGNOSIS — R4182 Altered mental status, unspecified: Secondary | ICD-10-CM | POA: Diagnosis not present

## 2021-02-14 DIAGNOSIS — R930 Abnormal findings on diagnostic imaging of skull and head, not elsewhere classified: Secondary | ICD-10-CM | POA: Diagnosis not present

## 2021-02-14 DIAGNOSIS — D631 Anemia in chronic kidney disease: Secondary | ICD-10-CM | POA: Diagnosis not present

## 2021-02-14 DIAGNOSIS — I619 Nontraumatic intracerebral hemorrhage, unspecified: Secondary | ICD-10-CM | POA: Diagnosis not present

## 2021-02-14 DIAGNOSIS — Z992 Dependence on renal dialysis: Secondary | ICD-10-CM | POA: Diagnosis not present

## 2021-02-14 DIAGNOSIS — I120 Hypertensive chronic kidney disease with stage 5 chronic kidney disease or end stage renal disease: Secondary | ICD-10-CM | POA: Diagnosis not present

## 2021-02-14 LAB — HEPATIC FUNCTION PANEL
ALBUMIN: 2.6 g/dL — ABNORMAL LOW (ref 3.4–5.0)
ALKALINE PHOSPHATASE: 74 U/L (ref 46–116)
ALT (SGPT): <7 U/L — ABNORMAL LOW (ref 10–49)
AST (SGOT): 21 U/L (ref <=34)
BILIRUBIN DIRECT: 0.1 mg/dL (ref 0.00–0.30)
BILIRUBIN TOTAL: 0.2 mg/dL — ABNORMAL LOW (ref 0.3–1.2)
PROTEIN TOTAL: 5 g/dL — ABNORMAL LOW (ref 5.7–8.2)

## 2021-02-14 LAB — CBC W/ AUTO DIFF
BASOPHILS ABSOLUTE COUNT: 0.2 10*9/L — ABNORMAL HIGH (ref 0.0–0.1)
BASOPHILS RELATIVE PERCENT: 1.3 %
EOSINOPHILS ABSOLUTE COUNT: 0.2 10*9/L (ref 0.0–0.5)
EOSINOPHILS RELATIVE PERCENT: 1.4 %
HEMATOCRIT: 30.8 % — ABNORMAL LOW (ref 34.0–44.0)
HEMATOCRIT: 26.7 % — ABNORMAL LOW (ref 34.0–44.0)
HEMOGLOBIN: 10.1 g/dL — ABNORMAL LOW (ref 11.3–14.9)
HEMOGLOBIN: 8.7 g/dL — ABNORMAL LOW (ref 11.3–14.9)
LYMPHOCYTES ABSOLUTE COUNT: 10.1 10*9/L — ABNORMAL HIGH (ref 1.1–3.6)
LYMPHOCYTES RELATIVE PERCENT: 74 %
MEAN CORPUSCULAR HEMOGLOBIN CONC: 32.7 g/dL (ref 32.0–36.0)
MEAN CORPUSCULAR HEMOGLOBIN CONC: 32.6 g/dL (ref 32.0–36.0)
MEAN CORPUSCULAR HEMOGLOBIN: 27.5 pg (ref 25.9–32.4)
MEAN CORPUSCULAR HEMOGLOBIN: 27.1 pg (ref 25.9–32.4)
MEAN CORPUSCULAR VOLUME: 84.2 fL (ref 77.6–95.7)
MEAN CORPUSCULAR VOLUME: 83.2 fL (ref 77.6–95.7)
MEAN PLATELET VOLUME: 9.7 fL (ref 6.8–10.7)
MEAN PLATELET VOLUME: 9.9 fL (ref 6.8–10.7)
MONOCYTES ABSOLUTE COUNT: 0.3 10*9/L (ref 0.3–0.8)
MONOCYTES RELATIVE PERCENT: 2.1 %
NEUTROPHILS ABSOLUTE COUNT: 2.9 10*9/L (ref 1.8–7.8)
NEUTROPHILS RELATIVE PERCENT: 21.2 %
PLATELET COUNT: 61 10*9/L — ABNORMAL LOW (ref 150–450)
PLATELET COUNT: 54 10*9/L — ABNORMAL LOW (ref 150–450)
RED BLOOD CELL COUNT: 3.66 10*12/L — ABNORMAL LOW (ref 3.95–5.13)
RED BLOOD CELL COUNT: 3.2 10*12/L — ABNORMAL LOW (ref 3.95–5.13)
RED CELL DISTRIBUTION WIDTH: 16.7 % — ABNORMAL HIGH (ref 12.2–15.2)
RED CELL DISTRIBUTION WIDTH: 16.4 % — ABNORMAL HIGH (ref 12.2–15.2)
WBC ADJUSTED: 13.6 10*9/L — ABNORMAL HIGH (ref 3.6–11.2)
WBC ADJUSTED: 13.7 10*9/L — ABNORMAL HIGH (ref 3.6–11.2)

## 2021-02-14 LAB — MANUAL DIFFERENTIAL
BASOPHILS - ABS (DIFF): 0.4 10*9/L — ABNORMAL HIGH (ref 0.0–0.1)
BASOPHILS - REL (DIFF): 3 %
EOSINOPHILS - ABS (DIFF): 0.1 10*9/L (ref 0.0–0.5)
EOSINOPHILS - REL (DIFF): 1 %
LYMPHOCYTES - ABS (DIFF): 10.4 10*9/L — ABNORMAL HIGH (ref 1.1–3.6)
LYMPHOCYTES - REL (DIFF): 76 %
MONOCYTES - ABS (DIFF): 0.5 10*9/L (ref 0.3–0.8)
MONOCYTES - REL (DIFF): 4 %
NEUTROPHILS - ABS (DIFF): 2.2 10*9/L (ref 1.8–7.8)
NEUTROPHILS - REL (DIFF): 16 %

## 2021-02-14 LAB — PHOSPHORUS: PHOSPHORUS: 4.3 mg/dL (ref 2.4–5.1)

## 2021-02-14 LAB — BASIC METABOLIC PANEL
ANION GAP: 11 mmol/L (ref 5–14)
BLOOD UREA NITROGEN: 41 mg/dL — ABNORMAL HIGH (ref 9–23)
BUN / CREAT RATIO: 5
CALCIUM: 8.6 mg/dL — ABNORMAL LOW (ref 8.7–10.4)
CHLORIDE: 101 mmol/L (ref 98–107)
CO2: 24 mmol/L (ref 20.0–31.0)
CREATININE: 8.85 mg/dL — ABNORMAL HIGH
EGFR CKD-EPI AA FEMALE: 5 mL/min/{1.73_m2} — ABNORMAL LOW (ref >=60)
EGFR CKD-EPI NON-AA FEMALE: 4 mL/min/{1.73_m2} — ABNORMAL LOW (ref >=60)
GLUCOSE RANDOM: 110 mg/dL (ref 70–179)
POTASSIUM: 3.8 mmol/L (ref 3.4–4.8)
SODIUM: 136 mmol/L (ref 135–145)

## 2021-02-14 LAB — SYPHILIS SCREEN: SYPHILIS RPR SCREEN: NONREACTIVE

## 2021-02-14 LAB — SLIDE REVIEW

## 2021-02-14 LAB — PERIPHERAL BLOOD SMEAR, PATH REVIEW

## 2021-02-14 LAB — MAGNESIUM: MAGNESIUM: 1.8 mg/dL (ref 1.6–2.6)

## 2021-02-14 LAB — HIGH SENSITIVITY TROPONIN I - SINGLE: HIGH SENSITIVITY TROPONIN I: 13 ng/L (ref <=34)

## 2021-02-14 LAB — FOLATE: FOLATE: 7.8 ng/mL (ref >=5.4)

## 2021-02-14 MED ADMIN — paricalcitoL (ZEMPLAR) injection 5 mcg: 5 ug | INTRAVENOUS | @ 19:00:00

## 2021-02-14 MED ADMIN — sertraline (ZOLOFT) tablet 100 mg: 100 mg | ORAL | @ 02:00:00

## 2021-02-14 MED ADMIN — heparin (porcine) 1000 unit/mL injection 3,000 Units: 3000 [IU] | INTRAVENOUS | @ 19:00:00

## 2021-02-14 MED ADMIN — hydrALAZINE (APRESOLINE) tablet 10 mg: 10 mg | ORAL | @ 23:00:00

## 2021-02-14 MED ADMIN — hydrALAZINE (APRESOLINE) tablet 5 mg: 5 mg | ORAL | @ 10:00:00 | Stop: 2021-02-14

## 2021-02-14 MED ADMIN — oxyCODONE (ROXICODONE) immediate release tablet 5 mg: 5 mg | ORAL | @ 17:00:00 | Stop: 2021-02-27

## 2021-02-14 MED ADMIN — carvediloL (COREG) tablet 12.5 mg: 12.5 mg | ORAL | @ 02:00:00

## 2021-02-14 MED ADMIN — polyethylene glycol (MIRALAX) packet 17 g: 17 g | ORAL | @ 02:00:00

## 2021-02-14 MED ADMIN — heparin (porcine) 5,000 unit/mL injection 5,000 Units: 5000 [IU] | SUBCUTANEOUS | @ 10:00:00 | Stop: 2021-02-14

## 2021-02-14 MED ADMIN — acetaminophen (TYLENOL) tablet 1,000 mg: 1000 mg | ORAL | @ 02:00:00

## 2021-02-14 MED ADMIN — heparin (porcine) 5,000 unit/mL injection 5,000 Units: 5000 [IU] | SUBCUTANEOUS | @ 02:00:00

## 2021-02-14 MED ADMIN — carvediloL (COREG) tablet 12.5 mg: 12.5 mg | ORAL | @ 12:00:00

## 2021-02-14 MED ADMIN — acetaminophen (TYLENOL) tablet 1,000 mg: 1000 mg | ORAL | @ 18:00:00

## 2021-02-14 MED ADMIN — NIFEdipine (PROCARDIA XL) 24 hr tablet 60 mg: 60 mg | ORAL | @ 12:00:00

## 2021-02-14 MED ADMIN — polyethylene glycol (MIRALAX) packet 17 g: 17 g | ORAL | @ 12:00:00

## 2021-02-14 MED ADMIN — lidocaine (LIDODERM) 5 % patch 2 patch: 2 | TRANSDERMAL | @ 04:00:00

## 2021-02-14 MED ADMIN — metroNIDAZOLE (FLAGYL) tablet 500 mg: 500 mg | ORAL | @ 02:00:00 | Stop: 2021-02-18

## 2021-02-14 MED ADMIN — oxyCODONE (ROXICODONE) immediate release tablet 5 mg: 5 mg | ORAL | @ 02:00:00 | Stop: 2021-02-27

## 2021-02-14 MED ADMIN — vancomycin (VANCOCIN) 1000 mg in sodium chloride (NS) 0.9 % 250 mL IVPB (premade): 1000 mg | INTRAVENOUS | @ 23:00:00 | Stop: 2021-02-14

## 2021-02-14 MED ADMIN — oxyCODONE (ROXICODONE) immediate release tablet 2.5 mg: 2.5 mg | ORAL | @ 13:00:00 | Stop: 2021-02-27

## 2021-02-14 MED ADMIN — metroNIDAZOLE (FLAGYL) tablet 500 mg: 500 mg | ORAL | @ 12:00:00 | Stop: 2021-02-18

## 2021-02-14 MED ADMIN — ciprofloxacin HCl (CIPRO) tablet 500 mg: 500 mg | ORAL | @ 02:00:00 | Stop: 2021-02-18

## 2021-02-14 MED ADMIN — acetaminophen (TYLENOL) tablet 1,000 mg: 1000 mg | ORAL | @ 10:00:00

## 2021-02-14 MED ADMIN — atorvastatin (LIPITOR) tablet 40 mg: 40 mg | ORAL | @ 12:00:00

## 2021-02-14 MED ADMIN — epoetin alfa-EPBX (RETACRIT) injection 1,000 Units: 1000 [IU] | INTRAVENOUS | @ 19:00:00

## 2021-02-14 NOTE — Unmapped (Signed)
Hematology Resident (MEDE) History & Physical    Assessment & Plan:   Cristina Fields is a 70 y.o. female with CLL, ESRD on iHD, hypertension, prior cerebellar infarct that presented to [facility] as a direct admission from infusion clinic with disorientation and pain.     Active Problems:    * No active hospital problems. *  Resolved Problems:    * No resolved hospital problems. *    #Encephalopathy   DDx includes delirium 2/2 pain from possibly infected rectal wound, uremia, other metabolic insult, or less likely neurological involvement of CLL. No meningeal signs (no neck stiffness or fever, and neck pain is in anterior/lateral compartments. Intracranial hemorrhage is a reported side effect of zanubrutinib, but she does not have focal neurologic deficits.   - Follow-up TSH, B12, CXR, STAT head CT to rule out hemorrhage, UA/UCx, BCx, RPR.   - Consider non-emergent MRI/LP if mental status does not return to baseline and above workup is negative.     #Perirectal condyloma - Leukocytosis   Biopsies on 4/11 with HSIL/AIN2. MRI pelvis with contrast was ordered outpatient during last admission as recommended by colorectal surgery. However, there is risk for NSF with gadolinium. Examined with RN. Wound is raw and she is having significant pain from it, however drainage is clear and non-purulent. Given leukocytosis and altered mental status, will treat with empiric antibiotics.   - Follow-up blood cultures.   - Ciprofloxacin + metronidazole for 5 days (4/25-)   - Consider colorectal surgery consult in AM. If MRI pelvis still recommended, would obtain this inpatient vs non-contrasted imaging with lower risk for NSF.  - Pain control with scheduled tylenol, PRN oxycodone 2.5-5mg  q4h.     #Shoulder/chest pain  Possibly related to LAD. Zanubrutinib causes musculoskeletal pain in 14-45%.   - As her ability to give a history is limited by altered mental status, and she is at high risk for CAD/infection will evaluate with CXR, EKG, troponin.     #CLL  Oncology history:  Follows with Dr. Lonni Fix. Diagnosed ~2012 and received fludarabine + cyclophosphamide + rituximab x6. Relapsed in 05/2014 treated with bendamustine + rituximab. Relapsed again in 2018 and started ibrutinib in 5/201. Rapid relapse in 04/2020 and was treated with rituximab and chlorambucil through November, 2021. Currently on zanubrutinib.   Plan:  - Resume zanubrutinib in AM?   - BMBx planned outpatient. Consider performing this while she is hospitalized.     #Anemia - Thrombocytopenia   Of  Note, she received a blood transfusion on 4/15 and hgb has been stable at 10.1 on admission up from 7.4. 4/8 iron panel shows TIBC 188 (L), Tsat 9%, Ferritin 1072.7. May be related to zanubrutinib vs worsening CLL.   - Follow-up reticulocyte count, iron panel, ferritin, B12, LDH, haptoglobin, smear review   - Transfuse for hgb <7, platelets <10,000, higher if actively bleeding.     #Hypertension  - Continue home carvedilol 12.5 mg BID  - Continue home nifedipine XL 60  mg daily  - Hold home hydralazine 10 mg TID  - Hold home losartan 100 mg daily     #ESRD  Continues to make urine. Access is tunneled HD catheter.   - Reach out to nephrology in AM for dialysis   - Avoid gadolinium contrast unless absolutely necessary due to risk of NSF    #History of CVA  Hospitalized for acute cerebellar infarct in 03/2020.   - Continue atorvastatin   - Not on aspirin while on zanubrutinib  for thrombocytopenia     #T2DM  No home medications. Hgb A1c 5.8 in 12/2020.   - Trend glucose on BMP    #MDD  - Continue sertraline 100 mg daily     Daily Checklist:  Diet: Regular Diet  DVT PPx: Heparin 5000units q8h  Electrolytes: No Repletion Needed  Code Status: Full Code    Chief Concern:   No Principal Problem: There is no principal problem currently on the Problem List. Please update the Problem List and refresh.    Subjective:   HPI:  Cristina Fields is a 69 y.o. female with CLL, ESRD on iHD, hypertension, prior CVA? that presented to [facility] as a direct admission from infusion clinic with disorientation and pain.     History is extremely limited from patient. Attempted to obtain collateral from husband at 8:00PM and again at 8:45 PM but was unsuccessful. Below was obtained from chart review.     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 She has three children and has been married to Cristina Fields, her husband for the past 12 years. She was diagnosed with CLL back in 2012 and has suffered several relapses. She is currently on daily oral zanabrutinib due to symptomatic CLL (drenching sweats, anemia, and lymphocytosis). 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 Lower GI surgery was consulted and recommended biopsy to rule out anal cancer given the speed and aggressiveness of condyloma recurrence. EUA and biopsy was performed on 4/11 with path consistent with condyloma. PT notes on discharge mention that she suffered from pain-limited mobility, but was able to stand with standby assist and rollator-walker.     The patient's husband reported to infusion provider that Cristina Fields had been having pain for the past few days and has been receiving oxycodone intermittently. Her husband has been changing the condyloma site wound daily and had not noticed purulent drainage or blood. The patient currently says she is having pain in her neck, shoulders, anterior chest, and left lower quadrant. She is unable to provide further history.     Designated Healthcare Decision Maker:  Cristina Fields currently lacks decisional capacity for healthcare decision-making and is unable to designate a surrogate healthcare decision maker. Cristina Fields designated healthcare decision maker(s) is/are Cristina Fields (the patient's spouse) as denoted by hospital policy for patients without a known preference.    Allergies:  Lisinopril    Medications:   Prior to Admission medications    Medication Dose, Route, Frequency   acetaminophen (TYLENOL) 500 MG tablet 1,000 mg, Oral, Every 8 hours   amLODIPine (NORVASC) 10 MG tablet 1 tablet, Oral, Daily (standard)   atorvastatin (LIPITOR) 40 MG tablet 40 mg, Oral, Daily (standard)   carvediloL (COREG) 12.5 MG tablet 12.5 mg, Oral, 2 times a day (standard)   doxercalciferoL 2 mcg/mL Soln Intravenous, Pt taking at dialysis-unknown dosage    hydrALAZINE (APRESOLINE) 10 MG tablet 10 mg, Oral, 3 times a day (standard)   hydrocortisone 1 % cream Apply to affected area 2 times daily  Patient not taking: Reported on 01/25/2021   lidocaine (XYLOCAINE) 5 % ointment Topical, 2 times a day PRN, Apply to rectum topically as needed for pain for up to two times a day    loratadine (CLARITIN) 10 mg tablet 10 mg, Oral, Daily (standard)   losartan (COZAAR) 100 MG tablet 100 mg, Oral, Daily (  standard)   methoxy peg-epoetin beta (MIRCERA INJ) 60 mcg  Patient not taking: Reported on 01/25/2021   NIFEdipine (ADALAT CC) 60 MG 24 hr tablet 60 mg, Oral, Daily (standard)   ondansetron (ZOFRAN) 8 MG tablet 1 tablet, Every 8 hours  Patient not taking: Reported on 01/25/2021   polyethylene glycol (GLYCOLAX) 17 gram/dose powder 17 g, Oral, 2 times a day PRN   sertraline (ZOLOFT) 100 MG tablet 100 mg, Oral, Daily (standard)   zanubrutinib (BRUKINSA) capsule Take 2 capsules (160mg ) by mouth once daily three times a week after dialysis. Swallow whole with water. Do not open, break, or chew capsule.   zinc oxide 20 % ointment 1 application, As needed (once a day)  Patient not taking: Reported on 01/25/2021       Medical History:  No past medical history on file.    Surgical History:  Past Surgical History:   Procedure Laterality Date   ??? PR SURG DIAGNOSTIC EXAM, ANORECTAL N/A 01/30/2021    Procedure: ANORECTAL EUA with Biopsy;  Surgeon: Estelle June, MD;  Location: MAIN OR [facility];  Service: General Surgery       Family History:   Family History   Problem Relation Age of Onset   ??? Diabetes Mother    ??? Hyperlipidemia Mother    ??? Diabetes Father    ??? Hyperlipidemia Father        Social History:  The patient lives with family.     Social History     Tobacco Use   ??? Smoking status: Never Smoker   ??? Smokeless tobacco: Not on file   Substance Use Topics   ??? Alcohol use: Not on file   ??? Drug use: Not on file        Review of Systems:  10 systems were reviewed and are negative unless otherwise mentioned in the HPI    Objective:   Physical Exam:  Temp:  [36.5 ??C-37.1 ??C] 37.1 ??C  Heart Rate:  [76-81] 81  Resp:  [16-18] 16  BP: (142-190)/(64-77) 190/77    Gen: Elderly woman laying completely flat in infusion clinic bed in NAD. Lacks strength to sit up. Non-toxic appearing.   Eyes: Sclera anicteric, EOMI, PERRLA  HENT: atraumatic, normocephalic, MMM. OP w/o erythema or exudate   Neck: No stiffness, able to to flex neck entirely without pain and move from side to side. No palpable lymphadenopathy. Exquisite tenderness to palpation along strap muscles anteriorly.   Heart: RRR, S1, S2, no M/R/G, no chest wall tenderness  Lungs: CTAB, no crackles or wheezes, no use of accessory muscles  Abdomen: Normoactive bowel sounds, soft, minimal tenderness to palpation in LLQ, no rebound/guarding, no hepatosplenomegaly. Scar from remote hysterectomy.   Extremities: no clubbing, cyanosis, or edema: pulses are +2 in bilateral upper and lower extremities  Neuro: Oriented to self alone. Global, pain-limited weakness.  Skin:  No rashes, lesions on clothed exam  Psych: Alert, oriented, normal mood and affect.   GU/rectal: Mepilex removed and underneath was a weeping, beefy, red ulcer surrounded by extensive perirectal condyloma.     Labs/Studies/Imaging:  Labs, Studies, Imaging from the last 24hrs per EMR and personally reviewed

## 2021-02-14 NOTE — Unmapped (Signed)
Pt arrived awake, alert, but c/o moderate to severe pain on her left arm radiating to the shoulder, AVG LUE, with good bruit and thrill but + bruised and hematoma, MD aware. Proceed with planned procedure.    Hemodialysis inpatient  Every Tue,Thu,Sat      Comments: RIJ IS NOT A DIALYSIS ACCESS   Question Answer Comment   K+ 3 meq/L    Ca++ 2.5 meq/L    Bicarb 35 meq/L    Na+ 137 meq/L    Na+ Modeling no    Dialyzer F180NR    Dialysate Temperature (C) 37    BFR-As tolerated to a maximum of: 500 mL/min    DFR 800 mL/min    Duration of treatment 3.5 Hr    Dry weight (kg) 49.5 kg    Challenge dry weight (kg) no    Fluid removal (L) to edw    Tubing Adult = 142 ml    Access Site AVG    Access Site Location Left    Keep SBP >: 95

## 2021-02-14 NOTE — Unmapped (Signed)
Hematology/Oncology Brief Admission Communication Note    Cristina Fields is a 70 y.o. female with a diagnosis of relapsed/refractory CLL.      HPI/Reason for admission: She is being admitted today due to pain management and confusion. Patient comes to Oncology Infusion clinic for lab check and was found to be emotionally labile, complaining of diffuse pain. She is disoriented to place and time. She is unable to answer questions appropriately. She states she's having pain at the condyloma excision site, down her legs and her chest and bilateral arms. She is unable to describe the pain or when it started. Her husband states she's been having bouts of pain for the last several days. He has given her Oxycodone today at 1:30pm and again at 3pm (which could be attributing to her confusion). He states he has been changing the condyloma site wound at least once daily as directed and denies any purulent drainage, puss or blood. He also denies any fever, chills, sweats, cough or new rashes. Patient was inconsolable while in infusion and emotionally labile. Unable to follow simple commands.     Plan for infusion center:   If the patient has an active treatment plan, chemotherapy to be administered: yes patient is on Zanubrutinib.    Infusions or supportive care ordered to be started in infusion:   - cbc, cmp, mg  - Blood Cx x 2  - Urine Cx  - UA    Additional testing/orders to be carried out prior to admission:   - NS IVF  - tylenol 650mg  PO q6h PRN pain    The preliminary plan and goals of admission are:  - admit for pain management  - follow up infectious work up for confusion    The primary outpatient oncologist for this patient is Dr. Lonni Fix    General:               in no acute distress, chronically ill-appearing  HEENT:               conjunctiva clear, normal voice, vision intact  Cardiovascular:   warm and well-perfused. radial pulses 2+ bilaterally.  Respiratory:         no wheezes, rhonchi, or rales no accessory muscle use  Gastrointestinal:  soft and non-distended  Musculoskeletal:  no bony pain or tenderness  Extremities:          extremities are warm and without edema, no clubbing or cyanosis  Skin:                    warm, dry and no rashes, lesions or breakdown  Psychiatric:          anxious, restless and labile  Neuro:                 oriented to person. Disoriented to place and time     Lab Results   Component Value Date    WBC 13.6 (H) 02/13/2021    HGB 10.1 (L) 02/13/2021    HCT 30.8 (L) 02/13/2021    PLT 61 (L) 02/13/2021       Lab Results   Component Value Date    NA 137 02/13/2021    K 4.0 02/13/2021    CL 100 02/13/2021    CO2 24.0 02/13/2021    BUN 33 (H) 02/13/2021    CREATININE 8.31 (H) 02/13/2021    GLU 127 02/13/2021    CALCIUM 9.2 02/13/2021    MG 2.1 02/13/2021  PHOS 5.5 (H) 01/30/2021       Lab Results   Component Value Date    BILITOT 0.3 02/13/2021    PROT 5.9 02/13/2021    ALBUMIN 3.1 (L) 02/13/2021    ALT 8 (L) 02/13/2021    AST 25 02/13/2021    ALKPHOS 85 02/13/2021       No results found for: PT, INR, APTT    Erlinda Hong, PA

## 2021-02-14 NOTE — Unmapped (Signed)
PHYSICAL THERAPY  Evaluation (02/14/21 1146)      Patient Name:?? Cristina Fields????????   Medical Record Number: [id number]   Date of Birth: [birth year]  Sex: Female??????????????      Treatment Diagnosis: Impaired stregth and endurance, limited by pain, AMS     Activity Tolerance: Limited by pain, Limited by Mental Status     ASSESSMENT  Problem List: Decreased strength, Decreased range of motion, Decreased endurance, Impaired balance, Decreased mobility, Decreased cognition, Decreased skin integrity, Impaired judgement, Pain, Fall Risk, Impaired ADLs, Cognitive/Behavioral impairments      Assessment : Patient is a 70 y.o. female with CLL, ESRD on iHD, hypertension, prior cerebellar infarct that presented to [facility] as a direct admission from infusion clinic with disorientation and pain. Patient presents with decreased strength, endurance, functional mobility and cognitive status. Patient was able to perform sit to stand with minimal assistance and took 5 small lateral steps with minimal assist. Her transfers were limited by pain mainly in her shoulders and hips which her nurse was made aware of. Patient is alert and oriented x 1 to self, and was unable to consistently answer questions and follow commands. Patient would benefit from acute care PT to maximize strength and functional mobility and is appropriate for 5x/week low intensity physical therapy, as patient is currently functioning below baseline for mobility. Patient is a moderate complexity evaluation.      Today's Interventions: Patient educated on role of PT, PT POC, PT goals, progression of mobility. Sit to/from supine with Mod A x 2 for trunk and LE management, sit to/from stand x 1 with Min A x 2 for UE support, standing x 1 min with 5 small lateral steps. Patient requires increased time/effort to perform tasks throughout session, often requiring verbal and tactile cues for mobility.                            PLAN  Planned Frequency of Treatment:?? 1-2x per day for: 3-4x week       Planned Interventions: Balance activities, Education - Patient, Education - Family / caregiver, Functional mobility, Gait training, Home exercise program, Endurance activities, Teacher, early years/pre, Therapeutic activity, Therapeutic exercise, Self-care / Home training     Post-Discharge Physical Therapy Recommendations:?? 5x weekly, Low intensity     PT DME Recommendations: Defer to post acute??????????       Goals:   Patient and Family Goals: reduce pain level     Long Term Goal #1: In 6 weeks patient will be able to ambulate 150 ft modified independently with LRAD to be able to mobilize in the home        SHORT GOAL #1: Patient will perform supine to/from sit with SBA x 1  ?????????????????????? Time Frame : 2 weeks  SHORT GOAL #2: Patient will be able to ambulate 50 ft with SBA using LRAD  ?????????????????????? Time Frame : 2 weeks  SHORT GOAL #3: Patient will be able to transfer sit to/from stand with SBA using LRAD for x 5 reps with no need for rest  ?????????????????????? Time Frame : 2 weeks     ??????????????????????       ??????????????????????       Prognosis:?? Fair  Positive Indicators: PLOF  Barriers to Discharge: Cognitive deficits, Decreased caregiver support, Endurance deficits, Functional strength deficits, Impaired Balance, Inability to safely perform ADLS, Severity of deficits     SUBJECTIVE     Patient reports: Patient is agreeable to  physical therapy  Current Functional Status: Patient received session supine, ended sidelying with cushion underneath sacrum, AO x 1, needs within reach, bed alarm on, RN made aware     Prior Functional Status: Patient is a questionable historian. Patient reports she was ambulating in the home and husband was assisting with ADL's. Denies any falls. Pt reports husband provides transportation.  Equipment available at home: None (needs clarification for home DME)      Past Medical History:   Diagnosis Date   ??? CLL (chronic lymphocytic leukemia) (CMS-HCC)     relapsed   ??? CVA (cerebral vascular accident) (CMS-HCC)    ??? Diabetes (CMS-HCC)    ??? ESRD (end stage renal disease) on dialysis (CMS-HCC)    ??? HTN (hypertension)    ??? Palliative care patient          @  Past Surgical History:   Procedure Laterality Date   ??? PR SURG DIAGNOSTIC EXAM, ANORECTAL N/A 01/30/2021    Procedure: ANORECTAL EUA with Biopsy;  Surgeon: Estelle June, MD;  [facility]: [facility];  Service: General Surgery          @     Allergies: Lisinopril                  Objective Findings  Precautions / Restrictions  Precautions: Falls precautions, Other precautions (bleeding precautions)  Weight Bearing Status: Non-applicable  Required Braces or Orthoses: Non-applicable     Communication Preference: Verbal          Pain Comments: Patient reports she has pain everywhere mainly in her shoulders and hips but was unable to specify further due to cognitive status, RN made aware  Medical Tests / Procedures: Labs, orders, imaging reviewed in Epic  Equipment / Environment: Patient not wearing mask for full session, Vascular access (PIV, TLC, Port-a-cath, PICC)     At Rest: VSS per Epic     Orthostatics: Asymptomatic        Living Situation  Living Environment: House  Lives With: Spouse  Home Living: One level home, Walk-in shower, Level entry      Cognition  Cognition: Impaired/Limited  Cognition comment: Patient is AO x 1 to person, is able to follow commands with repeated verbal and tactile cues.  Orientation: Disoriented to place, Disoriented to time  Visual / Perception status  Visual/Perception: Within Functional Limits     Skin Inspection:  (Rectal lesion)     UE ROM / Strength  UE ROM/Strength: Right Impaired/Limited, Left Impaired/Limited  RUE Impairment: Limited AROM  LUE Impairment: Limited AROM  UE comment: Patient unable to flex bilateral shoulders > 90 degrees, limited by pain, RN made aware  LE ROM / Strength  LE ROM/Strength: Left WFL, Right WFL  LE comment: Patient able to sit to/from stand and stand for 1 min with Min A x 1     Motor/ Sensory/ Neuro  Sensation: [facility]  Sensation comment: Patient reports intact LT  Balance: Impaired  Balance comment: Bilateral UE support required sitting EOB and standing            Transfers  Transfers: Rolling Left, Sit to Stand, Supine to Sit  Rolling left assistance level: 25% or less physical assistance  Rolling Left comments: Min A x 1 for UE and LE positioning  Supine to Sit assistance level: 26% - 50% physical assistance  Supine to Sit comments: supine to/from sit with moderate assistance x 2 for LE and trunk management  Sit to Stand  assistance level: 25% or less physical assistance  Sit to Stand comments: Min A x 2, knee block for safety, UE support with stand      Gait  Level of Assistance: Minimal assist, patient does 75% or more  Assistive Device: Other (Comment) (bilateral hand-held assist)  Distance Ambulated (ft): 1 ft  Gait: Patient is able to take approx 5 small lateral side steps EOB with bilateral hand-held assist and min assist to facilitate weight shifting     Stairs: n/a, level entry home            Endurance: poor, limited by c/o fatigue and c/o pain     Physical Therapy Session Duration  PT Individual [mins]: 29     Medical Staff Made Aware: RN     I attest that I have reviewed the above information.  Signed: Fernanda Drum, PT  Filed 02/14/2021     The care for this patient was completed by Fernanda Drum, PT:  A student was present and participated in the care. Licensed/Credentialed therapist was physically present and immediately available to direct and supervise tasks that were related to patient management. The direction and supervision was continuous throughout the time these tasks were performed.    Fernanda Drum, PT

## 2021-02-14 NOTE — Unmapped (Signed)
Nephrology ESRD Consultation Note    Requesting attending physician: Helmut Muster, MD  Service requesting consult: MDE  Reason for consult: ESRD, provision of dialysis    Outpatient dialysis unit: [facility] [place]  Outpatient dialysis schedule: TTS    Assessment/Recommendations: Cristina Fields is a 70 y.o. 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      # ESRD:  3.5 hours.  3 K, 2.5 calcium, 137 sodium, 35 meq bicarbonate.  180 dialyzer.  3000 units heparin.     # Volume/ hypertension:  EDW 49.5 kg. BP 142/64, HR 70s.  No peripheral edema. On room air.    # Anemia:  Hgb 10.1 4/25 to 8.7 4/26.  Last received Mircera 60 mcg with treatments, last dose 01/17/21. Hgb of 6.9 increased to 10.6 (4/21) with 60 mcg of Mircera.  Mircera dose increased to 100 mcg but not yet given.  We will give Retacrit 1000 units with treatments and monitor Hgb results.     # Bone-mineral disease:  Calcium 8.6, phosphorus 2.6.  Received Hectoral 2 mcg with treatment.  We will give Zemplar 5 mcg as Hectoral is not on formulary.  PTH 322, 01/31/2021.  Received Drisdol 1.25 mg weekly which we will continue.     # Vascular access:  LUE AVG with good bruit and thrill.    # Hepatitis status:  Hepatitis b sAg negative, AB 46, both 12/29/20.     # Confusion:  Pt slow to respond to questions.  She was able to say her dialysis access is in the left arm.    # Additional recommendations:  -Avoid nephrotoxic drugs; dose all meds for creatinine clearance < 10 ml/min   -Unless absolutely necessary, no MRIs with gadolinium or dotorem.   -Implement save left arm precautions.  Prefer needle sticks in the dorsum of the hands or wrists.  No BP measurements in left arm.    *Please contact Nephrology PRIOR??to hospital discharge so we can assist the primary team and ensure appropriate dialysis related follow-up (ie scheduling, antibiotics, changes in EDW and dialysis access, etc.).*    ==================================================================    History of Present Illness: Cristina Fields is a 70 y.o. 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  She states she hurts all over.    Past Medical History:  Past Medical History:   Diagnosis Date   ??? CLL (chronic lymphocytic leukemia) (CMS-HCC)     relapsed   ??? CVA (cerebral vascular accident) (CMS-HCC)    ??? Diabetes (CMS-HCC)    ??? ESRD (end stage renal disease) on dialysis (CMS-HCC)    ??? HTN (hypertension)    ??? Palliative care patient          Past Surgical History:  Past Surgical History:   Procedure Laterality Date   ??? PR SURG DIAGNOSTIC EXAM, ANORECTAL N/A 01/30/2021    Procedure: ANORECTAL EUA with Biopsy;  Surgeon: Estelle June, MD;  Location: MAIN OR [facility];  Service: General Surgery       Allergies:  Lisinopril    Medications:   Current Facility-Administered Medications   Medication Dose Route Frequency Provider Last Rate Last Admin   ???  acetaminophen (TYLENOL) tablet 1,000 mg  1,000 mg Oral Q8H Napoleon Form, MD   1,000 mg at 02/14/21 0618   ??? atorvastatin (LIPITOR) tablet 40 mg  40 mg Oral Daily Napoleon Form, MD   40 mg at 02/14/21 0818   ??? carvediloL (COREG) tablet 12.5 mg  12.5 mg Oral BID Napoleon Form, MD   12.5 mg at 02/14/21 0818   ??? ciprofloxacin HCl (CIPRO) tablet 500 mg  500 mg Oral Q24H [facility] Napoleon Form, MD   500 mg at 02/13/21 2225   ??? epoetin alfa-EPBX (RETACRIT) injection 1,000 Units  1,000 Units Intravenous Each time in dialysis Phineas Douglas, ANP       ??? gentamicin-sodium citrate lock solution in NS  2.1 mL hemodialysis port injection Each time in dialysis PRN Phineas Douglas, ANP       ??? gentamicin-sodium citrate lock solution in NS  2.1 mL hemodialysis port injection Each time in dialysis PRN Phineas Douglas, ANP       ??? heparin (porcine) 1000 unit/mL injection 3,000 Units  3,000 Units Intravenous Each time in dialysis Phineas Douglas, ANP       ??? heparin (porcine) 5,000 unit/mL injection 5,000 Units  5,000 Units Subcutaneous Q12H [facility] Kerin Perna, MD       ??? hydrALAZINE (APRESOLINE) tablet 5 mg  5 mg Oral [facility] Napoleon Form, MD   5 mg at 02/14/21 0622   ??? lidocaine (LIDODERM) 5 % patch 2 patch  2 patch Transdermal Daily Napoleon Form, MD   2 patch at 02/14/21 0020   ??? metroNIDAZOLE (FLAGYL) tablet 500 mg  500 mg Oral TID Napoleon Form, MD   500 mg at 02/14/21 0818   ??? NIFEdipine (PROCARDIA XL) 24 hr tablet 60 mg  60 mg Oral Daily Napoleon Form, MD   60 mg at 02/14/21 0818   ??? oxyCODONE (ROXICODONE) immediate release tablet 2.5 mg  2.5 mg Oral Q4H PRN Napoleon Form, MD   2.5 mg at 02/14/21 0836    Or   ??? oxyCODONE (ROXICODONE) immediate release tablet 5 mg  5 mg Oral Q4H PRN Napoleon Form, MD   5 mg at 02/13/21 2225   ??? paricalcitoL (ZEMPLAR) injection 5 mcg  5 mcg Intravenous Each time in dialysis Phineas Douglas, ANP       ??? polyethylene glycol (MIRALAX) packet 17 g  17 g Oral BID Napoleon Form, MD   17 g at 02/14/21 0819   ??? sertraline (ZOLOFT) tablet 100 mg  100 mg Oral Daily Napoleon Form, MD   100 mg at 02/13/21 2214       Social History:  Tobacco use: never smoked  Alcohol use: denies  Drug use: denies  Living situation: the patient lives with their family.    Family History:  The patient's family history includes Diabetes in her father and mother; Hyperlipidemia in her father and mother..    Review of Systems:  A 12 system review of systems was negative except as noted in HPI.    Physical Exam:  Blood pressure 172/65, pulse 74, temperature 36.4 ??C (97.5 ??F), temperature source Oral, resp. rate 18, weight 50.6 kg (111 lb 8.8 oz), SpO2 98 %.      General:   No acute distress, cooperative.     Eyes:   Pupils equal and round.  Extra  occular muscles intact, and sclera clear.   ENT:   Nares without drainage.     Neck:   deferred  Lymph Nodes:  deferred   Cardiovascular:  Pulse normal rate, regularity and rhythm. . LUE AVG with good bruit and thrill. RIJ CVC without tenderness.   Lungs:  Respirations even and non labored   Skin:    No rash/lesions/breakdown on exposed skin   Psychiatry:   Alert and oriented     Abdomen:   abdomen soft not distended   Genito Urinary:   deferred   Rectal:    deferred   Extremities:   No bilateral cyanosis, clubbing or edema.  No rash, lesions,  or petechiae.   Musculo Skeletal:   deferred   Neurological:  No focal deficits   Pt slow to respond to questions.  She was able to say her dialysis access is in the left arm.       Test Results  Data Review:    All lab results last 24 hours:    Recent Results (from the past 24 hour(s))   Type and Screen    Collection Time: 02/13/21  2:18 PM   Result Value Ref Range    Antibody Screen NEG     ABO Grouping B POS    CBC w/ Differential    Collection Time: 02/13/21  2:18 PM   Result Value Ref Range    WBC 13.6 (H) 3.6 - 11.2 10*9/L    RBC 3.66 (L) 3.95 - 5.13 10*12/L    HGB 10.1 (L) 11.3 - 14.9 g/dL    HCT 09.8 (L) 11.9 - 44.0 %    MCV 84.2 77.6 - 95.7 fL    MCH 27.5 25.9 - 32.4 pg    MCHC 32.7 32.0 - 36.0 g/dL    RDW 14.7 (H) 82.9 - 15.2 %    MPV 9.7 6.8 - 10.7 fL    Platelet 61 (L) 150 - 450 10*9/L    Neutrophils % 21.2 %    Lymphocytes % 74.0 %    Monocytes % 2.1 %    Eosinophils % 1.4 %    Basophils % 1.3 %    Absolute Neutrophils 2.9 1.8 - 7.8 10*9/L    Absolute Lymphocytes 10.1 (H) 1.1 - 3.6 10*9/L    Absolute Monocytes 0.3 0.3 - 0.8 10*9/L    Absolute Eosinophils 0.2 0.0 - 0.5 10*9/L    Absolute Basophils 0.2 (H) 0.0 - 0.1 10*9/L    Anisocytosis Slight (A) Not Present   Morphology Review    Collection Time: 02/13/21  2:18 PM   Result Value Ref Range    Smear Review Comments See Comment (A) Undefined    Burr Cells Present (A) Not Present    Acanthocytes Present (A) Not Present    Poikilocytosis Moderate (A) Not Present   Pathologist Smear Review    Collection Time: 02/13/21  2:18 PM   Result Value Ref Range    Pathologist Smear Interpretation Confirmed by Hemepath Specialist/Senior Tech Confirmed by Hemepath Fellow, Confirmed by Hemepath Specialist/Senior Tech, Confirmed by Core Specialist/Senior Tech, To Be Accessioned - Case Report to Follow   Comprehensive Metabolic Panel    Collection Time: 02/13/21  5:47 PM   Result Value Ref Range    Sodium 137 135 - 145 mmol/L    Potassium 4.0 3.4 - 4.8 mmol/L    Chloride 100 98 - 107 mmol/L    Anion Gap 13 5 - 14 mmol/L    CO2 24.0 20.0 - 31.0 mmol/L    BUN 33 (H) 9 - 23 mg/dL    Creatinine 5.62 (H) 0.60 -  0.80 mg/dL    BUN/Creatinine Ratio 4     EGFR CKD-EPI Non-African American, Female 4 (L) >=60 mL/min/1.43m2    EGFR CKD-EPI African American, Female 5 (L) >=60 mL/min/1.71m2    Glucose 127 70 - 179 mg/dL    Calcium 9.2 8.7 - 16.1 mg/dL    Albumin 3.1 (L) 3.4 - 5.0 g/dL    Total Protein 5.9 5.7 - 8.2 g/dL    Total Bilirubin 0.3 0.3 - 1.2 mg/dL    AST 25 <=09 U/L    ALT 8 (L) 10 - 49 U/L    Alkaline Phosphatase 85 46 - 116 U/L   Magnesium Level    Collection Time: 02/13/21  5:47 PM   Result Value Ref Range    Magnesium 2.1 1.6 - 2.6 mg/dL   Ferritin    Collection Time: 02/13/21  5:47 PM   Result Value Ref Range    Ferritin 2,051.1 (H) 7.3 - 270.7 ng/mL   hsTroponin I (single, no delta)    Collection Time: 02/13/21  5:47 PM   Result Value Ref Range    hsTroponin I 17 <=34 ng/L   ECG 12 Lead    Collection Time: 02/13/21  8:03 PM   Result Value Ref Range    EKG Systolic BP  mmHg    EKG Diastolic BP  mmHg    EKG Ventricular Rate 80 BPM    EKG Atrial Rate 80 BPM    EKG P-R Interval 144 ms    EKG QRS Duration 76 ms    EKG Q-T Interval 398 ms    EKG QTC Calculation 459 ms    EKG Calculated P Axis 50 degrees    EKG Calculated R Axis -15 degrees    EKG Calculated T Axis -26 degrees    QTC Fredericia 438 ms   Iron Panel    Collection Time: 02/13/21 10:07 PM   Result Value Ref Range    Iron 35 (L) 50 - 170 ug/dL    TIBC 604 (L) 540 - 981 ug/dL    Iron Saturation (%) 21 %   Reticulocytes    Collection Time: 02/13/21 10:07 PM   Result Value Ref Range    Reticulocyte Auto % 2.02 0.50 - 2.17 %    Absolute Auto Reticulocyte 71.1 23.0 - 100.0 10*9/L   SMEAR - MD REQUEST    Collection Time: 02/13/21 10:07 PM   Result Value Ref Range    Smear - MD Request Slide Prepared for MD Review    Lactate dehydrogenase    Collection Time: 02/13/21 10:07 PM   Result Value Ref Range    LDH 872 (H) 120 - 246 U/L   Haptoglobin    Collection Time: 02/13/21 10:07 PM   Result Value Ref Range    Haptoglobin 186 40 - 280 mg/dL   TSH    Collection Time: 02/13/21 10:07 PM   Result Value Ref Range    TSH 1.395 0.550 - 4.780 uIU/mL   Vitamin B12 Level    Collection Time: 02/13/21 10:07 PM   Result Value Ref Range    Vitamin B-12 >2,000 (H) 211 - 911 pg/ml   hsTroponin I (single, no delta)    Collection Time: 02/13/21 10:07 PM   Result Value Ref Range    hsTroponin I 16 <=34 ng/L   Type and Screen    Collection Time: 02/13/21 10:07 PM   Result Value Ref Range    Reject/Recollect REJECT    Basic Metabolic Panel  Collection Time: 02/14/21  5:23 AM   Result Value Ref Range    Sodium 136 135 - 145 mmol/L    Potassium 3.8 3.4 - 4.8 mmol/L    Chloride 101 98 - 107 mmol/L    CO2 24.0 20.0 - 31.0 mmol/L    Anion Gap 11 5 - 14 mmol/L    BUN 41 (H) 9 - 23 mg/dL    Creatinine 1.61 (H) 0.60 - 0.80 mg/dL    BUN/Creatinine Ratio 5     EGFR CKD-EPI Non-African American, Female 4 (L) >=60 mL/min/1.80m2    EGFR CKD-EPI African American, Female 5 (L) >=60 mL/min/1.43m2    Glucose 110 70 - 179 mg/dL    Calcium 8.6 (L) 8.7 - 10.4 mg/dL   Hepatic Function Panel    Collection Time: 02/14/21  5:23 AM   Result Value Ref Range    Albumin 2.6 (L) 3.4 - 5.0 g/dL    Total Protein 5.0 (L) 5.7 - 8.2 g/dL    Total Bilirubin 0.2 (L) 0.3 - 1.2 mg/dL    Bilirubin, Direct 0.96 0.00 - 0.30 mg/dL    AST 21 <=04 U/L    ALT <7 (L) 10 - 49 U/L    Alkaline Phosphatase 74 46 - 116 U/L   Magnesium Level    Collection Time: 02/14/21  5:23 AM   Result Value Ref Range    Magnesium 1.8 1.6 - 2.6 mg/dL   Phosphorus Level    Collection Time: 02/14/21  5:23 AM   Result Value Ref Range    Phosphorus 4.3 2.4 - 5.1 mg/dL   CBC w/ Differential    Collection Time: 02/14/21  5:23 AM   Result Value Ref Range    WBC 13.7 (H) 3.6 - 11.2 10*9/L    RBC 3.20 (L) 3.95 - 5.13 10*12/L    HGB 8.7 (L) 11.3 - 14.9 g/dL    HCT 54.0 (L) 98.1 - 44.0 %    MCV 83.2 77.6 - 95.7 fL    MCH 27.1 25.9 - 32.4 pg    MCHC 32.6 32.0 - 36.0 g/dL    RDW 19.1 (H) 47.8 - 15.2 %    MPV 9.9 6.8 - 10.7 fL    Platelet 54 (L) 150 - 450 10*9/L    Anisocytosis Slight (A) Not Present     ECG: 02/13/2021  NORMAL SINUS RHYTHM  POSSIBLE LEFT ATRIAL ENLARGEMENT   LEFT VENTRICULAR HYPERTROPHY  ANTERIOR INFARCT  (CITED ON OR BEFORE 13-Feb-2021)  ABNORMAL ECG  WHEN COMPARED WITH ECG OF 26-Jan-2021 17:00,  NO SIGNIFICANT CHANGE WAS FOUND    Imaging: CXR 02/13/2021  FINDINGS:   Right IJ central catheter terminates over the right atrium.  The lungs are mildly hypoinflated. Left basilar atelectasis.  Small left pleural effusion. No pneumothorax.  Normal cardiomediastinal silhouette.   IMPRESSION:  Small left pleural effusion with likely adjacent atelectasis.  CT head 02/14/2021 FINDINGS:   There is a hyperdense focus in the right posterior cerebellar lobe measuring 1.5 x 1.1 x 1.2 cm (7:27, 6:38) with surrounding encephalomalacia.  There is no midline shift. No evidence of herniation. Density within the left basal ganglia, likely basal ganglia calcification. Diffuse cerebral atrophy with compensatory ventricular enlargement.  There is no evidence of acute infarct. No acute intracranial hemorrhage. No fractures are evident. The sinuses are pneumatized.   ??  IMPRESSION:  Right posterior cerebellar lobe hyperdensity with surrounding encephalomalacia. Findings were present on PET/CT dated 09/14/2020 and are favored to represent chronic postischemic changes  or post-treatment change rather than acute intraparenchymal hemorrhage.  ??  Current Medications    Current Facility-Administered Medications:   ???  acetaminophen (TYLENOL) tablet 1,000 mg, 1,000 mg, Oral, Q8H, Napoleon Form, MD, 1,000 mg at 02/14/21 0618  ???  atorvastatin (LIPITOR) tablet 40 mg, 40 mg, Oral, Daily, Napoleon Form, MD, 40 mg at 02/14/21 0818  ???  carvediloL (COREG) tablet 12.5 mg, 12.5 mg, Oral, BID, Napoleon Form, MD, 12.5 mg at 02/14/21 0818  ???  ciprofloxacin HCl (CIPRO) tablet 500 mg, 500 mg, Oral, Q24H [facility], Napoleon Form, MD, 500 mg at 02/13/21 2225  ???  epoetin alfa-EPBX (RETACRIT) injection 1,000 Units, 1,000 Units, Intravenous, Each time in dialysis, Phineas Douglas, ANP  ???  gentamicin-sodium citrate lock solution in NS, 2.1 mL, hemodialysis port injection, Each time in dialysis PRN, Phineas Douglas, ANP  ???  gentamicin-sodium citrate lock solution in NS, 2.1 mL, hemodialysis port injection, Each time in dialysis PRN, Phineas Douglas, ANP  ???  heparin (porcine) 1000 unit/mL injection 3,000 Units, 3,000 Units, Intravenous, Each time in dialysis, Phineas Douglas, ANP  ???  heparin (porcine) 5,000 unit/mL injection 5,000 Units, 5,000 Units, Subcutaneous, Q8H [facility], Napoleon Form, MD, 5,000 Units at 02/14/21 0617  ???  hydrALAZINE (APRESOLINE) tablet 5 mg, 5 mg, Oral, Q8H [facility], Napoleon Form, MD, 5 mg at 02/14/21 0622  ???  lidocaine (LIDODERM) 5 % patch 2 patch, 2 patch, Transdermal, Daily, Napoleon Form, MD, 2 patch at 02/14/21 0020  ???  metroNIDAZOLE (FLAGYL) tablet 500 mg, 500 mg, Oral, TID, Napoleon Form, MD, 500 mg at 02/14/21 0818  ???  NIFEdipine (PROCARDIA XL) 24 hr tablet 60 mg, 60 mg, Oral, Daily, Napoleon Form, MD, 60 mg at 02/14/21 0818  ???  oxyCODONE (ROXICODONE) immediate release tablet 2.5 mg, 2.5 mg, Oral, Q4H PRN, 2.5 mg at 02/14/21 0836 **OR** oxyCODONE (ROXICODONE) immediate release tablet 5 mg, 5 mg, Oral, Q4H PRN, Napoleon Form, MD, 5 mg at 02/13/21 2225  ???  paricalcitoL (ZEMPLAR) injection 5 mcg, 5 mcg, Intravenous, Each time in dialysis, Phineas Douglas, ANP  ???  polyethylene glycol (MIRALAX) packet 17 g, 17 g, Oral, BID, Napoleon Form, MD, 17 g at 02/14/21 0819  ???  sertraline (ZOLOFT) tablet 100 mg, 100 mg, Oral, Daily, Napoleon Form, MD, 100 mg at 02/13/21 2214      Time spent on counseling/coordination of care: 40 Minutes  Total time spent with patient: 15 Minutes

## 2021-02-14 NOTE — Unmapped (Signed)
[facility] Nephrology Hemodialysis Procedure Note     02/14/2021    Cristina Fields was seen and examined on hemodialysis    CHIEF COMPLAINT: End Stage Renal Disease    INTERVAL HISTORY: Oriented to year and place.  +Pain in my body.     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      DIALYSIS TREATMENT DATA:  Estimated Dry Weight (kg): 49.5 kg (109 lb 2 oz)     Dialyzer: F-180 (98 mLs)  Dialysis Bath  Bath: 3 K+ / 2.5 Ca+  Dialysate Na (mEq/L): 137 mEq/L  Dialysate HCO3 (mEq/L): 35 mEq/L  Blood Flow Rate (mL/min): 350 mL/min  Dialysis Flow (mL/min): 800 mL/min    PHYSICAL EXAM:  Vitals:  Temp:  [36.4 ??C (97.5 ??F)-37.1 ??C (98.8 ??F)] 36.7 ??C (98 ??F)  Heart Rate:  [72-89] 78  BP: (158-190)/(56-78) 162/73  MAP (mmHg):  [74-103] 90  Weights:  Pre-Treatment Weight (kg):  (UTW)    General: Appearing in no acute distress  Pulmonary: clear to auscultation  Cardiovascular: regular rate and rhythm  Extremities: no significant  edema  Access: LUE AV graft    LAB DATA:  Lab Results   Component Value Date    NA 136 02/14/2021    K 3.8 02/14/2021    CL 101 02/14/2021    CO2 24.0 02/14/2021    BUN 41 (H) 02/14/2021    CREATININE 8.85 (H) 02/14/2021      Lab Results   Component Value Date    HCT 26.7 (L) 02/14/2021    WBC 13.7 (H) 02/14/2021        ASSESSMENT/PLAN:  End Stage Renal Disease on Intermittent Hemodialysis:  - UF goal: 1L as tolerated  - Adjust medications for a GFR <10  - Avoid nephrotoxic agents     Bone Mineral Metabolism:  Lab Results   Component Value Date    CALCIUM 8.6 (L) 02/14/2021    CALCIUM 9.2 02/13/2021    Lab Results   Component Value Date    ALBUMIN 2.6 (L) 02/14/2021    ALBUMIN 3.1 (L) 02/13/2021      Lab Results   Component Value Date    PHOS 4.3 02/14/2021    PHOS 5.5 (H) 01/30/2021    No results found for: PTH   - Continue phosphorus binder and dietary counseling.    Anemia:   Lab Results   Component Value Date    HGB 8.7 (L) 02/14/2021    HGB 10.1 (L) 02/13/2021    HGB 7.4 (L) 02/03/2021    Iron Saturation (%)   Date Value Ref Range Status   02/13/2021 21 % Final      Lab Results   Component Value Date    FERRITIN 2,051.1 (H) 02/13/2021       Continue intravenous Epogen/Retacrit 1K units with each treatment.   Hgb 10.1 4/25 to 8.7 4/26.  Last received Mircera 60 mcg with treatments, last dose 01/17/21. Hgb of 6.9 increased to 10.6 (4/21) with 60 mcg of Mircera.  Mircera dose increased to 100 mcg but not yet given.  We will give Retacrit 1000 units with treatments and monitor Hgb results.     Tonye Royalty, MD  [place] [facility] Hypertension

## 2021-02-14 NOTE — Unmapped (Signed)
Hematology Resident (MEDE) Progress Note    Assessment & Plan:   Cristina Fields is a 70 y.o. female with CLL, ESRD on iHD, hypertension, prior cerebellar infarct that presented to [facility] as a direct admission from infusion clinic with disorientation and pain.     Active Problems:    * No active hospital problems. *  Resolved Problems:    * No resolved hospital problems. *    Grade 3 Encephalopathy: Etiology unclear, likely multifactorial due to delirium super-imposed on underlying cognitive impairment (SLUMS outpatient 15), toxic-metabolic (ESRD on iHD), medication induced (oxycodone use at home for pain, etc), infectious (possible infection of condyloma EUA/biopsy site, CNS infection -although seems less likely due to no localizing neuro signs/no meningeal signs), stroke. CT head with evidence of prior cerebellar infarct. TSH normal, Vitamin B12>2000.   -f/u Blood cultures  -low-threshold for LP if encephalopathy continues or worsens. (of note Zanabrutinib causes platelet inhibition and needs to be held given risk of bleeding).   -MRI brain wo contrast ordered.  -f/u UA and Urine Cx if makes enough urine.     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 MRI pelvis with contrast was ordered outpatient during last admission as recommended by colorectal surgery. However, there is risk for NSF with gadolinium. Examined with RN. Wound is raw and she is having significant pain from it, however drainage is clear and non-purulent. Given leukocytosis and altered mental status, will treat with empiric antibiotics.   - Follow-up blood cultures.   - Continue Ciprofloxacin + metronidazole for 5 days  In case wound infection - per SRG low concern for infection. Likely de-escalate tomorrow (4/25-)   - Colorectal Surgery consulted - no indication for MRI imaging at this time.  - Wound Care consulted  - Pain control with scheduled tylenol, PRN oxycodone 2.5-5mg  q4h.     #CLL - Worsening Leukocytosis with lymphocyte predominance:  Oncology history:  Follows with Dr. Lonni Fix. Diagnosed ~2012 and received fludarabine + cyclophosphamide + rituximab x6. Relapsed in 05/2014 treated with bendamustine + rituximab. Relapsed again in 2018 and started ibrutinib in 5/201. Rapid relapse in 04/2020 and was treated with rituximab and chlorambucil through November, 2021. On zanubrutinib prior to admission.   - HOLD zanubrutinib given possible need for LP and plt inhibition effects  - BMBx planned outpatient. Consider performing this while she is hospitalized.     #Anemia - Thrombocytopenia   Of  Note, she received a blood transfusion on 4/15 and hgb has been stable at 10.1 on admission up from 7.4. May be related to zanubrutinib vs worsening CLL.   - Retic count 2.02; Iron 35, Iron Sat 21%, Ferritin 2,051; Vitamin B12 >2000; Folate pending  -Trend CBC daily  - Transfuse for hgb <7, platelets <10,000, higher if actively bleeding    ESRD on iHD - HTN:   -iHD while inpatient  -Appreciate Nephrology assistance  -Home Coreg 12.5mg  BID, Nifedipine XL 60mg  daily; Hydralazine 10mg  q8h; Losartan 100mg  daily.  -Hold home Lasix 80mg  daily    Chronic Problems:  History of CVA  Hospitalized for acute cerebellar infarct in 03/2020.   - Continue atorvastatin   - Not on aspirin while on zanubrutinib for thrombocytopenia   ??  T2DM  No home medications. Hgb A1c 5.8 in 12/2020.   - Trend glucose on BMP  ??  MDD  - Continue sertraline 100 mg daily     Daily Checklist:  Diet: Regular Diet  DVT PPx: Heparin 5000 BID  Electrolytes:  Replete Potassium to >/= 3.6 and Magnesium to >/= 1.8  Code Status: Full Code  Dispo: Admit to 4 ONC    Team Contact Information:   Primary Team: Hematology Resident (MEDE)  Primary Resident: Coralee Pesa, MD  Resident's Pager: [phone] (Hematology Intern - [place]fton Asters)    Interval History:   Admitted overnight. Continues to be confused this morning. Able to state her name and that year was 2022.     Patient cannot provide ROS due to AMS.    Objective:   Temp:  [36.4 ??C-37.1 ??C] 36.7 ??C  Heart Rate:  [72-89] 76  Resp:  [16-18] 18  BP: (150-190)/(56-78) 160/67  SpO2:  [94 %-100 %] 98 %    Gen: Older woman, lying in bed, awakens when asked questions but falls back to sleep otherwise. In NAD.   Eyes: sclera anicteric, EOMI  Neck: JVP elevated to jaw line lying at 30 degrees.   Heart: RRR, normal S1/S2  Lungs: Normal WOB on RA  Abdomen: Normoactive bowel sounds, soft, NTND, no rebound/guarding  Extremities: no clubbing, cyanosis, or edema in the BLEs  Neuro: Somnolent but arouses to voice and answers questions; Oriented to self and year, but not to location or circumstance. Waxing and waning attention. Moves extremities equally.  Skin: See media tab for image of condyloma and wound from today exam.    Labs/Studies: Labs and Studies from the last 24hrs per EMR and Reviewed

## 2021-02-14 NOTE — Unmapped (Signed)
Pt arrived from infusion clinic. Pt oriented to self and sometimes situation, otherwise disoriented to place/time. Pt has had significant rectal pain d/t lesion. Pt has had relief from prn Oxy as well as scheduled Tylenol. Pt also uses a pillow for comfort around her sacral area. Pt has had elevated BP up to 179/71, MD notified. 5 mg Hydralazine ordered per MD and given, awaiting results. All other VSS. Falls precautions in place, including bed alarm. Will continue to monitor pt status.

## 2021-02-14 NOTE — Unmapped (Signed)
RN report to [place].  Pt will be transported to [id number] via stretcher.

## 2021-02-15 ENCOUNTER — Telehealth: Payer: Self-pay | Admitting: Hospice

## 2021-02-15 DIAGNOSIS — A5131 Condyloma latum: Secondary | ICD-10-CM | POA: Diagnosis not present

## 2021-02-15 DIAGNOSIS — Z0389 Encounter for observation for other suspected diseases and conditions ruled out: Secondary | ICD-10-CM | POA: Diagnosis not present

## 2021-02-15 DIAGNOSIS — M19012 Primary osteoarthritis, left shoulder: Secondary | ICD-10-CM | POA: Diagnosis not present

## 2021-02-15 DIAGNOSIS — D696 Thrombocytopenia, unspecified: Secondary | ICD-10-CM | POA: Diagnosis not present

## 2021-02-15 DIAGNOSIS — C9112 Chronic lymphocytic leukemia of B-cell type in relapse: Secondary | ICD-10-CM | POA: Diagnosis not present

## 2021-02-15 DIAGNOSIS — D649 Anemia, unspecified: Secondary | ICD-10-CM | POA: Diagnosis not present

## 2021-02-15 LAB — BASIC METABOLIC PANEL
ANION GAP: 10 mmol/L (ref 5–14)
BLOOD UREA NITROGEN: 16 mg/dL (ref 9–23)
BUN / CREAT RATIO: 4
CALCIUM: 8.5 mg/dL — ABNORMAL LOW (ref 8.7–10.4)
CHLORIDE: 98 mmol/L (ref 98–107)
CO2: 27 mmol/L (ref 20.0–31.0)
CREATININE: 4.23 mg/dL — ABNORMAL HIGH
EGFR CKD-EPI AA FEMALE: 12 mL/min/{1.73_m2} — ABNORMAL LOW (ref >=60)
EGFR CKD-EPI NON-AA FEMALE: 10 mL/min/{1.73_m2} — ABNORMAL LOW (ref >=60)
GLUCOSE RANDOM: 78 mg/dL (ref 70–179)
POTASSIUM: 3.6 mmol/L (ref 3.4–4.8)
SODIUM: 135 mmol/L (ref 135–145)

## 2021-02-15 LAB — CBC W/ AUTO DIFF
BASOPHILS ABSOLUTE COUNT: 0.1 10*9/L (ref 0.0–0.1)
BASOPHILS RELATIVE PERCENT: 0.7 %
EOSINOPHILS ABSOLUTE COUNT: 0.2 10*9/L (ref 0.0–0.5)
EOSINOPHILS RELATIVE PERCENT: 1.3 %
HEMATOCRIT: 26.6 % — ABNORMAL LOW (ref 34.0–44.0)
HEMOGLOBIN: 8.8 g/dL — ABNORMAL LOW (ref 11.3–14.9)
LYMPHOCYTES ABSOLUTE COUNT: 9 10*9/L — ABNORMAL HIGH (ref 1.1–3.6)
LYMPHOCYTES RELATIVE PERCENT: 77.5 %
MEAN CORPUSCULAR HEMOGLOBIN CONC: 33.1 g/dL (ref 32.0–36.0)
MEAN CORPUSCULAR HEMOGLOBIN: 27.3 pg (ref 25.9–32.4)
MEAN CORPUSCULAR VOLUME: 82.6 fL (ref 77.6–95.7)
MEAN PLATELET VOLUME: 9.6 fL (ref 6.8–10.7)
MONOCYTES ABSOLUTE COUNT: 0.2 10*9/L — ABNORMAL LOW (ref 0.3–0.8)
MONOCYTES RELATIVE PERCENT: 2.1 %
NEUTROPHILS ABSOLUTE COUNT: 2.1 10*9/L (ref 1.8–7.8)
NEUTROPHILS RELATIVE PERCENT: 18.4 %
PLATELET COUNT: 49 10*9/L — ABNORMAL LOW (ref 150–450)
RED BLOOD CELL COUNT: 3.22 10*12/L — ABNORMAL LOW (ref 3.95–5.13)
RED CELL DISTRIBUTION WIDTH: 16.6 % — ABNORMAL HIGH (ref 12.2–15.2)
WBC ADJUSTED: 11.6 10*9/L — ABNORMAL HIGH (ref 3.6–11.2)

## 2021-02-15 LAB — SLIDE REVIEW

## 2021-02-15 LAB — APTT
APTT: 33 s (ref 24.9–36.9)
HEPARIN CORRELATION: 0.2

## 2021-02-15 LAB — MAGNESIUM: MAGNESIUM: 1.6 mg/dL (ref 1.6–2.6)

## 2021-02-15 LAB — FIBRINOGEN: FIBRINOGEN LEVEL: 427 mg/dL — ABNORMAL HIGH (ref 177–386)

## 2021-02-15 LAB — PHOSPHORUS: PHOSPHORUS: 3 mg/dL (ref 2.4–5.1)

## 2021-02-15 LAB — PROTIME-INR
INR: 1.25
PROTIME: 14.6 s — ABNORMAL HIGH (ref 10.3–13.4)

## 2021-02-15 MED ADMIN — losartan (COZAAR) tablet 100 mg: 100 mg | ORAL | @ 12:00:00

## 2021-02-15 MED ADMIN — atorvastatin (LIPITOR) tablet 40 mg: 40 mg | ORAL | @ 12:00:00

## 2021-02-15 MED ADMIN — metroNIDAZOLE (FLAGYL) tablet 500 mg: 500 mg | ORAL | @ 12:00:00 | Stop: 2021-02-15

## 2021-02-15 MED ADMIN — carvediloL (COREG) tablet 12.5 mg: 12.5 mg | ORAL | @ 12:00:00 | Stop: 2021-02-15

## 2021-02-15 MED ADMIN — oxyCODONE (ROXICODONE) immediate release tablet 5 mg: 5 mg | ORAL | @ 11:00:00 | Stop: 2021-02-27

## 2021-02-15 MED ADMIN — sertraline (ZOLOFT) tablet 100 mg: 100 mg | ORAL | @ 03:00:00

## 2021-02-15 MED ADMIN — oxyCODONE (ROXICODONE) immediate release tablet 5 mg: 5 mg | ORAL | @ 22:00:00 | Stop: 2021-02-27

## 2021-02-15 MED ADMIN — oxyCODONE (ROXICODONE) immediate release tablet 2.5 mg: 2.5 mg | ORAL | @ 12:00:00 | Stop: 2021-02-15

## 2021-02-15 MED ADMIN — hydrALAZINE (APRESOLINE) tablet 10 mg: 10 mg | ORAL | @ 03:00:00

## 2021-02-15 MED ADMIN — carvediloL (COREG) tablet 12.5 mg: 12.5 mg | ORAL | @ 03:00:00

## 2021-02-15 MED ADMIN — metroNIDAZOLE (FLAGYL) tablet 500 mg: 500 mg | ORAL | @ 03:00:00 | Stop: 2021-02-18

## 2021-02-15 MED ADMIN — NIFEdipine (PROCARDIA XL) 24 hr tablet 60 mg: 60 mg | ORAL | @ 12:00:00

## 2021-02-15 MED ADMIN — acetaminophen (TYLENOL) tablet 1,000 mg: 1000 mg | ORAL | @ 18:00:00

## 2021-02-15 MED ADMIN — ciprofloxacin HCl (CIPRO) tablet 500 mg: 500 mg | ORAL | @ 03:00:00 | Stop: 2021-02-18

## 2021-02-15 MED ADMIN — diclofenac sodium (VOLTAREN) 1 % gel 2 g: 2 g | TOPICAL | @ 22:00:00

## 2021-02-15 MED ADMIN — hydrALAZINE (APRESOLINE) tablet 10 mg: 10 mg | ORAL | @ 11:00:00

## 2021-02-15 MED ADMIN — polyethylene glycol (MIRALAX) packet 17 g: 17 g | ORAL | @ 03:00:00

## 2021-02-15 MED ADMIN — hydrALAZINE (APRESOLINE) tablet 10 mg: 10 mg | ORAL | @ 18:00:00

## 2021-02-15 MED ADMIN — cefTRIAXone (ROCEPHIN) 2 g in sodium chloride 0.9 % (NS) 100 mL IVPB-connector bag: 2 g | INTRAVENOUS | @ 18:00:00 | Stop: 2021-02-22

## 2021-02-15 MED ADMIN — oxyCODONE (ROXICODONE) immediate release tablet 2.5 mg: 2.5 mg | ORAL | @ 09:00:00 | Stop: 2021-02-27

## 2021-02-15 MED ADMIN — oxyCODONE (ROXICODONE) immediate release tablet 5 mg: 5 mg | ORAL | @ 15:00:00 | Stop: 2021-02-27

## 2021-02-15 MED ADMIN — lidocaine (LIDODERM) 5 % patch 2 patch: 2 | TRANSDERMAL | @ 04:00:00

## 2021-02-15 MED ADMIN — heparin (porcine) 5,000 unit/mL injection 5,000 Units: 5000 [IU] | SUBCUTANEOUS | @ 03:00:00

## 2021-02-15 MED ADMIN — acetaminophen (TYLENOL) tablet 1,000 mg: 1000 mg | ORAL | @ 11:00:00

## 2021-02-15 MED ADMIN — polyethylene glycol (MIRALAX) packet 17 g: 17 g | ORAL | @ 14:00:00

## 2021-02-15 MED ADMIN — acetaminophen (TYLENOL) tablet 1,000 mg: 1000 mg | ORAL | @ 03:00:00

## 2021-02-15 NOTE — Unmapped (Signed)
Hematology Resident (MEDE) Progress Note    Assessment & Plan:   Cristina Fields is a 70 y.o. female with CLL, ESRD on iHD, hypertension, prior cerebellar infarct that presented to [facility] as a direct admission from infusion clinic with disorientation and pain. Found to have Strep Bovis bacteremia.     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 *    Strep Bovis Bacteremia (1/2 blood cx positive on 4/25): 1/2 Blood Cx positive for Strep Bovis sp. Given immunocompromised state, will treat as true bacteremia.   -CTX 2g daily (4/27- , anticipate 2 week course at least)  -f/u repeat blood cultures on 4/27; repeat culture q48h until clear  -TTE ordered  -Will plan to consult ICID in AM  -No records of prior colonoscopy, will likely need assessment for colon malignancy given association of Strep Bovis and GI malignancy.      Grade 3 Encephalopathy: Suspect ultimately multifactorial in the setting of bacteremia per above, toxic-metabolic/uremia, and medication/pain driven superimposed on cognitive impairment at baseline (SLUMS 15 as outpatient). CT head on admission with  Brain MRI on 4/26 initially read with concern for [facility] prompting NSGY consult who recommended LP. Subsequent over-read of MRI by attending however suggests against a SAH. MRA without aneurysm.   -f/u Blood cultures  -Hold on performing LP for now given over-read of MRI. Can consider if her encephalopathy continues or worsens. (of note Zanabrutinib causes platelet inhibition and needs to be held given risk of bleeding).   -f/u UA and Urine Cx if makes enough urine.     #Left clavicle, shoulder, neck pain: May be musculoskeletal in nature, but does have possible point tenderness over the left clavicle. 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 MRI pelvis with contrast was ordered outpatient during last admission as recommended by colorectal surgery. However, there is risk for NSF with gadolinium. Examined with RN. Wound is raw and she is having significant pain from it, however drainage is clear and non-purulent. Given leukocytosis and altered mental status, will treat with empiric antibiotics.   - Follow-up blood cultures.   - Stop Ciprofloxacin + metronidazole given low concern for wound infection. (4/25-4/27)   - Colorectal Surgery consulted - no indication for MRI imaging at this time.  - Wound Care consulted  - Pain control with scheduled tylenol, PRN oxycodone 2.5-5mg  q4h.     #CLL - Worsening Leukocytosis with lymphocyte predominance:  Oncology history:  Follows with Dr. Lonni Fix. Diagnosed ~2012 and received fludarabine + cyclophosphamide + rituximab x6. Relapsed in 05/2014 treated with bendamustine + rituximab. Relapsed again in 2018 and started ibrutinib in 5/201. Rapid relapse in 04/2020 and was treated with rituximab and chlorambucil through November, 2021. On zanubrutinib prior to admission.   - HOLD zanubrutinib given possible need for LP and plt inhibition effects  - BMBx planned outpatient. Consider performing this while she is hospitalized.     #Anemia - Thrombocytopenia   Of  Note, she received a blood transfusion on 4/15 and hgb has been stable at 10.1 on admission up from 7.4. May be related to  zanubrutinib vs worsening CLL.   - Retic count 2.02; Iron 35, Iron Sat 21%, Ferritin 2,051; Vitamin B12 >2000; Folate wnl.  -Trend CBC daily  - Transfuse for hgb <7, platelets <10,000, higher if actively bleeding    ESRD on iHD - HTN:   -iHD while inpatient  -Appreciate Nephrology assistance  -Increase Coreg to 25 mg BID  -Continue Nifedipine XL 60mg  daily; Hydralazine 10mg  q8h; Losartan 100mg  daily.  -Hold home Lasix 80mg  daily    Chronic Problems:  History of CVA  Hospitalized for acute cerebellar infarct in 03/2020.   - Continue atorvastatin   - Not on aspirin while on zanubrutinib for thrombocytopenia   ??  T2DM  No home medications. Hgb A1c 5.8 in 12/2020.   - Trend glucose on BMP  ??  MDD  - Continue sertraline 100 mg daily     Daily Checklist:  Diet: Regular Diet  DVT PPx: SCDs, holding heparin given thrombocytopenia and bleeding risk.   Electrolytes: Replete Potassium to >/= 3.6 and Magnesium to >/= 1.8  Code Status: Full Code  Dispo: Admit to 4 ONC    Team Contact Information:   Primary Team: Hematology Resident (MEDE)  Primary Resident: Coralee Pesa, MD  Resident's Pager: [phone] (Hematology Intern - [place] Asters)    Interval History:   Reports shoulder pain and pain over her rectum. Alert and oriented to person and year, but not able to state where she is or why she is in the hospital.     Concern for possible SAH noted on MRI brain overnight. NSGY consulted who rec'd LP but not able to perform overnight. MRI over read this morning suggesting against acute SAH, and MRA without aneurysm. Mental status improving from yesterday with no focal neurologic deficits so will hold on LP for now.     1/2 blood cx positive for Strep Bovis. Plan to treat for GPC bacteremia given immunocompromised state and encephalopathy.     ROS otherwise negative.     Objective:   Temp:  [36.5 ??C-37.3 ??C] 36.6 ??C  Heart Rate:  [68-83] 68  Resp:  [16-20] 16  BP: (134-173)/(53-78) 173/53  SpO2:  [93 %-97 %] 95 %    Gen: Older woman, lying in bed, more alert than yesterday's exam, alert to person and year but not place or circumstance.   Eyes: sclera anicteric, EOMI  Neck: Tenderness to palpation over left proximal clavicle.   Heart: RRR, normal S1/S2  Lungs: Normal WOB on RA  Abdomen: Normoactive bowel sounds, soft, NTND, no rebound/guarding  Extremities: no clubbing, cyanosis, or edema in the BLEs  Neuro: Answers questions, word finding difficulties and echolalia at times. Oriented to self and year, but not to location or circumstance. Waxing and waning attention. States she is confused. CN II-XII intact bilaterally, moves extremities equally.  Skin: Condyloma not examined today.     Labs/Studies: Labs and Studies from the last 24hrs per EMR and Reviewed

## 2021-02-15 NOTE — Unmapped (Signed)
HEMODIALYSIS NURSE PROCEDURE NOTE       Treatment Number:  1 Room / Station:  5    Procedure Date:  02/14/21 Device Name/Number: Beverely Pace    Total Dialysis Treatment Time:  213 Min.    CONSENT:    Written consent was obtained prior to the procedure and is detailed in the medical record.  Prior to the start of the procedure, a time out was taken and the identity of the patient was confirmed via name, medical record number and date of birth.     WEIGHT:  Hemodialysis Pre-Treatment Weights     Date/Time Pre-Treatment Weight (kg) Estimated Dry Weight (kg) Patient Goal Weight (kg) Total Goal Weight (kg)    02/14/21 1357 ???  UTW 49.5 kg (109 lb 2 oz) 1 kg (2 lb 3.3 oz) 1.55 kg (3 lb 6.7 oz)         Hemodialysis Post Treatment Weights     Date/Time Post-Treatment Weight (kg) Treatment Weight Change (kg)    02/14/21 1815 ???  UTW ???        Active Dialysis Orders (168h ago, onward)     Start     Ordered    02/16/21 0700  Hemodialysis inpatient  Every Tue,Thu,Sat      Comments: RIJ IS NOT A DIALYSIS ACCESS   Question Answer Comment   K+ 3 meq/L    Ca++ 2.5 meq/L    Bicarb 35 meq/L    Na+ 137 meq/L    Na+ Modeling no    Dialyzer [id number]    Dialysate Temperature (C) 37    BFR-As tolerated to a maximum of: 500 mL/min    DFR 800 mL/min    Duration of treatment 3.5 Hr    Dry weight (kg) 49.5 kg    Challenge dry weight (kg) no    Fluid removal (L) to edw    Tubing Adult = 142 ml    Access Site AVG    Access Site Location Left    Keep SBP >: 95        02/14/21 1040              ASSESSMENT:  General appearance: alert and cooperative  Neurologic: awake, alert x 1-2  Lungs: diminished breath sounds bibasilar and bilaterally  Heart: regular rate and rhythm, S1, S2 normal, no murmur, click, rub or gallop  Abdomen: soft, non-tender; bowel sounds normal; no masses,  no organomegaly      ACCESS SITE:       Hemodialysis Catheter  Right Subclavian (Active)   Site Assessment Clean;Dry;Intact 02/14/21 0822   Proximal Lumen Status / Patency Capped 02/14/21 0822   Proximal Lumen Intervention Clamped 02/14/21 0822   Medial Lumen Status / Patency Capped 02/14/21 0822   Medial Lumen Intervention Clamped 02/14/21 0822   Dressing Intervention Dressing changed 02/14/21 0700   Dressing Status      Clean;Dry;Intact/not removed 02/14/21 1610   Site Condition No complications 02/14/21 9604   Dressing Type CHG gel;Occlusive;Transparent 02/14/21 5409        Arteriovenous Fistula - Vein Graft  Access Arteriovenous vein graft Left;Upper Arm (Active)   Site Assessment Dry;Intact;Tender 02/14/21 1815   AV Fistula Thrill Present;Bruit Present 02/14/21 1815   Status Deaccessed 02/14/21 1815   Dressing Intervention New dressing 02/14/21 1815   Dressing Status      Clean;Dry;Intact/not removed 02/14/21 1815   Site Condition No complications 01/30/21 0925   Dressing Gauze 02/14/21 1815   Dressing To Be Removed (  Date/Time) 4-6 hours post HD 02/14/21 1815          Patient Lines/Drains/Airways Status     Active Peripheral & Central Intravenous Access     Name Placement date Placement time Site Days    Peripheral IV 02/13/21 Posterior;Right Hand 02/13/21  1420  Hand  1    Peripheral IV 02/13/21 Right Antecubital 02/13/21  1743  Antecubital  1               LAB RESULTS:  Lab Results   Component Value Date    NA 136 02/14/2021    K 3.8 02/14/2021    CL 101 02/14/2021    CO2 24.0 02/14/2021    BUN 41 (H) 02/14/2021    CREATININE 8.85 (H) 02/14/2021    GLU 110 02/14/2021    CALCIUM 8.6 (L) 02/14/2021    PHOS 4.3 02/14/2021    MG 1.8 02/14/2021    IRON 35 (L) 02/13/2021    LABIRON 21 02/13/2021    FERRITIN 2,051.1 (H) 02/13/2021    TIBC 163 (L) 02/13/2021     Lab Results   Component Value Date    WBC 13.7 (H) 02/14/2021    HGB 8.7 (L) 02/14/2021    HCT 26.7 (L) 02/14/2021    PLT 54 (L) 02/14/2021        VITAL SIGNS:   Temperature     Date/Time Temp Temp src       02/14/21 1815 36.8 ??C (98.2 ??F) Oral         Hemodynamics     Date/Time Pulse BP MAP (mmHg) Patient Position    02/14/21 1815 82 146/68 ??? Lying    02/14/21 1755 78 151/69 ??? Lying    02/14/21 1730 75 147/55 ??? ???    02/14/21 1700 79 134/64 ??? Lying    02/14/21 1630 76 134/54 ??? Lying    02/14/21 1600 73 150/54 ??? Lying    02/14/21 1530 76 160/67 ??? Lying    02/14/21 1500 73 150/70 ??? Lying        Blood Volume Monitor     Date/Time Blood Volume Change (%) HCT HGB Critline O2 SAT %    02/14/21 1755 -13.7 % 31.4 10.7 96.2    02/14/21 1730 -11.1 % 30.5 10.4 95    02/14/21 1700 -11.6 % 30.7 10.4 95    02/14/21 1630 -10.1 % 30.2 10.3 94    02/14/21 1600 -10 % 30.1 10.3 95.1    02/14/21 1530 -9.5 % 30 10.2 95.5    02/14/21 1500 -7.3 % 29.3 10 94.6    02/14/21 1445 -5.4 % 28.7 9.8 94.9        Oxygen Therapy     Date/Time Resp SpO2 O2 Device FiO2 (%) O2 Flow Rate (L/min)    02/14/21 1815 18 ??? None (Room air) -- --    02/14/21 1755 18 ??? ??? -- --    02/14/21 1730 18 ??? ??? -- --    02/14/21 1700 18 ??? ??? -- --    02/14/21 1600 18 ??? ??? -- --    02/14/21 1530 18 ??? ??? -- --    02/14/21 1500 18 ??? ??? -- --          Pre-Hemodialysis Assessment     Date/Time Therapy Number Dialyzer Hemodialysis Line Type All Machine Alarms Passed    02/14/21 1357 1 F-180 (98 mLs) Adult (142 m/s) Yes    Date/Time Air Detector Saline Line Double Clampled Hemo-Safe Applied  Dialysis Flow (mL/min)    02/14/21 1357 Engaged ??? ??? 800 mL/min    Date/Time Verify Priming Solution Priming Volume Hemodialysis Independent pH Hemodialysis Machine Conductivity (mS/cm)    02/14/21 1357 0.9% NS 300 mL ??? 13.7 mS/cm    Date/Time Hemodialysis Independent Conductivity (mS/cm) Bicarb Conductivity Residual Bleach Negative Total Chlorine    02/14/21 1357 13.6 mS/cm -- Yes 0        Pre-Hemodialysis Treatment Comments     Date/Time Pre-Hemodialysis Comments    02/14/21 1357 awake, alert        Hemodialysis Treatment     Date/Time Blood Flow Rate (mL/min) Arterial Pressure (mmHg) Venous Pressure (mmHg) Transmembrane Pressure (mmHg)    02/14/21 1755 350 mL/min -109 mmHg 163 mmHg 41 mmHg    02/14/21 1730 350 mL/min -114 mmHg 156 mmHg 36 mmHg    02/14/21 1700 350 mL/min -108 mmHg 165 mmHg 33 mmHg    02/14/21 1630 350 mL/min -109 mmHg 165 mmHg 36 mmHg    02/14/21 1600 350 mL/min -113 mmHg 161 mmHg 37 mmHg    02/14/21 1530 350 mL/min -107 mmHg 162 mmHg 42 mmHg    02/14/21 1500 350 mL/min -101 mmHg 161 mmHg 35 mmHg    02/14/21 1445 350 mL/min -91 mmHg 166 mmHg 31 mmHg    02/14/21 1422 350 mL/min -70 mmHg 139 mmHg 32 mmHg    Date/Time Ultrafiltration Rate (mL/hr) Ultrafiltrate Removed (mL) Dialysate Flow Rate (mL/min) KECN (Kecn)    02/14/21 1755 620 mL/hr 1550 mL 800 ml/min ???    02/14/21 1730 320 mL/hr 1316 mL 800 ml/min ???    02/14/21 1700 470 mL/hr 1115 mL 800 ml/min ???    02/14/21 1630 480 mL/hr 885 mL 800 ml/min ???    02/14/21 1600 480 mL/hr 650 mL 800 ml/min ???    02/14/21 1530 480 mL/hr 407 mL 800 ml/min ???    02/14/21 1500 300 mL/hr 185 mL 800 ml/min ???    02/14/21 1445 300 mL/hr 113 mL 800 ml/min ???    02/14/21 1422 300 mL/hr 0 mL 800 ml/min ???        Hemodialysis Treatment Comments     Date/Time Intra-Hemodialysis Comments    02/14/21 1755 tx completed and tolerated well. rinseback all blood.    02/14/21 1730 vss tolerating tx well.    02/14/21 1700 vss.    02/14/21 1630 vss. resting    02/14/21 1600 vss. tolerating tx    02/14/21 1530 vss.    02/14/21 1500 vss. resting    02/14/21 1445 Dr Thad Ranger at bedside, no new order.    02/14/21 1422 tx started        Post Treatment     Date/Time Rinseback Volume (mL) On Line Clearance: spKt/V Total Liters Processed (L/min) Dialyzer Clearance    02/14/21 1815 300 mL 1.92 spKt/V 72 L/min Lightly streaked        Post Hemodialysis Treatment Comments     Date/Time Post-Hemodialysis Comments    02/14/21 1815 awake, NAD        Hemodialysis I/O     Date/Time Total Hemodialysis Replacement Volume (mL) Total Ultrafiltrate Output (mL)    02/14/21 1815 ??? 1000 mL          [id number] - Medicaitons Given During Treatment  (last 3 hrs)         ** No medications to display **            Patient tolerated treatment in a  Bed.

## 2021-02-15 NOTE — Unmapped (Signed)
Lower GI Surgery Consult Note    Requesting Attending Physician:  Guerry Bruin, *  Service Requesting Consult:  Oncology/Hematology (MDE)    Assessment:  Cristina Fields is a 70 y.o. 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 Since EUA at [facility] patient has undergone repeat EUA and shave biopsy resulting in large wound. Recommend continued wet to dry to wound. Okay to defer MRI given hx of CKD. Will continue to discuss possible diversion with patient and her family.    PLAN:   - Cont wet to dry dressings with daily dressing changes   - Okay to defer MRI   - Colorectal surgery will follow peripherally. Please page with any questions or concerns.     If you have any questions, concerns or changes in the patient's clinical status, please feel free to contact [facility] consult pager [phone]. Thank you for this consult.    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 Currently admitted to medicine for further work up. Colorectal surgery consulted for evaluation of anus as source of infection.     She was admitted on 4/25 for AMS. Patient unable to provide further hx. Per husband was seen at the [place] a few weeks prior and underwent repeat EUA with shave biopsy. Has been packing with wet to dry dressing. Per husband, wife does not experience discomfort with BM. Rather it is difficulty with cleaning that impact quality of life. They have not discussed a diverting ostomy with surgeon before.     Past Medical History:   Past Medical History:   Diagnosis Date   ??? CLL (chronic lymphocytic leukemia) (CMS-HCC)     relapsed   ??? CVA (cerebral vascular accident) (CMS-HCC)    ??? Diabetes (CMS-HCC)    ??? ESRD (end stage renal disease) on dialysis (CMS-HCC)    ??? HTN (hypertension)    ??? Palliative care patient        Past Surgical History:  Past Surgical History:   Procedure Laterality Date   ??? PR SURG DIAGNOSTIC EXAM, ANORECTAL N/A 01/30/2021    Procedure: ANORECTAL EUA with Biopsy;  Surgeon: Estelle June, MD;  Location: MAIN OR [facility];  Service: General Surgery       Medications:  No current facility-administered medications on file prior to encounter.     Current Outpatient Medications on File Prior to Encounter   Medication Sig Dispense Refill   ??? oxyCODONE (ROXICODONE) 5 MG immediate release tablet Take 5 mg by mouth every four (4) hours as needed for pain.     ??? zinc oxide 20 % ointment Apply 1 application topically as needed for dry skin. Apply to rectum topically every day for pain     ??? acetaminophen (TYLENOL) 500 MG tablet Take 2 tablets (1,000 mg total) by mouth every eight (8) hours. 30 tablet 0   ??? amLODIPine (NORVASC) 10 MG tablet Take 1 tablet by mouth daily.      ??? atorvastatin (LIPITOR) 40 MG tablet Take 40 mg by mouth daily.     ??? carvediloL (COREG) 12.5 MG tablet Take 12.5 mg by mouth Two (2) times a day.     ??? doxercalciferoL 2 mcg/mL Soln Infuse into a venous catheter. Pt taking at dialysis-unknown dosage     ???  hydrALAZINE (APRESOLINE) 10 MG tablet Take 10 mg by mouth Three (3) times a day.     ??? hydrocortisone 1 % cream Apply to affected area 2 times daily (Patient not taking: Reported on 01/25/2021) 30 g 0   ??? [EXPIRED] HYDROmorphone (DILAUDID) 2 MG tablet Take 0.5 tablets (1 mg total) by mouth every four (4) hours as needed for pain,moderate (4-6) for up to 5 days. 10 tablet 0   ??? lidocaine (XYLOCAINE) 5 % ointment Apply topically two (2) times a day as needed. Apply to rectum topically as needed for pain for up to two times a day     ??? loratadine (CLARITIN) 10 mg tablet Take 1 tablet (10 mg total) by mouth daily. 30 tablet 2   ??? losartan (COZAAR) 100 MG tablet Take 100 mg by mouth daily.     ??? methoxy peg-epoetin beta (MIRCERA INJ) 60 mcg. (Patient not taking: Reported on 01/25/2021)     ??? NIFEdipine (ADALAT CC) 60 MG 24 hr tablet Take 60 mg by mouth daily.     ??? ondansetron (ZOFRAN) 8 MG tablet Take 1 tablet by mouth every eight (8) hours. (Patient not taking: Reported on 01/25/2021)     ??? polyethylene glycol (GLYCOLAX) 17 gram/dose powder Take 17 g by mouth two (2) times a day as needed (for constipation). 238 g 0   ??? sertraline (ZOLOFT) 100 MG tablet Take 100 mg by mouth daily.     ??? zanubrutinib (BRUKINSA) capsule Take 2 capsules (160mg ) by mouth once daily three times a week after dialysis. Swallow whole with water. Do not open, break, or chew capsule. 24 capsule 5       Allergies:  Allergies   Allergen Reactions   ??? Lisinopril Cough and Other (See Comments)     Other reaction(s): Unknown       Family History:  Family History   Problem Relation Age of Onset   ??? Diabetes Mother    ??? Hyperlipidemia Mother    ??? Diabetes Father    ??? Hyperlipidemia Father        Social History:   Social History     Tobacco Use   ??? Smoking status: Never Smoker   ??? Smokeless tobacco: Never Used   Substance Use Topics   ??? Alcohol use: Not Currently   ??? Drug use: Never       Review of Systems  10 systems were reviewed and are negative except as noted specifically in the HPI.    Objective  Vitals:   Temp:  [36.5 ??C-37.3 ??C] 36.6 ??C  Heart Rate:  [68-83] 68  Resp:  [16-20] 16  BP: (134-173)/(53-69) 151/54  MAP (mmHg):  [81-89] 85  SpO2:  [93 %-97 %] 94 %  BMI (Calculated):  [22.52] 22.52      Intake/Output last 24 hours:    Intake/Output Summary (Last 24 hours) at 02/15/2021 1539  Last data filed at 02/15/2021 0100  Gross per 24 hour   Intake 360 ml   Output 1000 ml   Net -640 ml         Physical Exam:   Gen: Laying in bed, no distress   Heart: NRNR   Lungs: Breathing on room air  Abdomen: Soft, nontender to palpation. No rebound or guarding   Neuro: Alert to voice. Not alert to place or time  Rectal: Extensive anal condyloma with womb to left side with healthy appearance.

## 2021-02-15 NOTE — Unmapped (Signed)
Care Management  Initial Transition Planning Assessment              General  Care Manager assessed the patient by : In person interview with patient, Telephone conversation with family, Medical record review, Discussion with Clinical Care team (CM spoke with patient's husband by phone to confirm dialysis clinic as patient had been given a pain medication and wasn't able to remember.)  Orientation Level: Oriented X4  Functional level prior to admission: Independent  Reason for referral: Discharge Planning     CM met with patient in pt room.  Pt/visitors were not wearing hospital provided masks for the duration of the interaction with CM.   CM was wearing hospital provided surgical mask and hospital provided eye protection.  CM was not within 6 foot of the patient/visitors during this interaction.     Contact/Decision Maker  Extended Emergency Contact Information  Primary Emergency Contact: Darcus Pester of [employer] Phone: [phone]  Relation: Spouse  Secondary Emergency Contact: Hanley Hays  Mobile Phone: [phone]  Relation: Son    Armed forces operational officer Next of Kin / Guardian / POA / Advance Directives     HCDM (patient stated preference): Jaunita, Mikels - Spouse - [phone]    Advance Directive (Medical Treatment)  Does patient have an advance directive covering medical treatment?: Patient does not have advance directive covering medical treatment.  Reason patient does not have an advance directive covering medical treatment:: Patient does not wish to complete one at this time.    Health Care Decision Maker [HCDM] (Medical & Mental Health Treatment)  Healthcare Decision Maker: HCDM documented in the HCDM/Contact Info section.  Information offered on HCDM, Medical & Mental Health advance directives:: Patient declined information.         Patient Information  Lives with: Spouse/significant other    Type of Residence: Private residence     [address]  [place] [address] Support Systems/Concerns: Spouse    Responsibilities/Dependents at home?: No    Home Care services in place prior to admission?: No                  Equipment Currently Used at Home: shower chair, walker, rolling, commode chair       Currently receiving outpatient dialysis?: Yes  Facility providing dialysis (Name/Contact Info): [facility] [phone]    Financial Information       Need for financial assistance?: No       Social Determinants of Health  Social Determinants of Health     Tobacco Use: Low Risk    ??? Smoking Tobacco Use: Never Smoker   ??? Smokeless Tobacco Use: Never Used   Alcohol Use: Not on file   Financial Resource Strain: Low Risk    ??? Difficulty of Paying Living Expenses: Not very hard   Food Insecurity: No Food Insecurity   ??? Worried About Running Out of Food in the Last Year: Never true   ??? Ran Out of Food in the Last Year: Never true   Transportation Needs: No Transportation Needs   ??? Lack of Transportation (Medical): No   ??? Lack of Transportation (Non-Medical): No   Physical Activity: Not on file   Stress: Not on file   Social Connections: Not on file   Intimate Partner Violence: Not on file   Depression: Not on file   Housing/Utilities: Low Risk    ??? Within the past 12 months, have you ever stayed: outside,  in a car, in a tent, in an overnight shelter, or temporarily in someone else's home (i.e. couch-surfing)?: No   ??? Are you worried about losing your housing?: No   ??? Within the past 12 months, have you been unable to get utilities (heat, electricity) when it was really needed?: No   Substance Use: Not on file   Health Literacy: Not on file       Discharge Needs Assessment  Concerns to be Addressed: discharge planning    Clinical Risk Factors: > 65, Principal Diagnosis: Cancer, Stroke, COPD, Heart Failure, AMI, Pneumonia, Joint Replacment, Functional Limitations, Readmission < 30 Days, Multiple Diagnoses (Chronic)    Barriers to taking medications: No    Prior overnight hospital stay or ED visit in last 90 days: Yes    Readmission Within the Last 30 Days: current reason for admission unrelated to previous admission         Anticipated Changes Related to Illness: inability to care for self    Equipment Needed After Discharge: none    Discharge Facility/Level of Care Needs:  (TBD)    Readmission  Risk of Unplanned Readmission Score: UNPLANNED READMISSION SCORE: 23%  Predictive Model Details          23% (High)  Factor Value    Calculated 02/14/2021 12:00 21% Number of active Rx orders 35    [place] Risk of Unplanned Readmission Model 9% Diagnosis of cancer present    *Archived Data 8% ECG/EKG order present in last 6 months     8% Latest calcium low (8.6 mg/dL)     7% Latest BUN high (41 mg/dL)     6% Imaging order present in last 6 months     5% Latest hemoglobin low (8.7 g/dL)     5% Phosphorous result present     5% Age 70     5% Number of ED visits in last six months 1     4% Active anticoagulant Rx order present     4% Active corticosteroid Rx order present     4% Latest creatinine high (8.85 mg/dL)     3% Diagnosis of renal failure present     3% Charlson Comorbidity Index 3     2% Future appointment scheduled     1% Current length of stay 0.703 days      Readmitted Within the Last 30 Days? (No if blank)   Patient at risk for readmission?: Yes    Discharge Plan  Screen findings are: Discharge planning needs identified or anticipated (Comment). (Watch for PT/OT recs)    Expected Discharge Date: 02/22/2021    Expected Transfer from Critical Care:  (N/A)    Quality data for continuing care services shared with patient and/or representative?: No  Patient and/or family were provided with choice of facilities / services that are available and appropriate to meet post hospital care needs?: No       Initial Assessment complete?: Yes

## 2021-02-15 NOTE — Unmapped (Signed)
Pt A&Ox3 (disoriented to situation), MRI completed and follow up MRI ordered, neuro checks intact, pain managed with current regimen. Pt has not urinated, UA and UCX outstanding. Pt needs meals ordered by RN or CNA. IVs patent but need to be pushed slowly to ease discomfort. All meds given and swallowed without incident with water. No other acute events, will report to AM RN.    Problem: Adult Inpatient Plan of Care  Goal: Plan of Care Review  Outcome: Ongoing - Unchanged  Goal: Patient-Specific Goal (Individualized)  Outcome: Ongoing - Unchanged  Goal: Absence of Hospital-Acquired Illness or Injury  Outcome: Ongoing - Unchanged  Intervention: Identify and Manage Fall Risk  Recent Flowsheet Documentation  Taken 02/15/2021 0324 by Carmelia Bake, RN  Safety Interventions:   bed alarm   fall reduction program maintained   family at bedside   lighting adjusted for tasks/safety   low bed  Intervention: Prevent Skin Injury  Recent Flowsheet Documentation  Taken 02/15/2021 0324 by Carmelia Bake, RN  Skin Protection: adhesive use limited  Intervention: Prevent and Manage VTE (Venous Thromboembolism) Risk  Recent Flowsheet Documentation  Taken 02/15/2021 0324 by Carmelia Bake, RN  Activity Management: activity adjusted per tolerance  VTE Prevention/Management: fluids promoted  Goal: Optimal Comfort and Wellbeing  Outcome: Ongoing - Unchanged  Goal: Readiness for Transition of Care  Outcome: Ongoing - Unchanged  Goal: Rounds/Family Conference  Outcome: Ongoing - Unchanged     Problem: Self-Care Deficit  Goal: Improved Ability to Complete Activities of Daily Living  Outcome: Ongoing - Unchanged     Problem: Fall Injury Risk  Goal: Absence of Fall and Fall-Related Injury  Outcome: Ongoing - Unchanged  Intervention: Promote Injury-Free Environment  Recent Flowsheet Documentation  Taken 02/15/2021 0324 by Carmelia Bake, RN  Safety Interventions:   bed alarm   fall reduction program maintained   family at bedside   lighting adjusted for tasks/safety   low bed     Problem: Impaired Wound Healing  Goal: Optimal Wound Healing  Outcome: Ongoing - Unchanged  Intervention: Promote Wound Healing  Recent Flowsheet Documentation  Taken 02/15/2021 0324 by Carmelia Bake, RN  Activity Management: activity adjusted per tolerance     Problem: Device-Related Complication Risk (Hemodialysis)  Goal: Safe, Effective Therapy Delivery  Outcome: Ongoing - Unchanged     Problem: Hemodynamic Instability (Hemodialysis)  Goal: Effective Tissue Perfusion  Outcome: Ongoing - Unchanged     Problem: Infection (Hemodialysis)  Goal: Absence of Infection Signs and Symptoms  Outcome: Ongoing - Unchanged

## 2021-02-15 NOTE — Unmapped (Signed)
OCCUPATIONAL THERAPY  Evaluation (02/15/21 1111)    Patient Name:  Cristina Fields       Medical Record Number: [id number]   Date of Birth: [birth year]  Sex: Female          OT Treatment Diagnosis:  Decreased activity tolerance, decreased functional strength, decreased functional mobility, decreased AROM, decreased cognition, and increased pain impacting participation in ADL/IADL routines    Assessment    Problem List: Decreased strength, Decreased endurance, Impaired balance, Decreased mobility, Decreased safety awareness, Impaired judgement, Decreased cognition, Decreased coordination, Impaired ADLs, Fall Risk, Pain, Decreased range of motion    Clinical Decision Making: Moderate    Assessment: Cristina Fields is a 70 y.o. female with CLL, ESRD on iHD, hypertension, prior cerebellar infarct that presented to [facility] as a direct admission from infusion clinic with disorientation and pain. Cristina Fields presents to skilled acute OT with decreased activity tolerance, decreased functional strength, decreased balance, decreased functional mobility, decreased cognition, and increased pain impacting participation in ADL/IADL routines. Cristina Fields is a questionable historian d/t decreased cognition, but she reports being independent at functional baseline, now receiving assistance from her husband for a lot. During today's eval, she was able to complete bed mobility with SBA/mod A, functional transfers EOB with CGA + RW, a few small steps EOB with CGA + RW, and standing peri hygiene with CGA + RW. She additionally required frequent verbal cues for sequencing/problem solving activity completion. Cristina Fields's performance was limited by dizziness during standing activity, ultimately presenting with hypertension. She will benefit from skilled acute OT to maximize functional safety and independence with ADL/IADL routines. Based on review of comorbidities, personal factors, and clinical presentation of exam findings, Cristina Fields demonstrates a moderate complexity case for evaluation and treatment. Recommend post-acute OT 5x/wk (low intensity) at discharge.    Today's Interventions: Cristina Fields completed and assisted with self-feeding, bed mobility, sitting balance/tolerance, functional transfers, standing balance/tolerance, standing peri hygiene, and functional mobility. Cristina Fields provided education re: role of skilled acute OT, importance of EOB/OOB activity, and OT POC.    Activity Tolerance During Today's Session  Limited by cognition (hypertension)    Plan  Planned Frequency of Treatment:  1-2x per day for: 3-4x week     Planned Interventions:  Adaptive equipment, ADL retraining, Balance activities, Bed mobility, Compensatory tech. training, Conservation, Education - Patient, Home exercise program, Functional mobility, Functional cognition, Environmental support, Endurance activities, Education - Family / caregiver, Range of motion, Safety education, Teacher, early years/pre, UE Strength / coordination exercise, Therapeutic exercise    Post-Discharge Occupational Therapy Recommendations:   5x weekly, Low intensity   OT DME Recommendations: Defer to post acute  GOALS:   Patient and Family Goals: To travel and eat    Long Term Goal #1: Cristina Fields will score 20+ on the AMPAC within 4 weeks     Short Term:  Cristina Fields will complete BSC t/f and toileting routine with mod I + LRAD prn   Time Frame : 2 weeks  Cristina Fields will complete UB dressing with mod I   Time Frame : 2 weeks  Cristina Fields will complete LB dressing with mod I   Time Frame : 2 weeks  Cristina Fields will complete 3+ min standing grooming with mod I + LRAD prn   Time Frame : 2 weeks     Prognosis:  Good  Positive Indicators:  PLOF  Barriers to Discharge: Cognitive deficits, Decreased caregiver support, Endurance deficits, Functional strength deficits, Impaired Balance, Inability to safely perform ADLS, Severity of deficits  Subjective  Current Status Cristina Fields recieved/left semi-reclined in bed with call bell in reach, all immediate needs met, bed alarm activated, and RN/MD present/updated  Prior Functional Status Cristina Fields is questionable historian d/t decreased cognition - disoriented to month, place, and situation and presenting with deficits in short term memory, word finding, processing, and command follow. However, Cristina Fields does present with improving cognition, able to hold a conversation about her history/prior level. She reported that she is typically active/independent at her functional baseline, but now her husband does a lot for her. They are originally from [place] but moved to [place] around the time she was diagnosed. She previously worked as an Charity fundraiser in [employer]    Medical Tests / Procedures: Reviewed       Patient / Caregiver reports: Cristina Fields agreeable to OT eval    Past Medical History:   Diagnosis Date   ??? CLL (chronic lymphocytic leukemia) (CMS-HCC)     relapsed   ??? CVA (cerebral vascular accident) (CMS-HCC)    ??? Diabetes (CMS-HCC)    ??? ESRD (end stage renal disease) on dialysis (CMS-HCC)    ??? HTN (hypertension)    ??? Palliative care patient     Social History     Tobacco Use   ??? Smoking status: Never Smoker   ??? Smokeless tobacco: Never Used   Substance Use Topics   ??? Alcohol use: Not Currently      Past Surgical History:   Procedure Laterality Date   ??? PR SURG DIAGNOSTIC EXAM, ANORECTAL N/A 01/30/2021    Procedure: ANORECTAL EUA with Biopsy;  Surgeon: Estelle June, MD;  Location: MAIN OR [facility];  Service: General Surgery    Family History   Problem Relation Age of Onset   ??? Diabetes Mother    ??? Hyperlipidemia Mother    ??? Diabetes Father    ??? Hyperlipidemia Father         Lisinopril     Objective Findings  Precautions / Restrictions  Falls precautions, Other precautions (bleeding)    Weight Bearing  Non-applicable    Required Braces or Orthoses  Non-applicable    Communication Preference  Verbal    Pain  Cristina Fields reported pain in biltaeral shoulders and neck, did not quantify, activity adjusted per Cristina Fields's tolerance, and RN aware    Equipment / Environment Vascular access (PIV, TLC, Port-a-cath, PICC), Patient not wearing mask for full session    Living Situation  Living Environment: House  Lives With: Spouse  Home Living: One level home, Walk-in shower, Level entry, Standard height toilet (needs clarification)  Equipment available at home: None (needs clarification)     Cognition   Orientation Level:  Disoriented to place, Disoriented to time, Disoriented to situation (oriented to year but not month)   Arousal/Alertness:  Inconsistent responses to stimuli   Attention Span:  Difficulty attending to directions, Attends with cues to redirect   Memory:  Decreased short term memory   Following Commands:  Follows one step commands with increased time, Follows one step commands with repetition   Safety Judgment:  Decreased awareness of need for assistance, Decreased awareness of need for safety   Awareness of Errors:  Decreased awareness of need for assistance, Decreased awareness of need for safety   Problem Solving:  Assistance required to identify errors made, Assistance required to generate solutions, Assistance required to implement solutions   Comments: Presents with deficits in short term memory, word finding, processing, and command follow, often responding inappropriately to questions  Vision / Hearing   Vision: Glasses not present, Wears glasses all the time     Hearing: No deficit identified       Hand Function:  Right Hand Function: Right hand function impaired  Right Hand Impairment: grip strength fair, serial opposition impaired (decreased speed with serial opposition)  Left Hand Function: Left hand function impaired  Left Hand Impairment: grip strength fair, serial opposition impaired (decreased speed with serial opposition)  Hand Function comments: Cristina Fields reported difficulty manipulating small objects  Hand Dominance: Right    Skin Inspection:  Skin Inspection:  (anal lesion)    ROM / Strength:  UE ROM/Strength: Left Impaired/Limited, Right Impaired/Limited  RUE Impairment: Limited AROM, Pain with movement, Reduced strength  LUE Impairment: Limited AROM, Pain with movement, Reduced strength  UE ROM/ Strength Comment: Cristina Fields able to flex shoulders to ~90*  LE ROM/Strength: Left WFL, Right WFL    Coordination:  Coordination: Decreased speed    Sensation:  RUE Sensation: RUE intact  LUE Sensation: LUE intact    Balance:  sitting: SBA; standing: CGA + RW    Functional Mobility  Transfer Assistance Needed: Yes  Transfers - Needs Assistance: Contact Guard assist (Cristina Fields completed sit<>stand with CGA + RW)  Bed Mobility Assistance Needed: Yes  Bed Mobility - Needs Assistance: Standby assist, Mod assist (Cristina Fields completed sup>sit with SBA and HOB raised, sit>sup with mod A for BLE management)  Ambulation: Cristina Fields took small steps at EOB with CGA + RW    ADLs  ADLs: Needs assistance with ADLs  ADLs - Needs Assistance: Feeding, Grooming, Bathing, Toileting, UB dressing, LB dressing  Feeding - Needs Assistance: Performed seated, Requires additional structure (Cristina Fields able to spoon yogurt into mouth at bed-level with setup A; anticipate SBA for seated self-feeding)  Grooming - Needs Assistance: Performed seated, Min assist (anticipate min A d/t limited AROM/pain with movement of BUE)  Bathing - Needs Assistance: Performed seated, Mod assist (anticipate mod A)  Toileting - Needs Assistance: Min assist, Performed seated, Performed standing, Requires additional structure (per CGA for sit<>stand anticipate CGA for toilet t/f; Cristina Fields complete peri hygiene by posteriorly reaching with RUE in standing with CGA + RW, requiring assistance to complete task d/t decreased standing activity tolerance)  UB Dressing - Needs Assistance: Performed seated, Min assist (anticipate min A d/t limited AROM/increased pain w/ movement in BUE)  LB Dressing - Needs Assistance: Max assist, Performed seated (anticipate max A)    Vitals / Orthostatics  At Rest: NAD  With Activity: spO2: 96% on RA, HR: 86, BP: 175/62 while seated EOB following standing peri hygiene at EOB; BP: 172/59 while semi-reclined in bed  Orthostatics: Cristina Fields reported feeling lightheaded during standing peri hygiene. Activity was adjusted per Cristina Fields's tolerance, Cristina Fields returned to semi-reclined, RN/MD were made aware, and MD arrived to assess    Medical Staff Made Aware: RN    Occupational Therapy Session Duration  OT Individual [mins]: 50       I attest that I have reviewed the above information.  Signed: Ancil Linsey, OT  Ceasar Mons 02/15/2021

## 2021-02-15 NOTE — Unmapped (Signed)
Vancomycin Therapeutic Monitoring Pharmacy Note    Cristina Fields is a 70 y.o. female starting vancomycin. Date of therapy initiation: 02/14/21    Indication: Bacteremia/Sepsis    Prior Dosing Information: None/new initiation     Goals:  Therapeutic Drug Levels  Vancomycin trough goal: 15-20 mg/L    Additional Clinical Monitoring/Outcomes  Renal function, volume status (intake and output)    Results: Not applicable    Wt Readings from Last 1 Encounters:   02/14/21 50.6 kg (111 lb 8.8 oz)     Creatinine   Date Value Ref Range Status   02/14/2021 8.85 (H) 0.60 - 0.80 mg/dL Final   16/07/9603 5.40 (H) 0.60 - 0.80 mg/dL Final   98/08/9146 8.29 (H) 0.60 - 0.80 mg/dL Final        Pharmacokinetic Considerations and Significant Drug Interactions:  ??? iHD on TuThSat  ??? Concurrent nephrotoxic meds: not applicable    Assessment/Plan:  Recommendation(s)  ??? Give vancomycin 1 gram x 1   ??? Estimated trough on recommended regimen: Not applicable - dosing by level    Follow-up  ??? Level due: prior to next HD session  ??? A pharmacist will continue to monitor and order levels as appropriate    Please page service pharmacist with questions/clarifications.    Maryln Manuel, PharmD

## 2021-02-15 NOTE — Unmapped (Signed)
WOCN Consult Services                                                                 Wound Evaluation     Reason for Consult:   - Dermatological Condition  - Initial    Problem List:   Principal Problem:    Encephalopathy acute  Active Problems:    CLL (chronic lymphoid leukemia) in relapse (CMS-HCC)    Condyloma    Anal lesion    Thrombocytopenia (CMS-HCC)    CKD (chronic kidney disease) requiring chronic dialysis (CMS-HCC)    Assessment: Per EMR- Cristina Fields??is a 70 y.o.??female??with CLL, ESRD on iHD, hypertension, prior??cerebellar infarct??that presented to [facility]??as a direct admission from infusion clinic??with??disorientation and pain.??Found to have Strep Bovis bacteremia.     We were consulted regarding a large perianal condyloma.     I spoke with Dr. Aletha Halim by phone regarding the consult. She relayed that Colorectal surgery saw the patient but would not recommend any surgical intervention during this admission. They recommended wet to dry dressings.    I explained that we don't have anything to offer this patient regarding treatment. I did suggest that the wet to dry dressing include Vashe rather than normal saline solution.     Nursing can apply daily and PRN.     We will sign off at this time.      Continence Status:   Incontinent of bowel: Incontinence care required    Moisture Associated Skin Damage:   - Not assessed     Lab Results   Component Value Date    WBC 11.6 (H) 02/15/2021    HGB 8.8 (L) 02/15/2021    HCT 26.6 (L) 02/15/2021    GLU 78 02/15/2021    POCGLU 121 01/30/2021    ALBUMIN 2.6 (L) 02/14/2021    PROT 5.0 (L) 02/14/2021       WOCN Recommendations:   - Wet to dry dressings BID with Vashe..    Topical Therapy/Interventions:   - Wet to dry with Vashe BID    Recommended Consults:  - Not Applicable    WOCN Follow Up:  - We will sign off at this time. Please reconsult the WOCN Consult Service if you would like [place] to participate further in the care of this patient.     Plan of Care Discussed With:   - LIP [place]    Supplies Ordered: No    Workup Time:   30 minutes     Jeanelle Malling RN BS [facility]  (Pager)- [phone]  (Office)- [phone]

## 2021-02-15 NOTE — Unmapped (Signed)
NEUROSURGERY CONSULT NOTE    Requesting Attending Physician:  Guerry Bruin, *  Service Requesting Consult:  Oncology/Hematology (MDE)    Assessment/Recommendation  Cristina Fields is a 70 y.o. female with ESRD/CLL who presents with encephalopathy. Clinical presentation is not consistent with aneurysmal rupture. Notable for thrombocytopenia. MRI w T2 flair signal around aqueduct + 4th ventricle. We feel it's within normal limits however given radiographic concern for blood products, recommend LP to evaluate for xanthochromia. Discussed with overnight team who were not able to complete the procedure. SBP management was discussed as well.  - No data to guide thrombocytopenia goals, defer to oncology  - Please perform LP prior to MRA. If LP negative for xanthrochromia, can conclude workup and forgoe imaging  - If LP w xanthochromia, please obtain MRA and Time of Flight imaging (discuss with radiology)    Patient was discussed with Dr. Loyce Dys, MD and will be staffed with Dr. Zara Council. Please page the Neurosurgery consult pager at [phone] with questions/concerns.     Problem List  Principal Problem:    Encephalopathy acute  Active Problems:    CLL (chronic lymphoid leukemia) in relapse (CMS-HCC)    Condyloma    Anal lesion    Thrombocytopenia (CMS-HCC)    CKD (chronic kidney disease) requiring chronic dialysis (CMS-HCC)  Resolved Problems:    * No resolved hospital problems. *      HPI  Cristina Fields is a 70 y.o. female with CLL / ESRD seen in consultation at the request of Guerry Bruin, * for evaluation of encephalopathy and MRI with some T2 flair signal around aqueduct and 4th ventricle. History is not consistent with anuerysmal rupture. No HA, meningismus, neck pain, blurry vision, n/v, seizures or weakness.    ______________________________________________________________________    Physical Exam  General: No acute distress; Body mass index is 22.53 kg/m??.    Neurologic:  Eyes open spontaneously  Oriented to name, location, time  Pupils equal and reactive bilaterally  Extraocular movements intact bilaterally  Facial sensation intact V1-V3 bilaterally  Face symmetric  Tongue protrudes in the midline  Full shrug bilaterally  Motor strength 5/5 x 4  No pronator drift  Absent Hoffman sign bilaterally  Absent ankle clonus bilaterally  Sensation to light touch intact throughout    Cardiovascular: Warm and well perfused with good pulses, no edema  Pulmonary: Unlabored breathing, no pursed lips or wheezing, acyanotic  Abdomen: Soft, nontender, nondistended    Historical data    Allergies  Lisinopril    Medications  Medications updated and/or reviewed in Epic.  No current facility-administered medications on file prior to encounter.     Current Outpatient Medications on File Prior to Encounter   Medication Sig Dispense Refill   ??? oxyCODONE (ROXICODONE) 5 MG immediate release tablet Take 5 mg by mouth every four (4) hours as needed for pain.     ??? zinc oxide 20 % ointment Apply 1 application topically as needed for dry skin. Apply to rectum topically every day for pain     ??? acetaminophen (TYLENOL) 500 MG tablet Take 2 tablets (1,000 mg total) by mouth every eight (8) hours. 30 tablet 0   ??? amLODIPine (NORVASC) 10 MG tablet Take 1 tablet by mouth daily.      ??? atorvastatin (LIPITOR) 40 MG tablet Take 40 mg by mouth daily.     ??? carvediloL (COREG) 12.5 MG tablet Take 12.5 mg by mouth Two (2) times a day.     ???  doxercalciferoL 2 mcg/mL Soln Infuse into a venous catheter. Pt taking at dialysis-unknown dosage     ??? hydrALAZINE (APRESOLINE) 10 MG tablet Take 10 mg by mouth Three (3) times a day.     ??? hydrocortisone 1 % cream Apply to affected area 2 times daily (Patient not taking: Reported on 01/25/2021) 30 g 0   ??? [EXPIRED] HYDROmorphone (DILAUDID) 2 MG tablet Take 0.5 tablets (1 mg total) by mouth every four (4) hours as needed for pain,moderate (4-6) for up to 5 days. 10 tablet 0   ??? lidocaine (XYLOCAINE) 5 % ointment Apply topically two (2) times a day as needed. Apply to rectum topically as needed for pain for up to two times a day     ??? loratadine (CLARITIN) 10 mg tablet Take 1 tablet (10 mg total) by mouth daily. 30 tablet 2   ??? losartan (COZAAR) 100 MG tablet Take 100 mg by mouth daily.     ??? methoxy peg-epoetin beta (MIRCERA INJ) 60 mcg. (Patient not taking: Reported on 01/25/2021)     ??? NIFEdipine (ADALAT CC) 60 MG 24 hr tablet Take 60 mg by mouth daily.     ??? ondansetron (ZOFRAN) 8 MG tablet Take 1 tablet by mouth every eight (8) hours. (Patient not taking: Reported on 01/25/2021)     ??? polyethylene glycol (GLYCOLAX) 17 gram/dose powder Take 17 g by mouth two (2) times a day as needed (for constipation). 238 g 0   ??? sertraline (ZOLOFT) 100 MG tablet Take 100 mg by mouth daily.     ??? zanubrutinib (BRUKINSA) capsule Take 2 capsules (160mg ) by mouth once daily three times a week after dialysis. Swallow whole with water. Do not open, break, or chew capsule. 24 capsule 5       Past Medical History  Past Medical History:   Diagnosis Date   ??? CLL (chronic lymphocytic leukemia) (CMS-HCC)     relapsed   ??? CVA (cerebral vascular accident) (CMS-HCC)    ??? Diabetes (CMS-HCC)    ??? ESRD (end stage renal disease) on dialysis (CMS-HCC)    ??? HTN (hypertension)    ??? Palliative care patient        Past Surgical History  Past Surgical History:   Procedure Laterality Date   ??? PR SURG DIAGNOSTIC EXAM, ANORECTAL N/A 01/30/2021    Procedure: ANORECTAL EUA with Biopsy;  Surgeon: Estelle June, MD;  Location: MAIN OR [facility];  Service: General Surgery       Family History  Family History   Problem Relation Age of Onset   ??? Diabetes Mother    ??? Hyperlipidemia Mother    ??? Diabetes Father    ??? Hyperlipidemia Father        Social History  Social History     Tobacco Use   ??? Smoking status: Never Smoker   ??? Smokeless tobacco: Never Used   Substance Use Topics   ??? Alcohol use: Not Currently   ??? Drug use: Never       Review of Systems  A 10-system review of systems was conducted and was negative except as documented above in the HPI.    The patient's vitals, intake/output, medications, labs, and relevant neurological imaging were independently reviewed.

## 2021-02-15 NOTE — Unmapped (Signed)
Adult Nutrition Assessment Note    Visit Type: RN Consult, MD Consult  Reason for Visit: Have you had a decrease in food intake or appetite?      HPI & PMH:  Cristina Fields is a 70 y.o. female with PMHx of CKD on dialysis, CVA w/ residual tongue deviation, and CLL that presented to [facility] with severe rectal pain, N/V, diarrhea, and anemia.  Past Medical History:   Diagnosis Date   ??? CLL (chronic lymphocytic leukemia) (CMS-HCC)     relapsed   ??? CVA (cerebral vascular accident) (CMS-HCC)    ??? Diabetes (CMS-HCC)    ??? ESRD (end stage renal disease) on dialysis (CMS-HCC)    ??? HTN (hypertension)    ??? Palliative care patient          Anthropometric Data:  Height:   149.9cm  Admission weight: 50.6 kg (111 lb 8.8 oz)  Last recorded weight: 50.6 kg (111 lb 8.8 oz)  IBW:  44.5kg  Percent IBW:  113%  BMI: Body mass index is 22.53 kg/m??.   Usual Body Weight: Unable to obtain at this time     Weight history prior to admission: no significant weight changes since 11/04/20   Wt Readings from Last 10 Encounters:   02/14/21 50.6 kg (111 lb 8.8 oz)   02/15/21 50.6 kg (111 lb 8.8 oz)   02/03/21 50.5 kg (111 lb 4.8 oz)   01/26/21 51.3 kg (113 lb 1.5 oz)   01/28/21 51.3 kg (113 lb 1.5 oz)   01/25/21 51.6 kg (113 lb 12.8 oz)   12/26/20 52 kg (114 lb 11.2 oz)   12/07/20 50.7 kg (111 lb 11.2 oz)   11/04/20 49.9 kg (110 lb)        Weight changes this admission:   Last 5 Recorded Weights    02/14/21 0620   Weight: 50.6 kg (111 lb 8.8 oz)        Nutrition Focused Physical Exam:  Unable to complete at this time due to patient availability       NUTRITIONALLY RELEVANT DATA     Medications:   Nutritionally pertinent medications reviewed and evaluated for potential food and/or medication interactions.     Labs:   Nutritionally pertinent labs reviewed.     Nutrition History:   February 15, 2021: Prior to admission: RD Visit today, Pt out-of-room at visit. RN report Pt in MRI. Pt remains very confused.      RD Visit 01/27/21,  Patient reports eating less than usual for the past 5 weeks due to decreased appetite. Endorses N/V/D one day before admission. Does not endorse constipation, abdominal pain, or chewing/swallowing difficulty. Patient states she has been drinking 1 Novasource daily since September 2021. Patient admitted for 0 days. Patient eating her first hospital meal at time of visit, reports having an improved appetite. Agreeable to oral nutrition supplement.     Allergies, Intolerances, Sensitivities, and/or Cultural/Religious Dietary Restrictions: none identified at this time     Current Nutrition:  Oral intake        Nutrition Orders   (From admission, onward)             Start     Ordered    02/13/21 1951  Nutrition Therapy Regular/House  Effective now        Question:  Nutrition Therapy:  Answer:  Regular/House    02/13/21 1950                   Nutritional  Needs:   Energy: 1250-1500 kcals 25-30 kcal/kg using admission body weight, 50 kg (02/15/21 1013)]  Protein: 60-75 gm [1.2-1.5 gm/kg using admission body weight, 50 kg (02/15/21 1013)]  Carbohydrate:   [no restriction]  Fluid: 1500 mL [30 mL/kg]      Malnutrition assessment not yet completed at this time due to inability to complete nutrition focused physical exam (NFPE).    GOALS and EVALUATION     ??? Patient to consume 50% or greater of po intake via combination of meals, snacks, and/or oral supplements within week.  - New    Motivation, Barriers, and Compliance:  Evaluation of motivation, barriers, and compliance pending at this time due to clinical status.     NUTRITION ASSESSMENT     ??? Medical Team managing pain symptoms, RD will continue monitor impact on nutrition.  ??? Pt weight is w/o significant changes. RD will plan restart home oral supplement but increased frequency  ??? Creat (>4) Pt is on HD.      Discharge Planning:   Monitor via CAPP rounds for any discharge planning needs.      NUTRITION INTERVENTIONS and RECOMMENDATION     1. Assist all meal orders and meal tray set-up  2. Added nepro BID    Follow-Up Parameters:   1-2 times per week (and more frequent as indicated)    Ed Blalock, MS, RD, CSO, LDN  Pager # [phone]

## 2021-02-15 NOTE — Telephone Encounter (Signed)
NP called [name] to remind of visit scheduled today.  He said visit will not hold because patient was admitted into the hospital 2 days ago.  Ample emotional support provided.  He was open to a palliative care visit in the future when patient is discharged from the hospital.

## 2021-02-16 DIAGNOSIS — R7881 Bacteremia: Secondary | ICD-10-CM | POA: Diagnosis not present

## 2021-02-16 DIAGNOSIS — D631 Anemia in chronic kidney disease: Secondary | ICD-10-CM | POA: Diagnosis not present

## 2021-02-16 DIAGNOSIS — A5131 Condyloma latum: Secondary | ICD-10-CM | POA: Diagnosis not present

## 2021-02-16 DIAGNOSIS — N186 End stage renal disease: Secondary | ICD-10-CM | POA: Diagnosis not present

## 2021-02-16 DIAGNOSIS — C9112 Chronic lymphocytic leukemia of B-cell type in relapse: Secondary | ICD-10-CM | POA: Diagnosis not present

## 2021-02-16 DIAGNOSIS — G9203 Immune effector cell-associated neurotoxicity syndrome, grade 3: Secondary | ICD-10-CM | POA: Diagnosis not present

## 2021-02-16 DIAGNOSIS — D649 Anemia, unspecified: Secondary | ICD-10-CM | POA: Diagnosis not present

## 2021-02-16 DIAGNOSIS — G9340 Encephalopathy, unspecified: Secondary | ICD-10-CM | POA: Diagnosis not present

## 2021-02-16 DIAGNOSIS — B954 Other streptococcus as the cause of diseases classified elsewhere: Secondary | ICD-10-CM | POA: Diagnosis not present

## 2021-02-16 DIAGNOSIS — Z992 Dependence on renal dialysis: Secondary | ICD-10-CM | POA: Diagnosis not present

## 2021-02-16 LAB — CBC W/ AUTO DIFF
BASOPHILS ABSOLUTE COUNT: 0.2 10*9/L — ABNORMAL HIGH (ref 0.0–0.1)
BASOPHILS RELATIVE PERCENT: 1.4 %
EOSINOPHILS ABSOLUTE COUNT: 0.1 10*9/L (ref 0.0–0.5)
EOSINOPHILS RELATIVE PERCENT: 1.2 %
HEMATOCRIT: 28.6 % — ABNORMAL LOW (ref 34.0–44.0)
HEMOGLOBIN: 9.4 g/dL — ABNORMAL LOW (ref 11.3–14.9)
LYMPHOCYTES ABSOLUTE COUNT: 9.1 10*9/L — ABNORMAL HIGH (ref 1.1–3.6)
LYMPHOCYTES RELATIVE PERCENT: 77.4 %
MEAN CORPUSCULAR HEMOGLOBIN CONC: 33 g/dL (ref 32.0–36.0)
MEAN CORPUSCULAR HEMOGLOBIN: 27.5 pg (ref 25.9–32.4)
MEAN CORPUSCULAR VOLUME: 83.4 fL (ref 77.6–95.7)
MEAN PLATELET VOLUME: 10.7 fL (ref 6.8–10.7)
MONOCYTES ABSOLUTE COUNT: 0 10*9/L — ABNORMAL LOW (ref 0.3–0.8)
MONOCYTES RELATIVE PERCENT: 0.3 %
NEUTROPHILS ABSOLUTE COUNT: 2.3 10*9/L (ref 1.8–7.8)
NEUTROPHILS RELATIVE PERCENT: 19.7 %
PLATELET COUNT: 62 10*9/L — ABNORMAL LOW (ref 150–450)
RED BLOOD CELL COUNT: 3.43 10*12/L — ABNORMAL LOW (ref 3.95–5.13)
RED CELL DISTRIBUTION WIDTH: 16.6 % — ABNORMAL HIGH (ref 12.2–15.2)
WBC ADJUSTED: 11.7 10*9/L — ABNORMAL HIGH (ref 3.6–11.2)

## 2021-02-16 LAB — BASIC METABOLIC PANEL
ANION GAP: 11 mmol/L (ref 5–14)
BLOOD UREA NITROGEN: 25 mg/dL — ABNORMAL HIGH (ref 9–23)
BUN / CREAT RATIO: 4
CALCIUM: 9 mg/dL (ref 8.7–10.4)
CHLORIDE: 102 mmol/L (ref 98–107)
CO2: 25 mmol/L (ref 20.0–31.0)
CREATININE: 5.85 mg/dL — ABNORMAL HIGH
EGFR CKD-EPI AA FEMALE: 8 mL/min/{1.73_m2} — ABNORMAL LOW (ref >=60)
EGFR CKD-EPI NON-AA FEMALE: 7 mL/min/{1.73_m2} — ABNORMAL LOW (ref >=60)
GLUCOSE RANDOM: 142 mg/dL (ref 70–179)
POTASSIUM: 3.7 mmol/L (ref 3.4–4.8)
SODIUM: 138 mmol/L (ref 135–145)

## 2021-02-16 LAB — MAGNESIUM: MAGNESIUM: 1.8 mg/dL (ref 1.6–2.6)

## 2021-02-16 LAB — PHOSPHORUS: PHOSPHORUS: 5 mg/dL (ref 2.4–5.1)

## 2021-02-16 LAB — SLIDE REVIEW

## 2021-02-16 MED ADMIN — losartan (COZAAR) tablet 100 mg: 100 mg | ORAL | @ 13:00:00

## 2021-02-16 MED ADMIN — epoetin alfa-EPBX (RETACRIT) injection 1,000 Units: 1000 [IU] | INTRAVENOUS | @ 19:00:00

## 2021-02-16 MED ADMIN — cefTRIAXone (ROCEPHIN) 2 g in sodium chloride 0.9 % (NS) 100 mL IVPB-connector bag: 2 g | INTRAVENOUS | @ 03:00:00 | Stop: 2021-02-22

## 2021-02-16 MED ADMIN — sertraline (ZOLOFT) tablet 100 mg: 100 mg | ORAL | @ 02:00:00

## 2021-02-16 MED ADMIN — oxyCODONE (ROXICODONE) immediate release tablet 5 mg: 5 mg | ORAL | @ 23:00:00 | Stop: 2021-02-27

## 2021-02-16 MED ADMIN — cefTRIAXone (ROCEPHIN) 2 g in sodium chloride 0.9 % (NS) 100 mL IVPB-connector bag: 2 g | INTRAVENOUS | @ 16:00:00 | Stop: 2021-02-23

## 2021-02-16 MED ADMIN — paricalcitoL (ZEMPLAR) injection 5 mcg: 5 ug | INTRAVENOUS | @ 19:00:00

## 2021-02-16 MED ADMIN — atorvastatin (LIPITOR) tablet 40 mg: 40 mg | ORAL | @ 13:00:00

## 2021-02-16 MED ADMIN — diclofenac sodium (VOLTAREN) 1 % gel 2 g: 2 g | TOPICAL | @ 02:00:00

## 2021-02-16 MED ADMIN — hydrALAZINE (APRESOLINE) tablet 10 mg: 10 mg | ORAL | @ 02:00:00

## 2021-02-16 MED ADMIN — NIFEdipine (PROCARDIA XL) 24 hr tablet 60 mg: 60 mg | ORAL | @ 13:00:00

## 2021-02-16 MED ADMIN — diclofenac sodium (VOLTAREN) 1 % gel 2 g: 2 g | TOPICAL | @ 16:00:00

## 2021-02-16 MED ADMIN — carvediloL (COREG) tablet 25 mg: 25 mg | ORAL | @ 02:00:00

## 2021-02-16 MED ADMIN — acetaminophen (TYLENOL) tablet 1,000 mg: 1000 mg | ORAL | @ 17:00:00

## 2021-02-16 MED ADMIN — hydrALAZINE (APRESOLINE) tablet 10 mg: 10 mg | ORAL | @ 13:00:00

## 2021-02-16 MED ADMIN — gentamicin-sodium citrate lock solution in NS: 2.1 mL | @ 23:00:00

## 2021-02-16 MED ADMIN — hydrALAZINE (APRESOLINE) tablet 10 mg: 10 mg | ORAL | @ 17:00:00

## 2021-02-16 MED ADMIN — oxyCODONE (ROXICODONE) immediate release tablet 5 mg: 5 mg | ORAL | @ 02:00:00 | Stop: 2021-02-27

## 2021-02-16 MED ADMIN — acetaminophen (TYLENOL) tablet 1,000 mg: 1000 mg | ORAL | @ 02:00:00

## 2021-02-16 MED ADMIN — carvediloL (COREG) tablet 25 mg: 25 mg | ORAL | @ 13:00:00

## 2021-02-16 MED ADMIN — acetaminophen (TYLENOL) tablet 1,000 mg: 1000 mg | ORAL | @ 13:00:00

## 2021-02-16 NOTE — Unmapped (Signed)
Pt here for scheduled iHD tx.  Left UE AVG accessed -- arterial needle placed without dfifficulty.  First venous cannulation had dark blood and a large clot.  Second venous needle placed with success.  Will continue to monitor throughout tx.    Problem: Device-Related Complication Risk (Hemodialysis)  Goal: Safe, Effective Therapy Delivery  Outcome: Ongoing - Unchanged     Problem: Hemodynamic Instability (Hemodialysis)  Goal: Effective Tissue Perfusion  Outcome: Ongoing - Unchanged     Problem: Infection (Hemodialysis)  Goal: Absence of Infection Signs and Symptoms  Outcome: Ongoing - Unchanged

## 2021-02-16 NOTE — Unmapped (Signed)
[facility] Nephrology Hemodialysis Procedure Note     02/16/2021    Cristina Fields was seen and examined on hemodialysis    CHIEF COMPLAINT: End Stage Renal Disease    INTERVAL HISTORY:  Dialysis going well thus far.  Losing weight, plan for no UF today.  More oriented today.   -- MRI with chronic cerebellar hemorrhages.   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      DIALYSIS TREATMENT DATA:  Estimated Dry Weight (kg): 49.5 kg (109 lb 2 oz)  Patient Goal Weight (kg): 1 kg (2 lb 3.3 oz)  Dialyzer: F-180 (98 mLs)  Dialysis Bath  Bath: 3 K+ / 2.5 Ca+  Dialysate Na (mEq/L): 137 mEq/L  Dialysate HCO3 (mEq/L): 35 mEq/L  Dialysate Total Buffer HCO3 (mEq/L): 35 mEq/L  Blood Flow Rate (mL/min): 400 mL/min  Dialysis Flow (mL/min): 800 mL/min    PHYSICAL EXAM:  Vitals:  Temp:  [36.4 ??C (97.5 ??F)-36.8 ??C (98.2 ??F)] 36.6 ??C (97.9 ??F)  Heart Rate:  [66-84] 66  BP: (141-191)/(52-78) 168/68  MAP (mmHg):  [77-110] 110  Weights:  Pre-Treatment Weight (kg): 48.5 kg (106 lb 14.8 oz)    General: Appearing in no acute distress  Pulmonary: clear to auscultation  Cardiovascular: regular rate and rhythm  Extremities: no significant  edema  Access: LUE AV graft    LAB DATA:  Lab Results   Component Value Date    NA 138 02/16/2021    K 3.7 02/16/2021    CL 102 02/16/2021    CO2 25.0 02/16/2021    BUN 25 (H) 02/16/2021    CREATININE 5.85 (H) 02/16/2021      Lab Results   Component Value Date    HCT 28.6 (L) 02/16/2021    WBC 11.7 (H) 02/16/2021        ASSESSMENT/PLAN:  End Stage Renal Disease on Intermittent Hemodialysis:  - UF goal: 0L as tolerated  - Adjust medications for a GFR <10  - Avoid nephrotoxic agents     Bone Mineral Metabolism:  Lab Results   Component Value Date    CALCIUM 9.0 02/16/2021    CALCIUM 8.5 (L) 02/15/2021    Lab Results   Component Value Date ALBUMIN 2.6 (L) 02/14/2021    ALBUMIN 3.1 (L) 02/13/2021      Lab Results   Component Value Date    PHOS 5.0 02/16/2021    PHOS 3.0 02/15/2021    No results found for: PTH   - Continue phosphorus binder and dietary counseling.    Anemia:   Lab Results   Component Value Date    HGB 9.4 (L) 02/16/2021    HGB 8.8 (L) 02/15/2021    HGB 8.7 (L) 02/14/2021    Iron Saturation (%)   Date Value Ref Range Status   02/13/2021 21 % Final      Lab Results   Component Value Date    FERRITIN 2,051.1 (H) 02/13/2021       Continue intravenous Epogen/Retacrit 1K units with each treatment.       Tonye Royalty, MD  [place] Division of Nephrology & Hypertension

## 2021-02-16 NOTE — Unmapped (Signed)
NEW IMMUNOCOMPROMISED HOST INFECTIOUS DISEASE CONSULT NOTE      Cristina Fields is being seen in consultation at the request of Guerry Bruin, * for evaluation of S.bovis bacteremia and alter mental status    Assessment/Recommendations:    Cristina Fields is a 70 y.o. female    ID Problem List:  CLL Dx 2012; relapsed in 05/2014, 2018, 04/2020  - Prior chemotherapy: fludarabine, cyclophos, ritux, bendamustine, ibrutinib, chlorambucil  - Current chemotherapy: zanubrutinib - on hold since admission  - Infection Prophylaxis: none    Infection History  # S.Bovis bacteremia 02/13/21  -4/25 BCx (PIV) S.bovis x 1 set  -4/27 BCx pending    # Encephalopathy favor toxic metabolic 02/13/21, improved  -4/27 MRI brain chronic cerebellar hemorrhages, no acute intracranial hemorrhage    Pertinent Co-morbidities  T2DM (A1c 5.6; 01/06/21)  ESRD on iHD  Rectal condyloma acuminata with HSIL/AIN3 01/30/21  Cognitive impairment SLUMS score of 15/30 c/t dementia 01/29/21    Drug Intolerances  none     RECOMMENDATIONS FOR 02/16/2021    Diagnostic  ??? F/u BCx 4/27, 4/28  ??? F/u TTE  ??? Given her mental status has improved, defer LP for now  ??? Require outpatient colonoscopy to rule out GI malignancy I/s/o S.bovis BSI  ??? We requested suscept of S.bovis growing on 4/25  ??? Monitor for toxicity of parenteral antibiotics. Recommend following CBC w/diff, CMP at least weekly while on IV therapy    Treatment  ??? Continue ceftriaxone 2g q24h. If S.bovis is sens to penicillin, will transition to Pen G IV.          The [facility] ID service will continue to follow.  Please page the ID Transplant/Liquid Oncology Fellow consult at [phone] with questions.  Patient discussed with Dr. Raylene Miyamoto    Cristina Mihok [name], MD  Fellow, [facility] Infectious Diseases      History of Present Illness:      Source of information includes:  Electronic Medical Records.  History obtained from:patient.    70 years old female with past medical history of recent diagnosis of ESRD on dialysis, type 2 diabetes mellitus (currently not on medications), relapsed CLL on zanubrutinib, and recent diagnosis of anal condyloma acuminata who presented from infusion clinic with altered mental status.    Patient was recently admitted on 4/7-4/12 at [facility] for free via rectal pain in the setting of a new diagnosis of anal condyloma acuminata.  After discharge, she was doing well.  She went to oncology infusion clinic on 4/25 for lab check and was found to have emotionally labile, complaining of diffuse pain and disorientation.    Upon admission, the differential of her and cephalopathy was brought including uremia, in infection, medications induced, stroke, viral encephalitis.  Her home zanabrutinib is being held.  MRI brain showed no acute intracranial hemorrhage, however revealed chronic cerebellar hemorrhage which which seem to be stable to prior MRI done on 08/2020.  Neurosurgery seen and recommended no intervention given negative acute intracranial process on MRI.    With regards to her extensive anal condyloma acuminata, she has been followed at the [place].  General surgery was consulted who recommended continuing daily dressing changes and defer MRI at this moment.    However, blood culture drawn on 4/25 growing Streptococcus bovis.  Therefore, ID is consulted for assistance in management of bacteremia.    Upon encounter today, she is alert and oriented x3.  She was able to communicate but took some time to  think of her answers.  She denies headaches, neck pain, abdominal pain, chest pain, cough, shortness of breath, sore throat, nausea vomiting, diarrhea, dysuria.  She reports ongoing mild rectal pain.    Allergies:  Allergies   Allergen Reactions   ??? Lisinopril Cough and Other (See Comments)     Other reaction(s): Unknown       Medications:   Antimicrobials:  Anti-infectives (From admission, onward)    Start     Dose/Rate Route Frequency Ordered Stop    02/15/21 1100  cefTRIAXone (ROCEPHIN) 2 g in sodium chloride 0.9 % (NS) 100 mL IVPB-connector bag         2 g  200 mL/hr over 30 Minutes Intravenous Every 12 hours 02/15/21 1037 02/22/21 1059          Current/Prior immunomodulators:  zanubrutinib - on hold now    Other medications reviewed.     Medical History:  Past Medical History:   Diagnosis Date   ??? CLL (chronic lymphocytic leukemia) (CMS-HCC)     relapsed   ??? CVA (cerebral vascular accident) (CMS-HCC)    ??? Diabetes (CMS-HCC)    ??? ESRD (end stage renal disease) on dialysis (CMS-HCC)    ??? HTN (hypertension)    ??? Palliative care patient        Surgical History:  Past Surgical History:   Procedure Laterality Date   ??? PR SURG DIAGNOSTIC EXAM, ANORECTAL N/A 01/30/2021    Procedure: ANORECTAL EUA with Biopsy;  Surgeon: Estelle June, MD;  Location: MAIN OR [facility];  Service: General Surgery       Social History:  Tobacco use:   reports that she has never smoked. She has never used smokeless tobacco.   Alcohol use:    reports previous alcohol use.   Drug use:    reports no history of drug use.   Living situation:  Lives with spouse/partner   Residence:   [place] town   Birth place  [place]. Moved to  8 yrs ago   [place] travel:   No [place] travel outside of [place]. 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 Diabetes Mother    ??? Hyperlipidemia Mother    ??? Diabetes Father    ??? Hyperlipidemia Father        Review of Systems:  All other systems reviewed are negative.          Vital Signs last 24 hours:  Temp:  [36.4 ??C (97.5 ??F)-36.8 ??C (98.2 ??F)] 36.6 ??C (97.9 ??F)  Heart Rate:  [68-84] 84  Resp:  [16-17] 17  BP: (141-182)/(52-69) 182/69  MAP (mmHg):  [77-102] 102  SpO2:  [93 %-100 %] 100 %    Physical Exam:  Patient Lines/Drains/Airways Status     Active Active Lines, Drains, & Airways     Name Placement date Placement time Site Days    Peripheral  IV 02/13/21 Posterior;Right Hand 02/13/21  1420  Hand  2    Hemodialysis Catheter  Right Subclavian ???  ???  Subclavian      Arteriovenous Fistula - Vein Graft  Access Arteriovenous vein graft Left;Upper Arm ???  ???  Arm                Const: lying in bed comfortably, not in acute distress, alert and awake  Eyes: sclerae anicteric and non injected  ENT: no dental carries, no oral thrust, no ulcer  Lymph: no cervical or supraclavicular LAD  CV: RRR, no abnormal heart sounds noted and no peripheral edema  Resp: normal work of breathing at rest and CTAB anteriorly  GI: soft, NTND  GU: deferred  Skin: no petechiae, ecchymoses or obvious rashes on clothed exam  MSK: no swollen joints  Neuro: alert and oriented x3, word finding difficulties but able to figure after taking some time, no tremor noted, facial expression symmetric and moves extremities   Psych: calm, appropriate eye contact      Data for Medical Decision Making     Recent Labs   Lab Units 02/16/21  0658 02/15/21  0600 02/14/21  0523 02/13/21  1747 02/13/21  1418   WBC 10*9/L 11.7* 11.6* 13.7*  --  13.6*   HEMOGLOBIN g/dL 9.4* 8.8* 8.7*  --  16.1*   PLATELET COUNT (1) 10*9/L 62* 49* 54*  --  61*   NEUTRO ABS 10*9/L  --  2.1 2.2  --  2.9   LYMPHO ABS 10*9/L  --  9.0*  --   --  10.1*   EOSINO ABS 10*9/L  --  0.2  --   --  0.2   BUN mg/dL 25* 16 41* 33*  --    CREATININE mg/dL 0.96* 0.45* 4.09* 8.11*  --    AST U/L  --   --  21 25  --    ALT U/L  --   --  <7* 8*  --    BILIRUBIN TOTAL mg/dL  --   --  0.2* 0.3  --    ALK PHOS U/L  --   --  74 85  --    POTASSIUM mmol/L 3.7 3.6 3.8 4.0  --    MAGNESIUM mg/dL 1.8 1.6 1.8 2.1  --    PHOSPHORUS mg/dL 5.0 3.0 4.3  --   --    CALCIUM mg/dL 9.0 8.5* 8.6* 9.2  --        New Culture Data      Microbiology Results (last day)     Procedure Component Value Date/Time Date/Time    Blood Culture, Adult [[id number]]  (Normal) Collected: 02/13/21 2207    Lab Status: Preliminary result Specimen: Blood from 1 Peripheral Draw Updated: 02/15/21 2215     Blood Culture, Routine No Growth at 48 hours    Blood Culture #1 [[id number]] Collected: 02/15/21 1314    Lab Status: In process Specimen: Blood from 1 Peripheral Draw Updated: 02/15/21 1340    Blood Culture #2 [[id number]] Collected: 02/15/21 1314    Lab Status: In process Specimen: Blood from 1 Peripheral Draw Updated: 02/15/21 1340    Blood Culture #1 [[id number]]  (Abnormal) Collected: 02/13/21 1746    Lab Status: Preliminary result Specimen: Blood from 1 Peripheral Draw Updated: 02/15/21 1159     Blood Culture, Routine Streptococcus bovis group     Comment: Susceptibility Testing By Consultation Only        Gram Stain Result Gram  positive cocci in chains             Recent Studies      XR Clavicle Left    Result Date: 02/15/2021  EXAM: XR CLAVICLE LEFT DATE: 02/15/2021 1:41 PM ACCESSION: [id number] UN DICTATED: 02/15/2021 1:52 PM INTERPRETATION LOCATION: [facility] CLINICAL INDICATION: 70 years old Female with Pain over clavicle  COMPARISON: None. TECHNIQUE: AP and axial views of the left clavicle. FINDINGS: No acute fracture. The sternoclavicular and acromioclavicular joints are approximated. Moderate acromioclavicular joint space narrowing with osteophytosis. No soft tissue swelling. The partially evaluated lungs are clear.     Moderate left acromioclavicular osteoarthrosis.    MRA Head Wo Contrast    Result Date: 02/15/2021  EXAM: Magnetic resonance angiography, head, without contrast material. DATE: 02/15/2021 9:07 AM ACCESSION: [id number] UN DICTATED: 02/15/2021 9:21 AM INTERPRETATION LOCATION: [facility] CLINICAL INDICATION: 70 years old Female with Evaluate for intracranial aneurysm  COMPARISON: 09/15/20 TECHNIQUE: Non-contrast MRA of the intracranial arterial circulation in the head was performed using 3D Time of Flight MRA FINDINGS: The visualized portions of the intracranial internal carotid, vertebral and basilar arteries are unremarkable. There is no high grade stenosis. No aneurysm visualized.     No aneurysm identified.        Serologies:  Lab Results   Component Value Date    RPR Nonreactive 02/13/2021       Immunizations:  Immunization History   Administered Date(s) Administered   ??? COVID-19 VACC,MRNA,(PFIZER)(PF)(IM) 12/09/2019, 12/31/2019, 12/27/2020   ??? Hepatitis B Vaccine, Unspecified Formulation 09/22/2019, 11/04/2019, 11/23/2019, 03/28/2020   ??? Hepatitis B, Adult 09/22/2019, 11/04/2019, 11/23/2019, 04/07/2020   ??? INFLUENZA TIV (TRI) 25MO+ W/ PRESERV (IM) 07/23/2019   ??? Influenza Vaccine Quad (IIV4 PF) 35mo+ injectable 07/19/2015   ??? Influenza Virus Vaccine, unspecified formulation 07/22/2017, 07/22/2018, 07/23/2019, 07/23/2019   ??? Pneumococcal Conjugate 13-Valent 07/19/2015, 07/23/2019   ??? Pneumococcal vaccine, Unspecified Formulation 07/23/2019   ??? Pneumococcal, Unspecified Formulation 07/23/2019

## 2021-02-16 NOTE — Unmapped (Cosign Needed)
MRA reviewed and negative for aneurysm. No neurosurgical follow up needed. Continue care per oncology

## 2021-02-16 NOTE — Unmapped (Signed)
Cristina Fields is alert and oriented x3-4, with delayed responses and ongoing confusion throughout shift. No acute changes to orientation or neuro exam. Afebrile with stable VS. Pt with c/o neck/back pain. or nausea. Pt with active bowel and good urine output. Up ad lib and independent. Protective precautions maintained. Fall precautions and pt safety maintained. Emotional and educational support provided.       Problem: Adult Inpatient Plan of Care  Goal: Plan of Care Review  Outcome: Ongoing - Unchanged  Goal: Patient-Specific Goal (Individualized)  Outcome: Ongoing - Unchanged  Goal: Absence of Hospital-Acquired Illness or Injury  Outcome: Ongoing - Unchanged  Intervention: Identify and Manage Fall Risk  Recent Flowsheet Documentation  Taken 02/15/2021 0710 by Fatima Sanger, RN  Safety Interventions: fall reduction program maintained  Goal: Optimal Comfort and Wellbeing  Outcome: Ongoing - Unchanged  Goal: Readiness for Transition of Care  Outcome: Ongoing - Unchanged  Goal: Rounds/Family Conference  Outcome: Ongoing - Unchanged     Problem: Self-Care Deficit  Goal: Improved Ability to Complete Activities of Daily Living  Outcome: Ongoing - Unchanged     Problem: Fall Injury Risk  Goal: Absence of Fall and Fall-Related Injury  Outcome: Ongoing - Unchanged  Intervention: Promote Injury-Free Environment  Recent Flowsheet Documentation  Taken 02/15/2021 0710 by Fatima Sanger, RN  Safety Interventions: fall reduction program maintained

## 2021-02-16 NOTE — Unmapped (Signed)
Oncology Massage Therapy Note    Name:  Cristina Fields  MRN:  [id number]  Date:  02/16/2021    Problem List:  Active Hospital Problems    *Encephalopathy acute      Condyloma      Anal lesion      Thrombocytopenia (CMS-HCC)      CKD (chronic kidney disease) requiring chronic dialysis (CMS-HCC)      CLL (chronic lymphoid leukemia) in relapse (CMS-HCC)        Current Oncology Treatment: Chemotherapy    Sensory Impairment: Other    Pressure Restrictions: Yes Pressure Restriction Types: Low Platelets, Fatigue and Other:    Site Restrictions: Types of Site Restriction: IV Site    Position Restrictions: None    Personal Protective Equipment Used: Mask and Gloves    Subjective: Nurse requested massage therapy for Patient and Pt was receptive. C/o shoulder/neck pain.      Objective: Pt reported pain but did not quantify. Pt was calm during session. Receiving infusion. Scattered bruising on LUE, and tender to touch- avoided massage to upper arm.      Action: Pt was agreeable to relaxation music. Semi-reclining position: Applied gentle massage to posterior neck, upper back, shoulders, arms, hands and feet. 30 minute session using massage cream.      Progress: Pt fell asleep during massage, appeared to be resting comfortably. Recommend gentle massage therapy during hospitalization for comfort, will continue to follow.     Thank you for the opportunity to participate in the care of this patient.   Nichelle Renwick Elayne Snare, LMBT

## 2021-02-16 NOTE — Unmapped (Signed)
Hematology Resident (MEDE) Progress Note    Assessment & Plan:   Cristina Fields is a 70 y.o. female with CLL, ESRD on iHD, hypertension, prior cerebellar infarct that presented to [facility] as a direct admission from infusion clinic with disorientation and pain. Found to have Strep Bovis bacteremia.     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 *    Strep Bovis Bacteremia (1/2 blood cx positive on 4/25): 1/2 Blood Cx positive for Strep Bovis sp. Given immunocompromised state, will treat as true bacteremia.   -CTX 2g daily (4/27- , anticipate 2 week course at least)  -f/u repeat blood cultures on 4/27; repeat culture q48h until clear (next due on 4/29 if not cleared)  -f/u TTE   -ICID consulted, appreciate assistance  -Reports colonoscopy performed 1-2 years ago at [facility] - will try to get records.      Grade 3 Encephalopathy: Suspect ultimately multifactorial in the setting of bacteremia per above, toxic-metabolic/uremia, and medication/pain driven superimposed on cognitive impairment at baseline (SLUMS 15 as outpatient). CT head on admission with  Brain MRI on 4/26 initially read with concern for [facility] prompting NSGY consult who recommended LP. Subsequent over-read of MRI by attending however suggests against a SAH. MRA without aneurysm.   -f/u Blood cultures  -Hold on performing LP for now given over-read of MRI. Can consider if her encephalopathy worsens, has been consistently improving albeit slowly. (of note Zanabrutinib causes platelet inhibition and needs to be held before an LP given risk of bleeding).     #Left clavicle, shoulder, neck pain: Seems musculoskeletal in nature, Clavicle xray neg for fracture.   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 MRI pelvis with contrast was ordered outpatient during last admission as recommended by colorectal surgery. However, there is risk for NSF with gadolinium. Examined with RN. Wound is raw and she is having significant pain from it, however drainage is clear and non-purulent. Given leukocytosis and altered mental status, will treat with empiric antibiotics.   - Follow-up blood cultures.   - Colorectal Surgery consulted - no indication for MRI imaging at this time.  - Wound Care consulted  - Pain control with scheduled tylenol, PRN oxycodone 2.5-5mg  q4h.     #CLL - Worsening Leukocytosis with lymphocyte predominance:  Oncology history:  Follows with Dr. Lonni Fix. Diagnosed ~2012 and received fludarabine + cyclophosphamide + rituximab x6. Relapsed in 05/2014 treated with bendamustine + rituximab. Relapsed again in 2018 and started ibrutinib in 5/201. Rapid relapse in 04/2020 and was treated with rituximab and chlorambucil through November, 2021. On zanubrutinib prior to admission.   - HOLD zanubrutinib given possible need for LP and plt inhibition effects  - BMBx planned outpatient. Consider performing this while she is hospitalized.     #Anemia - Thrombocytopenia:  - Retic count 2.02; Iron 35, Iron Sat 21%, Ferritin 2,051; Vitamin B12 >2000; Folate wnl.  -Trend CBC daily  -Transfuse for hgb <7, platelets <10,000, higher if actively bleeding    ESRD on iHD - HTN:   -iHD while inpatient  -Appreciate Nephrology assistance  -Coreg to 25 mg  BID  -Continue Nifedipine XL 60mg  daily; Hydralazine 10mg  q8h; Losartan 100mg  daily.  -Hold home Lasix 80mg  daily    Chronic Problems:  History of CVA  Hospitalized for acute cerebellar infarct in 03/2020.   - Continue atorvastatin   - Not on aspirin while on zanubrutinib for thrombocytopenia   ??  T2DM  No home medications. Hgb A1c 5.8 in 12/2020.   - Trend glucose on BMP  ??  MDD  - Continue sertraline 100 mg daily     Daily Checklist:  Diet: Regular Diet  DVT PPx: SCDs, holding heparin given thrombocytopenia and bleeding risk.   Electrolytes: Replete Potassium to >/= 3.6 and Magnesium to >/= 1.8  Code Status: Full Code  Dispo: Admit to 4 ONC    Team Contact Information:   Primary Team: Hematology Resident (MEDE)  Primary Resident: Coralee Pesa, MD  Resident's Pager: [phone] (Hematology Intern - Cliffton Asters)    Interval History:   Still alert and oriented only to self and year. Unable to state where we are this morning. Pain over her condyloma and wound. Otherwise no complaints.     ROS otherwise negative.     Objective:   Temp:  [36.4 ??C-36.8 ??C] 36.6 ??C  Heart Rate:  [68-84] 84  Resp:  [16-17] 17  BP: (141-182)/(52-69) 182/69  SpO2:  [93 %-100 %] 100 %    Gen: Older woman, lying in bed, more alert than yesterday's exam, alert to person, year, but not place. Reports being here due to stress when asked why she is in the hospital.   Eyes: sclera anicteric, EOMI  Heart: RRR, normal S1/S2, no obvious murmurs appreciated  Lungs: Normal WOB on RA, CTAB in anterior fields.   Abdomen: Normoactive bowel sounds, soft, NTND, no rebound/guarding  Extremities: no clubbing, cyanosis, or edema in the BLEs  Neuro: Answers questions, word finding difficulties and echolalia at times. Oriented to self and year, but not to location. Waxing and waning attention. No gross focal deficits. Moves extremities equally.  Skin: Condyloma not examined today as NA about to clean patient after stool incontinence.     Labs/Studies: Labs and Studies from the last 24hrs per EMR and Reviewed

## 2021-02-17 DIAGNOSIS — N186 End stage renal disease: Secondary | ICD-10-CM | POA: Diagnosis not present

## 2021-02-17 DIAGNOSIS — G9340 Encephalopathy, unspecified: Secondary | ICD-10-CM | POA: Diagnosis not present

## 2021-02-17 DIAGNOSIS — I081 Rheumatic disorders of both mitral and tricuspid valves: Secondary | ICD-10-CM | POA: Diagnosis not present

## 2021-02-17 DIAGNOSIS — C9112 Chronic lymphocytic leukemia of B-cell type in relapse: Secondary | ICD-10-CM | POA: Diagnosis not present

## 2021-02-17 DIAGNOSIS — Z992 Dependence on renal dialysis: Secondary | ICD-10-CM | POA: Diagnosis not present

## 2021-02-17 DIAGNOSIS — B954 Other streptococcus as the cause of diseases classified elsewhere: Secondary | ICD-10-CM | POA: Diagnosis not present

## 2021-02-17 DIAGNOSIS — R7881 Bacteremia: Secondary | ICD-10-CM | POA: Diagnosis not present

## 2021-02-17 LAB — HEPATIC FUNCTION PANEL
ALBUMIN: 2.9 g/dL — ABNORMAL LOW (ref 3.4–5.0)
ALKALINE PHOSPHATASE: 99 U/L (ref 46–116)
ALT (SGPT): 7 U/L — ABNORMAL LOW (ref 10–49)
AST (SGOT): 26 U/L (ref <=34)
BILIRUBIN DIRECT: 0.2 mg/dL (ref 0.00–0.30)
BILIRUBIN TOTAL: 0.3 mg/dL (ref 0.3–1.2)
PROTEIN TOTAL: 4.9 g/dL — ABNORMAL LOW (ref 5.7–8.2)

## 2021-02-17 LAB — CBC W/ AUTO DIFF
BASOPHILS ABSOLUTE COUNT: 0.2 10*9/L — ABNORMAL HIGH (ref 0.0–0.1)
BASOPHILS RELATIVE PERCENT: 1.3 %
EOSINOPHILS ABSOLUTE COUNT: 0.2 10*9/L (ref 0.0–0.5)
EOSINOPHILS RELATIVE PERCENT: 1.6 %
HEMATOCRIT: 27.4 % — ABNORMAL LOW (ref 34.0–44.0)
HEMOGLOBIN: 9.1 g/dL — ABNORMAL LOW (ref 11.3–14.9)
LYMPHOCYTES ABSOLUTE COUNT: 10.2 10*9/L — ABNORMAL HIGH (ref 1.1–3.6)
LYMPHOCYTES RELATIVE PERCENT: 77.5 %
MEAN CORPUSCULAR HEMOGLOBIN CONC: 33.3 g/dL (ref 32.0–36.0)
MEAN CORPUSCULAR HEMOGLOBIN: 27.5 pg (ref 25.9–32.4)
MEAN CORPUSCULAR VOLUME: 82.5 fL (ref 77.6–95.7)
MEAN PLATELET VOLUME: 10.5 fL (ref 6.8–10.7)
MONOCYTES ABSOLUTE COUNT: 0.3 10*9/L (ref 0.3–0.8)
MONOCYTES RELATIVE PERCENT: 2.1 %
NEUTROPHILS ABSOLUTE COUNT: 2.3 10*9/L (ref 1.8–7.8)
NEUTROPHILS RELATIVE PERCENT: 17.5 %
PLATELET COUNT: 61 10*9/L — ABNORMAL LOW (ref 150–450)
RED BLOOD CELL COUNT: 3.33 10*12/L — ABNORMAL LOW (ref 3.95–5.13)
RED CELL DISTRIBUTION WIDTH: 16.6 % — ABNORMAL HIGH (ref 12.2–15.2)
WBC ADJUSTED: 13.2 10*9/L — ABNORMAL HIGH (ref 3.6–11.2)

## 2021-02-17 LAB — BASIC METABOLIC PANEL
ANION GAP: 9 mmol/L (ref 5–14)
BLOOD UREA NITROGEN: 13 mg/dL (ref 9–23)
BUN / CREAT RATIO: 4
CALCIUM: 8.5 mg/dL — ABNORMAL LOW (ref 8.7–10.4)
CHLORIDE: 99 mmol/L (ref 98–107)
CO2: 29 mmol/L (ref 20.0–31.0)
CREATININE: 2.93 mg/dL — ABNORMAL HIGH
EGFR CKD-EPI AA FEMALE: 18 mL/min/{1.73_m2} — ABNORMAL LOW (ref >=60)
EGFR CKD-EPI NON-AA FEMALE: 16 mL/min/{1.73_m2} — ABNORMAL LOW (ref >=60)
GLUCOSE RANDOM: 91 mg/dL (ref 70–179)
POTASSIUM: 3.5 mmol/L (ref 3.4–4.8)
SODIUM: 137 mmol/L (ref 135–145)

## 2021-02-17 LAB — PHOSPHORUS: PHOSPHORUS: 3 mg/dL (ref 2.4–5.1)

## 2021-02-17 LAB — MAGNESIUM: MAGNESIUM: 1.6 mg/dL (ref 1.6–2.6)

## 2021-02-17 MED ADMIN — hydrALAZINE (APRESOLINE) tablet 10 mg: 10 mg | ORAL | @ 19:00:00

## 2021-02-17 MED ADMIN — acetaminophen (TYLENOL) tablet 1,000 mg: 1000 mg | ORAL | @ 19:00:00

## 2021-02-17 MED ADMIN — hydrALAZINE (APRESOLINE) tablet 10 mg: 10 mg | ORAL | @ 10:00:00

## 2021-02-17 MED ADMIN — acetaminophen (TYLENOL) tablet 1,000 mg: 1000 mg | ORAL | @ 10:00:00

## 2021-02-17 MED ADMIN — sertraline (ZOLOFT) tablet 100 mg: 100 mg | ORAL | @ 02:00:00

## 2021-02-17 MED ADMIN — acetaminophen (TYLENOL) tablet 1,000 mg: 1000 mg | ORAL | @ 02:00:00

## 2021-02-17 MED ADMIN — oxyCODONE (ROXICODONE) immediate release tablet 2.5 mg: 2.5 mg | ORAL | @ 20:00:00 | Stop: 2021-02-27

## 2021-02-17 MED ADMIN — magnesium sulfate 2gm/50mL IVPB: 2 g | INTRAVENOUS | @ 14:00:00 | Stop: 2021-02-17

## 2021-02-17 MED ADMIN — carvediloL (COREG) tablet 25 mg: 25 mg | ORAL | @ 02:00:00

## 2021-02-17 MED ADMIN — furosemide (LASIX) tablet 80 mg: 80 mg | ORAL | @ 14:00:00

## 2021-02-17 MED ADMIN — diclofenac sodium (VOLTAREN) 1 % gel 2 g: 2 g | TOPICAL | @ 02:00:00

## 2021-02-17 MED ADMIN — hydrALAZINE (APRESOLINE) tablet 10 mg: 10 mg | ORAL | @ 02:00:00

## 2021-02-17 MED ADMIN — carvediloL (COREG) tablet 25 mg: 25 mg | ORAL | @ 12:00:00

## 2021-02-17 MED ADMIN — lidocaine (LIDODERM) 5 % patch 2 patch: 2 | TRANSDERMAL | @ 02:00:00

## 2021-02-17 MED ADMIN — diclofenac sodium (VOLTAREN) 1 % gel 2 g: 2 g | TOPICAL | @ 16:00:00

## 2021-02-17 MED ADMIN — diclofenac sodium (VOLTAREN) 1 % gel 2 g: 2 g | TOPICAL | @ 23:00:00

## 2021-02-17 MED ADMIN — cefTRIAXone (ROCEPHIN) 2 g in sodium chloride 0.9 % (NS) 100 mL IVPB-connector bag: 2 g | INTRAVENOUS | @ 16:00:00 | Stop: 2021-02-23

## 2021-02-17 MED ADMIN — atorvastatin (LIPITOR) tablet 40 mg: 40 mg | ORAL | @ 12:00:00

## 2021-02-17 MED ADMIN — losartan (COZAAR) tablet 100 mg: 100 mg | ORAL | @ 12:00:00

## 2021-02-17 MED ADMIN — NIFEdipine (PROCARDIA XL) 24 hr tablet 60 mg: 60 mg | ORAL | @ 12:00:00

## 2021-02-17 NOTE — Unmapped (Signed)
HEMODIALYSIS NURSE PROCEDURE NOTE       Treatment Number:  2 Room / Station:  2    Procedure Date:  02/16/21 Device Name/Number: Emmitt    Total Dialysis Treatment Time:  228 Min.    CONSENT:    Written consent was obtained prior to the procedure and is detailed in the medical record.  Prior to the start of the procedure, a time out was taken and the identity of the patient was confirmed via name, medical record number and date of birth.     WEIGHT:  Hemodialysis Pre-Treatment Weights     Date/Time Pre-Treatment Weight (kg) Estimated Dry Weight (kg) Patient Goal Weight (kg) Total Goal Weight (kg)    02/16/21 1416 48.5 kg (106 lb 14.8 oz) 49.5 kg (109 lb 2 oz) 1 kg (2 lb 3.3 oz) 1.55 kg (3 lb 6.7 oz)         Hemodialysis Post Treatment Weights     Date/Time Post-Treatment Weight (kg) Treatment Weight Change (kg)    02/16/21 1830 ???  too unsteady to stand ???        Active Dialysis Orders (168h ago, onward)     Start     Ordered    02/16/21 0700  Hemodialysis inpatient  Every Tue,Thu,Sat      Comments: RIJ IS NOT A DIALYSIS ACCESS   Question Answer Comment   K+ 3 meq/L    Ca++ 2.5 meq/L    Bicarb 35 meq/L    Na+ 137 meq/L    Na+ Modeling no    Dialyzer [id number]    Dialysate Temperature (C) 37    BFR-As tolerated to a maximum of: 500 mL/min    DFR 800 mL/min    Duration of treatment 3.5 Hr    Dry weight (kg) 49.5 kg    Challenge dry weight (kg) no    Fluid removal (L) to edw    Tubing Adult = 142 ml    Access Site AVG    Access Site Location Left    Keep SBP >: 95        02/14/21 1040              ASSESSMENT:  General appearance: appears older than stated age, cooperative, distracted, fatigued and mild distress  Neurologic: some mild disorientation to time; slow to answer, but answers questions appropriately  Lungs: diminished breath sounds bilaterally  Heart: regular rate and rhythm  Abdomen: abnormal findings:  guarding and tender; RN Zoe L notified    ACCESS SITE:       Hemodialysis Catheter  Right Subclavian (Active) Site Assessment Clean;Dry;Intact 02/16/21 0200   Proximal Lumen Status / Patency Capped 02/16/21 0200   Proximal Lumen Intervention Clamped 02/16/21 0200   Medial Lumen Status / Patency Capped 02/16/21 0200   Medial Lumen Intervention Clamped 02/16/21 0200   Dressing Intervention No intervention needed 02/16/21 0200   Dressing Status      Clean;Dry;Intact/not removed 02/16/21 0200   Site Condition No complications 02/16/21 0200   Dressing Type CHG gel;Occlusive;Transparent 02/16/21 0200        Arteriovenous Fistula - Vein Graft  Access Arteriovenous vein graft Left;Upper Arm (Active)   Site Assessment Clean;Dry;Intact 02/16/21 1430   AV Fistula Thrill Present;Bruit Present 02/16/21 1430   Status Accessed 02/16/21 1430   Dressing Intervention No intervention needed 02/16/21 1430   Dressing Status      No dressing 02/16/21 1430   Site Condition No complications 02/16/21 1430   Dressing  Gauze 02/14/21 1815   Dressing To Be Removed (Date/Time) 4-6 hours post HD 02/14/21 1815     Catheter fill volumes:    Arterial: 2.1 mL Venous: 2.1 mL   Catheter filled with 1 mg Gentamicin Citrate post procedure.  Catheter accessed to obtain blood cultures only.  LUE AVG used for iHD tx.     Patient Lines/Drains/Airways Status     Active Peripheral & Central Intravenous Access     Name Placement date Placement time Site Days    Peripheral IV 02/13/21 Posterior;Right Hand 02/13/21  1420  Hand  3               LAB RESULTS:  Lab Results   Component Value Date    NA 138 02/16/2021    K 3.7 02/16/2021    CL 102 02/16/2021    CO2 25.0 02/16/2021    BUN 25 (H) 02/16/2021    CREATININE 5.85 (H) 02/16/2021    GLU 142 02/16/2021    CALCIUM 9.0 02/16/2021    PHOS 5.0 02/16/2021    MG 1.8 02/16/2021    IRON 35 (L) 02/13/2021    LABIRON 21 02/13/2021    FERRITIN 2,051.1 (H) 02/13/2021    TIBC 163 (L) 02/13/2021     Lab Results   Component Value Date    WBC 11.7 (H) 02/16/2021    HGB 9.4 (L) 02/16/2021    HCT 28.6 (L) 02/16/2021    PLT 62 (L) 02/16/2021    APTT 33.0 02/15/2021        VITAL SIGNS:     Hemodynamics     Date/Time Pulse BP MAP (mmHg) Arterial Line BP    02/16/21 1830 77 159/66 ??? --    02/16/21 1800 69 165/66 ??? --    02/16/21 1730 66 158/67 ??? --    02/16/21 1700 73 149/66 ??? --    02/16/21 1630 68 149/94 ??? --    02/16/21 1600 69 135/60 ??? --    02/16/21 1530 66 139/59 ??? --    Date/Time Arterial Line MAP Arterial Line BP 2 Arterial Line MAP Patient Position    02/16/21 1830 -- -- -- Lying    02/16/21 1800 -- -- -- Lying    02/16/21 1730 -- -- -- Lying    02/16/21 1700 -- -- -- Lying    02/16/21 1630 -- -- -- Lying    02/16/21 1600 -- -- -- Lying    02/16/21 1530 -- -- -- Lying        Blood Volume Monitor     Date/Time Blood Volume Change (%) HCT HGB Critline O2 SAT %    02/16/21 1830 -10.5 % 30.5 10.4 94    02/16/21 1800 -9.8 % 30.3 10.3 94.6    02/16/21 1730 -9.4 % 30.1 10.2 94.1    02/16/21 1700 -10.1 % 30.4 10.3 95    02/16/21 1630 -8.6 % 29.9 10.2 94.8    02/16/21 1600 -9.2 % 30.1 10.2 94.4    02/16/21 1530 -8.6 % 29.9 10.2 93        Oxygen Therapy     Date/Time Resp SpO2 O2 Device FiO2 (%) O2 Flow Rate (L/min)    02/16/21 1830 18 ??? None (Room air) -- --    02/16/21 1800 18 ??? None (Room air) -- --    02/16/21 1730 18 ??? None (Room air) -- --    02/16/21 1700 18 ??? None (Room air) -- --    02/16/21 1630 18 ???  None (Room air) -- --    02/16/21 1600 17 ??? None (Room air) -- --    02/16/21 1530 18 ??? None (Room air) -- --          Pre-Hemodialysis Assessment     Date/Time Therapy Number Dialyzer Hemodialysis Line Type All Machine Alarms Passed    02/16/21 1416 2 F-180 (98 mLs) Adult (142 m/s) Yes    Date/Time Air Detector Saline Line Double Clampled Hemo-Safe Applied Dialysis Flow (mL/min)    02/16/21 1416 Engaged ??? ??? 800 mL/min    Date/Time Verify Priming Solution Priming Volume Hemodialysis Independent pH Hemodialysis Machine Conductivity (mS/cm)    02/16/21 1416 0.9% NS 300 mL ??? 13.8 mS/cm    Date/Time Hemodialysis Independent Conductivity (mS/cm) Bicarb Conductivity Residual Bleach Negative Total Chlorine    02/16/21 1416 13.6 mS/cm -- Yes 0        Pre-Hemodialysis Treatment Comments     Date/Time Pre-Hemodialysis Comments    02/16/21 1416 alert, VSS        Hemodialysis Treatment     Date/Time Blood Flow Rate (mL/min) Arterial Pressure (mmHg) Venous Pressure (mmHg) Transmembrane Pressure (mmHg)    02/16/21 1830 350 mL/min -124 mmHg 174 mmHg 46 mmHg    02/16/21 1800 350 mL/min -112 mmHg 175 mmHg 49 mmHg    02/16/21 1730 350 mL/min -113 mmHg 181 mmHg 201 mmHg    02/16/21 1700 400 mL/min -155 mmHg 202 mmHg 40 mmHg    02/16/21 1630 400 mL/min -153 mmHg 201 mmHg 39 mmHg    02/16/21 1600 400 mL/min -155 mmHg 202 mmHg 39 mmHg    02/16/21 1530 400 mL/min -152 mmHg 202 mmHg 39 mmHg    02/16/21 1500 400 mL/min -144 mmHg 205 mmHg 46 mmHg    02/16/21 1442 400 mL/min -136 mmHg 198 mmHg 50 mmHg    Date/Time Ultrafiltration Rate (mL/hr) Ultrafiltrate Removed (mL) Dialysate Flow Rate (mL/min) KECN (Kecn)    02/16/21 1830 0 mL/hr 564 mL 500 ml/min ???    02/16/21 1800 160 mL/hr 477 mL 500 ml/min ???    02/16/21 1730 0 mL/hr 406 mL 800 ml/min ???    02/16/21 1700 150 mL/hr 358 mL 800 ml/min ???    02/16/21 1630 150 mL/hr 282 mL 800 ml/min ???    02/16/21 1600 160 mL/hr 207 mL 800 ml/min ???    02/16/21 1530 160 mL/hr 127 mL 800 ml/min ???    02/16/21 1500 160 mL/hr 49 mL 500 ml/min ???    02/16/21 1442 160 mL/hr 2 mL 500 ml/min ???        Hemodialysis Treatment Comments     Date/Time Intra-Hemodialysis Comments    02/16/21 1830 tx completed    02/16/21 1800 pt sleeping    02/16/21 1730 pt sleeping    02/16/21 1700 pt resting quietly, no c/o    02/16/21 1630 pt sleeping    02/16/21 1600 resting quietly    02/16/21 1530 pt sleeping    02/16/21 1500 watching tv    02/16/21 1442 tx started        Post Treatment     Date/Time Rinseback Volume (mL) On Line Clearance: spKt/V Total Liters Processed (L/min) Dialyzer Clearance    02/16/21 1830 300 mL 2.04 spKt/V 78.5 L/min Moderately streaked Post Hemodialysis Treatment Comments     Date/Time Post-Hemodialysis Comments    02/16/21 1830 awake, resting        Hemodialysis I/O     Date/Time Total Hemodialysis Replacement Volume (mL) Total Ultrafiltrate  Output (mL)    02/16/21 1830 ??? 500 mL          [id number] - Medicaitons Given During Treatment  (last 4 hrs)         Jasara Corrigan Wayland Denis, RN       Medication Name Action Time Action Route Rate Dose User     epoetin alfa-EPBX (RETACRIT) injection 1,000 Units 02/16/21 1521 Given Intravenous  1,000 Units Richardson Chiquito, RN     gentamicin-sodium citrate lock solution in NS 02/16/21 1904 Given hemodialysis port injection  2.1 mL Richardson Chiquito, RN     gentamicin-sodium citrate lock solution in NS 02/16/21 1904 Given hemodialysis port injection  2.1 mL Richardson Chiquito, RN     oxyCODONE (ROXICODONE) immediate release tablet 2.5 mg (Or Linked Group #1) 02/16/21 1846 See Alternative Oral   Richardson Chiquito, RN     oxyCODONE (ROXICODONE) immediate release tablet 5 mg (Or Linked Group #1) 02/16/21 1846 Given Oral  5 mg Richardson Chiquito, RN     paricalcitoL [facility]) injection 5 mcg 02/16/21 1522 Given Intravenous  5 mcg Richardson Chiquito, RN          ZOE Annamarie Major, RN       Medication Name Action Time Action Route Rate Dose User     diclofenac sodium (VOLTAREN) 1 % gel 2 g 02/16/21 1600 Not Given Topical  2 g Sheran Spine, RN                  Patient tolerated treatment in a  Bed.

## 2021-02-17 NOTE — Unmapped (Signed)
IMMUNOCOMPROMISED HOST INFECTIOUS DISEASE PROGRESS NOTE    Assessment/Plan:     Cristina Fields is a 70 y.o. female    ID Problem List:  CLL Dx 2012; relapsed in 05/2014, 2018, 04/2020  - Prior chemotherapy: fludarabine, cyclophos, ritux, bendamustine, ibrutinib, chlorambucil  - Current chemotherapy: zanubrutinib - on hold since admission  - Infection Prophylaxis: none     Infection History  # S.Bovis bacteremia 02/13/21  -4/25 BCx (PIV) S.bovis x 1 set  -4/27 BCx BGTD  -4/27 TTE thickening of AV, MV, TV likely degen change, but cannot r/o endocarditis     # Encephalopathy favor toxic metabolic 02/13/21, improved  -4/27 MRI brain chronic cerebellar hemorrhages, no acute intracranial hemorrhage    # Significant L clavicular head, L Lower sternocleidomastoid, L supraspinatus tenderness ~ early 01/2021 c/f myositis? Osteo? Abscess?  - 4/29 exam significant tenderness      Pertinent Co-morbidities  T2DM (A1c 5.6; 01/06/21)  ESRD on iHD  Rectal condyloma acuminata with HSIL/AIN3 01/30/21  Cognitive impairment SLUMS score of 15/30 c/t dementia 01/29/21     Drug Intolerances  none         RECOMMENDATIONS FOR 02/17/2021    Diagnostic  Given equivocal TTE, would obtain TEE as it would help guide duration of therapy   Given stable mental status, okay to defer MRI brain for now  She needs outpatient colonoscopy to rule out GI malignancy I/s/o S.bovis BSI. Noted it may be worthwhile to have at least anoscopy or colposcopy performed while she's in-house as she's definitely at high risk of having colorectal cancer   Imaging to evaluate her L clavicular pain/L sternocleidomastoid pain (would discuss best modality with radiology, ? CT neck with contrast)  We requested suscept of S.bovis growing on 4/25  Monitor for toxicity of parenteral antibiotics. Recommend following CBC w/diff, CMP at least weekly while on IV therapy    Treatment  Continue ceftriaxone 2g q24h. If S.bovis is sens to penicillin, will transition to Pen G IV. Duration TBD - awaiting TEE          The [facility] ID service will continue to follow.  Please page the ID Transplant/Liquid Oncology Fellow consult at [phone] with questions.  Patient discussed with Dr. Raylene Miyamoto    Ashanna Heinsohn [name], MD  Fellow, [facility] Infectious Diseases    Subjective:     Interval History:    History obtained from:patient and family member.  Afebrile. Reports feeling okay. Denies cough, shortness of breath, diarrhea, abdo pain, N/V. However, reports ~3wk h/o L clavicular head/L sternocleidomastoid pain     Medications:  Antimicrobials:  Anti-infectives (From admission, onward)      Start     Dose/Rate Route Frequency Ordered Stop    02/16/21 1100  cefTRIAXone (ROCEPHIN) 2 g in sodium chloride 0.9 % (NS) 100 mL IVPB-connector bag         2 g  200 mL/hr over 30 Minutes Intravenous Every 24 hours 02/16/21 1021 02/23/21 1059            Prior/Current immunomodulators:  zanubrutinib - on hold now    Other medications reviewed.    Objective:     Vital Signs last 24 hours:  Temp:  [36.3 ??C (97.3 ??F)-37.1 ??C (98.8 ??F)] 36.6 ??C (97.9 ??F)  Heart Rate:  [66-82] 72  Resp:  [17-18] 18  BP: (135-191)/(59-94) 165/61  MAP (mmHg):  [92-110] 92  SpO2:  [94 %-97 %] 97 %    Physical Exam:  Patient Lines/Drains/Airways Status  Active Active Lines, Drains, & Airways       Name Placement date Placement time Site Days    Peripheral IV 02/13/21 Posterior;Right Hand 02/13/21  1420  Hand  3    Hemodialysis Catheter  Right Subclavian --  --  Subclavian      Arteriovenous Fistula - Vein Graft  Access Arteriovenous vein graft Left;Upper Arm --  --  Arm                    Const: lying in bed comfortably, not in acute distress, alert and awake  Eyes: sclerae anicteric and non injected  ENT: no dental carries, no oral thrust, no ulcer  CV: RRR, no abnormal heart sounds noted and no peripheral edema.   Resp: normal work of breathing at rest and CTAB anteriorly  GI: soft, NTND  GU: deferred  Skin: no petechiae, ecchymoses or obvious rashes on clothed exam. R chest HD cath c/d/i  MSK: marked tenderness at L clavicular head. Marked tenderness at lower part of L sternocleidomastoid. Marked tenderness at L supraspinatus  Neuro: alert and oriented x3, word finding difficulties but able to figure after taking some time, no tremor noted, facial expression symmetric and moves extremities   Psych: calm, appropriate eye contact    Data for Medical Decision Making     Recent Labs   Lab Units 02/17/21  0445 02/16/21  0658 02/15/21  0600 02/14/21  0523 02/13/21  1747 02/13/21  1418   WBC 10*9/L 13.2* 11.7* 11.6* 13.7*  --  13.6*   HEMOGLOBIN g/dL 9.1* 9.4* 8.8* 8.7*  --  10.1*   PLATELET COUNT (1) 10*9/L 61* 62* 49* 54*  --  61*   NEUTRO ABS 10*9/L 2.3 2.3 2.1 2.2  --  2.9   LYMPHO ABS 10*9/L 10.2* 9.1* 9.0*  --   --  10.1*   EOSINO ABS 10*9/L 0.2 0.1 0.2  --   --  0.2   BUN mg/dL 13 25* 16 41* 33*  --    CREATININE mg/dL 9.56* 2.13* 0.86* 5.78* 8.31*  --    AST U/L 26  --   --  21 25  --    ALT U/L 7*  --   --  <7* 8*  --    BILIRUBIN TOTAL mg/dL 0.3  --   --  0.2* 0.3  --    ALK PHOS U/L 99  --   --  74 85  --    POTASSIUM mmol/L 3.5 3.7 3.6 3.8 4.0  --    MAGNESIUM mg/dL 1.6 1.8 1.6 1.8 2.1  --    PHOSPHORUS mg/dL 3.0 5.0 3.0 4.3  --   --    CALCIUM mg/dL 8.5* 9.0 8.5* 8.6* 9.2  --        New Culture Data      Microbiology Results (last day)       Procedure Component Value Date/Time Date/Time    Blood Culture #1 [[id number]]  (Abnormal) Collected: 02/13/21 1746    Lab Status: Preliminary result Specimen: Blood from 1 Peripheral Draw Updated: 02/17/21 0804     Blood Culture, Routine Streptococcus bovis group     Comment: Susceptibility Testing By Consultation Only  Processed at Physicians Request        Gram Stain Result Gram positive cocci in chains    MADD [[id number]] Collected: 02/15/21 1314    Lab Status: Final result Specimen: Blood from 1 Peripheral Draw Updated: 02/17/21 0800  MADD Reqeust Additional request in process Micro Add-on [[id number]] Collected: 02/13/21 1746    Lab Status: In process Specimen: Blood from 1 Peripheral Draw Updated: 02/17/21 0758    Blood Culture, Adult [[id number]]  (Normal) Collected: 02/13/21 2207    Lab Status: Preliminary result Specimen: Blood from 1 Peripheral Draw Updated: 02/16/21 2215     Blood Culture, Routine No Growth at 72 hours    Blood Culture, Adult [[id number]] Collected: 02/16/21 1905    Lab Status: In process Specimen: Blood from Central Venous Line Updated: 02/16/21 1910    Blood Culture #1 [[id number]]  (Normal) Collected: 02/15/21 1314    Lab Status: Preliminary result Specimen: Blood from 1 Peripheral Draw Updated: 02/16/21 1345     Blood Culture, Routine No Growth at 24 hours    Blood Culture #2 [[id number]]  (Normal) Collected: 02/15/21 1314    Lab Status: Preliminary result Specimen: Blood from 1 Peripheral Draw Updated: 02/16/21 1345     Blood Culture, Routine No Growth at 24 hours               Recent Studies      No results found.

## 2021-02-17 NOTE — Unmapped (Signed)
Hematology Resident (MEDE) Progress Note    Assessment & Plan:   Cristina Fields is a 70 y.o. female with CLL, ESRD on iHD, hypertension, prior cerebellar infarct that presented as a direct admission from infusion clinic with disorientation and pain. Found to have Strep Bovis bacteremia.     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 *    Strep Bovis Bacteremia (1/2 blood cx positive on 4/25): 1/2 Blood Cx positive for Strep Bovis sp. Given immunocompromised state, will treat as true bacteremia.   -CTX 2g daily (4/27- , anticipate 2 week course at least)  -f/u repeat blood cultures on 4/27; repeat culture q48h until clear (next due on 4/29 if not cleared)  -f/u TTE   -ICID consulted, appreciate assistance  -Reports colonoscopy performed 1-2 years ago at [facility] - will try to get records.      Encephalopathy: Previously Grade 3. Now, improved, but not completely at baseline. Worsening likely multifactorial in the setting of bacteremia per above, toxic-metabolic/uremia, and medication/pain driven superimposed on cognitive impairment at baseline (SLUMS 15 as outpatient).   -Treat infection  -Delirium precautions    Left AC OA: Experiencing some neck pain.   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 Seen by lower GI surgery this admission with no surgical needs.  -Wound Care consulted  -Pain control with scheduled tylenol, PRN oxycodone 2.5-5mg  q4h.     CLL - Worsening Leukocytosis with lymphocyte predominance:  Oncology history:  Follows with Dr. Lonni Fix. Diagnosed ~2012 and received fludarabine + cyclophosphamide + rituximab x6. Relapsed in 05/2014 treated with bendamustine + rituximab. Relapsed again in 2018 and started ibrutinib in 5/201. Rapid relapse in 04/2020 and was treated with rituximab and chlorambucil through November, 2021. On zanubrutinib prior to admission.   - HOLD zanubrutinib given possible need for LP and plt inhibition effects  - BMBx planned outpatient. Consider performing this while she is hospitalized.     Anemia - Thrombocytopenia:  -Transfuse for hgb <7, platelets <10,000, higher if actively bleeding    ESRD on iHD - HTN:   -iHD while inpatient  -Coreg to 25 mg BID; Nifedipine XL 60mg  daily; Hydralazine 10mg  q8h; Losartan 100mg  daily  -Lasix 80mg  daily    Daily Checklist:  Diet: Regular Diet  DVT PPx: SCDs, holding heparin given thrombocytopenia and bleeding risk.   Electrolytes: Replete Potassium to >/= 3.6 and Magnesium to >/= 1.8  Code Status: Full Code  Dispo: Admit to 4 ONC    Team Contact Information:   Primary Team: Hematology Resident (MEDE)  Primary Resident: Lyndel Safe, MD  Resident's Pager: [phone] (Hematology Intern - Cliffton Asters)    Interval History:   States that she feels improved today. Endorses that her thinking is more clear. Eating, but reports low appetite. Denies nausea or constipation.    ROS otherwise negative.     Objective:   Temp:  [36.3 ??C-37.1 ??C] 36.8 ??C  Heart Rate:  [66-82] 79  Resp:  [17-18] 18  BP: (135-191)/(59-94) 174/61  SpO2:  [94 %-97 %] 95 %    Constitutional: NAD. Resting in bed.  Respiratory:  Normal work of breathing.  MSK: No LE edema.  Skin: Warm and dry. No rashes.   Neuro: Mild word finding difficulty.    Labs/Studies: Labs and Studies from the last 24hrs per EMR and Reviewed

## 2021-02-17 NOTE — Unmapped (Signed)
Hi,     Cristina Fields contacted the [employer] requesting to speak with the care team of Cristina Fields to discuss:    Patient scheduled for infusion 02/24/21. She will be admitted until 5/4, should this infusion be cancelled?    Thank you,   Kelli Hope  [facility]   [phone]

## 2021-02-17 NOTE — Unmapped (Signed)
Pt resting quietly overnight. VSS, afebrile, Drsg changed X2 due to incontinence of stool.  Pt appetite remains poor, but attemped to try with dinner. Call bell in reach.

## 2021-02-17 NOTE — Unmapped (Signed)
[facility] Triage Note     Patient: Cristina Fields     Reason for call: return call    Time call returned: 1552     Phone Assessment: Spoke with [name].     Triage Recommendations: Informed him that I would reach out to team to see if the labs and transfusion appointment are needed for 5/6 or not and let him know response. Also he stated if it is needed to try to make later in the day. Will message scheduling if this can be done.     Patient Response: grateful     Outstanding tasks: Follow up with patient.     Patient Pharmacy has been verified and primary pharmacy has been marked as preferred

## 2021-02-18 DIAGNOSIS — G9340 Encephalopathy, unspecified: Secondary | ICD-10-CM | POA: Diagnosis not present

## 2021-02-18 DIAGNOSIS — N186 End stage renal disease: Secondary | ICD-10-CM | POA: Diagnosis not present

## 2021-02-18 DIAGNOSIS — Z992 Dependence on renal dialysis: Secondary | ICD-10-CM | POA: Diagnosis not present

## 2021-02-18 DIAGNOSIS — B954 Other streptococcus as the cause of diseases classified elsewhere: Secondary | ICD-10-CM | POA: Diagnosis not present

## 2021-02-18 DIAGNOSIS — R7881 Bacteremia: Secondary | ICD-10-CM | POA: Diagnosis not present

## 2021-02-18 DIAGNOSIS — D696 Thrombocytopenia, unspecified: Secondary | ICD-10-CM | POA: Diagnosis not present

## 2021-02-18 DIAGNOSIS — D631 Anemia in chronic kidney disease: Secondary | ICD-10-CM | POA: Diagnosis not present

## 2021-02-18 DIAGNOSIS — C9112 Chronic lymphocytic leukemia of B-cell type in relapse: Secondary | ICD-10-CM | POA: Diagnosis not present

## 2021-02-18 LAB — BASIC METABOLIC PANEL
ANION GAP: 11 mmol/L (ref 5–14)
BLOOD UREA NITROGEN: 19 mg/dL (ref 9–23)
BUN / CREAT RATIO: 4
CALCIUM: 9.1 mg/dL (ref 8.7–10.4)
CHLORIDE: 98 mmol/L (ref 98–107)
CO2: 27 mmol/L (ref 20.0–31.0)
CREATININE: 4.72 mg/dL — ABNORMAL HIGH
EGFR CKD-EPI AA FEMALE: 10 mL/min/{1.73_m2} — ABNORMAL LOW (ref >=60)
EGFR CKD-EPI NON-AA FEMALE: 9 mL/min/{1.73_m2} — ABNORMAL LOW (ref >=60)
GLUCOSE RANDOM: 132 mg/dL (ref 70–179)
POTASSIUM: 3.5 mmol/L (ref 3.4–4.8)
SODIUM: 136 mmol/L (ref 135–145)

## 2021-02-18 LAB — CBC W/ AUTO DIFF
BASOPHILS ABSOLUTE COUNT: 0.1 10*9/L (ref 0.0–0.1)
BASOPHILS RELATIVE PERCENT: 1.1 %
EOSINOPHILS ABSOLUTE COUNT: 0.4 10*9/L (ref 0.0–0.5)
EOSINOPHILS RELATIVE PERCENT: 3.1 %
HEMATOCRIT: 28.3 % — ABNORMAL LOW (ref 34.0–44.0)
HEMOGLOBIN: 9.4 g/dL — ABNORMAL LOW (ref 11.3–14.9)
LYMPHOCYTES ABSOLUTE COUNT: 8.7 10*9/L — ABNORMAL HIGH (ref 1.1–3.6)
LYMPHOCYTES RELATIVE PERCENT: 71.3 %
MEAN CORPUSCULAR HEMOGLOBIN CONC: 33.1 g/dL (ref 32.0–36.0)
MEAN CORPUSCULAR HEMOGLOBIN: 27.4 pg (ref 25.9–32.4)
MEAN CORPUSCULAR VOLUME: 82.7 fL (ref 77.6–95.7)
MEAN PLATELET VOLUME: 10.1 fL (ref 6.8–10.7)
MONOCYTES ABSOLUTE COUNT: 0.3 10*9/L (ref 0.3–0.8)
MONOCYTES RELATIVE PERCENT: 2.7 %
NEUTROPHILS ABSOLUTE COUNT: 2.6 10*9/L (ref 1.8–7.8)
NEUTROPHILS RELATIVE PERCENT: 21.8 %
PLATELET COUNT: 64 10*9/L — ABNORMAL LOW (ref 150–450)
RED BLOOD CELL COUNT: 3.42 10*12/L — ABNORMAL LOW (ref 3.95–5.13)
RED CELL DISTRIBUTION WIDTH: 17.2 % — ABNORMAL HIGH (ref 12.2–15.2)
WBC ADJUSTED: 12.1 10*9/L — ABNORMAL HIGH (ref 3.6–11.2)

## 2021-02-18 LAB — MAGNESIUM: MAGNESIUM: 2.5 mg/dL (ref 1.6–2.6)

## 2021-02-18 LAB — PHOSPHORUS: PHOSPHORUS: 4.2 mg/dL (ref 2.4–5.1)

## 2021-02-18 MED ADMIN — epoetin alfa-EPBX (RETACRIT) injection 1,000 Units: 1000 [IU] | INTRAVENOUS | @ 12:00:00

## 2021-02-18 MED ADMIN — NIFEdipine (PROCARDIA XL) 24 hr tablet 60 mg: 60 mg | ORAL | @ 17:00:00

## 2021-02-18 MED ADMIN — carvediloL (COREG) tablet 25 mg: 25 mg | ORAL | @ 17:00:00

## 2021-02-18 MED ADMIN — oxyCODONE (ROXICODONE) immediate release tablet 2.5 mg: 2.5 mg | ORAL | @ 19:00:00 | Stop: 2021-02-27

## 2021-02-18 MED ADMIN — carvediloL (COREG) tablet 25 mg: 25 mg | ORAL | @ 02:00:00

## 2021-02-18 MED ADMIN — hydrALAZINE (APRESOLINE) tablet 10 mg: 10 mg | ORAL | @ 02:00:00

## 2021-02-18 MED ADMIN — sertraline (ZOLOFT) tablet 100 mg: 100 mg | ORAL | @ 02:00:00

## 2021-02-18 MED ADMIN — diclofenac sodium (VOLTAREN) 1 % gel 2 g: 2 g | TOPICAL | @ 02:00:00

## 2021-02-18 MED ADMIN — paricalcitoL (ZEMPLAR) injection 5 mcg: 5 ug | INTRAVENOUS | @ 12:00:00

## 2021-02-18 MED ADMIN — heparin (porcine) 1000 unit/mL injection 3,000 Units: 3000 [IU] | INTRAVENOUS | @ 12:00:00

## 2021-02-18 MED ADMIN — hydrALAZINE (APRESOLINE) tablet 10 mg: 10 mg | ORAL | @ 17:00:00 | Stop: 2021-02-18

## 2021-02-18 MED ADMIN — atorvastatin (LIPITOR) tablet 40 mg: 40 mg | ORAL | @ 17:00:00

## 2021-02-18 MED ADMIN — losartan (COZAAR) tablet 100 mg: 100 mg | ORAL | @ 17:00:00

## 2021-02-18 MED ADMIN — acetaminophen (TYLENOL) tablet 1,000 mg: 1000 mg | ORAL | @ 10:00:00

## 2021-02-18 MED ADMIN — diclofenac sodium (VOLTAREN) 1 % gel 2 g: 2 g | TOPICAL | @ 10:00:00

## 2021-02-18 MED ADMIN — cefTRIAXone (ROCEPHIN) 2 g in sodium chloride 0.9 % (NS) 100 mL IVPB-connector bag: 2 g | INTRAVENOUS | @ 17:00:00 | Stop: 2021-02-23

## 2021-02-18 MED ADMIN — melatonin tablet 3 mg: 3 mg | ORAL | @ 02:00:00

## 2021-02-18 MED ADMIN — polyethylene glycol (MIRALAX) packet 17 g: 17 g | ORAL | @ 17:00:00

## 2021-02-18 MED ADMIN — hydrALAZINE (APRESOLINE) tablet 10 mg: 10 mg | ORAL | @ 10:00:00 | Stop: 2021-02-18

## 2021-02-18 MED ADMIN — furosemide (LASIX) tablet 80 mg: 80 mg | ORAL | @ 17:00:00

## 2021-02-18 MED ADMIN — acetaminophen (TYLENOL) tablet 1,000 mg: 1000 mg | ORAL | @ 17:00:00

## 2021-02-18 MED ADMIN — lidocaine (LIDODERM) 5 % patch 2 patch: 2 | TRANSDERMAL | @ 02:00:00

## 2021-02-18 MED ADMIN — acetaminophen (TYLENOL) tablet 1,000 mg: 1000 mg | ORAL | @ 02:00:00

## 2021-02-18 NOTE — Unmapped (Signed)
Pt resting well overnight. Loose BM's continuing.  VSS, afebrile, Q2 turning maintained. Wound care completed by RN. Pt is still showing signs of AMS. Call bell in reach.

## 2021-02-18 NOTE — Unmapped (Signed)
HEMODIALYSIS NURSE PROCEDURE NOTE    Treatment Number:  3 Room/Station:  4 Procedure Date:  02/18/21   Total Treatment Time:  229 Min.    CONSENT:  Written consent was obtained prior to the procedure and is detailed in the medical record. Prior to the start of the procedure, a time out was taken and the identity of the patient was confirmed via name, medical record number and date of birth.     WEIGHTS:  Hemodialysis Pre-Treatment Weights     Date/Time Pre-Treatment Weight (kg) Estimated Dry Weight (kg) Patient Goal Weight (kg) Total Goal Weight (kg)    02/18/21 0712 48.1 kg (106 lb 0.7 oz) 49.5 kg (109 lb 2 oz) 1.4 kg (3 lb 1.4 oz) 1.95 kg (4 lb 4.8 oz)           Hemodialysis Post Treatment Weights     Date/Time Post-Treatment Weight (kg) Treatment Weight Change (kg)    02/18/21 1131 ???  UTW,pt confused ???        Active Dialysis Orders (168h ago, onward)     Start     Ordered    02/16/21 0700  Hemodialysis inpatient  Every Tue,Thu,Sat      Comments: RIJ IS NOT A DIALYSIS ACCESS   Question Answer Comment   K+ 3 meq/L    Ca++ 2.5 meq/L    Bicarb 35 meq/L    Na+ 137 meq/L    Na+ Modeling no    Dialyzer [id number]    Dialysate Temperature (C) 37    BFR-As tolerated to a maximum of: 500 mL/min    DFR 800 mL/min    Duration of treatment 3.5 Hr    Dry weight (kg) 49.5 kg    Challenge dry weight (kg) no    Fluid removal (L) to edw    Tubing Adult = 142 ml    Access Site AVG    Access Site Location Left    Keep SBP >: 95        02/14/21 1040              ACCESS SITE:       Hemodialysis Catheter  Right Subclavian (Active)   Site Assessment Clean;Dry;Intact 02/17/21 2000   Proximal Lumen Status / Patency Capped 02/17/21 0900   Proximal Lumen Intervention Clamped 02/17/21 0900   Medial Lumen Status / Patency Capped 02/17/21 0900   Medial Lumen Intervention Clamped 02/17/21 0900   Dressing Intervention No intervention needed 02/17/21 0900   Dressing Status      Clean;Dry;Intact/not removed 02/17/21 2000   Site Condition No complications 02/17/21 2000   Dressing Type CHG gel 02/17/21 2000        Arteriovenous Fistula - Vein Graft  Access Arteriovenous vein graft Left;Upper Arm (Active)   Site Assessment Clean;Dry;Intact 02/18/21 1146   AV Fistula Thrill Present;Bruit Present 02/18/21 1146   Status Deaccessed 02/18/21 1146   Dressing Intervention No intervention needed 02/18/21 1146   Dressing Status      Clean;Dry;Intact/not removed 02/18/21 1146   Site Condition No complications 02/18/21 1146   Dressing Occlusive 02/18/21 1146   Dressing To Be Removed (Date/Time) pls remove 4 to 6 hrs post Hd 02/18/21 1146         Patient Lines/Drains/Airways Status     Active Peripheral & Central Intravenous Access     Name Placement date Placement time Site Days    Peripheral IV 02/13/21 Posterior;Right Hand 02/13/21  1420  Hand  4  LAB RESULTS:  Lab Results   Component Value Date    NA 136 02/18/2021    K 3.5 02/18/2021    CL 98 02/18/2021    CO2 27.0 02/18/2021    BUN 19 02/18/2021    CREATININE 4.72 (H) 02/18/2021    GLU 132 02/18/2021    CALCIUM 9.1 02/18/2021    PHOS 4.2 02/18/2021    MG 2.5 02/18/2021    IRON 35 (L) 02/13/2021    LABIRON 21 02/13/2021    FERRITIN 2,051.1 (H) 02/13/2021    TIBC 163 (L) 02/13/2021     Lab Results   Component Value Date    WBC 12.1 (H) 02/18/2021    HGB 9.4 (L) 02/18/2021    HCT 28.3 (L) 02/18/2021    PLT 64 (L) 02/18/2021    APTT 33.0 02/15/2021        VITAL SIGNS:  Temperature     Date/Time Temp Temp src       02/18/21 1129 36.5 ??C (97.7 ??F) Oral         Hemodynamics     Date/Time Pulse BP MAP (mmHg) Patient Position    02/18/21 1129 74 172/72 ??? Sitting    02/18/21 1100 70 173/88 ??? Sitting    02/18/21 1030 76 173/72 ??? Sitting    02/18/21 1000 69 169/71 ??? Sitting    02/18/21 0930 70 157/74 ??? Sitting    02/18/21 0900 76 144/81 ??? Lying    02/18/21 0830 68 144/65 ??? Lying    02/18/21 0800 68 162/66 ??? Lying        Blood Volume Monitor     Date/Time Blood Volume Change (%) HCT HGB Critline O2 SAT % 02/18/21 1129 -11.3 % 31.2 10.6 95.5    02/18/21 1100 -10.9 % 31.1 10.6 95.8    02/18/21 1030 -11.7 % 31.4 10.7 95.5    02/18/21 1000 -9.4 % 30.6 10.4 95.7    02/18/21 0930 -9.2 % 30.5 10.4 95.4    02/18/21 0900 -7.9 % 30.1 10.2 95.4    02/18/21 0830 -7.5 % 29.9 10.2 95.8    02/18/21 0800 -6.1 % 29.5 10 95.4        Oxygen Therapy     Date/Time Resp SpO2 O2 Device FiO2 (%) O2 Flow Rate (L/min)    02/18/21 1129 18 ??? ??? -- --    02/18/21 1100 17 ??? None (Room air) -- --    02/18/21 1030 18 ??? None (Room air) -- --    02/18/21 1000 17 ??? None (Room air) -- --    02/18/21 0930 18 ??? None (Room air) -- --    02/18/21 0900 19 ??? None (Room air) -- --    02/18/21 0830 18 ??? None (Room air) -- --    02/18/21 0800 17 ??? None (Room air) -- --        Oxygen Connected to Wall:  no    Pre-Hemodialysis Assessment     Date/Time Therapy Number Dialyzer All Research scientist (physical sciences) Detector Dialysis Flow (mL/min)    02/18/21 0712 3 F-180 (98 mLs) Yes Engaged 800 mL/min    Date/Time Verify Priming Solution Priming Volume Hemodialysis Independent pH Hemodialysis Machine Conductivity (mS/cm) Hemodialysis Independent Conductivity (mS/cm)    02/18/21 0712 0.9% NS 300 mL ??? 13.8 mS/cm 13.7 mS/cm    Date/Time Bicarb Conductivity Residual Bleach Negative Free Chlorine Total Chlorine Chloramine    02/18/21 0712 -- Yes -- 0 --  Pre-Hemodialysis Treatment Comments     Date/Time Pre-Hemodialysis Comments    02/18/21 0712 alert,orientedx4.came in a bed        Hemodialysis Treatment     Date/Time Blood Flow Rate (mL/min) Arterial Pressure (mmHg) Venous Pressure (mmHg) Transmembrane Pressure (mmHg)    02/18/21 1129 220 mL/min -2 mmHg 152 mmHg 60 mmHg    02/18/21 1100 400 mL/min -113 mmHg 242 mmHg 50 mmHg    02/18/21 1030 400 mL/min -111 mmHg 245 mmHg 47 mmHg    02/18/21 1000 400 mL/min -109 mmHg 231 mmHg 50 mmHg    02/18/21 0930 350 mL/min -92 mmHg 208 mmHg 54 mmHg    02/18/21 0900 400 mL/min -188 mmHg 206 mmHg 53 mmHg    02/18/21 0830 400 mL/min -165 mmHg 209 mmHg 55 mmHg    02/18/21 0800 400 mL/min -121 mmHg 207 mmHg 50 mmHg    02/18/21 0740 400 mL/min -90 mmHg 200 mmHg 50 mmHg    Date/Time Ultrafiltration Rate (mL/hr) Ultrafiltrate Removed (mL) Dialysate Flow Rate (mL/min) KECN (Kecn)    02/18/21 1129 300 mL/hr 1562 mL 800 ml/min ???    02/18/21 1100 440 mL/hr 1361 mL 800 ml/min ???    02/18/21 1030 440 mL/hr 1188 mL 800 ml/min ???    02/18/21 1000 430 mL/hr 1006 mL 800 ml/min ???    02/18/21 0930 440 mL/hr 799 mL 800 ml/min ???    02/18/21 0900 440 mL/hr 590 mL 800 ml/min ???    02/18/21 0830 430 mL/hr 371 mL 800 ml/min ???    02/18/21 0800 430 mL/hr 164 mL 800 ml/min ???    02/18/21 0740 430 mL/hr 0 mL 800 ml/min ???        Hemodialysis Treatment Comments     Date/Time Intra-Hemodialysis Comments    02/18/21 1129 Treatment completed.rinsd with Ns    02/18/21 1100 stable.tolerating treatment well.    02/18/21 1030 stable    02/18/21 1000 stable,seen nad examined by Dr. Erling Conte    02/18/21 0930 stable,pt starting to get confuse.keeps moving access 1030    02/18/21 0900 VSS, NAD    02/18/21 0830 stable    02/18/21 0800 stable    02/18/21 0740 Treatment started per md order. Trying for 1 L UF        Post Treatment     Date/Time Rinseback Volume (mL) On Line Clearance: spKt/V Total Liters Processed (L/min) Dialyzer Clearance    02/18/21 1131 300 mL 2.08 spKt/V 78.7 L/min Moderately streaked          Post Hemodialysis Treatment Comments     Date/Time Post-Hemodialysis Comments    02/18/21 1131 stable post Hd        POST TREATMENT ASSESSMENT:  General appearance:  alert  Neurological:  Disoriented with situation/time  Lungs:  clear to auscultation bilaterally  Hearts:  regular rate and rhythm, S1, S2 normal, no murmur, click, rub or gallop  Abdomen:  soft, non-tender; bowel sounds normal; no masses,  no organomegaly  Pulses:  2+ and symmetric.  Skin:  Skin color, texture, turgor normal. No rashes or lesions    Hemodialysis I/O     Date/Time Total Hemodialysis Replacement Volume (mL) Total Ultrafiltrate Output (mL)    02/18/21 1131 ??? 1000 mL        [id number] - Medicaitons Given During Treatment  (last 4 hrs)         Blanche Gallien C Ary Rudnick, RN       Medication Name Action Time Action Route Rate Dose User  epoetin alfa-EPBX (RETACRIT) injection 1,000 Units 02/18/21 0827 Given Intravenous  1,000 Units Toribio Harbour, RN     heparin (porcine) 1000 unit/mL injection 3,000 Units 02/18/21 0825 Given Intravenous  3,000 Units Toribio Harbour, RN     paricalcitoL [facility]) injection 5 mcg 02/18/21 1610 Given Intravenous  5 mcg Toribio Harbour, RN                  Patient tolerated treatment in a  Bed.

## 2021-02-18 NOTE — Unmapped (Signed)
Closely monitor vital signs with Hd  WOF HD complications and keep vital signs within set  parameters  Plan to do 3.5 hours with 1 L UF    Problem: Device-Related Complication Risk (Hemodialysis)  Goal: Safe, Effective Therapy Delivery  Outcome: Progressing     Problem: Hemodynamic Instability (Hemodialysis)  Goal: Effective Tissue Perfusion  Outcome: Progressing     Problem: Infection (Hemodialysis)  Goal: Absence of Infection Signs and Symptoms  Outcome: Progressing

## 2021-02-18 NOTE — Unmapped (Signed)
Hematology Resident (MEDE) Progress Note    Assessment & Plan:   Cristina Fields is a 70 y.o. female with CLL, ESRD on iHD, hypertension, prior cerebellar infarct that presented as a direct admission from infusion clinic with disorientation and pain. Found to have Strep Bovis bacteremia.     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 *    Strep Bovis Bacteremia (1/2 blood cx positive on 4/25): 1/2 Blood Cx positive for Strep Bovis sp. Given immunocompromised state, will treat as true bacteremia.   -CTX 2g daily (4/27- , anticipate 2 week course at least)  -f/u repeat blood cultures from 4/27 (NG@72hrs )  -f/u TTE   -Consider lumbar puncture if mental status worsens  -ICID consulted, appreciate assistance  -Reports colonoscopy performed 1-2 years ago at [facility] - will try to get records.      Encephalopathy: Previously Grade 3. Worsening likely multifactorial in the setting of bacteremia per above, toxic-metabolic/uremia, and medication/pain driven superimposed on cognitive impairment at baseline (SLUMS 15 as outpatient).   -Treat infection  -Delirium precautions    Left AC OA: Experiencing some neck pain.   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 Seen by lower GI surgery this admission with no surgical needs.  -Wound Care consulted  -Pain control with scheduled tylenol, PRN oxycodone 2.5-5mg  q4h.     CLL - Worsening Leukocytosis with lymphocyte predominance:  Oncology history:  Follows with Dr. Lonni Fix. Diagnosed ~2012 and received fludarabine + cyclophosphamide + rituximab x6. Relapsed in 05/2014 treated with bendamustine + rituximab. Relapsed again in 2018 and started ibrutinib in 5/201. Rapid relapse in 04/2020 and was treated with rituximab and chlorambucil through November, 2021. On zanubrutinib prior to admission.   - HOLD zanubrutinib given possible need for LP and plt inhibition effects  - BMBx planned outpatient. Consider performing this while she is hospitalized.     Anemia - Thrombocytopenia:  -Transfuse for hgb <7, platelets <10,000, higher if actively bleeding    ESRD on iHD - HTN:   -iHD while inpatient  -Coreg to 25 mg BID; Nifedipine XL 60mg  daily; Hydralazine 10mg  q8h; Losartan 100mg  daily  -Lasix 80mg  daily    Daily Checklist:  Diet: Regular Diet  DVT PPx: SCDs, holding heparin given thrombocytopenia and bleeding risk.   Electrolytes: Replete Potassium to >/= 3.6 and Magnesium to >/= 1.8  Code Status: Full Code  Dispo: Admit to 4 ONC    Team Contact Information:   Primary Team: Hematology Resident (MEDE)  Primary Resident: Sandra Cockayne, MD  Resident's Pager: [phone] (Hematology Intern - Cliffton Asters)    Interval History:   She does not feel like herself today after coming back from dialysis, and husband is concerned about her thinking. We will check on her this evening to see if she improves or not.     ROS otherwise negative.     Objective:   Temp:  [36.4 ??C-36.7 ??C] 36.5 ??C  Heart Rate:  [68-76] 74  Resp:  [17-19] 18  BP: (125-173)/(50-88) 172/72  SpO2:  [93 %-95 %] 94 %    Constitutional: Lying in bed. Confused countenance  Respiratory: Normal work of breathing.  MSK: No LE edema.  Skin: Warm and dry. No rashes.   Neuro: Mild word finding difficulty. Oriented to self. Sensation and strength intact throughout extremities (major muscle groups of upper and lower tested).     Labs/Studies: Labs and Studies from the last 24hrs per EMR and Reviewed

## 2021-02-18 NOTE — Unmapped (Signed)
[facility] Nephrology Hemodialysis Procedure Note     02/18/2021    Cristina Fields was seen and examined on hemodialysis    CHIEF COMPLAINT: End Stage Renal Disease     -- MRI with chronic cerebellar hemorrhages.   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     INTERVAL HISTORY: Dialysis going well. Patient is below dry weight - will adjust    DIALYSIS TREATMENT DATA:  Estimated Dry Weight (kg): 49.5 kg (109 lb 2 oz)  Patient Goal Weight (kg): 1.4 kg (3 lb 1.4 oz)  Dialyzer: F-180 (98 mLs)  Dialysis Bath  Bath: 3 K+ / 2.5 Ca+  Dialysate Na (mEq/L): 137 mEq/L  Dialysate HCO3 (mEq/L): 35 mEq/L  Dialysate Total Buffer HCO3 (mEq/L): 35 mEq/L  Blood Flow Rate (mL/min): 400 mL/min  Dialysis Flow (mL/min): 800 mL/min    PHYSICAL EXAM:  Vitals:  Temp:  [36.4 ??C (97.5 ??F)-36.7 ??C (98.1 ??F)] 36.6 ??C (97.9 ??F)  Heart Rate:  [68-76] 69  BP: (125-169)/(50-81) 169/71  MAP (mmHg):  [85-86] 86  Weights:  Pre-Treatment Weight (kg): 48.1 kg (106 lb 0.7 oz)    General: Appearing in no acute distress  Pulmonary: clear to auscultation  Cardiovascular: regular rate and rhythm  Extremities: no significant  edema  Access: LUE AV graft   Dialyzing in bed    LAB DATA:  Lab Results   Component Value Date    NA 136 02/18/2021    K 3.5 02/18/2021    CL 98 02/18/2021    CO2 27.0 02/18/2021    BUN 19 02/18/2021    CREATININE 4.72 (H) 02/18/2021      Lab Results   Component Value Date    HCT 28.3 (L) 02/18/2021    WBC 12.1 (H) 02/18/2021        ASSESSMENT/PLAN:  End Stage Renal Disease on Intermittent Hemodialysis:  - UF goal: 1.0L as tolerated  - Obtain post weight today to establish new dry weight   - Adjust medications for a GFR <10  - Avoid nephrotoxic agents     Bone Mineral Metabolism:  Lab Results   Component Value Date    CALCIUM 9.1 02/18/2021    CALCIUM 8.5 (L) 02/17/2021    Lab Results   Component Value Date    ALBUMIN 2.9 (L) 02/17/2021    ALBUMIN 2.6 (L) 02/14/2021      Lab Results   Component Value Date    PHOS 4.2 02/18/2021    PHOS 3.0 02/17/2021    No results found for: PTH   - Continue phosphorus binder and dietary counseling.    Anemia:   Lab Results   Component Value Date    HGB 9.4 (L) 02/18/2021    HGB 9.1 (L) 02/17/2021    HGB 9.4 (L) 02/16/2021    Iron Saturation (%)   Date Value Ref Range Status   02/13/2021 21 % Final      Lab Results   Component Value Date    FERRITIN 2,051.1 (H) 02/13/2021       Continue intravenous Epogen/Retacrit 1K units with each treatment.       Humberto Seals, MD  [facility] Division of Nephrology & Hypertension

## 2021-02-19 DIAGNOSIS — B954 Other streptococcus as the cause of diseases classified elsewhere: Secondary | ICD-10-CM | POA: Diagnosis not present

## 2021-02-19 DIAGNOSIS — D696 Thrombocytopenia, unspecified: Secondary | ICD-10-CM | POA: Diagnosis not present

## 2021-02-19 DIAGNOSIS — R7881 Bacteremia: Secondary | ICD-10-CM | POA: Diagnosis not present

## 2021-02-19 DIAGNOSIS — C9112 Chronic lymphocytic leukemia of B-cell type in relapse: Secondary | ICD-10-CM | POA: Diagnosis not present

## 2021-02-19 LAB — SLIDE REVIEW

## 2021-02-19 LAB — CBC W/ AUTO DIFF
BASOPHILS ABSOLUTE COUNT: 0.1 10*9/L (ref 0.0–0.1)
BASOPHILS RELATIVE PERCENT: 1 %
EOSINOPHILS ABSOLUTE COUNT: 0.4 10*9/L (ref 0.0–0.5)
EOSINOPHILS RELATIVE PERCENT: 3.1 %
HEMATOCRIT: 30 % — ABNORMAL LOW (ref 34.0–44.0)
HEMOGLOBIN: 10 g/dL — ABNORMAL LOW (ref 11.3–14.9)
LYMPHOCYTES ABSOLUTE COUNT: 8 10*9/L — ABNORMAL HIGH (ref 1.1–3.6)
LYMPHOCYTES RELATIVE PERCENT: 71.2 %
MEAN CORPUSCULAR HEMOGLOBIN CONC: 33.5 g/dL (ref 32.0–36.0)
MEAN CORPUSCULAR HEMOGLOBIN: 27.7 pg (ref 25.9–32.4)
MEAN CORPUSCULAR VOLUME: 82.7 fL (ref 77.6–95.7)
MEAN PLATELET VOLUME: 10.1 fL (ref 6.8–10.7)
MONOCYTES ABSOLUTE COUNT: 0.3 10*9/L (ref 0.3–0.8)
MONOCYTES RELATIVE PERCENT: 2.4 %
NEUTROPHILS ABSOLUTE COUNT: 2.5 10*9/L (ref 1.8–7.8)
NEUTROPHILS RELATIVE PERCENT: 22.3 %
PLATELET COUNT: 66 10*9/L — ABNORMAL LOW (ref 150–450)
RED BLOOD CELL COUNT: 3.63 10*12/L — ABNORMAL LOW (ref 3.95–5.13)
RED CELL DISTRIBUTION WIDTH: 17.1 % — ABNORMAL HIGH (ref 12.2–15.2)
WBC ADJUSTED: 11.3 10*9/L — ABNORMAL HIGH (ref 3.6–11.2)

## 2021-02-19 LAB — MAGNESIUM: MAGNESIUM: 2 mg/dL (ref 1.6–2.6)

## 2021-02-19 LAB — BASIC METABOLIC PANEL
ANION GAP: 10 mmol/L (ref 5–14)
BLOOD UREA NITROGEN: 8 mg/dL — ABNORMAL LOW (ref 9–23)
BUN / CREAT RATIO: 2
CALCIUM: 9.4 mg/dL (ref 8.7–10.4)
CHLORIDE: 99 mmol/L (ref 98–107)
CO2: 26 mmol/L (ref 20.0–31.0)
CREATININE: 3.22 mg/dL — ABNORMAL HIGH
EGFR CKD-EPI AA FEMALE: 16 mL/min/{1.73_m2} — ABNORMAL LOW (ref >=60)
EGFR CKD-EPI NON-AA FEMALE: 14 mL/min/{1.73_m2} — ABNORMAL LOW (ref >=60)
GLUCOSE RANDOM: 90 mg/dL (ref 70–179)
POTASSIUM: 3.9 mmol/L (ref 3.4–4.8)
SODIUM: 135 mmol/L (ref 135–145)

## 2021-02-19 LAB — CK: CREATINE KINASE TOTAL: <15 U/L — ABNORMAL LOW

## 2021-02-19 LAB — PHOSPHORUS: PHOSPHORUS: 3.9 mg/dL (ref 2.4–5.1)

## 2021-02-19 MED ADMIN — carvediloL (COREG) tablet 25 mg: 25 mg | ORAL

## 2021-02-19 MED ADMIN — diclofenac sodium (VOLTAREN) 1 % gel 2 g: 2 g | TOPICAL | @ 10:00:00

## 2021-02-19 MED ADMIN — cefTRIAXone (ROCEPHIN) 2 g in sodium chloride 0.9 % (NS) 100 mL IVPB-connector bag: 2 g | INTRAVENOUS | @ 15:00:00 | Stop: 2021-03-02

## 2021-02-19 MED ADMIN — losartan (COZAAR) tablet 100 mg: 100 mg | ORAL | @ 13:00:00

## 2021-02-19 MED ADMIN — sertraline (ZOLOFT) tablet 100 mg: 100 mg | ORAL | @ 01:00:00

## 2021-02-19 MED ADMIN — acetaminophen (TYLENOL) tablet 1,000 mg: 1000 mg | ORAL | @ 20:00:00

## 2021-02-19 MED ADMIN — acetaminophen (TYLENOL) tablet 1,000 mg: 1000 mg | ORAL | @ 01:00:00

## 2021-02-19 MED ADMIN — carvediloL (COREG) tablet 25 mg: 25 mg | ORAL | @ 13:00:00

## 2021-02-19 MED ADMIN — diclofenac sodium (VOLTAREN) 1 % gel 2 g: 2 g | TOPICAL | @ 01:00:00

## 2021-02-19 MED ADMIN — melatonin tablet 3 mg: 3 mg | ORAL | @ 01:00:00

## 2021-02-19 MED ADMIN — hydrALAZINE (APRESOLINE) tablet 15 mg: 15 mg | ORAL | @ 01:00:00

## 2021-02-19 MED ADMIN — oxyCODONE (ROXICODONE) immediate release tablet 2.5 mg: 2.5 mg | ORAL | @ 13:00:00 | Stop: 2021-02-27

## 2021-02-19 MED ADMIN — lidocaine (LIDODERM) 5 % patch 2 patch: 2 | TRANSDERMAL | @ 03:00:00

## 2021-02-19 MED ADMIN — hydrALAZINE (APRESOLINE) tablet 15 mg: 15 mg | ORAL | @ 10:00:00

## 2021-02-19 MED ADMIN — furosemide (LASIX) tablet 80 mg: 80 mg | ORAL | @ 13:00:00

## 2021-02-19 MED ADMIN — acetaminophen (TYLENOL) tablet 1,000 mg: 1000 mg | ORAL | @ 10:00:00

## 2021-02-19 MED ADMIN — oxyCODONE (ROXICODONE) immediate release tablet 2.5 mg: 2.5 mg | ORAL | @ 21:00:00 | Stop: 2021-02-27

## 2021-02-19 MED ADMIN — diclofenac sodium (VOLTAREN) 1 % gel 2 g: 2 g | TOPICAL | @ 15:00:00

## 2021-02-19 MED ADMIN — NIFEdipine (PROCARDIA XL) 24 hr tablet 60 mg: 60 mg | ORAL | @ 13:00:00

## 2021-02-19 MED ADMIN — hydrALAZINE (APRESOLINE) tablet 15 mg: 15 mg | ORAL | @ 20:00:00

## 2021-02-19 MED ADMIN — atorvastatin (LIPITOR) tablet 40 mg: 40 mg | ORAL | @ 13:00:00

## 2021-02-19 NOTE — Unmapped (Signed)
Pt alert and oriented x1, improved throughout the day. Cleaned pt changed wound. Antibiotics given. Encouraged pt to drink and eat. BM recorded. Pt without falls. Will continue to monitor.   Problem: Adult Inpatient Plan of Care  Goal: Plan of Care Review  Outcome: Ongoing - Unchanged  Goal: Patient-Specific Goal (Individualized)  Outcome: Ongoing - Unchanged  Goal: Absence of Hospital-Acquired Illness or Injury  Outcome: Ongoing - Unchanged  Intervention: Identify and Manage Fall Risk  Recent Flowsheet Documentation  Taken 02/18/2021 1300 by Phineas Douglas, RN  Safety Interventions:   low bed   lighting adjusted for tasks/safety   fall reduction program maintained   nonskid shoes/slippers when out of bed   bed alarm  Intervention: Prevent Skin Injury  Recent Flowsheet Documentation  Taken 02/18/2021 1300 by Phineas Douglas, RN  Skin Protection:   adhesive use limited   cleansing with dimethicone incontinence wipes  Goal: Optimal Comfort and Wellbeing  Outcome: Ongoing - Unchanged  Goal: Readiness for Transition of Care  Outcome: Ongoing - Unchanged  Goal: Rounds/Family Conference  Outcome: Ongoing - Unchanged     Problem: Self-Care Deficit  Goal: Improved Ability to Complete Activities of Daily Living  Outcome: Ongoing - Unchanged     Problem: Fall Injury Risk  Goal: Absence of Fall and Fall-Related Injury  Outcome: Ongoing - Unchanged  Intervention: Promote Injury-Free Environment  Recent Flowsheet Documentation  Taken 02/18/2021 1300 by Phineas Douglas, RN  Safety Interventions:   low bed   lighting adjusted for tasks/safety   fall reduction program maintained   nonskid shoes/slippers when out of bed   bed alarm     Problem: Impaired Wound Healing  Goal: Optimal Wound Healing  Outcome: Ongoing - Unchanged     Problem: Device-Related Complication Risk (Hemodialysis)  Goal: Safe, Effective Therapy Delivery  Outcome: Ongoing - Unchanged     Problem: Hemodynamic Instability (Hemodialysis)  Goal: Effective Tissue Perfusion  Outcome: Ongoing - Unchanged     Problem: Infection (Hemodialysis)  Goal: Absence of Infection Signs and Symptoms  Outcome: Ongoing - Unchanged     Problem: Skin Injury Risk Increased  Goal: Skin Health and Integrity  Outcome: Ongoing - Unchanged  Intervention: Optimize Skin Protection  Recent Flowsheet Documentation  Taken 02/18/2021 1400 by Phineas Douglas, RN  Pressure Reduction Techniques:   frequent weight shift encouraged   weight shift assistance provided  Taken 02/18/2021 1300 by Phineas Douglas, RN  Pressure Reduction Techniques:   weight shift assistance provided   frequent weight shift encouraged   heels elevated off bed  Pressure Reduction Devices: pressure-redistributing mattress utilized  Skin Protection:   adhesive use limited   cleansing with dimethicone incontinence wipes

## 2021-02-19 NOTE — Unmapped (Addendum)
Patient is alert,oriented person and place.Encouraged to eat and drink.NPO for possible procedure today.Fall precautions taken.Wound dressing done in the shift.    Problem: Adult Inpatient Plan of Care  Goal: Absence of Hospital-Acquired Illness or Injury  Intervention: Prevent Skin Injury  Recent Flowsheet Documentation  Taken 02/18/2021 2011 by Michelle Nasuti, RN  Skin Protection: adhesive use limited     Problem: Adult Inpatient Plan of Care  Goal: Absence of Hospital-Acquired Illness or Injury  Intervention: Identify and Manage Fall Risk  Recent Flowsheet Documentation  Taken 02/19/2021 0200 by Michelle Nasuti, RN  Safety Interventions:  ??? bed alarm  ??? fall reduction program maintained  ??? environmental modification  ??? low bed  ??? lighting adjusted for tasks/safety  ??? nonskid shoes/slippers when out of bed  Taken 02/18/2021 2157 by Michelle Nasuti, RN  Safety Interventions:  ??? bed alarm  ??? fall reduction program maintained  ??? environmental modification  ??? low bed  ??? lighting adjusted for tasks/safety  ??? nonskid shoes/slippers when out of bed     Problem: Fall Injury Risk  Goal: Absence of Fall and Fall-Related Injury  Intervention: Promote Injury-Free Environment  Recent Flowsheet Documentation  Taken 02/19/2021 0200 by Michelle Nasuti, RN  Safety Interventions:  ??? bed alarm  ??? fall reduction program maintained  ??? environmental modification  ??? low bed  ??? lighting adjusted for tasks/safety  ??? nonskid shoes/slippers when out of bed  Taken 02/18/2021 2157 by Michelle Nasuti, RN  Safety Interventions:  ??? bed alarm  ??? fall reduction program maintained  ??? environmental modification  ??? low bed  ??? lighting adjusted for tasks/safety  ??? nonskid shoes/slippers when out of bed     Problem: Impaired Wound Healing  Goal: Optimal Wound Healing  Intervention: Promote Wound Healing  Recent Flowsheet Documentation  Taken 02/18/2021 2157 by Michelle Nasuti, RN  Activity Management: activity adjusted per tolerance

## 2021-02-19 NOTE — Unmapped (Signed)
VENOUS ACCESS TEAM PROCEDURE    Order was placed for a PIV by Venous Access Team (VAT).  Patient has LUE AV fistula. Rocephin q24hr, due again 5/2 at 1100. NPO at midnight for possible TEE tomorrow AM. Spoke to care RN, recommend to order PIV closer to time needed. Please pass along to night shift RN to place order close to 0700 so pt has access for TEE, or o/n if needed for anything tonight.    Workup / Procedure Time:  15 minutes       Care RN was notified.       Thank you,     Jacqulyn Liner RN Venous Access Team

## 2021-02-19 NOTE — Unmapped (Signed)
Hematology Resident (MEDE) Progress Note    Assessment & Plan:   Cristina Fields is a 70 y.o. female with CLL, ESRD on iHD, hypertension, prior cerebellar infarct that presented as a direct admission from infusion clinic with disorientation and pain. Found to have Strep Bovis bacteremia.     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 *    Strep Bovis Bacteremia (1/2 blood cx positive on 4/25): 1/2 Blood Cx positive for Strep Bovis sp. Given immunocompromised state, will treat as true bacteremia.   -CTX 2g daily (4/27 date of clearance, anticipate at least 2 week course)  -f/u repeat blood cultures from 4/27 (NGTD  -TEE ordered, cardiology paged  -Consider inpatient anoscopy, should have outpatient referral to GI for colonoscopy at discharge.   -Consider if dialysis line needs to be removed  -ICID consulted, appreciate assistance  -Reports colonoscopy performed 1-2 years ago at [facility] - requested records.      Encephalopathy: Previously Grade 3. Worsening likely multifactorial in the setting of bacteremia per above, toxic-metabolic/uremia, and medication/pain driven superimposed on cognitive impairment at baseline (SLUMS 15 as outpatient).   -Treat infection  -Delirium precautions    Left AC OA: Experiencing some neck pain that changes in location each day on exam.   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 Seen by lower GI surgery this admission with no surgical needs at this time.  -Wound Care consulted, wet to dry dressings  -Will need outpatient pap smear to assess for cervical HPV/malignancy  -Pain control with scheduled tylenol, PRN oxycodone 2.5-5mg  q4h.     CLL - Worsening Leukocytosis with lymphocyte predominance:  Oncology history:  Follows with Dr. Lonni Fix. Diagnosed ~2012 and received fludarabine + cyclophosphamide + rituximab x6. Relapsed in 05/2014 treated with bendamustine + rituximab. Relapsed again in 2018 and started ibrutinib in 5/201. Rapid relapse in 04/2020 and was treated with rituximab and chlorambucil through November, 2021. On zanubrutinib prior to admission.   - HOLD zanubrutinib given planned TEE next week.   - BMBx planned outpatient. Can consider performing this while she is hospitalized.     Anemia - Thrombocytopenia:  -Transfuse for hgb <7, platelets <10,000, higher if actively bleeding    ESRD on iHD - HTN:   -iHD while inpatient  -Coreg 25 mg BID; Nifedipine XL 60mg  daily; Hydralazine 10mg  q8h; Losartan 100mg  daily  -Lasix 80mg  daily    Daily Checklist:  Diet: Regular Diet  DVT PPx: SCDs, holding heparin given thrombocytopenia and bleeding risk.   Electrolytes: Replete Potassium to >/= 3.6 and Magnesium to >/= 1.8  Code Status: Full Code  Dispo: Admit to 4 ONC    Team Contact Information:   Primary Team: Hematology Resident (MEDE)  Primary Resident: Coralee Pesa, MD  Resident's Pager: [phone] (Hematology Intern - Cliffton Asters)    Interval History:   Still oriented only to self and year. Difficulty with word finding on exam continues, no different than previous exams. Not oriented to place - thinks in church. Knows who Oprah is on the TV. Still reporting neck pain but cannot localize  where it hurts precisely, points to anterior neck today near midline.     ROS otherwise negative.     Objective:   Temp:  [36.3 ??C-36.8 ??C] 36.3 ??C  Heart Rate:  [65-87] 75  Resp:  [12-18] 16  BP: (141-178)/(46-65) 142/58  SpO2:  [98 %-100 %] 98 %    General: Sitting up in bed eating eggs and bacon for breakfast. In NAD.    Neck: Reported tenderness to palpation on left neck lateral to sternal noch and below clavicle, no point tenderness over clavicle. No warmth, redness, or fluctuance of the skin over areas where patient reports pain.   Cardiac: Regular rate and rhythm.    Pulm: Normal work of breathing on RA.  MSK: No LE edema.  Skin: Warm and dry. No rashes. Condyloma on rectum not examined today.   Neuro: Word finding difficulties persist today, stable from prior exams. Alert and oriented to self, year, oprah on TV, but not place.     Labs/Studies: Labs and Studies from the last 24hrs per EMR and Reviewed

## 2021-02-19 NOTE — Unmapped (Signed)
IMMUNOCOMPROMISED HOST INFECTIOUS DISEASE PROGRESS NOTE    Assessment/Plan:     Cristina Fields is a 71 y.o. female    ID Problem List:  CLL Dx 2012; relapsed in 05/2014, 2018, 04/2020  - Prior chemotherapy: fludarabine, cyclophos, ritux, bendamustine, ibrutinib, chlorambucil  - Current chemotherapy: zanubrutinib - on hold since admission  - Infection Prophylaxis: none     Infection History  # S.Bovis bacteremia 02/13/21  -4/25 BCx (PIV) S.bovis x 1 set  -4/27 BCx BGTD  -4/27 TTE thickening of AV, MV, TV likely degen change, but cannot r/o endocarditis     # Encephalopathy favor toxic metabolic 02/13/21, improved  -4/27 MRI brain chronic cerebellar hemorrhages, no acute intracranial hemorrhage    # Significant L clavicular head, L Lower sternocleidomastoid, L supraspinatus tenderness ~ early 01/2021 c/f myositis? Osteo? Abscess?  - 4/29 exam significant tenderness      Pertinent Co-morbidities  T2DM (A1c 5.6; 01/06/21)  ESRD on iHD  Rectal condyloma acuminata with HSIL/AIN3 01/30/21  Cognitive impairment SLUMS score of 15/30 c/t dementia 01/29/21     Drug Intolerances  none         RECOMMENDATIONS FOR 02/18/2021    Diagnostic  ??? Given equivocal TTE, f/u TEE as it would help guide duration of therapy   ??? Consider MRI brain given persistent word-finding difficulties  ?? Needs outpatient colonoscopy to rule out GI malignancy I/s/o S.bovis BSI. Noted it may be worthwhile to have at least anoscopy performed while she's in-house as she's definitely at high risk of having colorectal cancer  ?? Needs outpatient PAP smear given condylomata  ?? Please obtain CT neck to evaluate L clavicular pain/L sternocleidomastoid pain (would discuss best modality with radiology, ? CT neck with contrast) - concerned for osteo, septic joint, or myositis  ?? Please check CKs  ?? F/u susceptibilities of S.bovis growing on 4/25 BCs  ?? Monitor for toxicity of parenteral antibiotics. Recommend following CBC w/diff, CMP at least weekly while on IV therapy    Treatment  ?? Continue ceftriaxone 2g q24h. If S.bovis is sens to penicillin, will transition to Pen G IV.   ?? Duration TBD - awaiting TEE          The [facility] ID service will continue to follow.  Please page the ID Transplant/Liquid Oncology Fellow consult at [phone] with questions.  Patient discussed with Dr. Annye Rusk, MD  Infectious Diseases Fellow, PGY [address] [place], [place]  02/18/2021 10:08 PM      Subjective:     Interval History:    History obtained from:patient and family member.  Afebrile. Reports feeling okay. Reports pain in her mouth some time ago, now resolved, also reports prior lower abdominal pain that is resolved. No SOB, cough, chest pain, n/v/d. Does note she had a colonoscopy 4 years ago, cannot remember her last pap smear. Reports ~3wk h/o L clavicular head/L sternocleidomastoid pain     Medications:  Antimicrobials:  Anti-infectives (From admission, onward)    Start     Dose/Rate Route Frequency Ordered Stop    02/16/21 1100  cefTRIAXone (ROCEPHIN) 2 g in sodium chloride 0.9 % (NS) 100 mL IVPB-connector bag         2 g  200 mL/hr over 30 Minutes Intravenous Every 24 hours 02/16/21 1021 02/23/21 1059          Prior/Current immunomodulators:  zanubrutinib - on hold now    Other medications reviewed.  Objective:     Vital Signs last 24 hours:  Temp:  [36.3 ??C (97.3 ??F)-36.8 ??C (98.2 ??F)] 36.8 ??C (98.2 ??F)  Heart Rate:  [65-87] 87  Resp:  [16-19] 16  BP: (125-178)/(50-88) 164/58  MAP (mmHg):  [88-92] 88  SpO2:  [93 %-100 %] 100 %    Physical Exam:  Patient Lines/Drains/Airways Status     Active Active Lines, Drains, & Airways     Name Placement date Placement time Site Days    Peripheral IV 02/13/21 Posterior;Right Hand 02/13/21  1420  Hand  5    Hemodialysis Catheter  Right Subclavian ???  ???  Subclavian      Arteriovenous Fistula - Vein Graft  Access Arteriovenous vein graft Left;Upper Arm ???  ???  Arm                Const: lying in bed comfortably, not in acute distress, alert and awake  Eyes: sclerae anicteric and non injected  ENT: no dental carries, no oral thrush, no ulcer  CV: RRR, no peripheral edema.   Resp: normal work of breathing at rest and CTAB anteriorly  GI: soft, NTND  GU: deferred  Skin: no petechiae, ecchymoses or obvious rashes on clothed exam. R chest HD cath c/d/i  MSK: marked tenderness at L clavicular head. Marked tenderness at lower part of L sternocleidomastoid. Marked tenderness at L supraspinatus  Neuro: alert and oriented x3, apparent word finding difficulties, no tremor noted, facial expression symmetric and moves extremities   Psych: calm, appropriate eye contact    Data for Medical Decision Making     Recent Labs   Lab Units 02/18/21  0602 02/17/21  0445 02/16/21  0658 02/15/21  0600 02/14/21  0523 02/13/21  1747 02/13/21  1418 02/13/21  1418   WBC 10*9/L 12.1* 13.2* 11.7* 11.6* 13.7*  --   --  13.6*   HEMOGLOBIN g/dL 9.4* 9.1* 9.4* 8.8* 8.7*  --   --  10.1*   PLATELET COUNT (1) 10*9/L 64* 61* 62* 49* 54*  --   --  61*   NEUTRO ABS 10*9/L 2.6 2.3 2.3 2.1 2.2  --   --  2.9   LYMPHO ABS 10*9/L 8.7* 10.2* 9.1* 9.0*  --   --   --  10.1*   EOSINO ABS 10*9/L 0.4 0.2 0.1 0.2  --   --   --  0.2   BUN mg/dL 19 13 25* 16 41* 33*   < >  --    CREATININE mg/dL 8.11* 9.14* 7.82* 9.56* 8.85* 8.31*   < >  --    AST U/L  --  26  --   --  21 25  --   --    ALT U/L  --  7*  --   --  <7* 8*  --   --    BILIRUBIN TOTAL mg/dL  --  0.3  --   --  0.2* 0.3  --   --    ALK PHOS U/L  --  99  --   --  74 85  --   --    POTASSIUM mmol/L 3.5 3.5 3.7 3.6 3.8 4.0   < >  --    MAGNESIUM mg/dL 2.5 1.6 1.8 1.6 1.8 2.1   < >  --    PHOSPHORUS mg/dL 4.2 3.0 5.0 3.0 4.3  --   --   --    CALCIUM mg/dL 9.1 8.5* 9.0 8.5* 8.6* 9.2   < >  --     < > =  values in this interval not displayed.       New Culture Data      Microbiology Results (last day)       Procedure Component Value Date/Time Date/Time    Blood Culture #1 [[id number]]  (Abnormal) Collected: 02/13/21 1746    Lab Status: Preliminary result Specimen: Blood from 1 Peripheral Draw Updated: 02/17/21 0804     Blood Culture, Routine Streptococcus bovis group     Comment: Susceptibility Testing By Consultation Only  Processed at Physicians Request        Gram Stain Result Gram positive cocci in chains    MADD [[id number]] Collected: 02/15/21 1314    Lab Status: Final result Specimen: Blood from 1 Peripheral Draw Updated: 02/17/21 0800     MADD Reqeust Additional request in process    Micro Add-on [[id number]] Collected: 02/13/21 1746    Lab Status: In process Specimen: Blood from 1 Peripheral Draw Updated: 02/17/21 0758    Blood Culture, Adult [[id number]]  (Normal) Collected: 02/13/21 2207    Lab Status: Preliminary result Specimen: Blood from 1 Peripheral Draw Updated: 02/16/21 2215     Blood Culture, Routine No Growth at 72 hours    Blood Culture, Adult [[id number]] Collected: 02/16/21 1905    Lab Status: In process Specimen: Blood from Central Venous Line Updated: 02/16/21 1910    Blood Culture #1 [[id number]]  (Normal) Collected: 02/15/21 1314    Lab Status: Preliminary result Specimen: Blood from 1 Peripheral Draw Updated: 02/16/21 1345     Blood Culture, Routine No Growth at 24 hours    Blood Culture #2 [[id number]]  (Normal) Collected: 02/15/21 1314    Lab Status: Preliminary result Specimen: Blood from 1 Peripheral Draw Updated: 02/16/21 1345     Blood Culture, Routine No Growth at 24 hours               Recent Studies      No results found.

## 2021-02-20 DIAGNOSIS — R7881 Bacteremia: Secondary | ICD-10-CM | POA: Diagnosis not present

## 2021-02-20 DIAGNOSIS — B954 Other streptococcus as the cause of diseases classified elsewhere: Secondary | ICD-10-CM | POA: Diagnosis not present

## 2021-02-20 DIAGNOSIS — C9112 Chronic lymphocytic leukemia of B-cell type in relapse: Secondary | ICD-10-CM | POA: Diagnosis not present

## 2021-02-20 DIAGNOSIS — G9340 Encephalopathy, unspecified: Secondary | ICD-10-CM | POA: Diagnosis not present

## 2021-02-20 DIAGNOSIS — J90 Pleural effusion, not elsewhere classified: Secondary | ICD-10-CM | POA: Diagnosis not present

## 2021-02-20 DIAGNOSIS — R590 Localized enlarged lymph nodes: Secondary | ICD-10-CM | POA: Diagnosis not present

## 2021-02-20 DIAGNOSIS — D696 Thrombocytopenia, unspecified: Secondary | ICD-10-CM | POA: Diagnosis not present

## 2021-02-20 LAB — CBC W/ AUTO DIFF
BASOPHILS ABSOLUTE COUNT: 0.1 10*9/L (ref 0.0–0.1)
BASOPHILS RELATIVE PERCENT: 0.7 %
EOSINOPHILS ABSOLUTE COUNT: 0.4 10*9/L (ref 0.0–0.5)
EOSINOPHILS RELATIVE PERCENT: 3.9 %
HEMATOCRIT: 28.9 % — ABNORMAL LOW (ref 34.0–44.0)
HEMOGLOBIN: 9.5 g/dL — ABNORMAL LOW (ref 11.3–14.9)
LYMPHOCYTES ABSOLUTE COUNT: 7 10*9/L — ABNORMAL HIGH (ref 1.1–3.6)
LYMPHOCYTES RELATIVE PERCENT: 70.1 %
MEAN CORPUSCULAR HEMOGLOBIN CONC: 32.8 g/dL (ref 32.0–36.0)
MEAN CORPUSCULAR HEMOGLOBIN: 27.5 pg (ref 25.9–32.4)
MEAN CORPUSCULAR VOLUME: 83.8 fL (ref 77.6–95.7)
MEAN PLATELET VOLUME: 9.6 fL (ref 6.8–10.7)
MONOCYTES ABSOLUTE COUNT: 0.2 10*9/L — ABNORMAL LOW (ref 0.3–0.8)
MONOCYTES RELATIVE PERCENT: 2.3 %
NEUTROPHILS ABSOLUTE COUNT: 2.3 10*9/L (ref 1.8–7.8)
NEUTROPHILS RELATIVE PERCENT: 23 %
PLATELET COUNT: 59 10*9/L — ABNORMAL LOW (ref 150–450)
RED BLOOD CELL COUNT: 3.45 10*12/L — ABNORMAL LOW (ref 3.95–5.13)
RED CELL DISTRIBUTION WIDTH: 17.2 % — ABNORMAL HIGH (ref 12.2–15.2)
WBC ADJUSTED: 10 10*9/L (ref 3.6–11.2)

## 2021-02-20 LAB — BASIC METABOLIC PANEL
ANION GAP: 11 mmol/L (ref 5–14)
BLOOD UREA NITROGEN: 20 mg/dL (ref 9–23)
BUN / CREAT RATIO: 4
CALCIUM: 9.2 mg/dL (ref 8.7–10.4)
CHLORIDE: 99 mmol/L (ref 98–107)
CO2: 27 mmol/L (ref 20.0–31.0)
CREATININE: 4.79 mg/dL — ABNORMAL HIGH
EGFR CKD-EPI AA FEMALE: 10 mL/min/{1.73_m2} — ABNORMAL LOW (ref >=60)
EGFR CKD-EPI NON-AA FEMALE: 9 mL/min/{1.73_m2} — ABNORMAL LOW (ref >=60)
GLUCOSE RANDOM: 123 mg/dL (ref 70–179)
POTASSIUM: 3.3 mmol/L — ABNORMAL LOW (ref 3.4–4.8)
SODIUM: 137 mmol/L (ref 135–145)

## 2021-02-20 LAB — HEPATIC FUNCTION PANEL
ALBUMIN: 2.7 g/dL — ABNORMAL LOW (ref 3.4–5.0)
ALKALINE PHOSPHATASE: 165 U/L — ABNORMAL HIGH (ref 46–116)
ALT (SGPT): 18 U/L (ref 10–49)
AST (SGOT): 35 U/L — ABNORMAL HIGH (ref <=34)
BILIRUBIN DIRECT: 0.1 mg/dL (ref 0.00–0.30)
BILIRUBIN TOTAL: 0.2 mg/dL — ABNORMAL LOW (ref 0.3–1.2)
PROTEIN TOTAL: 5 g/dL — ABNORMAL LOW (ref 5.7–8.2)

## 2021-02-20 LAB — MAGNESIUM: MAGNESIUM: 2 mg/dL (ref 1.6–2.6)

## 2021-02-20 LAB — PHOSPHORUS: PHOSPHORUS: 4.5 mg/dL (ref 2.4–5.1)

## 2021-02-20 MED ADMIN — atorvastatin (LIPITOR) tablet 40 mg: 40 mg | ORAL | @ 13:00:00

## 2021-02-20 MED ADMIN — NIFEdipine (PROCARDIA XL) 24 hr tablet 60 mg: 60 mg | ORAL | @ 13:00:00 | Stop: 2021-02-20

## 2021-02-20 MED ADMIN — carvediloL (COREG) tablet 25 mg: 25 mg | ORAL | @ 13:00:00

## 2021-02-20 MED ADMIN — losartan (COZAAR) tablet 100 mg: 100 mg | ORAL | @ 13:00:00

## 2021-02-20 MED ADMIN — cefTRIAXone (ROCEPHIN) 2 g in sodium chloride 0.9 % (NS) 100 mL IVPB-connector bag: 2 g | INTRAVENOUS | @ 15:00:00 | Stop: 2021-03-02

## 2021-02-20 MED ADMIN — fentaNYL (PF) (SUBLIMAZE) injection: INTRAVENOUS | @ 13:00:00 | Stop: 2021-02-20

## 2021-02-20 MED ADMIN — diclofenac sodium (VOLTAREN) 1 % gel 2 g: 2 g | TOPICAL | @ 10:00:00

## 2021-02-20 MED ADMIN — diclofenac sodium (VOLTAREN) 1 % gel 2 g: 2 g | TOPICAL

## 2021-02-20 MED ADMIN — oxyCODONE (ROXICODONE) immediate release tablet 2.5 mg: 2.5 mg | ORAL | @ 13:00:00 | Stop: 2021-02-27

## 2021-02-20 MED ADMIN — midazolam (VERSED) injection: INTRAVENOUS | @ 13:00:00 | Stop: 2021-02-20

## 2021-02-20 MED ADMIN — hydrALAZINE (APRESOLINE) tablet 15 mg: 15 mg | ORAL | @ 18:00:00

## 2021-02-20 MED ADMIN — diclofenac sodium (VOLTAREN) 1 % gel 2 g: 2 g | TOPICAL | @ 16:00:00

## 2021-02-20 MED ADMIN — acetaminophen (TYLENOL) tablet 1,000 mg: 1000 mg | ORAL | @ 10:00:00

## 2021-02-20 MED ADMIN — sertraline (ZOLOFT) tablet 100 mg: 100 mg | ORAL

## 2021-02-20 MED ADMIN — oxyCODONE (ROXICODONE) immediate release tablet 2.5 mg: 2.5 mg | ORAL | @ 18:00:00 | Stop: 2021-02-27

## 2021-02-20 MED ADMIN — acetaminophen (TYLENOL) tablet 1,000 mg: 1000 mg | ORAL | @ 03:00:00

## 2021-02-20 MED ADMIN — acetaminophen (TYLENOL) tablet 1,000 mg: 1000 mg | ORAL | @ 18:00:00

## 2021-02-20 MED ADMIN — diclofenac sodium (VOLTAREN) 1 % gel 2 g: 2 g | TOPICAL | @ 21:00:00

## 2021-02-20 MED ADMIN — hydrALAZINE (APRESOLINE) tablet 15 mg: 15 mg | ORAL | @ 10:00:00

## 2021-02-20 MED ADMIN — lidocaine (LIDODERM) 5 % patch 2 patch: 2 | TRANSDERMAL | @ 03:00:00

## 2021-02-20 MED ADMIN — carvediloL (COREG) tablet 25 mg: 25 mg | ORAL

## 2021-02-20 MED ADMIN — heparin (porcine) 5,000 unit/mL injection 5,000 Units: 5000 [IU] | SUBCUTANEOUS | @ 16:00:00

## 2021-02-20 MED ADMIN — lidocaine (XYLOCAINE) topical spray: @ 13:00:00 | Stop: 2021-02-20

## 2021-02-20 MED ADMIN — potassium chloride (KLOR-CON) packet 30 mEq: 30 meq | ORAL | @ 15:00:00 | Stop: 2021-02-20

## 2021-02-20 MED ADMIN — furosemide (LASIX) tablet 80 mg: 80 mg | ORAL | @ 13:00:00

## 2021-02-20 MED ADMIN — hydrALAZINE (APRESOLINE) tablet 15 mg: 15 mg | ORAL | @ 03:00:00

## 2021-02-20 MED ADMIN — melatonin tablet 3 mg: 3 mg | ORAL

## 2021-02-20 NOTE — Unmapped (Signed)
IMMUNOCOMPROMISED HOST INFECTIOUS DISEASE PROGRESS NOTE    Assessment/Plan:     Cristina Fields is a 70 y.o. female    ID Problem List:  CLL Dx 2012; relapsed in 05/2014, 2018, 04/2020  - Prior chemotherapy: fludarabine, cyclophos, ritux, bendamustine, ibrutinib, chlorambucil  - Current chemotherapy: zanubrutinib - on hold since admission  - Infection Prophylaxis: none     Infection History  # S.Bovis bacteremia 02/13/21  -4/25 BCx (PIV) S.bovis x 1 set (S: ceftri, PCN G, vanc; R: erythro, clinda)  -4/27 BCx BGTD  -4/27 TTE thickening of AV, MV, TV likely degen change, but cannot r/o endocarditis  -5/2 TEE no vegetations     # Encephalopathy favor toxic metabolic 02/13/21, improved  -4/27 MRI brain chronic cerebellar hemorrhages, no acute intracranial hemorrhage    # Significant L clavicular head, L Lower sternocleidomastoid, L supraspinatus tenderness ~ early 01/2021 c/f myositis? Osteo? Abscess?  - 4/29 exam significant tenderness   - 5/2 CT neck Inflammatory stranding surrounding the left shoulder and axilla without organized or drainable fluid collection and mild reactive lymphadenopathy     Pertinent Co-morbidities  T2DM (A1c 5.6; 01/06/21)  ESRD on iHD  Rectal condyloma acuminata with HSIL/AIN3 01/30/21  Cognitive impairment SLUMS score of 15/30 c/t dementia 01/29/21     Drug Intolerances  none         RECOMMENDATIONS FOR 02/20/2021    Diagnostic  ?? Needs outpatient colonoscopy to rule out GI malignancy I/s/o S.bovis BSI. Noted it may be worthwhile to have at least anoscopy performed while she's in-house as she's definitely at high risk of having colorectal cancer  ?? Needs outpatient PAP smear given condylomata  ?? Monitor for toxicity of parenteral antibiotics. Recommend following CBC w/diff, CMP at least weekly while on IV therapy    Treatment  ?? Continue ceftriaxone 2g q24h for now. Duration TBD pending possible workup for L shoulder/clavicle osteo  ?? Will discuss about abnormal findings on CT neck whether MRI is needed tomorrow          The [facility] ID service will continue to follow.  Please page the ID Transplant/Liquid Oncology Fellow consult at [phone] with questions.  Patient discussed with Dr. Raylene Miyamoto    Cristina Fields Cristina Ridges, MD  Fellow, Crist[facility] - [facility] Infectious Diseases    Subjective:     Interval History:    History obtained from:patient and family member.  Afebrile. Denies cough, shortness of breath, abdo pain, diarrhea, N/V. Reports ongoing L clavicular pain/L sternocleidomastoid pain.    Medications:  Antimicrobials:  Anti-infectives (From admission, onward)    Start     Dose/Rate Route Frequency Ordered Stop    02/16/21 1100  cefTRIAXone (ROCEPHIN) 2 g in sodium chloride 0.9 % (NS) 100 mL IVPB-connector bag         2 g  200 mL/hr over 30 Minutes Intravenous Every 24 hours 02/16/21 1021 03/02/21 2359          Prior/Current immunomodulators:  zanubrutinib - on hold now    Other medications reviewed.    Objective:     Vital Signs last 24 hours:  Temp:  [36.3 ??C (97.3 ??F)-36.9 ??C (98.4 ??F)] 36.7 ??C (98.1 ??F)  Heart Rate:  [62-87] 67  Resp:  [11-18] 11  BP: (142-203)/(54-137) 169/63  MAP (mmHg):  [85-145] 91  SpO2:  [93 %-100 %] 99 %    Physical Exam:  Patient Lines/Drains/Airways Status     Active Active Lines, Drains, & Airways     Name Placement date  Placement time Site Days    Peripheral IV 02/20/21 Anterior;Right Forearm 02/20/21  0809  Forearm  less than 1    Hemodialysis Catheter  Right Subclavian ???  ???  Subclavian      Arteriovenous Fistula - Vein Graft  Access Arteriovenous vein graft Left;Upper Arm ???  ???  Arm                Const: lying in bed comfortably, not in acute distress, alert and awake  Eyes: sclerae anicteric and non injected  ENT: no dental carries, no oral thrush, no ulcer  CV: RRR, no peripheral edema.   Resp: normal work of breathing at rest and CTAB anteriorly  GI: soft, NTND  GU: deferred  Skin: no petechiae, ecchymoses or obvious rashes on clothed exam. R chest HD cath c/d/i  MSK: marked tenderness at L clavicular head. Moderate tenderness at lower part of L sternocleidomastoid. moderate tenderness at L supraspinatus  Neuro: alert and oriented x3, apparent word finding difficulties, no tremor noted, facial expression symmetric and moves extremities   Psych: calm, appropriate eye contact    Data for Medical Decision Making     Recent Labs   Lab Units 02/20/21  0541 02/19/21  0903 02/18/21  0602 02/17/21  0445 02/16/21  0658 02/15/21  0600 02/14/21  0523 02/13/21  1747   WBC 10*9/L 10.0 11.3* 12.1* 13.2* 11.7*   < > 13.7*  --    HEMOGLOBIN g/dL 9.5* 16.1* 9.4* 9.1* 9.4*   < > 8.7*  --    PLATELET COUNT (1) 10*9/L 59* 66* 64* 61* 62*   < > 54*  --    NEUTRO ABS 10*9/L 2.3 2.5 2.6 2.3 2.3   < > 2.2  --    LYMPHO ABS 10*9/L 7.0* 8.0* 8.7* 10.2* 9.1*   < >  --   --    EOSINO ABS 10*9/L 0.4 0.4 0.4 0.2 0.1   < >  --   --    BUN mg/dL 20 8* 19 13 25*   < > 41* 33*   CREATININE mg/dL 0.96* 0.45* 4.09* 8.11* 5.85*   < > 8.85* 8.31*   AST U/L 35*  --   --  26  --   --  21 25   ALT U/L 18  --   --  7*  --   --  <7* 8*   BILIRUBIN TOTAL mg/dL 0.2*  --   --  0.3  --   --  0.2* 0.3   ALK PHOS U/L 165*  --   --  99  --   --  74 85   POTASSIUM mmol/L 3.3* 3.9 3.5 3.5 3.7   < > 3.8 4.0   MAGNESIUM mg/dL 2.0 2.0 2.5 1.6 1.8   < > 1.8 2.1   PHOSPHORUS mg/dL 4.5 3.9 4.2 3.0 5.0   < > 4.3  --    CALCIUM mg/dL 9.2 9.4 9.1 8.5* 9.0   < > 8.6* 9.2   CK TOTAL U/L  --   --  <15.0*  --   --   --   --   --     < > = values in this interval not displayed.       New Culture Data      Microbiology Results (last day)       Procedure Component Value Date/Time Date/Time    Blood Culture #1 [[id number]]  (Abnormal) Collected: 02/13/21 1746    Lab Status:  Preliminary result Specimen: Blood from 1 Peripheral Draw Updated: 02/17/21 0804     Blood Culture, Routine Streptococcus bovis group     Comment: Susceptibility Testing By Consultation Only  Processed at Physicians Request        Gram Stain Result Gram positive cocci in chains MADD [[id number]] Collected: 02/15/21 1314    Lab Status: Final result Specimen: Blood from 1 Peripheral Draw Updated: 02/17/21 0800     MADD Reqeust Additional request in process    Micro Add-on [[id number]] Collected: 02/13/21 1746    Lab Status: In process Specimen: Blood from 1 Peripheral Draw Updated: 02/17/21 0758    Blood Culture, Adult [[id number]]  (Normal) Collected: 02/13/21 2207    Lab Status: Preliminary result Specimen: Blood from 1 Peripheral Draw Updated: 02/16/21 2215     Blood Culture, Routine No Growth at 72 hours    Blood Culture, Adult [[id number]] Collected: 02/16/21 1905    Lab Status: In process Specimen: Blood from Central Venous Line Updated: 02/16/21 1910    Blood Culture #1 [[id number]]  (Normal) Collected: 02/15/21 1314    Lab Status: Preliminary result Specimen: Blood from 1 Peripheral Draw Updated: 02/16/21 1345     Blood Culture, Routine No Growth at 24 hours    Blood Culture #2 [[id number]]  (Normal) Collected: 02/15/21 1314    Lab Status: Preliminary result Specimen: Blood from 1 Peripheral Draw Updated: 02/16/21 1345     Blood Culture, Routine No Growth at 24 hours               Recent Studies      No results found.

## 2021-02-20 NOTE — Unmapped (Addendum)
Patient is alert,oriented x 4.Not in pain at this time.NPO for possible procedure today.Fall precaution taken,no acute event noted during shift.    Problem: Adult Inpatient Plan of Care  Goal: Plan of Care Review  Outcome: Progressing  Goal: Patient-Specific Goal (Individualized)  Outcome: Progressing  Goal: Absence of Hospital-Acquired Illness or Injury  Outcome: Progressing  Intervention: Identify and Manage Fall Risk  Recent Flowsheet Documentation  Taken 02/20/2021 0400 by Michelle Nasuti, RN  Safety Interventions:  ??? bed alarm  ??? low bed  ??? lighting adjusted for tasks/safety  ??? environmental modification  ??? fall reduction program maintained  Taken 02/19/2021 2000 by Michelle Nasuti, RN  Safety Interventions:  ??? bed alarm  ??? environmental modification  ??? fall reduction program maintained  ??? low bed  ??? lighting adjusted for tasks/safety  ??? nonskid shoes/slippers when out of bed  Intervention: Prevent and Manage VTE (Venous Thromboembolism) Risk  Recent Flowsheet Documentation  Taken 02/20/2021 0400 by Michelle Nasuti, RN  Activity Management: activity adjusted per tolerance  Taken 02/19/2021 2000 by Michelle Nasuti, RN  VTE Prevention/Management: ambulation promoted  Goal: Optimal Comfort and Wellbeing  Outcome: Progressing  Goal: Readiness for Transition of Care  Outcome: Progressing  Goal: Rounds/Family Conference  Outcome: Progressing     Problem: Self-Care Deficit  Goal: Improved Ability to Complete Activities of Daily Living  Outcome: Progressing     Problem: Fall Injury Risk  Goal: Absence of Fall and Fall-Related Injury  Outcome: Progressing  Intervention: Promote Injury-Free Environment  Recent Flowsheet Documentation  Taken 02/20/2021 0400 by Michelle Nasuti, RN  Safety Interventions:  ??? bed alarm  ??? low bed  ??? lighting adjusted for tasks/safety  ??? environmental modification  ??? fall reduction program maintained  Taken 02/19/2021 2000 by Michelle Nasuti, RN  Safety Interventions:  ??? bed alarm  ??? environmental modification  ??? fall reduction program maintained  ??? low bed  ??? lighting adjusted for tasks/safety  ??? nonskid shoes/slippers when out of bed     Problem: Impaired Wound Healing  Goal: Optimal Wound Healing  Outcome: Progressing  Intervention: Promote Wound Healing  Recent Flowsheet Documentation  Taken 02/20/2021 0400 by Michelle Nasuti, RN  Activity Management: activity adjusted per tolerance     Problem: Device-Related Complication Risk (Hemodialysis)  Goal: Safe, Effective Therapy Delivery  Outcome: Progressing     Problem: Hemodynamic Instability (Hemodialysis)  Goal: Effective Tissue Perfusion  Outcome: Progressing     Problem: Infection (Hemodialysis)  Goal: Absence of Infection Signs and Symptoms  Outcome: Progressing     Problem: Skin Injury Risk Increased  Goal: Skin Health and Integrity  Outcome: Progressing  Intervention: Optimize Skin Protection  Recent Flowsheet Documentation  Taken 02/19/2021 2000 by Michelle Nasuti, RN  Pressure Reduction Techniques: frequent weight shift encouraged  Pressure Reduction Devices: pressure-redistributing mattress utilized

## 2021-02-20 NOTE — Unmapped (Signed)
Hematology Resident (MEDE) Progress Note    Assessment & Plan:   Cristina Fields is a 70 y.o. female with CLL, ESRD on iHD, hypertension, prior cerebellar infarct that presented as a direct admission from infusion clinic with disorientation and pain. Found to have Strep Bovis bacteremia.     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 *    Strep Bovis Bacteremia (1/2 blood cx positive on 4/25): 1/2 Blood Cx positive for Strep Bovis sp. Given immunocompromised state, will treat as true bacteremia.   -CTX 2g daily (4/27 date of clearance, anticipate at least 2 week course)  -f/u repeat blood cultures from 4/27 (NGTD)  -f/u TEE read  -Consider inpatient anoscopy, should have outpatient referral to GI for colonoscopy at discharge.   -Consider if dialysis line needs to be removed  -ICID consulted, appreciate assistance  -Reports colonoscopy performed 1-2 years ago at [facility] - requested records.      Encephalopathy: Previously Grade 3. Worsening likely multifactorial in the setting of bacteremia per above, toxic-metabolic/uremia, and medication/pain driven superimposed on cognitive impairment at baseline (SLUMS 15 as outpatient).   -Treat infection  -Delirium precautions    Left AC OA - Neck Pain: Experiencing some neck pain that changes in location each day on exam.   -f/u CT Neck wo contrast per ID recs to assess for any infectious etiologies.   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 Seen by lower GI surgery this admission with no surgical needs at this time.  -Wound Care consulted, wet to dry dressings  -Will need outpatient pap smear to assess for cervical HPV/malignancy  -Pain control with scheduled tylenol, PRN oxycodone 2.5-5mg  q4h.     CLL - Worsening Leukocytosis with lymphocyte predominance:  Oncology history:  Follows with Dr. Lonni Fix. Diagnosed ~2012 and received fludarabine + cyclophosphamide + rituximab x6. Relapsed in 05/2014 treated with bendamustine + rituximab. Relapsed again in 2018 and started ibrutinib in 5/201. Rapid relapse in 04/2020 and was treated with rituximab and chlorambucil through November, 2021. On zanubrutinib prior to admission.   - Plan to resume zanubrutinib tomorrow following dialysis  - BMBx planned outpatient. Can consider performing this while she is hospitalized.     Anemia - Thrombocytopenia:  -Transfuse for hgb <7, platelets <10,000, higher if actively bleeding    ESRD on iHD - HTN:   -iHD while inpatient  -Coreg 25 mg BID; Nifedipine XL increase to 90 mg daily; Hydralazine 15mg  q8h; Losartan 100mg  daily  -Lasix 80mg  daily    Daily Checklist:  Diet: Regular Diet  DVT PPx: Heparin TID  Electrolytes: Replete Potassium to >/= 3.6 and Magnesium to >/= 1.8  Code Status: Full Code  Dispo: Admit to 4 ONC    Team Contact Information:   Primary Team: Hematology Resident (MEDE)  Primary Resident: Coralee Pesa, MD  Resident's Pager: [phone] (Hematology Intern - Cliffton Asters)    Interval History:   Examined after TEE. Sleepy but awakens. Unable to answer ROS questions given effects of sedation. Still grimaces when pushing over her left clavicle.     ROS otherwise negative.  Objective:   Temp:  [36.3 ??C-36.9 ??C] 36.7 ??C  Heart Rate:  [66-87] 66  Resp:  [16] 16  BP: (142-183)/(54-71) 166/54  SpO2:  [95 %-98 %] 98 %    General: Sleeping in bed after returning from TEE  Neck: Reported tenderness to palpation on left neck lateral to sternal noch and below clavicle, no point tenderness over clavicle. No warmth, redness, or fluctuance of the skin over areas where patient reports pain.   Cardiac: Regular rate and rhythm.    Pulm: Normal work of breathing on RA.  MSK: No LE edema.  Skin: Warm and dry. No rashes. Condyloma on rectum not examined today.   Neuro: Awakens to voice, mouths answers to orientation question of name and year.    Labs/Studies: Labs and Studies from the last 24hrs per EMR and Reviewed

## 2021-02-20 NOTE — Unmapped (Signed)
VENOUS ACCESS TEAM PROCEDURE    Order was placed for a PIV by Venous Access Team (VAT).  Patient was assessed at bedside for placement of a PIV. PPE were donned per protocol.  Access was obtained. Blood return noted.  Dressing intact and device well secured.  Flushed with normal saline.  See LDA for details.  Pt advised to inform RN of any s/s of discomfort at the PIV site.    Able to access vein with each attempted, difficulty threading.    Workup / Procedure Time:  30 minutes       Care RN was notified.       Thank you,     Jacqulyn Liner RN Venous Access Team

## 2021-02-21 DIAGNOSIS — C9112 Chronic lymphocytic leukemia of B-cell type in relapse: Secondary | ICD-10-CM | POA: Diagnosis not present

## 2021-02-21 DIAGNOSIS — B954 Other streptococcus as the cause of diseases classified elsewhere: Secondary | ICD-10-CM | POA: Diagnosis not present

## 2021-02-21 DIAGNOSIS — N186 End stage renal disease: Secondary | ICD-10-CM | POA: Diagnosis not present

## 2021-02-21 DIAGNOSIS — D631 Anemia in chronic kidney disease: Secondary | ICD-10-CM | POA: Diagnosis not present

## 2021-02-21 DIAGNOSIS — B9689 Other specified bacterial agents as the cause of diseases classified elsewhere: Secondary | ICD-10-CM | POA: Diagnosis not present

## 2021-02-21 DIAGNOSIS — Z992 Dependence on renal dialysis: Secondary | ICD-10-CM | POA: Diagnosis not present

## 2021-02-21 DIAGNOSIS — R7881 Bacteremia: Secondary | ICD-10-CM | POA: Diagnosis not present

## 2021-02-21 DIAGNOSIS — R4189 Other symptoms and signs involving cognitive functions and awareness: Secondary | ICD-10-CM | POA: Diagnosis not present

## 2021-02-21 LAB — CBC W/ AUTO DIFF
BASOPHILS ABSOLUTE COUNT: 0.1 10*9/L (ref 0.0–0.1)
BASOPHILS RELATIVE PERCENT: 0.9 %
EOSINOPHILS ABSOLUTE COUNT: 0.4 10*9/L (ref 0.0–0.5)
EOSINOPHILS RELATIVE PERCENT: 4.3 %
HEMATOCRIT: 27 % — ABNORMAL LOW (ref 34.0–44.0)
HEMOGLOBIN: 9.1 g/dL — ABNORMAL LOW (ref 11.3–14.9)
LYMPHOCYTES ABSOLUTE COUNT: 6.8 10*9/L — ABNORMAL HIGH (ref 1.1–3.6)
LYMPHOCYTES RELATIVE PERCENT: 67.3 %
MEAN CORPUSCULAR HEMOGLOBIN CONC: 33.5 g/dL (ref 32.0–36.0)
MEAN CORPUSCULAR HEMOGLOBIN: 27.9 pg (ref 25.9–32.4)
MEAN CORPUSCULAR VOLUME: 83.3 fL (ref 77.6–95.7)
MEAN PLATELET VOLUME: 10.5 fL (ref 6.8–10.7)
MONOCYTES ABSOLUTE COUNT: 0.6 10*9/L (ref 0.3–0.8)
MONOCYTES RELATIVE PERCENT: 5.7 %
NEUTROPHILS ABSOLUTE COUNT: 2.2 10*9/L (ref 1.8–7.8)
NEUTROPHILS RELATIVE PERCENT: 21.8 %
PLATELET COUNT: 66 10*9/L — ABNORMAL LOW (ref 150–450)
RED BLOOD CELL COUNT: 3.24 10*12/L — ABNORMAL LOW (ref 3.95–5.13)
RED CELL DISTRIBUTION WIDTH: 17.2 % — ABNORMAL HIGH (ref 12.2–15.2)
WBC ADJUSTED: 10.2 10*9/L (ref 3.6–11.2)

## 2021-02-21 LAB — BASIC METABOLIC PANEL
ANION GAP: 10 mmol/L (ref 5–14)
BLOOD UREA NITROGEN: 31 mg/dL — ABNORMAL HIGH (ref 9–23)
BUN / CREAT RATIO: 5
CALCIUM: 9.1 mg/dL (ref 8.7–10.4)
CHLORIDE: 101 mmol/L (ref 98–107)
CO2: 24 mmol/L (ref 20.0–31.0)
CREATININE: 5.89 mg/dL — ABNORMAL HIGH
EGFR CKD-EPI AA FEMALE: 8 mL/min/{1.73_m2} — ABNORMAL LOW (ref >=60)
EGFR CKD-EPI NON-AA FEMALE: 7 mL/min/{1.73_m2} — ABNORMAL LOW (ref >=60)
GLUCOSE RANDOM: 123 mg/dL (ref 70–179)
POTASSIUM: 4.1 mmol/L (ref 3.4–4.8)
SODIUM: 135 mmol/L (ref 135–145)

## 2021-02-21 LAB — PHOSPHORUS: PHOSPHORUS: 3.4 mg/dL (ref 2.4–5.1)

## 2021-02-21 LAB — MAGNESIUM: MAGNESIUM: 1.8 mg/dL (ref 1.6–2.6)

## 2021-02-21 MED ADMIN — lidocaine (LIDODERM) 5 % patch 2 patch: 2 | TRANSDERMAL | @ 03:00:00

## 2021-02-21 MED ADMIN — losartan (COZAAR) tablet 100 mg: 100 mg | ORAL | @ 17:00:00

## 2021-02-21 MED ADMIN — NIFEdipine (PROCARDIA XL) 24 hr tablet 90 mg: 90 mg | ORAL | @ 17:00:00

## 2021-02-21 MED ADMIN — heparin (porcine) 5,000 unit/mL injection 5,000 Units: 5000 [IU] | SUBCUTANEOUS | @ 18:00:00

## 2021-02-21 MED ADMIN — diclofenac sodium (VOLTAREN) 1 % gel 2 g: 2 g | TOPICAL | @ 17:00:00

## 2021-02-21 MED ADMIN — hydrALAZINE (APRESOLINE) tablet 15 mg: 15 mg | ORAL | @ 10:00:00

## 2021-02-21 MED ADMIN — diclofenac sodium (VOLTAREN) 1 % gel 2 g: 2 g | TOPICAL | @ 01:00:00

## 2021-02-21 MED ADMIN — carvediloL (COREG) tablet 25 mg: 25 mg | ORAL | @ 01:00:00

## 2021-02-21 MED ADMIN — furosemide (LASIX) tablet 80 mg: 80 mg | ORAL | @ 17:00:00

## 2021-02-21 MED ADMIN — melatonin tablet 3 mg: 3 mg | ORAL | @ 01:00:00

## 2021-02-21 MED ADMIN — paricalcitoL (ZEMPLAR) injection 5 mcg: 5 ug | INTRAVENOUS | @ 16:00:00

## 2021-02-21 MED ADMIN — carvediloL (COREG) tablet 25 mg: 25 mg | ORAL | @ 18:00:00

## 2021-02-21 MED ADMIN — cefTRIAXone (ROCEPHIN) 2 g in sodium chloride 0.9 % (NS) 100 mL IVPB-connector bag: 2 g | INTRAVENOUS | @ 17:00:00 | Stop: 2021-03-02

## 2021-02-21 MED ADMIN — hydrALAZINE (APRESOLINE) tablet 15 mg: 15 mg | ORAL | @ 17:00:00

## 2021-02-21 MED ADMIN — atorvastatin (LIPITOR) tablet 40 mg: 40 mg | ORAL | @ 17:00:00

## 2021-02-21 MED ADMIN — sertraline (ZOLOFT) tablet 100 mg: 100 mg | ORAL | @ 01:00:00

## 2021-02-21 MED ADMIN — acetaminophen (TYLENOL) tablet 1,000 mg: 1000 mg | ORAL | @ 17:00:00

## 2021-02-21 MED ADMIN — acetaminophen (TYLENOL) tablet 1,000 mg: 1000 mg | ORAL | @ 03:00:00

## 2021-02-21 MED ADMIN — epoetin alfa-EPBX (RETACRIT) injection 1,000 Units: 1000 [IU] | INTRAVENOUS | @ 16:00:00

## 2021-02-21 MED ADMIN — acetaminophen (TYLENOL) tablet 1,000 mg: 1000 mg | ORAL | @ 10:00:00

## 2021-02-21 MED ADMIN — heparin (porcine) 5,000 unit/mL injection 5,000 Units: 5000 [IU] | SUBCUTANEOUS | @ 01:00:00

## 2021-02-21 MED ADMIN — hydrALAZINE (APRESOLINE) tablet 15 mg: 15 mg | ORAL | @ 03:00:00

## 2021-02-21 NOTE — Unmapped (Signed)
Vitals within baseline. Disoriented to place. Afebrile. Dressing change completed 2x. BM 2x, smear 1x. Q2 turns completed. No acute events. Pt pleasant and currently resting.    Problem: Adult Inpatient Plan of Care  Goal: Patient-Specific Goal (Individualized)  Outcome: Ongoing - Unchanged     Problem: Adult Inpatient Plan of Care  Goal: Optimal Comfort and Wellbeing  Outcome: Ongoing - Unchanged

## 2021-02-21 NOTE — Unmapped (Signed)
[place] INTERVENTIONAL RADIOLOGY - REMOVAL of Central Venous Access Consultation Note        Subjective:    Requesting Team: Med E    Line to be Removed:  Dialysis - Tunneled    Placement Date: Unknown    Reason for Removal:  2  Bacteremia     Brief History: Cristina Fields is a 70 y.o. female with CLL, rectal condyloma, ESRD on iHD, p/w AMS found to have strep bovis bacteremia.  Patient with indwelling right IJ tunneled hemodialysis catheter.  Placement date unknown.  Patient has a working left upper extremity AV fistula currently being used for dialysis.  Patient seen in consultation at the request of primary care team for consideration for tunneled hemodialysis catheter removal.    History of Long-term/Difficult Access: History of CVC access unknown per medical record.    Objective:      Pertinent Laboratory Values:  WBC   Date Value Ref Range Status   02/21/2021 10.2 3.6 - 11.2 10*9/L Final     HGB   Date Value Ref Range Status   02/21/2021 9.1 (L) 11.3 - 14.9 g/dL Final     HCT   Date Value Ref Range Status   02/21/2021 27.0 (L) 34.0 - 44.0 % Final     Platelet   Date Value Ref Range Status   02/21/2021 66 (L) 150 - 450 10*9/L Final     INR   Date Value Ref Range Status   02/15/2021 1.25  Final     Creatinine   Date Value Ref Range Status   02/21/2021 5.89 (H) 0.60 - 0.80 mg/dL Final       Tip to be Culture:  No     Anticoaguation: Yes  If yes, type of anticoagulation Heparin SQ    Allergies:     Allergies   Allergen Reactions   ??? Lisinopril Cough and Other (See Comments)     Other reaction(s): Unknown       Pediatric Sedation Needed:  No    Assessment:     Cristina Fields is a 70 y.o. female with with medical history per HPI including end-stage renal disease currently dialyzing via left upper extremity fistula.  Patient has indwelling right IJ tunneled hemodialysis catheter placed at outside hospital.  Duration of placement is unknown.  Patient no longer requires catheter as fistula is functioning well.  Given history of bacteremia team like to proceed with tunneled catheter removal.   pertinent history, imaging and laboratory values in patient's medical record have been reviewed.    Plan/Recommendations:       VIR does recommend proceeding with Dialysis - Tunneled line removal.    -Anticipated Date:  Tomorrow (patient is currently on dialysis and not pending discharge)    Burney Gauze, FNP, Feb 21, 2021, 11:58 AM

## 2021-02-21 NOTE — Unmapped (Signed)
3.5 hours HD treatment, no UF removal, patient below EDW, AVG with good thrill & bruit, cannulated without difficulty, crit line monitor on.  Problem: Hemodynamic Instability (Hemodialysis)  Goal: Effective Tissue Perfusion  Outcome: Ongoing - Unchanged     Problem: Infection (Hemodialysis)  Goal: Absence of Infection Signs and Symptoms  Outcome: Ongoing - Unchanged

## 2021-02-21 NOTE — Unmapped (Signed)
Pt alert and oriented x3, disoriented to location (stated [place]). VSS, no falls and afebrile. No acute events. Pt received PRN oxy 2.5mg  for pain, see MAR. Pt went down for TEE procedure this AM, and CT of neck this afternoon. Q2 turns maintained, sacral/rectal dressing changed/area cleansed every 2 hours with turns to keep free of stool build-up. CHG bath completed. Safety measures remain in place with bed low, brakes locked, side rails up, call bell within reach, non-skid footwear when out of bed. Will continue to monitor.

## 2021-02-21 NOTE — Unmapped (Signed)
Hematology Resident (MEDE) Progress Note    Assessment & Plan:   Cristina Fields is a 70 y.o. female with CLL, ESRD on iHD, hypertension, prior cerebellar infarct that presented as a direct admission from infusion clinic with disorientation and pain. Found to have Strep Bovis bacteremia.     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 *    Strep Bovis Bacteremia (1/2 blood cx positive on 4/25): 1/2 Blood Cx positive for Strep Bovis sp. Given immunocompromised state, will treat as true bacteremia. TEE negative for endocarditis.   -CTX 2g daily (4/27 date of clearance, anticipate at least 2 week course)  -Consider inpatient anoscopy, should have outpatient referral to GI for colonoscopy at discharge.   -Consider if dialysis line needs to be removed  -ICID consulted, appreciate assistance  -Colonoscopy performed at [facility] 12/2018 with one polyp removed and was otherwise normal.       Encephalopathy: Previously Grade 3. Worsening likely multifactorial in the setting of bacteremia per above, toxic-metabolic/uremia, and medication/pain driven superimposed on cognitive impairment at baseline (SLUMS 15 as outpatient).   -Treat infection  -Delirium precautions    Left AC OA - Neck Pain: Experiencing some neck pain that changes in location each day on exam. CT scan showed only non-specific inflammatory stranding in the fat surrounding the left shoulder and axilla.   -Considering if MRI or further imaging of left neck is indicated.   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 Seen by lower GI surgery this admission with no surgical needs at this time.  -Wound Care consulted, wet to dry dressings  -Will need outpatient pap smear to assess for cervical HPV/malignancy  -Pain control with scheduled tylenol, PRN oxycodone 2.5-5 mg q4h for severe pain.     CLL - Worsening Leukocytosis with lymphocyte predominance:  Oncology history:  Follows with Dr. Lonni Fix. Diagnosed ~2012 and received fludarabine + cyclophosphamide + rituximab x6. Relapsed in 05/2014 treated with bendamustine + rituximab. Relapsed again in 2018 and started ibrutinib in 5/201. Rapid relapse in 04/2020 and was treated with rituximab and chlorambucil through November, 2021. On zanubrutinib prior to admission.   - Plan to resume zanubrutinib today  - BMBx planned outpatient. Can consider performing this while she is hospitalized.     Anemia - Thrombocytopenia:  -Transfuse for hgb <7, platelets <10,000, higher if actively bleeding    ESRD on iHD - HTN: TThSat Dialysis. Continues to remain hypertensive despite up-titration of home meds while admitted.  -iHD while inpatient  -Coreg 25 mg BID; Nifedipine XL 90 mg daily; Hydralazine 15mg  q8h; Losartan 100mg  daily  -Lasix 80mg  daily    Daily Checklist:  Diet: Regular Diet  DVT PPx: Heparin TID  Electrolytes: Replete Potassium to >/= 3.6 and Magnesium to >/= 1.8  Code Status: Full Code  Dispo: Admit to 4 ONC    Team Contact Information:   Primary Team: Hematology Resident (MEDE)  Primary Resident: Coralee Pesa, MD  Resident's Pager: [phone] (Hematology Intern - Cliffton Asters)    Interval History:   No acute complaints this morning. Able to state her name, the year, and knows  she is in the hospital. Pain in her rectal area and neck continues, but states neck pain better today than prior. Getting close to discharge to SNF.     ROS otherwise negative.     Objective:   Temp:  [36.4 ??C-36.9 ??C] 36.7 ??C  Heart Rate:  [61-84] 76  Resp:  [11-18] 16  BP: (136-203)/(53-137) 193/70  SpO2:  [92 %-100 %] 95 %    General: Lying in bed in NAD, needing to have a bowel movement.   Neck: Reported tenderness to palpation on left neck lateral to sternal noch and below clavicle, no point tenderness over clavicle. No warmth, redness, or fluctuance of the skin over areas where patient reports pain.   Cardiac: Regular rate and rhythm. No m/r/g.  Pulm: Normal work of breathing on RA. CTAB in anterior fields.   MSK: No LE edema.  Skin: Warm and dry. No rashes. Condyloma on rectum stable, biopsy site with dressing in place.   Neuro: Oriented to person, place, and year. Mental status improving from prior exams today.     Labs/Studies: Labs and Studies from the last 24hrs per EMR and Reviewed

## 2021-02-21 NOTE — Unmapped (Signed)
HEMODIALYSIS NURSE PROCEDURE NOTE       Treatment Number:  4 Room / Station:  2    Procedure Date:  02/21/21 Device Name/Number: EMMITT    Total Dialysis Treatment Time:  213 Min.    CONSENT:    Written consent was obtained prior to the procedure and is detailed in the medical record.  Prior to the start of the procedure, a time out was taken and the identity of the patient was confirmed via name, medical record number and date of birth.     WEIGHT:  Hemodialysis Pre-Treatment Weights     Date/Time Pre-Treatment Weight (kg) Estimated Dry Weight (kg) Patient Goal Weight (kg) Total Goal Weight (kg)    02/21/21 0846 48.6 kg (107 lb 2.3 oz) 49.5 kg (109 lb 2 oz) 0 kg (0 lb) 0.55 kg (1 lb 3.4 oz)         Hemodialysis Post Treatment Weights     Date/Time Post-Treatment Weight (kg) Treatment Weight Change (kg)    02/21/21 1240 47.6 kg (104 lb 15 oz) -1 kg        Active Dialysis Orders (168h ago, onward)     Start     Ordered    02/16/21 0700  Hemodialysis inpatient  Every Tue,Thu,Sat      Comments: RIJ IS NOT A DIALYSIS ACCESS   Question Answer Comment   K+ 3 meq/L    Ca++ 2.5 meq/L    Bicarb 35 meq/L    Na+ 137 meq/L    Na+ Modeling no    Dialyzer [id number]    Dialysate Temperature (C) 37    BFR-As tolerated to a maximum of: 500 mL/min    DFR 800 mL/min    Duration of treatment 3.5 Hr    Dry weight (kg) 49.5 kg    Challenge dry weight (kg) no    Fluid removal (L) to edw    Tubing Adult = 142 ml    Access Site AVG    Access Site Location Left    Keep SBP >: 95        02/14/21 1040              ASSESSMENT:  General appearance: alert  Neurologic: oriented  Lungs: diminished breath sounds bilaterally  Heart: S1, S2 normal  Abdomen: soft, non-tender; bowel sounds normal; no masses,  no organomegaly  Pulses: 2+ and symmetric.  Skin: Skin color, texture, turgor normal. No rashes or lesions    ACCESS SITE:       Hemodialysis Catheter  Right Subclavian (Active)   Site Assessment Clean;Dry;Intact 02/21/21 1240   Proximal Lumen Status / Patency Capped 03-08-2021 Apr 26, 2047   Proximal Lumen Intervention Capped - dead end 03-08-2021 04/26/47   Medial Lumen Status / Patency Capped 2021-03-08 2048   Medial Lumen Intervention Capped - dead end 2021-03-08 04-26-2047   Dressing Intervention No intervention needed 03/08/2021 04-26-2047   Dressing Status      Clean;Dry;Intact/not removed 02/21/21 1240   Site Condition No complications 03-08-2021 2047/04/26   Dressing Type CHG gel;Occlusive;Transparent 03-08-2021 04-26-2047   Dressing Change Due 02/21/21 03/08/21 04/26/47   Line Necessity Reviewed? Y 2021-03-08 26-Apr-2047   Line Necessity Indications Yes - Hemodialysis 03-08-2021 26-Apr-2047   Line Necessity Reviewed With MEDE Mar 08, 2021 04/26/2047        Arteriovenous Fistula - Vein Graft  Access Arteriovenous vein graft Left;Upper Arm (Active)   Site Assessment Clean;Dry;Intact 02/21/21 1240   AV Fistula Thrill Present;Bruit Present 02/21/21 1240   Status  Deaccessed 02/21/21 1240   Dressing Intervention New dressing 02/21/21 1240   Dressing Status      Clean;Dry;Intact/not removed 02/21/21 1240   Site Condition No complications 02/21/21 1240   Dressing Gauze 02/21/21 1240   Dressing To Be Removed (Date/Time) 4-6 hrs post HD, 5/3 at 19:00PM 02/21/21 1240        Patient Lines/Drains/Airways Status     Active Peripheral & Central Intravenous Access     Name Placement date Placement time Site Days    Peripheral IV 02/20/21 Anterior;Right Forearm 02/20/21  0809  Forearm  1               LAB RESULTS:  Lab Results   Component Value Date    NA 135 02/21/2021    K 4.1 02/21/2021    CL 101 02/21/2021    CO2 24.0 02/21/2021    BUN 31 (H) 02/21/2021    CREATININE 5.89 (H) 02/21/2021    GLU 123 02/21/2021    CALCIUM 9.1 02/21/2021    PHOS 3.4 02/21/2021    MG 1.8 02/21/2021    IRON 35 (L) 02/13/2021    LABIRON 21 02/13/2021    FERRITIN 2,051.1 (H) 02/13/2021    TIBC 163 (L) 02/13/2021     Lab Results   Component Value Date    WBC 10.2 02/21/2021    HGB 9.1 (L) 02/21/2021    HCT 27.0 (L) 02/21/2021    PLT 66 (L) 02/21/2021    APTT 33.0 02/15/2021        VITAL SIGNS:   Temperature     Date/Time Temp Temp src       02/21/21 1313 36.7 ??C (98.1 ??F) Oral     02/21/21 1240 35.6 ??C (96 ??F) Temporal         Hemodynamics     Date/Time Pulse BP MAP (mmHg) Patient Position    02/21/21 1313 81 183/65 98 Lying    02/21/21 1240 86 164/72 117 Lying    02/21/21 1235 86 166/78 ??? Lying    02/21/21 1215 ??? 171/72 ??? ???    02/21/21 1200 73 203/84 ??? Lying    02/21/21 1130 74 168/65 ??? Lying    02/21/21 1100 78 173/77 ??? Lying    02/21/21 1030 80 190/80 ??? Lying    02/21/21 1000 72 187/85 ??? Lying    02/21/21 0930 76 192/77 ??? Lying        Blood Volume Monitor     Date/Time Blood Volume Change (%) HCT HGB Critline O2 SAT %    02/21/21 1235 -19 % 33.9 11.5 97.8    02/21/21 1215 -13.5 % 31.7 10.8 97.5    02/21/21 1208 -11 % 30.8 10.5 97.7    02/21/21 1200 -10.5 % 30.6 10.4 98    02/21/21 1130 -9.4 % 30.3 10.3 98    02/21/21 1100 -10.1 % 30.5 10.4 97.3    02/21/21 1030 -9.7 % 30.4 10.3 96.8    02/21/21 1000 -8.1 % 29.8 10.1 97.7    02/21/21 0930 -6 % 29.2 9.9 96.9        Oxygen Therapy     Date/Time Resp SpO2 O2 Device FiO2 (%) O2 Flow Rate (L/min)    02/21/21 1313 18 99 % None (Room air) -- --    02/21/21 1240 18 ??? None (Room air) -- --    02/21/21 1235 18 ??? None (Room air) -- --    02/21/21 1200 18 ??? None (Room air) -- --  02/21/21 1130 ??? ??? None (Room air) -- --    02/21/21 1100 18 ??? None (Room air) -- --    02/21/21 1030 18 ??? None (Room air) -- --    02/21/21 1000 ??? ??? None (Room air) -- --    02/21/21 0930 18 ??? None (Room air) -- --          Pre-Hemodialysis Assessment     Date/Time Therapy Number Dialyzer Hemodialysis Line Type All Machine Alarms Passed    02/21/21 0846 4 F-180 (98 mLs) Adult (142 m/s) Yes    Date/Time Air Detector Saline Line Double Clampled Hemo-Safe Applied Dialysis Flow (mL/min)    02/21/21 0846 Engaged ??? ??? 800 mL/min    Date/Time Verify Priming Solution Priming Volume Hemodialysis Independent pH Hemodialysis Machine Conductivity (mS/cm)    02/21/21 0846 0.9% NS 300 mL ???  passed 13.8 mS/cm    Date/Time Hemodialysis Independent Conductivity (mS/cm) Bicarb Conductivity Residual Bleach Negative Total Chlorine    02/21/21 0846 13.6 mS/cm -- Yes 0        Pre-Hemodialysis Treatment Comments     Date/Time Pre-Hemodialysis Comments    02/21/21 0846 stable for HD        Hemodialysis Treatment     Date/Time Blood Flow Rate (mL/min) Arterial Pressure (mmHg) Venous Pressure (mmHg) Transmembrane Pressure (mmHg)    02/21/21 1235 200 mL/min 50 mmHg 172 mmHg 57 mmHg    02/21/21 1215 450 mL/min -131 mmHg 247 mmHg 59 mmHg    02/21/21 1208 450 mL/min -127 mmHg 258 mmHg 36 mmHg    02/21/21 1200 450 mL/min -116 mmHg 269 mmHg 36 mmHg    02/21/21 1130 450 mL/min -142 mmHg 243 mmHg 37 mmHg    02/21/21 1100 450 mL/min -117 mmHg 265 mmHg 35 mmHg    02/21/21 1030 450 mL/min -124 mmHg 254 mmHg 42 mmHg    02/21/21 1000 450 mL/min -126 mmHg 245 mmHg 41 mmHg    02/21/21 0930 450 mL/min -116 mmHg 246 mmHg 38 mmHg    02/21/21 0902 450 mL/min -120 mmHg 230 mmHg 40 mmHg    Date/Time Ultrafiltration Rate (mL/hr) Ultrafiltrate Removed (mL) Dialysate Flow Rate (mL/min) KECN (Kecn)    02/21/21 1235 0 mL/hr 1050 mL 800 ml/min ???    02/21/21 1215 1290 mL/hr 611 mL 800 ml/min ???    02/21/21 1208 160 mL/hr 487 mL 800 ml/min ???    02/21/21 1200 160 mL/hr 466 mL 800 ml/min ???    02/21/21 1130 160 mL/hr 388 mL 800 ml/min ???    02/21/21 1100 160 mL/hr 307 mL 800 ml/min ???    02/21/21 1030 160 mL/hr 230 mL 800 ml/min ???    02/21/21 1000 160 mL/hr 152 mL 800 ml/min ???    02/21/21 0930 160 mL/hr 70 mL 800 ml/min ???    02/21/21 0902 160 mL/hr 0 mL 800 ml/min ???        Hemodialysis Treatment Comments     Date/Time Intra-Hemodialysis Comments    02/21/21 1235 tx completed    02/21/21 1208 kidney fellow notified re HTN, ordered to increase UF goal to 500 ml net as tol    02/21/21 1200 pt. alert, denies any complaints    02/21/21 1130 tolerating tx well    02/21/21 1100 VSS    02/21/21 1030 tolerating tx well    02/21/21 1000 pt. resting, yes closed    02/21/21 0930 NAD    02/21/21 0902 tx initiated  Post Treatment     Date/Time Rinseback Volume (mL) On Line Clearance: spKt/V Total Liters Processed (L/min) Dialyzer Clearance    02/21/21 1240 300 mL ??? 91.1 L/min Moderately streaked        Post Hemodialysis Treatment Comments     Date/Time Post-Hemodialysis Comments    02/21/21 1240 alert, stable, NAD, denies any complaints        Hemodialysis I/O     Date/Time Total Hemodialysis Replacement Volume (mL) Total Ultrafiltrate Output (mL)    02/21/21 1240 ??? 500 mL          [id number] - Medicaitons Given During Treatment  (last 4 hrs)         Savilla Turbyfill Bonney Leitz, RN       Medication Name Action Time Action Route Rate Dose User     epoetin alfa-EPBX (RETACRIT) injection 1,000 Units 02/21/21 1134 Given Intravenous  1,000 Units Jacalyn Lefevre K Villa Burgin, RN     paricalcitoL [facility]) injection 5 mcg 02/21/21 1135 Given Intravenous  5 mcg Jacalyn Lefevre K Diamon Reddinger, RN          RACHELLE Feliberto Harts, RN       Medication Name Action Time Action Route Rate Dose User     NIFEdipine (PROCARDIA XL) 24 hr tablet 90 mg 02/21/21 1325 Given Oral  90 mg Oretha Ellis, RN     acetaminophen (TYLENOL) tablet 1,000 mg 02/21/21 1325 Given Oral  1,000 mg Oretha Ellis, RN     atorvastatin (LIPITOR) tablet 40 mg 02/21/21 1325 Given Oral  40 mg Oretha Ellis, RN     cefTRIAXone (ROCEPHIN) 2 g in sodium chloride 0.9 % (NS) 100 mL IVPB-connector bag 02/21/21 1318 New Bag Intravenous 200 mL/hr 2 g Oretha Ellis, RN     diclofenac sodium (VOLTAREN) 1 % gel 2 g 02/21/21 1324 Given Topical  2 g Oretha Ellis, RN     furosemide (LASIX) tablet 80 mg 02/21/21 1325 Given Oral  80 mg Oretha Ellis, RN     hydrALAZINE (APRESOLINE) tablet 15 mg 02/21/21 1324 Given Oral  15 mg Oretha Ellis, RN     losartan (COZAAR) tablet 100 mg 02/21/21 1325 Given Oral  100 mg Oretha Ellis, RN                  Patient tolerated treatment in a Bed.

## 2021-02-21 NOTE — Unmapped (Signed)
[facility] Nephrology Hemodialysis Procedure Note     02/21/2021    Cristina Fields was seen and examined on hemodialysis    CHIEF COMPLAINT: End Stage Renal Disease     INTERVAL HISTORY: Using left upper arm AVF today. Tolerating procedure. Came in at [facility].       DIALYSIS TREATMENT DATA:  Estimated Dry Weight (kg): 49.5 kg (109 lb 2 oz)  Patient Goal Weight (kg): 0 kg (0 lb)  Dialyzer: F-180 (98 mLs)  Dialysis Bath  Bath: 3 K+ / 2.5 Ca+  Dialysate Na (mEq/L): 137 mEq/L  Dialysate HCO3 (mEq/L): 35 mEq/L  Dialysate Total Buffer HCO3 (mEq/L): 35 mEq/L  Blood Flow Rate (mL/min): 450 mL/min  Dialysis Flow (mL/min): 800 mL/min    PHYSICAL EXAM:  Vitals:  Temp:  [36.2 ??C (97.1 ??F)-36.9 ??C (98.4 ??F)] 36.2 ??C (97.1 ??F)  Heart Rate:  [70-84] 73  BP: (136-203)/(53-85) 171/72  MAP (mmHg):  [91-107] 107  Weights:  Pre-Treatment Weight (kg): 48.6 kg (107 lb 2.3 oz)    General: Appearing in no acute distress  Pulmonary: clear to auscultation  Cardiovascular: regular rate and rhythm  Extremities: no significant  edema  Access: LUE AV graft   Dialyzing in bed    LAB DATA:  Lab Results   Component Value Date    NA 135 02/21/2021    K 4.1 02/21/2021    CL 101 02/21/2021    CO2 24.0 02/21/2021    BUN 31 (H) 02/21/2021    CREATININE 5.89 (H) 02/21/2021      Lab Results   Component Value Date    HCT 27.0 (L) 02/21/2021    WBC 10.2 02/21/2021        ASSESSMENT/PLAN:  End Stage Renal Disease on Intermittent Hemodialysis:  - UF goal: net even as tolerated  - Obtain post weight today to establish new dry weight   - Adjust medications for a GFR <10  - Avoid nephrotoxic agents     Bone Mineral Metabolism:  Lab Results   Component Value Date    CALCIUM 9.1 02/21/2021    CALCIUM 9.2 02/20/2021    Lab Results   Component Value Date    ALBUMIN 2.7 (L) 02/20/2021    ALBUMIN 2.9 (L) 02/17/2021      Lab Results   Component Value Date    PHOS 3.4 02/21/2021    PHOS 4.5 02/20/2021    No results found for: PTH   - Continue phosphorus binder and dietary counseling.    Anemia:   Lab Results   Component Value Date    HGB 9.1 (L) 02/21/2021    HGB 9.5 (L) 02/20/2021    HGB 10.0 (L) 02/19/2021    Iron Saturation (%)   Date Value Ref Range Status   02/13/2021 21 % Final      Lab Results   Component Value Date    FERRITIN 2,051.1 (H) 02/13/2021       Continue intravenous Epogen/Retacrit 1K units with each treatment.       Archie Balboa, MD  [facility] Division of Nephrology & Hypertension

## 2021-02-21 NOTE — Unmapped (Signed)
IMMUNOCOMPROMISED HOST INFECTIOUS DISEASE PROGRESS NOTE    Assessment/Plan:     Cristina Fields is a 70 y.o. female    ID Problem List:  CLL Dx 2012; relapsed in 05/2014, 2018, 04/2020  - Prior chemotherapy: fludarabine, cyclophos, ritux, bendamustine, ibrutinib, chlorambucil  - Current chemotherapy: zanubrutinib - on hold since admission  - Infection Prophylaxis: none     Infection History  # S.Bovis bacteremia 02/13/21  -4/25 BCx (PIV) S.bovis x 1 set (S: ceftri, PCN G, vanc; R: erythro, clinda)  -4/27 BCx BGTD  -4/27 TTE thickening of AV, MV, TV likely degen change, but cannot r/o endocarditis  -5/2 TEE no vegetations     # Encephalopathy favor toxic metabolic 02/13/21, improved  -4/27 MRI brain chronic cerebellar hemorrhages, no acute intracranial hemorrhage    # Significant L clavicular head, L Lower sternocleidomastoid, L supraspinatus tenderness ~ early 01/2021 with CT showed non-specific inflammation  - 4/29 exam significant tenderness   - 5/2 CT neck Inflammatory stranding surrounding the left shoulder and axilla without organized or drainable fluid collection and mild reactive lymphadenopathy  - 5/3 discussed with [facility] rads who didn't feel the findings c/w osteomyelitis, probably non-specific inflammation     Pertinent Co-morbidities  T2DM (A1c 5.6; 01/06/21)  ESRD on iHD  Rectal condyloma acuminata with HSIL/AIN3 01/30/21  Cognitive impairment SLUMS score of 15/30 c/t dementia 01/29/21     Drug Intolerances  none         RECOMMENDATIONS FOR 02/21/2021    Diagnostic  ?? Needs outpatient colonoscopy to rule out GI malignancy I/s/o S.bovis BSI.   ?? Needs outpatient PAP smear given condylomata  ?? Monitor for toxicity of parenteral antibiotics. Recommend following CBC w/diff, CMP at least weekly while on IV therapy    Treatment  ?? Continue ceftriaxone 2g q24h (more convenience with HD schedule than PCN G)  ?? Anticipate 2 wks course from negative BCx (4/27-5/10)          The [facility] ID service will sign off  Please page the ID Transplant/Liquid Oncology Fellow consult at [phone] with questions.  Patient discussed with Dr. Mila Homer    Azariel Banik [name], MD  Fellow, [facility] Infectious Diseases    Hematologic Malignancies and BMT Infectious Diseases Follow-up Instructions  - Appointment: 2 weeks  - Location: 2nd Floor, [facility],[address] [place], [place]  - Labs: weekly CBC with differential, CMP  - Please fax labs to primary oncologist???s nurse navigator  - Antibiotics:   ?? ceftriaxone 2g q24h, 2 weeks (4/27-5/10)    Subjective:     Interval History:    History obtained from:patient and family member.  Afebrile. Reports doing well. Getting HD while encounter. Reports persistent L clavicular and L shoulder pain. Denies rash, diarrhea.    Medications:  Antimicrobials:  Anti-infectives (From admission, onward)    Start     Dose/Rate Route Frequency Ordered Stop    02/16/21 1100  cefTRIAXone (ROCEPHIN) 2 g in sodium chloride 0.9 % (NS) 100 mL IVPB-connector bag         2 g  200 mL/hr over 30 Minutes Intravenous Every 24 hours 02/16/21 1021 03/02/21 2359          Prior/Current immunomodulators:  zanubrutinib - on hold now    Other medications reviewed.    Objective:     Vital Signs last 24 hours:  Temp:  [36.4 ??C (97.5 ??F)-36.9 ??C (98.4 ??F)] 36.7 ??C (98.1 ??F)  Heart Rate:  [61-84] 76  Resp:  [11-18] 16  BP: (136-203)/(53-137) 193/70  MAP (mmHg):  [90-145] 107  SpO2:  [92 %-100 %] 95 %    Physical Exam:  Patient Lines/Drains/Airways Status     Active Active Lines, Drains, & Airways     Name Placement date Placement time Site Days    Peripheral IV 02/20/21 Anterior;Right Forearm 02/20/21  0809  Forearm  1    Hemodialysis Catheter  Right Subclavian ???  ???  Subclavian      Arteriovenous Fistula - Vein Graft  Access Arteriovenous vein graft Left;Upper Arm ???  ???  Arm                Const: lying in bed comfortably, not in acute distress, alert and awake  Eyes: sclerae anicteric and non injected  ENT: no dental carries, no oral thrush, no ulcer  CV: RRR, no peripheral edema.   Resp: normal work of breathing at rest and CTAB anteriorly  GI: soft, NTND  GU: deferred  Skin: no petechiae, ecchymoses or obvious rashes on clothed exam. R chest HD cath c/d/i  MSK: marked tenderness at L clavicular head. Moderate tenderness at lower part of L sternocleidomastoid. moderate tenderness at L supraspinatus  Neuro: alert and oriented x3, apparent word finding difficulties, no tremor noted, facial expression symmetric and moves extremities   Psych: calm, appropriate eye contact    Data for Medical Decision Making     Recent Labs   Lab Units 02/20/21  0541 02/19/21  0903 02/18/21  0602 02/17/21  0445 02/16/21  0658   WBC 10*9/L 10.0 11.3* 12.1* 13.2* 11.7*   HEMOGLOBIN g/dL 9.5* 91.4* 9.4* 9.1* 9.4*   PLATELET COUNT (1) 10*9/L 59* 66* 64* 61* 62*   NEUTRO ABS 10*9/L 2.3 2.5 2.6 2.3 2.3   LYMPHO ABS 10*9/L 7.0* 8.0* 8.7* 10.2* 9.1*   EOSINO ABS 10*9/L 0.4 0.4 0.4 0.2 0.1   BUN mg/dL 20 8* 19 13 25*   CREATININE mg/dL 7.82* 9.56* 2.13* 0.86* 5.85*   AST U/L 35*  --   --  26  --    ALT U/L 18  --   --  7*  --    BILIRUBIN TOTAL mg/dL 0.2*  --   --  0.3  --    ALK PHOS U/L 165*  --   --  99  --    POTASSIUM mmol/L 3.3* 3.9 3.5 3.5 3.7   MAGNESIUM mg/dL 2.0 2.0 2.5 1.6 1.8   PHOSPHORUS mg/dL 4.5 3.9 4.2 3.0 5.0   CALCIUM mg/dL 9.2 9.4 9.1 8.5* 9.0   CK TOTAL U/L  --   --  <15.0*  --   --        New Culture Data      Microbiology Results (last day)       Procedure Component Value Date/Time Date/Time    Blood Culture #1 [[id number]]  (Abnormal) Collected: 02/13/21 1746    Lab Status: Preliminary result Specimen: Blood from 1 Peripheral Draw Updated: 02/17/21 0804     Blood Culture, Routine Streptococcus bovis group     Comment: Susceptibility Testing By Consultation Only  Processed at Physicians Request        Gram Stain Result Gram positive cocci in chains    MADD [[id number]] Collected: 02/15/21 1314    Lab Status: Final result Specimen: Blood from 1 Peripheral Draw Updated: 02/17/21 0800     MADD Reqeust Additional request in process    Micro Add-on [[id number]] Collected: 02/13/21 1746  Lab Status: In process Specimen: Blood from 1 Peripheral Draw Updated: 02/17/21 0758    Blood Culture, Adult [[id number]]  (Normal) Collected: 02/13/21 2207    Lab Status: Preliminary result Specimen: Blood from 1 Peripheral Draw Updated: 02/16/21 2215     Blood Culture, Routine No Growth at 72 hours    Blood Culture, Adult [[id number]] Collected: 02/16/21 1905    Lab Status: In process Specimen: Blood from Central Venous Line Updated: 02/16/21 1910    Blood Culture #1 [[id number]]  (Normal) Collected: 02/15/21 1314    Lab Status: Preliminary result Specimen: Blood from 1 Peripheral Draw Updated: 02/16/21 1345     Blood Culture, Routine No Growth at 24 hours    Blood Culture #2 [[id number]]  (Normal) Collected: 02/15/21 1314    Lab Status: Preliminary result Specimen: Blood from 1 Peripheral Draw Updated: 02/16/21 1345     Blood Culture, Routine No Growth at 24 hours               Recent Studies      Echocardiogram Transesophageal Echo    Result Date: 02/20/2021  Patient Info Name:     NIDIA GROGAN Age:     61 years DOB:     1951-06-22 Gender:     Female MRN:     [id number] Accession #:     [id number] UN Ht:     150 cm Wt:     50 kg BSA:     1.45 m2 Technical Quality:     Fair Exam Date:     02/20/2021 9:06 AM Exam Location:     [facility] Admit Date:     02/13/2021 Exam Type:     ECHOCARDIOGRAM TRANSESOPHAGEAL ECHO Study Info Indications      - r/o endocarditis Complete two-dimensional, color flow and Doppler transesophageal study is performed. Staff Referring Physician:     Lenon Ahmadi ; Reading Fellow:     Alla German MD Sonographer:     Edwena Bunde Ordering Physician:     Kerin Perna Account #:     [id number] Summary   1. TEE to evaluate for vegetation.   2. Degenerative mitral valve disease with associated mild mitral valve regurgitation.   3. The aortic valve is trileaflet with mildly thickened leaflets with normal excursion.   4. The left atrium is dilated in size.   5. There is mild pulmonary hypertension, estimated pulmonary artery systolic pressure is approximately 40 mmHg.   6. Patent foramen ovale (PFO) with trivial left to right shunting by 2D and color flow.   7. No vegetations noted. Anesthesia/Analgesia   The patient received moderate sedation.   I personally spent 30 minutes continuously monitoring the patient during the administration of moderate sedation.   The patient was premedicated with 2.00 mg intravenous Versed.   The patient was premedicated with 50.00 mcg intravenous Fentanyl.   The patient was administered viscous Xylocaine 2% 5ml gargled and swallowed. Procedure Details   The patient tolerated the procedure well and there were no complications.   There was no difficulty inserting the probe.   Number of insertion attempts: 1.   The probe was inserted by the cardiologist. Left Atrium   The left atrium is dilated in size. Right Atrium   There is a catheter present.   No vegetation noted on visualized portion of catheter. Of note the catheter tip is deep, nearing IVC/RA junction. Atrial Septum   Patent foramen ovale (PFO) with trivial left to right shunting by 2D and  color flow. Aortic Valve   The aortic valve is trileaflet with mildly thickened leaflets with normal excursion.   There is trivial aortic regurgitation. Pulmonic Valve   The pulmonic valve is poorly visualized, but probably normal.   There is trivial pulmonic regurgitation. Mitral Valve   The mitral valve leaflets are mildly thickened with normal leaflet mobility.   Degenerative mitral valve disease with associated mild mitral valve regurgitation. Tricuspid Valve   The tricuspid valve leaflets are normal, with normal leaflet mobility.   There is mild to moderate tricuspid regurgitation.   There is mild pulmonary hypertension, estimated pulmonary artery systolic pressure is approximately 40 mmHg. Aorta   Moderate plaque in the ascending of the aorta.   The aorta is normal in size in the visualized segments. Tricuspid Valve ---------------------------------------------------------------------- Name                                 Value        Normal ---------------------------------------------------------------------- TV Regurgitation Doppler ---------------------------------------------------------------------- TR Peak Velocity                     3 m/s Report Signatures Resident Alla German  MD on 02/20/2021 12:24 PM    CT Neck Soft Tissue Wo Contrast    Result Date: 02/20/2021  EXAM: Computed tomography, soft tissue neck without contrast material. DATE: 02/20/2021 2:11 PM ACCESSION: [id number] UN DICTATED: 02/20/2021 2:21 PM INTERPRETATION LOCATION: [facility] CLINICAL INDICATION: 70 years old Female with Left sided neck and shoulder pain  -  Strep Bovis bacteremia  -  Concern for infectious myositis vs osteomyelitis v soft tissue infection  COMPARISON: Brain MRI 02/15/2021, head CT 02/14/2021, PET/CT 10/04/2020 TECHNIQUE: Axial CT images of the neck from the skull base through the thoracic inlet. Coronal and sagittal reformatted images, bone and soft tissue algorithm are provided. FINDINGS: Calcification in the right cerebellar hemisphere is unchanged. The paranasal sinuses are normal. The orbits are normal. The nasal cavity and nasopharynx are normal. The oropharynx and oral cavity are normal. The parapharyngeal spaces are clear. The salivary glands are normal. The larynx and hypopharynx are normal. There is a 6 cm left level IIB node (6-49). There is no pathologic lymphadenopathy in the neck. There are prominent left axillary lymph nodes measuring up to 1.0 cm, likely reactive. The thyroid gland is normal. There is inflammatory stranding in the fat surrounding the left shoulder and axilla. There is no organized or drainable fluid collection. The surrounding bones are unremarkable. There is multilevel degenerative change of the cervical spine with grade 1 retrolisthesis of C4 on C5 and anterolisthesis of C5 on C6. There is a right internal jugular central venous catheter, incompletely imaged. There is calcified atherosclerotic plaque at the right carotid bulb. There is a small left pleural effusion. The lung apices are otherwise normal.      Inflammatory stranding surrounding the left shoulder and axilla without organized or drainable fluid collection and mild reactive lymphadenopathy.  Please note this exam is limited without contrast and MRI with dotarem would be more accurate in light of CKD. The bones of the left shoulder are grossly unremarkable, though note that CT is insensitive for detection of early osteomyelitis. Small left pleural effusion.

## 2021-02-22 DIAGNOSIS — R4189 Other symptoms and signs involving cognitive functions and awareness: Secondary | ICD-10-CM | POA: Diagnosis not present

## 2021-02-22 DIAGNOSIS — R7881 Bacteremia: Secondary | ICD-10-CM | POA: Diagnosis not present

## 2021-02-22 DIAGNOSIS — B954 Other streptococcus as the cause of diseases classified elsewhere: Secondary | ICD-10-CM | POA: Diagnosis not present

## 2021-02-22 DIAGNOSIS — C9112 Chronic lymphocytic leukemia of B-cell type in relapse: Secondary | ICD-10-CM | POA: Diagnosis not present

## 2021-02-22 LAB — BASIC METABOLIC PANEL
ANION GAP: 7 mmol/L (ref 5–14)
BLOOD UREA NITROGEN: 13 mg/dL (ref 9–23)
BUN / CREAT RATIO: 4
CALCIUM: 9.2 mg/dL (ref 8.7–10.4)
CHLORIDE: 100 mmol/L (ref 98–107)
CO2: 30 mmol/L (ref 20.0–31.0)
CREATININE: 3.23 mg/dL — ABNORMAL HIGH
EGFR CKD-EPI AA FEMALE: 16 mL/min/{1.73_m2} — ABNORMAL LOW (ref >=60)
EGFR CKD-EPI NON-AA FEMALE: 14 mL/min/{1.73_m2} — ABNORMAL LOW (ref >=60)
GLUCOSE RANDOM: 117 mg/dL (ref 70–179)
POTASSIUM: 3.7 mmol/L (ref 3.4–4.8)
SODIUM: 137 mmol/L (ref 135–145)

## 2021-02-22 LAB — CBC W/ AUTO DIFF
BASOPHILS ABSOLUTE COUNT: 0.1 10*9/L (ref 0.0–0.1)
BASOPHILS RELATIVE PERCENT: 1.3 %
EOSINOPHILS ABSOLUTE COUNT: 0.5 10*9/L (ref 0.0–0.5)
EOSINOPHILS RELATIVE PERCENT: 3.9 %
HEMATOCRIT: 29.2 % — ABNORMAL LOW (ref 34.0–44.0)
HEMOGLOBIN: 9.4 g/dL — ABNORMAL LOW (ref 11.3–14.9)
LYMPHOCYTES ABSOLUTE COUNT: 8.1 10*9/L — ABNORMAL HIGH (ref 1.1–3.6)
LYMPHOCYTES RELATIVE PERCENT: 69.2 %
MEAN CORPUSCULAR HEMOGLOBIN CONC: 32.2 g/dL (ref 32.0–36.0)
MEAN CORPUSCULAR HEMOGLOBIN: 27.2 pg (ref 25.9–32.4)
MEAN CORPUSCULAR VOLUME: 84.6 fL (ref 77.6–95.7)
MEAN PLATELET VOLUME: 10.5 fL (ref 6.8–10.7)
MONOCYTES ABSOLUTE COUNT: 0.4 10*9/L (ref 0.3–0.8)
MONOCYTES RELATIVE PERCENT: 3.8 %
NEUTROPHILS ABSOLUTE COUNT: 2.5 10*9/L (ref 1.8–7.8)
NEUTROPHILS RELATIVE PERCENT: 21.8 %
PLATELET COUNT: 70 10*9/L — ABNORMAL LOW (ref 150–450)
RED BLOOD CELL COUNT: 3.45 10*12/L — ABNORMAL LOW (ref 3.95–5.13)
RED CELL DISTRIBUTION WIDTH: 18.1 % — ABNORMAL HIGH (ref 12.2–15.2)
WBC ADJUSTED: 11.7 10*9/L — ABNORMAL HIGH (ref 3.6–11.2)

## 2021-02-22 LAB — MAGNESIUM: MAGNESIUM: 1.7 mg/dL (ref 1.6–2.6)

## 2021-02-22 LAB — PHOSPHORUS: PHOSPHORUS: 1.6 mg/dL — ABNORMAL LOW (ref 2.4–5.1)

## 2021-02-22 LAB — SLIDE REVIEW

## 2021-02-22 MED ADMIN — hydrALAZINE (APRESOLINE) tablet 15 mg: 15 mg | ORAL | @ 11:00:00 | Stop: 2021-02-22

## 2021-02-22 MED ADMIN — acetaminophen (TYLENOL) tablet 1,000 mg: 1000 mg | ORAL | @ 18:00:00

## 2021-02-22 MED ADMIN — hydrALAZINE (APRESOLINE) tablet 20 mg: 20 mg | ORAL | @ 18:00:00

## 2021-02-22 MED ADMIN — carvediloL (COREG) tablet 25 mg: 25 mg | ORAL | @ 02:00:00

## 2021-02-22 MED ADMIN — acetaminophen (TYLENOL) tablet 1,000 mg: 1000 mg | ORAL | @ 11:00:00

## 2021-02-22 MED ADMIN — heparin (porcine) 5,000 unit/mL injection 5,000 Units: 5000 [IU] | SUBCUTANEOUS | @ 12:00:00

## 2021-02-22 MED ADMIN — NIFEdipine (PROCARDIA XL) 24 hr tablet 90 mg: 90 mg | ORAL | @ 12:00:00

## 2021-02-22 MED ADMIN — melatonin tablet 3 mg: 3 mg | ORAL | @ 02:00:00

## 2021-02-22 MED ADMIN — losartan (COZAAR) tablet 100 mg: 100 mg | ORAL | @ 12:00:00

## 2021-02-22 MED ADMIN — carvediloL (COREG) tablet 25 mg: 25 mg | ORAL | @ 12:00:00

## 2021-02-22 MED ADMIN — cefTRIAXone (ROCEPHIN) 2 g in sodium chloride 0.9 % (NS) 100 mL IVPB-connector bag: 2 g | INTRAVENOUS | @ 16:00:00 | Stop: 2021-03-02

## 2021-02-22 MED ADMIN — hydrALAZINE (APRESOLINE) tablet 15 mg: 15 mg | ORAL | @ 02:00:00

## 2021-02-22 MED ADMIN — atorvastatin (LIPITOR) tablet 40 mg: 40 mg | ORAL | @ 12:00:00

## 2021-02-22 MED ADMIN — heparin (porcine) 5,000 unit/mL injection 5,000 Units: 5000 [IU] | SUBCUTANEOUS | @ 02:00:00

## 2021-02-22 MED ADMIN — lidocaine (LIDODERM) 5 % patch 2 patch: 2 | TRANSDERMAL | @ 02:00:00

## 2021-02-22 MED ADMIN — acetaminophen (TYLENOL) tablet 1,000 mg: 1000 mg | ORAL | @ 02:00:00

## 2021-02-22 MED ADMIN — sodium phosphate 21 mmol in dextrose 5 % 250 mL IVPB: 21 mmol | INTRAVENOUS | @ 16:00:00 | Stop: 2021-02-22

## 2021-02-22 MED ADMIN — furosemide (LASIX) tablet 80 mg: 80 mg | ORAL | @ 12:00:00

## 2021-02-22 MED ADMIN — sertraline (ZOLOFT) tablet 100 mg: 100 mg | ORAL | @ 02:00:00

## 2021-02-22 NOTE — Unmapped (Signed)
Hypertensive but vital signs otherwise consistent with patient baseline. Pain well controlled. Dressing changed x2 with stools. Had HD this AM w/o complication. Had bath with CHG and oral care plus linen change. Q2 turns. Decreased PO; encouraged small bites. Will CTM.     Problem: Adult Inpatient Plan of Care  Goal: Plan of Care Review  Outcome: Ongoing - Unchanged  Goal: Patient-Specific Goal (Individualized)  Outcome: Ongoing - Unchanged  Goal: Absence of Hospital-Acquired Illness or Injury  Outcome: Ongoing - Unchanged  Intervention: Identify and Manage Fall Risk  Recent Flowsheet Documentation  Taken 02/21/2021 0810 by Joya Salm, RN  Safety Interventions:   bed alarm   commode/urinal/bedpan at bedside   fall reduction program maintained   low bed   lighting adjusted for tasks/safety   nonskid shoes/slippers when out of bed  Intervention: Prevent and Manage VTE (Venous Thromboembolism) Risk  Recent Flowsheet Documentation  Taken 02/21/2021 0810 by Joya Salm, RN  Activity Management: activity adjusted per tolerance  Goal: Optimal Comfort and Wellbeing  Outcome: Ongoing - Unchanged  Goal: Readiness for Transition of Care  Outcome: Ongoing - Unchanged  Goal: Rounds/Family Conference  Outcome: Ongoing - Unchanged

## 2021-02-22 NOTE — Unmapped (Signed)
Hematology Resident (MEDE) Progress Note    Assessment & Plan:   Cristina Fields is a 70 y.o. female with CLL, ESRD on iHD, hypertension, prior cerebellar infarct that presented as a direct admission from infusion clinic with disorientation and pain. Found to have Strep Bovis bacteremia.     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 *    Strep Bovis Bacteremia (1/2 blood cx positive on 4/25): 1/2 Blood Cx positive for Strep Bovis sp. Given immunocompromised state, will treat as true bacteremia. TEE negative for endocarditis.   -CTX 2g daily (4/27-5/11, two week course)  -Outpatient referral to GI for colonoscopy at discharge.   -VIR consulted to remove HD tunneled line  -ICID consulted, appreciate assistance  -Colonoscopy performed at [facility] 12/2018 with one polyp removed and was otherwise normal.       Encephalopathy: Previously Grade 3. Worsening likely multifactorial in the setting of bacteremia per above, toxic-metabolic/uremia, and medication/pain driven superimposed on cognitive impairment at baseline (SLUMS 15 as outpatient).   -Treat infection  -Delirium precautions    Left AC OA - Neck Pain: Experiencing some neck pain that changes in location each day on exam. CT scan showed only non-specific inflammatory stranding in the fat surrounding the left shoulder and axilla.   -No further imaging indicated.   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 Seen by lower GI surgery this admission with no surgical needs at this time.  -Wound Care consulted, wet to dry dressings  -Will need outpatient pap smear to assess for cervical HPV/malignancy  -Pain control with scheduled tylenol, PRN oxycodone 2.5-5 mg q4h for severe pain.     CLL - Worsening Leukocytosis with lymphocyte predominance:  Oncology history:  Follows with Dr. Lonni Fix. Diagnosed ~2012 and received fludarabine + cyclophosphamide + rituximab x6. Relapsed in 05/2014 treated with bendamustine + rituximab. Relapsed again in 2018 and started ibrutinib in 5/201. Rapid relapse in 04/2020 and was treated with rituximab and chlorambucil through November, 2021. On zanubrutinib prior to admission.   - Resume zanubrutinib once brought in from home  - BMBx planned outpatient. Can consider performing this while she is hospitalized.     Anemia - Thrombocytopenia:  -Transfuse for hgb <7, platelets <10,000, higher if actively bleeding    ESRD on iHD - HTN: TThSat Dialysis. Continues to remain hypertensive despite up-titration of home meds while admitted.  -iHD while inpatient  -Coreg 25 mg BID; Nifedipine XL 90 mg daily; Hydralazine increase to 20 mg q8h; Losartan 100mg  daily  -Lasix 80 mg daily    Daily Checklist:  Diet: Regular Diet  DVT PPx: Heparin TID  Electrolytes: Replete Potassium to >/= 3.6 and Magnesium to >/= 1.8  Code Status: Full Code  Dispo: Admit to 4 ONC    Team Contact Information:   Primary Team: Hematology Resident (MEDE)  Primary Resident: Coralee Pesa, MD  Resident's Pager: [phone] (Hematology Intern - Clif[place]on Asters)    Interval History:   No acute complaints this morning. Able to state her name, the year, and knows she is in the hospital. Still with word finding difficulties when trying to say we  are in [place] (states [place], [place] but knows she is saying the incorrect thing).     ROS otherwise negative.     Objective:   Temp:  [35.6 ??C-37.2 ??C] 36.7 ??C  Heart Rate:  [70-86] 70  Resp:  [18] 18  BP: (142-203)/(51-84) 148/51  SpO2:  [96 %-99 %] 97 %    General: Lying in bed in NAD  Neck: Mild tenderness to palpation over the left clavicle area.   Cardiac: Regular rate and rhythm. No m/r/g.  Pulm: Normal work of breathing on RA.  MSK: No LE edema.  Skin: Warm and dry.   Neuro: Oriented to person, place, and year. Mental status improving from prior exams today.     Labs/Studies: Labs and Studies from the last 24hrs per EMR and Reviewed

## 2021-02-22 NOTE — Unmapped (Signed)
Pt AxOx3, disoriented to time. VSS, hypertensive. Pain well controlled. Bed alarm, q2turns, decreased oral intake. Pt resting quietly overnight.       Problem: Adult Inpatient Plan of Care  Goal: Plan of Care Review  Outcome: Ongoing - Unchanged  Goal: Patient-Specific Goal (Individualized)  Outcome: Ongoing - Unchanged  Goal: Absence of Hospital-Acquired Illness or Injury  Outcome: Ongoing - Unchanged  Intervention: Identify and Manage Fall Risk  Recent Flowsheet Documentation  Taken 02/22/2021 0448 by Azzie Almas, RN  Safety Interventions:  ??? bed alarm  ??? fall reduction program maintained  Taken 02/22/2021 0245 by Azzie Almas, RN  Safety Interventions:  ??? bed alarm  ??? fall reduction program maintained  ??? lighting adjusted for tasks/safety  ??? low bed  Taken 02/22/2021 0020 by Azzie Almas, RN  Safety Interventions:  ??? bed alarm  ??? fall reduction program maintained  ??? lighting adjusted for tasks/safety  ??? low bed  Taken 02/21/2021 2211 by Azzie Almas, RN  Safety Interventions:  ??? bleeding precautions  ??? fall reduction program maintained  ??? lighting adjusted for tasks/safety  ??? low bed  ??? nonskid shoes/slippers when out of bed  ??? no IV/BP/blood draw left arm  ??? room near unit station  Intervention: Prevent Skin Injury  Recent Flowsheet Documentation  Taken 02/21/2021 2211 by Azzie Almas, RN  Skin Protection: adhesive use limited  Intervention: Prevent and Manage VTE (Venous Thromboembolism) Risk  Recent Flowsheet Documentation  Taken 02/21/2021 2211 by Azzie Almas, RN  Activity Management: activity adjusted per tolerance  Goal: Optimal Comfort and Wellbeing  Outcome: Ongoing - Unchanged  Goal: Readiness for Transition of Care  Outcome: Ongoing - Unchanged  Goal: Rounds/Family Conference  Outcome: Ongoing - Unchanged     Problem: Self-Care Deficit  Goal: Improved Ability to Complete Activities of Daily Living  Outcome: Ongoing - Unchanged     Problem: Fall Injury Risk  Goal: Absence of Fall and Fall-Related Injury  Outcome: Ongoing - Unchanged  Intervention: Promote Injury-Free Environment  Recent Flowsheet Documentation  Taken 02/22/2021 0448 by Azzie Almas, RN  Safety Interventions:  ??? bed alarm  ??? fall reduction program maintained  Taken 02/22/2021 0245 by Azzie Almas, RN  Safety Interventions:  ??? bed alarm  ??? fall reduction program maintained  ??? lighting adjusted for tasks/safety  ??? low bed  Taken 02/22/2021 0020 by Azzie Almas, RN  Safety Interventions:  ??? bed alarm  ??? fall reduction program maintained  ??? lighting adjusted for tasks/safety  ??? low bed  Taken 02/21/2021 2211 by Azzie Almas, RN  Safety Interventions:  ??? bleeding precautions  ??? fall reduction program maintained  ??? lighting adjusted for tasks/safety  ??? low bed  ??? nonskid shoes/slippers when out of bed  ??? no IV/BP/blood draw left arm  ??? room near unit station     Problem: Impaired Wound Healing  Goal: Optimal Wound Healing  Outcome: Ongoing - Unchanged  Intervention: Promote Wound Healing  Recent Flowsheet Documentation  Taken 02/21/2021 2211 by Azzie Almas, RN  Activity Management: activity adjusted per tolerance     Problem: Device-Related Complication Risk (Hemodialysis)  Goal: Safe, Effective Therapy Delivery  Outcome: Ongoing - Unchanged     Problem: Hemodynamic Instability (Hemodialysis)  Goal: Effective Tissue Perfusion  Outcome: Ongoing - Unchanged     Problem: Infection (Hemodialysis)  Goal: Absence of Infection Signs and Symptoms  Outcome: Ongoing - Unchanged     Problem: Skin Injury  Risk Increased  Goal: Skin Health and Integrity  Outcome: Ongoing - Unchanged  Intervention: Optimize Skin Protection  Recent Flowsheet Documentation  Taken 02/21/2021 2211 by Azzie Almas, RN  Pressure Reduction Techniques:  ??? frequent weight shift encouraged  ??? positioned off wounds  Pressure Reduction Devices: pressure-redistributing mattress utilized  Skin Protection: adhesive use limited

## 2021-02-23 DIAGNOSIS — B954 Other streptococcus as the cause of diseases classified elsewhere: Secondary | ICD-10-CM | POA: Diagnosis not present

## 2021-02-23 DIAGNOSIS — C9112 Chronic lymphocytic leukemia of B-cell type in relapse: Secondary | ICD-10-CM | POA: Diagnosis not present

## 2021-02-23 DIAGNOSIS — R4189 Other symptoms and signs involving cognitive functions and awareness: Secondary | ICD-10-CM | POA: Diagnosis not present

## 2021-02-23 DIAGNOSIS — R7881 Bacteremia: Secondary | ICD-10-CM | POA: Diagnosis not present

## 2021-02-23 LAB — BASIC METABOLIC PANEL
ANION GAP: 10 mmol/L (ref 5–14)
BLOOD UREA NITROGEN: 14 mg/dL (ref 9–23)
BUN / CREAT RATIO: 3
CALCIUM: 9.3 mg/dL (ref 8.7–10.4)
CHLORIDE: 99 mmol/L (ref 98–107)
CO2: 26 mmol/L (ref 20.0–31.0)
CREATININE: 4.49 mg/dL — ABNORMAL HIGH
EGFR CKD-EPI AA FEMALE: 11 mL/min/{1.73_m2} — ABNORMAL LOW (ref >=60)
EGFR CKD-EPI NON-AA FEMALE: 9 mL/min/{1.73_m2} — ABNORMAL LOW (ref >=60)
GLUCOSE RANDOM: 108 mg/dL (ref 70–179)
POTASSIUM: 3.7 mmol/L (ref 3.4–4.8)
SODIUM: 135 mmol/L (ref 135–145)

## 2021-02-23 LAB — SLIDE REVIEW

## 2021-02-23 LAB — HEPATIC FUNCTION PANEL
ALBUMIN: 2.9 g/dL — ABNORMAL LOW (ref 3.4–5.0)
ALKALINE PHOSPHATASE: 138 U/L — ABNORMAL HIGH (ref 46–116)
ALT (SGPT): 22 U/L (ref 10–49)
AST (SGOT): 42 U/L — ABNORMAL HIGH (ref <=34)
BILIRUBIN DIRECT: 0.1 mg/dL (ref 0.00–0.30)
BILIRUBIN TOTAL: 0.2 mg/dL — ABNORMAL LOW (ref 0.3–1.2)
PROTEIN TOTAL: 4.9 g/dL — ABNORMAL LOW (ref 5.7–8.2)

## 2021-02-23 LAB — CBC W/ AUTO DIFF
BASOPHILS ABSOLUTE COUNT: 0.2 10*9/L — ABNORMAL HIGH (ref 0.0–0.1)
BASOPHILS RELATIVE PERCENT: 1.4 %
EOSINOPHILS ABSOLUTE COUNT: 0.6 10*9/L — ABNORMAL HIGH (ref 0.0–0.5)
EOSINOPHILS RELATIVE PERCENT: 3.6 %
HEMATOCRIT: 26.8 % — ABNORMAL LOW (ref 34.0–44.0)
HEMOGLOBIN: 8.7 g/dL — ABNORMAL LOW (ref 11.3–14.9)
LYMPHOCYTES ABSOLUTE COUNT: 11.3 10*9/L — ABNORMAL HIGH (ref 1.1–3.6)
LYMPHOCYTES RELATIVE PERCENT: 71.1 %
MEAN CORPUSCULAR HEMOGLOBIN CONC: 32.5 g/dL (ref 32.0–36.0)
MEAN CORPUSCULAR HEMOGLOBIN: 27.4 pg (ref 25.9–32.4)
MEAN CORPUSCULAR VOLUME: 84.1 fL (ref 77.6–95.7)
MEAN PLATELET VOLUME: 11.1 fL — ABNORMAL HIGH (ref 6.8–10.7)
MONOCYTES ABSOLUTE COUNT: 1 10*9/L — ABNORMAL HIGH (ref 0.3–0.8)
MONOCYTES RELATIVE PERCENT: 6.1 %
NEUTROPHILS ABSOLUTE COUNT: 2.8 10*9/L (ref 1.8–7.8)
NEUTROPHILS RELATIVE PERCENT: 17.8 %
PLATELET COUNT: 86 10*9/L — ABNORMAL LOW (ref 150–450)
RED BLOOD CELL COUNT: 3.18 10*12/L — ABNORMAL LOW (ref 3.95–5.13)
RED CELL DISTRIBUTION WIDTH: 18.4 % — ABNORMAL HIGH (ref 12.2–15.2)
WBC ADJUSTED: 15.8 10*9/L — ABNORMAL HIGH (ref 3.6–11.2)

## 2021-02-23 LAB — PERIPHERAL BLOOD SMEAR, PATH REVIEW

## 2021-02-23 LAB — MAGNESIUM: MAGNESIUM: 1.5 mg/dL — ABNORMAL LOW (ref 1.6–2.6)

## 2021-02-23 LAB — PHOSPHORUS: PHOSPHORUS: 4 mg/dL (ref 2.4–5.1)

## 2021-02-23 MED ADMIN — cefTRIAXone (ROCEPHIN) 2 g in sodium chloride 0.9 % (NS) 100 mL IVPB-connector bag: 2 g | INTRAVENOUS | @ 15:00:00 | Stop: 2021-03-02

## 2021-02-23 MED ADMIN — carvediloL (COREG) tablet 25 mg: 25 mg | ORAL | @ 12:00:00

## 2021-02-23 MED ADMIN — epoetin alfa-EPBX (RETACRIT) injection 2,000 Units: 2000 [IU] | INTRAVENOUS | @ 20:00:00

## 2021-02-23 MED ADMIN — acetaminophen (TYLENOL) tablet 1,000 mg: 1000 mg | ORAL | @ 03:00:00

## 2021-02-23 MED ADMIN — heparin (porcine) 5,000 unit/mL injection 5,000 Units: 5000 [IU] | SUBCUTANEOUS | @ 12:00:00

## 2021-02-23 MED ADMIN — losartan (COZAAR) tablet 100 mg: 100 mg | ORAL | @ 12:00:00

## 2021-02-23 MED ADMIN — lidocaine (LIDODERM) 5 % patch 2 patch: 2 | TRANSDERMAL | @ 03:00:00

## 2021-02-23 MED ADMIN — oxyCODONE (ROXICODONE) immediate release tablet 2.5 mg: 2.5 mg | ORAL | @ 08:00:00 | Stop: 2021-02-27

## 2021-02-23 MED ADMIN — heparin (porcine) 1000 unit/mL injection 3,000 Units: 3000 [IU] | INTRAVENOUS | @ 20:00:00

## 2021-02-23 MED ADMIN — acetaminophen (TYLENOL) tablet 1,000 mg: 1000 mg | ORAL | @ 10:00:00

## 2021-02-23 MED ADMIN — heparin (porcine) 5,000 unit/mL injection 5,000 Units: 5000 [IU] | SUBCUTANEOUS

## 2021-02-23 MED ADMIN — melatonin tablet 3 mg: 3 mg | ORAL

## 2021-02-23 MED ADMIN — atorvastatin (LIPITOR) tablet 40 mg: 40 mg | ORAL | @ 12:00:00

## 2021-02-23 MED ADMIN — magnesium sulfate 2gm/50mL IVPB: 2 g | INTRAVENOUS | @ 12:00:00 | Stop: 2021-02-23

## 2021-02-23 MED ADMIN — oxyCODONE (ROXICODONE) immediate release tablet 2.5 mg: 2.5 mg | ORAL | Stop: 2021-02-27

## 2021-02-23 MED ADMIN — hydrALAZINE (APRESOLINE) tablet 20 mg: 20 mg | ORAL | @ 10:00:00

## 2021-02-23 MED ADMIN — hydrALAZINE (APRESOLINE) tablet 20 mg: 20 mg | ORAL | @ 03:00:00

## 2021-02-23 MED ADMIN — oxyCODONE (ROXICODONE) immediate release tablet 2.5 mg: 2.5 mg | ORAL | @ 12:00:00 | Stop: 2021-02-27

## 2021-02-23 MED ADMIN — carvediloL (COREG) tablet 25 mg: 25 mg | ORAL

## 2021-02-23 MED ADMIN — sertraline (ZOLOFT) tablet 100 mg: 100 mg | ORAL

## 2021-02-23 MED ADMIN — furosemide (LASIX) tablet 80 mg: 80 mg | ORAL | @ 12:00:00

## 2021-02-23 MED ADMIN — paricalcitoL (ZEMPLAR) injection 5 mcg: 5 ug | INTRAVENOUS | @ 20:00:00

## 2021-02-23 MED ADMIN — diclofenac sodium (VOLTAREN) 1 % gel 2 g: 2 g | TOPICAL

## 2021-02-23 MED ADMIN — NIFEdipine (PROCARDIA XL) 24 hr tablet 90 mg: 90 mg | ORAL | @ 12:00:00

## 2021-02-23 NOTE — Unmapped (Signed)
3.5 hours dialysis. No fluid removal.  Monitor patient during treatment.    Problem: Device-Related Complication Risk (Hemodialysis)  Goal: Safe, Effective Therapy Delivery  Outcome: Ongoing - Unchanged     Problem: Hemodynamic Instability (Hemodialysis)  Goal: Effective Tissue Perfusion  Outcome: Ongoing - Unchanged     Problem: Infection (Hemodialysis)  Goal: Absence of Infection Signs and Symptoms  Outcome: Ongoing - Unchanged

## 2021-02-23 NOTE — Unmapped (Signed)
Hematology Resident (MEDE) Progress Note    Assessment & Plan:   Cristina Fields is a 70 y.o. female with CLL, ESRD on iHD, hypertension, prior cerebellar infarct that presented as a direct admission from infusion clinic with disorientation and pain. Found to have Strep Bovis bacteremia.     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 *    Streptococcus bovis bacteremia: BCx 2 of 2 positive. Given immunocompromised state, will treat as true bacteremia. TEE negative for endocarditis.   -CTX 2g daily (4/27-5/11, two week course)  -Outpatient referral to GI for colonoscopy at discharge  -VIR consulted to remove HD tunneled line  -ICID consulted, appreciate assistance  -Colonoscopy performed at [facility] 12/2018 with one polyp removed and was otherwise normal     Encephalopathy: Now improved. Worsening likely multifactorial in the setting of bacteremia per above, toxic-metabolic/uremia, and medication/pain driven superimposed on cognitive impairment at baseline (SLUMS 15 as outpatient).   -Treat infection  -Delirium precautions    Left AC OA - Neck Pain: Experiencing some neck pain that changes in location each day on exam. CT scan showed only non-specific inflammatory stranding in the fat surrounding the left shoulder and axilla.   -No further imaging indicated.   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 Seen by lower GI surgery this admission with no surgical needs at this time.  -Wound Care consulted, wet to dry dressings  -Will need outpatient pap smear to assess for cervical HPV/malignancy  -Pain control with scheduled tylenol, PRN oxycodone 2.5 mg q4h for severe pain.     CLL - Worsening Leukocytosis with lymphocyte predominance:  Oncology history:  Follows with Dr. Lonni Fix. Diagnosed ~2012 and received fludarabine + cyclophosphamide + rituximab x6. Relapsed in 05/2014 treated with bendamustine + rituximab. Relapsed again in 2018 and started ibrutinib in 5/201. Rapid relapse in 04/2020 and was treated with rituximab and chlorambucil through November, 2021. On zanubrutinib prior to admission.   - Resume zanubrutinib once brought in from home  - BMBx planned outpatient. Can consider performing this while she is hospitalized.     Anemia - Thrombocytopenia: Due to CLL and chemotherapy.  -Transfuse for hgb <7, platelets <10,000, higher if actively bleeding    ESRD on iHD - HTN: TThSat Dialysis. Continues to remain hypertensive despite up-titration of home meds while admitted.  -iHD while inpatient  -Coreg 25 mg BID; Nifedipine XL 90 mg daily; Hydralazine increase to 20 mg q8h; Losartan 100mg  daily  -Lasix 80 mg daily    Daily Checklist:  Diet: Regular Diet  DVT PPx: Heparin TID  Electrolytes: Replete Potassium to >/= 3.6 and Magnesium to >/= 1.8  Code Status: Full Code  Dispo: Admit to 4 ONC    Team Contact Information:   Primary Team: Hematology Resident (MEDE)  Primary Resident: Cristina Safe, MD  Resident's Pager: [phone] (Hematology Intern - [place])    Interval History:   No acute complaints this morning. Knows she's in the hospital, but not sure exact location. Recites correct year and name. No pain. Denies fevers or nausea.    ROS otherwise negative.  Objective:   Temp:  [36.5 ??C-37.1 ??C] 36.6 ??C  Heart Rate:  [72-73] 73  Resp:  [18] 18  BP: (132-182)/(50-75) 177/75  SpO2:  [93 %-100 %] 99 %    General: Lying in bed in NAD  Neck: Mild tenderness to palpation over the left clavicle area.   Pulm: Normal work of breathing on RA.  MSK: No LE edema.  Skin: Warm and dry.   Neuro: Oriented to person, place, and year. Mental status improving from prior exams today. Labs/Studies: Labs and Studies from the last 24hrs per EMR and Reviewed

## 2021-02-23 NOTE — Unmapped (Signed)
[facility] Nephrology Hemodialysis Procedure Note     02/23/2021    Cristina Fields was seen and examined on hemodialysis    CHIEF COMPLAINT: End Stage Renal Disease     INTERVAL HISTORY:   Using left upper arm AVF today. Tolerating procedure. Reports reduced PO intake and below her EDW      DIALYSIS TREATMENT DATA:  Estimated Dry Weight (kg): 49.5 kg (109 lb 2 oz)  Patient Goal Weight (kg): 0 kg (0 lb)  Dialyzer: F-180 (98 mLs)  Dialysis Bath  Bath: 3 K+ / 2.5 Ca+  Dialysate Na (mEq/L): 137 mEq/L  Dialysate HCO3 (mEq/L): 35 mEq/L  Dialysate Total Buffer HCO3 (mEq/L): 35 mEq/L  Blood Flow Rate (mL/min): 200 mL/min  Dialysis Flow (mL/min): 800 mL/min    PHYSICAL EXAM:  Vitals:  Temp:  [36.6 ??C-37.1 ??C] 36.9 ??C  Heart Rate:  [72-77] 77  BP: (132-182)/(50-89) 156/89  MAP (mmHg):  [103-110] 110  Weights:  Pre-Treatment Weight (kg): 48.6 kg (107 lb 2.3 oz)    General: Appearing in no acute distress  Pulmonary: clear to auscultation  Cardiovascular: regular rate and rhythm  Extremities: no significant  edema  Access: LUE AV graft   Dialyzing in chair    LAB DATA:  Lab Results   Component Value Date    NA 135 02/23/2021    K 3.7 02/23/2021    CL 99 02/23/2021    CO2 26.0 02/23/2021    BUN 14 02/23/2021    CREATININE 4.49 (H) 02/23/2021      Lab Results   Component Value Date    HCT 26.8 (L) 02/23/2021    WBC 15.8 (H) 02/23/2021        ASSESSMENT/PLAN:  End Stage Renal Disease on Intermittent Hemodialysis:  - UF goal: net even as tolerated  - Adjust medications for a GFR <10  - Avoid nephrotoxic agents     Bone Mineral Metabolism:  Lab Results   Component Value Date    CALCIUM 9.3 02/23/2021    CALCIUM 9.2 02/22/2021    Lab Results   Component Value Date    ALBUMIN 2.9 (L) 02/23/2021    ALBUMIN 2.7 (L) 02/20/2021      Lab Results   Component Value Date    PHOS 4.0 02/23/2021    PHOS 1.6 (L) 02/22/2021    No results found for: PTH   - Continue phosphorus binder and dietary counseling.    Anemia:   Lab Results   Component Value Date    HGB 8.7 (L) 02/23/2021    HGB 9.4 (L) 02/22/2021    HGB 9.1 (L) 02/21/2021    Iron Saturation (%)   Date Value Ref Range Status   02/13/2021 21 % Final      Lab Results   Component Value Date    FERRITIN 2,051.1 (H) 02/13/2021       Continue intravenous Epogen/Retacrit 2K units with each treatment.       Payton Emerald, MD  [facility] Division of Nephrology & Hypertension

## 2021-02-23 NOTE — Unmapped (Signed)
Pt is alert and oriented x4 with cues, c/o L neck/shoulder swelling side pain, BP  borderline high for the same, prn med given, BP and stable after med; perineal wound care given, has one large soft BM; turned q2h; afebrile; falls precaution in place; will continue to monitor.     Problem: Adult Inpatient Plan of Care  Goal: Plan of Care Review  Outcome: Progressing  Goal: Patient-Specific Goal (Individualized)  Outcome: Progressing  Goal: Absence of Hospital-Acquired Illness or Injury  Outcome: Progressing  Intervention: Identify and Manage Fall Risk  Recent Flowsheet Documentation  Taken 02/22/2021 2000 by Benay Pike, RN  Safety Interventions:   fall reduction program maintained   low bed  Intervention: Prevent Skin Injury  Recent Flowsheet Documentation  Taken 02/22/2021 2000 by Benay Pike, RN  Skin Protection: adhesive use limited  Intervention: Prevent and Manage VTE (Venous Thromboembolism) Risk  Recent Flowsheet Documentation  Taken 02/23/2021 0400 by Benay Pike, RN  Activity Management:   activity adjusted per tolerance   bedrest  Taken 02/23/2021 0200 by Benay Pike, RN  Activity Management:   activity adjusted per tolerance   bedrest  Taken 02/23/2021 0000 by Benay Pike, RN  Activity Management:   activity adjusted per tolerance   bedrest  Taken 02/22/2021 2200 by Benay Pike, RN  Activity Management:   activity adjusted per tolerance   bedrest  Taken 02/22/2021 2000 by Benay Pike, RN  Activity Management: activity adjusted per tolerance  VTE Prevention/Management: (heparin) anticoagulant therapy  Goal: Optimal Comfort and Wellbeing  Outcome: Progressing  Goal: Readiness for Transition of Care  Outcome: Progressing  Goal: Rounds/Family Conference  Outcome: Progressing     Problem: Self-Care Deficit  Goal: Improved Ability to Complete Activities of Daily Living  Outcome: Progressing     Problem: Fall Injury Risk  Goal: Absence of Fall and Fall-Related Injury  Outcome: Progressing  Intervention: Promote Injury-Free Environment  Recent Flowsheet Documentation  Taken 02/22/2021 2000 by Benay Pike, RN  Safety Interventions:   fall reduction program maintained   low bed     Problem: Impaired Wound Healing  Goal: Optimal Wound Healing  Outcome: Progressing  Intervention: Promote Wound Healing  Recent Flowsheet Documentation  Taken 02/23/2021 0400 by Benay Pike, RN  Activity Management:   activity adjusted per tolerance   bedrest  Taken 02/23/2021 0200 by Benay Pike, RN  Activity Management:   activity adjusted per tolerance   bedrest  Taken 02/23/2021 0000 by Benay Pike, RN  Activity Management:   activity adjusted per tolerance   bedrest  Taken 02/22/2021 2200 by Benay Pike, RN  Activity Management:   activity adjusted per tolerance   bedrest  Taken 02/22/2021 2000 by Benay Pike, RN  Activity Management: activity adjusted per tolerance     Problem: Device-Related Complication Risk (Hemodialysis)  Goal: Safe, Effective Therapy Delivery  Outcome: Progressing     Problem: Hemodynamic Instability (Hemodialysis)  Goal: Effective Tissue Perfusion  Outcome: Progressing     Problem: Infection (Hemodialysis)  Goal: Absence of Infection Signs and Symptoms  Outcome: Progressing

## 2021-02-23 NOTE — Unmapped (Signed)
Mildly hypertensive but vitals otherwise consistent with baseline. Afebrile. Scheduled tylenol for rectal discomfort. Dressing changed per order. Worked with PT/OT. Up to chair for a few hours. Ate whole strawberry supershake this morning. Husband at bedside. Will CTM.     Problem: Adult Inpatient Plan of Care  Goal: Plan of Care Review  Outcome: Ongoing - Unchanged  Goal: Patient-Specific Goal (Individualized)  Outcome: Ongoing - Unchanged  Goal: Absence of Hospital-Acquired Illness or Injury  Outcome: Ongoing - Unchanged  Intervention: Identify and Manage Fall Risk  Recent Flowsheet Documentation  Taken 02/22/2021 0715 by Joya Salm, RN  Safety Interventions:   bed alarm   fall reduction program maintained   lighting adjusted for tasks/safety   low bed   nonskid shoes/slippers when out of bed  Intervention: Prevent and Manage VTE (Venous Thromboembolism) Risk  Recent Flowsheet Documentation  Taken 02/22/2021 0715 by Joya Salm, RN  Activity Management: activity adjusted per tolerance  Goal: Optimal Comfort and Wellbeing  Outcome: Ongoing - Unchanged  Goal: Readiness for Transition of Care  Outcome: Ongoing - Unchanged  Goal: Rounds/Family Conference  Outcome: Ongoing - Unchanged

## 2021-02-24 DIAGNOSIS — D696 Thrombocytopenia, unspecified: Secondary | ICD-10-CM | POA: Diagnosis not present

## 2021-02-24 DIAGNOSIS — C9110 Chronic lymphocytic leukemia of B-cell type not having achieved remission: Secondary | ICD-10-CM | POA: Diagnosis not present

## 2021-02-24 DIAGNOSIS — C9112 Chronic lymphocytic leukemia of B-cell type in relapse: Secondary | ICD-10-CM | POA: Diagnosis not present

## 2021-02-24 DIAGNOSIS — A491 Streptococcal infection, unspecified site: Secondary | ICD-10-CM | POA: Diagnosis not present

## 2021-02-24 DIAGNOSIS — D649 Anemia, unspecified: Secondary | ICD-10-CM | POA: Diagnosis not present

## 2021-02-24 LAB — CBC W/ AUTO DIFF
BASOPHILS ABSOLUTE COUNT: 0.1 10*9/L (ref 0.0–0.1)
BASOPHILS RELATIVE PERCENT: 0.8 %
EOSINOPHILS ABSOLUTE COUNT: 0.6 10*9/L — ABNORMAL HIGH (ref 0.0–0.5)
EOSINOPHILS RELATIVE PERCENT: 3 %
HEMATOCRIT: 27.5 % — ABNORMAL LOW (ref 34.0–44.0)
HEMOGLOBIN: 8.9 g/dL — ABNORMAL LOW (ref 11.3–14.9)
LYMPHOCYTES ABSOLUTE COUNT: 12.8 10*9/L — ABNORMAL HIGH (ref 1.1–3.6)
LYMPHOCYTES RELATIVE PERCENT: 70.3 %
MEAN CORPUSCULAR HEMOGLOBIN CONC: 32.3 g/dL (ref 32.0–36.0)
MEAN CORPUSCULAR HEMOGLOBIN: 27.2 pg (ref 25.9–32.4)
MEAN CORPUSCULAR VOLUME: 84.2 fL (ref 77.6–95.7)
MEAN PLATELET VOLUME: 10.6 fL (ref 6.8–10.7)
MONOCYTES ABSOLUTE COUNT: 1.7 10*9/L — ABNORMAL HIGH (ref 0.3–0.8)
MONOCYTES RELATIVE PERCENT: 9.3 %
NEUTROPHILS ABSOLUTE COUNT: 3 10*9/L (ref 1.8–7.8)
NEUTROPHILS RELATIVE PERCENT: 16.6 %
PLATELET COUNT: 88 10*9/L — ABNORMAL LOW (ref 150–450)
RED BLOOD CELL COUNT: 3.27 10*12/L — ABNORMAL LOW (ref 3.95–5.13)
RED CELL DISTRIBUTION WIDTH: 18.8 % — ABNORMAL HIGH (ref 12.2–15.2)
WBC ADJUSTED: 18.3 10*9/L — ABNORMAL HIGH (ref 3.6–11.2)

## 2021-02-24 LAB — BASIC METABOLIC PANEL
ANION GAP: 9 mmol/L (ref 5–14)
BLOOD UREA NITROGEN: 6 mg/dL — ABNORMAL LOW (ref 9–23)
BUN / CREAT RATIO: 2
CALCIUM: 9 mg/dL (ref 8.7–10.4)
CHLORIDE: 99 mmol/L (ref 98–107)
CO2: 28 mmol/L (ref 20.0–31.0)
CREATININE: 2.62 mg/dL — ABNORMAL HIGH
EGFR CKD-EPI AA FEMALE: 21 mL/min/{1.73_m2} — ABNORMAL LOW (ref >=60)
EGFR CKD-EPI NON-AA FEMALE: 18 mL/min/{1.73_m2} — ABNORMAL LOW (ref >=60)
GLUCOSE RANDOM: 113 mg/dL (ref 70–179)
POTASSIUM: 3.7 mmol/L (ref 3.4–4.8)
SODIUM: 136 mmol/L (ref 135–145)

## 2021-02-24 LAB — SLIDE REVIEW

## 2021-02-24 LAB — RNA EXTRACT AND HOLD BONE MARROW

## 2021-02-24 LAB — HEPATITIS B SURFACE ANTIBODY
HEPATITIS B SURFACE ANTIBODY QUANT: 69.61 m[IU]/mL — ABNORMAL HIGH (ref <8.00)
HEPATITIS B SURFACE ANTIBODY: REACTIVE — AB

## 2021-02-24 LAB — PHOSPHORUS: PHOSPHORUS: 2 mg/dL — ABNORMAL LOW (ref 2.4–5.1)

## 2021-02-24 LAB — HEPATITIS B SURFACE ANTIGEN: HEPATITIS B SURFACE ANTIGEN: NONREACTIVE

## 2021-02-24 LAB — MAGNESIUM: MAGNESIUM: 1.8 mg/dL (ref 1.6–2.6)

## 2021-02-24 MED ADMIN — hydrALAZINE (APRESOLINE) tablet 20 mg: 20 mg | ORAL | @ 19:00:00

## 2021-02-24 MED ADMIN — melatonin tablet 3 mg: 3 mg | ORAL | @ 02:00:00

## 2021-02-24 MED ADMIN — acetaminophen (TYLENOL) tablet 1,000 mg: 1000 mg | ORAL | @ 10:00:00

## 2021-02-24 MED ADMIN — acetaminophen (TYLENOL) tablet 1,000 mg: 1000 mg | ORAL | @ 19:00:00

## 2021-02-24 MED ADMIN — oxyCODONE (ROXICODONE) immediate release tablet 2.5 mg: 2.5 mg | ORAL | @ 16:00:00 | Stop: 2021-02-27

## 2021-02-24 MED ADMIN — hydrALAZINE (APRESOLINE) tablet 20 mg: 20 mg | ORAL | @ 02:00:00

## 2021-02-24 MED ADMIN — carvediloL (COREG) tablet 25 mg: 25 mg | ORAL | @ 02:00:00

## 2021-02-24 MED ADMIN — lidocaine (LIDODERM) 5 % patch 2 patch: 2 | TRANSDERMAL | @ 03:00:00

## 2021-02-24 MED ADMIN — oxyCODONE (ROXICODONE) immediate release tablet 2.5 mg: 2.5 mg | ORAL | Stop: 2021-02-27

## 2021-02-24 MED ADMIN — atorvastatin (LIPITOR) tablet 40 mg: 40 mg | ORAL | @ 12:00:00

## 2021-02-24 MED ADMIN — diclofenac sodium (VOLTAREN) 1 % gel 2 g: 2 g | TOPICAL | @ 10:00:00

## 2021-02-24 MED ADMIN — cefTRIAXone (ROCEPHIN) 2 g in sodium chloride 0.9 % (NS) 100 mL IVPB-connector bag: 2 g | INTRAVENOUS | @ 16:00:00 | Stop: 2021-03-02

## 2021-02-24 MED ADMIN — diclofenac sodium (VOLTAREN) 1 % gel 2 g: 2 g | TOPICAL | @ 16:00:00

## 2021-02-24 MED ADMIN — diclofenac sodium (VOLTAREN) 1 % gel 2 g: 2 g | TOPICAL | @ 02:00:00

## 2021-02-24 MED ADMIN — NIFEdipine (PROCARDIA XL) 24 hr tablet 90 mg: 90 mg | ORAL | @ 13:00:00

## 2021-02-24 MED ADMIN — carvediloL (COREG) tablet 25 mg: 25 mg | ORAL | @ 12:00:00

## 2021-02-24 MED ADMIN — acetaminophen (TYLENOL) tablet 1,000 mg: 1000 mg | ORAL | @ 02:00:00

## 2021-02-24 MED ADMIN — heparin (porcine) 5,000 unit/mL injection 5,000 Units: 5000 [IU] | SUBCUTANEOUS | @ 02:00:00

## 2021-02-24 MED ADMIN — heparin (porcine) 5,000 unit/mL injection 5,000 Units: 5000 [IU] | SUBCUTANEOUS | @ 12:00:00

## 2021-02-24 MED ADMIN — sertraline (ZOLOFT) tablet 100 mg: 100 mg | ORAL | @ 02:00:00

## 2021-02-24 MED ADMIN — oxyCODONE (ROXICODONE) immediate release tablet 2.5 mg: 2.5 mg | ORAL | @ 20:00:00 | Stop: 2021-02-27

## 2021-02-24 MED ADMIN — furosemide (LASIX) tablet 80 mg: 80 mg | ORAL | @ 12:00:00

## 2021-02-24 MED ADMIN — losartan (COZAAR) tablet 100 mg: 100 mg | ORAL | @ 12:00:00

## 2021-02-24 MED ADMIN — oxyCODONE (ROXICODONE) immediate release tablet 2.5 mg: 2.5 mg | ORAL | @ 12:00:00 | Stop: 2021-02-27

## 2021-02-24 MED ADMIN — diclofenac sodium (VOLTAREN) 1 % gel 2 g: 2 g | TOPICAL | @ 20:00:00

## 2021-02-24 MED ADMIN — hydrALAZINE (APRESOLINE) tablet 20 mg: 20 mg | ORAL | @ 10:00:00

## 2021-02-24 NOTE — Unmapped (Signed)
Pt is alert and oriented x4  with cues, delayed responses, c/o back,neck, shoulder pain, prn and schedule pain given; has large BM, wound care given; VS after pain med; anuric; husband called to know about possible discharge today, provider informed; falls precaution in place; will continue to monitor.    Problem: Adult Inpatient Plan of Care  Goal: Plan of Care Review  Outcome: Progressing  Goal: Patient-Specific Goal (Individualized)  Outcome: Progressing  Goal: Absence of Hospital-Acquired Illness or Injury  Outcome: Progressing  Intervention: Identify and Manage Fall Risk  Recent Flowsheet Documentation  Taken 02/23/2021 2200 by Benay Pike, RN  Safety Interventions:   bed alarm   fall reduction program maintained   family at bedside   low bed  Intervention: Prevent Skin Injury  Recent Flowsheet Documentation  Taken 02/23/2021 2200 by Benay Pike, RN  Skin Protection: adhesive use limited  Intervention: Prevent and Manage VTE (Venous Thromboembolism) Risk  Recent Flowsheet Documentation  Taken 02/24/2021 0400 by Benay Pike, RN  Activity Management:   activity adjusted per tolerance   bedrest  Taken 02/24/2021 0000 by Benay Pike, RN  Activity Management:   activity adjusted per tolerance   bedrest  Taken 02/23/2021 2100 by Benay Pike, RN  VTE Prevention/Management: anticoagulant therapy  Taken 02/23/2021 2000 by Benay Pike, RN  Activity Management:   activity adjusted per tolerance   bedrest  Goal: Optimal Comfort and Wellbeing  Outcome: Progressing  Goal: Readiness for Transition of Care  Outcome: Progressing  Goal: Rounds/Family Conference  Outcome: Progressing     Problem: Self-Care Deficit  Goal: Improved Ability to Complete Activities of Daily Living  Outcome: Progressing     Problem: Fall Injury Risk  Goal: Absence of Fall and Fall-Related Injury  Outcome: Progressing  Intervention: Promote Injury-Free Environment  Recent Flowsheet Documentation  Taken 02/23/2021 2200 by Benay Pike, RN  Safety Interventions:   bed alarm   fall reduction program maintained   family at bedside   low bed     Problem: Impaired Wound Healing  Goal: Optimal Wound Healing  Outcome: Progressing  Intervention: Promote Wound Healing  Recent Flowsheet Documentation  Taken 02/24/2021 0400 by Benay Pike, RN  Activity Management:   activity adjusted per tolerance   bedrest  Taken 02/24/2021 0000 by Benay Pike, RN  Activity Management:   activity adjusted per tolerance   bedrest  Taken 02/23/2021 2000 by Benay Pike, RN  Activity Management:   activity adjusted per tolerance   bedrest     Problem: Device-Related Complication Risk (Hemodialysis)  Goal: Safe, Effective Therapy Delivery  Outcome: Progressing     Problem: Hemodynamic Instability (Hemodialysis)  Goal: Effective Tissue Perfusion  Outcome: Progressing     Problem: Infection (Hemodialysis)  Goal: Absence of Infection Signs and Symptoms  Outcome: Progressing     Problem: Skin Injury Risk Increased  Goal: Skin Health and Integrity  Outcome: Progressing  Intervention: Optimize Skin Protection  Recent Flowsheet Documentation  Taken 02/24/2021 0400 by Benay Pike, RN  Pressure Reduction Techniques: weight shift assistance provided  Taken 02/24/2021 0200 by Benay Pike, RN  Pressure Reduction Techniques: weight shift assistance provided  Taken 02/24/2021 0000 by Benay Pike, RN  Pressure Reduction Techniques: weight shift assistance provided  Head of Bed [facility]) Positioning: HOB at 20-30 degrees  Taken 02/23/2021 2200 by Benay Pike, RN  Pressure Reduction Techniques: weight shift assistance provided  Pressure Reduction Devices: pressure-redistributing mattress utilized  Skin Protection: adhesive use limited  Taken 02/23/2021 2000 by Benay Pike, RN  Head of Bed [facility]) Positioning: HOB elevated

## 2021-02-24 NOTE — Unmapped (Signed)
Hematology Resident (MEDE) Progress Note    Assessment & Plan:   Cristina Fields is a 70 y.o. female with CLL, ESRD on iHD, hypertension, prior cerebellar infarct that presented as a direct admission from infusion clinic with disorientation and pain. Found to have Strep Bovis bacteremia.     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 *    Streptococcus bovis bacteremia: BCx 2 of 2 positive. Source possibly seeding from condyloma biopsy. TEE negative for endocarditis.   -CTX 2g daily (4/27-5/11, two week course)  -Outpatient referral to GI for colonoscopy at discharge  -VIR consulted to remove HD tunneled line  -ICID consulted, appreciate assistance  -Colonoscopy performed at [facility] 12/2018 with one polyp removed and was otherwise normal    CLL - Worsening Leukocytosis with lymphocyte predominance:  Oncology history:  Follows with Dr. Lonni Fix. Diagnosed ~2012 and received fludarabine + cyclophosphamide + rituximab x6. Relapsed in 05/2014 treated with bendamustine + rituximab. Relapsed again in 2018 and started ibrutinib in 5/201. Rapid relapse in 04/2020 and was treated with rituximab and chlorambucil through November, 2021. On zanubrutinib prior to admission.   - Resume zanubrutinib once brought in from home  - BMBx during hospitalization    Anemia - Thrombocytopenia: Due to CLL and chemotherapy.  -Transfuse for hgb <7, platelets <10,000, higher if actively bleeding    Encephalopathy: Now improved and near baseline. Worsening likely multifactorial in the setting of bacteremia per above, toxic-metabolic/uremia, and medication/pain driven superimposed on cognitive impairment at baseline (SLUMS 15 as outpatient).   -Treat infection  -Delirium precautions    Left AC OA - Neck Pain: CT scan showed only non-specific inflammatory stranding in the fat surrounding the left shoulder and axilla.   -No further imaging indicated.   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 Seen by lower GI surgery this admission with no surgical needs at this time.  -WOCN  -Outpatient pap smear to assess for cervical HPV/malignancy  -Pain control    ESRD on iHD - HTN: TThSat Dialysis. Continues to remain hypertensive despite up-titration of home meds while admitted.  -iHD while inpatient  -Coreg 25 mg BID; Nifedipine XL 90 mg daily; Hydralazine increase to 20 mg q8h; Losartan 100mg  daily  -Lasix 80 mg daily    Daily Checklist:  Diet: Regular Diet  DVT PPx: Heparin TID  Electrolytes: Replete Potassium to >/= 3.6 and Magnesium to >/= 1.8  Code Status: Full Code  Dispo: Admit to 4 ONC    Team Contact Information:   Primary Team: Hematology Resident (MEDE)  Primary Resident: Lyndel Safe, MD  Resident's Pager: [phone] (Hematology Intern - [place])    Interval History:   No complaints this morning. Just awakening on rounds. AOx4 with prompting. No pain. Denies fevers or nausea. Tolerated HD in chair.    ROS otherwise negative.     Objective:   Temp:  [36.5 ??C-37.1 ??C] 37 ??C  Heart Rate:  [70-87] 87  Resp:  [16-19] 18  BP: (138-189)/(47-89) 165/65  SpO2:  [93 %-97 %] 97 %    General: Lying in bed in NAD  Neck: Mild  tenderness to palpation over the left clavicle area.   Pulm: Normal work of breathing on RA.  Neuro: Oriented to person, place, and year. Mental status improving from prior exams today.     Labs/Studies: Labs and Studies from the last 24hrs per EMR and Reviewed

## 2021-02-24 NOTE — Unmapped (Signed)
Pt alert and orientedx4, VSS, afebrile during shift. PRN oxy given one time. Falls precautions maintained and no falls during shift.  Q2 turns maintained. Sacral dressing changed post large BM. Falls precautions maintained. Hemodialysis completed in chair.     Problem: Adult Inpatient Plan of Care  Goal: Plan of Care Review  Outcome: Progressing  Goal: Patient-Specific Goal (Individualized)  Outcome: Progressing  Goal: Absence of Hospital-Acquired Illness or Injury  Outcome: Progressing  Intervention: Identify and Manage Fall Risk  Recent Flowsheet Documentation  Taken 02/23/2021 1230 by Vaughan Browner, RN  Safety Interventions: bed alarm  Taken 02/23/2021 1037 by Vaughan Browner, RN  Safety Interventions: bed alarm  Taken 02/23/2021 0816 by Vaughan Browner, RN  Safety Interventions:   bed alarm   environmental modification   fall reduction program maintained   lighting adjusted for tasks/safety   low bed  Intervention: Prevent Skin Injury  Recent Flowsheet Documentation  Taken 02/23/2021 0817 by Vaughan Browner, RN  Skin Protection: adhesive use limited  Intervention: Prevent and Manage VTE (Venous Thromboembolism) Risk  Recent Flowsheet Documentation  Taken 02/23/2021 0817 by Vaughan Browner, RN  VTE Prevention/Management: anticoagulant therapy  Taken 02/23/2021 0816 by Vaughan Browner, RN  Activity Management:   activity adjusted per tolerance   bedrest  Goal: Optimal Comfort and Wellbeing  Outcome: Progressing  Goal: Readiness for Transition of Care  Outcome: Progressing  Goal: Rounds/Family Conference  Outcome: Progressing

## 2021-02-24 NOTE — Unmapped (Signed)
HEMODIALYSIS NURSE PROCEDURE NOTE    Treatment Number:  5 Room/Station:  3 Procedure Date:  02-24-21   Total Treatment Time:  211 Min.    CONSENT:  Written consent was obtained prior to the procedure and is detailed in the medical record. Prior to the start of the procedure, a time out was taken and the identity of the patient was confirmed via name, medical record number and date of birth.     WEIGHTS:  Hemodialysis Pre-Treatment Weights     Date/Time Pre-Treatment Weight (kg) Estimated Dry Weight (kg) Patient Goal Weight (kg) Total Goal Weight (kg)    02-24-21 1431 48.6 kg (107 lb 2.3 oz) 48.5 kg (106 lb 14.8 oz) 0 kg (0 lb) 0.55 kg (1 lb 3.4 oz)           Hemodialysis Post Treatment Weights     Date/Time Post-Treatment Weight (kg) Treatment Weight Change (kg)    2021/02/24 1816 48.3 kg (106 lb 7.7 oz) -0.3 kg        Active Dialysis Orders (168h ago, onward)     Start     Ordered    02-24-21 1500  Hemodialysis inpatient  Every Tue,Thu,Sat      Comments: [facility] IS NOT A DIALYSIS ACCESS   Question Answer Comment   K+ 3 meq/L    Ca++ 2.5 meq/L    Bicarb 35 meq/L    Na+ 137 meq/L    Na+ Modeling no    Dialyzer F180NR    Dialysate Temperature (C) 37    BFR-As tolerated to a maximum of: 500 mL/min    DFR 800 mL/min    Duration of treatment 3.5 Hr    Dry weight (kg) 48.5 kg    Challenge dry weight (kg) no    Fluid removal (L) to edw    Tubing Adult = 142 ml    Access Site AVG    Access Site Location Left    Keep SBP >: 95        2021-02-24 1459    02/16/21 0700  Hemodialysis inpatient  Every Tue,Thu,Sat,   Status:  Canceled      Comments: [facility]IJ IS NOT A DIALYSIS ACCESS   Question Answer Comment   K+ 3 meq/L    Ca++ 2.5 meq/L    Bicarb 35 meq/L    Na+ 137 meq/L    Na+ Modeling no    Dialyzer [id number]    Dialysate Temperature (C) 37    BFR-As tolerated to a maximum of: 500 mL/min    DFR 800 mL/min    Duration of treatment 3.5 Hr    Dry weight (kg) 49.5 kg    Challenge dry weight (kg) no    Fluid removal (L) to edw    Tubing Adult = 142 ml    Access Site AVG    Access Site Location Left    Keep SBP >: 95        02/14/21 1040              ACCESS SITE:       Hemodialysis Catheter  Right Subclavian (Active)   Site Assessment Clean;Dry;Intact 2021-02-24 0817   Proximal Lumen Status / Patency Capped Feb 24, 2021 0817   Proximal Lumen Intervention Capped - dead end 02/24/21 0817   Medial Lumen Status / Patency Capped 02/24/21 0817   Medial Lumen Intervention Capped - dead end 2021/02/24 0817   Dressing Intervention No intervention needed Feb 24, 2021 0817   Dressing Status  Clean;Dry;Intact/not removed 02/23/21 0817   Site Condition No complications 02/20/21 2048   Dressing Type CHG gel;Occlusive;Transparent 02/23/21 0817   Dressing Change Due 02/28/21 02/23/21 0817   Line Necessity Reviewed? Y 02/23/21 0817   Line Necessity Indications Yes - Hemodialysis 02/23/21 0817   Line Necessity Reviewed With MDE 02/23/21 0817        Arteriovenous Fistula - Vein Graft  Access Arteriovenous vein graft Left;Upper Arm (Active)   Site Assessment Clean;Dry;Intact 02/23/21 1816   AV Fistula Thrill Present;Bruit Present 02/23/21 1816   Status Deaccessed 02/23/21 1816   Dressing Intervention New dressing 02/23/21 1816   Dressing Status      Clean;Dry;Intact/not removed 02/23/21 1816   Site Condition No complications 02/23/21 1816   Dressing Gauze 02/23/21 1816   Dressing To Be Removed (Date/Time) @2200  02/23/21 1816       re.     Patient Lines/Drains/Airways Status     Active Peripheral & Central Intravenous Access     Name Placement date Placement time Site Days    Peripheral IV 02/20/21 Anterior;Right Forearm 02/20/21  0809  Forearm  3              LAB RESULTS:  Lab Results   Component Value Date    NA 135 02/23/2021    K 3.7 02/23/2021    CL 99 02/23/2021    CO2 26.0 02/23/2021    BUN 14 02/23/2021    CREATININE 4.49 (H) 02/23/2021    GLU 108 02/23/2021    CALCIUM 9.3 02/23/2021    PHOS 4.0 02/23/2021    MG 1.5 (L) 02/23/2021    IRON 35 (L) 02/13/2021    LABIRON 21 02/13/2021    FERRITIN 2,051.1 (H) 02/13/2021    TIBC 163 (L) 02/13/2021     Lab Results   Component Value Date    WBC 15.8 (H) 02/23/2021    HGB 8.7 (L) 02/23/2021    HCT 26.8 (L) 02/23/2021    PLT 86 (L) 02/23/2021    APTT 33.0 02/15/2021        VITAL SIGNS:  Temperature     Date/Time Temp Temp src       02/23/21 1816 36.5 ??C (97.7 ??F) ???         Hemodynamics     Date/Time Pulse BP MAP (mmHg) Patient Position    02/23/21 1816 77 174/72 ??? Lying    02/23/21 1800 77 155/71 ??? Lying    02/23/21 1730 75 172/70 ??? Lying    02/23/21 1700 78 161/72 ??? Lying    02/23/21 1645 78 160/69 ??? Lying    02/23/21 1630 ??? 161/68 ??? ???    02/23/21 1615 78 150/72 ??? Lying    02/23/21 1600 ??? 165/71 ??? ???    02/23/21 1545 73 163/71 ??? Lying        Blood Volume Monitor     Date/Time Blood Volume Change (%) HCT HGB Critline O2 SAT %    02/23/21 1816 -10.6 % 29.9 10.2 97.3    02/23/21 1800 -10.2 % 29.8 10.1 97.2    02/23/21 1730 -8.9 % 29.4 10 95.5    02/23/21 1700 -9.2 % 29.5 10 96.9    02/23/21 1645 -9.3 % 29.5 10 97    02/23/21 1630 -8.4 % 29.2 9.9 96.5    02/23/21 1615 -8.6 % 29.3 10 96.2    02/23/21 1600 -8.5 % 29.2 9.9 96.3    02/23/21 1545 -8.7 % 29.3 10 95.9  Oxygen Therapy     Date/Time Resp SpO2 O2 Device FiO2 (%) O2 Flow Rate (L/min)    02/23/21 1816 16 ??? ??? -- --    02/23/21 1800 16 ??? ??? -- --    02/23/21 1730 16 ??? ??? -- --    02/23/21 1700 16 ??? None (Room air) -- --    02/23/21 1645 16 ??? None (Room air) -- --    02/23/21 1615 16 ??? None (Room air) -- --    02/23/21 1545 16 ??? None (Room air) -- --        Oxygen Connected to Wall:  n/a    Pre-Hemodialysis Assessment     Date/Time Therapy Number Dialyzer All Machine Alarms Passed Air Detector Dialysis Flow (mL/min)    02/23/21 1431 5 F-180 (98 mLs) Yes Engaged 800 mL/min    Date/Time Verify Priming Solution Priming Volume Hemodialysis Independent pH Hemodialysis Machine Conductivity (mS/cm) Hemodialysis Independent Conductivity (mS/cm)    02/23/21 1431 0.9% NS 300 mL ??? 13.7 mS/cm 13.6 mS/cm    Date/Time Bicarb Conductivity Residual Bleach Negative Free Chlorine Total Chlorine Chloramine    02/23/21 1431 -- Yes -- 0 --        Pre-Hemodialysis Treatment Comments     Date/Time Pre-Hemodialysis Comments    02/23/21 1431 stabl,came in a w/c        Hemodialysis Treatment     Date/Time Blood Flow Rate (mL/min) Arterial Pressure (mmHg) Venous Pressure (mmHg) Transmembrane Pressure (mmHg)    02/23/21 1816 400 mL/min -163 mmHg 197 mmHg 60 mmHg    02/23/21 1800 400 mL/min -163 mmHg 199 mmHg 59 mmHg    02/23/21 1730 400 mL/min -166 mmHg 214 mmHg 60 mmHg    02/23/21 1700 400 mL/min -160 mmHg 224 mmHg 55 mmHg    02/23/21 1645 400 mL/min -160 mmHg 220 mmHg 55 mmHg    02/23/21 1630 400 mL/min -158 mmHg 222 mmHg 61 mmHg    02/23/21 1615 400 mL/min -161 mmHg 221 mmHg 55 mmHg    02/23/21 1600 400 mL/min -160 mmHg 224 mmHg 52 mmHg    02/23/21 1545 400 mL/min -161 mmHg 229 mmHg 52 mmHg    02/23/21 1515 400 mL/min -157 mmHg 216 mmHg 52 mmHg    02/23/21 1445 400 mL/min -147 mmHg 195 mmHg 62 mmHg    Date/Time Ultrafiltration Rate (mL/hr) Ultrafiltrate Removed (mL) Dialysate Flow Rate (mL/min) KECN (Kecn)    02/23/21 1816 250 mL/hr 648 mL 800 ml/min ???    02/23/21 1800 250 mL/hr 584 mL 800 ml/min ???    02/23/21 1730 250 mL/hr 457 mL 800 ml/min ???    02/23/21 1700 210 mL/hr 394 mL 800 ml/min ???    02/23/21 1645 210 mL/hr 375 mL 800 ml/min ???    02/23/21 1630 160 mL/hr 272 mL 800 ml/min ???    02/23/21 1615 160 mL/hr 234 mL 800 ml/min ???    02/23/21 1600 160 mL/hr 194 mL 800 ml/min ???    02/23/21 1545 160 mL/hr 155 mL 800 ml/min ???    02/23/21 1515 160 mL/hr 74 mL 800 ml/min ???    02/23/21 1445 160 mL/hr 11 mL 800 ml/min ???        Hemodialysis Treatment Comments     Date/Time Intra-Hemodialysis Comments    02/23/21 1816 rinse back    02/23/21 1800 pt. watching tv    02/23/21 1730 pt. watching tv  taped    02/23/21 1700 watching tv    02/23/21 1645 pt.  watching tv, resumed care    02/23/21 1615 pt stable.watching tv    02/23/21 1545 pt stable.resting    02/23/21 1515 pt stable    02/23/21 1445 HD started        Post Treatment     Date/Time Rinseback Volume (mL) On Line Clearance: spKt/V Total Liters Processed (L/min) Dialyzer Clearance    02/23/21 1816 300 mL 2.09 spKt/V 76.6 L/min Moderately streaked          Post Hemodialysis Treatment Comments     Date/Time Post-Hemodialysis Comments    02/23/21 1816 +        POST TREATMENT ASSESSMENT:  General appearance:  alert  Neurological:  Alert and oriented X 3, normal strength and tone. Normal symmetric reflexes. Normal coordination and gait  Lungs:  diminished breath sounds anterior - bilateral  Hearts:  regular rate and rhythm, S1, S2 normal, no murmur, click, rub or gallop  Abdomen:  soft, non-tender; bowel sounds normal; no masses,  no organomegaly  Pulses:  2+ and symmetric.  Skin:  Skin color, texture, turgor normal. No rashes or lesions    Hemodialysis I/O     Date/Time Total Hemodialysis Replacement Volume (mL) Total Ultrafiltrate Output (mL)    02/23/21 1816 ???  na 100 mL        [id number] - Medicaitons Given During Treatment  (last 4 hrs)         CELINA E WEBER, RN       Medication Name Action Time Action Route Rate Dose User     diclofenac sodium (VOLTAREN) 1 % gel 2 g 02/23/21 1603 Not Given Topical  2 g Vaughan Browner, RN          DEANNA Warrick Parisian, RN       Medication Name Action Time Action Route Rate Dose User     epoetin alfa-EPBX (RETACRIT) injection 2,000 Units 02/23/21 1548 Given Intravenous  2,000 Units Deanna Warrick Parisian, RN     heparin (porcine) 1000 unit/mL injection 3,000 Units 02/23/21 1547 Given Intravenous  3,000 Units Deanna Warrick Parisian, RN     paricalcitoL [facility]) injection 5 mcg 02/23/21 1549 Given Intravenous  5 mcg Deanna Warrick Parisian, RN                  Patient tolerated treatment in a  Dialysis Recliner.

## 2021-02-24 NOTE — Unmapped (Addendum)
Pt alert and orientedx4, VSS, afebrile during shift. PRN oxy given three time for pain. Husband at bedside. Meals encouraged. Falls precautions maintained and no falls during shift.  Q2 turns maintained. Bone marrow biopsy completed at bedside. Fentanyl ordered but not given because not stocked at the time and then oxycodone provided relief. Falls precautions maintained. No acute events.    Problem: Adult Inpatient Plan of Care  Goal: Plan of Care Review  Outcome: Progressing  Goal: Patient-Specific Goal (Individualized)  Outcome: Progressing  Goal: Absence of Hospital-Acquired Illness or Injury  Outcome: Progressing  Intervention: Identify and Manage Fall Risk  Recent Flowsheet Documentation  Taken 02/24/2021 0847 by Vaughan Browner, RN  Safety Interventions:  ??? bed alarm  ??? environmental modification  ??? fall reduction program maintained  ??? lighting adjusted for tasks/safety  ??? low bed  Intervention: Prevent Skin Injury  Recent Flowsheet Documentation  Taken 02/24/2021 0847 by Vaughan Browner, RN  Skin Protection: adhesive use limited  Intervention: Prevent and Manage VTE (Venous Thromboembolism) Risk  Recent Flowsheet Documentation  Taken 02/24/2021 0847 by Vaughan Browner, RN  Activity Management: activity adjusted per tolerance  Taken 02/24/2021 0808 by Vaughan Browner, RN  VTE Prevention/Management: anticoagulant therapy  Goal: Optimal Comfort and Wellbeing  Outcome: Progressing  Goal: Readiness for Transition of Care  Outcome: Progressing  Goal: Rounds/Family Conference  Outcome: Progressing

## 2021-02-24 NOTE — Unmapped (Signed)
Bone Marrow Biopsy Procedure Note    Indications: History of CLL, cytopenias     Procedure   TimeOut    Performed immediately prior to the procedure    Name :Bone Marrow Biopsy + Aspirate      Pre-medications/Sedation: None    Description:   From the prone position, the left posterior iliac crest was identified. The area was prepped and draped in the usual sterile fashion. 8ml of 2% lidocaine was used to anesthetize the skin, subcutaneous tissue and periosteum. Once adequate anesthesia was achieved, a 5-mm incision was made over the posterior iliac crest and a 4 inch Ranfac bone marrow biopsy needle introduced. Using a gentle twisting motion, the needle was advanced through cortical bone into the marrow space. Once in the marrow space an aspirate was obtained into a syringe containing EDTA. Spicules were present. 5 ml of aspirate was then drawn into a heparin-containing syringe. The needle was then advanced and a core bone marrow biopsy obtained. Per standard procedure, pressure was applied to the area for 5 minutes and a pressure dressing applied.     Complications   None; patient tolerated the procedure well.     Specimen(s)   Specimen(s):   (1) aspirate into EDTA syringe   (1) aspirate into heparin syringe   (1) core biopsy     Jos?? C. Mart??nez, MD, PhD  Hematology and Oncology Fellow

## 2021-02-25 DIAGNOSIS — G3184 Mild cognitive impairment, so stated: Secondary | ICD-10-CM | POA: Diagnosis not present

## 2021-02-25 DIAGNOSIS — D649 Anemia, unspecified: Secondary | ICD-10-CM | POA: Diagnosis not present

## 2021-02-25 DIAGNOSIS — C9112 Chronic lymphocytic leukemia of B-cell type in relapse: Secondary | ICD-10-CM | POA: Diagnosis not present

## 2021-02-25 DIAGNOSIS — D696 Thrombocytopenia, unspecified: Secondary | ICD-10-CM | POA: Diagnosis not present

## 2021-02-25 LAB — MAGNESIUM: MAGNESIUM: 2 mg/dL (ref 1.6–2.6)

## 2021-02-25 LAB — CBC W/ AUTO DIFF
BASOPHILS ABSOLUTE COUNT: 0.1 10*9/L (ref 0.0–0.1)
BASOPHILS RELATIVE PERCENT: 0.9 %
EOSINOPHILS ABSOLUTE COUNT: 0.4 10*9/L (ref 0.0–0.5)
EOSINOPHILS RELATIVE PERCENT: 3.1 %
HEMATOCRIT: 27.8 % — ABNORMAL LOW (ref 34.0–44.0)
HEMOGLOBIN: 8.9 g/dL — ABNORMAL LOW (ref 11.3–14.9)
LYMPHOCYTES ABSOLUTE COUNT: 10.9 10*9/L — ABNORMAL HIGH (ref 1.1–3.6)
LYMPHOCYTES RELATIVE PERCENT: 76.1 %
MEAN CORPUSCULAR HEMOGLOBIN CONC: 32.1 g/dL (ref 32.0–36.0)
MEAN CORPUSCULAR HEMOGLOBIN: 27 pg (ref 25.9–32.4)
MEAN CORPUSCULAR VOLUME: 84.3 fL (ref 77.6–95.7)
MEAN PLATELET VOLUME: 10.4 fL (ref 6.8–10.7)
MONOCYTES ABSOLUTE COUNT: 0.7 10*9/L (ref 0.3–0.8)
MONOCYTES RELATIVE PERCENT: 4.9 %
NEUTROPHILS ABSOLUTE COUNT: 2.1 10*9/L (ref 1.8–7.8)
NEUTROPHILS RELATIVE PERCENT: 15 %
PLATELET COUNT: 86 10*9/L — ABNORMAL LOW (ref 150–450)
RED BLOOD CELL COUNT: 3.3 10*12/L — ABNORMAL LOW (ref 3.95–5.13)
RED CELL DISTRIBUTION WIDTH: 18.5 % — ABNORMAL HIGH (ref 12.2–15.2)
WBC ADJUSTED: 14.3 10*9/L — ABNORMAL HIGH (ref 3.6–11.2)

## 2021-02-25 LAB — BASIC METABOLIC PANEL
ANION GAP: 8 mmol/L (ref 5–14)
BLOOD UREA NITROGEN: 10 mg/dL (ref 9–23)
BUN / CREAT RATIO: 2
CALCIUM: 9.6 mg/dL (ref 8.7–10.4)
CHLORIDE: 101 mmol/L (ref 98–107)
CO2: 28 mmol/L (ref 20.0–31.0)
CREATININE: 4.01 mg/dL — ABNORMAL HIGH
EGFR CKD-EPI AA FEMALE: 12 mL/min/{1.73_m2} — ABNORMAL LOW (ref >=60)
EGFR CKD-EPI NON-AA FEMALE: 11 mL/min/{1.73_m2} — ABNORMAL LOW (ref >=60)
GLUCOSE RANDOM: 84 mg/dL (ref 70–179)
POTASSIUM: 3.9 mmol/L (ref 3.4–4.8)
SODIUM: 137 mmol/L (ref 135–145)

## 2021-02-25 LAB — SLIDE REVIEW

## 2021-02-25 LAB — PHOSPHORUS: PHOSPHORUS: 3.8 mg/dL (ref 2.4–5.1)

## 2021-02-25 MED ADMIN — hydrALAZINE (APRESOLINE) tablet 20 mg: 20 mg | ORAL | @ 01:00:00

## 2021-02-25 MED ADMIN — oxyCODONE (ROXICODONE) immediate release tablet 2.5 mg: 2.5 mg | ORAL | @ 12:00:00 | Stop: 2021-02-27

## 2021-02-25 MED ADMIN — acetaminophen (TYLENOL) tablet 1,000 mg: 1000 mg | ORAL | @ 17:00:00

## 2021-02-25 MED ADMIN — labetaloL (NORMODYNE,TRANDATE) injection 20 mg: 20 mg | INTRAVENOUS | @ 10:00:00 | Stop: 2021-02-25

## 2021-02-25 MED ADMIN — carvediloL (COREG) tablet 25 mg: 25 mg | ORAL | @ 14:00:00

## 2021-02-25 MED ADMIN — heparin (porcine) 1000 unit/mL injection 3,000 Units: 3000 [IU] | INTRAVENOUS | @ 13:00:00

## 2021-02-25 MED ADMIN — hydrALAZINE (APRESOLINE) tablet 10 mg: 10 mg | ORAL | @ 12:00:00 | Stop: 2021-02-25

## 2021-02-25 MED ADMIN — NIFEdipine (PROCARDIA XL) 24 hr tablet 90 mg: 90 mg | ORAL | @ 14:00:00

## 2021-02-25 MED ADMIN — acetaminophen (TYLENOL) tablet 1,000 mg: 1000 mg | ORAL | @ 01:00:00

## 2021-02-25 MED ADMIN — atorvastatin (LIPITOR) tablet 40 mg: 40 mg | ORAL | @ 17:00:00

## 2021-02-25 MED ADMIN — hydrALAZINE (APRESOLINE) tablet 20 mg: 20 mg | ORAL | @ 10:00:00 | Stop: 2021-02-25

## 2021-02-25 MED ADMIN — epoetin alfa-EPBX (RETACRIT) injection 2,000 Units: 2000 [IU] | INTRAVENOUS | @ 15:00:00

## 2021-02-25 MED ADMIN — diclofenac sodium (VOLTAREN) 1 % gel 2 g: 2 g | TOPICAL

## 2021-02-25 MED ADMIN — oxyCODONE (ROXICODONE) immediate release tablet 2.5 mg: 2.5 mg | ORAL | @ 20:00:00 | Stop: 2021-02-27

## 2021-02-25 MED ADMIN — losartan (COZAAR) tablet 100 mg: 100 mg | ORAL | @ 14:00:00

## 2021-02-25 MED ADMIN — acetaminophen (TYLENOL) tablet 1,000 mg: 1000 mg | ORAL | @ 09:00:00

## 2021-02-25 MED ADMIN — heparin (porcine) 5,000 unit/mL injection 5,000 Units: 5000 [IU] | SUBCUTANEOUS

## 2021-02-25 MED ADMIN — zanubrutinib (BRUKINSA) capsule 160 mg: 160 mg | ORAL | @ 17:00:00

## 2021-02-25 MED ADMIN — labetaloL (NORMODYNE,TRANDATE) injection 20 mg: 20 mg | INTRAVENOUS | @ 09:00:00 | Stop: 2021-02-25

## 2021-02-25 MED ADMIN — carvediloL (COREG) tablet 25 mg: 25 mg | ORAL

## 2021-02-25 MED ADMIN — furosemide (LASIX) tablet 80 mg: 80 mg | ORAL | @ 14:00:00

## 2021-02-25 MED ADMIN — paricalcitoL (ZEMPLAR) injection 5 mcg: 5 ug | INTRAVENOUS | @ 15:00:00

## 2021-02-25 MED ADMIN — diclofenac sodium (VOLTAREN) 1 % gel 2 g: 2 g | TOPICAL | @ 17:00:00

## 2021-02-25 MED ADMIN — melatonin tablet 3 mg: 3 mg | ORAL

## 2021-02-25 MED ADMIN — diclofenac sodium (VOLTAREN) 1 % gel 2 g: 2 g | TOPICAL | @ 20:00:00

## 2021-02-25 MED ADMIN — sertraline (ZOLOFT) tablet 100 mg: 100 mg | ORAL

## 2021-02-25 MED ADMIN — cefTRIAXone (ROCEPHIN) 2 g in sodium chloride 0.9 % (NS) 100 mL IVPB-connector bag: 2 g | INTRAVENOUS | @ 17:00:00 | Stop: 2021-03-02

## 2021-02-25 MED ADMIN — lidocaine (LIDODERM) 5 % patch 2 patch: 2 | TRANSDERMAL | @ 03:00:00

## 2021-02-25 MED ADMIN — heparin (porcine) 5,000 unit/mL injection 5,000 Units: 5000 [IU] | SUBCUTANEOUS | @ 17:00:00

## 2021-02-25 MED ADMIN — hydrALAZINE (APRESOLINE) tablet 50 mg: 50 mg | ORAL | @ 17:00:00

## 2021-02-25 NOTE — Unmapped (Signed)
Hematology Resident (MEDE) Progress Note    Assessment & Plan:   Cristina Fields is a 70 y.o. female with CLL, ESRD on iHD, hypertension, prior cerebellar infarct that presented as a direct admission from infusion clinic with disorientation and pain. Found to have Strep Bovis bacteremia.     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 *    Streptococcus bovis bacteremia: BCx 2 of 2 positive. Source possibly seeding from condyloma biopsy. TEE negative for endocarditis.   -CTX 2g daily (4/27-5/11, two week course)  -Outpatient referral to GI for colonoscopy at discharge  -VIR consulted to remove HD tunneled line  -ICID consulted, appreciate assistance  -Colonoscopy performed at [facility] 12/2018 with one polyp removed and was otherwise normal    CLL - Anemia - Thrombocytopenia:  Oncology history:  Follows with Dr. Lonni Fix. Diagnosed ~2012 and received fludarabine + cyclophosphamide + rituximab x6. Relapsed in 05/2014 treated with bendamustine + rituximab. Relapsed again in 2018 and started ibrutinib in 5/201. Rapid relapse in 04/2020 and was treated with rituximab and chlorambucil through November, 2021. On zanubrutinib prior to admission.   - Plan to resume zanubrutinib once brought in from home  - BMBx performed 5/6 this admission, follow up results.   -Transfuse for hgb <7, platelets <10,000    Encephalopathy: Now improved and near baseline. Worsening likely multifactorial in the setting of bacteremia per above, toxic-metabolic/uremia, and medication/pain driven superimposed on cognitive impairment at baseline (SLUMS 15 as outpatient).   -Treat infection  -Delirium precautions    Left AC OA - Neck Pain: CT scan showed only non-specific inflammatory stranding in the fat surrounding the left shoulder and axilla.   -No further imaging indicated.   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 Seen by lower GI surgery this admission with no surgical needs at this time.  -WOCN  -Outpatient pap smear to assess for cervical HPV/malignancy  -Pain control    ESRD on iHD - HTN: TThSat Dialysis. Continues to remain hypertensive despite up-titration of home meds while admitted.  -iHD while inpatient  -Coreg 25 mg BID; Nifedipine XL 90 mg daily; Hydralazine increase to 50mg  q8h; Losartan 100mg  daily  -Lasix 80 mg daily    Daily Checklist:  Diet: Regular Diet  DVT PPx: Heparin TID  Electrolytes: Replete Potassium to >/= 3.6 and Magnesium to >/= 1.8  Code Status: Full Code  Dispo: Admit to 4 ONC, dispo pending SNF placement    Team Contact Information:   Primary Team: Hematology Resident (MEDE)  Primary Resident: Coralee Pesa, MD  Resident's Pager: [phone] (Hematology Intern - White)    Interval History:   No complaints this morning. Was hypertensive overnight with headache and nausea this morning. Given 1x dose of IV labetolol. Went for dialysis this morning with pressures improving following this. Examined after dialysis and reports headache has resolved. Some mild nausea still, but has mostly subsided.    ROS otherwise negative.     Objective:   Temp:  [35.5 ??C-36.8 ??C] 35.5 ??C  Heart Rate:  [68-79] 72  Resp:  [16-18] 18  BP: (173-210)/(56-73) 197/73  SpO2:  [  94 %-99 %] 97 %    General: Lying in bed in NAD  Pulm: Normal work of breathing on RA.  CV: RRR, no m/r/g.   Skin: Tunneled HD line with dressing c/d/i, no erythema.   Ext: No LE edema.   Neuro: Oriented to person, place, and year. Mental status improving from prior exams today.   Psych: Mood and affect appropriate.     Labs/Studies: Labs and Studies from the last 24hrs per EMR and Reviewed

## 2021-02-25 NOTE — Unmapped (Signed)
HEMODIALYSIS NURSE PROCEDURE NOTE       Treatment Number:  6 Room / Station:  7    Procedure Date:  02/25/21 Device Name/Number: GUnnar RN    Total Dialysis Treatment Time:  199 Min.    CONSENT:    Written consent was obtained prior to the procedure and is detailed in the medical record.  Prior to the start of the procedure, a time out was taken and the identity of the patient was confirmed via name, medical record number and date of birth.     WEIGHT:  Hemodialysis Pre-Treatment Weights     Date/Time Pre-Treatment Weight (kg) Estimated Dry Weight (kg) Patient Goal Weight (kg) Total Goal Weight (kg)    02/25/21 1000 ??? ??? 2 kg (4 lb 6.6 oz)  per DR. Ruberwa 2.5 kg (5 lb 8.2 oz)    02/25/21 0810 48.6 kg (107 lb 2.3 oz) 48.5 kg (106 lb 14.8 oz) 0.5 kg (1 lb 1.6 oz) 1.05 kg (2 lb 5 oz)         Hemodialysis Post Treatment Weights     Date/Time Post-Treatment Weight (kg) Treatment Weight Change (kg)    02/25/21 1151 45.5 kg (100 lb 5 oz) -3.11 kg        Active Dialysis Orders (168h ago, onward)     Start     Ordered    02/23/21 1500  Hemodialysis inpatient  Every Tue,Thu,Sat      Comments: RIJ IS NOT A DIALYSIS ACCESS   Question Answer Comment   K+ 3 meq/L    Ca++ 2.5 meq/L    Bicarb 35 meq/L    Na+ 137 meq/L    Na+ Modeling no    Dialyzer F180NR    Dialysate Temperature (C) 37    BFR-As tolerated to a maximum of: 500 mL/min    DFR 800 mL/min    Duration of treatment 3.5 Hr    Dry weight (kg) 48.5 kg    Challenge dry weight (kg) no    Fluid removal (L) to edw    Tubing Adult = 142 ml    Access Site AVG    Access Site Location Left    Keep SBP >: 95        02/23/21 1459    02/16/21 0700  Hemodialysis inpatient  Every Tue,Thu,Sat,   Status:  Canceled      Comments: RIJ IS NOT A DIALYSIS ACCESS   Question Answer Comment   K+ 3 meq/L    Ca++ 2.5 meq/L    Bicarb 35 meq/L    Na+ 137 meq/L    Na+ Modeling no    Dialyzer F180NR    Dialysate Temperature (C) 37    BFR-As tolerated to a maximum of: 500 mL/min    DFR 800 mL/min Duration of treatment 3.5 Hr    Dry weight (kg) 49.5 kg    Challenge dry weight (kg) no    Fluid removal (L) to edw    Tubing Adult = 142 ml    Access Site AVG    Access Site Location Left    Keep SBP >: 95        02/14/21 1040              ASSESSMENT:  General appearance: alert, cooperative and no distress  Neurologic: Grossly normal  Lungs: diminished breath sounds bibasilar and bilaterally  Heart: regular rate and rhythm, S1, S2 normal, no murmur, click, rub or gallop  Abdomen: soft, non-tender; bowel sounds  normal; no masses,  no organomegaly      ACCESS SITE:       Hemodialysis Catheter  Right Subclavian (Active)   Site Assessment Clean;Dry;Intact 03/13/2021 1313   Proximal Lumen Status / Patency Capped 03-13-21 1313   Proximal Lumen Intervention Capped - dead end 2021-03-13 1313   Medial Lumen Status / Patency Capped 2021/03/13 1313   Medial Lumen Intervention Capped - dead end 03/13/2021 1313   Dressing Intervention No intervention needed 13-Mar-2021 1313   Dressing Status      Clean;Dry;Intact/not removed 03-13-21 1313   Site Condition No complications 03/13/2021 1313   Dressing Type CHG gel;Occlusive;Transparent 03/13/2021 1313   Dressing Change Due 02/28/21 Mar 13, 2021 1313   Line Necessity Reviewed? Y 03/13/2021 1313   Line Necessity Indications Yes - Hemodialysis Mar 13, 2021 1313   Line Necessity Reviewed With MDE 2021-03-13 1313        Arteriovenous Fistula - Vein Graft  Access Arteriovenous vein graft Left;Upper Arm (Active)   Site Assessment Clean;Dry;Intact Mar 13, 2021 1313   AV Fistula Thrill Present;Bruit Present Mar 13, 2021 1313   Status Deaccessed Mar 13, 2021 1313   Dressing Intervention No intervention needed 03-13-21 1313   Dressing Status      Clean;Dry;Intact/not removed 03/13/21 1313   Site Condition No complications 03-13-2021 1313   Dressing Gauze;Occlusive Mar 13, 2021 1230   Dressing To Be Removed (Date/Time) 4-6 hours post HD Mar 13, 2021 1230     Catheter fill volumes:         Patient Lines/Drains/Airways Status     Active Peripheral & Central Intravenous Access     Name Placement date Placement time Site Days    Peripheral IV 02/20/21 Anterior;Right Forearm 02/20/21  0809  Forearm  5               LAB RESULTS:  Lab Results   Component Value Date    NA 137 03/13/2021    K 3.9 03-13-2021    CL 101 2021-03-13    CO2 28.0 03-13-2021    BUN 10 13-Mar-2021    CREATININE 4.01 (H) 03-13-2021    GLU 84 2021-03-13    CALCIUM 9.6 03-13-2021    PHOS 3.8 03-13-21    MG 2.0 03-13-21    IRON 35 (L) 02/13/2021    LABIRON 21 02/13/2021    FERRITIN 2,051.1 (H) 02/13/2021    TIBC 163 (L) 02/13/2021     Lab Results   Component Value Date    WBC 14.3 (H) Mar 13, 2021    HGB 8.9 (L) 13-Mar-2021    HCT 27.8 (L) Mar 13, 2021    PLT 86 (L) 03/13/2021    APTT 33.0 02/15/2021        VITAL SIGNS:   Temperature     Date/Time Temp Temp src       03-13-2021 1223 36.4 ??C (97.6 ??F) Oral         Hemodynamics     Date/Time Pulse BP MAP (mmHg) Patient Position    Mar 13, 2021 1223 81 156/72 ??? Sitting    2021/03/13 1149 ??? 192/102 ??? ???    13-Mar-2021 1130 85 143/71 ??? Sitting    03/13/21 1100 82 157/73 ??? Sitting    2021/03/13 1030 87 167/78 ??? Sitting        Blood Volume Monitor     Date/Time Blood Volume Change (%) HCT HGB Critline O2 SAT %    03/13/2021 1149 ??? ??? 6.7 ???    Mar 13, 2021 1130 -19.6 % 33.3 11.3 98.9    03-13-21 1100 -16.6 % 32.1 10.9  99.3    02/25/21 1030 -13.2 % 30.8 10.5 99.6        Oxygen Therapy     Date/Time Resp SpO2 O2 Device FiO2 (%) O2 Flow Rate (L/min)    02/25/21 1223 18 ??? None (Room air) -- --    02/25/21 1130 18 ??? ??? -- --    02/25/21 1100 18 ??? ??? -- --    02/25/21 1030 18 ??? ??? -- --          Pre-Hemodialysis Assessment     Date/Time Therapy Number Dialyzer Hemodialysis Line Type All Machine Alarms Passed    02/25/21 0810 6 F-180 (98 mLs) Adult (142 m/s) Yes    Date/Time Air Detector Saline Line Double Clampled Hemo-Safe Applied Dialysis Flow (mL/min)    02/25/21 0810 Engaged ??? ??? 800 mL/min    Date/Time Verify Priming Solution Priming Volume Hemodialysis Independent pH Hemodialysis Machine Conductivity (mS/cm)    02/25/21 0810 0.9% NS 300 mL ??? 13.7 mS/cm    Date/Time Hemodialysis Independent Conductivity (mS/cm) Bicarb Conductivity Residual Bleach Negative Total Chlorine    02/25/21 0810 13.7 mS/cm -- Yes 0          Hemodialysis Treatment     Date/Time Blood Flow Rate (mL/min) Arterial Pressure (mmHg) Venous Pressure (mmHg) Transmembrane Pressure (mmHg)    02/25/21 1149 0 mL/min 61 mmHg 87 mmHg 23 mmHg    02/25/21 1130 400 mL/min -120 mmHg 214 mmHg 47 mmHg    02/25/21 1100 400 mL/min -104 mmHg 214 mmHg 48 mmHg    02/25/21 1030 400 mL/min -106 mmHg 210 mmHg 41 mmHg    02/25/21 1000 400 mL/min -93 mmHg 213 mmHg 39 mmHg    02/25/21 0945 400 mL/min -89 mmHg 219 mmHg 27 mmHg    02/25/21 0930 400 mL/min -90 mmHg 214 mmHg 29 mmHg    02/25/21 0900 400 mL/min -81 mmHg 214 mmHg 26 mmHg    02/25/21 0830 350 mL/min -39 mmHg 180 mmHg 34 mmHg    Date/Time Ultrafiltration Rate (mL/hr) Ultrafiltrate Removed (mL) Dialysate Flow Rate (mL/min) KECN (Kecn)    02/25/21 1149 0 mL/hr 2088 mL 800 ml/min ???    02/25/21 1130 750 mL/hr 1888 mL 800 ml/min ???    02/25/21 1100 1140 mL/hr 1333 mL 800 ml/min ???    02/25/21 1030 900 mL/hr 855 mL 800 ml/min ???    02/25/21 1000 750 mL/hr 470 mL 800 ml/min ???    02/25/21 0945 310 mL/hr 350 mL 800 ml/min ???    02/25/21 0930 310 mL/hr 274 mL 800 ml/min ???    02/25/21 0900 290 mL/hr 146 mL 800 ml/min ???    02/25/21 0830 290 mL/hr 6 mL 800 ml/min ???        Hemodialysis Treatment Comments     Date/Time Intra-Hemodialysis Comments    02/25/21 1149 rinseback blood, pt stated feeling sick, N/V    02/25/21 1130 vss. tolerating tx well.    02/25/21 1100 vss.Dr Eulah Pont at bedside.    02/25/21 1030 vss. Improved BP.    02/25/21 1000 vss. BP improving.    02/25/21 0945 all PO BP meds given as ordered. will monitor pt closely.    02/25/21 0930 vss, pt's BP remains elevated. will give all po BP meds.    02/25/21 0900 vss, pt sleeping    02/25/21 0830 tx started using AVG LUE.        Post Treatment     Date/Time Rinseback Volume (mL) On Line Clearance: spKt/V Total Liters  Processed (L/min) Dialyzer Clearance    02/25/21 1151 300 mL 1.8 spKt/V 76.6 L/min Moderately streaked        Post Hemodialysis Treatment Comments     Date/Time Post-Hemodialysis Comments    02/25/21 1151 awake, alert. NAD        Hemodialysis I/O     Date/Time Total Hemodialysis Replacement Volume (mL) Total Ultrafiltrate Output (mL)    02/25/21 1151 ??? 1500 mL          [id number] - Medicaitons Given During Treatment  (last 4 hrs)         CELINA E WEBER, RN       Medication Name Action Time Action Route Rate Dose User     acetaminophen (TYLENOL) tablet 1,000 mg 02/25/21 1306 Given Oral  1,000 mg Celina E Weber, RN     atorvastatin (LIPITOR) tablet 40 mg 02/25/21 1300 Given Oral  40 mg Celina E Weber, RN     cefTRIAXone (ROCEPHIN) 2 g in sodium chloride 0.9 % (NS) 100 mL IVPB-connector bag 02/25/21 1300 New Bag Intravenous 200 mL/hr 2 g Celina E Weber, RN     cefTRIAXone (ROCEPHIN) 2 g in sodium chloride 0.9 % (NS) 100 mL IVPB-connector bag 02/25/21 1400 Stopped Intravenous   Celina E Weber, RN     diclofenac sodium (VOLTAREN) 1 % gel 2 g 02/25/21 1300 Given Topical  2 g Celina E Weber, RN     heparin (porcine) 5,000 unit/mL injection 5,000 Units 02/25/21 1301 Given Subcutaneous  5,000 Units Celina E Weber, RN     hydrALAZINE (APRESOLINE) tablet 50 mg 02/25/21 1306 Given Oral  50 mg Celina E Weber, RN     lidocaine (LIDODERM) 5 % patch 2 patch 02/25/21 1301 Patch Removed Transdermal   Celina E Weber, RN     zanubrutinib (BRUKINSA) capsule 160 mg 02/25/21 1314 Given Oral  160 mg Vaughan Browner, RN          Marry Guan, RN       Medication Name Action Time Action Route Rate Dose User     epoetin alfa-EPBX (RETACRIT) injection 2,000 Units 02/25/21 1109 Given Intravenous  2,000 Units Marry Guan, RN     paricalcitoL [facility]) injection 5 mcg 02/25/21 1109 Given Intravenous  5 mcg Marry Guan, RN                  Patient tolerated treatment in a  Dialysis Recliner.

## 2021-02-25 NOTE — Unmapped (Signed)
[facility] Nephrology Hemodialysis Procedure Note     02/25/2021    Cristina Fields was seen and examined on hemodialysis    CHIEF COMPLAINT: End Stage Renal Disease     INTERVAL HISTORY: Tolerating without issue - BPs elevated but did not receive usual BP meds prior to treatment - given during HD      DIALYSIS TREATMENT DATA:  Estimated Dry Weight (kg): 48.5 kg (106 lb 14.8 oz)  Patient Goal Weight (kg): 2 kg (4 lb 6.6 oz) (per DR. Ruberwa)  Dialyzer: F-180 (98 mLs)  Dialysis Bath  Bath: 3 K+ / 2.5 Ca+  Dialysate Na (mEq/L): 137 mEq/L  Dialysate HCO3 (mEq/L): 35 mEq/L  Dialysate Total Buffer HCO3 (mEq/L): 35 mEq/L  Blood Flow Rate (mL/min): 400 mL/min  Dialysis Flow (mL/min): 800 mL/min    PHYSICAL EXAM:  Vitals:  Temp:  [35.5 ??C (95.9 ??F)-36.8 ??C (98.2 ??F)] 35.5 ??C (95.9 ??F)  Heart Rate:  [68-87] 85  BP: (143-211)/(56-79) 143/71  MAP (mmHg):  [90-105] 101  Weights:  Pre-Treatment Weight (kg): 48.6 kg (107 lb 2.3 oz)    General: Appearing in no acute distress  Pulmonary: clear to auscultation  Cardiovascular: regular rate and rhythm  Extremities: no significant  edema  Access: LUE AV graft   Dialyzing in chair    LAB DATA:  Lab Results   Component Value Date    NA 137 02/25/2021    K 3.9 02/25/2021    CL 101 02/25/2021    CO2 28.0 02/25/2021    BUN 10 02/25/2021    CREATININE 4.01 (H) 02/25/2021      Lab Results   Component Value Date    HCT 27.8 (L) 02/25/2021    WBC 14.3 (H) 02/25/2021        ASSESSMENT/PLAN:  End Stage Renal Disease on Intermittent Hemodialysis:  - UF goal: 2L as tolerated  - Adjust medications for a GFR <10  - Avoid nephrotoxic agents     Bone Mineral Metabolism:  Lab Results   Component Value Date    CALCIUM 9.6 02/25/2021    CALCIUM 9.0 02/24/2021    Lab Results   Component Value Date    ALBUMIN 2.9 (L) 02/23/2021    ALBUMIN 2.7 (L) 02/20/2021      Lab Results   Component Value Date    PHOS 3.8 02/25/2021    PHOS 2.0 (L) 02/24/2021    No results found for: PTH   - Labs appropriate, no changes.    Anemia:   Lab Results   Component Value Date    HGB 8.9 (L) 02/25/2021    HGB 8.9 (L) 02/24/2021    HGB 8.7 (L) 02/23/2021    Iron Saturation (%)   Date Value Ref Range Status   02/13/2021 21 % Final      Lab Results   Component Value Date    FERRITIN 2,051.1 (H) 02/13/2021       Continue intravenous Epogen/Retacrit 2K units with each treatment.       Brooke Pace, MD  [place] Division of Nephrology & Hypertension

## 2021-02-25 NOTE — Unmapped (Signed)
Pt arrived to dialysis, awake, alert, NAD. With elevated BP. Dr. Hillery Hunter aware, we will challenge UF. Proceed with planned procedure.    Hemodialysis inpatient  Every Tue,Thu,Sat      Comments: RIJ IS NOT A DIALYSIS ACCESS   Question Answer Comment   K+ 3 meq/L    Ca++ 2.5 meq/L    Bicarb 35 meq/L    Na+ 137 meq/L    Na+ Modeling no    Dialyzer F180NR    Dialysate Temperature (C) 37    BFR-As tolerated to a maximum of: 500 mL/min    DFR 800 mL/min    Duration of treatment 3.5 Hr    Dry weight (kg) 48.5 kg    Challenge dry weight (kg) no    Fluid removal (L) to edw    Tubing Adult = 142 ml    Access Site AVG    Access Site Location Left    Keep SBP >: 95

## 2021-02-25 NOTE — Unmapped (Cosign Needed)
VIR consulted for tunneled HD catheter removal in the setting of Strep bovis bacteremia. Patient underwent HD via left upper extremity AVG today without issue. Will proceed with tunneled HD catheter removal.

## 2021-02-25 NOTE — Unmapped (Signed)
Patient alert, forgetful at times, afebrile, free from fall, due medications given, patient has been consistently having high blood pressures, team made aware with orders carried out, given labetolol x2, dressing changed, BM x1, for  HD today, will continue to monitor.     Problem: Adult Inpatient Plan of Care  Goal: Plan of Care Review  02/25/2021 0604 by Otho Perl, RN  Outcome: Progressing  Goal: Absence of Hospital-Acquired Illness or Injury  02/25/2021 0604 by Otho Perl, RN  Outcome: Progressing  Intervention: Identify and Manage Fall Risk  Taken 02/24/2021 2012 by Otho Perl, RN  Safety Interventions:   bed alarm   fall reduction program maintained   lighting adjusted for tasks/safety   low bed  Intervention: Prevent and Manage VTE (Venous Thromboembolism) Risk  Recent Flowsheet Documentation  Taken 02/24/2021 2012 by Otho Perl, RN  Activity Management: activity adjusted per tolerance  VTE Prevention/Management: anticoagulant therapy  Goal: Optimal Comfort and Wellbeing  02/25/2021 0604 by Otho Perl, RN  Outcome: Progressing  Problem: Self-Care Deficit  Goal: Improved Ability to Complete Activities of Daily Living  02/25/2021 0604 by Otho Perl, RN  Outcome: Progressing   Problem: Impaired Wound Healing  Goal: Optimal Wound Healing  02/25/2021 0604 by Otho Perl, RN  Outcome: Progressing  Intervention: Promote Wound Healing  Recent Flowsheet Documentation  Taken 02/24/2021 2012 by Otho Perl, RN  Activity Management: activity adjusted per tolerance   Problem: Skin Injury Risk Increased  Goal: Skin Health and Integrity  02/25/2021 0604 by Otho Perl, RN  Outcome: Progressing    Problem: Skin Injury Risk Increased  Goal: Skin Health and Integrity  02/25/2021 0630 by Otho Perl, RN  Outcome: Progressing  02/25/2021 0604 by Otho Perl, RN  Outcome: Progressing  Intervention: Optimize Skin Protection  Taken 02/24/2021 2012 by Otho Perl, RN  Pressure Reduction Techniques: frequent weight shift encouraged  Pressure Reduction Devices: pressure-redistributing mattress utilized

## 2021-02-26 DIAGNOSIS — C9112 Chronic lymphocytic leukemia of B-cell type in relapse: Secondary | ICD-10-CM | POA: Diagnosis not present

## 2021-02-26 DIAGNOSIS — D649 Anemia, unspecified: Secondary | ICD-10-CM | POA: Diagnosis not present

## 2021-02-26 DIAGNOSIS — D696 Thrombocytopenia, unspecified: Secondary | ICD-10-CM | POA: Diagnosis not present

## 2021-02-26 DIAGNOSIS — G3184 Mild cognitive impairment, so stated: Secondary | ICD-10-CM | POA: Diagnosis not present

## 2021-02-26 LAB — HEPATIC FUNCTION PANEL
ALBUMIN: 2.6 g/dL — ABNORMAL LOW (ref 3.4–5.0)
ALKALINE PHOSPHATASE: 154 U/L — ABNORMAL HIGH (ref 46–116)
ALT (SGPT): 15 U/L (ref 10–49)
AST (SGOT): 32 U/L (ref <=34)
BILIRUBIN DIRECT: 0.1 mg/dL (ref 0.00–0.30)
BILIRUBIN TOTAL: 0.2 mg/dL — ABNORMAL LOW (ref 0.3–1.2)
PROTEIN TOTAL: 4.9 g/dL — ABNORMAL LOW (ref 5.7–8.2)

## 2021-02-26 LAB — BASIC METABOLIC PANEL
ANION GAP: 3 mmol/L — ABNORMAL LOW (ref 5–14)
BLOOD UREA NITROGEN: 13 mg/dL (ref 9–23)
BUN / CREAT RATIO: 4
CALCIUM: 9.4 mg/dL (ref 8.7–10.4)
CHLORIDE: 102 mmol/L (ref 98–107)
CO2: 31 mmol/L (ref 20.0–31.0)
CREATININE: 3.12 mg/dL — ABNORMAL HIGH
EGFR CKD-EPI AA FEMALE: 17 mL/min/{1.73_m2} — ABNORMAL LOW (ref >=60)
EGFR CKD-EPI NON-AA FEMALE: 14 mL/min/{1.73_m2} — ABNORMAL LOW (ref >=60)
GLUCOSE RANDOM: 135 mg/dL (ref 70–179)
POTASSIUM: 3.4 mmol/L (ref 3.4–4.8)
SODIUM: 136 mmol/L (ref 135–145)

## 2021-02-26 LAB — MAGNESIUM: MAGNESIUM: 1.8 mg/dL (ref 1.6–2.6)

## 2021-02-26 LAB — CBC W/ AUTO DIFF
BASOPHILS ABSOLUTE COUNT: 0.2 10*9/L — ABNORMAL HIGH (ref 0.0–0.1)
BASOPHILS RELATIVE PERCENT: 0.7 %
EOSINOPHILS ABSOLUTE COUNT: 0.5 10*9/L (ref 0.0–0.5)
EOSINOPHILS RELATIVE PERCENT: 2.1 %
HEMATOCRIT: 27.5 % — ABNORMAL LOW (ref 34.0–44.0)
HEMOGLOBIN: 9 g/dL — ABNORMAL LOW (ref 11.3–14.9)
LYMPHOCYTES ABSOLUTE COUNT: 20.5 10*9/L — ABNORMAL HIGH (ref 1.1–3.6)
LYMPHOCYTES RELATIVE PERCENT: 81.9 %
MEAN CORPUSCULAR HEMOGLOBIN CONC: 32.6 g/dL (ref 32.0–36.0)
MEAN CORPUSCULAR HEMOGLOBIN: 27.6 pg (ref 25.9–32.4)
MEAN CORPUSCULAR VOLUME: 84.7 fL (ref 77.6–95.7)
MEAN PLATELET VOLUME: 10.1 fL (ref 6.8–10.7)
MONOCYTES ABSOLUTE COUNT: 1.5 10*9/L — ABNORMAL HIGH (ref 0.3–0.8)
MONOCYTES RELATIVE PERCENT: 5.9 %
NEUTROPHILS ABSOLUTE COUNT: 2.4 10*9/L (ref 1.8–7.8)
NEUTROPHILS RELATIVE PERCENT: 9.4 %
PLATELET COUNT: 89 10*9/L — ABNORMAL LOW (ref 150–450)
RED BLOOD CELL COUNT: 3.25 10*12/L — ABNORMAL LOW (ref 3.95–5.13)
RED CELL DISTRIBUTION WIDTH: 18.8 % — ABNORMAL HIGH (ref 12.2–15.2)
WBC ADJUSTED: 25 10*9/L — ABNORMAL HIGH (ref 3.6–11.2)

## 2021-02-26 LAB — PHOSPHORUS: PHOSPHORUS: 3 mg/dL (ref 2.4–5.1)

## 2021-02-26 LAB — SLIDE REVIEW

## 2021-02-26 MED ADMIN — carvediloL (COREG) tablet 25 mg: 25 mg | ORAL | @ 14:00:00

## 2021-02-26 MED ADMIN — heparin (porcine) 5,000 unit/mL injection 5,000 Units: 5000 [IU] | SUBCUTANEOUS | @ 14:00:00

## 2021-02-26 MED ADMIN — hydrALAZINE (APRESOLINE) tablet 50 mg: 50 mg | ORAL | @ 16:00:00

## 2021-02-26 MED ADMIN — carvediloL (COREG) tablet 25 mg: 25 mg | ORAL

## 2021-02-26 MED ADMIN — diclofenac sodium (VOLTAREN) 1 % gel 2 g: 2 g | TOPICAL | @ 09:00:00

## 2021-02-26 MED ADMIN — hydrALAZINE (APRESOLINE) tablet 50 mg: 50 mg | ORAL | @ 22:00:00

## 2021-02-26 MED ADMIN — melatonin tablet 3 mg: 3 mg | ORAL | @ 22:00:00

## 2021-02-26 MED ADMIN — losartan (COZAAR) tablet 100 mg: 100 mg | ORAL | @ 14:00:00

## 2021-02-26 MED ADMIN — cefTRIAXone (ROCEPHIN) 2 g in sodium chloride 0.9 % (NS) 100 mL IVPB-connector bag: 2 g | INTRAVENOUS | @ 14:00:00 | Stop: 2021-03-02

## 2021-02-26 MED ADMIN — furosemide (LASIX) tablet 80 mg: 80 mg | ORAL | @ 14:00:00

## 2021-02-26 MED ADMIN — sertraline (ZOLOFT) tablet 100 mg: 100 mg | ORAL

## 2021-02-26 MED ADMIN — hydrALAZINE (APRESOLINE) tablet 50 mg: 50 mg | ORAL | @ 09:00:00 | Stop: 2021-02-26

## 2021-02-26 MED ADMIN — diclofenac sodium (VOLTAREN) 1 % gel 2 g: 2 g | TOPICAL

## 2021-02-26 MED ADMIN — oxyCODONE (ROXICODONE) immediate release tablet 2.5 mg: 2.5 mg | ORAL | @ 09:00:00 | Stop: 2021-02-27

## 2021-02-26 MED ADMIN — hydrALAZINE (APRESOLINE) tablet 50 mg: 50 mg | ORAL | @ 03:00:00

## 2021-02-26 MED ADMIN — acetaminophen (TYLENOL) tablet 1,000 mg: 1000 mg | ORAL | @ 09:00:00

## 2021-02-26 MED ADMIN — diclofenac sodium (VOLTAREN) 1 % gel 2 g: 2 g | TOPICAL | @ 16:00:00

## 2021-02-26 MED ADMIN — oxyCODONE (ROXICODONE) immediate release tablet 2.5 mg: 2.5 mg | ORAL | @ 18:00:00 | Stop: 2021-02-27

## 2021-02-26 MED ADMIN — diclofenac sodium (VOLTAREN) 1 % gel 2 g: 2 g | TOPICAL | @ 22:00:00

## 2021-02-26 MED ADMIN — heparin (porcine) 5,000 unit/mL injection 5,000 Units: 5000 [IU] | SUBCUTANEOUS

## 2021-02-26 MED ADMIN — oxyCODONE (ROXICODONE) immediate release tablet 2.5 mg: 2.5 mg | ORAL | Stop: 2021-02-27

## 2021-02-26 MED ADMIN — atorvastatin (LIPITOR) tablet 40 mg: 40 mg | ORAL | @ 14:00:00

## 2021-02-26 MED ADMIN — NIFEdipine (PROCARDIA XL) 24 hr tablet 90 mg: 90 mg | ORAL | @ 14:00:00

## 2021-02-26 MED ADMIN — acetaminophen (TYLENOL) tablet 1,000 mg: 1000 mg | ORAL | @ 03:00:00

## 2021-02-26 MED ADMIN — lidocaine (LIDODERM) 5 % patch 2 patch: 2 | TRANSDERMAL | @ 03:00:00

## 2021-02-26 MED ADMIN — melatonin tablet 3 mg: 3 mg | ORAL

## 2021-02-26 MED ADMIN — acetaminophen (TYLENOL) tablet 1,000 mg: 1000 mg | ORAL | @ 18:00:00

## 2021-02-26 NOTE — Unmapped (Signed)
Pt alert and orientedx3, PRN oxy given twice for pain. Meals encouraged and ordered. Falls precautions maintained and no falls during shift.  Q2 turns maintained. Falls precautions maintained. No acute events.  Problem: Adult Inpatient Plan of Care  Goal: Plan of Care Review  Outcome: Ongoing - Unchanged  Goal: Patient-Specific Goal (Individualized)  Outcome: Ongoing - Unchanged  Goal: Absence of Hospital-Acquired Illness or Injury  Outcome: Ongoing - Unchanged  Intervention: Identify and Manage Fall Risk  Recent Flowsheet Documentation  Taken 02/25/2021 2000 by Phineas Douglas, RN  Safety Interventions:   bed alarm   lighting adjusted for tasks/safety   low bed   fall reduction program maintained   family at bedside   nonskid shoes/slippers when out of bed  Intervention: Prevent Skin Injury  Recent Flowsheet Documentation  Taken 02/25/2021 2000 by Phineas Douglas, RN  Skin Protection:   adhesive use limited   cleansing with dimethicone incontinence wipes  Intervention: Prevent and Manage VTE (Venous Thromboembolism) Risk  Recent Flowsheet Documentation  Taken 02/25/2021 2000 by Phineas Douglas, RN  Activity Management: activity adjusted per tolerance  Goal: Optimal Comfort and Wellbeing  Outcome: Ongoing - Unchanged  Goal: Readiness for Transition of Care  Outcome: Ongoing - Unchanged  Goal: Rounds/Family Conference  Outcome: Ongoing - Unchanged     Problem: Self-Care Deficit  Goal: Improved Ability to Complete Activities of Daily Living  Outcome: Ongoing - Unchanged  Intervention: Promote Activity and Functional Independence  Recent Flowsheet Documentation  Taken 02/25/2021 2000 by Phineas Douglas, RN  Self-Care Promotion: independence encouraged     Problem: Fall Injury Risk  Goal: Absence of Fall and Fall-Related Injury  Outcome: Ongoing - Unchanged  Intervention: Identify and Manage Contributors  Recent Flowsheet Documentation  Taken 02/25/2021 2000 by Phineas Douglas, RN  Self-Care Promotion: independence encouraged  Intervention: Promote Injury-Free Environment  Recent Flowsheet Documentation  Taken 02/25/2021 2000 by Phineas Douglas, RN  Safety Interventions:   bed alarm   lighting adjusted for tasks/safety   low bed   fall reduction program maintained   family at bedside   nonskid shoes/slippers when out of bed     Problem: Impaired Wound Healing  Goal: Optimal Wound Healing  Outcome: Ongoing - Unchanged  Intervention: Promote Wound Healing  Recent Flowsheet Documentation  Taken 02/25/2021 2000 by Phineas Douglas, RN  Activity Management: activity adjusted per tolerance     Problem: Device-Related Complication Risk (Hemodialysis)  Goal: Safe, Effective Therapy Delivery  Outcome: Ongoing - Unchanged     Problem: Hemodynamic Instability (Hemodialysis)  Goal: Effective Tissue Perfusion  Outcome: Ongoing - Unchanged     Problem: Infection (Hemodialysis)  Goal: Absence of Infection Signs and Symptoms  Outcome: Ongoing - Unchanged     Problem: Skin Injury Risk Increased  Goal: Skin Health and Integrity  Outcome: Ongoing - Unchanged  Intervention: Optimize Skin Protection  Recent Flowsheet Documentation  Taken 02/25/2021 2000 by Phineas Douglas, RN  Pressure Reduction Techniques:   frequent weight shift encouraged   weight shift assistance provided  Pressure Reduction Devices: pressure-redistributing mattress utilized  Skin Protection:   adhesive use limited   cleansing with dimethicone incontinence wipes

## 2021-02-26 NOTE — Unmapped (Addendum)
Outpatient Follow Up Needs:  [ ]  Referral placed to [facility] for consideration of repeat Colonoscopy given Strep Bovis Bacteremia. Last Colonoscopy in 2020 with single polyp, but likely deserves repeat in setting of Strep Bovis association with GI malignancy.   [ ]  Referral placed to [facility] given extensive Rectal Condyloma. Patient should have a pap smear and HPV testing to assess for any cervical malignancy.   [ ]  Home anti-hypertensives were up-titrated while admitted, but patient continues to be hypertensive to the 150-160's systolic. Consider if uptitration of amlodipine is indicated.   [ ]  Repeat bone marrow biopsy outpatient to determine if persistent anemia & thrombocytopenia are due to failure on zanubrutinib.    Hospital Course:   Cristina Fields is a 70yo woman with a history of CLL (on Zanubrutinib), ESRD on iHD, HTN, prior cerebellar stroke, cognitive impairment, who presented to [facility] with AMS ultimately found to have Strep Bovis Bacteremia. Hospital course detailed by problem below:    Strep Bovis Bacteremia: Patient presented with altered mental status and significant encephalopathy (see below) as well as increased pain in her significant rectal condyloma. Recently had undergone EUA and biopsy of condyloma prior to admission so initially thought biopsy site could be infected so she was covered with Ciprofloxacin and Metronidazole initially. Remained afebrile but was eventually found to have 2/2 blood cultures positive for Strep Bovis which is felt to be the driver of her encephalopathy. Source of Strep Bovis unclear, but potentially could be related to recent biopsy of rectal condyloma. Colonoscopy in 2020 at [facility] essentially normal with a benign polyp removed. Immunocompromised ID was consulted and she was started on Ceftriaxone and completed a two week total course. TEE negative for endocarditis. Her HD tunneled line was removed by VIR this admission (dialyzing through L UE fistula). 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     Grade 3 Encephalopathy: Presented with grade 3 encephalopathy. Initially there was some concern for intracranial process. Head CT on admission with evidence of prior cerebellar infarct. When mental status did not continue to improve, brain MRI was pursued which on first read showed signs of possible acute SAH. Neurosurgery was consulted overnight who felt low likelihood of SAH given reassuring neurologic exam. MRI ultimately over-read by attending with low concern for acute bleed. MRA performed which showed no aneurysm or evidence of bleed. Ultimately patient's encephalopathy determined to be driven largely by bacteremia. Also additional multi-factorial etiologies contributed as well with component of toxic-metabolic/uremic in setting of CKD, medication effects (increased opioid use at home prior to admission), and superimposed acute illness delirium from bacteremia on baseline cognitive impairment/dementia (SLUMS 15 in outpatient setting). Patient's mental status returned to baseline per her husband with treatment of her bacteremia and supportive care.     CLL - Anemia - Thrombocytopenia - Leukocytosis: Follows with Dr. Lonni Fix at [facility]. Diagnosed ~2012 and received fludarabine + cyclophosphamide + rituximab x6. Relapsed in 05/2014 treated with bendamustine + rituximab. Relapsed again in 2018 and started ibrutinib in 5/201. Rapid relapse in 04/2020 and was treated with rituximab and chlorambucil through November, 2021. On zanubrutinib prior to admission. Zanubrutinib held on admission initially given possible procedures and its platelet inhibition effects. Resumed this admission and plans to continue this on discharge. Counts remained stable without need for transfusion. Underwent bone marrow biopsy this admission on 5/6 for persistent anemia and thrombocytopenia. However, he demonstrated hypercellularity of her  bone marrow biopsy may have been a physiologic response rather than failure on zanubrutinib as it was obtained while her peripheral absolute lymphocyte count was at its lowest. Recommended to repeat bone marrow biopsy at a later time outpatient.    Left AC OA - Neck Pain: Patient reporting consistent left sided neck, clavicular, and shoulder pain this admission. Given bacteremia and c/f infection in an immunocompromised host, ICID recommended further imaging. CT of the neck and shoulder with non-specific inflammatory stranding of left shoulder and axilla with no evidence of infectious causes. Clavicle film with osteoarthritis. Managed with pain control with lidocaine patches, prn oxycodone, and voltaren gel.     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 Initial concern on admission that her biopsy site could be infected leading to her AMS, however was evaluated by lower GI surgery who felt biopsy site healing appropriately without evidence of infection. No surgical interventions were felt to be necessary at this time and no other imaging tests were warranted per lower GI surgery. Wound care nursing evaluated patient and provided dressing recs. At discharge, she was referred to [facility] in order to consider pap smear and HPV testing to assess for any cervical involvement. This could also be undertaken by her PCP as well but should be performed if possible.     ESRD on iHD (TThSat): Patient was provided dialysis while admitted through LUE AVF. Her tunneled HD line, which was no longer being used for dialysis was removed by VIR this admission on 5/9. She was noted to have significant HTN that was difficult to control despite up-titrating her home anti-hypertensives. At discharge she was on maximum doses of Nifedipine, Losartan, Hydralazine, and Coreg. She was also re-started on home Amlodipine at 5mg . Discharged with instructions to PCP to further optimize in outpatient setting (can further increase amlodipine).

## 2021-02-26 NOTE — Unmapped (Signed)
Pt alert and orientedx3, PRN oxy given twice for pain. Husband at bedside. Meals encouraged. Falls precautions maintained and no falls during shift.  Q2 turns maintained. Hypertensive with systolic in 200s before dialysis. Post dialysis and daily medications came down to 150s, MD aware. Falls precautions maintained. No acute events.    Problem: Adult Inpatient Plan of Care  Goal: Plan of Care Review  Outcome: Progressing  Goal: Patient-Specific Goal (Individualized)  Outcome: Progressing  Goal: Absence of Hospital-Acquired Illness or Injury  Outcome: Progressing  Intervention: Identify and Manage Fall Risk  Recent Flowsheet Documentation  Taken 02/25/2021 1800 by Vaughan Browner, RN  Safety Interventions: bed alarm  Taken 02/25/2021 1531 by Vaughan Browner, RN  Safety Interventions: bed alarm  Taken 02/25/2021 1450 by Vaughan Browner, RN  Safety Interventions: bed alarm  Taken 02/25/2021 1313 by Vaughan Browner, RN  Safety Interventions:   bed alarm   commode/urinal/bedpan at bedside   environmental modification   fall reduction program maintained   lighting adjusted for tasks/safety   low bed  Intervention: Prevent Skin Injury  Recent Flowsheet Documentation  Taken 02/25/2021 1313 by Vaughan Browner, RN  Skin Protection: adhesive use limited  Intervention: Prevent and Manage VTE (Venous Thromboembolism) Risk  Recent Flowsheet Documentation  Taken 02/25/2021 1313 by Vaughan Browner, RN  Activity Management: activity adjusted per tolerance  VTE Prevention/Management: anticoagulant therapy  Goal: Optimal Comfort and Wellbeing  Outcome: Progressing  Goal: Readiness for Transition of Care  Outcome: Progressing  Goal: Rounds/Family Conference  Outcome: Progressing

## 2021-02-26 NOTE — Unmapped (Signed)
Hematology Resident (MEDE) Progress Note    Assessment & Plan:   Cristina Fields is a 70 y.o. female with CLL, ESRD on iHD, hypertension, prior cerebellar infarct that presented as a direct admission from infusion clinic with disorientation and pain. Found to have Strep Bovis bacteremia.     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 *    Streptococcus bovis bacteremia: BCx 2 of 2 positive. Source possibly seeding from condyloma biopsy. TEE negative for endocarditis.   -CTX 2g daily (4/27-5/11, two week course)  -Outpatient referral to GI for colonoscopy at discharge  -VIR consulted to remove HD tunneled line  -ICID consulted, appreciate assistance  -Colonoscopy performed at [facility] 12/2018 with one polyp removed and was otherwise normal    CLL - Anemia - Thrombocytopenia:  Oncology history:  Follows with Dr. Lonni Fix. Diagnosed ~2012 and received fludarabine + cyclophosphamide + rituximab x6. Relapsed in 05/2014 treated with bendamustine + rituximab. Relapsed again in 2018 and started ibrutinib in 5/201. Rapid relapse in 04/2020 and was treated with rituximab and chlorambucil through November, 2021. On zanubrutinib prior to admission.   - Resumed zanubrutinib   - BMBx performed 5/6 this admission, follow up results.   -Transfuse for hgb <7, platelets <10,000    Encephalopathy: Now improved and near baseline. Worsening likely multifactorial in the setting of bacteremia per above, toxic-metabolic/uremia, and medication/pain driven superimposed on cognitive impairment at baseline (SLUMS 15 as outpatient).   -Treat infection  -Delirium precautions    Left AC OA - Neck Pain: CT scan showed only non-specific inflammatory stranding in the fat surrounding the left shoulder and axilla.   -No further imaging indicated.   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 Seen by lower GI surgery this admission with no surgical needs at this time.  -WOCN  -Outpatient pap smear to assess for cervical HPV/malignancy  -Pain control    ESRD on iHD - HTN: TThSat Dialysis. Continues to remain hypertensive despite up-titration of home meds while admitted.  -iHD while inpatient  -Coreg 25 mg BID; Nifedipine XL 90 mg daily; Hydralazine increase to 50mg  q6h; Losartan 100mg  daily  -Lasix 80 mg daily    Daily Checklist:  Diet: Regular Diet  DVT PPx: Heparin TID  Electrolytes: Replete Potassium to >/= 3.6 and Magnesium to >/= 1.8  Code Status: Full Code  Dispo: Admit to 4 ONC, dispo pending SNF placement    Team Contact Information:   Primary Team: Hematology Resident (MEDE)  Primary Resident: Coralee Pesa, MD  Resident's Pager: [phone] (Hematology Intern - White)    Interval History:   No complaints this morning. Sleeping comfortably. Working towards SNF discharge.     ROS otherwise negative.     Objective:   Temp:  [36.3 ??C-37 ??C] 36.7 ??C  Heart Rate:  [70-87] 72  Resp:  [18] 18  BP: (132-211)/(43-102) 171/68  SpO2:  [94 %-98 %] 96 %    General: Lying in bed in NAD  Pulm: Normal work of breathing on RA.  CV: Regular rate.   Neuro: Alert  Psych: Mood and affect appropriate.  Labs/Studies: Labs and Studies from the last 24hrs per EMR and Reviewed

## 2021-02-27 DIAGNOSIS — Z4901 Encounter for fitting and adjustment of extracorporeal dialysis catheter: Secondary | ICD-10-CM | POA: Diagnosis not present

## 2021-02-27 DIAGNOSIS — G9340 Encephalopathy, unspecified: Secondary | ICD-10-CM | POA: Diagnosis not present

## 2021-02-27 DIAGNOSIS — C9112 Chronic lymphocytic leukemia of B-cell type in relapse: Secondary | ICD-10-CM | POA: Diagnosis not present

## 2021-02-27 DIAGNOSIS — D696 Thrombocytopenia, unspecified: Secondary | ICD-10-CM | POA: Diagnosis not present

## 2021-02-27 DIAGNOSIS — A491 Streptococcal infection, unspecified site: Secondary | ICD-10-CM | POA: Diagnosis not present

## 2021-02-27 LAB — CBC W/ AUTO DIFF
BASOPHILS ABSOLUTE COUNT: 0.2 10*9/L — ABNORMAL HIGH (ref 0.0–0.1)
BASOPHILS RELATIVE PERCENT: 0.5 %
EOSINOPHILS ABSOLUTE COUNT: 0.6 10*9/L — ABNORMAL HIGH (ref 0.0–0.5)
EOSINOPHILS RELATIVE PERCENT: 2 %
HEMATOCRIT: 28 % — ABNORMAL LOW (ref 34.0–44.0)
HEMOGLOBIN: 9.1 g/dL — ABNORMAL LOW (ref 11.3–14.9)
LYMPHOCYTES ABSOLUTE COUNT: 26.5 10*9/L — ABNORMAL HIGH (ref 1.1–3.6)
LYMPHOCYTES RELATIVE PERCENT: 89.8 %
MEAN CORPUSCULAR HEMOGLOBIN CONC: 32.5 g/dL (ref 32.0–36.0)
MEAN CORPUSCULAR HEMOGLOBIN: 27.7 pg (ref 25.9–32.4)
MEAN CORPUSCULAR VOLUME: 85 fL (ref 77.6–95.7)
MEAN PLATELET VOLUME: 9.8 fL (ref 6.8–10.7)
MONOCYTES ABSOLUTE COUNT: 0.1 10*9/L — ABNORMAL LOW (ref 0.3–0.8)
MONOCYTES RELATIVE PERCENT: 0.5 %
NEUTROPHILS ABSOLUTE COUNT: 2.1 10*9/L (ref 1.8–7.8)
NEUTROPHILS RELATIVE PERCENT: 7.2 %
PLATELET COUNT: 95 10*9/L — ABNORMAL LOW (ref 150–450)
RED BLOOD CELL COUNT: 3.29 10*12/L — ABNORMAL LOW (ref 3.95–5.13)
RED CELL DISTRIBUTION WIDTH: 19.1 % — ABNORMAL HIGH (ref 12.2–15.2)
WBC ADJUSTED: 29.6 10*9/L — ABNORMAL HIGH (ref 3.6–11.2)

## 2021-02-27 LAB — BASIC METABOLIC PANEL
ANION GAP: 9 mmol/L (ref 5–14)
BLOOD UREA NITROGEN: 16 mg/dL (ref 9–23)
BUN / CREAT RATIO: 4
CALCIUM: 9.9 mg/dL (ref 8.7–10.4)
CHLORIDE: 100 mmol/L (ref 98–107)
CO2: 27 mmol/L (ref 20.0–31.0)
CREATININE: 4.43 mg/dL — ABNORMAL HIGH
EGFR CKD-EPI AA FEMALE: 11 mL/min/{1.73_m2} — ABNORMAL LOW (ref >=60)
EGFR CKD-EPI NON-AA FEMALE: 9 mL/min/{1.73_m2} — ABNORMAL LOW (ref >=60)
GLUCOSE RANDOM: 120 mg/dL (ref 70–179)
POTASSIUM: 4 mmol/L (ref 3.4–4.8)
SODIUM: 136 mmol/L (ref 135–145)

## 2021-02-27 LAB — MAGNESIUM: MAGNESIUM: 1.9 mg/dL (ref 1.6–2.6)

## 2021-02-27 LAB — HEPATITIS B CORE ANTIBODY, TOTAL: HEPATITIS B CORE TOTAL ANTIBODY: NONREACTIVE

## 2021-02-27 LAB — PHOSPHORUS: PHOSPHORUS: 4.3 mg/dL (ref 2.4–5.1)

## 2021-02-27 LAB — SLIDE REVIEW

## 2021-02-27 MED ADMIN — atorvastatin (LIPITOR) tablet 40 mg: 40 mg | ORAL | @ 14:00:00

## 2021-02-27 MED ADMIN — carvediloL (COREG) tablet 25 mg: 25 mg | ORAL

## 2021-02-27 MED ADMIN — diclofenac sodium (VOLTAREN) 1 % gel 2 g: 2 g | TOPICAL | @ 21:00:00

## 2021-02-27 MED ADMIN — oxyCODONE (ROXICODONE) immediate release tablet 2.5 mg: 2.5 mg | ORAL | @ 03:00:00 | Stop: 2021-02-27

## 2021-02-27 MED ADMIN — losartan (COZAAR) tablet 100 mg: 100 mg | ORAL | @ 14:00:00

## 2021-02-27 MED ADMIN — hydrALAZINE (APRESOLINE) tablet 50 mg: 50 mg | ORAL | @ 03:00:00

## 2021-02-27 MED ADMIN — diclofenac sodium (VOLTAREN) 1 % gel 2 g: 2 g | TOPICAL | @ 16:00:00

## 2021-02-27 MED ADMIN — cefTRIAXone (ROCEPHIN) 2 g in sodium chloride 0.9 % (NS) 100 mL IVPB-connector bag: 2 g | INTRAVENOUS | @ 14:00:00 | Stop: 2021-03-02

## 2021-02-27 MED ADMIN — hydrALAZINE (APRESOLINE) tablet 50 mg: 50 mg | ORAL | @ 10:00:00

## 2021-02-27 MED ADMIN — sertraline (ZOLOFT) tablet 100 mg: 100 mg | ORAL

## 2021-02-27 MED ADMIN — heparin (porcine) 5,000 unit/mL injection 5,000 Units: 5000 [IU] | SUBCUTANEOUS | @ 14:00:00

## 2021-02-27 MED ADMIN — heparin (porcine) 5,000 unit/mL injection 5,000 Units: 5000 [IU] | SUBCUTANEOUS

## 2021-02-27 MED ADMIN — oxyCODONE (ROXICODONE) immediate release tablet 2.5 mg: 2.5 mg | ORAL | @ 22:00:00 | Stop: 2021-02-27

## 2021-02-27 MED ADMIN — NIFEdipine (PROCARDIA XL) 24 hr tablet 90 mg: 90 mg | ORAL | @ 14:00:00

## 2021-02-27 MED ADMIN — hydrALAZINE (APRESOLINE) tablet 50 mg: 50 mg | ORAL | @ 21:00:00

## 2021-02-27 MED ADMIN — acetaminophen (TYLENOL) tablet 1,000 mg: 1000 mg | ORAL | @ 10:00:00

## 2021-02-27 MED ADMIN — carvediloL (COREG) tablet 25 mg: 25 mg | ORAL | @ 14:00:00

## 2021-02-27 MED ADMIN — oxyCODONE (ROXICODONE) immediate release tablet 2.5 mg: 2.5 mg | ORAL | @ 14:00:00 | Stop: 2021-02-27

## 2021-02-27 MED ADMIN — diclofenac sodium (VOLTAREN) 1 % gel 2 g: 2 g | TOPICAL

## 2021-02-27 MED ADMIN — melatonin tablet 3 mg: 3 mg | ORAL | @ 21:00:00

## 2021-02-27 MED ADMIN — lidocaine (LIDODERM) 5 % patch 2 patch: 2 | TRANSDERMAL | @ 03:00:00

## 2021-02-27 MED ADMIN — acetaminophen (TYLENOL) tablet 1,000 mg: 1000 mg | ORAL | @ 03:00:00

## 2021-02-27 MED ADMIN — hydrALAZINE (APRESOLINE) tablet 50 mg: 50 mg | ORAL | @ 16:00:00

## 2021-02-27 MED ADMIN — diclofenac sodium (VOLTAREN) 1 % gel 2 g: 2 g | TOPICAL | @ 10:00:00

## 2021-02-27 MED ADMIN — acetaminophen (TYLENOL) tablet 1,000 mg: 1000 mg | ORAL | @ 19:00:00

## 2021-02-27 MED ADMIN — furosemide (LASIX) tablet 80 mg: 80 mg | ORAL | @ 14:00:00

## 2021-02-27 NOTE — Unmapped (Signed)
""  VENOUS ACCESS TEAM PROCEDURE    Order was placed for a \""PIV by Venous Access Team (VAT)\"".  Patient was assessed at bedside for placement of a PIV. PPE were donned per protocol.  Access was obtained. Blood return noted.  Dressing intact and device well secured.  Flushed with normal saline.  See LDA for details.  Pt advised to inform RN of any s/s of discomfort at the PIV site.    Workup / Procedure Time:  30 minutes        RN was notified.       Thank you,     Syla Devoss Joy M Liliah Dorian RN Venous Access Team""

## 2021-02-27 NOTE — Unmapped (Signed)
Hematology Resident (MEDE) Progress Note    Assessment & Plan:   Cristina Fields is a 70 y.o. female with CLL, ESRD on iHD, hypertension, prior cerebellar infarct that presented as a direct admission from infusion clinic with disorientation and pain. Found to have Strep Bovis bacteremia.     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 *    Streptococcus bovis bacteremia: BCx 2 of 2 positive. Source possibly seeding from condyloma biopsy. TEE negative for endocarditis.   -CTX 2g daily (4/27-5/11, two week course)  -Outpatient referral to GI for colonoscopy at discharge  -VIR removed HD tunneled line on 5/9  -ICID consulted, appreciate assistance  -Colonoscopy performed at [facility] 12/2018 with one polyp removed and was otherwise normal    CLL - Anemia - Thrombocytopenia - Lymphocytosis:  Oncology history:  Follows with Dr. Lonni Fix. Diagnosed ~2012 and received fludarabine + cyclophosphamide + rituximab x6. Relapsed in 05/2014 treated with bendamustine + rituximab. Relapsed again in 2018 and started ibrutinib in 5/201. Rapid relapse in 04/2020 and was treated with rituximab and chlorambucil through November, 2021. On zanubrutinib prior to admission.   - Resumed zanubrutinib   - BMBx performed 5/6 this admission, follow up results.   -Transfuse for hgb <7, platelets <10,000    Encephalopathy: Now improved and near baseline. Worsening likely multifactorial in the setting of bacteremia per above, toxic-metabolic/uremia, and medication/pain driven superimposed on cognitive impairment at baseline (SLUMS 15 as outpatient).   -Treat infection  -Delirium precautions    Left AC OA - Neck Pain: CT scan showed only non-specific inflammatory stranding in the fat surrounding the left shoulder and axilla.   -No further imaging indicated.   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 Seen by lower GI surgery this admission with no surgical needs at this time.  -WOCN  -Outpatient pap smear to assess for cervical HPV/malignancy  -Pain control    ESRD on iHD - HTN: TThSat Dialysis. Continues to remain hypertensive despite up-titration of home meds while admitted.  -iHD while inpatient  -Coreg 25 mg BID; Nifedipine XL 90 mg daily; Hydralazine 50mg  q6h; Losartan 100mg  daily  -Lasix 80 mg daily    Daily Checklist:  Diet: Regular Diet  DVT PPx: Heparin TID  Electrolytes: Replete Potassium to >/= 3.6 and Magnesium to >/= 1.8  Code Status: Full Code  Dispo: Admit to 4 ONC, dispo pending SNF placement    Team Contact Information:   Primary Team: Hematology Resident (MEDE)  Primary Resident: Coralee Pesa, MD  Resident's Pager: [phone] (Hematology Intern - White)    Interval History:   No complaints this morning. Sleeping comfortably. Working towards SNF discharge. HD tunneled line removed by VIR this AM.     ROS otherwise negative.     Objective:   Temp:  [36.3 ??C-36.9 ??C] 36.9 ??C  Heart Rate:  [67-79] 67  Resp:  [16-20] 16  BP: (144-168)/(52-71) 154/52  SpO2:  [94 %-99 %] 94 %    General: Lying in bed in NAD, VIR removing tunneled line at bedside during interview.   Pulm:  Normal work of breathing on RA.  CV: Regular rate.   Neuro: Alert, no focal deficits.  Psych: Mood and affect appropriate.     Labs/Studies: Labs and Studies from the last 24hrs per EMR and Reviewed

## 2021-02-27 NOTE — Unmapped (Signed)
Pt alert and orientedx3 previous night alert and oriented x4 today, PRN oxy given twice for pain. Meals encouraged and ordered. Falls precautions maintained and no falls during shift. Loss IV access through the shift. Q2 turns maintained. Falls precautions maintained. No acute events.  Problem: Adult Inpatient Plan of Care  Problem: Adult Inpatient Plan of Care  Goal: Absence of Hospital-Acquired Illness or Injury  Intervention: Identify and Manage Fall Risk  Recent Flowsheet Documentation  Taken 02/26/2021 1940 by Phineas Douglas, RN  Safety Interventions:  ??? bed alarm  ??? fall reduction program maintained  ??? family at bedside  ??? low bed  ??? lighting adjusted for tasks/safety  ??? nonskid shoes/slippers when out of bed  Intervention: Prevent Skin Injury  Recent Flowsheet Documentation  Taken 02/26/2021 1940 by Phineas Douglas, RN  Skin Protection:  ??? adhesive use limited  ??? cleansing with dimethicone incontinence wipes  Intervention: Prevent and Manage VTE (Venous Thromboembolism) Risk  Recent Flowsheet Documentation  Taken 02/26/2021 1940 by Phineas Douglas, RN  Activity Management:  ??? activity adjusted per tolerance  ??? activity encouraged     Problem: Fall Injury Risk  Goal: Absence of Fall and Fall-Related Injury  Intervention: Promote Injury-Free Environment  Recent Flowsheet Documentation  Taken 02/26/2021 1940 by Phineas Douglas, RN  Safety Interventions:  ??? bed alarm  ??? fall reduction program maintained  ??? family at bedside  ??? low bed  ??? lighting adjusted for tasks/safety  ??? nonskid shoes/slippers when out of bed     Problem: Impaired Wound Healing  Goal: Optimal Wound Healing  Intervention: Promote Wound Healing  Recent Flowsheet Documentation  Taken 02/26/2021 1940 by Phineas Douglas, RN  Activity Management:  ??? activity adjusted per tolerance  ??? activity encouraged     Problem: Skin Injury Risk Increased  Goal: Skin Health and Integrity  Intervention: Optimize Skin Protection  Recent Flowsheet Documentation  Taken 02/26/2021 1940 by Phineas Douglas, RN  Pressure Reduction Techniques:  ??? frequent weight shift encouraged  ??? weight shift assistance provided  Head of Bed (HOB) Positioning: HOB elevated  Pressure Reduction Devices:  ??? pressure-redistributing mattress utilized  ??? heel offloading device utilized  Skin Protection:  ??? adhesive use limited  ??? cleansing with dimethicone incontinence wipes

## 2021-02-27 NOTE — Unmapped (Signed)
VIR STATUS POST TUNNELED CATHETER REMOVAL    Date/Time:Feb 27, 2021 12:24 PM    Attending:  Claretta Fraise, MD    Assistant(s): Katie Riffle    Diagnosis:  Dialysis    Procedure: Right Tunneled CVC removal.     Time out: Prior to the procedure, a time out was performed with all team members present.      Anesthesia:  Local (Lidocaine)    Complications: None    Estimated Blood Loss:  Minimal     Specimens:  None    Major Findings: Successful removal of right-sided tunneled catheter.    The patient tolerated the procedure well without incident.

## 2021-02-28 DIAGNOSIS — G9340 Encephalopathy, unspecified: Secondary | ICD-10-CM | POA: Diagnosis not present

## 2021-02-28 DIAGNOSIS — A491 Streptococcal infection, unspecified site: Secondary | ICD-10-CM | POA: Diagnosis not present

## 2021-02-28 DIAGNOSIS — D696 Thrombocytopenia, unspecified: Secondary | ICD-10-CM | POA: Diagnosis not present

## 2021-02-28 DIAGNOSIS — C9112 Chronic lymphocytic leukemia of B-cell type in relapse: Secondary | ICD-10-CM | POA: Diagnosis not present

## 2021-02-28 LAB — CBC W/ AUTO DIFF
BASOPHILS ABSOLUTE COUNT: 0.3 10*9/L — ABNORMAL HIGH (ref 0.0–0.1)
BASOPHILS RELATIVE PERCENT: 1.1 %
EOSINOPHILS ABSOLUTE COUNT: 0.5 10*9/L (ref 0.0–0.5)
EOSINOPHILS RELATIVE PERCENT: 2.3 %
HEMATOCRIT: 26 % — ABNORMAL LOW (ref 34.0–44.0)
HEMOGLOBIN: 8.4 g/dL — ABNORMAL LOW (ref 11.3–14.9)
LYMPHOCYTES ABSOLUTE COUNT: 20.1 10*9/L — ABNORMAL HIGH (ref 1.1–3.6)
LYMPHOCYTES RELATIVE PERCENT: 85.6 %
MEAN CORPUSCULAR HEMOGLOBIN CONC: 32.3 g/dL (ref 32.0–36.0)
MEAN CORPUSCULAR HEMOGLOBIN: 27.5 pg (ref 25.9–32.4)
MEAN CORPUSCULAR VOLUME: 85.2 fL (ref 77.6–95.7)
MEAN PLATELET VOLUME: 10 fL (ref 6.8–10.7)
MONOCYTES ABSOLUTE COUNT: 0.5 10*9/L (ref 0.3–0.8)
MONOCYTES RELATIVE PERCENT: 2.1 %
NEUTROPHILS ABSOLUTE COUNT: 2.1 10*9/L (ref 1.8–7.8)
NEUTROPHILS RELATIVE PERCENT: 8.9 %
PLATELET COUNT: 94 10*9/L — ABNORMAL LOW (ref 150–450)
RED BLOOD CELL COUNT: 3.05 10*12/L — ABNORMAL LOW (ref 3.95–5.13)
RED CELL DISTRIBUTION WIDTH: 19 % — ABNORMAL HIGH (ref 12.2–15.2)
WBC ADJUSTED: 23.5 10*9/L — ABNORMAL HIGH (ref 3.6–11.2)

## 2021-02-28 LAB — MAGNESIUM: MAGNESIUM: 1.9 mg/dL (ref 1.6–2.6)

## 2021-02-28 LAB — BASIC METABOLIC PANEL
ANION GAP: 12 mmol/L (ref 5–14)
BLOOD UREA NITROGEN: 22 mg/dL (ref 9–23)
BUN / CREAT RATIO: 4
CALCIUM: 9 mg/dL (ref 8.7–10.4)
CHLORIDE: 98 mmol/L (ref 98–107)
CO2: 24 mmol/L (ref 20.0–31.0)
CREATININE: 5.36 mg/dL — ABNORMAL HIGH
EGFR CKD-EPI AA FEMALE: 9 mL/min/{1.73_m2} — ABNORMAL LOW (ref >=60)
EGFR CKD-EPI NON-AA FEMALE: 8 mL/min/{1.73_m2} — ABNORMAL LOW (ref >=60)
GLUCOSE RANDOM: 109 mg/dL (ref 70–179)
SODIUM: 134 mmol/L — ABNORMAL LOW (ref 135–145)

## 2021-02-28 LAB — SLIDE REVIEW

## 2021-02-28 LAB — POTASSIUM: POTASSIUM: 3.5 mmol/L (ref 3.4–4.8)

## 2021-02-28 LAB — PHOSPHORUS: PHOSPHORUS: 5.3 mg/dL — ABNORMAL HIGH (ref 2.4–5.1)

## 2021-02-28 MED ORDER — DICLOFENAC 1 % TOPICAL GEL
Freq: Four times a day (QID) | TOPICAL | 0 refills | 13 days
Start: 2021-02-28 — End: 2022-02-28

## 2021-02-28 MED ORDER — LIDOCAINE 5 % TOPICAL PATCH
MEDICATED_PATCH | Freq: Every day | TRANSDERMAL | 0 refills | 15 days
Start: 2021-02-28 — End: 2021-03-30

## 2021-02-28 MED ADMIN — oxyCODONE (ROXICODONE) immediate release tablet 2.5 mg: 2.5 mg | ORAL | @ 12:00:00 | Stop: 2021-03-06

## 2021-02-28 MED ADMIN — diclofenac sodium (VOLTAREN) 1 % gel 2 g: 2 g | TOPICAL

## 2021-02-28 MED ADMIN — atorvastatin (LIPITOR) tablet 40 mg: 40 mg | ORAL | @ 12:00:00

## 2021-02-28 MED ADMIN — carvediloL (COREG) tablet 25 mg: 25 mg | ORAL

## 2021-02-28 MED ADMIN — acetaminophen (TYLENOL) tablet 1,000 mg: 1000 mg | ORAL | @ 22:00:00

## 2021-02-28 MED ADMIN — epoetin alfa-EPBx (RETACRIT) injection 4,000 Units: 4000 [IU] | INTRAVENOUS | @ 20:00:00

## 2021-02-28 MED ADMIN — furosemide (LASIX) tablet 80 mg: 80 mg | ORAL | @ 12:00:00

## 2021-02-28 MED ADMIN — heparin (porcine) 1000 unit/mL injection 3,000 Units: 3000 [IU] | INTRAVENOUS | @ 17:00:00

## 2021-02-28 MED ADMIN — carvediloL (COREG) tablet 25 mg: 25 mg | ORAL | @ 12:00:00

## 2021-02-28 MED ADMIN — NIFEdipine (PROCARDIA XL) 24 hr tablet 90 mg: 90 mg | ORAL | @ 12:00:00

## 2021-02-28 MED ADMIN — hydrALAZINE (APRESOLINE) tablet 50 mg: 50 mg | ORAL | @ 04:00:00

## 2021-02-28 MED ADMIN — lidocaine (LIDODERM) 5 % patch 2 patch: 2 | TRANSDERMAL | @ 04:00:00

## 2021-02-28 MED ADMIN — hydrALAZINE (APRESOLINE) tablet 50 mg: 50 mg | ORAL | @ 22:00:00

## 2021-02-28 MED ADMIN — oxyCODONE (ROXICODONE) immediate release tablet 2.5 mg: 2.5 mg | ORAL | @ 22:00:00 | Stop: 2021-03-06

## 2021-02-28 MED ADMIN — melatonin tablet 3 mg: 3 mg | ORAL | @ 22:00:00

## 2021-02-28 MED ADMIN — paricalcitoL (ZEMPLAR) injection 5 mcg: 5 ug | INTRAVENOUS | @ 20:00:00

## 2021-02-28 MED ADMIN — acetaminophen (TYLENOL) tablet 1,000 mg: 1000 mg | ORAL | @ 02:00:00

## 2021-02-28 MED ADMIN — sertraline (ZOLOFT) tablet 100 mg: 100 mg | ORAL

## 2021-02-28 MED ADMIN — heparin (porcine) 5,000 unit/mL injection 5,000 Units: 5000 [IU] | SUBCUTANEOUS | @ 12:00:00

## 2021-02-28 MED ADMIN — acetaminophen (TYLENOL) tablet 1,000 mg: 1000 mg | ORAL | @ 10:00:00

## 2021-02-28 MED ADMIN — heparin (porcine) 5,000 unit/mL injection 5,000 Units: 5000 [IU] | SUBCUTANEOUS

## 2021-02-28 MED ADMIN — diclofenac sodium (VOLTAREN) 1 % gel 2 g: 2 g | TOPICAL | @ 11:00:00

## 2021-02-28 MED ADMIN — diclofenac sodium (VOLTAREN) 1 % gel 2 g: 2 g | TOPICAL | @ 22:00:00

## 2021-02-28 MED ADMIN — hydrALAZINE (APRESOLINE) tablet 50 mg: 50 mg | ORAL | @ 10:00:00

## 2021-02-28 MED ADMIN — zanubrutinib (BRUKINSA) capsule 160 mg: 160 mg | ORAL | @ 22:00:00

## 2021-02-28 MED ADMIN — losartan (COZAAR) tablet 100 mg: 100 mg | ORAL | @ 12:00:00

## 2021-02-28 NOTE — Unmapped (Signed)
Hemodialysis treatment for 3.5 hours; uf goal 1 kg per Dr Lorri Frederick T.; monitor labs, weight, vital signs; monitor signs and symptoms  AVG infection.

## 2021-02-28 NOTE — Unmapped (Signed)
Hematology Resident (MEDE) Progress Note    Assessment & Plan:   Cristina Fields is a 70 y.o. female with CLL, ESRD on iHD, hypertension, prior cerebellar infarct that presented as a direct admission from infusion clinic with disorientation and pain. Found to have Strep Bovis bacteremia.     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 *    Streptococcus bovis bacteremia: BCx 2 of 2 positive. Source possibly seeding from condyloma biopsy. TEE negative for endocarditis.   -CTX 2g daily (4/27-5/11, two week course)  -Outpatient referral to GI for colonoscopy at discharge  -VIR removed HD tunneled line on 5/9  -ICID consulted, appreciate assistance  -Colonoscopy performed at [facility] 12/2018 with one polyp removed and was otherwise normal    CLL - Anemia - Thrombocytopenia - Lymphocytosis:  Oncology history:  Follows with Dr. Lonni Fix. Diagnosed ~2012 and received fludarabine + cyclophosphamide + rituximab x6. Relapsed in 05/2014 treated with bendamustine + rituximab. Relapsed again in 2018 and started ibrutinib in 5/201. Rapid relapse in 04/2020 and was treated with rituximab and chlorambucil through November, 2021. On zanubrutinib prior to admission.   - Resumed zanubrutinib   - BMBx performed 5/6 this admission, follow up results.   -Transfuse for hgb <7, platelets <10,000    Encephalopathy: Now improved and near baseline. Worsening likely multifactorial in the setting of bacteremia per above, toxic-metabolic/uremia, and medication/pain driven superimposed on cognitive impairment at baseline (SLUMS 15 as outpatient).   -Treat infection  -Delirium precautions    Left AC OA - Neck Pain: CT scan showed only non-specific inflammatory stranding in the fat surrounding the left shoulder and axilla.   -No further imaging indicated.   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 Seen by lower GI surgery this admission with no surgical needs at this time.  -WOCN  -Outpatient pap smear to assess for cervical HPV/malignancy  -Pain control      ESRD on iHD - HTN: TThSat Dialysis. Continues to remain hypertensive despite up-titration of home meds while admitted.  -iHD while inpatient  -Coreg 25 mg BID; Nifedipine XL 90 mg daily; Hydralazine 50mg  q6h; Losartan 100mg  daily  -Lasix 80 mg daily    Daily Checklist:  Diet: Regular Diet  DVT PPx: Heparin TID  Electrolytes: Replete Potassium to >/= 3.6 and Magnesium to >/= 1.8  Code Status: Full Code  Dispo: SNF on 5/11 after completion of IV antibiotics    Team Contact Information:   Primary Team: Hematology Resident (MEDE)  Primary Resident: Pearline Cables, MD  Resident's Pager: [phone] (Hematology Intern - White)    Interval History:   Patient reports ongoing neck pain. Otherwise no other complaints.     Continuing with IV antibiotics with last day being 5/11, after which she will transfer to SNF.    ROS otherwise negative.     Objective:   Temp:  [36.3 ??C-36.8 ??C] 36.3 ??C  Heart Rate:  [71-82] 74  Resp:  [16-18] 18  BP: (136-178)/(50-93) 145/69  SpO2:  [93 %-100 %] 100 %      General: Lying in  bed in NAD  Pulm: Normal work of breathing on RA.  CV: Regular rate.   Neuro: Alert, no focal deficits.  Psych: Mood and affect appropriate.     Labs/Studies: Labs and Studies from the last 24hrs per EMR and Reviewed

## 2021-02-28 NOTE — Unmapped (Signed)
[facility] Refill Coordination Note    Specialty Medication(s) to be Shipped:   Hematology/Oncology: Brukinsa    Other medication(s) to be shipped: No additional medications requested for fill at this time     Cristina Fields, DOB: 07-Jul-1951  Phone: [phone] (home)       All above HIPAA information was verified with patient's family member, spouse.     Was a Nurse, learning disability used for this call? No    Completed refill call assessment today to schedule patient's medication shipment from the [facility] [phone]).  All relevant notes have been reviewed.     Specialty medication(s) and dose(s) confirmed: Regimen is correct and unchanged.   Changes to medications: [place] reports no changes at this time.  Changes to insurance: No  New side effects reported not previously addressed with a pharmacist or physician: None reported  Questions for the pharmacist: No    Confirmed patient received a Conservation officer, historic buildings and a Surveyor, mining with first shipment. The patient will receive a drug information handout for each medication shipped and additional FDA Medication Guides as required.       DISEASE/MEDICATION-SPECIFIC INFORMATION        N/A    SPECIALTY MEDICATION ADHERENCE     Medication Adherence    Patient reported X missed doses in the last month: 3-4  Specialty Medication: Brukinsa 80 mg caps - takes 2 caps 3 x weekly on dialysis days (after dialysis)  Patient is on additional specialty medications: No  Informant: spouse  Reasons for non-adherence: instructed by provider to hold or take differently  Confirmed plan for next specialty medication refill: delivery by pharmacy          Refill Coordination    Has the Patients' Contact Information Changed: No  Is the Shipping Address Different: No         Were doses missed due to medication being on hold? Yes - meds on hold while inpatient per husband    Brukinsa 80 mg: unsure how many days of medicine on hand - they are the hospital with her     REFERRAL TO PHARMACIST     Referral to the pharmacist: Not needed      [facility]     Shipping address confirmed in Epic.     Delivery Scheduled: Yes, Expected medication delivery date: 03/03/21.     Medication will be delivered via UPS to the prescription address in Epic WAM.    Malorie Bigford A [facility] Specialty Pharmacist

## 2021-02-28 NOTE — Unmapped (Signed)
[facility] Nephrology Hemodialysis Procedure Note     02/28/2021    Cristina Fields was seen and examined on hemodialysis    CHIEF COMPLAINT: End Stage Renal Disease     INTERVAL HISTORY: Feeling cold. Otherwise doing ok.       DIALYSIS TREATMENT DATA:  Estimated Dry Weight (kg): 48.5 kg (106 lb 14.8 oz)  Patient Goal Weight (kg): 0 kg (0 lb)  Dialyzer: F-180 (98 mLs)  Dialysis Bath  Bath: 3 K+ / 2.5 Ca+  Dialysate Na (mEq/L): 137 mEq/L  Dialysate HCO3 (mEq/L): 35 mEq/L  Dialysate Total Buffer HCO3 (mEq/L): 35 mEq/L  Blood Flow Rate (mL/min): 400 mL/min  Dialysis Flow (mL/min): 800 mL/min    PHYSICAL EXAM:  Vitals:  Temp:  [36.3 ??C (97.4 ??F)-36.8 ??C (98.2 ??F)] 36.3 ??C (97.4 ??F)  Heart Rate:  [71-89] 89  BP: (136-178)/(50-93) 141/83  MAP (mmHg):  [79-97] 96  Weights:  Pre-Treatment Weight (kg): 48.1 kg (106 lb 0.7 oz)    General: Appearing in no acute distress  Pulmonary: clear to auscultation  Cardiovascular: regular rate and rhythm  Extremities: no significant  edema  Access: LUE AV graft   Dialyzing in chair    LAB DATA:  Lab Results   Component Value Date    NA 134 (L) 02/28/2021    K 3.5 02/28/2021    CL 98 02/28/2021    CO2 24.0 02/28/2021    BUN 22 02/28/2021    CREATININE 5.36 (H) 02/28/2021      Lab Results   Component Value Date    HCT 26.0 (L) 02/28/2021    WBC 23.5 (H) 02/28/2021        ASSESSMENT/PLAN:  End Stage Renal Disease on Intermittent Hemodialysis:  - UF goal: 1L as tolerated  - Adjust medications for a GFR <10  - Avoid nephrotoxic agents     Bone Mineral Metabolism:  Lab Results   Component Value Date    CALCIUM 9.0 02/28/2021    CALCIUM 9.9 02/27/2021    Lab Results   Component Value Date    ALBUMIN 2.6 (L) 02/26/2021    ALBUMIN 2.9 (L) 02/23/2021      Lab Results   Component Value Date    PHOS 5.3 (H) 02/28/2021    PHOS 4.3 02/27/2021    No results found for: PTH   - Labs appropriate, no changes.    Anemia:   Lab Results   Component Value Date    HGB 8.4 (L) 02/28/2021    HGB 9.1 (L) 02/27/2021 HGB 9.0 (L) 02/26/2021    Iron Saturation (%)   Date Value Ref Range Status   02/13/2021 21 % Final      Lab Results   Component Value Date    FERRITIN 2,051.1 (H) 02/13/2021       Increase to intravenous Epogen/Retacrit 4k units with each treatment.       Tonye Royalty, MD  [place] Division of Nephrology & Hypertension

## 2021-02-28 NOTE — Unmapped (Signed)
Pt alert and oriented. VSS, no falls and afebrile. No acute events overnight. Pt c/o neck pain, received scheduled voltaren and lidocaine patches. Pt had BM x1, soft; sacral wound cleansed and new dressing applied. Q2 turns maintained. Safety measures remain in place with bed low, brakes locked, side rails up, call bell within reach, non-skid footwear when out of bed. Will continue to monitor.

## 2021-02-28 NOTE — Unmapped (Signed)
Pt alert and oriented x4. Pt has been afebrile with stable VS. Pt with c/o pain. PRN Oxy given per orders. Pt sat up in chair for most of the morning. Dressing change done this evening. Pt is awaiting SNF placement. Significant other visiting this evening. Bed alarm on. Falls precautions and safety maintained. Will continue to monitor.         Problem: Adult Inpatient Plan of Care  Goal: Plan of Care Review  Outcome: Ongoing - Unchanged  Goal: Patient-Specific Goal (Individualized)  Outcome: Ongoing - Unchanged  Goal: Absence of Hospital-Acquired Illness or Injury  Outcome: Ongoing - Unchanged  Intervention: Identify and Manage Fall Risk  Recent Flowsheet Documentation  Taken 02/27/2021 1015 by Macario Carls, RN  Safety Interventions:   bed alarm   fall reduction program maintained   lighting adjusted for tasks/safety   low bed   nonskid shoes/slippers when out of bed  Intervention: Prevent Skin Injury  Recent Flowsheet Documentation  Taken 02/27/2021 1015 by Macario Carls, RN  Skin Protection: adhesive use limited  Taken 02/27/2021 1000 by Macario Carls, RN  Skin Protection: adhesive use limited  Intervention: Prevent and Manage VTE (Venous Thromboembolism) Risk  Recent Flowsheet Documentation  Taken 02/27/2021 1015 by Macario Carls, RN  Activity Management:   activity adjusted per tolerance   activity encouraged  Goal: Optimal Comfort and Wellbeing  Outcome: Ongoing - Unchanged  Goal: Readiness for Transition of Care  Outcome: Ongoing - Unchanged  Goal: Rounds/Family Conference  Outcome: Ongoing - Unchanged

## 2021-03-01 DIAGNOSIS — D696 Thrombocytopenia, unspecified: Secondary | ICD-10-CM | POA: Diagnosis not present

## 2021-03-01 DIAGNOSIS — R7881 Bacteremia: Secondary | ICD-10-CM | POA: Diagnosis not present

## 2021-03-01 DIAGNOSIS — A491 Streptococcal infection, unspecified site: Secondary | ICD-10-CM | POA: Diagnosis not present

## 2021-03-01 DIAGNOSIS — C9112 Chronic lymphocytic leukemia of B-cell type in relapse: Secondary | ICD-10-CM | POA: Diagnosis not present

## 2021-03-01 LAB — HEPATIC FUNCTION PANEL
ALBUMIN: 2.7 g/dL — ABNORMAL LOW (ref 3.4–5.0)
ALKALINE PHOSPHATASE: 131 U/L — ABNORMAL HIGH (ref 46–116)
ALT (SGPT): 22 U/L (ref 10–49)
AST (SGOT): 32 U/L (ref <=34)
BILIRUBIN DIRECT: 0.1 mg/dL (ref 0.00–0.30)
BILIRUBIN TOTAL: 0.2 mg/dL — ABNORMAL LOW (ref 0.3–1.2)
PROTEIN TOTAL: 4.9 g/dL — ABNORMAL LOW (ref 5.7–8.2)

## 2021-03-01 LAB — CBC W/ AUTO DIFF
BASOPHILS ABSOLUTE COUNT: 0.2 10*9/L — ABNORMAL HIGH (ref 0.0–0.1)
BASOPHILS RELATIVE PERCENT: 0.8 %
EOSINOPHILS ABSOLUTE COUNT: 0.4 10*9/L (ref 0.0–0.5)
EOSINOPHILS RELATIVE PERCENT: 1.4 %
HEMATOCRIT: 24.8 % — ABNORMAL LOW (ref 34.0–44.0)
HEMOGLOBIN: 8.2 g/dL — ABNORMAL LOW (ref 11.3–14.9)
LYMPHOCYTES ABSOLUTE COUNT: 22.8 10*9/L — ABNORMAL HIGH (ref 1.1–3.6)
LYMPHOCYTES RELATIVE PERCENT: 88.7 %
MEAN CORPUSCULAR HEMOGLOBIN CONC: 33 g/dL (ref 32.0–36.0)
MEAN CORPUSCULAR HEMOGLOBIN: 28.4 pg (ref 25.9–32.4)
MEAN CORPUSCULAR VOLUME: 86 fL (ref 77.6–95.7)
MEAN PLATELET VOLUME: 10.7 fL (ref 6.8–10.7)
MONOCYTES ABSOLUTE COUNT: 0.1 10*9/L — ABNORMAL LOW (ref 0.3–0.8)
MONOCYTES RELATIVE PERCENT: 0.5 %
NEUTROPHILS ABSOLUTE COUNT: 2.2 10*9/L (ref 1.8–7.8)
NEUTROPHILS RELATIVE PERCENT: 8.6 %
PLATELET COUNT: 76 10*9/L — ABNORMAL LOW (ref 150–450)
RED BLOOD CELL COUNT: 2.88 10*12/L — ABNORMAL LOW (ref 3.95–5.13)
RED CELL DISTRIBUTION WIDTH: 20.1 % — ABNORMAL HIGH (ref 12.2–15.2)
WBC ADJUSTED: 25.7 10*9/L — ABNORMAL HIGH (ref 3.6–11.2)

## 2021-03-01 LAB — PHOSPHORUS: PHOSPHORUS: 3.5 mg/dL (ref 2.4–5.1)

## 2021-03-01 LAB — BASIC METABOLIC PANEL
ANION GAP: 8 mmol/L (ref 5–14)
BLOOD UREA NITROGEN: 12 mg/dL (ref 9–23)
BUN / CREAT RATIO: 4
CALCIUM: 8.7 mg/dL (ref 8.7–10.4)
CHLORIDE: 98 mmol/L (ref 98–107)
CO2: 29 mmol/L (ref 20.0–31.0)
CREATININE: 2.92 mg/dL — ABNORMAL HIGH
EGFR CKD-EPI AA FEMALE: 18 mL/min/{1.73_m2} — ABNORMAL LOW (ref >=60)
EGFR CKD-EPI NON-AA FEMALE: 16 mL/min/{1.73_m2} — ABNORMAL LOW (ref >=60)
GLUCOSE RANDOM: 107 mg/dL (ref 70–179)
POTASSIUM: 3.7 mmol/L (ref 3.4–4.8)
SODIUM: 135 mmol/L (ref 135–145)

## 2021-03-01 LAB — MAGNESIUM: MAGNESIUM: 1.7 mg/dL (ref 1.6–2.6)

## 2021-03-01 LAB — POTASSIUM: POTASSIUM: 3.7 mmol/L (ref 3.4–4.8)

## 2021-03-01 MED ORDER — NIFEDIPINE ER 90 MG TABLET,EXTENDED RELEASE 24 HR
ORAL_TABLET | Freq: Every day | ORAL | 3 refills | 90 days
Start: 2021-03-01 — End: 2022-03-01

## 2021-03-01 MED ORDER — FUROSEMIDE 80 MG TABLET
ORAL_TABLET | Freq: Every day | ORAL | 0 refills | 30 days
Start: 2021-03-01 — End: 2021-03-31

## 2021-03-01 MED ORDER — MELATONIN 3 MG TABLET
Freq: Every evening | ORAL | 0 refills | 0 days
Start: 2021-03-01 — End: ?

## 2021-03-01 MED ADMIN — heparin (porcine) 5,000 unit/mL injection 5,000 Units: 5000 [IU] | SUBCUTANEOUS | @ 13:00:00

## 2021-03-01 MED ADMIN — sertraline (ZOLOFT) tablet 100 mg: 100 mg | ORAL | @ 01:00:00

## 2021-03-01 MED ADMIN — hydrALAZINE (APRESOLINE) tablet 50 mg: 50 mg | ORAL | @ 09:00:00

## 2021-03-01 MED ADMIN — carvediloL (COREG) tablet 25 mg: 25 mg | ORAL | @ 01:00:00

## 2021-03-01 MED ADMIN — hydrALAZINE (APRESOLINE) tablet 50 mg: 50 mg | ORAL | @ 17:00:00

## 2021-03-01 MED ADMIN — acetaminophen (TYLENOL) tablet 1,000 mg: 1000 mg | ORAL | @ 09:00:00

## 2021-03-01 MED ADMIN — diclofenac sodium (VOLTAREN) 1 % gel 2 g: 2 g | TOPICAL | @ 17:00:00

## 2021-03-01 MED ADMIN — melatonin tablet 3 mg: 3 mg | ORAL | @ 22:00:00

## 2021-03-01 MED ADMIN — heparin (porcine) 5,000 unit/mL injection 5,000 Units: 5000 [IU] | SUBCUTANEOUS | @ 01:00:00

## 2021-03-01 MED ADMIN — diclofenac sodium (VOLTAREN) 1 % gel 2 g: 2 g | TOPICAL | @ 20:00:00

## 2021-03-01 MED ADMIN — acetaminophen (TYLENOL) tablet 1,000 mg: 1000 mg | ORAL | @ 17:00:00

## 2021-03-01 MED ADMIN — losartan (COZAAR) tablet 100 mg: 100 mg | ORAL | @ 13:00:00

## 2021-03-01 MED ADMIN — diclofenac sodium (VOLTAREN) 1 % gel 2 g: 2 g | TOPICAL | @ 09:00:00

## 2021-03-01 MED ADMIN — hydrALAZINE (APRESOLINE) tablet 50 mg: 50 mg | ORAL | @ 05:00:00

## 2021-03-01 MED ADMIN — furosemide (LASIX) tablet 80 mg: 80 mg | ORAL | @ 13:00:00

## 2021-03-01 MED ADMIN — NIFEdipine (PROCARDIA XL) 24 hr tablet 90 mg: 90 mg | ORAL | @ 13:00:00

## 2021-03-01 MED ADMIN — hydrALAZINE (APRESOLINE) tablet 50 mg: 50 mg | ORAL | @ 22:00:00

## 2021-03-01 MED ADMIN — cefTRIAXone (ROCEPHIN) 2 g in sodium chloride 0.9 % (NS) 100 mL IVPB-connector bag: 2 g | INTRAVENOUS | @ 01:00:00 | Stop: 2021-03-02

## 2021-03-01 MED ADMIN — diclofenac sodium (VOLTAREN) 1 % gel 2 g: 2 g | TOPICAL | @ 01:00:00

## 2021-03-01 MED ADMIN — atorvastatin (LIPITOR) tablet 40 mg: 40 mg | ORAL | @ 13:00:00

## 2021-03-01 MED ADMIN — lidocaine (LIDODERM) 5 % patch 2 patch: 2 | TRANSDERMAL | @ 03:00:00

## 2021-03-01 MED ADMIN — carvediloL (COREG) tablet 25 mg: 25 mg | ORAL | @ 13:00:00

## 2021-03-01 NOTE — Unmapped (Signed)
Hematology Resident (MEDE) Progress Note    Assessment & Plan:   Cristina Fields is a 70 y.o. female with CLL, ESRD on iHD, hypertension, prior cerebellar infarct that presented as a direct admission from infusion clinic with disorientation and pain. Found to have Strep Bovis bacteremia. She is currently finished with antibiotics and awaiting transfer to SNF.    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 *    Streptococcus bovis bacteremia: BCx 2 of 2 positive. Source possibly seeding from condyloma biopsy. TEE negative for endocarditis.   -ICID consulted, appreciate assistance  -Completed 2 week course CTX 2g daily (4/27-5/11)  -VIR removed HD tunneled line on 5/9  -Colonoscopy performed at [facility] 12/2018 with one polyp removed and was otherwise normal  -Outpatient referral to GI for colonoscopy at discharge    CLL - Anemia - Thrombocytopenia - Lymphocytosis:  Oncology history:  Follows with Dr. Lonni Fix. Diagnosed ~2012 and received fludarabine + cyclophosphamide + rituximab x6. Relapsed in 05/2014 treated with bendamustine + rituximab. Relapsed again in 2018 and started ibrutinib in 5/201. Rapid relapse in 04/2020 and was treated with rituximab and chlorambucil through November, 2021. On zanubrutinib prior to admission.   - Resumed zanubrutinib   - BMBx performed 5/6 this admission, results still in process.   -Transfuse for hgb <7, platelets <10,000    Encephalopathy: Now improved and near baseline. Worsening likely multifactorial in the setting of bacteremia per above, toxic-metabolic/uremia, and medication/pain driven superimposed on cognitive impairment at baseline (SLUMS 15 as outpatient).   -Delirium precautions    Left AC OA - Neck Pain: CT scan showed only non-specific inflammatory stranding in the fat surrounding the left shoulder and axilla.   -No further imaging indicated.   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 Seen by lower GI surgery this admission with no surgical needs at this time.  -WOCN  -Outpatient pap smear to assess for cervical HPV/malignancy  -Pain control      ESRD on iHD - HTN: TThSat Dialysis. Continues to remain hypertensive despite up-titration of home meds while admitted.  -iHD while inpatient  -Coreg 25 mg BID; Nifedipine XL 90 mg daily; Hydralazine 50mg  q6h; Losartan 100mg  daily  -Lasix 80 mg daily    Daily Checklist:  Diet: Regular Diet  DVT PPx: Heparin TID  Electrolytes: Replete Potassium to >/= 3.6 and Magnesium to >/= 1.8  Code Status: Full Code  Dispo: SNF on 5/11 after completion of IV antibiotics    Team Contact Information:   Primary Team: Hematology Resident (MEDE)  Primary Resident: Pearline Cables, MD  Resident's Pager: [phone] (Hematology Intern - White)    Interval History:     No acute events overnight.    Awaiting transfer to SNF, pending SNF confirming VA authorization & arrangement of transportation to dialysis.    ROS otherwise negative.     Objective:   Temp:  [36.3 ??C-37.2 ??C] 36.5 ??C  Heart Rate:  [79-98] 79  Resp:  [16-24] 16  BP: (141-173)/(57-97) 170/61  SpO2:  [93 %-99 %] 99 %  General: Sitting up in chair, NAD  Pulm: Normal work of breathing on RA.  CV: Regular rate.   Neuro: Alert, no focal deficits.  Psych: Mood and affect appropriate.     Labs/Studies: Labs and Studies from the last 24hrs per EMR and Reviewed

## 2021-03-01 NOTE — Unmapped (Signed)
HEMODIALYSIS NURSE PROCEDURE NOTE    Treatment Number:  7 Room/Station:  7 Procedure Date:  02/28/21   Total Treatment Time:  4,586 Min.    CONSENT:  Written consent was obtained prior to the procedure and is detailed in the medical record. Prior to the start of the procedure, a time out was taken and the identity of the patient was confirmed via name, medical record number and date of birth.     WEIGHTS:  Hemodialysis Pre-Treatment Weights     Date/Time Pre-Treatment Weight (kg) Estimated Dry Weight (kg) Patient Goal Weight (kg) Total Goal Weight (kg)    02/28/21 1146 48.1 kg (106 lb 0.7 oz) 48.5 kg (106 lb 14.8 oz) 0 kg (0 lb) 0.55 kg (1 lb 3.4 oz)           Hemodialysis Post Treatment Weights     Date/Time Post-Treatment Weight (kg) Treatment Weight Change (kg)    02/28/21 1613 46.7 kg (102 lb 15.3 oz) -1.4 kg        Active Dialysis Orders (168h ago, onward)     Start     Ordered    02/28/21 1511  Hemodialysis inpatient  Every Tue,Thu,Sat      Comments: RIJ IS NOT A DIALYSIS ACCESS   Question Answer Comment   K+ 3 meq/L    Ca++ 2.5 meq/L    Bicarb 35 meq/L    Na+ 137 meq/L    Na+ Modeling no    Dialyzer F180NR    Dialysate Temperature (C) 37    BFR-As tolerated to a maximum of: 500 mL/min    DFR 800 mL/min    Duration of treatment 3.5 Hr    Dry weight (kg) 47.5kg (new 5/10)    Challenge dry weight (kg) no    Fluid removal (L) 1L 5/10    Tubing Adult = 142 ml    Access Site AVG    Access Site Location Left    Keep SBP >: 95        02/28/21 1510    02/23/21 1500  Hemodialysis inpatient  Every Tue,Thu,Sat,   Status:  Canceled      Comments: RIJ IS NOT A DIALYSIS ACCESS   Question Answer Comment   K+ 3 meq/L    Ca++ 2.5 meq/L    Bicarb 35 meq/L    Na+ 137 meq/L    Na+ Modeling no    Dialyzer F180NR    Dialysate Temperature (C) 37    BFR-As tolerated to a maximum of: 500 mL/min    DFR 800 mL/min    Duration of treatment 3.5 Hr    Dry weight (kg) 48.5 kg    Challenge dry weight (kg) no    Fluid removal (L) to edw Tubing Adult = 142 ml    Access Site AVG    Access Site Location Left    Keep SBP >: 95        02/23/21 1459              ACCESS SITE:             Arteriovenous Fistula - Vein Graft  Access Arteriovenous vein graft Left;Upper Arm (Active)   Site Assessment Clean;Dry;Intact 02/28/21 1146   AV Fistula Thrill Present;Bruit Present 02/28/21 1146   Status Accessed 02/28/21 1146   Dressing Intervention New dressing 02/28/21 1146   Dressing Status      Clean;Dry;Intact/not removed 02/27/21 1002   Site Condition No complications 02/28/21 1146   Dressing  Gauze 02/28/21 1146   Dressing To Be Removed (Date/Time) 4-6 hours post HD 02/25/21 1230     Catheter Fill Volumes:  Arterial:  NA mL Venous:  NA mL   Catheter filled with NA post procedure.    Patient Lines/Drains/Airways Status     Active Peripheral & Central Intravenous Access     Name Placement date Placement time Site Days    Peripheral IV 02/27/21 Posterior;Right Forearm 02/27/21  0126  Forearm  1              LAB RESULTS:  Lab Results   Component Value Date    NA 134 (L) 02/28/2021    K 3.5 02/28/2021    CL 98 02/28/2021    CO2 24.0 02/28/2021    BUN 22 02/28/2021    CREATININE 5.36 (H) 02/28/2021    GLU 109 02/28/2021    CALCIUM 9.0 02/28/2021    PHOS 5.3 (H) 02/28/2021    MG 1.9 02/28/2021    IRON 35 (L) 02/13/2021    LABIRON 21 02/13/2021    FERRITIN 2,051.1 (H) 02/13/2021    TIBC 163 (L) 02/13/2021     Lab Results   Component Value Date    WBC 23.5 (H) 02/28/2021    HGB 8.4 (L) 02/28/2021    HCT 26.0 (L) 02/28/2021    PLT 94 (L) 02/28/2021    APTT 33.0 02/15/2021        VITAL SIGNS:    Hemodynamics     Date/Time Pulse BP MAP (mmHg) Patient Position    02/28/21 1617 81 173/76 ??? Sitting    02/28/21 1600 80 150/73 ??? Sitting    02/28/21 1530 98 156/97 ??? Sitting    02/28/21 1500 89 141/83 ??? Sitting    02/28/21 1430 80 162/72 ??? Sitting    02/28/21 1400 82 149/72 ??? Sitting        Blood Volume Monitor     Date/Time Blood Volume Change (%) HCT HGB Critline O2 SAT % 02/28/21 1600 -11.2 % 29.6 10.1 98.8    02/28/21 1530 -11.3 % 29.6 10.1 98.5    02/28/21 1510 -11.4 % 29.7 ??? ???    02/28/21 1430 -8.4 % 28.7 9.7 99.2    02/28/21 1400 -7 % 28.3 9.6 98.6        Oxygen Therapy     Date/Time Resp SpO2 O2 Device FiO2 (%) O2 Flow Rate (L/min)    02/28/21 1617 18 ??? None (Room air) -- ???    02/28/21 1600 18 ??? None (Room air) -- ???    02/28/21 1530 18 ??? None (Room air) -- ???    02/28/21 1500 ??? ??? None (Room air) -- ???    02/28/21 1430 18 ??? None (Room air) -- ???    02/28/21 1400 17 ??? None (Room air) -- ???        Oxygen Connected to Wall:  n/a    Pre-Hemodialysis Assessment     Date/Time Therapy Number Dialyzer All Research scientist (physical sciences) Detector Dialysis Flow (mL/min)    02/28/21 1146 7 F-180 (98 mLs) Yes Engaged 800 mL/min    Date/Time Verify Priming Solution Priming Volume Hemodialysis Independent pH Hemodialysis Machine Conductivity (mS/cm) Hemodialysis Independent Conductivity (mS/cm)    02/28/21 1146 0.9% NS 300 mL ???  NA 138 mS/cm 13.7 mS/cm    Date/Time Bicarb Conductivity Residual Bleach Negative Free Chlorine Total Chlorine Chloramine    02/28/21 1146 -- Yes -- 0 --  Pre-Hemodialysis Treatment Comments     Date/Time Pre-Hemodialysis Comments    02/28/21 1146 alert and oriented        Hemodialysis Treatment     Date/Time Blood Flow Rate (mL/min) Arterial Pressure (mmHg) Venous Pressure (mmHg) Transmembrane Pressure (mmHg)    02/28/21 1615 ??? ??? ??? ???    02/28/21 1613 ??? ??? ??? ???    02/28/21 1600 400 mL/min -130 mmHg 210 mmHg 40 mmHg    02/28/21 1530 400 mL/min -130 mmHg 200 mmHg 30 mmHg    02/28/21 1510 ??? ??? ??? ???    02/28/21 1430 400 mL/min -120 mmHg 210 mmHg 40 mmHg    02/28/21 1400 400 mL/min -130 mmHg 200 mmHg 30 mmHg    02/28/21 1300 ??? ??? ??? ???    02/28/21 1234 400 mL/min -120 mmHg 190 mmHg 10 mmHg    Date/Time Ultrafiltration Rate (mL/hr) Ultrafiltrate Removed (mL) Dialysate Flow Rate (mL/min) KECN Linna Caprice)    02/28/21 1615 ??? 1550 mL ??? ???    02/28/21 1613 ??? 1550 mL ??? ???    02/28/21 1600 470 mL/hr 1460 mL 800 ml/min ???    02/28/21 1530 470 mL/hr 1210 mL 800 ml/min ???    02/28/21 1510 ??? 1108 mL ??? ???    02/28/21 1430 470 mL/hr 690 mL 800 ml/min ???    02/28/21 1400 470 mL/hr 570 mL 800 ml/min ???    02/28/21 1300 ??? 115 mL ??? ???    02/28/21 1234 160 mL/hr 0 mL 800 ml/min ???        Hemodialysis Treatment Comments     Date/Time Intra-Hemodialysis Comments    02/28/21 1613 treatment completed blood rinseback    02/28/21 1600 stable    02/28/21 1530 alert, had BM in chair, cleaned patient    02/28/21 1510 Dr Thad Ranger and Dr Jacinto Reap rounding and ordered to keep uf 1 kg, new EDW 47.5kg    02/28/21 1430 asleep, vs stable    02/28/21 1400 asleep, vs stable    02/28/21 1300 Dr Chase Picket ordered to uf 1 kg.    02/28/21 1234 HD treatment initiated        Post Treatment     Date/Time Rinseback Volume (mL) On Line Clearance: spKt/V Total Liters Processed (L/min) Dialyzer Clearance    02/28/21 1613 300 mL 2.43 spKt/V 83.9 L/min Lightly streaked          Post Hemodialysis Treatment Comments     Date/Time Post-Hemodialysis Comments    02/28/21 1613 alert, stable        POST TREATMENT ASSESSMENT:  General appearance:  alert  Neurological:  Grossly normal  Lungs:  clear to auscultation bilaterally  Hearts:  S1 S2 regular    Hemodialysis I/O     Date/Time Total Hemodialysis Replacement Volume (mL) Total Ultrafiltrate Output (mL)    02/28/21 1613 ??? 1000 mL        [id number] - Medicaitons Given During Treatment  (last 4 hrs)         Connye Burkitt, RN       Medication Name Action Time Action Route Rate Dose User     epoetin alfa-EPBx (RETACRIT) injection 4,000 Units 02/28/21 1606 Given Intravenous  4,000 Units Verdis Frederickson Shyloh Derosa, RN     paricalcitoL [facility]) injection 5 mcg 02/28/21 1606 Given Intravenous  5 mcg Verdis Frederickson Adolpho Meenach, RN                  Patient tolerated treatment in a  Dialysis Recliner.

## 2021-03-01 NOTE — Unmapped (Signed)
Cristina Fields was aox4 this shift. Reports pain in her L neck/shoulder, managed with lidocaine patches and creams. Declines other intervention. Rectal wound dressing changed x2 so far this shift after being soiled by BM. Pt turns well in the bed independently, but assisted to boost when sliding down in the bed and pillows positioned for comfort. Bed alarms on at all times, no falls this shift.       Problem: Adult Inpatient Plan of Care  Goal: Plan of Care Review  Outcome: Ongoing - Unchanged  Goal: Patient-Specific Goal (Individualized)  Outcome: Ongoing - Unchanged  Goal: Absence of Hospital-Acquired Illness or Injury  Outcome: Ongoing - Unchanged  Intervention: Identify and Manage Fall Risk  Recent Flowsheet Documentation  Taken 02/28/2021 2000 by Meyer Russel, RN  Safety Interventions:   low bed   fall reduction program maintained   bed alarm  Intervention: Prevent and Manage VTE (Venous Thromboembolism) Risk  Recent Flowsheet Documentation  Taken 02/28/2021 2000 by Meyer Russel, RN  Activity Management: activity adjusted per tolerance  Goal: Optimal Comfort and Wellbeing  Outcome: Ongoing - Unchanged  Goal: Readiness for Transition of Care  Outcome: Ongoing - Unchanged  Goal: Rounds/Family Conference  Outcome: Ongoing - Unchanged     Problem: Self-Care Deficit  Goal: Improved Ability to Complete Activities of Daily Living  Outcome: Ongoing - Unchanged     Problem: Fall Injury Risk  Goal: Absence of Fall and Fall-Related Injury  Outcome: Ongoing - Unchanged  Intervention: Promote Injury-Free Environment  Recent Flowsheet Documentation  Taken 02/28/2021 2000 by Meyer Russel, RN  Safety Interventions:   low bed   fall reduction program maintained   bed alarm     Problem: Impaired Wound Healing  Goal: Optimal Wound Healing  Outcome: Ongoing - Unchanged  Intervention: Promote Wound Healing  Recent Flowsheet Documentation  Taken 02/28/2021 2000 by Meyer Russel, RN  Activity Management: activity adjusted per tolerance Problem: Device-Related Complication Risk (Hemodialysis)  Goal: Safe, Effective Therapy Delivery  Outcome: Ongoing - Unchanged     Problem: Hemodynamic Instability (Hemodialysis)  Goal: Effective Tissue Perfusion  Outcome: Ongoing - Unchanged     Problem: Infection (Hemodialysis)  Goal: Absence of Infection Signs and Symptoms  Outcome: Ongoing - Unchanged     Problem: Skin Injury Risk Increased  Goal: Skin Health and Integrity  Outcome: Ongoing - Unchanged  Intervention: Optimize Skin Protection  Recent Flowsheet Documentation  Taken 02/28/2021 2000 by Meyer Russel, RN  Pressure Reduction Techniques: frequent weight shift encouraged  Pressure Reduction Devices: pressure-redistributing mattress utilized

## 2021-03-02 DIAGNOSIS — C9112 Chronic lymphocytic leukemia of B-cell type in relapse: Secondary | ICD-10-CM | POA: Diagnosis not present

## 2021-03-02 DIAGNOSIS — D696 Thrombocytopenia, unspecified: Secondary | ICD-10-CM | POA: Diagnosis not present

## 2021-03-02 DIAGNOSIS — R7881 Bacteremia: Secondary | ICD-10-CM | POA: Diagnosis not present

## 2021-03-02 DIAGNOSIS — A491 Streptococcal infection, unspecified site: Secondary | ICD-10-CM | POA: Diagnosis not present

## 2021-03-02 LAB — CBC W/ AUTO DIFF
BASOPHILS ABSOLUTE COUNT: 0.1 10*9/L (ref 0.0–0.1)
BASOPHILS RELATIVE PERCENT: 0.7 %
EOSINOPHILS ABSOLUTE COUNT: 0.3 10*9/L (ref 0.0–0.5)
EOSINOPHILS RELATIVE PERCENT: 1.6 %
HEMATOCRIT: 23.9 % — ABNORMAL LOW (ref 34.0–44.0)
HEMOGLOBIN: 7.9 g/dL — ABNORMAL LOW (ref 11.3–14.9)
LYMPHOCYTES ABSOLUTE COUNT: 16.6 10*9/L — ABNORMAL HIGH (ref 1.1–3.6)
LYMPHOCYTES RELATIVE PERCENT: 80.8 %
MEAN CORPUSCULAR HEMOGLOBIN CONC: 33.2 g/dL (ref 32.0–36.0)
MEAN CORPUSCULAR HEMOGLOBIN: 28.4 pg (ref 25.9–32.4)
MEAN CORPUSCULAR VOLUME: 85.4 fL (ref 77.6–95.7)
MEAN PLATELET VOLUME: 9.9 fL (ref 6.8–10.7)
MONOCYTES ABSOLUTE COUNT: 0.6 10*9/L (ref 0.3–0.8)
MONOCYTES RELATIVE PERCENT: 2.7 %
NEUTROPHILS ABSOLUTE COUNT: 2.9 10*9/L (ref 1.8–7.8)
NEUTROPHILS RELATIVE PERCENT: 14.2 %
PLATELET COUNT: 75 10*9/L — ABNORMAL LOW (ref 150–450)
RED BLOOD CELL COUNT: 2.8 10*12/L — ABNORMAL LOW (ref 3.95–5.13)
RED CELL DISTRIBUTION WIDTH: 19.9 % — ABNORMAL HIGH (ref 12.2–15.2)
WBC ADJUSTED: 20.6 10*9/L — ABNORMAL HIGH (ref 3.6–11.2)

## 2021-03-02 LAB — BASIC METABOLIC PANEL
ANION GAP: 10 mmol/L (ref 5–14)
BLOOD UREA NITROGEN: 22 mg/dL (ref 9–23)
BUN / CREAT RATIO: 5
CALCIUM: 8.9 mg/dL (ref 8.7–10.4)
CHLORIDE: 98 mmol/L (ref 98–107)
CO2: 26 mmol/L (ref 20.0–31.0)
CREATININE: 4.33 mg/dL — ABNORMAL HIGH
EGFR CKD-EPI AA FEMALE: 11 mL/min/{1.73_m2} — ABNORMAL LOW (ref >=60)
EGFR CKD-EPI NON-AA FEMALE: 10 mL/min/{1.73_m2} — ABNORMAL LOW (ref >=60)
GLUCOSE RANDOM: 182 mg/dL — ABNORMAL HIGH (ref 70–179)
POTASSIUM: 3.8 mmol/L (ref 3.4–4.8)
SODIUM: 134 mmol/L — ABNORMAL LOW (ref 135–145)

## 2021-03-02 LAB — SLIDE REVIEW

## 2021-03-02 LAB — MAGNESIUM: MAGNESIUM: 1.8 mg/dL (ref 1.6–2.6)

## 2021-03-02 LAB — PHOSPHORUS: PHOSPHORUS: 5.4 mg/dL — ABNORMAL HIGH (ref 2.4–5.1)

## 2021-03-02 MED ADMIN — losartan (COZAAR) tablet 100 mg: 100 mg | ORAL | @ 12:00:00

## 2021-03-02 MED ADMIN — cefTRIAXone (ROCEPHIN) 2 g in sodium chloride 0.9 % (NS) 100 mL IVPB-connector bag: 2 g | INTRAVENOUS | @ 01:00:00 | Stop: 2021-03-01

## 2021-03-02 MED ADMIN — hydrALAZINE (APRESOLINE) tablet 50 mg: 50 mg | ORAL | @ 12:00:00

## 2021-03-02 MED ADMIN — acetaminophen (TYLENOL) tablet 1,000 mg: 1000 mg | ORAL | @ 18:00:00

## 2021-03-02 MED ADMIN — carvediloL (COREG) tablet 25 mg: 25 mg | ORAL | @ 01:00:00

## 2021-03-02 MED ADMIN — acetaminophen (TYLENOL) tablet 1,000 mg: 1000 mg | ORAL | @ 01:00:00

## 2021-03-02 MED ADMIN — atorvastatin (LIPITOR) tablet 40 mg: 40 mg | ORAL | @ 12:00:00

## 2021-03-02 MED ADMIN — zanubrutinib (BRUKINSA) capsule 160 mg: 160 mg | ORAL | @ 19:00:00

## 2021-03-02 MED ADMIN — heparin (porcine) 1000 unit/mL injection 3,000 Units: 3000 [IU] | INTRAVENOUS | @ 13:00:00

## 2021-03-02 MED ADMIN — ondansetron (ZOFRAN) injection 4 mg: 4 mg | INTRAVENOUS | @ 12:00:00 | Stop: 2021-03-02

## 2021-03-02 MED ADMIN — hydrALAZINE (APRESOLINE) tablet 50 mg: 50 mg | ORAL | @ 22:00:00

## 2021-03-02 MED ADMIN — diclofenac sodium (VOLTAREN) 1 % gel 2 g: 2 g | TOPICAL | @ 12:00:00

## 2021-03-02 MED ADMIN — diclofenac sodium (VOLTAREN) 1 % gel 2 g: 2 g | TOPICAL | @ 22:00:00

## 2021-03-02 MED ADMIN — heparin (porcine) 5,000 unit/mL injection 5,000 Units: 5000 [IU] | SUBCUTANEOUS | @ 12:00:00

## 2021-03-02 MED ADMIN — melatonin tablet 3 mg: 3 mg | ORAL | @ 22:00:00

## 2021-03-02 MED ADMIN — diclofenac sodium (VOLTAREN) 1 % gel 2 g: 2 g | TOPICAL | @ 01:00:00

## 2021-03-02 MED ADMIN — heparin (porcine) 5,000 unit/mL injection 5,000 Units: 5000 [IU] | SUBCUTANEOUS | @ 01:00:00

## 2021-03-02 MED ADMIN — amLODIPine (NORVASC) tablet 2.5 mg: 2.5 mg | ORAL | @ 19:00:00

## 2021-03-02 MED ADMIN — sertraline (ZOLOFT) tablet 100 mg: 100 mg | ORAL | @ 01:00:00

## 2021-03-02 MED ADMIN — hydrALAZINE (APRESOLINE) tablet 50 mg: 50 mg | ORAL | @ 03:00:00

## 2021-03-02 MED ADMIN — acetaminophen (TYLENOL) tablet 1,000 mg: 1000 mg | ORAL | @ 12:00:00

## 2021-03-02 MED ADMIN — epoetin alfa-EPBx (RETACRIT) injection 4,000 Units: 4000 [IU] | INTRAVENOUS | @ 14:00:00

## 2021-03-02 MED ADMIN — diclofenac sodium (VOLTAREN) 1 % gel 2 g: 2 g | TOPICAL | @ 18:00:00

## 2021-03-02 MED ADMIN — lidocaine (LIDODERM) 5 % patch 2 patch: 2 | TRANSDERMAL | @ 03:00:00

## 2021-03-02 MED ADMIN — NIFEdipine (PROCARDIA XL) 24 hr tablet 90 mg: 90 mg | ORAL | @ 18:00:00

## 2021-03-02 MED ADMIN — carvediloL (COREG) tablet 25 mg: 25 mg | ORAL | @ 12:00:00

## 2021-03-02 MED ADMIN — hydrALAZINE (APRESOLINE) tablet 50 mg: 50 mg | ORAL | @ 18:00:00

## 2021-03-02 MED ADMIN — paricalcitoL (ZEMPLAR) injection 5 mcg: 5 ug | INTRAVENOUS | @ 14:00:00

## 2021-03-02 MED ADMIN — furosemide (LASIX) tablet 80 mg: 80 mg | ORAL | @ 12:00:00

## 2021-03-02 NOTE — Unmapped (Signed)
HEMODIALYSIS NURSE PROCEDURE NOTE       Treatment Number:  8 Room / Station:  8    Procedure Date:  03/02/21 Device Name/Number: Tomma Lightning    Total Dialysis Treatment Time:  218 Min.    CONSENT:    Written consent was obtained prior to the procedure and is detailed in the medical record.  Prior to the start of the procedure, a time out was taken and the identity of the patient was confirmed via name, medical record number and date of birth.     WEIGHT:  Hemodialysis Pre-Treatment Weights     Date/Time Pre-Treatment Weight (kg) Estimated Dry Weight (kg) Patient Goal Weight (kg) Total Goal Weight (kg)    03/02/21 0845 48 kg (105 lb 13.1 oz) 47.5 kg (104 lb 11.5 oz) 0.5 kg (1 lb 1.6 oz) 1 kg (2 lb 3.3 oz)         Hemodialysis Post Treatment Weights     Date/Time Post-Treatment Weight (kg) Treatment Weight Change (kg)    03/02/21 1235 47.5 kg (104 lb 11.5 oz) -0.5 kg        Active Dialysis Orders (168h ago, onward)     Start     Ordered    02/28/21 1511  Hemodialysis inpatient  Every Tue,Thu,Sat      Comments: RIJ IS NOT A DIALYSIS ACCESS   Question Answer Comment   K+ 3 meq/L    Ca++ 2.5 meq/L    Bicarb 35 meq/L    Na+ 137 meq/L    Na+ Modeling no    Dialyzer F180NR    Dialysate Temperature (C) 37    BFR-As tolerated to a maximum of: 500 mL/min    DFR 800 mL/min    Duration of treatment 3.5 Hr    Dry weight (kg) 47.5kg (new 5/10)    Challenge dry weight (kg) no    Fluid removal (L) 1L 5/10    Tubing Adult = 142 ml    Access Site AVG    Access Site Location Left    Keep SBP >: 95        02/28/21 1510    02/23/21 1500  Hemodialysis inpatient  Every Tue,Thu,Sat,   Status:  Canceled      Comments: RIJ IS NOT A DIALYSIS ACCESS   Question Answer Comment   K+ 3 meq/L    Ca++ 2.5 meq/L    Bicarb 35 meq/L    Na+ 137 meq/L    Na+ Modeling no    Dialyzer F180NR    Dialysate Temperature (C) 37    BFR-As tolerated to a maximum of: 500 mL/min    DFR 800 mL/min    Duration of treatment 3.5 Hr    Dry weight (kg) 48.5 kg    Challenge dry weight (kg) no    Fluid removal (L) to edw    Tubing Adult = 142 ml    Access Site AVG    Access Site Location Left    Keep SBP >: 95        02/23/21 1459              ASSESSMENT:  General appearance: alert  Neurologic: Grossly normal  Lungs: clear to auscultation bilaterally  Heart: regular rate and rhythm, S1, S2 normal, no murmur, click, rub or gallop  Abdomen: soft, non-tender; bowel sounds normal; no masses,  no organomegaly  Pulses: 2+ and symmetric.  Skin: Skin color, texture, turgor normal. No rashes or lesions    ACCESS  SITE:             Arteriovenous Fistula - Vein Graft  Access Arteriovenous vein graft Left;Upper Arm (Active)   Site Assessment Clean;Intact;Dry 03/02/21 1230   AV Fistula Thrill Present;Bruit Present 03/02/21 1230   Status Deaccessed 03/02/21 1230   Dressing Intervention No intervention needed 03/02/21 1230   Dressing Status      Clean;Dry;Intact/not removed 03/02/21 1230   Site Condition No complications 03/02/21 1230   Dressing Occlusive;Gauze 03/02/21 1230   Dressing To Be Removed (Date/Time) 4-6 hours post HD 03/02/21 1230     Catheter fill volumes:    Arterial:  mL Venous:  mL   Catheter filled with  post procedure.     Patient Lines/Drains/Airways Status     Active Peripheral & Central Intravenous Access     Name Placement date Placement time Site Days    Peripheral IV 02/27/21 Posterior;Right Forearm 02/27/21  0126  Forearm  3               LAB RESULTS:  Lab Results   Component Value Date    NA 134 (L) 03/02/2021    K 3.8 03/02/2021    CL 98 03/02/2021    CO2 26.0 03/02/2021    BUN 22 03/02/2021    CREATININE 4.33 (H) 03/02/2021    GLU 182 (H) 03/02/2021    CALCIUM 8.9 03/02/2021    PHOS 5.4 (H) 03/02/2021    MG 1.8 03/02/2021    IRON 35 (L) 02/13/2021    LABIRON 21 02/13/2021    FERRITIN 2,051.1 (H) 02/13/2021    TIBC 163 (L) 02/13/2021     Lab Results   Component Value Date    WBC 20.6 (H) 03/02/2021    HGB 7.9 (L) 03/02/2021    HCT 23.9 (L) 03/02/2021    PLT 75 (L) 03/02/2021 APTT 33.0 02/15/2021        VITAL SIGNS:   Temperature     Date/Time Temp Temp src       03/02/21 1230 36.2 ??C (97.2 ??F) Oral         Hemodynamics     Date/Time Pulse BP MAP (mmHg) Arterial Line BP    03/02/21 1230 75 156/66 ??? --    03/02/21 1200 71 145/65 ??? --    03/02/21 1130 74 152/67 ??? --    03/02/21 1100 76 154/61 ??? --    Date/Time Arterial Line MAP Arterial Line BP 2 Arterial Line MAP Patient Position    03/02/21 1230 -- -- -- Sitting    03/02/21 1200 -- -- -- Sitting    03/02/21 1130 -- -- -- Sitting    03/02/21 1100 -- -- -- Sitting          Oxygen Therapy     Date/Time Resp SpO2 O2 Device FiO2 (%) O2 Flow Rate (L/min)    03/02/21 1230 16 ??? ??? -- ???    03/02/21 1200 18 ??? ??? -- ???    03/02/21 1130 18 ??? ??? -- ???    03/02/21 1100 16 ??? ??? -- ???          Pre-Hemodialysis Assessment     Date/Time Therapy Number Dialyzer Hemodialysis Line Type All Machine Alarms Passed    03/02/21 0845 8 F-180 (98 mLs) Adult (142 m/s) Yes    Date/Time Air Detector Saline Line Double Clampled Hemo-Safe Applied Dialysis Flow (mL/min)    03/02/21 0845 Engaged ??? ??? 800 mL/min    Date/Time Verify Priming Solution  Priming Volume Hemodialysis Independent pH Hemodialysis Machine Conductivity (mS/cm)    03/02/21 0845 0.9% NS 300 mL ???  n/a 13.8 mS/cm    Date/Time Hemodialysis Independent Conductivity (mS/cm) Bicarb Conductivity Residual Bleach Negative Total Chlorine    03/02/21 0845 13.7 mS/cm -- Yes 0        Pre-Hemodialysis Treatment Comments     Date/Time Pre-Hemodialysis Comments    03/02/21 0845 alert, NAD        Hemodialysis Treatment     Date/Time Blood Flow Rate (mL/min) Arterial Pressure (mmHg) Venous Pressure (mmHg) Transmembrane Pressure (mmHg)    03/02/21 1230 400 mL/min -170 mmHg 160 mmHg 50 mmHg    03/02/21 1200 400 mL/min -170 mmHg 160 mmHg 50 mmHg    03/02/21 1130 400 mL/min -170 mmHg 160 mmHg 50 mmHg    03/02/21 1100 400 mL/min -170 mmHg 160 mmHg 50 mmHg    03/02/21 1030 400 mL/min -170 mmHg 160 mmHg 50 mmHg    03/02/21 1000 400 mL/min -170 mmHg 160 mmHg 50 mmHg    03/02/21 0930 400 mL/min -140 mmHg 170 mmHg 50 mmHg    03/02/21 0852 400 mL/min -140 mmHg 170 mmHg 50 mmHg    Date/Time Ultrafiltration Rate (mL/hr) Ultrafiltrate Removed (mL) Dialysate Flow Rate (mL/min) KECN (Kecn)    03/02/21 1230 280 mL/hr 1050 mL 800 ml/min ???    03/02/21 1200 280 mL/hr 925 mL 800 ml/min ???    03/02/21 1130 280 mL/hr 835 mL 800 ml/min ???    03/02/21 1100 280 mL/hr 825 mL 800 ml/min ???    03/02/21 1030 280 mL/hr 645 mL 800 ml/min ???    03/02/21 1000 280 mL/hr 485 mL 800 ml/min ???    03/02/21 0930 440 mL/hr 358 mL 800 ml/min ???    03/02/21 0852 440 mL/hr 0 mL 800 ml/min ???        Hemodialysis Treatment Comments     Date/Time Intra-Hemodialysis Comments    03/02/21 1230 Tx completed, VSS, NAD    03/02/21 1200 VSS    03/02/21 1130 NAD    03/02/21 1100 VSS    03/02/21 1030 NAD    03/02/21 1000 NAD    03/02/21 0930 VS stable    03/02/21 0852 Tx started, NAD, VSS        Post Treatment     Date/Time Rinseback Volume (mL) On Line Clearance: spKt/V Total Liters Processed (L/min) Dialyzer Clearance    03/02/21 1235 300 mL 1.98 spKt/V 77.2 L/min Lightly streaked        Post Hemodialysis Treatment Comments     Date/Time Post-Hemodialysis Comments    03/02/21 1235 alert, NAD        Hemodialysis I/O     Date/Time Total Hemodialysis Replacement Volume (mL) Total Ultrafiltrate Output (mL)    03/02/21 1235 ??? 500 mL          [id number] - Medicaitons Given During Treatment  (last 4 hrs)         WHITNEY STOVALL, RN       Medication Name Action Time Action Route Rate Dose User     NIFEdipine (PROCARDIA XL) 24 hr tablet 90 mg 03/02/21 1332 Given Oral  90 mg Laqueta Jean, RN     acetaminophen (TYLENOL) tablet 1,000 mg 03/02/21 1332 Given Oral  1,000 mg Laqueta Jean, RN     diclofenac sodium (VOLTAREN) 1 % gel 2 g 03/02/21 1334 Given Topical  2 g Whitney Stovall, RN     hydrALAZINE (APRESOLINE) tablet 50 mg  03/02/21 1332 Given Oral  50 mg Laqueta Jean, RN     lidocaine (LIDODERM) 5 % patch 2 patch 03/02/21 1335 Patch Removed Transdermal   Laqueta Jean, RN                  Patient tolerated treatment in a  Dialysis Recliner.

## 2021-03-02 NOTE — Unmapped (Signed)
Disoriented to place. Dressing changed. Plan for DC to SNF tomorrow.    Problem: Adult Inpatient Plan of Care  Goal: Plan of Care Review  Outcome: Ongoing - Unchanged  Goal: Patient-Specific Goal (Individualized)  Outcome: Ongoing - Unchanged  Goal: Absence of Hospital-Acquired Illness or Injury  Outcome: Ongoing - Unchanged  Intervention: Identify and Manage Fall Risk  Recent Flowsheet Documentation  Taken 03/01/2021 0734 by Ronie Spies, RN  Safety Interventions:   bed alarm   bleeding precautions   commode/urinal/bedpan at bedside   fall reduction program maintained   infection management   isolation precautions   lighting adjusted for tasks/safety   low bed   no IV/BP/blood draw left arm   nonskid shoes/slippers when out of bed  Intervention: Prevent and Manage VTE (Venous Thromboembolism) Risk  Recent Flowsheet Documentation  Taken 03/01/2021 0734 by Ronie Spies, RN  Activity Management: activity encouraged  Intervention: Prevent Infection  Recent Flowsheet Documentation  Taken 03/01/2021 0734 by Ronie Spies, RN  Infection Prevention: hand hygiene promoted  Goal: Optimal Comfort and Wellbeing  Outcome: Ongoing - Unchanged  Goal: Readiness for Transition of Care  Outcome: Ongoing - Unchanged  Goal: Rounds/Family Conference  Outcome: Ongoing - Unchanged     Problem: Self-Care Deficit  Goal: Improved Ability to Complete Activities of Daily Living  Outcome: Ongoing - Unchanged     Problem: Fall Injury Risk  Goal: Absence of Fall and Fall-Related Injury  Outcome: Ongoing - Unchanged  Intervention: Promote Injury-Free Environment  Recent Flowsheet Documentation  Taken 03/01/2021 0734 by Ronie Spies, RN  Safety Interventions:   bed alarm   bleeding precautions   commode/urinal/bedpan at bedside   fall reduction program maintained   infection management   isolation precautions   lighting adjusted for tasks/safety   low bed   no IV/BP/blood draw left arm   nonskid shoes/slippers when out of bed     Problem: Impaired Wound Healing  Goal: Optimal Wound Healing  Outcome: Ongoing - Unchanged  Intervention: Promote Wound Healing  Recent Flowsheet Documentation  Taken 03/01/2021 0734 by Ronie Spies, RN  Activity Management: activity encouraged     Problem: Device-Related Complication Risk (Hemodialysis)  Goal: Safe, Effective Therapy Delivery  Outcome: Ongoing - Unchanged     Problem: Hemodynamic Instability (Hemodialysis)  Goal: Effective Tissue Perfusion  Outcome: Ongoing - Unchanged     Problem: Infection (Hemodialysis)  Goal: Absence of Infection Signs and Symptoms  Outcome: Ongoing - Unchanged     Problem: Skin Injury Risk Increased  Goal: Skin Health and Integrity  Outcome: Ongoing - Unchanged  Intervention: Optimize Skin Protection  Recent Flowsheet Documentation  Taken 03/01/2021 0734 by Ronie Spies, RN  Pressure Reduction Techniques: frequent weight shift encouraged  Pressure Reduction Devices: pressure-redistributing mattress utilized

## 2021-03-02 NOTE — Unmapped (Signed)
[facility] Nephrology Hemodialysis Procedure Note     03/02/2021    Cristina Fields was seen and examined on hemodialysis    CHIEF COMPLAINT: End Stage Renal Disease     INTERVAL HISTORY: Vomited this morning. Otherwise no complaints.       DIALYSIS TREATMENT DATA:  Estimated Dry Weight (kg): 48.5 kg (106 lb 14.8 oz)  Patient Goal Weight (kg): 0 kg (0 lb)  Dialyzer: F-180 (98 mLs)  Dialysis Bath  Bath: 3 K+ / 2.5 Ca+  Dialysate Na (mEq/L): 137 mEq/L  Dialysate HCO3 (mEq/L): 35 mEq/L  Dialysate Total Buffer HCO3 (mEq/L): 35 mEq/L  Blood Flow Rate (mL/min): 400 mL/min  Dialysis Flow (mL/min): 800 mL/min    PHYSICAL EXAM:  Vitals:  Temp:  [36.2 ??C-36.7 ??C] 36.2 ??C  Heart Rate:  [72-94] 72  BP: (144-216)/(52-82) 145/70  MAP (mmHg):  [80-121] 121  Weights:  Pre-Treatment Weight (kg): 48.1 kg (106 lb 0.7 oz)    General: Appearing in no acute distress  Pulmonary: clear to auscultation  Cardiovascular: regular rate and rhythm  Extremities: no significant  edema  Access: LUE AV graft   Dialyzing in chair    LAB DATA:  Lab Results   Component Value Date    NA 135 03/01/2021    K 3.7 03/01/2021    K 3.7 03/01/2021    CL 98 03/01/2021    CO2 29.0 03/01/2021    BUN 12 03/01/2021    CREATININE 2.92 (H) 03/01/2021      Lab Results   Component Value Date    HCT 23.9 (L) 03/02/2021    WBC 20.6 (H) 03/02/2021        ASSESSMENT/PLAN:  End Stage Renal Disease on Intermittent Hemodialysis:  - UF goal: 0.5L as tolerated, dry weight changed on 5/10 to 47.5  - Adjust medications for a GFR <10  - Avoid nephrotoxic agents     Bone Mineral Metabolism:  Lab Results   Component Value Date    CALCIUM 8.7 03/01/2021    CALCIUM 9.0 02/28/2021    Lab Results   Component Value Date    ALBUMIN 2.7 (L) 03/01/2021    ALBUMIN 2.6 (L) 02/26/2021      Lab Results   Component Value Date    PHOS 3.5 03/01/2021    PHOS 5.3 (H) 02/28/2021    No results found for: PTH   - Labs appropriate, no changes.    Anemia:   Lab Results   Component Value Date    HGB 7.9 (L) 03/02/2021    HGB 8.2 (L) 03/01/2021    HGB 8.4 (L) 02/28/2021    Iron Saturation (%)   Date Value Ref Range Status   02/13/2021 21 % Final      Lab Results   Component Value Date    FERRITIN 2,051.1 (H) 02/13/2021       Increase to intravenous Epogen/Retacrit 4k units with each treatment.   -Change made 5/10       Justine Null, DO  [facility] Division of Nephrology & Hypertension

## 2021-03-02 NOTE — Unmapped (Signed)
Plan of care reviewed, UF goal set at 0.5 liters for a duration of 3.5 hours iHD.   Crit line monitor on.

## 2021-03-02 NOTE — Unmapped (Signed)
Pt remains free of falls, bed alarms in place and not attempting to get OOB. No new skin breakdown this shift, dressing intact. Scheduled for hemodialysis this am at 0700.       Problem: Adult Inpatient Plan of Care  Goal: Plan of Care Review  Outcome: Ongoing - Unchanged  Goal: Patient-Specific Goal (Individualized)  Outcome: Ongoing - Unchanged  Goal: Absence of Hospital-Acquired Illness or Injury  Outcome: Ongoing - Unchanged  Intervention: Identify and Manage Fall Risk  Recent Flowsheet Documentation  Taken 03/01/2021 2000 by Meyer Russel, RN  Safety Interventions:   low bed   fall reduction program maintained   bed alarm  Intervention: Prevent Skin Injury  Recent Flowsheet Documentation  Taken 03/01/2021 2000 by Meyer Russel, RN  Skin Protection:   adhesive use limited   incontinence pads utilized  Intervention: Prevent and Manage VTE (Venous Thromboembolism) Risk  Recent Flowsheet Documentation  Taken 03/01/2021 2000 by Meyer Russel, RN  Activity Management: activity adjusted per tolerance  Intervention: Prevent Infection  Recent Flowsheet Documentation  Taken 03/01/2021 2000 by Meyer Russel, RN  Infection Prevention:   cohorting utilized   hand hygiene promoted   rest/sleep promoted  Goal: Optimal Comfort and Wellbeing  Outcome: Ongoing - Unchanged  Goal: Readiness for Transition of Care  Outcome: Ongoing - Unchanged  Goal: Rounds/Family Conference  Outcome: Ongoing - Unchanged     Problem: Self-Care Deficit  Goal: Improved Ability to Complete Activities of Daily Living  Outcome: Ongoing - Unchanged     Problem: Fall Injury Risk  Goal: Absence of Fall and Fall-Related Injury  Outcome: Ongoing - Unchanged  Intervention: Promote Injury-Free Environment  Recent Flowsheet Documentation  Taken 03/01/2021 2000 by Meyer Russel, RN  Safety Interventions:   low bed   fall reduction program maintained   bed alarm     Problem: Impaired Wound Healing  Goal: Optimal Wound Healing  Outcome: Ongoing - Unchanged  Intervention: Promote Wound Healing  Recent Flowsheet Documentation  Taken 03/01/2021 2000 by Meyer Russel, RN  Activity Management: activity adjusted per tolerance     Problem: Device-Related Complication Risk (Hemodialysis)  Goal: Safe, Effective Therapy Delivery  Outcome: Ongoing - Unchanged     Problem: Hemodynamic Instability (Hemodialysis)  Goal: Effective Tissue Perfusion  Outcome: Ongoing - Unchanged     Problem: Infection (Hemodialysis)  Goal: Absence of Infection Signs and Symptoms  Outcome: Ongoing - Unchanged     Problem: Skin Injury Risk Increased  Goal: Skin Health and Integrity  Outcome: Ongoing - Unchanged  Intervention: Optimize Skin Protection  Recent Flowsheet Documentation  Taken 03/01/2021 2130 by Meyer Russel, RN  Pressure Reduction Techniques: frequent weight shift encouraged  Taken 03/01/2021 2000 by Meyer Russel, RN  Pressure Reduction Techniques: frequent weight shift encouraged  Pressure Reduction Devices: pressure-redistributing mattress utilized  Skin Protection:   adhesive use limited   incontinence pads utilized

## 2021-03-02 NOTE — Unmapped (Signed)
Hematology Resident (MEDE) Progress Note    Assessment & Plan:   Cristina Fields is a 70 y.o. female with CLL, ESRD on iHD, hypertension, prior cerebellar infarct that presented as a direct admission from infusion clinic with disorientation and pain. Found to have Strep Bovis bacteremia. She is currently finished with antibiotics and awaiting transfer to SNF.    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 *    Streptococcus bovis bacteremia: BCx 2 of 2 positive. Source possibly seeding from condyloma biopsy. TEE negative for endocarditis.   -ICID consulted, appreciate assistance  -Completed 2 week course CTX 2g daily (4/27-5/11)  -VIR removed HD tunneled line on 5/9  -Colonoscopy performed at [facility] 12/2018 with one polyp removed and was otherwise normal  -Outpatient referral to GI for colonoscopy at discharge    CLL - Anemia - Thrombocytopenia - Lymphocytosis:  Oncology history:  Follows with Dr. Lonni Fix. Diagnosed ~2012 and received fludarabine + cyclophosphamide + rituximab x6. Relapsed in 05/2014 treated with bendamustine + rituximab. Relapsed again in 2018 and started ibrutinib in 5/201. Rapid relapse in 04/2020 and was treated with rituximab and chlorambucil through November, 2021. On zanubrutinib prior to admission.   - Resumed zanubrutinib   - BMBx performed 5/6 this admission to assess persistent anemia & thrombocytopenia: Hypercellular BM (60-70%)  involved by CLL/SLL, representing ~60% marrow cellularity. Increased compared to 10/05/20 BMBx that showed normocellular bone marrow (30%), representing 40% of cellularity  -Transfuse for hgb <7, platelets <10,000    Encephalopathy: Now improved and near baseline. Worsening likely multifactorial in the setting of bacteremia per above, toxic-metabolic/uremia, and medication/pain driven superimposed on cognitive impairment at baseline (SLUMS 15 as outpatient).   -Delirium precautions    Left AC OA - Neck Pain: CT scan showed only non-specific inflammatory stranding in the fat surrounding the left shoulder and axilla.   -No further imaging indicated.   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 Seen by lower GI surgery this admission with no surgical needs at this time.  -WOCN  -Outpatient pap smear to assess for cervical HPV/malignancy  -Pain control      ESRD on iHD - HTN: TThSat Dialysis. Continues to remain hypertensive despite up-titration of home meds while admitted. Blood pressure measurements confirmed to be taken while patient is sitting up in bed. Patient reported being on amlodipine 10mg  at home, but this was not continued during admission. Will restart at low dose and uptitrate to avoid hypotension.  -iHD while inpatient  -Coreg 25 mg BID; Nifedipine XL 90 mg daily; Hydralazine 50mg  q6h; Losartan 100mg  daily  -START amlo 2.5mg  on 5/12  -Lasix 80 mg daily    Daily Checklist:  Diet: Regular Diet  DVT PPx: Heparin TID  Electrolytes: Replete Potassium to >/= 3.6 and Magnesium to >/= 1.8  Code Status: Full Code  Dispo: SNF on 5/11 after completion of IV antibiotics    Team Contact Information:   Primary Team: Hematology Resident (MEDE)  Primary Resident: Pearline Cables, MD  Resident's Pager: [phone] (Hematology Intern - [place]on Asters)    Interval History:  No acute events overnight.    Blood pressure elevated to 216/82 this morning with some nausea and vomiting at the time; nausea responded to zofran.     Awaiting transfer to SNF, pending SNF confirming [facility] authorization & arrangement of transportation to dialysis.    ROS otherwise negative.     Objective:   Temp:  [36.2 ??C-36.7 ??C] 36.2 ??C  Heart Rate:  [72-94] 74  Resp:  [16-20] 18  BP: (138-216)/(52-82) 152/67  SpO2:  [96 %-100 %] 100 %      General: Sitting up in bed, NAD  Pulm: Normal work of breathing on RA.  CV: Regular rate.   Neuro: Alert, no focal deficits.  Psych: Mood and affect appropriate.     Labs/Studies: Labs and Studies from the last 24hrs per EMR and Reviewed

## 2021-03-03 LAB — CBC W/ AUTO DIFF
BASOPHILS ABSOLUTE COUNT: 0.2 10*9/L — ABNORMAL HIGH (ref 0.0–0.1)
BASOPHILS RELATIVE PERCENT: 0.6 %
EOSINOPHILS ABSOLUTE COUNT: 0.3 10*9/L (ref 0.0–0.5)
EOSINOPHILS RELATIVE PERCENT: 1.2 %
HEMATOCRIT: 24.9 % — ABNORMAL LOW (ref 34.0–44.0)
HEMOGLOBIN: 7.8 g/dL — ABNORMAL LOW (ref 11.3–14.9)
LYMPHOCYTES ABSOLUTE COUNT: 24.7 10*9/L — ABNORMAL HIGH (ref 1.1–3.6)
LYMPHOCYTES RELATIVE PERCENT: 88.7 %
MEAN CORPUSCULAR HEMOGLOBIN CONC: 31.5 g/dL — ABNORMAL LOW (ref 32.0–36.0)
MEAN CORPUSCULAR HEMOGLOBIN: 27.5 pg (ref 25.9–32.4)
MEAN CORPUSCULAR VOLUME: 87.2 fL (ref 77.6–95.7)
MEAN PLATELET VOLUME: 10.9 fL — ABNORMAL HIGH (ref 6.8–10.7)
MONOCYTES ABSOLUTE COUNT: 0.3 10*9/L (ref 0.3–0.8)
MONOCYTES RELATIVE PERCENT: 1.2 %
NEUTROPHILS ABSOLUTE COUNT: 2.3 10*9/L (ref 1.8–7.8)
NEUTROPHILS RELATIVE PERCENT: 8.3 %
PLATELET COUNT: 81 10*9/L — ABNORMAL LOW (ref 150–450)
RED BLOOD CELL COUNT: 2.85 10*12/L — ABNORMAL LOW (ref 3.95–5.13)
RED CELL DISTRIBUTION WIDTH: 21.2 % — ABNORMAL HIGH (ref 12.2–15.2)
WBC ADJUSTED: 27.8 10*9/L — ABNORMAL HIGH (ref 3.6–11.2)

## 2021-03-03 LAB — MAGNESIUM: MAGNESIUM: 1.8 mg/dL (ref 1.6–2.6)

## 2021-03-03 LAB — PHOSPHORUS: PHOSPHORUS: 3.9 mg/dL (ref 2.4–5.1)

## 2021-03-03 MED ADMIN — hydrALAZINE (APRESOLINE) tablet 50 mg: 50 mg | ORAL | @ 23:00:00

## 2021-03-03 MED ADMIN — acetaminophen (TYLENOL) tablet 1,000 mg: 1000 mg | ORAL | @ 10:00:00

## 2021-03-03 MED ADMIN — diclofenac sodium (VOLTAREN) 1 % gel 2 g: 2 g | TOPICAL | @ 10:00:00

## 2021-03-03 MED ADMIN — hydrALAZINE (APRESOLINE) tablet 50 mg: 50 mg | ORAL | @ 18:00:00

## 2021-03-03 MED ADMIN — losartan (COZAAR) tablet 100 mg: 100 mg | ORAL | @ 14:00:00

## 2021-03-03 MED ADMIN — acetaminophen (TYLENOL) tablet 1,000 mg: 1000 mg | ORAL | @ 18:00:00

## 2021-03-03 MED ADMIN — diclofenac sodium (VOLTAREN) 1 % gel 2 g: 2 g | TOPICAL | @ 01:00:00

## 2021-03-03 MED ADMIN — heparin (porcine) 5,000 unit/mL injection 5,000 Units: 5000 [IU] | SUBCUTANEOUS | @ 01:00:00

## 2021-03-03 MED ADMIN — lidocaine (LIDODERM) 5 % patch 2 patch: 2 | TRANSDERMAL | @ 05:00:00

## 2021-03-03 MED ADMIN — carvediloL (COREG) tablet 25 mg: 25 mg | ORAL | @ 01:00:00

## 2021-03-03 MED ADMIN — hydrALAZINE (APRESOLINE) tablet 50 mg: 50 mg | ORAL | @ 04:00:00

## 2021-03-03 MED ADMIN — sertraline (ZOLOFT) tablet 100 mg: 100 mg | ORAL | @ 01:00:00

## 2021-03-03 MED ADMIN — diclofenac sodium (VOLTAREN) 1 % gel 2 g: 2 g | TOPICAL | @ 23:00:00

## 2021-03-03 MED ADMIN — diclofenac sodium (VOLTAREN) 1 % gel 2 g: 2 g | TOPICAL | @ 18:00:00

## 2021-03-03 MED ADMIN — acetaminophen (TYLENOL) tablet 1,000 mg: 1000 mg | ORAL | @ 01:00:00

## 2021-03-03 MED ADMIN — melatonin tablet 3 mg: 3 mg | ORAL | @ 23:00:00

## 2021-03-03 MED ADMIN — NIFEdipine (PROCARDIA XL) 24 hr tablet 90 mg: 90 mg | ORAL | @ 14:00:00

## 2021-03-03 MED ADMIN — furosemide (LASIX) tablet 80 mg: 80 mg | ORAL | @ 14:00:00

## 2021-03-03 MED ADMIN — carvediloL (COREG) tablet 25 mg: 25 mg | ORAL | @ 14:00:00

## 2021-03-03 MED ADMIN — amLODIPine (NORVASC) tablet 2.5 mg: 2.5 mg | ORAL | @ 14:00:00

## 2021-03-03 MED ADMIN — hydrALAZINE (APRESOLINE) tablet 50 mg: 50 mg | ORAL | @ 10:00:00

## 2021-03-03 MED ADMIN — atorvastatin (LIPITOR) tablet 40 mg: 40 mg | ORAL | @ 14:00:00

## 2021-03-03 NOTE — Unmapped (Signed)
Hematology Resident (MEDE) Progress Note    Assessment & Plan:   Cristina Fields is a 70 y.o. female with CLL, ESRD on iHD, hypertension, prior cerebellar infarct that presented as a direct admission from infusion clinic with disorientation and pain. Found to have Strep Bovis bacteremia. She is currently finished with antibiotics and awaiting transfer to SNF.    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 *    Streptococcus bovis bacteremia (resolved): BCx 2 of 2 positive. Source possibly seeding from condyloma biopsy. TEE negative for endocarditis.   -ICID consulted, appreciate assistance  -Completed 2 week course CTX 2g daily (4/27-5/11)  -VIR removed HD tunneled line on 5/9  -Colonoscopy performed at [facility] 12/2018 with one polyp removed and was otherwise normal  -Outpatient referral to GI for colonoscopy at discharge    CLL - Anemia - Thrombocytopenia - Lymphocytosis:  Oncology history:  Follows with Dr. Lonni Fix. Diagnosed ~2012 and received fludarabine + cyclophosphamide + rituximab x6. Relapsed in 05/2014 treated with bendamustine + rituximab. Relapsed again in 2018 and started ibrutinib in 5/201. Rapid relapse in 04/2020 and was treated with rituximab and chlorambucil through November, 2021. On zanubrutinib prior to admission.   - Resumed zanubrutinib   - BMBx performed 5/6 this admission to assess persistent anemia & thrombocytopenia showing hypercellularity and increased marrow cellularity compared to previous. However, this biopsy was done after zanubrutinib was stopped. Recommending repeat BMBx in a few months while on zanubrutinib.  -Transfuse for hgb <7, platelets <10,000    Encephalopathy: Now improved and near baseline. Worsening likely multifactorial in the setting of bacteremia per above, toxic-metabolic/uremia, and medication/pain driven superimposed on cognitive impairment at baseline (SLUMS 15 as outpatient).   -Delirium precautions    Left AC OA - Neck Pain: CT scan showed only non-specific inflammatory stranding in the fat surrounding the left shoulder and axilla.   -No further imaging indicated.   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 Seen by lower GI surgery this admission with no surgical needs at this time.  -WOCN  -Outpatient pap smear to assess for cervical HPV/malignancy  -Pain control      ESRD on iHD - HTN: TThSat Dialysis. Continues to remain hypertensive despite up-titration of home meds while admitted. Blood pressure measurements confirmed to be taken while patient is sitting up in bed. Patient reported being on amlodipine 10mg  at home, but this was not continued during admission. Will restart at low dose and uptitrate to avoid hypotension.  -iHD while inpatient  -Coreg 25 mg BID; Nifedipine XL 90 mg daily; Hydralazine 50mg  q6h; Losartan 100mg  daily  -Amlo 2.5mg  started on 5/12  -Lasix 80 mg daily    Daily Checklist:  Diet: Regular Diet  DVT PPx: Heparin TID  Electrolytes: Replete Potassium to >/= 3.6 and Magnesium to >/= 1.8  Code Status: Full Code  Dispo: SNF on 5/11 after completion of IV antibiotics    Team Contact Information:   Primary Team: Hematology Resident (MEDE)  Primary Resident: Pearline Cables, MD  Resident's Pager: [phone] (Hematology Intern - Cliffton Asters)  Interval History:     No acute events overnight.    Awaiting transfer to SNF with expected date 5/16.    ROS otherwise negative.     Objective:   Temp:  [36.5 ??C-36.9 ??C] 36.9 ??C  Heart Rate:  [71-88] 80  Resp:  [16-18] 18  BP: (130-175)/(53-69) 137/58  SpO2:  [94 %-98 %] 98 %      General: Sitting up in bed, NAD  Pulm: Normal work of breathing on RA.  CV: Regular rate.   Neuro: Alert, no focal deficits.  Psych: Mood and affect appropriate.     Labs/Studies: Labs and Studies from the last 24hrs per EMR and Reviewed

## 2021-03-03 NOTE — Unmapped (Signed)
Pt a+o x 4 this shift. No complaints of pain. Medicated prn x 1 for nausea. Dialysis completed this shift. Pending SNF placement. Pt resting quietly and denies needs at this time.     Problem: Adult Inpatient Plan of Care  Goal: Plan of Care Review  Outcome: Ongoing - Unchanged  Goal: Patient-Specific Goal (Individualized)  Outcome: Ongoing - Unchanged  Goal: Absence of Hospital-Acquired Illness or Injury  Outcome: Ongoing - Unchanged  Intervention: Identify and Manage Fall Risk  Recent Flowsheet Documentation  Taken 03/02/2021 0710 by Laqueta Jean, RN  Safety Interventions:   commode/urinal/bedpan at bedside   lighting adjusted for tasks/safety   low bed   environmental modification   fall reduction program maintained  Intervention: Prevent and Manage VTE (Venous Thromboembolism) Risk  Recent Flowsheet Documentation  Taken 03/02/2021 0710 by Laqueta Jean, RN  Activity Management: activity adjusted per tolerance  Goal: Optimal Comfort and Wellbeing  Outcome: Ongoing - Unchanged  Goal: Readiness for Transition of Care  Outcome: Ongoing - Unchanged  Goal: Rounds/Family Conference  Outcome: Ongoing - Unchanged     Problem: Self-Care Deficit  Goal: Improved Ability to Complete Activities of Daily Living  Outcome: Ongoing - Unchanged     Problem: Fall Injury Risk  Goal: Absence of Fall and Fall-Related Injury  Outcome: Ongoing - Unchanged  Intervention: Promote Injury-Free Environment  Recent Flowsheet Documentation  Taken 03/02/2021 0710 by Laqueta Jean, RN  Safety Interventions:   commode/urinal/bedpan at bedside   lighting adjusted for tasks/safety   low bed   environmental modification   fall reduction program maintained     Problem: Impaired Wound Healing  Goal: Optimal Wound Healing  Outcome: Ongoing - Unchanged  Intervention: Promote Wound Healing  Recent Flowsheet Documentation  Taken 03/02/2021 0710 by Laqueta Jean, RN  Activity Management: activity adjusted per tolerance     Problem: Device-Related Complication Risk (Hemodialysis)  Goal: Safe, Effective Therapy Delivery  Outcome: Ongoing - Unchanged     Problem: Hemodynamic Instability (Hemodialysis)  Goal: Effective Tissue Perfusion  Outcome: Ongoing - Unchanged     Problem: Infection (Hemodialysis)  Goal: Absence of Infection Signs and Symptoms  Outcome: Ongoing - Unchanged     Problem: Skin Injury Risk Increased  Goal: Skin Health and Integrity  Outcome: Ongoing - Unchanged  Intervention: Optimize Skin Protection  Recent Flowsheet Documentation  Taken 03/02/2021 0710 by Laqueta Jean, RN  Pressure Reduction Techniques: frequent weight shift encouraged  Pressure Reduction Devices: pressure-redistributing mattress utilized

## 2021-03-04 LAB — CBC
HEMATOCRIT: 25.4 % — ABNORMAL LOW (ref 34.0–44.0)
HEMOGLOBIN: 8.1 g/dL — ABNORMAL LOW (ref 11.3–14.9)
MEAN CORPUSCULAR HEMOGLOBIN CONC: 31.9 g/dL — ABNORMAL LOW (ref 32.0–36.0)
MEAN CORPUSCULAR HEMOGLOBIN: 28 pg (ref 25.9–32.4)
MEAN CORPUSCULAR VOLUME: 87.8 fL (ref 77.6–95.7)
MEAN PLATELET VOLUME: 9.6 fL (ref 6.8–10.7)
PLATELET COUNT: 66 10*9/L — ABNORMAL LOW (ref 150–450)
RED BLOOD CELL COUNT: 2.89 10*12/L — ABNORMAL LOW (ref 3.95–5.13)
RED CELL DISTRIBUTION WIDTH: 21.7 % — ABNORMAL HIGH (ref 12.2–15.2)
WBC ADJUSTED: 18.2 10*9/L — ABNORMAL HIGH (ref 3.6–11.2)

## 2021-03-04 LAB — HEPATIC FUNCTION PANEL
ALBUMIN: 2.7 g/dL — ABNORMAL LOW (ref 3.4–5.0)
ALKALINE PHOSPHATASE: 148 U/L — ABNORMAL HIGH (ref 46–116)
ALT (SGPT): 15 U/L (ref 10–49)
AST (SGOT): 19 U/L (ref <=34)
BILIRUBIN DIRECT: 0.1 mg/dL (ref 0.00–0.30)
BILIRUBIN TOTAL: 0.2 mg/dL — ABNORMAL LOW (ref 0.3–1.2)
PROTEIN TOTAL: 5 g/dL — ABNORMAL LOW (ref 5.7–8.2)

## 2021-03-04 LAB — BASIC METABOLIC PANEL
ANION GAP: 6 mmol/L (ref 5–14)
BLOOD UREA NITROGEN: 9 mg/dL (ref 9–23)
BUN / CREAT RATIO: 4
CALCIUM: 9 mg/dL (ref 8.7–10.4)
CHLORIDE: 99 mmol/L (ref 98–107)
CO2: 32 mmol/L — ABNORMAL HIGH (ref 20.0–31.0)
CREATININE: 2.03 mg/dL — ABNORMAL HIGH
EGFR CKD-EPI AA FEMALE: 28 mL/min/{1.73_m2} — ABNORMAL LOW (ref >=60)
EGFR CKD-EPI NON-AA FEMALE: 24 mL/min/{1.73_m2} — ABNORMAL LOW (ref >=60)
GLUCOSE RANDOM: 145 mg/dL (ref 70–179)
POTASSIUM: 3.5 mmol/L (ref 3.4–4.8)
SODIUM: 137 mmol/L (ref 135–145)

## 2021-03-04 LAB — CBC W/ AUTO DIFF
BASOPHILS ABSOLUTE COUNT: 0.2 10*9/L — ABNORMAL HIGH (ref 0.0–0.1)
BASOPHILS RELATIVE PERCENT: 0.7 %
EOSINOPHILS ABSOLUTE COUNT: 0.4 10*9/L (ref 0.0–0.5)
EOSINOPHILS RELATIVE PERCENT: 1.4 %
HEMATOCRIT: 24.8 % — ABNORMAL LOW (ref 34.0–44.0)
HEMOGLOBIN: 7.8 g/dL — ABNORMAL LOW (ref 11.3–14.9)
LYMPHOCYTES ABSOLUTE COUNT: 25.5 10*9/L — ABNORMAL HIGH (ref 1.1–3.6)
LYMPHOCYTES RELATIVE PERCENT: 86 %
MEAN CORPUSCULAR HEMOGLOBIN CONC: 31.4 g/dL — ABNORMAL LOW (ref 32.0–36.0)
MEAN CORPUSCULAR HEMOGLOBIN: 27.5 pg (ref 25.9–32.4)
MEAN CORPUSCULAR VOLUME: 87.5 fL (ref 77.6–95.7)
MEAN PLATELET VOLUME: 10.2 fL (ref 6.8–10.7)
MONOCYTES ABSOLUTE COUNT: 0.4 10*9/L (ref 0.3–0.8)
MONOCYTES RELATIVE PERCENT: 1.4 %
NEUTROPHILS ABSOLUTE COUNT: 3.1 10*9/L (ref 1.8–7.8)
NEUTROPHILS RELATIVE PERCENT: 10.5 %
PLATELET COUNT: 81 10*9/L — ABNORMAL LOW (ref 150–450)
RED BLOOD CELL COUNT: 2.83 10*12/L — ABNORMAL LOW (ref 3.95–5.13)
RED CELL DISTRIBUTION WIDTH: 21.6 % — ABNORMAL HIGH (ref 12.2–15.2)
WBC ADJUSTED: 29.7 10*9/L — ABNORMAL HIGH (ref 3.6–11.2)

## 2021-03-04 LAB — PROTIME-INR
INR: 1.3
PROTIME: 15.2 s — ABNORMAL HIGH (ref 10.3–13.4)

## 2021-03-04 LAB — MAGNESIUM: MAGNESIUM: 1.8 mg/dL (ref 1.6–2.6)

## 2021-03-04 LAB — PHOSPHORUS: PHOSPHORUS: 4.8 mg/dL (ref 2.4–5.1)

## 2021-03-04 MED ADMIN — heparin (porcine) 5,000 unit/mL injection 5,000 Units: 5000 [IU] | SUBCUTANEOUS | @ 01:00:00

## 2021-03-04 MED ADMIN — melatonin tablet 3 mg: 3 mg | ORAL | @ 23:00:00

## 2021-03-04 MED ADMIN — atorvastatin (LIPITOR) tablet 40 mg: 40 mg | ORAL | @ 23:00:00

## 2021-03-04 MED ADMIN — furosemide (LASIX) tablet 80 mg: 80 mg | ORAL | @ 23:00:00

## 2021-03-04 MED ADMIN — zanubrutinib (BRUKINSA) capsule 160 mg: 160 mg | ORAL | @ 23:00:00

## 2021-03-04 MED ADMIN — paricalcitoL (ZEMPLAR) injection 5 mcg: 5 ug | INTRAVENOUS | @ 21:00:00

## 2021-03-04 MED ADMIN — sertraline (ZOLOFT) tablet 100 mg: 100 mg | ORAL | @ 01:00:00

## 2021-03-04 MED ADMIN — hydrALAZINE (APRESOLINE) tablet 50 mg: 50 mg | ORAL | @ 07:00:00

## 2021-03-04 MED ADMIN — acetaminophen (TYLENOL) tablet 1,000 mg: 1000 mg | ORAL | @ 23:00:00

## 2021-03-04 MED ADMIN — hydrALAZINE (APRESOLINE) tablet 50 mg: 50 mg | ORAL | @ 23:00:00

## 2021-03-04 MED ADMIN — losartan (COZAAR) tablet 100 mg: 100 mg | ORAL | @ 23:00:00

## 2021-03-04 MED ADMIN — diclofenac sodium (VOLTAREN) 1 % gel 2 g: 2 g | TOPICAL | @ 10:00:00

## 2021-03-04 MED ADMIN — carvediloL (COREG) tablet 25 mg: 25 mg | ORAL | @ 01:00:00

## 2021-03-04 MED ADMIN — NIFEdipine (PROCARDIA XL) 24 hr tablet 90 mg: 90 mg | ORAL | @ 23:00:00

## 2021-03-04 MED ADMIN — acetaminophen (TYLENOL) tablet 1,000 mg: 1000 mg | ORAL | @ 10:00:00

## 2021-03-04 MED ADMIN — heparin (porcine) 1000 unit/mL injection 3,000 Units: 3000 [IU] | INTRAVENOUS | @ 18:00:00

## 2021-03-04 MED ADMIN — epoetin alfa-EPBx (RETACRIT) injection 4,000 Units: 4000 [IU] | INTRAVENOUS | @ 21:00:00

## 2021-03-04 MED ADMIN — acetaminophen (TYLENOL) tablet 1,000 mg: 1000 mg | ORAL | @ 01:00:00

## 2021-03-04 MED ADMIN — carvediloL (COREG) tablet 25 mg: 25 mg | ORAL | @ 23:00:00

## 2021-03-04 MED ADMIN — amLODIPine (NORVASC) tablet 2.5 mg: 2.5 mg | ORAL | @ 23:00:00

## 2021-03-04 MED ADMIN — diclofenac sodium (VOLTAREN) 1 % gel 2 g: 2 g | TOPICAL | @ 23:00:00

## 2021-03-04 MED ADMIN — lidocaine (LIDODERM) 5 % patch 2 patch: 2 | TRANSDERMAL | @ 07:00:00

## 2021-03-04 NOTE — Unmapped (Signed)
Hematology Resident (MEDE) Progress Note    Assessment & Plan:   Cristina Fields is a 70 y.o. female with CLL, ESRD on iHD, hypertension, prior cerebellar infarct that presented as a direct admission from infusion clinic with disorientation and pain. Found to have Strep Bovis bacteremia. She is currently finished with antibiotics and awaiting transfer to SNF.    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 *    Streptococcus bovis bacteremia (resolved): BCx 2 of 2 positive. Source possibly seeding from condyloma biopsy. Last colonoscopy at [facility] in 12/2018 with one polyp removed but was otherwise negative. ICID was consulted. Tunneled HD line was removed on 5/9. TEE negative for endocarditis. She completed 2 weeks of CTX (4/27-5/11).  -Will need outpatient referral to GI for colonoscopy at discharge.     CLL - Anemia - Thrombocytopenia - Lymphocytosis:  Oncology history:  Follows with Dr. Lonni Fix. Diagnosed ~2012 and received fludarabine + cyclophosphamide + rituximab x6. Relapsed in 05/2014 treated with bendamustine + rituximab. Relapsed again in 2018 and started ibrutinib in 5/201. Rapid relapse in 04/2020 and was treated with rituximab and chlorambucil through November, 2021. On zanubrutinib prior to admission.   - Resumed zanubrutinib   - BMBx performed 5/6 this admission to assess persistent anemia & thrombocytopenia showing hypercellularity and increased marrow cellularity compared to previous. However, this biopsy was done after zanubrutinib was stopped. Recommending repeat BMBx in a few months while on zanubrutinib.  -Transfuse for hgb <7, platelets <10,000    Encephalopathy: Now improved and near baseline. Worsening likely multifactorial in the setting of bacteremia per above, toxic-metabolic/uremia, and medication/pain driven superimposed on cognitive impairment at baseline (SLUMS 15 as outpatient).   -Delirium precautions    Left AC OA - Neck Pain: CT scan showed only non-specific inflammatory stranding in the fat surrounding the left shoulder and axilla.   -No further imaging indicated.   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 Seen by lower GI surgery this admission with no surgical needs at this time.  -WOCN  -Outpatient pap smear to assess for cervical HPV/malignancy  -Pain well controlled on current regimen.     ESRD on iHD - HTN: TThSat Dialysis. Continues to remain hypertensive despite up-titration of home meds while admitted. Blood pressure measurements confirmed to be taken while patient is sitting up in bed. Patient reported being on amlodipine 10mg  at home, but this was not continued during admission. Will restart at low dose and uptitrate to avoid hypotension.  -iHD while inpatient. Will transition to TTS in the outpatient setting.   -Coreg 25 mg BID; Nifedipine XL 90 mg daily; Hydralazine 50mg  q6h; Losartan 100mg  daily  -Amlo 2.5mg  started on 5/12  -Lasix 80 mg daily    Daily Checklist:  Diet: Regular Diet  DVT PPx: Heparin TID  Electrolytes: Replete Potassium to >/= 3.6 and Magnesium to >/= 1.8  Code Status: Full Code  Dispo: SNF on 5/16    Team Contact Information:   Primary Team: Hematology Resident (MEDE)  Primary Resident: Rosiland Oz, MD  Resident's Pager: [phone] (Hematology Intern -  White)    Interval History:   No acute events overnight. Patient reports feeling well this morning. Going to HD today. Planning for transfer to SNF on 5/16.     ROS otherwise negative.     Objective:   Temp:  [36.4 ??C-37 ??C] 36.4 ??C  Heart Rate:  [79-88] 86  Resp:  [18] 18  BP: (136-165)/(58-69) 165/65  SpO2:  [92 %-100 %] 99 %    General: Lying in bed comfortably in NAD.   Pulm: Normal work of breathing on RA. Lungs clear anteriorly.   CV: RRR.   Neuro: Alert, no focal deficits.  Psych: Mood and affect appropriate.     Labs/Studies: Labs and Studies from the last 24hrs per EMR and Reviewed    Rosiland Oz  Internal Medicine PGY2  Pager [phone]

## 2021-03-04 NOTE — Unmapped (Signed)
3:30 HR HD, goal 1.3L uf per Dr. Elson Clan, will monitor

## 2021-03-04 NOTE — Unmapped (Signed)
[facility] Nephrology Hemodialysis Procedure Note     03/04/2021    Cristina Fields was seen and examined on hemodialysis    CHIEF COMPLAINT: End Stage Renal Disease    INTERVAL HISTORY: AWAK    DIALYSIS TREATMENT DATA:  Estimated Dry Weight (kg): 47.5 kg (104 lb 11.5 oz)  Patient Goal Weight (kg): 1 kg (2 lb 3.3 oz)  Dialyzer: F-180 (98 mLs)  Dialysis Bath  Bath: 3 K+ / 2.5 Ca+  Dialysate Na (mEq/L): 137 mEq/L  Dialysate HCO3 (mEq/L): 35 mEq/L  Dialysate Total Buffer HCO3 (mEq/L): 35 mEq/L  Blood Flow Rate (mL/min): 400 mL/min  Dialysis Flow (mL/min): 800800 mL/min    PHYSICAL EXAM:  Vitals:  Temp:  [36.4 ??C (97.5 ??F)-37 ??C (98.6 ??F)] 36.4 ??C (97.5 ??F)  Heart Rate:  [78-88] 79  BP: (136-197)/(64-97) 197/97  MAP (mmHg):  [93-94] 93  Weights:  Pre-Treatment Weight (kg): 48.3 kg (106 lb 7.7 oz)    General: in no acute distress, currently dialyzing in a chair  Pulmonary: normal respiratory effort  Cardiovascular: regular rate and rhythm  Extremities: trace  edema  Access: LUE AV graft    LAB DATA:  Lab Results   Component Value Date    NA 134 (L) 03/02/2021    K 3.8 03/02/2021    CL 98 03/02/2021    CO2 26.0 03/02/2021    BUN 22 03/02/2021    CREATININE 4.33 (H) 03/02/2021    CALCIUM 8.9 03/02/2021    MG 1.8 03/04/2021    PHOS 4.8 03/04/2021    ALBUMIN 2.7 (L) 03/04/2021      Lab Results   Component Value Date    HCT 24.8 (L) 03/04/2021    WBC 29.7 (H) 03/04/2021        ASSESSMENT/PLAN:  End Stage Renal Disease on Intermittent Hemodialysis:  UF goal: 1.3 L as tolerated  Adjust medications for a GFR <10  Avoid nephrotoxic agents     Bone Mineral Metabolism:  Lab Results   Component Value Date    CALCIUM 8.9 03/02/2021    CALCIUM 8.7 03/01/2021    Lab Results   Component Value Date    ALBUMIN 2.7 (L) 03/04/2021    ALBUMIN 2.7 (L) 03/01/2021      Lab Results   Component Value Date    PHOS 4.8 03/04/2021    PHOS 3.9 03/03/2021    No results found for: PTH   Labs appropriate, no changes.    Anemia:   Lab Results   Component Value Date    HGB 7.8 (L) 03/04/2021    HGB 7.8 (L) 03/03/2021    HGB 7.9 (L) 03/02/2021    Iron Saturation (%)   Date Value Ref Range Status   02/13/2021 21 % Final      Lab Results   Component Value Date    FERRITIN 2,051.1 (H) 02/13/2021       Anemia labs appropriate, no changes.    Cristina Fields, MBBS  [place] [facility] Nephrology & Hypertension

## 2021-03-05 LAB — CBC W/ AUTO DIFF
BASOPHILS ABSOLUTE COUNT: 0.2 10*9/L — ABNORMAL HIGH (ref 0.0–0.1)
BASOPHILS RELATIVE PERCENT: 0.9 %
EOSINOPHILS ABSOLUTE COUNT: 0.3 10*9/L (ref 0.0–0.5)
EOSINOPHILS RELATIVE PERCENT: 1.3 %
HEMATOCRIT: 24 % — ABNORMAL LOW (ref 34.0–44.0)
HEMOGLOBIN: 7.7 g/dL — ABNORMAL LOW (ref 11.3–14.9)
LYMPHOCYTES ABSOLUTE COUNT: 18.9 10*9/L — ABNORMAL HIGH (ref 1.1–3.6)
LYMPHOCYTES RELATIVE PERCENT: 85.2 %
MEAN CORPUSCULAR HEMOGLOBIN CONC: 31.9 g/dL — ABNORMAL LOW (ref 32.0–36.0)
MEAN CORPUSCULAR HEMOGLOBIN: 27.9 pg (ref 25.9–32.4)
MEAN CORPUSCULAR VOLUME: 87.6 fL (ref 77.6–95.7)
MEAN PLATELET VOLUME: 9.8 fL (ref 6.8–10.7)
MONOCYTES ABSOLUTE COUNT: 0.4 10*9/L (ref 0.3–0.8)
MONOCYTES RELATIVE PERCENT: 1.9 %
NEUTROPHILS ABSOLUTE COUNT: 2.4 10*9/L (ref 1.8–7.8)
NEUTROPHILS RELATIVE PERCENT: 10.7 %
PLATELET COUNT: 66 10*9/L — ABNORMAL LOW (ref 150–450)
RED BLOOD CELL COUNT: 2.74 10*12/L — ABNORMAL LOW (ref 3.95–5.13)
RED CELL DISTRIBUTION WIDTH: 21.8 % — ABNORMAL HIGH (ref 12.2–15.2)
WBC ADJUSTED: 22.1 10*9/L — ABNORMAL HIGH (ref 3.6–11.2)

## 2021-03-05 LAB — BASIC METABOLIC PANEL
ANION GAP: 6 mmol/L (ref 5–14)
BLOOD UREA NITROGEN: 11 mg/dL (ref 9–23)
BUN / CREAT RATIO: 4
CALCIUM: 9.3 mg/dL (ref 8.7–10.4)
CHLORIDE: 100 mmol/L (ref 98–107)
CO2: 31 mmol/L (ref 20.0–31.0)
CREATININE: 2.88 mg/dL — ABNORMAL HIGH
EGFR CKD-EPI AA FEMALE: 18 mL/min/{1.73_m2} — ABNORMAL LOW (ref >=60)
EGFR CKD-EPI NON-AA FEMALE: 16 mL/min/{1.73_m2} — ABNORMAL LOW (ref >=60)
GLUCOSE RANDOM: 119 mg/dL (ref 70–179)
POTASSIUM: 3.9 mmol/L (ref 3.4–4.8)
SODIUM: 137 mmol/L (ref 135–145)

## 2021-03-05 LAB — PHOSPHORUS: PHOSPHORUS: 2.9 mg/dL (ref 2.4–5.1)

## 2021-03-05 LAB — SLIDE REVIEW

## 2021-03-05 LAB — MAGNESIUM: MAGNESIUM: 1.8 mg/dL (ref 1.6–2.6)

## 2021-03-05 MED ADMIN — oxyCODONE (ROXICODONE) immediate release tablet 2.5 mg: 2.5 mg | ORAL | @ 18:00:00 | Stop: 2021-03-06

## 2021-03-05 MED ADMIN — diclofenac sodium (VOLTAREN) 1 % gel 2 g: 2 g | TOPICAL | @ 22:00:00

## 2021-03-05 MED ADMIN — oxyCODONE (ROXICODONE) immediate release tablet 2.5 mg: 2.5 mg | ORAL | @ 22:00:00 | Stop: 2021-03-06

## 2021-03-05 MED ADMIN — acetaminophen (TYLENOL) tablet 1,000 mg: 1000 mg | ORAL | @ 18:00:00

## 2021-03-05 MED ADMIN — hydrALAZINE (APRESOLINE) tablet 50 mg: 50 mg | ORAL | @ 04:00:00

## 2021-03-05 MED ADMIN — acetaminophen (TYLENOL) tablet 1,000 mg: 1000 mg | ORAL | @ 10:00:00

## 2021-03-05 MED ADMIN — diclofenac sodium (VOLTAREN) 1 % gel 2 g: 2 g | TOPICAL | @ 10:00:00

## 2021-03-05 MED ADMIN — hydrALAZINE (APRESOLINE) tablet 50 mg: 50 mg | ORAL | @ 10:00:00

## 2021-03-05 MED ADMIN — hydrALAZINE (APRESOLINE) tablet 50 mg: 50 mg | ORAL | @ 16:00:00

## 2021-03-05 MED ADMIN — sertraline (ZOLOFT) tablet 100 mg: 100 mg | ORAL | @ 01:00:00

## 2021-03-05 MED ADMIN — NIFEdipine (PROCARDIA XL) 24 hr tablet 90 mg: 90 mg | ORAL | @ 14:00:00

## 2021-03-05 MED ADMIN — losartan (COZAAR) tablet 100 mg: 100 mg | ORAL | @ 13:00:00

## 2021-03-05 MED ADMIN — carvediloL (COREG) tablet 25 mg: 25 mg | ORAL | @ 13:00:00

## 2021-03-05 MED ADMIN — furosemide (LASIX) tablet 80 mg: 80 mg | ORAL | @ 13:00:00

## 2021-03-05 MED ADMIN — hydrALAZINE (APRESOLINE) tablet 50 mg: 50 mg | ORAL | @ 22:00:00

## 2021-03-05 MED ADMIN — amLODIPine (NORVASC) tablet 2.5 mg: 2.5 mg | ORAL | @ 13:00:00 | Stop: 2021-03-05

## 2021-03-05 MED ADMIN — diclofenac sodium (VOLTAREN) 1 % gel 2 g: 2 g | TOPICAL | @ 16:00:00

## 2021-03-05 MED ADMIN — atorvastatin (LIPITOR) tablet 40 mg: 40 mg | ORAL | @ 13:00:00

## 2021-03-05 NOTE — Unmapped (Signed)
Pt AxOx4, forgetful at times.  Afebrile, VSS, and free of falls/injuries this shift.  PRN given for neck/shoulder pain - see MAR.  Pt continues to have decreased appetite.  Pt had no other complaints this shift.  Continue to monitor.

## 2021-03-05 NOTE — Unmapped (Signed)
Pt appetite is very poor. Encouraged pt to drink  supplement shakes. VSS, afebrile overnight. Bed alarm on for safety. Call bell in reach.

## 2021-03-05 NOTE — Unmapped (Signed)
Hematology Resident (MEDE) Progress Note    Assessment & Plan:   Tanayah Squitieri is a 70 y.o. female with CLL, ESRD on iHD, hypertension, prior cerebellar infarct that presented as a direct admission from infusion clinic with disorientation and pain. Found to have Strep Bovis bacteremia. She is currently finished with antibiotics and awaiting transfer to SNF.    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 *    Streptococcus bovis bacteremia (resolved): BCx 2 of 2 positive. Source possibly seeding from condyloma biopsy. Last colonoscopy at [facility] in 12/2018 with one polyp removed but was otherwise negative. ICID was consulted. Tunneled HD line was removed on 5/9. TEE negative for endocarditis. She completed 2 weeks of CTX (4/27-5/11).  -Will need outpatient referral to GI for colonoscopy at discharge.     CLL - Anemia - Thrombocytopenia - Lymphocytosis:  Oncology history:  Follows with Dr. Lonni Fix. Diagnosed ~2012 and received fludarabine + cyclophosphamide + rituximab x6. Relapsed in 05/2014 treated with bendamustine + rituximab. Relapsed again in 2018 and started ibrutinib in 5/201. Rapid relapse in 04/2020 and was treated with rituximab and chlorambucil through November, 2021. On zanubrutinib prior to admission.   - Resumed zanubrutinib   - BMBx performed 5/6 this admission to assess persistent anemia & thrombocytopenia showing hypercellularity and increased marrow cellularity compared to previous. However, this biopsy was done after zanubrutinib was stopped. Recommending repeat BMBx in a few months while on zanubrutinib.  -Transfuse for hgb <7, platelets <10,000    Encephalopathy: Now improved and near baseline. Worsening likely multifactorial in the setting of bacteremia per above, toxic-metabolic/uremia, and medication/pain driven superimposed on cognitive impairment at baseline (SLUMS 15 as outpatient).   -Delirium precautions    Left AC OA - Neck Pain: CT scan showed only non-specific inflammatory stranding in the fat surrounding the left shoulder and axilla.   -No further imaging indicated.   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 Seen by lower GI surgery this admission with no surgical needs at this time.  -WOCN  -Outpatient pap smear to assess for cervical HPV/malignancy  -Pain well controlled on current regimen.     ESRD on iHD - HTN: TThSat Dialysis. Continues to remain hypertensive despite up-titration of home meds while admitted. Blood pressure measurements confirmed to be taken while patient is sitting up in bed. Patient reported being on amlodipine 10mg  at home, but this was not continued during admission. Will restart at low dose and uptitrate to avoid hypotension.  -iHD while inpatient. Will transition to TTS in the outpatient setting.   -Coreg 25 mg BID; Nifedipine XL 90 mg daily; Hydralazine 50mg  q6h; Losartan 100mg  daily  -Amlo 2.5mg  increased to 5mg  on 5/15  -Lasix 80 mg daily    Daily Checklist:  Diet: Regular Diet  DVT PPx: Heparin TID  Electrolytes: Replete Potassium to >/= 3.6 and Magnesium to >/= 1.8  Code Status: Full Code  Dispo: SNF on 5/16    Team Contact Information:   Primary Team: Hematology Resident (MEDE)  Primary Resident: Pearline Cables, MD  Resident's Pager: [phone] (Hematology  Intern - Cliffton Asters)    Interval History:   RN with concerns of expressive aphasia after dialysis yesterday evening. Patient hypertensive at the time (160/66). Bedside evaluation by OVN provider without concerns for expressive aphasia.    Planning for discharge to SNF on 5/16.    Objective:   Temp:  [36.1 ??C-37.2 ??C] 37.1 ??C  Heart Rate:  [75-95] 78  Resp:  [18] 18  BP: (151-216)/(54-97) 151/56  SpO2:  [95 %-100 %] 100 %    General: Lying in bed comfortably in NAD.   Pulm: Normal work of breathing on RA. Lungs clear anteriorly.   CV: RRR.   Neuro: Alert, no focal deficits.  Psych: Mood and affect appropriate.     Labs/Studies: Labs and Studies from the last 24hrs per EMR and Reviewed

## 2021-03-05 NOTE — Unmapped (Signed)
HEMODIALYSIS NURSE PROCEDURE NOTE    Treatment Number:  9 Room/Station:  6 Procedure Date:  03/04/21   Total Treatment Time:  221 Min.    CONSENT:  Written consent was obtained prior to the procedure and is detailed in the medical record. Prior to the start of the procedure, a time out was taken and the identity of the patient was confirmed via name, medical record number and date of birth.     WEIGHTS:  Hemodialysis Pre-Treatment Weights     Date/Time Pre-Treatment Weight (kg) Estimated Dry Weight (kg) Patient Goal Weight (kg) Total Goal Weight (kg)    03/04/21 1340 48.3 kg (106 lb 7.7 oz) 47.5 kg (104 lb 11.5 oz) 1 kg (2 lb 3.3 oz) 1.55 kg (3 lb 6.7 oz)           Hemodialysis Post Treatment Weights     Date/Time Post-Treatment Weight (kg) Treatment Weight Change (kg)    03/04/21 1743 46 kg (101 lb 6.6 oz) -2.3 kg        Active Dialysis Orders (168h ago, onward)     Start     Ordered    03/04/21 1412  Hemodialysis inpatient  Every Tue,Thu,Sat      Comments: RIJ IS NOT A DIALYSIS ACCESS   Question Answer Comment   K+ 3 meq/L    Ca++ 2.5 meq/L    Bicarb 35 meq/L    Na+ 137 meq/L    Na+ Modeling no    Dialyzer [id number]    Dialysate Temperature (C) 37    BFR-As tolerated to a maximum of: 500 mL/min    DFR 800 mL/min    Duration of treatment 3.5 Hr    Dry weight (kg) 47.5kg (new 5/10)    Challenge dry weight (kg) no    Fluid removal (L) 1 L 5/14    Tubing Adult = 142 ml    Access Site AVG    Access Site Location Left    Keep SBP >: 95        03/04/21 1411    02/28/21 1511  Hemodialysis inpatient  Every Tue,Thu,Sat,   Status:  Canceled      Comments: RIJ IS NOT A DIALYSIS ACCESS   Question Answer Comment   K+ 3 meq/L    Ca++ 2.5 meq/L    Bicarb 35 meq/L    Na+ 137 meq/L    Na+ Modeling no    Dialyzer [id number]    Dialysate Temperature (C) 37    BFR-As tolerated to a maximum of: 500 mL/min    DFR 800 mL/min    Duration of treatment 3.5 Hr    Dry weight (kg) 47.5kg (new 5/10)    Challenge dry weight (kg) no    Fluid removal (L) 1L 5/10    Tubing Adult = 142 ml    Access Site AVG    Access Site Location Left    Keep SBP >: 95        02/28/21 1510    02/23/21 1500  Hemodialysis inpatient  Every Tue,Thu,Sat,   Status:  Canceled      Comments: RIJ IS NOT A DIALYSIS ACCESS   Question Answer Comment   K+ 3 meq/L    Ca++ 2.5 meq/L    Bicarb 35 meq/L    Na+ 137 meq/L    Na+ Modeling no    Dialyzer [id number]    Dialysate Temperature (C) 37    BFR-As tolerated to a maximum of: 500 mL/min  DFR 800 mL/min    Duration of treatment 3.5 Hr    Dry weight (kg) 48.5 kg    Challenge dry weight (kg) no    Fluid removal (L) to edw    Tubing Adult = 142 ml    Access Site AVG    Access Site Location Left    Keep SBP >: 95        02/23/21 1459              ACCESS SITE:             Arteriovenous Fistula - Vein Graft  Access Arteriovenous vein graft Left;Upper Arm (Active)   Site Assessment Clean;Dry;Intact 03/04/21 1743   AV Fistula Thrill Present;Bruit Present 03/04/21 1743   Status Deaccessed 03/04/21 1743   Dressing Intervention New dressing 03/04/21 1743   Dressing Status      Clean;Dry;Intact/not removed 03/04/21 1743   Site Condition No complications 03/04/21 1743   Dressing Gauze 03/04/21 1743   Dressing To Be Removed (Date/Time) @ 2000 03/04/21 1743     Catheter Fill Volumes:  L       Patient Lines/Drains/Airways Status     Active Peripheral & Central Intravenous Access     Name Placement date Placement time Site Days    Peripheral IV 02/27/21 Posterior;Right Forearm 02/27/21  0126  Forearm  5              LAB RESULTS:  Lab Results   Component Value Date    NA 134 (L) 03/02/2021    K 3.8 03/02/2021    CL 98 03/02/2021    CO2 26.0 03/02/2021    BUN 22 03/02/2021    CREATININE 4.33 (H) 03/02/2021    GLU 182 (H) 03/02/2021    CALCIUM 8.9 03/02/2021    PHOS 4.8 03/04/2021    MG 1.8 03/04/2021    IRON 35 (L) 02/13/2021    LABIRON 21 02/13/2021    FERRITIN 2,051.1 (H) 02/13/2021    TIBC 163 (L) 02/13/2021     Lab Results   Component Value Date    WBC 18.2 (H) 03/04/2021    HGB 8.1 (L) 03/04/2021    HCT 25.4 (L) 03/04/2021    PLT 66 (L) 03/04/2021    APTT 33.0 02/15/2021        VITAL SIGNS:  Temperature     Date/Time Temp Temp src       03/04/21 1917 37 ??C (98.6 ??F) Oral     03/04/21 1828 36.6 ??C (97.9 ??F) Oral     03/04/21 1743 36.1 ??C (97 ??F) Oral         Hemodynamics     Date/Time Pulse BP MAP (mmHg) Patient Position    03/04/21 1917 87 160/66 ??? Lying    03/04/21 1858 90 182/62 98 Lying    03/04/21 1828 95 216/76 117 Lying    03/04/21 1743 90 199/95 ??? Sitting    03/04/21 1730 87 190/84 ??? Sitting    03/04/21 1700 84 177/88 ??? Sitting        Blood Volume Monitor     Date/Time Blood Volume Change (%) HCT HGB Critline O2 SAT %    03/04/21 1743 0.5 % 5 1.7 100        Oxygen Therapy     Date/Time Resp SpO2 O2 Device FiO2 (%) O2 Flow Rate (L/min)    03/04/21 1917 18 97 % ??? -- ???    03/04/21 1828 18 95 % None (Room  air) -- ???    03/04/21 1743 18 ??? ??? -- ???    03/04/21 1730 18 ??? ??? -- ???    03/04/21 1700 18 ??? ??? -- ???        Oxygen Connected to Wall:  n/a    Pre-Hemodialysis Assessment     Date/Time Therapy Number Dialyzer All Research scientist (physical sciences) Detector Dialysis Flow (mL/min)    03/04/21 1340 9 F-180 (98 mLs) Yes Engaged 800800 mL/min    Date/Time Verify Priming Solution Priming Volume Hemodialysis Independent pH Hemodialysis Machine Conductivity (mS/cm) Hemodialysis Independent Conductivity (mS/cm)    03/04/21 1340 0.9% NS 300 mL ???  na 13.7 mS/cm 13.8 mS/cm    Date/Time Bicarb Conductivity Residual Bleach Negative Free Chlorine Total Chlorine Chloramine    03/04/21 1340 -- Yes -- 0 --        Pre-Hemodialysis Treatment Comments     Date/Time Pre-Hemodialysis Comments    03/04/21 1340 alert & stable        Hemodialysis Treatment     Date/Time Blood Flow Rate (mL/min) Arterial Pressure (mmHg) Venous Pressure (mmHg) Transmembrane Pressure (mmHg)    03/04/21 1743 400 mL/min -147 mmHg 240 mmHg 61 mmHg    03/04/21 1730 400 mL/min -136 mmHg 248 mmHg 56 mmHg    03/04/21 1700 400 mL/min -135 mmHg 246 mmHg 53 mmHg    03/04/21 1630 400 mL/min -124 mmHg 249 mmHg 49 mmHg    03/04/21 1600 400 mL/min -120 mmHg 240 mmHg 60 mmHg    03/04/21 1530 400 mL/min -136 mmHg 249 mmHg 47 mmHg    03/04/21 1525 400 mL/min -148 mmHg 254 mmHg 27 mmHg    03/04/21 1500 400 mL/min -130 mmHg 250 mmHg ???    03/04/21 1430 400 mL/min -130 mmHg 250 mmHg 50 mmHg    03/04/21 1402 400 mL/min -130 mmHg 250 mmHg 50 mmHg    Date/Time Ultrafiltration Rate (mL/hr) Ultrafiltrate Removed (mL) Dialysate Flow Rate (mL/min) KECN (Kecn)    03/04/21 1743 900 mL/hr 1850 mL 800 ml/min ???    03/04/21 1730 900 mL/hr 1636 mL 800 ml/min ???    03/04/21 1700 590 mL/hr 1374 mL 800 ml/min ???    03/04/21 1630 590 mL/hr 1075 mL 800 ml/min ???    03/04/21 1600 590 mL/hr 809 mL 800 ml/min ???    03/04/21 1530 440 mL/hr 577 mL 800 ml/min ???    03/04/21 1525 0 mL/hr 547 mL 800 ml/min ???    03/04/21 1500 440 mL/hr 250 mL 800 ml/min ???    03/04/21 1430 440 mL/hr 150 mL 800 ml/min ???  na    03/04/21 1402 440 mL/hr 0 mL 800 ml/min ???  na        Hemodialysis Treatment Comments     Date/Time Intra-Hemodialysis Comments    03/04/21 1743 rinse back    03/04/21 1730 pt. resting    03/04/21 1700 pt. resting    03/04/21 1630 resting    03/04/21 1600 seen by Renal Dr. Elson Clan UF net 1.3L    03/04/21 1530 pt. watching tv    03/04/21 1500 pt. watching tv    03/04/21 1430 pt. watching tv    03/04/21 1402 start HD        Post Treatment     Date/Time Rinseback Volume (mL) On Line Clearance: spKt/V Total Liters Processed (L/min) Dialyzer Clearance    03/04/21 1743 300 mL 1.9 spKt/V 76.3 L/min Lightly streaked          Post  Hemodialysis Treatment Comments     Date/Time Post-Hemodialysis Comments    03/04/21 1743 alert & stable        POST TREATMENT ASSESSMENT:  General appearance:  alert  Neurological:  Alert and oriented X 3, normal strength and tone. Normal symmetric reflexes. Normal coordination and gait  Lungs:  diminished breath sounds anterior - bilateral  Hearts:  regular rate and rhythm, S1, S2 normal, no murmur, click, rub or gallop  Abdomen:  soft, non-tender; bowel sounds normal; no masses,  no organomegaly  Pulses:  2+ and symmetric.  Skin:  Skin color, texture, turgor normal. No rashes or lesions    Hemodialysis I/O     Date/Time Total Hemodialysis Replacement Volume (mL) Total Ultrafiltrate Output (mL)    03/04/21 1743 ??? 1300 mL        [id number] - Medicaitons Given During Treatment  (last 4 hrs)         RACHELLE S STICKEL, RN       Medication Name Action Time Action Route Rate Dose User     NIFEdipine (PROCARDIA XL) 24 hr tablet 90 mg 03/04/21 1836 Given Oral  90 mg Oretha Ellis, RN     acetaminophen (TYLENOL) tablet 1,000 mg 03/04/21 1841 Given Oral  1,000 mg Oretha Ellis, RN     amLODIPine (NORVASC) tablet 2.5 mg 03/04/21 1836 Given Oral  2.5 mg Oretha Ellis, RN     atorvastatin (LIPITOR) tablet 40 mg 03/04/21 1836 Given Oral  40 mg Oretha Ellis, RN     carvediloL (COREG) tablet 25 mg 03/04/21 1836 Given Oral  25 mg Oretha Ellis, RN     diclofenac sodium (VOLTAREN) 1 % gel 2 g 03/04/21 1836 Given Topical  2 g Oretha Ellis, RN     furosemide (LASIX) tablet 80 mg 03/04/21 1836 Given Oral  80 mg Oretha Ellis, RN     hydrALAZINE (APRESOLINE) tablet 50 mg 03/04/21 1836 Given Oral  50 mg Oretha Ellis, RN     lidocaine (LIDODERM) 5 % patch 2 patch 03/04/21 1837 Patch Removed Transdermal   Oretha Ellis, RN     losartan (COZAAR) tablet 100 mg 03/04/21 1836 Given Oral  100 mg Oretha Ellis, RN     melatonin tablet 3 mg 03/04/21 1836 Given Oral  3 mg Oretha Ellis, RN     zanubrutinib (BRUKINSA) capsule 160 mg 03/04/21 1840 Given Oral  160 mg Oretha Ellis, RN          Tymara Saur P Camdon Saetern, RN       Medication Name Action Time Action Route Rate Dose User     epoetin alfa-EPBx (RETACRIT) injection 4,000 Units 03/04/21 1633 Given Intravenous  4,000 Units Ceasia Elwell P Alicya Bena, RN     paricalcitoL (ZEMPLAR) injection 5 mcg 03/04/21 1633 Given Intravenous  5 mcg Javarion Douty P Faylene Allerton, RN                  Patient tolerated treatment in a  Dialysis Recliner.

## 2021-03-06 LAB — BASIC METABOLIC PANEL
ANION GAP: 7 mmol/L (ref 5–14)
BLOOD UREA NITROGEN: 22 mg/dL (ref 9–23)
BUN / CREAT RATIO: 5
CALCIUM: 9.6 mg/dL (ref 8.7–10.4)
CHLORIDE: 100 mmol/L (ref 98–107)
CO2: 29 mmol/L (ref 20.0–31.0)
CREATININE: 4.04 mg/dL — ABNORMAL HIGH
EGFR CKD-EPI AA FEMALE: 12 mL/min/{1.73_m2} — ABNORMAL LOW (ref >=60)
EGFR CKD-EPI NON-AA FEMALE: 11 mL/min/{1.73_m2} — ABNORMAL LOW (ref >=60)
GLUCOSE RANDOM: 156 mg/dL (ref 70–179)
POTASSIUM: 3.8 mmol/L (ref 3.4–4.8)
SODIUM: 136 mmol/L (ref 135–145)

## 2021-03-06 LAB — PHOSPHORUS: PHOSPHORUS: 4.1 mg/dL (ref 2.4–5.1)

## 2021-03-06 LAB — CBC W/ AUTO DIFF
BASOPHILS ABSOLUTE COUNT: 0.1 10*9/L (ref 0.0–0.1)
BASOPHILS RELATIVE PERCENT: 0.5 %
EOSINOPHILS ABSOLUTE COUNT: 0.4 10*9/L (ref 0.0–0.5)
EOSINOPHILS RELATIVE PERCENT: 1.6 %
HEMATOCRIT: 24.5 % — ABNORMAL LOW (ref 34.0–44.0)
HEMOGLOBIN: 7.8 g/dL — ABNORMAL LOW (ref 11.3–14.9)
LYMPHOCYTES ABSOLUTE COUNT: 18.7 10*9/L — ABNORMAL HIGH (ref 1.1–3.6)
LYMPHOCYTES RELATIVE PERCENT: 83 %
MEAN CORPUSCULAR HEMOGLOBIN CONC: 32 g/dL (ref 32.0–36.0)
MEAN CORPUSCULAR HEMOGLOBIN: 28.2 pg (ref 25.9–32.4)
MEAN CORPUSCULAR VOLUME: 88.1 fL (ref 77.6–95.7)
MEAN PLATELET VOLUME: 9.9 fL (ref 6.8–10.7)
MONOCYTES ABSOLUTE COUNT: 0.7 10*9/L (ref 0.3–0.8)
MONOCYTES RELATIVE PERCENT: 3 %
NEUTROPHILS ABSOLUTE COUNT: 2.7 10*9/L (ref 1.8–7.8)
NEUTROPHILS RELATIVE PERCENT: 11.9 %
PLATELET COUNT: 60 10*9/L — ABNORMAL LOW (ref 150–450)
RED BLOOD CELL COUNT: 2.78 10*12/L — ABNORMAL LOW (ref 3.95–5.13)
RED CELL DISTRIBUTION WIDTH: 22.7 % — ABNORMAL HIGH (ref 12.2–15.2)
WBC ADJUSTED: 22.6 10*9/L — ABNORMAL HIGH (ref 3.6–11.2)

## 2021-03-06 LAB — MAGNESIUM: MAGNESIUM: 1.8 mg/dL (ref 1.6–2.6)

## 2021-03-06 MED ORDER — DICLOFENAC 1 % TOPICAL GEL
Freq: Four times a day (QID) | TOPICAL | 0 refills | 57 days
Start: 2021-03-06 — End: 2021-05-01

## 2021-03-06 MED ORDER — FUROSEMIDE 80 MG TABLET
ORAL_TABLET | Freq: Every day | ORAL | 0 refills | 30 days
Start: 2021-03-06 — End: 2021-04-05

## 2021-03-06 MED ORDER — POLYETHYLENE GLYCOL 3350 17 GRAM ORAL POWDER PACKET
Freq: Every day | ORAL | 0 refills | 0 days | PRN
Start: 2021-03-06 — End: 2021-04-05

## 2021-03-06 MED ORDER — CARVEDILOL 25 MG TABLET
ORAL_TABLET | Freq: Two times a day (BID) | ORAL | 0 refills | 30 days
Start: 2021-03-06 — End: 2021-04-05

## 2021-03-06 MED ORDER — MELATONIN 3 MG TABLET
Freq: Every evening | ORAL | 0 refills | 0 days
Start: 2021-03-06 — End: ?

## 2021-03-06 MED ORDER — AMLODIPINE 5 MG TABLET
ORAL_TABLET | Freq: Every day | ORAL | 3 refills | 90.00000 days
Start: 2021-03-06 — End: 2022-03-06

## 2021-03-06 MED ORDER — HYDRALAZINE 50 MG TABLET
ORAL_TABLET | Freq: Four times a day (QID) | ORAL | 0 refills | 30 days
Start: 2021-03-06 — End: 2021-04-05

## 2021-03-06 MED ADMIN — acetaminophen (TYLENOL) tablet 1,000 mg: 1000 mg | ORAL | @ 19:00:00

## 2021-03-06 MED ADMIN — hydrALAZINE (APRESOLINE) tablet 50 mg: 50 mg | ORAL | @ 04:00:00

## 2021-03-06 MED ADMIN — hydrALAZINE (APRESOLINE) tablet 50 mg: 50 mg | ORAL | @ 16:00:00

## 2021-03-06 MED ADMIN — hydrALAZINE (APRESOLINE) tablet 50 mg: 50 mg | ORAL | @ 10:00:00

## 2021-03-06 MED ADMIN — melatonin tablet 3 mg: 3 mg | ORAL | @ 01:00:00

## 2021-03-06 MED ADMIN — diclofenac sodium (VOLTAREN) 1 % gel 2 g: 2 g | TOPICAL | @ 01:00:00

## 2021-03-06 MED ADMIN — sertraline (ZOLOFT) tablet 100 mg: 100 mg | ORAL | @ 01:00:00

## 2021-03-06 MED ADMIN — carvediloL (COREG) tablet 25 mg: 25 mg | ORAL | @ 01:00:00

## 2021-03-06 MED ADMIN — carvediloL (COREG) tablet 25 mg: 25 mg | ORAL | @ 13:00:00

## 2021-03-06 MED ADMIN — oxyCODONE (ROXICODONE) immediate release tablet 2.5 mg: 2.5 mg | ORAL | @ 10:00:00 | Stop: 2021-03-06

## 2021-03-06 MED ADMIN — hydrALAZINE (APRESOLINE) tablet 50 mg: 50 mg | ORAL | @ 21:00:00

## 2021-03-06 MED ADMIN — NIFEdipine (PROCARDIA XL) 24 hr tablet 90 mg: 90 mg | ORAL | @ 13:00:00

## 2021-03-06 MED ADMIN — atorvastatin (LIPITOR) tablet 40 mg: 40 mg | ORAL | @ 13:00:00

## 2021-03-06 MED ADMIN — furosemide (LASIX) tablet 80 mg: 80 mg | ORAL | @ 13:00:00

## 2021-03-06 MED ADMIN — amLODIPine (NORVASC) tablet 5 mg: 5 mg | ORAL | @ 13:00:00

## 2021-03-06 MED ADMIN — diclofenac sodium (VOLTAREN) 1 % gel 2 g: 2 g | TOPICAL | @ 10:00:00

## 2021-03-06 MED ADMIN — acetaminophen (TYLENOL) tablet 1,000 mg: 1000 mg | ORAL | @ 04:00:00

## 2021-03-06 MED ADMIN — acetaminophen (TYLENOL) tablet 1,000 mg: 1000 mg | ORAL | @ 10:00:00

## 2021-03-06 MED ADMIN — losartan (COZAAR) tablet 100 mg: 100 mg | ORAL | @ 13:00:00

## 2021-03-06 MED ADMIN — lidocaine (LIDODERM) 5 % patch 2 patch: 2 | TRANSDERMAL | @ 04:00:00

## 2021-03-06 NOTE — Unmapped (Signed)
[facility] Destination - Admitted Since 02/13/2021     Service Provider Selected Services Address Phone Fax Patient Preferred    [facility] AND [facility]  Skilled Nursing [address] [place] [address] [phone]       [facility] - Admitted Since 02/13/2021     Service Provider Selected Services Address Phone Fax Patient Preferred    [facility]  Ambulances [address], [place] [address] [phone] ???

## 2021-03-06 NOTE — Unmapped (Signed)
Physician Discharge Summary [facility]  [facility] ONC [facility]  [address]  [place] [address]  Dept: [phone]  Loc: [phone]     Identifying Information:   Cristina Fields  12/06/1950  [id number]    Primary Care Physician: Irma Newness, MD     Referring Physician: Lenon Ahmadi     Code Status: Full Code    Admit Date: 02/13/2021    Discharge Date: 03/07/2021     Discharge To: Skilled nursing facility    Discharge Service: [facility] - Hematology Res Floor Team (MEDE)     Discharge Attending Physician: Halford Decamp, MD    Discharge Diagnoses:  Principal Problem:    Streptococcus bovis bacteremia  Active Problems:    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 *      Outpatient Provider Follow Up Issues:   Follow-up Plan after discharge:  1. Issues related to hospitalization:  a. Repeat bone marrow biopsy for persistent anemia & thrombocytopenia while on zanubrutinib  b. Further titrate anti-hypertensives. Amlodipine can be uptitrated from 5mg  dose.  c. Pap smear & HPV testing for evaluation of cervical cancer in the setting of extensive rectal condyloma  d. Colonoscopy for evaluation of gastrointestinal cancer in the setting of Strep bovis bacteremia  2. Follow-up appointment for lab draws: 03/08/21 @ 10:30am  3. Follow-up appointment with [facility]: 03/08/21 @ 11:30am with Lenon Ahmadi, AGNP  4. Oncology specific plans going forward:   Repeat bone marrow biopsy for persistent anemia & thrombocytopenia while on zanubrutinib      Patient's primary oncologist and/or nurse navigator: Pernell Dupre MD & Lenon Ahmadi, AGNP   Warm handoff via Epic message or direct conversation?: Yes.    Supportive Care Recommendations:  We recommend based on the patient???s underlying diagnosis and treatment history the following supportive care:    1. Antimicrobial prophylaxis:  None    2. Blood product support:  Leukoreduced blood products are required.  Irradiated blood products are preferred, but in case of urgent transfusion needs non-irradiated blood products may be used:     -  No special recommendations.  Follow internal standard practices.        3. Hematopoietic growth factor support: none    Hospital Course:   Outpatient Follow Up Needs:  [ ]  Referral placed to [facility] for consideration of repeat Colonoscopy given Strep Bovis Bacteremia. Last Colonoscopy in 2020 with single polyp, but likely deserves repeat in setting of Strep Bovis association with GI malignancy.   [ ]  Referral placed to [facility] given extensive Rectal Condyloma. Patient should have a pap smear and HPV testing to assess for any cervical malignancy.   [ ]  Home anti-hypertensives were up-titrated while admitted, but patient continues to be hypertensive to the 150-160's systolic. Consider if uptitration of amlodipine is indicated.   [ ]  Repeat bone marrow biopsy outpatient to determine if persistent anemia & thrombocytopenia are due to failure on zanubrutinib.    Hospital Course:   Cristina Fields is a 70yo woman with a history of CLL (on Zanubrutinib), ESRD on iHD, HTN, prior cerebellar stroke, cognitive impairment, who presented to [facility] with AMS ultimately found to have Strep Bovis Bacteremia. Hospital course detailed by problem  below:    Strep Bovis Bacteremia: Patient presented with altered mental status and significant encephalopathy (see below) as well as increased pain in her significant rectal condyloma. Recently had undergone EUA and biopsy of condyloma prior to admission so initially thought biopsy site could be infected so she was covered with Ciprofloxacin and Metronidazole initially. Remained afebrile but was eventually found to have 2/2 blood cultures positive for Strep Bovis which is felt to be the driver of her encephalopathy. Source of Strep Bovis unclear, but potentially could be related to recent biopsy of rectal condyloma. Colonoscopy in 2020 at [facility] essentially normal with a benign polyp removed. Immunocompromised ID was consulted and she was started on Ceftriaxone and completed a two week total course. TEE negative for endocarditis. Her HD tunneled line was removed by VIR this admission (dialyzing through L UE fistula). 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     Grade 3 Encephalopathy: Presented with grade 3 encephalopathy. Initially there was some concern for intracranial process. Head CT on admission with evidence of prior cerebellar infarct. When mental status did not continue to improve, brain MRI was pursued which on first read showed signs of possible acute SAH. Neurosurgery was consulted overnight who felt low likelihood of SAH given reassuring neurologic exam. MRI ultimately over-read by attending with low concern for acute bleed. MRA performed which showed no aneurysm or evidence of bleed. Ultimately patient's encephalopathy determined to be driven largely by bacteremia. Also additional multi-factorial etiologies contributed as well with component of toxic-metabolic/uremic in setting of CKD, medication effects (increased opioid use at home prior to admission), and superimposed acute illness delirium from bacteremia on baseline cognitive impairment/dementia (SLUMS 15 in outpatient setting). Patient's mental status returned to baseline per her husband with treatment of her bacteremia and supportive care.     CLL - Anemia - Thrombocytopenia - Leukocytosis: Follows with Dr. Lonni Fix at [facility]. Diagnosed ~2012 and received fludarabine + cyclophosphamide + rituximab x6. Relapsed in 05/2014 treated with bendamustine + rituximab. Relapsed again in 2018 and started ibrutinib in 5/201. Rapid relapse in 04/2020 and was treated with rituximab and chlorambucil through November, 2021. On zanubrutinib prior to admission. Zanubrutinib held on admission initially given possible procedures and its platelet inhibition effects. Resumed this admission and plans to continue this on discharge. Counts remained stable without need for transfusion. Underwent bone marrow biopsy this admission on 5/6 for persistent anemia and thrombocytopenia. However, he demonstrated hypercellularity of her bone marrow biopsy may have been a physiologic response rather than failure on zanubrutinib as it was obtained while her peripheral absolute lymphocyte count was at its lowest. Recommended to repeat bone marrow biopsy at a later time outpatient.    Left AC OA - Neck Pain: Patient reporting consistent left sided neck, clavicular, and shoulder pain this admission. Given bacteremia and c/f infection in an immunocompromised host, ICID recommended further imaging. CT of the neck and shoulder with non-specific inflammatory stranding of left shoulder and axilla with no evidence of infectious causes. Clavicle film with osteoarthritis. Managed with pain control with lidocaine patches, prn oxycodone, and voltaren gel.     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 Initial concern on admission that her biopsy site could be infected leading to her AMS, however was evaluated by lower GI surgery who felt biopsy site healing appropriately without evidence of infection.  No surgical interventions were felt to be necessary at this time and no other imaging tests were warranted per lower GI surgery. Wound care nursing evaluated patient and provided dressing recs. At discharge, she was referred to [facility] in order to consider pap smear and HPV testing to assess for any cervical involvement. This could also be undertaken by her PCP as well but should be performed if possible.     ESRD on iHD (TThSat): Patient was provided dialysis while admitted through LUE AVF. Her tunneled HD line, which was no longer being used for dialysis was removed by VIR this admission on 5/9. She was noted to have significant HTN that was difficult to control despite up-titrating her home anti-hypertensives. At discharge she was on maximum doses of Nifedipine, Losartan, Hydralazine, and Coreg. She was also re-started on home Amlodipine at 5mg . Discharged with instructions to PCP to further optimize in outpatient setting (can further increase amlodipine).        Procedures:  dialysis  No admission procedures for hospital encounter.  ______________________________________________________________________  Discharge Medications:     Your Medication List      STOP taking these medications    hydrocortisone 1 % cream     HYDROmorphone 2 MG tablet  Commonly known as: DILAUDID     lidocaine 5 % ointment  Commonly known as: XYLOCAINE  Replaced by: lidocaine 5 % patch     MIRCERA INJ     NIFEdipine 60 MG 24 hr tablet  Commonly known as: ADALAT CC  Replaced by: NIFEdipine 90 MG 24 hr tablet     ondansetron 8 MG tablet  Commonly known as: ZOFRAN     polyethylene glycol 17 gram/dose powder  Commonly known as: GLYCOLAX  Replaced by: polyethylene glycol 17 gram packet     zinc oxide 20 % ointment        START taking these medications    diclofenac sodium 1 % gel  Commonly known as: VOLTAREN  Apply 2 g topically four (4) times a day.     furosemide 80 MG tablet  Commonly known as: LASIX  Take 1 tablet (80 mg total) by mouth daily.     lidocaine 5 % patch  Commonly known as: LIDODERM  Place 2 patches on the skin daily. Apply to affected area for 12 hours only each day (then remove patch)  Replaces: lidocaine 5 % ointment     melatonin 3 mg Tab  Take 1 tablet (3 mg total) by mouth every evening.     NIFEdipine 90 MG 24 hr tablet  Commonly known as: PROCARDIA XL  Take 1 tablet (90 mg total) by mouth daily.  Replaces: NIFEdipine 60 MG 24 hr tablet     polyethylene glycol 17 gram packet  Commonly known as: MIRALAX  Take 17 g by mouth daily as needed (constipation).  Replaces: polyethylene glycol 17 gram/dose powder        CHANGE how you take these medications    amLODIPine 5 MG tablet  Commonly known as: NORVASC  Take 1 tablet (5 mg total) by mouth daily.  What changed:   ?? medication strength  ?? how much to take     carvediloL 25 MG tablet  Commonly known as: COREG  Take 1 tablet (25 mg total) by mouth Two (2) times a day.  What changed:   ?? medication strength  ?? how much to take     hydrALAZINE 50 MG tablet  Commonly known as: APRESOLINE  Take  1 tablet (50 mg total) by mouth every six (6) hours.  What changed:   ?? medication strength  ?? how much to take  ?? when to take this        CONTINUE taking these medications    acetaminophen 500 MG tablet  Commonly known as: TYLENOL  Take 2 tablets (1,000 mg total) by mouth every eight (8) hours.     atorvastatin 40 MG tablet  Commonly known as: LIPITOR  Take 40 mg by mouth daily.     BRUKINSA capsule  Generic drug: zanubrutinib  Take 2 capsules (160mg ) by mouth once daily three times a week after dialysis. Swallow whole with water. Do not open, break, or chew capsule.     doxercalciferoL 2 mcg/mL Soln  Infuse into a venous catheter. Pt taking at dialysis-unknown dosage     loratadine 10 mg tablet  Commonly known as: CLARITIN  Take 1 tablet (10 mg total) by mouth daily.     losartan 100 MG tablet  Commonly known as: COZAAR  Take 100 mg by mouth daily.     oxyCODONE 5 MG immediate release tablet  Commonly known as: ROXICODONE  Take 5 mg by mouth every four (4) hours as needed for pain.     sertraline 100 MG tablet  Commonly known as: ZOLOFT  Take 100 mg by mouth daily.            Allergies:  Lisinopril  ______________________________________________________________________  Pending Test Results (if blank, then none):  Pending Labs     Order Current Status [employer] AP Order In process          Most Recent Labs:  All lab results last 24 hours -   Recent Results (from the past 24 hour(s))   COVID-19 PCR    Collection Time: 03/06/21  5:13 PM    Specimen: Nasopharyngeal Swab   Result Value Ref Range    SARS-CoV-2 PCR Not Detected Not Detected   POCT Glucose    Collection Time: 03/06/21  5:38 PM   Result Value Ref Range    Glucose, POC 109 70 - 179 mg/dL   POCT Glucose    Collection Time: 03/06/21  8:46 PM   Result Value Ref Range    Glucose, POC 112 70 - 179 mg/dL   POCT Glucose    Collection Time: 03/07/21  7:39 AM   Result Value Ref Range    Glucose, POC 152 70 - 179 mg/dL   Basic Metabolic Panel    Collection Time: 03/07/21  9:29 AM   Result Value Ref Range    Sodium 136 135 - 145 mmol/L    Potassium 3.9 3.4 - 4.8 mmol/L    Chloride 98 98 - 107 mmol/L    CO2 27.0 20.0 - 31.0 mmol/L    Anion Gap 11 5 - 14 mmol/L    BUN 32 (H) 9 - 23 mg/dL    Creatinine 1.47 (H) 0.60 - 0.80 mg/dL    BUN/Creatinine Ratio 6     EGFR CKD-EPI Non-African American, Female 7 (L) >=60 mL/min/1.65m2    EGFR CKD-EPI African American, Female 8 (L) >=60 mL/min/1.38m2    Glucose 217 (H) 70 - 179 mg/dL    Calcium 9.7 8.7 - 82.9 mg/dL   Magnesium Level    Collection Time: 03/07/21  9:29 AM   Result Value Ref Range    Magnesium 1.9 1.6 - 2.6 mg/dL   Phosphorus Level    Collection Time: 03/07/21  9:29 AM  Result Value Ref Range    Phosphorus 5.2 (H) 2.4 - 5.1 mg/dL   CBC w/ Differential    Collection Time: 03/07/21  9:29 AM   Result Value Ref Range    WBC 20.2 (H) 3.6 - 11.2 10*9/L    RBC 3.08 (L) 3.95 - 5.13 10*12/L    HGB 8.7 (L) 11.3 - 14.9 g/dL    HCT 24.4 (L) 01.0 - 44.0 %    MCV 87.9 77.6 - 95.7 fL    MCH 28.3 25.9 - 32.4 pg    MCHC 32.2 32.0 - 36.0 g/dL    RDW 27.2 (H) 53.6 - 15.2 %    MPV 10.1 6.8 - 10.7 fL    Platelet 74 (L) 150 - 450 10*9/L    Neutrophils % 15.9 %    Lymphocytes % 79.3 %    Monocytes % 2.5 %    Eosinophils % 1.7 %    Basophils % 0.6 %    Absolute Neutrophils 3.2 1.8 - 7.8 10*9/L    Absolute Lymphocytes 16.0 (H) 1.1 - 3.6 10*9/L    Absolute Monocytes 0.5 0.3 - 0.8 10*9/L    Absolute Eosinophils 0.3 0.0 - 0.5 10*9/L    Absolute Basophils 0.1 0.0 - 0.1 10*9/L    Anisocytosis Marked (A) Not Present   Hepatic Function Panel    Collection Time: 03/07/21  9:29 AM   Result Value Ref Range    Albumin 2.8 (L) 3.4 - 5.0 g/dL    Total Protein 5.3 (L) 5.7 - 8.2 g/dL    Total Bilirubin 0.3 0.3 - 1.2 mg/dL    Bilirubin, Direct 6.44 0.00 - 0.30 mg/dL    AST 26 <=03 U/L    ALT 15 10 - 49 U/L    Alkaline Phosphatase 126 (H) 46 - 116 U/L       Relevant Studies/Radiology (if blank, then none):  ECG 12 Lead    Result Date: 02/14/2021  NORMAL SINUS RHYTHM POSSIBLE LEFT ATRIAL ENLARGEMENT LEFT VENTRICULAR HYPERTROPHY ANTERIOR INFARCT  (CITED ON OR BEFORE 13-Feb-2021) ABNORMAL ECG WHEN COMPARED WITH ECG OF 26-Jan-2021 17:00, NO SIGNIFICANT CHANGE WAS FOUND Confirmed by Warnell Forester ([id number]) on 02/14/2021 9:02:55 PM    XR Chest Portable    Result Date: 02/14/2021  EXAM: XR CHEST PORTABLE DATE: 02/13/2021 8:10 PM ACCESSION: [id number] UN DICTATED: 02/13/2021 8:29 PM [facility]PRETATION LOCATION: [facility] CLINICAL INDICATION: 70 years old Female with CHEST PAIN  COMPARISON: PET/CT 09/14/2020 TECHNIQUE: Portable Chest Radiograph. FINDINGS: Right IJ central catheter terminates over the right atrium. The lungs are mildly hypoinflated. Left basilar atelectasis. Small left pleural effusion. No pneumothorax. Normal cardiomediastinal silhouette.     Small left pleural effusion with likely adjacent atelectasis. ==================== ADDENDUM (02/14/2021 12:48 AM): On review, the following additional findings were noted: -Question mild central pulmonary vascular congestion. -Left costophrenic opacity is somewhat nodular in appearance. Considerations include loculated fluid, rounded atelectasis, or nodule. CT chest could be considered for further evaluation as clinically indicated    XR Clavicle Left    Result Date: 02/15/2021  EXAM: XR CLAVICLE LEFT DATE: 02/15/2021 1:41 PM ACCESSION: [id number] UN DICTATED: 02/15/2021 1:52 PM INTERPRETATION LOCATION: [facility] CLINICAL INDICATION: 70 years old Female with Pain over clavicle  COMPARISON: None. TECHNIQUE: AP and axial views of the left clavicle. FINDINGS: No acute fracture. The sternoclavicular and acromioclavicular joints are approximated. Moderate acromioclavicular joint space narrowing with osteophytosis. No soft tissue swelling. The partially evaluated lungs are clear.     Moderate left acromioclavicular osteoarthrosis.  CT Head Wo Contrast    Result Date: 02/14/2021  EXAM: Computed tomography, head or brain without contrast material. DATE: 02/14/2021 12:10 AM ACCESSION: [id number] UN DICTATED: 02/14/2021 12:23 AM INTERPRETATION LOCATION: [facility] CLINICAL INDICATION: 70 years old Female with AMS on zanubrutinib, evaluate for intracranial hemorrhage  COMPARISON: MRI brain 09/15/2020. PET/CT 09/14/2020. TECHNIQUE: Axial CT images of the head  from skull base to vertex without contrast. FINDINGS: There is a hyperdense focus in the right posterior cerebellar lobe measuring 1.5 x 1.1 x 1.2 cm (7:27, 6:38) with surrounding encephalomalacia. There is no midline shift. No evidence of herniation. Density within the left basal ganglia, likely basal ganglia calcification. Diffuse cerebral atrophy with compensatory ventricular enlargement.  There is no evidence of acute infarct. No acute intracranial hemorrhage. No fractures are evident. The sinuses are pneumatized.     Right posterior cerebellar lobe hyperdensity with surrounding encephalomalacia. Findings were present on PET/CT dated 09/14/2020 and are favored to represent chronic postischemic changes or post-treatment change rather than acute intraparenchymal hemorrhage. The findings of this study were discussed via telephone with DR. HARISH ESWARAN by Dr. Kristian Covey on 02/14/2021 12:40 AM.     MRA Head Wo Contrast    Result Date: 02/15/2021  EXAM: Magnetic resonance angiography, head, without contrast material. DATE: 02/15/2021 9:07 AM ACCESSION: [id number] UN DICTATED: 02/15/2021 9:21 AM INTERPRETATION LOCATION: [facility] CLINICAL INDICATION: 70 years old Female with Evaluate for intracranial aneurysm  COMPARISON: 09/15/20 TECHNIQUE: Non-contrast MRA of the intracranial arterial circulation in the head was performed using 3D Time of Flight MRA FINDINGS: The visualized portions of the intracranial internal carotid, vertebral and basilar arteries are unremarkable. There is no high grade stenosis. No aneurysm visualized.     No aneurysm identified.    MRI Brain Wo Contrast    Result Date: 02/15/2021  EXAM: Magnetic resonance imaging, brain, without contrast material. DATE: 02/15/2021 12:18 AM ACCESSION: [id number] UN DICTATED: 02/15/2021 12:27 AM INTERPRETATION LOCATION: [facility] CLINICAL INDICATION: 70 years old Female with AMS; CLL, on zanubrutinib. COMPARISON: CT head 02/13/2021. TECHNIQUE: Multiplanar, multisequence MR imaging of the brain was performed without I.V. contrast. FINDINGS:  There are scattered and confluent  foci of signal abnormality within the periventricular and deep white matter.  These are nonspecific but commonly seen with small vessel ischemic changes. There is SWI signal dropout in the right cerebellum with mild associated intrinsic T1 hyperintensity (8:56). Additional punctate foci of SWI signal dropout in the bilateral cerebellar hemispheres, and left frontal lobe, compatible with microhemorrhages. Prominent ventricles likely related to cerebral atrophy and hydrocephalus ex vacuo. There is no midline shift. There is increased FLAIR signal surrounding the fourth ventricle. No diffusion weighted signal abnormality to suggest acute infarct. No mass.     Increased FLAIR signal surrounding the fourth ventricle which may reflect subarachnoid hemorrhage. Right cerebellar hemorrhage is likely subacute. Scattered microhemorrhages throughout the bilateral cerebral hemispheres. The findings of this study were discussed via telephone with DR. Thurmond Butts by Dr. Waynard Edwards on 02/15/2021 12:56 AM. ==================== ATTENDING ADDENDUM (02/15/2021 8:26 AM): The FLAIR signal along the fourth ventricle and in the posterior fossa may alternatively be due to a combination of CSF flow artifact and artifactual failed CSF suppression due to dental amalgam causing artifact through the posterior fossa. The cerebellar hemorrhages are favored to be chronic hemorrhages as they appears similar to prior MRI from 09/15/2020. Therefore no definitive acute intracranial hemorrhage. Consider short-term interval follow-up CT head to reassess.    Echocardiogram Transesophageal Echo    Result Date:  02/25/2021  Patient Info Name:     Cristina Fields Age:     7 years DOB:     [birth year] Gender:     Female MRN:     [id number] Accession #:     [id number] UN Ht:     150 cm Wt:     50 kg BSA:     1.45 m2 Technical Quality:     Fair Exam Date:     02/20/2021 9:06 AM Exam Location:     [facility] Admit Date:     02/13/2021 Exam Type:     ECHOCARDIOGRAM TRANSESOPHAGEAL ECHO Study Info Indications      - r/o endocarditis Complete two-dimensional, color flow and Doppler transesophageal study is performed. Staff Referring Physician:     Lenon Ahmadi ; Reading Fellow:     Alla German MD Sonographer:     Edwena Bunde Ordering Physician:     Kerin Perna Account #:     [id number] Summary   1. TEE to evaluate for vegetation.   2. Degenerative mitral valve disease with associated mild mitral valve regurgitation.   3. The aortic valve is trileaflet with mildly thickened leaflets with normal excursion.   4. The left atrium is dilated in size.   5. There is mild pulmonary hypertension, estimated pulmonary artery systolic pressure is approximately 40 mmHg.   6. Patent foramen ovale (PFO) with trivial right to left shunting by color flow.   7. No vegetations noted. Anesthesia/Analgesia   The patient received moderate sedation.   I personally spent 30 minutes continuously monitoring the patient during the administration of moderate sedation.   The patient was premedicated with 2.00 mg intravenous Versed.   The patient was premedicated with 50.00 mcg intravenous Fentanyl.   The patient was administered viscous Xylocaine 2% 5ml gargled and swallowed. Procedure Details   The patient tolerated the procedure well and there were no complications.   There was no difficulty inserting the probe.   Number of insertion attempts: 1.   The probe was inserted by the cardiologist.   I was present and supervised the entire procedure, including insertion of the transesophageal probe. Left Atrium   The left atrium is dilated in size. Right Atrium   There is a catheter present.   No vegetation noted on visualized portion of catheter. Of note the catheter tip is deep, nearing IVC/RA junction. Atrial Septum   Patent foramen ovale (PFO) with trivial right to left shunting by color flow. Aortic Valve   The aortic valve is trileaflet with mildly thickened leaflets with normal excursion.   There is trivial aortic regurgitation. Pulmonic Valve   The pulmonic valve is poorly visualized, but probably normal.   There is trivial pulmonic regurgitation. Mitral Valve   The mitral valve leaflets are mildly thickened with normal leaflet mobility.   Degenerative mitral valve disease with associated mild mitral valve regurgitation. Tricuspid Valve   The tricuspid valve leaflets are normal, with normal leaflet mobility.   There is mild to moderate tricuspid regurgitation.   There is mild pulmonary hypertension, estimated pulmonary artery systolic pressure is approximately 40 mmHg. Aorta   Moderate plaque in the ascending of the aorta.   The aorta is normal in size in the visualized segments. Tricuspid Valve ---------------------------------------------------------------------- Name Value        Normal ---------------------------------------------------------------------- TV Regurgitation Doppler ---------------------------------------------------------------------- TR Peak Velocity                     3  m/s Report Signatures Finalized by Yaakov Guthrie  MD on 02/25/2021 12:45 PM Resident Alla German  MD on 02/20/2021 12:24 PM    Echocardiogram W Colorflow Spectral Doppler    Result Date: 02/17/2021  Patient Info Name:     Cristina Fields Age:     1 years DOB:     03-Dec-1950 Gender:     Female MRN:     [id number] Accession #:     [id number] UN Ht:     150 cm Wt:     96 kg BSA:     2.05 m2 Technical Quality:     Fair Exam Date:     02/17/2021 9:28 AM Site Location:     [facility] Exam Location:     [facility] Admit Date:     02/13/2021 Exam Type:     ECHOCARDIOGRAM W COLORFLOW SPECTRAL DOPPLER Study Info Indications      - Strep bovis bactermia,  evaluate for valvular vegetations Complete two-dimensional, color flow and Doppler transthoracic echocardiogram is performed. Staff Referring Physician:     Lenon Ahmadi ; Reading Fellow:     Cheryll Dessert MD Sonographer:     Edwena Bunde Ordering Physician:     Lyndel Safe Account #:     [id number] Summary   1. The left ventricle is normal in size with mildly increased wall thickness.   2. The left ventricular systolic function is normal, LVEF is visually estimated at 50-55%.   3. There is grade II diastolic dysfunction (elevated filling pressure).   4. The mitral valve leaflets are mildly thickened with normal leaflet mobility.   5. There is mild mitral valve regurgitation.   6. The aortic valve is trileaflet with mildly thickened leaflets with normal excursion.   7. The left atrium is mildly dilated in size.   8. The right ventricle is normal in size, with mildly reduced systolic function.   9. There is mild to moderate tricuspid regurgitation.   10. Thickening of the aortic, mitral, and tricuspid valves. Likely degenerative change, but cannot rule out endocarditis. Left Ventricle   The left ventricle is normal in size with mildly increased wall thickness.   The left ventricular systolic function is normal, LVEF is visually estimated at 50-55%.   There is grade II diastolic dysfunction (elevated filling pressure). Right Ventricle   The right ventricle is normal in size, with mildly reduced systolic function. Left Atrium   The left atrium is mildly dilated in size. Right Atrium   The right atrium is normal  in size. Aortic Valve   The aortic valve is trileaflet with mildly thickened leaflets with normal excursion.   There is trivial aortic regurgitation.   There is no evidence of a significant transvalvular gradient. Pulmonic Valve   The pulmonic valve is poorly visualized, but probably normal.   There is trivial pulmonic regurgitation.   There is no evidence of a significant transvalvular gradient. Mitral Valve   The mitral valve leaflets are mildly thickened with normal leaflet mobility.   There is mild mitral valve regurgitation. Tricuspid Valve   Thickening of the aortic, mitral, and tricuspid valves. Likely degenerative change, but cannot rule out endocarditis.   There is mild to moderate tricuspid regurgitation. Pulmonary Arteries   The pulmonary artery is not well visualized. Pericardium/Pleural   There is no pericardial effusion. Inferior Vena Cava   The IVC is not well visualized precluding the ability to accurate assess right atrial pressure. Aorta   The aorta  is normal in size in the visualized segments. Pulmonic Valve ---------------------------------------------------------------------- Name                                 Value        Normal ---------------------------------------------------------------------- PV Doppler ---------------------------------------------------------------------- PV Peak Velocity                   1.3 m/s Mitral Valve ---------------------------------------------------------------------- Name                                 Value        Normal ---------------------------------------------------------------------- MV Diastolic Function ---------------------------------------------------------------------- MV E Peak Velocity                114 cm/s               MV A Peak Velocity                142 cm/s               MV E/A                                 0.8               MV Annular TDI ---------------------------------------------------------------------- MV Septal e' Velocity             6.1 cm/s         >=8.0 MV Lateral e' Velocity            7.8 cm/s        >=10.0 MV e' Average                          7.0               MV E/e' (Average)                     16.6 Tricuspid Valve ---------------------------------------------------------------------- Name                                 Value        Normal ---------------------------------------------------------------------- TV Regurgitation Doppler ---------------------------------------------------------------------- TR Peak Velocity                     3 m/s Aorta ---------------------------------------------------------------------- Name                                 Value        Normal ---------------------------------------------------------------------- Ascending Aorta ---------------------------------------------------------------------- Ao Root Diameter (2D)               2.6 cm               Ao Root Diam Index (2D)         12.7 cm/m2 Aortic Valve ---------------------------------------------------------------------- Name                                 Value        Normal ---------------------------------------------------------------------- AV Doppler ---------------------------------------------------------------------- AV Peak Velocity  1.8 m/s Ventricles ---------------------------------------------------------------------- Name Value        Normal ---------------------------------------------------------------------- LV Dimensions 2D/MM ---------------------------------------------------------------------- IVS Diastolic Thickness (2D)        1.1 cm       0.6-0.9 LVID Diastole (2D)                  4.2 cm       3.8-5.2 LVPW Diastolic Thickness (2D)                                1.1 cm       0.6-0.9 LVID Systole (2D)                   3.1 cm       2.2-3.5 RV Dimensions 2D/MM ---------------------------------------------------------------------- RV Basal Diastolic Dimension        3.2 cm       2.5-4.1 TAPSE                               1.5 cm         >=1.7 Atria ---------------------------------------------------------------------- Name                                 Value        Normal ---------------------------------------------------------------------- LA Dimensions ---------------------------------------------------------------------- LA Dimension (2D)                   4.0 cm       2.7-3.8 LA Volume (BP MOD)                   70 ml               LA Volume Index (BP MOD)       34.21 ml/m2   16.00-34.00 RA Dimensions ---------------------------------------------------------------------- RA Area (4C)                      14.2 cm2        <=18.0 RA Area (4C) Index              6.9 cm2/m2 Report Signatures Finalized by Yaakov Guthrie  MD on 02/17/2021 03:14 PM Resident Cheryll Dessert  MD on 02/17/2021 11:30 AM    CT Neck Soft Tissue Wo Contrast    Result Date: 03/06/2021  EXAM: Computed tomography, soft tissue neck without contrast material. DATE: 03/06/2021 ACCESSION: [id number] UN DICTATED: 03/06/2021 10:02 AM INTERPRETATION LOCATION: [facility] CLINICAL INDICATION: 70 years old Female with dispo pending; neck pain, r/o cancer progression of leukemia to lymphoma  COMPARISON: CT neck from 02/20/2021 TECHNIQUE: Axial CT images of the neck from the skull base through the thoracic inlet. Coronal and sagittal reformatted images, bone and soft tissue algorithm are provided. FINDINGS: Calcification in the right cerebellar hemisphere is unchanged. Mild mucosal thickening in the left maxillary sinus, otherwise the paranasal sinuses are pneumatized. The orbits are normal. The nasal cavity and nasopharynx are normal. The oropharynx and oral cavity are normal. The parapharyngeal spaces are clear. The salivary glands are normal. The larynx and hypopharynx are normal. Redemonstrated bilateral subcentimeter cervical chain lymph nodes measuring up to 0.8 cm in left level IIb (6:37), previously 0.6 cm. No pathologic lymphadenopathy. The thyroid gland is normal. 1.9 x 0.7 cm hyperattenuating soft tissue nodule  in the right upper anterior chest at the level of previously placed CVC (6:61), likely represents developing seroma versus collection. Soft tissue edema and inflammatory stranding throughout the lower neck, upper chest and axilla. Multilevel degenerative changes of the spine with stable mild retrolisthesis of C4 on C5 and anterolisthesis of C5 on C6. Interval increase in large left pleural effusion and pulmonary edema.     -Nonspecific mild increase in subcentimeter cervical chain lymph nodes measuring up to 0.8 cm, previously 0.6 cm. -1.9 cm hyperattenuating soft tissue nodule in the upper right anterior chest level previously placed CVC likely represents a developing seroma versus collection. Correlate with physical exam. -Increased size of large left pleural effusion, pulmonary edema, and body wall edema.     CT Neck Soft Tissue Wo Contrast    Result Date: 02/20/2021  EXAM: Computed tomography, soft tissue neck without contrast material. DATE: 02/20/2021 2:11 PM ACCESSION: [id number] UN DICTATED: 02/20/2021 2:21 PM INTERPRETATION LOCATION: [facility] CLINICAL INDICATION: 70 years old Female with Left sided neck and shoulder pain  -  Strep Bovis bacteremia  -  Concern for infectious myositis vs osteomyelitis v soft tissue infection  COMPARISON: Brain MRI 02/15/2021, head CT 02/14/2021, PET/CT 10/04/2020 TECHNIQUE: Axial CT images of the neck from the skull base through the thoracic inlet. Coronal and sagittal reformatted images, bone and soft tissue algorithm are provided. FINDINGS: Calcification in the right cerebellar hemisphere is unchanged. The paranasal sinuses are normal. The orbits are normal. The nasal cavity and nasopharynx are normal. The oropharynx and oral cavity are normal. The parapharyngeal spaces are clear. The salivary glands are normal. The larynx and hypopharynx are normal. There is a 6 cm left level IIB node (6-49). There is no pathologic lymphadenopathy in the neck. There are prominent left axillary lymph nodes measuring up to 1.0 cm, likely reactive. The thyroid gland is normal. There is inflammatory stranding in the fat surrounding the left shoulder and axilla. There is no organized or drainable fluid collection. The surrounding bones are unremarkable. There is multilevel degenerative change of the cervical spine with grade 1 retrolisthesis of C4 on C5 and anterolisthesis of C5 on C6. There is a right internal jugular central venous catheter, incompletely imaged. There is calcified atherosclerotic plaque at the right carotid bulb. There is a small left pleural effusion. The lung apices are otherwise normal.      Inflammatory stranding surrounding the left shoulder and axilla without organized or drainable fluid collection and mild reactive lymphadenopathy.  Please note this exam is limited without contrast and MRI with dotarem would be more accurate in light of CKD. The bones of the left shoulder are grossly unremarkable, though note that CT is insensitive for detection of early osteomyelitis. Small left pleural effusion.    IR Remove Tunneled Catheter    Result Date: 03/01/2021  EXAM:  TUNNELED CENTRAL VENOUS CATHETER REMOVAL DATE: 02/27/2021 12:24 PM ACCESSION: [id number] UN DICTATED: 02/27/2021 12:33 PM INTERPRETATION LOCATION: [facility] CLINICAL INDICATION: 70 years old Female with history of bacteremia requiring catheter removal, no longer requires a tunneled central venous catheter for treatment. CONSENT: Informed consent was obtained from the patient discussion of the alternatives, benefits, and risks including but not limited to infection, bleeding, need for additional procedure, and/or ICU admission. With the patient in the supine position, the existing catheter and skin entry site were prepped and draped in a sterile fashion.  TUNNELED CENTRAL VENOUS CATHETER REMOVAL: The indwelling heparin was withdrawn from both catheter lumens prior to removal. The suture securing  the catheter to the skin was cut. The tunnel exit site was anesthetized with 1% lidocaine. The catheter cuff was then mobilized using blunt dissection. The catheter and cuff were withdrawn and hemostasis was achieved using manual compression at the tunnel exit and vein access sites. The entire catheter cuff was removed intact. There was no purulence at the tunnel exit site. The tunnel exit site was dressed in a sterile manner with antibiotic dressing.     Successful removal of a tunneled hemodialysis catheter from the right internal jugular vein. Dr. Virl Son was present for and supervised the entire procedure.    ______________________________________________________________________  Discharge Instructions:     Activity Instructions     Activity as tolerated                Other Instructions     Call MD for:  difficulty breathing, headache or visual disturbances      Call MD for:  persistent nausea or vomiting      Call MD for:  severe uncontrolled pain      Call MD for:  temperature >38.5 Celsius      Discharge instructions      It was a pleasure taking care of you!  ??  Diagnosis: Chronic lymphoid leukemia, Strep Bovis Bacteremia  ??  Course complications:  -  Strep Bovis bacteremia: You presented with altered mental status and were found to have a blood infection. You were treated with antibiotics and your mental status returned to your baseline. You are being referred to Gastroenterology for consideration of a colonoscopy as gastrointestinal cancers can be associated with this type of blood infection.  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 These cells are associated with the HPV infection, which also can cause cancer in the cervix. You are being referred to see Gynecology after discharge to follow-up on this.   - Zanubrutinib, your cancer medication, was initially held because it can increase risk of bleeding during procedures. This was restarted during your admission.   - Anemia, thrombocytopenia: Your blood and platelet count remained low and so a bone marrow biopsy was performed. However, this was done when you were off zanubrutinib and your counts were at their lowest, which can affect your bone marrow. You should have another bone marrow biopsy done at a later time.  - End-stage renal disease: Hemodialysis was continued throughout your admission.  - Hypertension: Your blood pressure medications were uptitrated to maximum dosages. Near the end of your hospital stay, your amlodipine was re-started and was currently being uptitrated. You were discharged on 5mg  of amlodipine which can be further increased if needed.  ??  Chemotherapy during admission? No  ??  New/Important Medications:  - Voltaren (Diclofenac) gel: For muscle or joint pain  - Lidoderm patches: For muscle or joint pain  - Lasix: For leg swelling and increased body weight  - Melatonin: To help with sleep  - Miralax: Laxative  - We increased the doses for your blood pressure medications because your blood pressure remained high. Your medications may need to be further changed.  ??  Home Health Needs:                 - N/A  ??  Follow up Appointments:   - 03/08/21 @ 10:30AM: Lab draw & appointment with [address] with [facility] Hematology Oncology, 2nd floor, [facility].  [facility] Gastroenterology: a referral has been placed for you.  Please schedule an appointment when you receive a call to consider a colonoscopy. An infection with the bacteria Strep Bovis is associated with cancers in the gastrointestinal system. A colonoscopy would be useful to evaluate for these cancers.   [facility] Obstetrics/Gynecology: a referral has been placed for you. Please schedule an appointment when you receive a call for a pap smear & HPV testing. 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 These cells were not cancer cells, but they have a possibility of becoming cancer. They are associated with a virus called HPV (human papillomavirus) which can also cause these abnormal cells or cancer in the cervix.  - Primary care doctor: Please schedule an appointment with your primary doctor to discuss further adjusting of your blood pressure medications as needed. We increased the doses of your blood pressure medications. Your amlodipine was re-started at a lower dose. At the time of your discharge, you were on amlodipine 5mg . This may need to be increased and/or another medication may need to be added, such as a clonidine patch.  ??  --------------  ??  When to Call Your [facility] Cancer Care Team:   Monday- Friday from 8:00 am - 5:00 pm: Call [phone] or Toll free [phone].  Ask to speak to the Triage Nurse  On Nights, Weekends and Holidays: Call [phone]. Ask the operator to page the Oncology Fellow on Call   ??  RED ZONE:  Take action now!  You need to be seen right away. Call 911 or go to your nearest hospital for help.   - Symptoms are at a severe level of discomfort                 - Bleeding that will not stop                       - Chest Pain                 - Hard to breathe                                          - Fall or passing out                 - New Seizure                                 - Thoughts of hurting yourself or others ??  YELLOW ZONE: Take action today  This is NOT an all-inclusive list. Pleae call with any new or worsening symptoms.   Call your doctor, nurse or other healthcare provider at [phone]  You can be seen by a provider the same day through our Same Day Acute Care for Patients with Cancer program.   - Symptoms are new or worsening; You are not within your goal range for:                 - Pain                                                                                                                            -  Swelling (leg, arm, abdomen, face, neck)                 - Shortness of breath                                                                                               - Skin rash or skin changes                 - Bleeding (nose, urine, stool, wound)                                                  - Wound issues (redness, drainage, re-opened)                 - Feeling sick to your stomach and throwing up                                - Confusion                 - Mouth sores/pain in your mouth or throat                                                     - Vision changes                - Hard stool or very loose stools (increase in ostomy                       - Fever >100.4 F, chills                 Output)                                                                                                                      - Worsening cough with mucus that is green, yellow or bloody                - No urine for 12 hours                                                                             -  Pain or burning when going to the bathroom                 - Feeding tube or other catheter/tube issue                                                     - Home infusion pump issue - call [phone]                - Redness or pain at previous IV or port/catheter site                 - Depressed or anxiety   ??  GREEN ZONE: You are in control   Your symptoms are under control. Continue to take your medicine as ordered. Keep all visits to the doctor.   - No increase or worsening symptoms   - Able to take your medicine   - Able to drink and eat   ??  For your safety and best care, please DO NOT use MyChart messages to report red or yellow symptoms.   MyChart messages are only checked during weekday normal business hours and you should receive a   Response within 2 business days.   Please use MyChart only for the follows:   - Non-urgent medication refills, scheduling requests or general questions.       ??  ??  Patient Education:   ??  - Wash your hands routinely with soap and water  - Take your temperature when you have chills or are not feeling well  - Use a soft toothbrush  - Avoid constipation or straining with bowel movements. This may mean you occasionally need to take over-the-counter stool softeners or laxatives.   - Avoid people who have colds or the flu, or are not feeling well.  - Wear a mask when visiting crowded places.  - Maintain a well-balanced diet and eat healthy foods  - Speak with your doctor before having any dental work done  - Do only as much activity as you can tolerate  ??  Other instructions:  - Don't use dental floss if your platelet count is below 50,000. Your doctor or nurse should tell you if this is the case.  - Use any mouthwashes given to you as directed.  - If you can't tolerate regular brushing, use an oral swab (bristle-less) toothbrush, or use salt and baking soda to clean your mouth. Mix 1 teaspoon of salt and 1 teaspoon of baking soda into an 8-ounce glass of warm water. Swish and spit.  - Watch your mouth and tongue for white patches. This is a sign of fungal infection, a common side effect of chemotherapy. Be sure to tell your doctor about these patches. Medication/mouthwashes can be prescribed to help you fight the fungal infection.    ??  COVID-19 is a new challenge, but [place] and the [facility] is dedicated to providing you and your loved ones with the best possible cancer care and support in the safest way possible during this time. We made two videos about the ways we are working to keep you safe, such as offering the option to visit your care team over the phone or through a video, as well as support services offered for our patients and their caregivers. If you have any questions  about your cancer care, please call your care team.  ??  Video #1: Keeping [facility] Cancer Care patients safe during the COVID-19 crisis  http://go.eabjmlille.com  ??  Video #2: Support for cancer patients and their caregivers during the COVID-19 pandemic  http://go.SecureGap.uy          Follow Up instructions and Outpatient Referrals     Ambulatory referral to Gastroenterology      Ambulatory referral to Obstetrics / Gynecology      Call MD for:  difficulty breathing, headache or visual disturbances      Call MD for:  persistent nausea or vomiting      Call MD for:  severe uncontrolled pain      Call MD for:  temperature >38.5 Celsius      Discharge instructions          Appointments which have been scheduled for you    Mar 08, 2021 10:30 AM  (Arrive by 10:00 AM)  LAB ONLY [place] with ADULT ONC LAB  [facility] ADULT ONCOLOGY LAB DRAW STATION [place] [facility] REGION) [address]  [place] [address]  [phone]      Mar 08, 2021 11:30 AM  (Arrive by 11:00 AM)  EMERGENT [place] with Lenon Ahmadi, AGNP  [place] HEMATOLOGY ONCOLOGY 2ND FLR CANCER HOSP [facility] REGION) [address]  [place] [address]  [phone]      Mar 10, 2021 10:00 AM  (Arrive by 9:30 AM)  NURSE LAB DRAW with ADULT ONC LAB  [facility] ADULT ONCOLOGY LAB DRAW STATION [place] [facility] REGION) [address]  [place] [address]  [phone]      Mar 10, 2021 10:30 AM  (Arrive by 10:00 AM)  RETURN ACTIVE [place] with Park Breed, MD  [facility] HEMATOLOGY ONCOLOGY 2ND FLR CANCER HOSP [facility] REGION) [address]  [place] [address]  [phone]      Mar 10, 2021 11:00 AM  (Arrive by 10:30 AM)  BLOOD TRANSFUSION - 2 UNITS with [employer] CHAIR 29  [place] ONCOLOGY INFUSION [place] [facility] REGION) [address]  [place] [address]  [phone]      Mar 13, 2021  9:00 AM  (Arrive by 8:30 AM)  NURSE LAB DRAW with ADULT ONC LAB  [facility] ADULT ONCOLOGY LAB DRAW STATION [place] [facility] REGION) [address]  [place] [address]  [phone]      Mar 13, 2021 10:00 AM  (Arrive by 9:30 AM)  BLOOD TRANSFUSION - 2 UNITS with [employer] CHAIR 30  [place] ONCOLOGY INFUSION [facility] REGION) [address]  [place] [address]  [phone]      Mar 13, 2021 11:00 AM  (Arrive by 10:30 AM)  NEW PALLIATIVE CARE with Everlena Cooper, MD  [facility] ONCOLOGY INFUSION [place] [facility] REGION) [address]  [place] [address]  [phone]      Mar 20, 2021  8:00 AM  (Arrive by 7:30 AM)  BLOOD TRANSFUSION - 2 UNITS with ONCINF CHAIR 22  [place] ONCOLOGY INFUSION [place] [facility] REGION) [address]  [place] [address]  [phone]      Mar 24, 2021 10:00 AM  (Arrive by 9:30 AM)  NURSE LAB DRAW with ADULT ONC LAB  [facility] ADULT ONCOLOGY LAB DRAW STATION [place] [address]  [place] [address]  [phone]      Mar 24, 2021 11:00 AM  (Arrive by 10:30 AM)  BLOOD TRANSFUSION - 2 UNITS with [employer] CHAIR 24  [place] ONCOLOGY INFUSION [place] [facility] REGION) [address]  [place] [address]  [phone]      Mar 27, 2021 10:00 AM  (Arrive by 9:30 AM)  NURSE LAB DRAW with ADULT ONC LAB  [facility] ADULT ONCOLOGY LAB DRAW STATION [facility] REGION) [address]  [place] [address]  [phone]      Mar 27, 2021 11:00 AM  (Arrive by 10:30 AM)  BLOOD TRANSFUSION - 2 UNITS with ONCINF CHAIR 48  [place] ONCOLOGY INFUSION [place] [facility] REGION) [address]  [place] [address]  [phone]      Mar 31, 2021 10:30 AM  (Arrive by 10:00 AM)  NURSE LAB DRAW with ADULT ONC LAB  [facility] ADULT ONCOLOGY LAB DRAW STATION [facility] REGION) [address]  [place] [address]  [phone]      Mar 31, 2021 11:30 AM  (Arrive by 11:00 AM)  BLOOD TRANSFUSION - 2 UNITS with ONCINF CHAIR 40  [place] ONCOLOGY INFUSION [place] [facility] REGION) [address]  [place] [address]  [phone]      Apr 03, 2021 10:00 AM  (Arrive by 9:30 AM)  NURSE LAB DRAW with ADULT ONC LAB  [facility] ADULT ONCOLOGY LAB DRAW STATION [place] [facility] REGION) [address]  [place] [address]  [phone]      Apr 03, 2021 11:00 AM  (Arrive by 10:30 AM)  BLOOD TRANSFUSION - 2 UNITS with [employer] CHAIR 16  [place] ONCOLOGY INFUSION [facility] REGION) [address]  [place] [address]  [phone]      Apr 07, 2021  9:00 AM  (Arrive by 8:30 AM)  NURSE LAB DRAW with ADULT ONC LAB  [facility] ADULT ONCOLOGY LAB DRAW STATION [place] [facility] REGION) [address]  [place] [address]  [phone]      Apr 07, 2021 10:00 AM  (Arrive by 9:30 AM)  BLOOD TRANSFUSION - 2 UNITS with [employer] CHAIR 23  [place] ONCOLOGY INFUSION [place] [facility] REGION) [address]  [place] [address]  [phone]      Apr 10, 2021 11:30 AM  (Arrive by 11:00 AM)  NURSE LAB DRAW with ADULT ONC LAB  [facility] ADULT ONCOLOGY LAB DRAW STATION [place] [facility] REGION) [address]  [place] [address]  [phone]      Apr 10, 2021 12:30 PM  (Arrive by 12:00 PM)  BLOOD TRANSFUSION - 2 UNITS with [employer] CHAIR 37  [place] ONCOLOGY INFUSION [place] [facility] REGION) [address]  [place] [address]  [phone] Apr 14, 2021 11:30 AM  (Arrive by 11:00 AM)  NURSE LAB DRAW with ADULT ONC LAB  [facility] ADULT ONCOLOGY LAB DRAW STATION  [facility] REGION) [address]  [place] [address]  [phone]      Apr 14, 2021 12:30 PM  (Arrive by 12:00 PM)  BLOOD TRANSFUSION - 2 UNITS with [employer] CHAIR 41  [place] ONCOLOGY INFUSION  [facility] REGION) [address]  [place] [address]  [phone]      Apr 17, 2021 11:30 AM  (Arrive by 11:00 AM)  NURSE LAB DRAW with ADULT ONC LAB  [facility] ADULT ONCOLOGY LAB DRAW STATION [place] [facility] REGION) [address]  [place] [address]  [phone]      Apr 17, 2021 12:30 PM  (Arrive by 12:00 PM)  BLOOD TRANSFUSION - 2 UNITS with [employer] CHAIR 30  [place] ONCOLOGY INFUSION [place] [facility] REGION) [address]  [place] [address]  [phone]      Apr 21, 2021 11:30 AM  (Arrive by 11:00 AM)  NURSE LAB DRAW with ADULT ONC LAB  [facility] ADULT ONCOLOGY LAB DRAW STATION [place] [facility] REGION) [address]  [place] [address]  [phone]      Apr 21, 2021 12:30 PM  (Arrive by 12:00 PM)  BLOOD TRANSFUSION - 2 UNITS with [employer] CHAIR 41  [place] ONCOLOGY INFUSION [place] [facility] REGION) [address]  [place] [address]  [phone]      Apr 24, 2021 12:00 PM  (Arrive by 11:30 AM)  BLOOD TRANSFUSION - 2 UNITS with [employer] CHAIR 18  [place] ONCOLOGY INFUSION [place] [facility] REGION) [address]  [place] [address]  [phone]      Apr 28, 2021 11:30 AM  (Arrive by 11:00 AM)  NURSE LAB DRAW with ADULT ONC LAB  [facility] ADULT ONCOLOGY LAB DRAW STATION [place] [facility] REGION) [address]  [place] [address]  [phone]      Apr 28, 2021 12:30 PM  (Arrive by 12:00 PM)  BLOOD TRANSFUSION - 2 UNITS with ONCINF CHAIR 41  [place] ONCOLOGY INFUSION [facility] REGION) [address]  [place] [address]  [phone]           ______________________________________________________________________  Discharge Day Services:  BP 174/86  - Pulse 90  - Temp 36.7 ??C  - Resp 24  - Ht 149.9 cm (4' 11)  - Wt 47.9 kg (105 lb 9.6 oz)  - LMP  (LMP Unknown)  - SpO2 100%  - Breastfeeding No  - BMI 21.33 kg/m??   Pt seen on the day of discharge and determined appropriate for discharge.    General: Lying in bed comfortably in NAD.   Pulm: Normal work of breathing on RA. CTAB.  CV: RRR.   Neuro: Alert, no focal deficits.  Psych: Mood and affect appropriate    Condition at Discharge: stable    Length of Discharge: I spent greater than 30 mins in the discharge of this patient.    Pearline Cables, MD  PGY-1

## 2021-03-06 NOTE — Unmapped (Signed)
Drowsy, c/o neck and left shoulder pain. Voltaren, oxycodone and lidocaine applied. Room air.  Safety maintained, will continue to monitor.

## 2021-03-06 NOTE — Unmapped (Signed)
Pt AxOx4, forgetful at times.  Afebrile, VSS, and free of falls/injuries this shift.  Pt continues to have decreased appetite.  Pt had no other complaints this shift.  Continue to monitor.

## 2021-03-06 NOTE — Unmapped (Signed)
Hematology Resident (MEDE) Progress Note    Assessment & Plan:   Cristina Fields is a 70 y.o. female with CLL, ESRD on iHD, hypertension, prior cerebellar infarct that presented as a direct admission from infusion clinic with disorientation and pain. Found to have Strep Bovis bacteremia. She is currently finished with antibiotics and awaiting transfer to SNF, which is on hold as there is a COVID outbreak currently at [facility].    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 *    Streptococcus bovis bacteremia (resolved): BCx 2 of 2 positive. Source possibly seeding from condyloma biopsy. Last colonoscopy at [facility] in 12/2018 with one polyp removed but was otherwise negative. ICID was consulted. Tunneled HD line was removed on 5/9. TEE negative for endocarditis. She completed 2 weeks of CTX (4/27-5/11).  -Will need outpatient referral to GI for colonoscopy at discharge.     CLL - Anemia - Thrombocytopenia - Lymphocytosis:  Oncology history:  Follows with Dr. Lonni Fix. Diagnosed ~2012 and received fludarabine + cyclophosphamide + rituximab x6. Relapsed in 05/2014 treated with bendamustine + rituximab. Relapsed again in 2018 and started ibrutinib in 5/201. Rapid relapse in 04/2020 and was treated with rituximab and chlorambucil through November, 2021. On zanubrutinib prior to admission.   - Resumed zanubrutinib   - BMBx performed 5/6 this admission to assess persistent anemia & thrombocytopenia showing hypercellularity and increased marrow cellularity compared to previous. However, this biopsy was done after zanubrutinib was stopped. Recommending repeat BMBx in a few months while on zanubrutinib.  -Transfuse for hgb <7, platelets <10,000    Encephalopathy: Now improved and near baseline. Worsening likely multifactorial in the setting of bacteremia per above, toxic-metabolic/uremia, and medication/pain driven superimposed on cognitive impairment at baseline (SLUMS 15 as outpatient).   -Delirium precautions    Left AC OA - Neck Pain: CT scan 5/2 showed only non-specific inflammatory stranding in the fat surrounding the left shoulder and axilla. Repeated CT scan 5/16 due to recurrence of pain & previous CT report indicated 6cm cervical lymph node, which was incorrect; correct size is 0.6cm.   -No further imaging indicated.   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 Seen by lower GI surgery this admission with no surgical needs at this time.  -WOCN  -Outpatient pap smear to assess for cervical HPV/malignancy  -Pain well controlled on current regimen.     ESRD on iHD - HTN: TThSat Dialysis. Continues to remain hypertensive despite up-titration of home meds while admitted. Blood pressure measurements confirmed to be taken while patient is sitting up in bed. Patient reported being on amlodipine 10mg  at home, but this was not continued during admission. Will restart at low dose and uptitrate to avoid hypotension.  -iHD while inpatient. Will transition to TTS in the outpatient setting.   -Coreg 25 mg BID; Nifedipine XL 90 mg daily; Hydralazine 50mg  q6h; Losartan 100mg  daily  -Amlo 2.5mg  increased to 5mg  on 5/15; will re-evaluate & consider increasing on 5/17  -Lasix 80 mg daily    Daily Checklist:  Diet: Regular Diet  DVT PPx: Heparin  TID  Electrolytes: Replete Potassium to >/= 3.6 and Magnesium to >/= 1.8  Code Status: Full Code  Dispo: SNF on TBD    Team Contact Information:   Primary Team: Hematology Resident (MEDE)  Primary Resident: Pearline Cables, MD  Resident's Pager: [phone] (Hematology Intern - [place])    Interval History:     Has recurrence of neck pain, likely MSK.    Unable to transfer to SNF today due to COVID outbreak.    Objective:   Temp:  [36.5 ??C-37 ??C] 36.5 ??C  Heart Rate:  [70-91] 70  Resp:  [16-18] 18  BP: (95-173)/(54-66) 95/66  SpO2:  [93 %-100 %] 100 %    General: Lying in bed comfortably in NAD.   Pulm: Normal work of breathing on RA. Lungs clear anteriorly.   CV: RRR.   Neuro: Alert, no focal deficits.  Psych: Mood and affect appropriate.     Labs/Studies: Labs and Studies from the last 24hrs per EMR and Reviewed

## 2021-03-07 LAB — CBC W/ AUTO DIFF
BASOPHILS ABSOLUTE COUNT: 0.1 10*9/L (ref 0.0–0.1)
BASOPHILS RELATIVE PERCENT: 0.6 %
EOSINOPHILS ABSOLUTE COUNT: 0.3 10*9/L (ref 0.0–0.5)
EOSINOPHILS RELATIVE PERCENT: 1.7 %
HEMATOCRIT: 27.1 % — ABNORMAL LOW (ref 34.0–44.0)
HEMOGLOBIN: 8.7 g/dL — ABNORMAL LOW (ref 11.3–14.9)
LYMPHOCYTES ABSOLUTE COUNT: 16 10*9/L — ABNORMAL HIGH (ref 1.1–3.6)
LYMPHOCYTES RELATIVE PERCENT: 79.3 %
MEAN CORPUSCULAR HEMOGLOBIN CONC: 32.2 g/dL (ref 32.0–36.0)
MEAN CORPUSCULAR HEMOGLOBIN: 28.3 pg (ref 25.9–32.4)
MEAN CORPUSCULAR VOLUME: 87.9 fL (ref 77.6–95.7)
MEAN PLATELET VOLUME: 10.1 fL (ref 6.8–10.7)
MONOCYTES ABSOLUTE COUNT: 0.5 10*9/L (ref 0.3–0.8)
MONOCYTES RELATIVE PERCENT: 2.5 %
NEUTROPHILS ABSOLUTE COUNT: 3.2 10*9/L (ref 1.8–7.8)
NEUTROPHILS RELATIVE PERCENT: 15.9 %
PLATELET COUNT: 74 10*9/L — ABNORMAL LOW (ref 150–450)
RED BLOOD CELL COUNT: 3.08 10*12/L — ABNORMAL LOW (ref 3.95–5.13)
RED CELL DISTRIBUTION WIDTH: 23 % — ABNORMAL HIGH (ref 12.2–15.2)
WBC ADJUSTED: 20.2 10*9/L — ABNORMAL HIGH (ref 3.6–11.2)

## 2021-03-07 LAB — BASIC METABOLIC PANEL
ANION GAP: 11 mmol/L (ref 5–14)
BLOOD UREA NITROGEN: 32 mg/dL — ABNORMAL HIGH (ref 9–23)
BUN / CREAT RATIO: 6
CALCIUM: 9.7 mg/dL (ref 8.7–10.4)
CHLORIDE: 98 mmol/L (ref 98–107)
CO2: 27 mmol/L (ref 20.0–31.0)
CREATININE: 5.49 mg/dL — ABNORMAL HIGH
EGFR CKD-EPI AA FEMALE: 8 mL/min/{1.73_m2} — ABNORMAL LOW (ref >=60)
EGFR CKD-EPI NON-AA FEMALE: 7 mL/min/{1.73_m2} — ABNORMAL LOW (ref >=60)
GLUCOSE RANDOM: 217 mg/dL — ABNORMAL HIGH (ref 70–179)
POTASSIUM: 3.9 mmol/L (ref 3.4–4.8)
SODIUM: 136 mmol/L (ref 135–145)

## 2021-03-07 LAB — HEPATIC FUNCTION PANEL
ALBUMIN: 2.8 g/dL — ABNORMAL LOW (ref 3.4–5.0)
ALKALINE PHOSPHATASE: 126 U/L — ABNORMAL HIGH (ref 46–116)
ALT (SGPT): 15 U/L (ref 10–49)
AST (SGOT): 26 U/L (ref <=34)
BILIRUBIN DIRECT: 0.1 mg/dL (ref 0.00–0.30)
BILIRUBIN TOTAL: 0.3 mg/dL (ref 0.3–1.2)
PROTEIN TOTAL: 5.3 g/dL — ABNORMAL LOW (ref 5.7–8.2)

## 2021-03-07 LAB — PHOSPHORUS: PHOSPHORUS: 5.2 mg/dL — ABNORMAL HIGH (ref 2.4–5.1)

## 2021-03-07 LAB — MAGNESIUM: MAGNESIUM: 1.9 mg/dL (ref 1.6–2.6)

## 2021-03-07 MED ADMIN — heparin (porcine) 5,000 unit/mL injection 5,000 Units: 5000 [IU] | SUBCUTANEOUS | @ 18:00:00 | Stop: 2021-03-07

## 2021-03-07 MED ADMIN — heparin (porcine) 5,000 unit/mL injection 5,000 Units: 5000 [IU] | SUBCUTANEOUS

## 2021-03-07 MED ADMIN — labetaloL (NORMODYNE,TRANDATE) injection 20 mg: 20 mg | INTRAVENOUS | @ 15:00:00 | Stop: 2021-03-07

## 2021-03-07 MED ADMIN — diclofenac sodium (VOLTAREN) 1 % gel 2 g: 2 g | TOPICAL | @ 09:00:00 | Stop: 2021-03-07

## 2021-03-07 MED ADMIN — epoetin alfa-EPBx (RETACRIT) injection 4,000 Units: 4000 [IU] | INTRAVENOUS | @ 15:00:00 | Stop: 2021-03-07

## 2021-03-07 MED ADMIN — melatonin tablet 3 mg: 3 mg | ORAL

## 2021-03-07 MED ADMIN — ondansetron (ZOFRAN) tablet 4 mg: 4 mg | ORAL | @ 18:00:00 | Stop: 2021-03-07

## 2021-03-07 MED ADMIN — oxyCODONE (ROXICODONE) immediate release tablet 2.5 mg: 2.5 mg | ORAL | @ 14:00:00 | Stop: 2021-03-07

## 2021-03-07 MED ADMIN — hydrALAZINE (APRESOLINE) tablet 50 mg: 50 mg | ORAL | @ 18:00:00 | Stop: 2021-03-07

## 2021-03-07 MED ADMIN — zanubrutinib (BRUKINSA) capsule 160 mg: 160 mg | ORAL | @ 18:00:00 | Stop: 2021-03-07

## 2021-03-07 MED ADMIN — oxyCODONE (ROXICODONE) immediate release tablet 2.5 mg: 2.5 mg | ORAL | @ 19:00:00 | Stop: 2021-03-07

## 2021-03-07 MED ADMIN — sertraline (ZOLOFT) tablet 100 mg: 100 mg | ORAL

## 2021-03-07 MED ADMIN — paricalcitoL (ZEMPLAR) injection 5 mcg: 5 ug | INTRAVENOUS | @ 15:00:00 | Stop: 2021-03-07

## 2021-03-07 MED ADMIN — acetaminophen (TYLENOL) tablet 1,000 mg: 1000 mg | ORAL | @ 09:00:00 | Stop: 2021-03-07

## 2021-03-07 MED ADMIN — amLODIPine (NORVASC) tablet 5 mg: 5 mg | ORAL | @ 18:00:00 | Stop: 2021-03-07

## 2021-03-07 MED ADMIN — heparin (porcine) 1000 unit/mL injection 3,000 Units: 3000 [IU] | INTRAVENOUS | @ 14:00:00 | Stop: 2021-03-07

## 2021-03-07 MED ADMIN — carvediloL (COREG) tablet 25 mg: 25 mg | ORAL

## 2021-03-07 MED ADMIN — oxyCODONE (ROXICODONE) immediate release tablet 2.5 mg: 2.5 mg | ORAL | Stop: 2021-03-06

## 2021-03-07 MED ADMIN — carvediloL (COREG) tablet 25 mg: 25 mg | ORAL | @ 18:00:00 | Stop: 2021-03-07

## 2021-03-07 MED ADMIN — hydrALAZINE (APRESOLINE) tablet 50 mg: 50 mg | ORAL | @ 05:00:00 | Stop: 2021-03-07

## 2021-03-07 MED ADMIN — NIFEdipine (PROCARDIA XL) 24 hr tablet 90 mg: 90 mg | ORAL | @ 18:00:00 | Stop: 2021-03-07

## 2021-03-07 MED ADMIN — losartan (COZAAR) tablet 100 mg: 100 mg | ORAL | @ 18:00:00 | Stop: 2021-03-07

## 2021-03-07 MED ADMIN — furosemide (LASIX) tablet 80 mg: 80 mg | ORAL | @ 18:00:00 | Stop: 2021-03-07

## 2021-03-07 MED ADMIN — diclofenac sodium (VOLTAREN) 1 % gel 2 g: 2 g | TOPICAL

## 2021-03-07 MED ADMIN — lidocaine (LIDODERM) 5 % patch 2 patch: 2 | TRANSDERMAL | @ 02:00:00

## 2021-03-07 MED ADMIN — acetaminophen (TYLENOL) tablet 1,000 mg: 1000 mg | ORAL | @ 02:00:00

## 2021-03-07 MED ADMIN — hydrALAZINE (APRESOLINE) tablet 50 mg: 50 mg | ORAL | @ 09:00:00 | Stop: 2021-03-07

## 2021-03-07 MED ADMIN — acetaminophen (TYLENOL) tablet 1,000 mg: 1000 mg | ORAL | @ 18:00:00 | Stop: 2021-03-07

## 2021-03-07 MED ADMIN — atorvastatin (LIPITOR) tablet 40 mg: 40 mg | ORAL | @ 18:00:00 | Stop: 2021-03-07

## 2021-03-07 NOTE — Unmapped (Signed)
[facility] Nephrology Hemodialysis Procedure Note     03/07/2021    Cristina Fields was seen and examined on hemodialysis    CHIEF COMPLAINT: End Stage Renal Disease    INTERVAL HISTORY:   Elevated Bps during HD but asymptomatic     DIALYSIS TREATMENT DATA:  Estimated Dry Weight (kg): 47.5 kg (104 lb 11.5 oz)  Patient Goal Weight (kg): 0 kg (0 lb)  Dialyzer: F-180 (98 mLs)  Dialysis Bath  Bath: 3 K+ / 2.5 Ca+  Dialysate Na (mEq/L): 137 mEq/L  Dialysate HCO3 (mEq/L): 35 mEq/L  Dialysate Total Buffer HCO3 (mEq/L): 35 mEq/L  Blood Flow Rate (mL/min): 400 mL/min  Dialysis Flow (mL/min): 800 mL/min    PHYSICAL EXAM:  Vitals:  Temp:  [36.5 ??C-36.9 ??C] 36.5 ??C  Heart Rate:  [70-92] 92  BP: (95-193)/(57-113) 192/85  MAP (mmHg):  [76-123] 89  Weights:  Pre-Treatment Weight (kg): 47.3 kg (104 lb 4.4 oz)    General: in no acute distress, currently dialyzing in a chair  Pulmonary: normal respiratory effort  Cardiovascular: regular rate and rhythm  Extremities: trace  edema  Access: LUE AV graft    LAB DATA:  Lab Results   Component Value Date    NA 136 03/07/2021    K 3.9 03/07/2021    CL 98 03/07/2021    CO2 27.0 03/07/2021    BUN 32 (H) 03/07/2021    CREATININE 5.49 (H) 03/07/2021    CALCIUM 9.7 03/07/2021    MG 1.9 03/07/2021    PHOS 5.2 (H) 03/07/2021    ALBUMIN 2.7 (L) 03/04/2021      Lab Results   Component Value Date    HCT 27.1 (L) 03/07/2021    WBC 20.2 (H) 03/07/2021        ASSESSMENT/PLAN:  End Stage Renal Disease on Intermittent Hemodialysis:  UF goal: 1L as tolerated  Adjust medications for a GFR <10  Avoid nephrotoxic agents     Bone Mineral Metabolism:  Lab Results   Component Value Date    CALCIUM 9.7 03/07/2021    CALCIUM 9.6 03/06/2021    Lab Results   Component Value Date    ALBUMIN 2.7 (L) 03/04/2021    ALBUMIN 2.7 (L) 03/01/2021      Lab Results   Component Value Date    PHOS 5.2 (H) 03/07/2021    PHOS 4.1 03/06/2021    No results found for: PTH   Labs appropriate, no changes.    Anemia:   Lab Results Component Value Date    HGB 8.7 (L) 03/07/2021    HGB 7.8 (L) 03/06/2021    HGB 7.7 (L) 03/05/2021    Iron Saturation (%)   Date Value Ref Range Status   02/13/2021 21 % Final      Lab Results   Component Value Date    FERRITIN 2,051.1 (H) 02/13/2021       Anemia labs appropriate, no changes.    Payton Emerald, MD  [facility] Division of Nephrology & Hypertension

## 2021-03-07 NOTE — Unmapped (Signed)
[facility] to [facility] by BLS. BP increased MD notified and at [facility] to assess with all clear to DC.  Problem: Adult Inpatient Plan of Care  Goal: Plan of Care Review  Outcome: Transitioned to Another Facility  Goal: Patient-Specific Goal (Individualized)  Outcome: Transitioned to Another Facility  Goal: Absence of Hospital-Acquired Illness or Injury  Outcome: Transitioned to Another Facility  Intervention: Identify and Manage Fall Risk  Recent Flowsheet Documentation  Taken 03/07/2021 0715 by Silvano Bilis, RN  Safety Interventions:   fall reduction program maintained   low bed   no IV/BP/blood draw left arm   nonskid shoes/slippers when out of bed  Intervention: Prevent and Manage VTE (Venous Thromboembolism) Risk  Recent Flowsheet Documentation  Taken 03/07/2021 0823 by Silvano Bilis, RN  Activity Management: ambulated to bathroom  Goal: Optimal Comfort and Wellbeing  Outcome: Transitioned to Another Facility  Goal: Readiness for Transition of Care  Outcome: Transitioned to Another Facility  Goal: Rounds/Family Conference  Outcome: Transitioned to Another Facility     Problem: Impaired Wound Healing  Goal: Optimal Wound Healing  Outcome: Transitioned to Another Facility  Intervention: Promote Wound Healing  Recent Flowsheet Documentation  Taken 03/07/2021 0823 by Silvano Bilis, RN  Activity Management: ambulated to bathroom     Problem: Device-Related Complication Risk (Hemodialysis)  Goal: Safe, Effective Therapy Delivery  Outcome: Transitioned to Another Facility     Problem: Self-Care Deficit  Goal: Improved Ability to Complete Activities of Daily Living  Outcome: Transitioned to Another Facility     Problem: Fall Injury Risk  Goal: Absence of Fall and Fall-Related Injury  Outcome: Transitioned to Another Facility  Intervention: Promote Injury-Free Environment  Recent Flowsheet Documentation  Taken 03/07/2021 0715 by Silvano Bilis, RN  Safety Interventions:   fall reduction program maintained   low bed no IV/BP/blood draw left arm   nonskid shoes/slippers when out of bed

## 2021-03-07 NOTE — Unmapped (Signed)
HEMODIALYSIS NURSE PROCEDURE NOTE    Treatment Number:  10 Room/Station:  3 Procedure Date:  03/07/21   Total Treatment Time:  255 Min.    CONSENT:  Written consent was obtained prior to the procedure and is detailed in the medical record. Prior to the start of the procedure, a time out was taken and the identity of the patient was confirmed via name, medical record number and date of birth.     WEIGHTS:  Hemodialysis Pre-Treatment Weights     Date/Time Pre-Treatment Weight (kg) Estimated Dry Weight (kg) Patient Goal Weight (kg) Total Goal Weight (kg)    03/07/21 0857 47.3 kg (104 lb 4.4 oz) 47.5 kg (104 lb 11.5 oz) 0 kg (0 lb) 0.55 kg (1 lb 3.4 oz)           Hemodialysis Post Treatment Weights     Date/Time Post-Treatment Weight (kg) Treatment Weight Change (kg)    03/07/21 1300 45.9 kg (101 lb 3.1 oz) -1.4 kg        Active Dialysis Orders (168h ago, onward)     Start     Ordered    03/04/21 1412  Hemodialysis inpatient  Every Tue,Thu,Sat      Comments: [facility] IS NOT A DIALYSIS ACCESS   Question Answer Comment   K+ 3 meq/L    Ca++ 2.5 meq/L    Bicarb 35 meq/L    Na+ 137 meq/L    Na+ Modeling no    Dialyzer F180NR    Dialysate Temperature (C) 37    BFR-As tolerated to a maximum of: 500 mL/min    DFR 800 mL/min    Duration of treatment 3.5 Hr    Dry weight (kg) 47.5kg (new 5/10)    Challenge dry weight (kg) no    Fluid removal (L) 1 L 5/14    Tubing Adult = 142 ml    Access Site AVG    Access Site Location Left    Keep SBP >: 95        03/04/21 1411    02/28/21 1511  Hemodialysis inpatient  Every Tue,Thu,Sat,   Status:  Canceled      Comments: [facility]IJ IS NOT A DIALYSIS ACCESS   Question Answer Comment   K+ 3 meq/L    Ca++ 2.5 meq/L    Bicarb 35 meq/L    Na+ 137 meq/L    Na+ Modeling no    Dialyzer F180NR    Dialysate Temperature (C) 37    BFR-As tolerated to a maximum of: 500 mL/min    DFR 800 mL/min    Duration of treatment 3.5 Hr    Dry weight (kg) 47.5kg (new 5/10)    Challenge dry weight (kg) no    Fluid removal (L) 1L 5/10    Tubing Adult = 142 ml    Access Site AVG    Access Site Location Left    Keep SBP >: 95        02/28/21 1510              ACCESS SITE:             Arteriovenous Fistula - Vein Graft  Access Arteriovenous vein graft Left;Upper Arm (Active)   Site Assessment Clean;Dry;Intact 03/07/21 0830   AV Fistula Thrill Present;Bruit Present 03/07/21 0830   Status Accessed 03/07/21 0830   Dressing Intervention No intervention needed 03/06/21 2000   Dressing Status      Clean;Dry;Intact/not removed 03/06/21 2000   Site Condition No  complications 03/07/21 0830   Dressing Gauze 03/04/21 1743   Dressing To Be Removed (Date/Time) @ 2000 03/04/21 1743     Catheter Fill Volumes:  Arterial:   mL Venous:   mL   Catheter filled with  post procedure.    Patient Lines/Drains/Airways Status     Active Peripheral & Central Intravenous Access     Name Placement date Placement time Site Days    Peripheral IV 02/27/21 Posterior;Right Forearm 02/27/21  0126  Forearm  8              LAB RESULTS:  Lab Results   Component Value Date    NA 136 03/07/2021    K 3.9 03/07/2021    CL 98 03/07/2021    CO2 27.0 03/07/2021    BUN 32 (H) 03/07/2021    CREATININE 5.49 (H) 03/07/2021    GLU 217 (H) 03/07/2021    CALCIUM 9.7 03/07/2021    PHOS 5.2 (H) 03/07/2021    MG 1.9 03/07/2021    IRON 35 (L) 02/13/2021    LABIRON 21 02/13/2021    FERRITIN 2,051.1 (H) 02/13/2021    TIBC 163 (L) 02/13/2021     Lab Results   Component Value Date    WBC 20.2 (H) 03/07/2021    HGB 8.7 (L) 03/07/2021    HCT 27.1 (L) 03/07/2021    PLT 74 (L) 03/07/2021    APTT 33.0 02/15/2021        VITAL SIGNS:    Hemodynamics     Date/Time Pulse BP MAP (mmHg) Patient Position    03/07/21 1330 90 158/69 ??? Lying    03/07/21 1200 91 160/81 ??? Lying    03/07/21 1130 85 172/84 ??? Lying    03/07/21 1100 92 192/85 ??? Lying    03/07/21 1045 90 193/86 ??? Lying    03/07/21 1015 88 175/84 ??? Lying    03/07/21 0945 87 155/69 ??? Lying        Blood Volume Monitor     Date/Time Blood Volume Change (%) HCT HGB Critline O2 SAT %    03/07/21 1200 -9.5 % 28.6 9.7 95.6    03/07/21 1130 -9.9 % 28.8 9.8 97.1    03/07/21 1100 -9.7 % 28.7 9.8 95.8    03/07/21 1045 -10.1 % 28.8 9.8 95.7    03/07/21 1015 -9.4 % 28.6 9.7 95.9    03/07/21 0945 -9 % 28.5 9.7 97.8        Oxygen Therapy     Date/Time Resp SpO2 O2 Device FiO2 (%) O2 Flow Rate (L/min)    03/07/21 1330 18 ??? None (Room air) -- ???    03/07/21 1200 16 ??? None (Room air) -- ???    03/07/21 1130 16 ??? None (Room air) -- ???    03/07/21 1100 16 ??? None (Room air) -- ???    03/07/21 1045 16 ??? None (Room air) -- ???    03/07/21 1015 16 ??? None (Room air) -- ???    03/07/21 0945 18 ??? None (Room air) -- ???        Oxygen Connected to Wall:  no    Pre-Hemodialysis Assessment     Date/Time Therapy Number Dialyzer All Machine Alarms Passed Air Detector Dialysis Flow (mL/min)    03/07/21 0857 10 F-180 (98 mLs) Yes Engaged 800 mL/min    Date/Time Verify Priming Solution Priming Volume Hemodialysis Independent pH Hemodialysis Machine Conductivity (mS/cm) Hemodialysis Independent Conductivity (mS/cm)    03/07/21 0857 0.9% NS  300 mL ???  n/a 13.7 mS/cm 13.8 mS/cm    Date/Time Bicarb Conductivity Residual Bleach Negative Free Chlorine Total Chlorine Chloramine    03/07/21 0857 -- Yes -- 0 --          Hemodialysis Treatment     Date/Time Blood Flow Rate (mL/min) Arterial Pressure (mmHg) Venous Pressure (mmHg) Transmembrane Pressure (mmHg)    03/07/21 1330 ??? ??? ??? ???    03/07/21 1200 400 mL/min -122 mmHg 208 mmHg 232 mmHg    03/07/21 1130 400 mL/min -103 mmHg 206 mmHg 60 mmHg    03/07/21 1100 400 mL/min -96 mmHg 211 mmHg 64 mmHg    03/07/21 1045 400 mL/min -102 mmHg 200 mmHg 63 mmHg    03/07/21 1015 400 mL/min -95 mmHg 207 mmHg 53 mmHg    03/07/21 0945 400 mL/min -100 mmHg 198 mmHg 56 mmHg    03/07/21 0915 400 mL/min -75 mmHg 165 mmHg 62 mmHg    Date/Time Ultrafiltration Rate (mL/hr) Ultrafiltrate Removed (mL) Dialysate Flow Rate (mL/min) KECN (Kecn)    03/07/21 1330 ??? 1550 mL ??? ???    03/07/21 1200 560 mL/hr 1059 mL 800 ml/min ???    03/07/21 1130 560 mL/hr 812 mL 800 ml/min ???    03/07/21 1100 550 mL/hr 543 mL 800 ml/min ???    03/07/21 1045 550 mL/hr 399 mL 800 ml/min ???    03/07/21 1015 120 mL/hr 255 mL 800 ml/min ???    03/07/21 0945 120 mL/hr 194 mL 800 ml/min ???    03/07/21 0915 440 mL/hr 5 mL 800 ml/min ???        Hemodialysis Treatment Comments     Date/Time Intra-Hemodialysis Comments    03/07/21 1330 Tx completed.rinsback    03/07/21 1200 pt stable    03/07/21 1130 pt stable    03/07/21 1100 BP 190's. IV Labetolol given per Nephrologist    03/07/21 1045 hypertensive,notified Dr Hillery Hunter    03/07/21 1015 pt stable.resting    03/07/21 0945 Dr Elson Clan rounding    03/07/21 0915 HD initiated        Post Treatment     Date/Time Rinseback Volume (mL) On Line Clearance: spKt/V Total Liters Processed (L/min) Dialyzer Clearance    03/07/21 1300 300 mL 2.33 spKt/V 79.7 L/min Moderately streaked          Post Hemodialysis Treatment Comments     Date/Time Post-Hemodialysis Comments    03/07/21 1300 alert and stable        POST TREATMENT ASSESSMENT:  General appearance:  alert  Neurological:  Grossly normal  Lungs:  clear to auscultation bilaterally  Hearts:  regular rate and rhythm, S1, S2 normal, no murmur, click, rub or gallop  Abdomen:  soft, non-tender; bowel sounds normal; no masses,  no organomegaly  Pulses:    Skin:  Skin color, texture, turgor normal. No rashes or lesions    Hemodialysis I/O     Date/Time Total Hemodialysis Replacement Volume (mL) Total Ultrafiltrate Output (mL)    03/07/21 1300 ??? 1000 mL        [id number] - Medicaitons Given During Treatment  (last 4 hrs)         Kaja Jackowski Warrick Parisian, RN       Medication Name Action Time Action Route Rate Dose User     epoetin alfa-EPBx (RETACRIT) injection 4,000 Units 03/07/21 1127 Given Intravenous  4,000 Units Millette Halberstam Warrick Parisian, RN     heparin (porcine) 1000 unit/mL injection 3,000 Units 03/07/21 0955 Given  Intravenous  3,000 Units Nazier Neyhart Warrick Parisian, RN labetaloL (NORMODYNE,TRANDATE) injection 20 mg 03/07/21 1112 Given Intravenous  20 mg Makella Buckingham Warrick Parisian, RN     paricalcitoL [facility]) injection 5 mcg 03/07/21 1127 Given Intravenous  5 mcg Floris Neuhaus Warrick Parisian, RN                  Patient tolerated treatment in a  Dialysis Recliner.

## 2021-03-08 ENCOUNTER — Other Ambulatory Visit: Admit: 2021-03-08 | Discharge: 2021-03-09 | Payer: MEDICARE

## 2021-03-08 ENCOUNTER — Ambulatory Visit: Admit: 2021-03-08 | Discharge: 2021-03-09 | Payer: MEDICARE | Attending: Adult Health | Primary: Adult Health

## 2021-03-08 DIAGNOSIS — A630 Anogenital (venereal) warts: Secondary | ICD-10-CM | POA: Diagnosis not present

## 2021-03-08 DIAGNOSIS — C9112 Chronic lymphocytic leukemia of B-cell type in relapse: Secondary | ICD-10-CM | POA: Diagnosis not present

## 2021-03-08 DIAGNOSIS — A491 Streptococcal infection, unspecified site: Secondary | ICD-10-CM | POA: Diagnosis not present

## 2021-03-08 LAB — COMPREHENSIVE METABOLIC PANEL
ALBUMIN: 2.9 g/dL — ABNORMAL LOW (ref 3.4–5.0)
ALKALINE PHOSPHATASE: 130 U/L — ABNORMAL HIGH (ref 46–116)
ALT (SGPT): 17 U/L (ref 10–49)
ANION GAP: 3 mmol/L — ABNORMAL LOW (ref 5–14)
AST (SGOT): 27 U/L (ref <=34)
BILIRUBIN TOTAL: 0.3 mg/dL (ref 0.3–1.2)
BLOOD UREA NITROGEN: 19 mg/dL (ref 9–23)
BUN / CREAT RATIO: 6
CALCIUM: 9.5 mg/dL (ref 8.7–10.4)
CHLORIDE: 101 mmol/L (ref 98–107)
CO2: 32 mmol/L — ABNORMAL HIGH (ref 20.0–31.0)
CREATININE: 3.34 mg/dL — ABNORMAL HIGH
EGFR CKD-EPI AA FEMALE: 15 mL/min/{1.73_m2} — ABNORMAL LOW (ref >=60)
EGFR CKD-EPI NON-AA FEMALE: 13 mL/min/{1.73_m2} — ABNORMAL LOW (ref >=60)
GLUCOSE RANDOM: 153 mg/dL (ref 70–179)
POTASSIUM: 4.1 mmol/L (ref 3.4–4.8)
PROTEIN TOTAL: 5.7 g/dL (ref 5.7–8.2)
SODIUM: 136 mmol/L (ref 135–145)

## 2021-03-08 LAB — CBC W/ AUTO DIFF
BASOPHILS ABSOLUTE COUNT: 0.1 10*9/L (ref 0.0–0.1)
BASOPHILS RELATIVE PERCENT: 0.7 %
EOSINOPHILS ABSOLUTE COUNT: 0.4 10*9/L (ref 0.0–0.5)
EOSINOPHILS RELATIVE PERCENT: 1.8 %
HEMATOCRIT: 28.2 % — ABNORMAL LOW (ref 34.0–44.0)
HEMOGLOBIN: 8.9 g/dL — ABNORMAL LOW (ref 11.3–14.9)
LYMPHOCYTES ABSOLUTE COUNT: 15.9 10*9/L — ABNORMAL HIGH (ref 1.1–3.6)
LYMPHOCYTES RELATIVE PERCENT: 78.7 %
MEAN CORPUSCULAR HEMOGLOBIN CONC: 31.6 g/dL — ABNORMAL LOW (ref 32.0–36.0)
MEAN CORPUSCULAR HEMOGLOBIN: 28 pg (ref 25.9–32.4)
MEAN CORPUSCULAR VOLUME: 88.4 fL (ref 77.6–95.7)
MEAN PLATELET VOLUME: 10.3 fL (ref 6.8–10.7)
MONOCYTES ABSOLUTE COUNT: 0.6 10*9/L (ref 0.3–0.8)
MONOCYTES RELATIVE PERCENT: 3 %
NEUTROPHILS ABSOLUTE COUNT: 3.2 10*9/L (ref 1.8–7.8)
NEUTROPHILS RELATIVE PERCENT: 15.8 %
PLATELET COUNT: 76 10*9/L — ABNORMAL LOW (ref 150–450)
RED BLOOD CELL COUNT: 3.19 10*12/L — ABNORMAL LOW (ref 3.95–5.13)
RED CELL DISTRIBUTION WIDTH: 22.2 % — ABNORMAL HIGH (ref 12.2–15.2)
WBC ADJUSTED: 20.2 10*9/L — ABNORMAL HIGH (ref 3.6–11.2)

## 2021-03-08 LAB — SLIDE REVIEW

## 2021-03-08 NOTE — Unmapped (Signed)
Follow Up Visit Note    Patient Name: Cristina Fields  Patient Age: 70 y.o.  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 female who presents for follow up of relapsed/refractory CLL.    Therapy: zanubrutinib 160mg  ONCE daily - chosen as safest option with dialysis and PPI use. See 1/14 note for discussion of treatment options.  Treatment indication: significant drenching night sweats, anemia and thrombocytopenia.    She has now been on zanubrutinib for appx 3 months. Anemia and thrombocytopenia have worsened. Her course has been complicated by 2 hospitalizations, most recently for strep bovis bacteremia, and she is quite frail overall. Bone marrow biopsy to evaluate her cytopenias does show a persistent burden if CLL, not unexpected early in treatment.     We recommend continuing the zanubrutinib (dosed after dialysis) and allowing more time to watch for response to treatment. I think she will manage with 1 day/week for transfusion support. This would allow at some rehab (she also has dialysis T,Th, Sat).    Monitor BP - may require increased dose amlodipine. Can be managed by her nephrologist    Plans and Recommendations:  1. CLL  - continue zanu 320 mg 3x weekly dosing s/p dialysis  - reiterated increased risk for bleeding on drug to pt, should report if any increase in rectal bleeding/bleeding from condyloma  - RTC 1 month    2. Anemia/thrombocytopenia - multifactorial - CLL and renal failure vs other (?zanu tox?).   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 Hx strep bovis bacteremia -    - Referred for colonoscopy     5. HTN: mildly elevated today, have previously recommended she follow up with her nephrologist    6. Health maintenance  - vaccines:  Has gotten 3 covid vaccines and seasonal flu vaccine  - Received Evusheld 11/04/2020, may consider catch up dose at future visit if hasn't received full 300 dosing    Due to this patient's diagnosis, she is at significant risk for subsequent morbidity and/or mortality.    These services include the following elements of medical decision making:  addressing a hematologic cancer that poses a threat to life and/or bone marrow function  interpretation of diagnostic test reports or ordering of diagnostic tests and independent interpretation of diagnostic tests    I personally spent 45 minutes face-to-face and non-face-to-face in the care of this patient, which includes all pre, intra, and post visit time on the date of service.    Langley Gauss, AGPCNP-BC  Nurse Practitioner  Hematology/Oncology  [facility]    Dr. Lonni Fix was available    History of Present Illness:     Cristina Fields is a 70 y.o. female who is seen in consultation at the request of Lavera Guise Jack Quarto, MD for follow up of CLL/SLL.    Interim History  She has been hospitalized at [facility] with strep bovis bacteremia. She is now staying in a rehab facility. She has only been there 1 day thus far, but no issues have occurred. She feels weak, low energy.     REVIEW OF  SYSTEMS:   CONSTITUTIONAL: No fevers, chills, + weight loss,+ drenching night sweats.   HEENT: Eyes: No blurred vision. ENT: No earache, sore throat or runny nose.   CARDIOVASCULAR: No chest pain, palpitations.  RESPIRATORY: No cough, shortness of breath, PND or orthopnea.   GASTROINTESTINAL: No nausea, vomiting or diarrhea.   GENITOURINARY: No dysuria, frequency or urgency.   MUSCULOSKELETAL: No muscle pain or weakness.  SKIN: No rashes or other skin complaints.  NEUROLOGIC: No headaches, paresthesias, fasciculations, seizures.  PSYCHIATRIC: No disorder of thought or mood.   ENDOCRINE: No heat or cold intolerance, polyuria or polydipsia.   HEMATOLOGICAL: No easy bruising or bleeding.      Oncology History:    Oncology History Overview Note   Prior therapy:  FCR x6 at [facility] beginning in 06/2011 for rapid LDT, attained a CR     Relapse 05/2014     09/22/2015 bendamustine (90 mg/m2) began (no Ritux first cycle)  10/20/2015: BR, severe infusion reaction prompting MICU stay  11/17/15 BR, benda reduced to 70 mg/m2, ritux tolerated  01/2016 - 6th cycle BR completed   03/2016 PET consistent with response     Relapse in 2018     02/20/18 ibrutinib 140 mg daily started (dose rec by pharmacy due to CKD)     04/11/20 ibrutinib held when pt admitted for acute cerebellar infarct     04/26/20 admitted for ritux re-initiation with plan for 4 weeks in setting of rapid relapse (WBC up to 88K, PET not concerning for transformation)     05/10/20 chlorambucil started      07/12/20 cycle 3 ritux - cycle 4 declined     CLL (chronic lymphoid leukemia) in relapse (CMS-HCC)   2012 Initial Diagnosis    CLL (chronic lymphoid leukemia) in relapse (CMS-HCC)     08/04/2019 Biopsy    (Outside case: [id number], dated 08/04/2019)  Bone marrow, aspiration and biopsy  -  Normocellular bone marrow (30%) involved by chronic lymphocytic leukemia / small lymphocytic lymphoma, representing 40% of marrow cellularity by outside PAX5 immunohistochemistry       -  By outside report, cytogenetic studies were performed and interpreted as below:  Karyotype (per [facility] report): 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     09/21/2020 Biopsy      (Outside case: [id number], dated 09/21/2020)  Lymph node, left cervical, image-guided needle core biopsy  -  Chronic lymphocytic leukemia/small lymphocytic lymphoma (see Comment)  -  Negative for large cell transformation in the sampled core.         11/24/2020 -  Chemotherapy    Zanubrutinib 160 mg ONCE daily 02/24/2021 -  Chemotherapy    IP Zanubrutinib continuation  [No description for this plan]           Allergies   Allergen Reactions   ??? Lisinopril Cough and Other (See Comments)     Other reaction(s): Unknown         Current Outpatient Medications   Medication Sig Dispense Refill   ??? acetaminophen (TYLENOL) 500 MG tablet Take 2 tablets (1,000 mg total) by mouth every eight (8) hours. 30 tablet 0   ??? amLODIPine (NORVASC) 5 MG tablet Take 1 tablet (5 mg total) by mouth daily. 90 tablet 3   ??? atorvastatin (LIPITOR) 40 MG tablet Take 40 mg by mouth daily.     ??? carvediloL (COREG)  25 MG tablet Take 1 tablet (25 mg total) by mouth Two (2) times a day. 60 tablet 0   ??? diclofenac sodium (VOLTAREN) 1 % gel Apply 2 g topically four (4) times a day. 450 g 0   ??? doxercalciferoL 2 mcg/mL Soln Infuse into a venous catheter. Pt taking at dialysis-unknown dosage     ??? furosemide (LASIX) 80 MG tablet Take 1 tablet (80 mg total) by mouth daily. 30 tablet 0   ??? hydrALAZINE (APRESOLINE) 50 MG tablet Take 1 tablet (50 mg total) by mouth every six (6) hours. 120 tablet 0   ??? lidocaine (LIDODERM) 5 % patch Place 2 patches on the skin daily. Apply to affected area for 12 hours only each day (then remove patch) 60 patch 0   ??? losartan (COZAAR) 100 MG tablet Take 100 mg by mouth daily.     ??? melatonin 3 mg Tab Take 1 tablet (3 mg total) by mouth every evening.  0   ??? NIFEdipine (PROCARDIA XL) 90 MG 24 hr tablet Take 1 tablet (90 mg total) by mouth daily. 90 tablet 3   ??? oxyCODONE (ROXICODONE) 5 MG immediate release tablet Take 5 mg by mouth every four (4) hours as needed for pain.     ??? sertraline (ZOLOFT) 100 MG tablet Take 100 mg by mouth daily.     ??? zanubrutinib (BRUKINSA) capsule Take 2 capsules (160mg ) by mouth once daily three times a week after dialysis. Swallow whole with water. Do not open, break, or chew capsule. 24 capsule 5   ??? loratadine (CLARITIN) 10 mg tablet Take 1 tablet (10 mg total) by mouth daily. 30 tablet 2   ??? polyethylene glycol (MIRALAX) 17 gram packet Take 17 g by mouth daily as needed (constipation).  0     No current facility-administered medications for this visit.         Physical exam:  Vitals:    03/08/21 1147   BP: 140/66   Pulse: 84   Resp: 17   Temp: 36.4 ??C (97.5 ??F)   SpO2: 99%      General: Frail appearing, in wheelchair, resting in no apparent distress, accompanied by spouse  HEENT:  Clear sclera, conjunctiva, mask in place  LYMPH:  no palpable cervical, supraclavicular, axillary, or inguinal nodes  CARDAC: RRR, no R,M,Gs, no pitting peripheral edema  RESP: nonlabored, bilaterally CTA  GI: Soft, nontender, active bowel sounds, no hepatic or splenomegaly  NEURO: alert, 0x4, steady gait, no focal deficits  PSYCH: appropriate  DERM: no visible rashes, lesions        ECOG Performance Status: 3    Orders/Results:  Labs and pathology have been reviewed and pertinent results are as follows:    Results for orders placed or performed in visit on 03/08/21   Comprehensive Metabolic Panel   Result Value Ref Range    Sodium 136 135 - 145 mmol/L    Potassium 4.1 3.4 - 4.8 mmol/L    Chloride 101 98 - 107 mmol/L    Anion Gap 3 (L) 5 - 14 mmol/L    CO2 32.0 (H) 20.0 - 31.0 mmol/L    BUN 19 9 - 23 mg/dL    Creatinine 1.61 (H) 0.60 - 0.80 mg/dL    BUN/Creatinine Ratio 6     EGFR CKD-EPI Non-African American, Female 13 (L) >=60 mL/min/1.61m2    EGFR CKD-EPI African American, Female 15 (L) >=60 mL/min/1.37m2    Glucose 153 70 - 179 mg/dL  Calcium 9.5 8.7 - 10.4 mg/dL    Albumin 2.9 (L) 3.4 - 5.0 g/dL    Total Protein 5.7 5.7 - 8.2 g/dL    Total Bilirubin 0.3 0.3 - 1.2 mg/dL    AST 27 <=65 U/L    ALT 17 10 - 49 U/L    Alkaline Phosphatase 130 (H) 46 - 116 U/L   CBC w/ Differential   Result Value Ref Range    WBC 20.2 (H) 3.6 - 11.2 10*9/L    RBC 3.19 (L) 3.95 - 5.13 10*12/L    HGB 8.9 (L) 11.3 - 14.9 g/dL    HCT 78.4 (L) 69.6 - 44.0 %    MCV 88.4 77.6 - 95.7 fL    MCH 28.0 25.9 - 32.4 pg    MCHC 31.6 (L) 32.0 - 36.0 g/dL RDW 29.5 (H) 28.4 - 13.2 %    MPV 10.3 6.8 - 10.7 fL    Platelet 76 (L) 150 - 450 10*9/L    Neutrophils % 15.8 %    Lymphocytes % 78.7 %    Monocytes % 3.0 %    Eosinophils % 1.8 %    Basophils % 0.7 %    Absolute Neutrophils 3.2 1.8 - 7.8 10*9/L    Absolute Lymphocytes 15.9 (H) 1.1 - 3.6 10*9/L    Absolute Monocytes 0.6 0.3 - 0.8 10*9/L    Absolute Eosinophils 0.4 0.0 - 0.5 10*9/L    Absolute Basophils 0.1 0.0 - 0.1 10*9/L    Anisocytosis Marked (A) Not Present   Morphology Review   Result Value Ref Range    Smear Review Comments See Comment (A) Undefined         Diagnosis   Date Value Ref Range Status   02/24/2021   Final    Bone marrow, left iliac, aspiration and biopsy  -  Hypercellular bone marrow (60-70%) involved by chronic lymphocytic leukemia / small lymphocytic lymphoma, representing approximately 60% of marrow cellularity by flow cytometry and PAX5/CD20 immunohistochemical analysis  -  Scant residual trilineage hematopoiesis    -  See linked reports for associated Ancillary Studies.      This electronic signature is attestation that the pathologist personally reviewed the submitted material(s) and the final diagnosis reflects that evaluation.

## 2021-03-08 NOTE — Unmapped (Addendum)
Plan for once a week labs/transfusions. I will ask for the 5/26 appointment to be moved to 5/23 so it is on a Monday and fits with your other appointment at [facility]. We will adjust if needed.    Plan to see you back in 1 month to see how you are doing.    Will move the 5/26 appointment to another date.    Please return in 1 months for a follow up visit and labs.    Please call us if you experience:    1. Fever of 100.5 F or higher, shaking chills, drenching night sweats.  2. Any rapidly enlarging lymph node or mass  3. Unintentional weight loss  4. Any other concerning symptom     MyChart Messages  For your safety and best care, please DO NOT use MyChart messages to report symptoms. (Symptoms should be reported by calling the nurse triage line). Please use MyChart for non-urgent matters such as general questions, non-urgent prescription refills, or non-urgent scheduling issues.     ?? Please do not use MyChart for URGENT messages, as messages are only checked during regular business hours.     ?? Please note that MyChart messages may be routed a central pool and one of your provider???s team members will get back to you.  - Expect up to 3 business days for response     If you have any other questions, please do not hesitate to contact us.    Nurse Navigator: Covering hematologic malignancies navigator  Nurse Practitioner: Langley Gauss    For health related questions Monday through Friday 8 AM??? 5 PM : please call the office at [phone] and ask to speak with a nurse.  For appointment changes call: Main Clinic [phone].  Toll free number is [phone].    On Nights, Weekends and Holidays:  Call [phone] and ask for the adult hematologist/oncologist on call.      [facility]  [address]  [place], [place] [address]  [email or web address]    Results for orders placed or performed in visit on 03/08/21   Comprehensive Metabolic Panel   Result Value Ref Range    Sodium 136 135 - 145 mmol/L    Potassium 4.1 3.4 - 4.8 mmol/L    Chloride 101 98 - 107 mmol/L    Anion Gap 3 (L) 5 - 14 mmol/L    CO2 32.0 (H) 20.0 - 31.0 mmol/L    BUN 19 9 - 23 mg/dL    Creatinine 2.44 (H) 0.60 - 0.80 mg/dL    BUN/Creatinine Ratio 6     EGFR CKD-EPI Non-African American, Female 13 (L) >=60 mL/min/1.27m2    EGFR CKD-EPI African American, Female 15 (L) >=60 mL/min/1.88m2    Glucose 153 70 - 179 mg/dL    Calcium 9.5 8.7 - 01.0 mg/dL    Albumin 2.9 (L) 3.4 - 5.0 g/dL    Total Protein 5.7 5.7 - 8.2 g/dL    Total Bilirubin 0.3 0.3 - 1.2 mg/dL    AST 27 <=27 U/L    ALT 17 10 - 49 U/L    Alkaline Phosphatase 130 (H) 46 - 116 U/L   CBC w/ Differential   Result Value Ref Range    WBC 20.2 (H) 3.6 - 11.2 10*9/L    RBC 3.19 (L) 3.95 - 5.13 10*12/L    HGB 8.9 (L) 11.3 - 14.9 g/dL    HCT 25.3 (L) 66.4 - 44.0 %    MCV 88.4 77.6 - 95.7  fL    MCH 28.0 25.9 - 32.4 pg    MCHC 31.6 (L) 32.0 - 36.0 g/dL    RDW 62.9 (H) 52.8 - 15.2 %    MPV 10.3 6.8 - 10.7 fL    Platelet 76 (L) 150 - 450 10*9/L    Anisocytosis Marked (A) Not Present

## 2021-03-08 NOTE — Unmapped (Unsigned)
Peripheral stick done by ALinker, 25g R FA, labs drawn and sent.

## 2021-03-10 ENCOUNTER — Other Ambulatory Visit: Admit: 2021-03-10 | Discharge: 2021-03-10 | Payer: MEDICARE

## 2021-03-10 ENCOUNTER — Ambulatory Visit
Admit: 2021-03-10 | Discharge: 2021-03-10 | Payer: MEDICARE | Attending: Infectious Disease | Primary: Infectious Disease

## 2021-03-10 ENCOUNTER — Ambulatory Visit: Admit: 2021-03-10 | Discharge: 2021-03-10 | Payer: MEDICARE

## 2021-03-10 DIAGNOSIS — C9112 Chronic lymphocytic leukemia of B-cell type in relapse: Secondary | ICD-10-CM | POA: Diagnosis not present

## 2021-03-10 DIAGNOSIS — A630 Anogenital (venereal) warts: Secondary | ICD-10-CM | POA: Diagnosis not present

## 2021-03-10 DIAGNOSIS — A491 Streptococcal infection, unspecified site: Secondary | ICD-10-CM | POA: Diagnosis not present

## 2021-03-10 LAB — CBC W/ AUTO DIFF
BASOPHILS ABSOLUTE COUNT: 0.1 10*9/L (ref 0.0–0.1)
BASOPHILS RELATIVE PERCENT: 0.8 %
EOSINOPHILS ABSOLUTE COUNT: 0.2 10*9/L (ref 0.0–0.5)
EOSINOPHILS RELATIVE PERCENT: 1.5 %
HEMATOCRIT: 24.7 % — ABNORMAL LOW (ref 34.0–44.0)
HEMOGLOBIN: 8.1 g/dL — ABNORMAL LOW (ref 11.3–14.9)
LYMPHOCYTES ABSOLUTE COUNT: 10.8 10*9/L — ABNORMAL HIGH (ref 1.1–3.6)
LYMPHOCYTES RELATIVE PERCENT: 78.3 %
MEAN CORPUSCULAR HEMOGLOBIN CONC: 32.8 g/dL (ref 32.0–36.0)
MEAN CORPUSCULAR HEMOGLOBIN: 28.6 pg (ref 25.9–32.4)
MEAN CORPUSCULAR VOLUME: 87.4 fL (ref 77.6–95.7)
MEAN PLATELET VOLUME: 10.8 fL — ABNORMAL HIGH (ref 6.8–10.7)
MONOCYTES ABSOLUTE COUNT: 0.4 10*9/L (ref 0.3–0.8)
MONOCYTES RELATIVE PERCENT: 3.2 %
NEUTROPHILS ABSOLUTE COUNT: 2.2 10*9/L (ref 1.8–7.8)
NEUTROPHILS RELATIVE PERCENT: 16.2 %
PLATELET COUNT: 70 10*9/L — ABNORMAL LOW (ref 150–450)
RED BLOOD CELL COUNT: 2.82 10*12/L — ABNORMAL LOW (ref 3.95–5.13)
RED CELL DISTRIBUTION WIDTH: 21.9 % — ABNORMAL HIGH (ref 12.2–15.2)
WBC ADJUSTED: 13.7 10*9/L — ABNORMAL HIGH (ref 3.6–11.2)

## 2021-03-10 LAB — COMPREHENSIVE METABOLIC PANEL
ALBUMIN: 2.8 g/dL — ABNORMAL LOW (ref 3.4–5.0)
ALKALINE PHOSPHATASE: 110 U/L (ref 46–116)
ALT (SGPT): 17 U/L (ref 10–49)
ANION GAP: 6 mmol/L (ref 5–14)
AST (SGOT): 22 U/L (ref <=34)
BILIRUBIN TOTAL: 0.3 mg/dL (ref 0.3–1.2)
BLOOD UREA NITROGEN: 20 mg/dL (ref 9–23)
BUN / CREAT RATIO: 6
CALCIUM: 9.3 mg/dL (ref 8.7–10.4)
CHLORIDE: 98 mmol/L (ref 98–107)
CO2: 30 mmol/L (ref 20.0–31.0)
CREATININE: 3.26 mg/dL — ABNORMAL HIGH
EGFR CKD-EPI AA FEMALE: 16 mL/min/{1.73_m2} — ABNORMAL LOW (ref >=60)
EGFR CKD-EPI NON-AA FEMALE: 14 mL/min/{1.73_m2} — ABNORMAL LOW (ref >=60)
GLUCOSE RANDOM: 195 mg/dL — ABNORMAL HIGH (ref 70–179)
POTASSIUM: 4 mmol/L (ref 3.4–4.8)
PROTEIN TOTAL: 5.4 g/dL — ABNORMAL LOW (ref 5.7–8.2)
SODIUM: 134 mmol/L — ABNORMAL LOW (ref 135–145)

## 2021-03-10 LAB — SLIDE REVIEW

## 2021-03-10 NOTE — Unmapped (Unsigned)
IMMUNOCOMPROMISED HOST INFECTIOUS DISEASE PROGRESS NOTE    Assessment/Plan:     Ms.Cristina Fields is a 70 y.o. female who presents for hospital follow up.     ID Problem List:  CLL Dx 2012; relapsed in 05/2014, 2018, 04/2020  - Prior chemotherapy:??fludarabine, cyclophos, ritux, bendamustine, ibrutinib, chlorambucil  - Current chemotherapy: zanubrutinib - on hold??since admission  - Infection Prophylaxis:??none  ??  Infection History  # S.Bovis bacteremia 02/13/21  -4/25 BCx (PIV) S.bovis x 1 set (S: ceftri, PCN G, vanc; R: erythro, clinda)  -4/27 BCx BGTD  -4/27 TTE thickening of AV, MV, TV likely degen change, but cannot r/o endocarditis  -5/2 TEE no vegetations  ??  # Encephalopathy??favor toxic metabolic??02/13/21, improved  -1/61 MRI brain chronic cerebellar hemorrhages, no acute intracranial hemorrhage  ??  # Significant L clavicular head, L Lower sternocleidomastoid, L supraspinatus tenderness ~ early 01/2021 with CT showed non-specific inflammation  - 4/29 exam significant tenderness   - 5/2 CT neck Inflammatory stranding surrounding the left shoulder and axilla without organized or drainable fluid collection and mild reactive lymphadenopathy  - 5/3 discussed with [facility] rads who didn't feel the findings c/w osteomyelitis, probably non-specific inflammation  ??  Pertinent Co-morbidities  T2DM (A1c 5.6; 01/06/21)  ESRD on iHD  Rectal condyloma acuminata with HSIL/AIN3 01/30/21  Cognitive impairment??SLUMS score of 15/30??c/t dementia 01/29/21  ??  Drug Intolerances  none      Recommendations:  Needs outpatient colonoscopy to rule out GI malignancy I/s/o S.bovis BSI.   Needs outpatient PAP smear given condylomata    Cristina Fields is currently receiving drug therapy requiring intensive lab monitoring for toxicity.    Follow up ***    Subjective     Interval History:    I reviewed and summarized the medical records from hospitalization on *** which illustrated ***    Medications:  Antimicrobials:***  Prior/Current immunomodulators:***  Other medications reviewed.    Objective     Vital signs:  LMP  (LMP Unknown)     Physical Exam:  GEN:  {INF PE GEN APPEARANCE:[id number]}  EYES: {INF PE EYES:22665}  ENT:{INF PE WRU:[id number]}  LYMPH:{amllym:25980::no cervical LAD,no cervical or supraclavicular LAD,no cervical, supraclavicular, or axillary LAD,no cervical, supraclavicular, axillary, or inguinal LAD}  CV:{amlcv:25980::RRR,no abnormal heart sounds noted,no peripheral edema}  PULM:{amlpulm:25980::normal work of breathing at rest,CTAB anteriorly,CTAB posteriorly}  GI:{amlabd:25980::soft, NT,soft, NTND,no tenderness over renal allograft}  GU:{INF PE JY:[id number]}  RECTAL:{INF PE RECTAL:[id number]}  SKIN:{INF PE SKIN:[id number]}  MSK:{amlmsk:25980::no vertebral point tenderness,no swollen joints,full neck ROM}  NEURO:{amlneuro:25980::no tremor noted,facial expression symmetric,moves extremities equally}  PSYCH:{INF PE PSYCH:[id number]}    Labs:  Lab Results   Component Value Date    WBC 20.2 (H) 03/08/2021    WBC 20.2 (H) 03/07/2021    WBC 22.6 (H) 03/06/2021    HGB 8.9 (L) 03/08/2021    HCT 28.2 (L) 03/08/2021    Platelet 76 (L) 03/08/2021    Absolute Neutrophils 3.2 03/08/2021    Absolute Lymphocytes 15.9 (H) 03/08/2021    Absolute Eosinophils 0.4 03/08/2021    Sodium 136 03/08/2021    Potassium 4.1 03/08/2021    BUN 19 03/08/2021    Creatinine 3.34 (H) 03/08/2021    Creatinine 5.49 (H) 03/07/2021    Creatinine 4.04 (H) 03/06/2021    Glucose 153 03/08/2021    Magnesium 1.9 03/07/2021    Albumin 2.9 (L) 03/08/2021    Total Bilirubin 0.3 03/08/2021    AST 27 03/08/2021    ALT 17 03/08/2021    Alkaline  Phosphatase 130 (H) 03/08/2021    INR 1.30 03/04/2021       Microbiology:  Past cultures were reviewed in Epic and CareEverywhere.***    Review of cultures with microbiology: I discussed the microbiology with Dr. Marland Kitchen and ***.    Imaging:  5/16 non-contrast CT neck: 1.9 x 0.7 cm hyperattenuating soft tissue nodule in the right upper anterior chest at the level of previously placed CVC (6:61), likely represents developing seroma versus collection. Soft tissue edema and inflammatory stranding throughout the lower neck, upper chest and axilla. Multilevel degenerative changes of the spine with stable mild retrolisthesis of C4 on C5 and anterolisthesis of C5 on C6. Interval increase in large left pleural effusion and pulmonary edema. IMPRESSION:  Nonspecific mild increase in subcentimeter cervical chain lymph nodes measuring up to 0.8 cm, previously 0.6 cm. 1.9 cm hyperattenuating soft tissue nodule in the upper right anterior chest level previously placed CVC likely represents a developing seroma versus collection. Correlate with physical exam. Increased size of large left pleural effusion, pulmonary edema, and body wall edema.

## 2021-03-10 NOTE — Unmapped (Unsigned)
PIV placed by RN Charyl Bigger, labs drawn and sent.

## 2021-03-12 DIAGNOSIS — C9112 Chronic lymphocytic leukemia of B-cell type in relapse: Principal | ICD-10-CM

## 2021-03-13 ENCOUNTER — Ambulatory Visit: Admit: 2021-03-13 | Discharge: 2021-03-14 | Payer: MEDICARE

## 2021-03-13 ENCOUNTER — Other Ambulatory Visit: Admit: 2021-03-13 | Discharge: 2021-03-14 | Payer: MEDICARE

## 2021-03-13 ENCOUNTER — Ambulatory Visit
Admit: 2021-03-13 | Discharge: 2021-03-14 | Payer: MEDICARE | Attending: Student in an Organized Health Care Education/Training Program | Primary: Student in an Organized Health Care Education/Training Program

## 2021-03-13 DIAGNOSIS — C9112 Chronic lymphocytic leukemia of B-cell type in relapse: Secondary | ICD-10-CM | POA: Diagnosis not present

## 2021-03-13 LAB — CBC W/ AUTO DIFF
BASOPHILS ABSOLUTE COUNT: 0.1 10*9/L (ref 0.0–0.1)
BASOPHILS RELATIVE PERCENT: 0.7 %
EOSINOPHILS ABSOLUTE COUNT: 0.2 10*9/L (ref 0.0–0.5)
EOSINOPHILS RELATIVE PERCENT: 1.7 %
HEMATOCRIT: 26.1 % — ABNORMAL LOW (ref 34.0–44.0)
HEMOGLOBIN: 8.4 g/dL — ABNORMAL LOW (ref 11.3–14.9)
LYMPHOCYTES ABSOLUTE COUNT: 10.5 10*9/L — ABNORMAL HIGH (ref 1.1–3.6)
LYMPHOCYTES RELATIVE PERCENT: 73.2 %
MEAN CORPUSCULAR HEMOGLOBIN CONC: 32.3 g/dL (ref 32.0–36.0)
MEAN CORPUSCULAR HEMOGLOBIN: 28.2 pg (ref 25.9–32.4)
MEAN CORPUSCULAR VOLUME: 87.3 fL (ref 77.6–95.7)
MEAN PLATELET VOLUME: 10.2 fL (ref 6.8–10.7)
MONOCYTES ABSOLUTE COUNT: 0.6 10*9/L (ref 0.3–0.8)
MONOCYTES RELATIVE PERCENT: 4 %
NEUTROPHILS ABSOLUTE COUNT: 2.9 10*9/L (ref 1.8–7.8)
NEUTROPHILS RELATIVE PERCENT: 20.4 %
PLATELET COUNT: 80 10*9/L — ABNORMAL LOW (ref 150–450)
RED BLOOD CELL COUNT: 2.99 10*12/L — ABNORMAL LOW (ref 3.95–5.13)
RED CELL DISTRIBUTION WIDTH: 21.7 % — ABNORMAL HIGH (ref 12.2–15.2)
WBC ADJUSTED: 14.3 10*9/L — ABNORMAL HIGH (ref 3.6–11.2)

## 2021-03-13 LAB — COMPREHENSIVE METABOLIC PANEL
ALBUMIN: 3.1 g/dL — ABNORMAL LOW (ref 3.4–5.0)
ALKALINE PHOSPHATASE: 113 U/L (ref 46–116)
ALT (SGPT): 22 U/L (ref 10–49)
ANION GAP: 9 mmol/L (ref 5–14)
AST (SGOT): 25 U/L (ref <=34)
BILIRUBIN TOTAL: 0.3 mg/dL (ref 0.3–1.2)
BLOOD UREA NITROGEN: 35 mg/dL — ABNORMAL HIGH (ref 9–23)
BUN / CREAT RATIO: 8
CALCIUM: 9.8 mg/dL (ref 8.7–10.4)
CHLORIDE: 98 mmol/L (ref 98–107)
CO2: 26 mmol/L (ref 20.0–31.0)
CREATININE: 4.27 mg/dL — ABNORMAL HIGH
EGFR CKD-EPI (2021) FEMALE: 11 mL/min/{1.73_m2} — ABNORMAL LOW (ref >=60)
GLUCOSE RANDOM: 121 mg/dL (ref 70–179)
POTASSIUM: 4.5 mmol/L (ref 3.4–4.8)
PROTEIN TOTAL: 5.8 g/dL (ref 5.7–8.2)
SODIUM: 133 mmol/L — ABNORMAL LOW (ref 135–145)

## 2021-03-13 NOTE — Unmapped (Signed)
Patient is in the infusion center today for lab check. Results were reviewed with them in clinic with no interventions needed. They left the infusion center alert and ambulatory after their PIV was removed. AVS printed.

## 2021-03-13 NOTE — Unmapped (Signed)
Called patient and she states she had forgotten about visit today and was in the process of leaving the hospital.  Asked to be rescheduled.

## 2021-03-13 NOTE — Unmapped (Signed)
Lab Results   Component Value Date    WBC 14.3 (H) 03/13/2021    HGB 8.4 (L) 03/13/2021    HCT 26.1 (L) 03/13/2021    PLT 80 (L) 03/13/2021       Lab Results   Component Value Date    NA 133 (L) 03/13/2021    K 4.5 03/13/2021    CL 98 03/13/2021    CO2 26.0 03/13/2021    BUN 35 (H) 03/13/2021    CREATININE 4.27 (H) 03/13/2021    GLU 121 03/13/2021    CALCIUM 9.8 03/13/2021    MG 1.9 03/07/2021    PHOS 5.2 (H) 03/07/2021       Lab Results   Component Value Date    BILITOT 0.3 03/13/2021    BILIDIR 0.10 03/07/2021    PROT 5.8 03/13/2021    ALBUMIN 3.1 (L) 03/13/2021    ALT 22 03/13/2021    AST 25 03/13/2021    ALKPHOS 113 03/13/2021       Lab Results   Component Value Date    PT 15.2 (H) 03/04/2021    INR 1.30 03/04/2021    APTT 33.0 02/15/2021

## 2021-03-13 NOTE — Unmapped (Signed)
PIV inserted on 03/10/21.  Labs drawn with no complications by Marzella Schlein.  PIV flushed with saline.

## 2021-03-15 DIAGNOSIS — Z20822 Contact with and (suspected) exposure to covid-19: Secondary | ICD-10-CM | POA: Diagnosis not present

## 2021-03-22 DIAGNOSIS — C9112 Chronic lymphocytic leukemia of B-cell type in relapse: Principal | ICD-10-CM

## 2021-03-23 DIAGNOSIS — U071 COVID: Principal | ICD-10-CM

## 2021-03-23 NOTE — Unmapped (Signed)
[facility] Triage Note     Patient: Cristina Fields     Reason for call:  return call    Time call returned: 1610.     Phone Assessment: Spoke with [name]. Since Abryanna has been at the [facility] she has only had 2 dialysis treatments.  She has not been taking her cancer medicine because she is to do this after dialysis but no one will take her to dialyze her due to COVID.     Triage Recommendations: will reach out to team regarding missing the Brukinsa. Advised that he will need to have [facility] find a dialysis provider that will take COVID patients and reach out to her nephrologist to help.     Patient Response: [name] was grateful     Outstanding tasks: Team to follow up regarding patient Cancer medication     Patient Pharmacy has been verified and primary pharmacy has been marked as preferred

## 2021-03-23 NOTE — Unmapped (Signed)
Hi,     Cristina Fields contacted the [employer] regarding the following:    - Is wanting to let the care team know that she has not been able to keep her appts due to her contracting Covid; tested positive 03/15/21 and she hasn't been taking cancel meds due to her not having dialysis; needing direction on how to proceed     Please contact [name] at [phone].    Thanks in advance,    Keturah Shavers  [facility] Cancer Communication Center   [phone]

## 2021-03-23 NOTE — Unmapped (Signed)
I reached Cristina Fields to see how she is doing. She stated I am here in this nursing home and I have Covid.  [name] has it too so he cant come visit me.     We disucssed that she received Evusheld on May 23 so it is hopeful that the pre-exposure treatment will ease her course.    I told her we will ask if there is anything else she can get to help.    She appreciates Dr. Lonni Fix' concern and thanked me for the call.

## 2021-03-23 NOTE — Unmapped (Signed)
COVID Infusion Therapy Questionnaire    Have you received a positive COVID test result (excluding antibody testing) AND are currently in an outpatient setting? Yes  Do you have at least one mild or moderate COVID symptom? Yes  What date did you test COVID+ (mm/dd/yyyy): 03/11/21  What date did your symptoms start (mm/dd/yyyy): 03/11/21      Resolution  Patient does not qualify - You do not meet the prioritized eligibility criteria for monoclonal antibody therapy at [facility] at this time. To avoid spreading the virus: Cough or sneeze into a tissue and then throw the tissue away. If you don't have a tissue, use your hand to cover your cough or sneeze and then clean your hand. You can also cough into your sleeve. Wash your hands often with soap and warm water for 15 to 20 seconds each time. If you don't have soap and water near you, you can clean your hands with alcohol wipes or gel. Call your doctor now or seek immediate medical care if: you have a new or higher fever, your fever lasts more than 48 hours, you have trouble breathing, you have a fever with a stiff neck or a severe headache, you are sensitive to light or you feel very sleepy or confused.  Advised good hand hygiene, physical distancing, to remain home as much as possible and to call PCP or [facility] Nurse Connect at [phone] with new or worsening symptoms. Patient verbalizes understanding.  Patient Does not qualify.  Patient County: [place]

## 2021-03-23 NOTE — Unmapped (Signed)
Hi,     Calisa contacted the [employer] regarding the following:    - States that she is COVID + and needs to reschedule her appointments for 03/24/21 and 03/27/21.     Please contact Averi at [phone].    Thanks in advance,    Rosary Lively  [facility] Cancer Communication Center   [phone]

## 2021-03-24 NOTE — Unmapped (Signed)
I was asked to check on this patient and her husband.  The patient was at her dialysis appointment Friday afternoon and asked that I call her back Monday.  Her spouse was at the [place] center in [place] and spoke to me briefly for 10 minutes.  He expressed that his wife has Covid and he tested positive too.  Transportation was an issue today for her and when I contacted the [facility], where she is in rehab, they said the husband transported the patient himself. The husband told me he was working on who would transport her back.  When I called the [facility] I was told they were unsure of what he was doing.   The husband mentioned to me he may possibly take the patient back home to [facility].    Alonza Smoker  Oncology Patient Navigator  [phone]

## 2021-03-24 NOTE — Unmapped (Signed)
1123 spoke with [name], they are going to take her to dialysis today. Advised that he follow up with staff at [facility] to give her [facility] after treatment. He stated that they have the medication he brought in. He was concerned because she has been feeling cold with the COVID. Advised he have the nurse go in and get a temperature on her and evaluate how she is doing.  Patient sounded very weak and coughing with his COVID. Advised he call his PCP to see if he could get Paxlovid to short the length and help with symptoms. Advised his to take care of himself take in plenty of liquids and OTC cold and flu medication to help. He was thankful and verbalized understanding.    Inbasket to Orlin Hilding of patient outreach to touch base with patients husband during this trying time for support.

## 2021-03-27 NOTE — Unmapped (Signed)
I made a follow up emotional support call to the patient's husband, Evaly Ginger.  I also let him know the patient, his wife, got two awards from the [employer].  A $100 stipend from the patient aid program and $500 from the urgent need program were approved today.  Both checks will be mailed in about a week.    Alonza Smoker  Oncology Patient Navigator  [phone]

## 2021-03-28 NOTE — Unmapped (Signed)
[facility] Refill Coordination Note    Specialty Medication(s) to be Shipped:   Hematology/Oncology: Brukinsa    Other medication(s) to be shipped: No additional medications requested for fill at this time     Cristina Fields, DOB: 1950/12/13  Phone: [phone] (home)       All above HIPAA information was verified with patient.     Was a Nurse, learning disability used for this call? No    Completed refill call assessment today to schedule patient's medication shipment from the [facility] [phone]).  All relevant notes have been reviewed.     Specialty medication(s) and dose(s) confirmed: Regimen is correct and unchanged.   Changes to medications: [name] reports no changes at this time.  Changes to insurance: No  New side effects reported not previously addressed with a pharmacist or physician: None reported  Questions for the pharmacist: No    Confirmed patient received a Conservation officer, historic buildings and a Surveyor, mining with first shipment. The patient will receive a drug information handout for each medication shipped and additional FDA Medication Guides as required.       DISEASE/MEDICATION-SPECIFIC INFORMATION        N/A    SPECIALTY MEDICATION ADHERENCE     Medication Adherence    Patient reported X missed doses in the last month: 0  Specialty Medication: Brukinsa  Patient is on additional specialty medications: No              Were doses missed due to medication being on hold? No    Brukinsa   capsule: 5 days of medicine on hand        REFERRAL TO PHARMACIST     Referral to the pharmacist: Not needed      [facility]     Shipping address confirmed in Epic.     Delivery Scheduled: Yes, Expected medication delivery date: 03/30/21.     Medication will be delivered via UPS to the prescription address in Epic WAM.    Unk Lightning   [facility] Specialty Technician

## 2021-03-29 MED FILL — BRUKINSA 80 MG CAPSULE: 28 days supply | Qty: 24 | Fill #1

## 2021-03-30 NOTE — Unmapped (Signed)
[facility] Triage Note     Patient: Cristina Fields     Reason for call:  return call    Time call returned: 1545     Phone Assessment: pt husband calling very concerned because his wife has not been getting her cancer medications.  She is currently admitted to the [facility] hospital in [facility] and Dr Loel Dubonnet called Cristina Fields earlier today asking him to bring the medications to the hospital.  Cristina Fields reports that Dr Loel Dubonnet stated he would need to speak with Dr Lonni Fix to see if she even wanted the pt to continue the meds at this time.  Cristina Fields states the doctors are telling him that Cristina Fields is getting better but he says she sounds really bad to him.    RN explained that sometimes, the biggest focus is on getting the pt over an illness, such as covid, and then worrying about the cancer again as the medications can weaken the immune system and the pt needs all the strength they can give her.  RN tried to assure him that missing the medication a few times is ok.    Cristina Fields is asking if Dr Lonni Fix could call over and speak to Dr Irene Pap to discuss what she feels needs to happen.  RN advised Cristina Fields that Dr Loel Dubonnet will call over here for Dr Lonni Fix if he needed to speak with her, but Cristina Fields insisted Dr Loel Dubonnet asked him to get in contact with Dr Lonni Fix.  RN will alert team.

## 2021-03-30 NOTE — Unmapped (Signed)
Hi,     Cristina Fields contacted the [employer] requesting to speak with the care team of Cristina Fields to discuss:    Patient currently admitted to [facility] has not kept up with her cancer prescriptions while admitted.    Please contact Mr. Mcmasters at [phone].    Thank you,   Kelli Hope  [facility] Cancer Communication Center   [phone]

## 2021-03-31 ENCOUNTER — Ambulatory Visit: Admit: 2021-03-31 | Payer: MEDICARE

## 2021-04-03 ENCOUNTER — Ambulatory Visit
Admit: 2021-04-03 | Discharge: 2021-04-04 | Payer: MEDICARE | Attending: Student in an Organized Health Care Education/Training Program | Primary: Student in an Organized Health Care Education/Training Program

## 2021-04-03 ENCOUNTER — Ambulatory Visit: Admit: 2021-04-03 | Discharge: 2021-04-04 | Payer: MEDICARE

## 2021-04-03 DIAGNOSIS — Z515 Encounter for palliative care: Principal | ICD-10-CM

## 2021-04-03 MED ORDER — HYDRALAZINE 25 MG TABLET
0 days
Start: 2021-04-03 — End: ?

## 2021-04-03 MED ORDER — INSULIN ASPART (U-100) 100 UNIT/ML (3 ML) SUBCUTANEOUS PEN
0 days
Start: 2021-04-03 — End: ?

## 2021-04-03 MED ORDER — ALBUTEROL SULFATE HFA 90 MCG/ACTUATION AEROSOL INHALER
0 days
Start: 2021-04-03 — End: ?

## 2021-04-03 NOTE — Unmapped (Signed)
Patient did not show for appointment today.  On chart review, it appears as though she is hospitalized at an outside hospital.

## 2021-04-04 NOTE — Unmapped (Signed)
This pt has an appt scheduled with Gregary Signs on Wednesday 6/15.  Do we want to keep that one and schedule one for Friday?

## 2021-04-04 NOTE — Unmapped (Signed)
I called Mr. Robards to touch base following Ms. Tudisco's hospitalization. There was no answer so I left a voicemail. I suggested we set up a clinic visit on Friday since I have been unable to see her as her dialysis schedule conflicts with my usual clinic day.

## 2021-04-05 ENCOUNTER — Ambulatory Visit: Admit: 2021-04-05 | Payer: MEDICARE | Attending: Adult Health | Primary: Adult Health

## 2021-04-05 NOTE — Unmapped (Signed)
Cristina Fields patient husband contacted the Communication Center regarding the following:    - [name] called stated they are returning your call regarding appointment.    Please contact [name] at [phone].    Thanks in advance,    [name]  [facility] Cancer Communication Center   [phone]

## 2021-04-05 NOTE — Unmapped (Signed)
Hi,     Cristina Fields contacted the [employer] regarding the following:    - States that he would like to speak with you. He wasn't aware of Ermelinda's appointment today with Ellouise Newer and would like to see if she can be r/s for another day this week.     Please contact Cristina Fields at [phone].    Thanks in advance,    Rosary Lively  [facility] Cancer Communication Center   [phone]

## 2021-04-05 NOTE — Unmapped (Signed)
Returned to call of Mr. [name] but no answer, to reschedule the follow up with Dellis Anes.

## 2021-04-06 NOTE — Unmapped (Signed)
I spoke with patient Cristina Fields to confirm appointments on the following date(s): 04/07/2021.  Pt confirmed date and times.    Danford Bad

## 2021-04-07 ENCOUNTER — Ambulatory Visit
Admit: 2021-04-07 | Discharge: 2021-04-07 | Payer: MEDICARE | Attending: Hematology & Oncology | Primary: Hematology & Oncology

## 2021-04-07 ENCOUNTER — Other Ambulatory Visit: Admit: 2021-04-07 | Discharge: 2021-04-07 | Payer: MEDICARE

## 2021-04-07 DIAGNOSIS — C9112 Chronic lymphocytic leukemia of B-cell type in relapse: Secondary | ICD-10-CM | POA: Diagnosis not present

## 2021-04-07 LAB — COMPREHENSIVE METABOLIC PANEL
ALBUMIN: 3.4 g/dL (ref 3.4–5.0)
ALKALINE PHOSPHATASE: 100 U/L (ref 46–116)
ALT (SGPT): 25 U/L (ref 10–49)
ANION GAP: 8 mmol/L (ref 5–14)
AST (SGOT): 34 U/L (ref <=34)
BILIRUBIN TOTAL: 0.4 mg/dL (ref 0.3–1.2)
BLOOD UREA NITROGEN: 25 mg/dL — ABNORMAL HIGH (ref 9–23)
BUN / CREAT RATIO: 8
CALCIUM: 8.7 mg/dL (ref 8.7–10.4)
CHLORIDE: 100 mmol/L (ref 98–107)
CO2: 28 mmol/L (ref 20.0–31.0)
CREATININE: 3.11 mg/dL — ABNORMAL HIGH
EGFR CKD-EPI (2021) FEMALE: 16 mL/min/{1.73_m2} — ABNORMAL LOW (ref >=60)
GLUCOSE RANDOM: 130 mg/dL (ref 70–179)
POTASSIUM: 4.4 mmol/L (ref 3.4–4.8)
PROTEIN TOTAL: 5.9 g/dL (ref 5.7–8.2)
SODIUM: 136 mmol/L (ref 135–145)

## 2021-04-07 LAB — CBC W/ AUTO DIFF
BASOPHILS ABSOLUTE COUNT: 0.2 10*9/L — ABNORMAL HIGH (ref 0.0–0.1)
BASOPHILS RELATIVE PERCENT: 0.3 %
EOSINOPHILS ABSOLUTE COUNT: 0.2 10*9/L (ref 0.0–0.5)
EOSINOPHILS RELATIVE PERCENT: 0.3 %
HEMATOCRIT: 26.5 % — ABNORMAL LOW (ref 34.0–44.0)
HEMOGLOBIN: 8.4 g/dL — ABNORMAL LOW (ref 11.3–14.9)
LYMPHOCYTES ABSOLUTE COUNT: 58 10*9/L — ABNORMAL HIGH (ref 1.1–3.6)
LYMPHOCYTES RELATIVE PERCENT: 88.7 %
MEAN CORPUSCULAR HEMOGLOBIN CONC: 31.6 g/dL — ABNORMAL LOW (ref 32.0–36.0)
MEAN CORPUSCULAR HEMOGLOBIN: 28.4 pg (ref 25.9–32.4)
MEAN CORPUSCULAR VOLUME: 89.8 fL (ref 77.6–95.7)
MEAN PLATELET VOLUME: 9.6 fL (ref 6.8–10.7)
MONOCYTES ABSOLUTE COUNT: 1 10*9/L — ABNORMAL HIGH (ref 0.3–0.8)
MONOCYTES RELATIVE PERCENT: 1.5 %
NEUTROPHILS ABSOLUTE COUNT: 6 10*9/L (ref 1.8–7.8)
NEUTROPHILS RELATIVE PERCENT: 9.2 %
PLATELET COUNT: 55 10*9/L — ABNORMAL LOW (ref 150–450)
RED BLOOD CELL COUNT: 2.95 10*12/L — ABNORMAL LOW (ref 3.95–5.13)
RED CELL DISTRIBUTION WIDTH: 21.1 % — ABNORMAL HIGH (ref 12.2–15.2)
WBC ADJUSTED: 65.3 10*9/L (ref 3.6–11.2)

## 2021-04-07 LAB — SLIDE REVIEW

## 2021-04-07 NOTE — Unmapped (Signed)
Peripheral stick done by DMaxwell, 23g R FA, labs drawn and sent.

## 2021-04-07 NOTE — Unmapped (Signed)
Follow Up Visit Note    Patient Name: Cristina Fields  Patient Age: 70 y.o.  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 female who presents for follow up of relapsed/refractory CLL.    Therapy: zanubrutinib 160mg  ONCE daily after dialysis on dialysis days- chosen as safest option with dialysis and PPI use. See 1/14 note for discussion of treatment options.  Treatment indication: significant drenching night sweats, anemia and thrombocytopenia.    She has now been on zanubrutinib for 4+ months though with (appropriate) holds in setting of recent COVID infection. Anemia and thrombocytopenia have worsened. Her course has been complicated by 3 hospitalizations, most recently for strep bovis bacteremia and COVID, and she is quite frail overall.     I discussed that, acknowledging a number of dose holds, that the zanu does not appear to be leading to a clinical benefit and I am concerned further use will lead to more harm than benefit (primarily increased infectious risk).    I recommended therefore to discontinue the zanubrutinib. Considering her overall frailty, I discussed that I think hospice would be a good option for her. She is amenable to this and will ask team to help arrange.    Plans and Recommendations:  1. CLL  - discontinue zanubrutinib  - at this time due to frailty and significant other comorbidities, I do not think further CLL treatment would be of overall benefit to her so I recommended consideration of hospice    2. Anemia/thrombocytopenia - multifactorial - CLL and renal failure vs other (?zanu tox?).   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 Health maintenance  - vaccines:  Has gotten 3 covid vaccines and seasonal flu vaccine  - Received Evusheld 11/04/2020  Due to this patient's diagnosis, she is at significant risk for subsequent morbidity and/or mortality.    These services include the following elements of medical decision making:  addressing a hematologic cancer that poses a threat to life and/or bone marrow function  interpretation of diagnostic test reports or ordering of diagnostic tests and independent interpretation of diagnostic tests    I personally spent 40 minutes face-to-face and non-face-to-face in the care of this patient, which includes all pre, intra, and post visit time on the date of service.      History of Present Illness:     Cristina Fields is a 70 y.o. female who is seen in consultation at the request of Self, Referred for follow up of CLL/SLL.    Interim History  She has been hospitalized at [facility] with COVID. Missed a few dialysis sessions due to the COVID while at nursing facility but was dialyzed while inpt.    Feeling very tired today, complains of always feeling cold. Has a bit of pain at condyloma site.    REVIEW OF SYSTEMS:   CONSTITUTIONAL: No fevers, chills, + weight loss,+ drenching night sweats.   HEENT: Eyes: No blurred  vision. ENT: No earache, sore throat or runny nose.   CARDIOVASCULAR: No chest pain, palpitations.  RESPIRATORY: No cough, shortness of breath, PND or orthopnea.   GASTROINTESTINAL: No nausea, vomiting or diarrhea.   GENITOURINARY: No dysuria, frequency or urgency.   MUSCULOSKELETAL: No muscle pain or weakness.  SKIN: No rashes or other skin complaints.  NEUROLOGIC: No headaches, paresthesias, fasciculations, seizures.  PSYCHIATRIC: No disorder of thought or mood.   ENDOCRINE: No heat or cold intolerance, polyuria or polydipsia. HEMATOLOGICAL: No easy bruising or bleeding.      Oncology History:    Oncology History Overview Note   Prior therapy:  FCR x6 at [facility] beginning in 06/2011 for rapid LDT, attained a CR     Relapse 05/2014     09/22/2015 bendamustine (90 mg/m2) began (no Ritux first cycle)  10/20/2015: BR, severe infusion reaction prompting MICU stay  11/17/15 BR, benda reduced to 70 mg/m2, ritux tolerated  01/2016 - 6th cycle BR completed   03/2016 PET consistent with response     Relapse in 2018     02/20/18 ibrutinib 140 mg daily started (dose rec by pharmacy due to CKD)     04/11/20 ibrutinib held when pt admitted for acute cerebellar infarct     04/26/20 admitted for ritux re-initiation with plan for 4 weeks in setting of rapid relapse (WBC up to 88K, PET not concerning for transformation)     05/10/20 chlorambucil started      07/12/20 cycle 3 ritux - cycle 4 declined     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     09/21/2020 Biopsy      (Outside case: [id number], dated 09/21/2020)  Lymph node, left cervical, image-guided needle core biopsy  -  Chronic lymphocytic leukemia/small lymphocytic lymphoma (see Comment)  -  Negative for large cell transformation in the sampled core.         11/24/2020 -  Chemotherapy    Zanubrutinib 160 mg ONCE daily     02/24/2021 - Chemotherapy    IP Zanubrutinib continuation  [No description for this plan]           Allergies   Allergen Reactions   ??? Lisinopril Cough and Other (See Comments)     Other reaction(s): Unknown         Current Outpatient Medications   Medication Sig Dispense Refill   ??? acetaminophen (TYLENOL) 500 MG tablet Take 2 tablets (1,000 mg total) by mouth every eight (8) hours. 30 tablet 0   ??? amLODIPine (NORVASC) 5 MG tablet Take 1 tablet (5 mg total) by mouth daily. 90 tablet 3   ??? atorvastatin (LIPITOR) 40 MG tablet Take 40 mg by mouth daily.     ??? carvediloL (COREG) 25 MG tablet Take 1 tablet (25 mg total) by mouth Two (2) times a day. 60 tablet  0   ??? diclofenac sodium (VOLTAREN) 1 % gel Apply 2 g topically four (4) times a day. 450 g 0   ??? doxercalciferoL 2 mcg/mL Soln Infuse into a venous catheter. Pt taking at dialysis-unknown dosage     ??? furosemide (LASIX) 80 MG tablet Take 1 tablet (80 mg total) by mouth daily. 30 tablet 0   ??? hydrALAZINE (APRESOLINE) 50 MG tablet Take 1 tablet (50 mg total) by mouth every six (6) hours. 120 tablet 0   ??? loratadine (CLARITIN) 10 mg tablet Take 1 tablet (10 mg total) by mouth daily. 30 tablet 2   ??? losartan (COZAAR) 100 MG tablet Take 100 mg by mouth daily.     ??? melatonin 3 mg Tab Take 1 tablet (3 mg total) by mouth every evening.  0   ??? NIFEdipine (PROCARDIA XL) 90 MG 24 hr tablet Take 1 tablet (90 mg total) by mouth daily. 90 tablet 3   ??? oxyCODONE (ROXICODONE) 5 MG immediate release tablet Take 5 mg by mouth every four (4) hours as needed for pain.     ??? sertraline (ZOLOFT) 100 MG tablet Take 100 mg by mouth daily.     ??? zanubrutinib (BRUKINSA) capsule Take 2 capsules (160mg ) by mouth once daily three times a week after dialysis. Swallow whole with water. Do not open, break, or chew capsule. 24 capsule 5     No current facility-administered medications for this visit.         Physical exam:  Vitals:    04/07/21 1249   BP: 116/57   Pulse: 74   Resp: 15   Temp: 36.9 ??C (98.4 ??F) SpO2: 100%      General: Frail appearing, in wheelchair, resting in no apparent distress, accompanied by spouse  HEENT:  Clear sclera, conjunctiva, mask in place  LYMPH:  no palpable cervical, supraclavicular, axillary, or inguinal nodes  CARDAC: RRR, no R,M,Gs, no pitting peripheral edema  RESP: nonlabored, bilaterally CTA  GI: Soft, nontender, active bowel sounds, no hepatic or splenomegaly  NEURO: alert, 0x4, steady gait, no focal deficits  PSYCH: appropriate  DERM: no visible rashes, lesions        ECOG Performance Status: 3    Orders/Results:  Labs and pathology have been reviewed and pertinent results are as follows:    Results for orders placed or performed in visit on 04/07/21   Comprehensive Metabolic Panel   Result Value Ref Range    Sodium 136 135 - 145 mmol/L    Potassium 4.4 3.4 - 4.8 mmol/L    Chloride 100 98 - 107 mmol/L    CO2 28.0 20.0 - 31.0 mmol/L    Anion Gap 8 5 - 14 mmol/L    BUN 25 (H) 9 - 23 mg/dL    Creatinine 1.61 (H) 0.60 - 0.80 mg/dL    BUN/Creatinine Ratio 8     eGFR CKD-EPI (2021) Female 16 (L) >=60 mL/min/1.74m2    Glucose 130 70 - 179 mg/dL    Calcium 8.7 8.7 - 09.6 mg/dL    Albumin 3.4 3.4 - 5.0 g/dL    Total Protein 5.9 5.7 - 8.2 g/dL    Total Bilirubin 0.4 0.3 - 1.2 mg/dL    AST 34 <=04 U/L    ALT 25 10 - 49 U/L    Alkaline Phosphatase 100 46 - 116 U/L   CBC w/ Differential   Result Value Ref Range    WBC 65.3 (HH) 3.6 - 11.2 10*9/L  RBC 2.95 (L) 3.95 - 5.13 10*12/L    HGB 8.4 (L) 11.3 - 14.9 g/dL    HCT 16.1 (L) 09.6 - 44.0 %    MCV 89.8 77.6 - 95.7 fL    MCH 28.4 25.9 - 32.4 pg    MCHC 31.6 (L) 32.0 - 36.0 g/dL    RDW 04.5 (H) 40.9 - 15.2 %    MPV 9.6 6.8 - 10.7 fL    Platelet 55 (L) 150 - 450 10*9/L    Neutrophils % 9.2 %    Lymphocytes % 88.7 %    Monocytes % 1.5 %    Eosinophils % 0.3 %    Basophils % 0.3 %    Absolute Neutrophils 6.0 1.8 - 7.8 10*9/L    Absolute Lymphocytes 58.0 (H) 1.1 - 3.6 10*9/L    Absolute Monocytes 1.0 (H) 0.3 - 0.8 10*9/L    Absolute Eosinophils 0.2 0.0 - 0.5 10*9/L    Absolute Basophils 0.2 (H) 0.0 - 0.1 10*9/L    Anisocytosis Moderate (A) Not Present         Diagnosis   Date Value Ref Range Status   02/24/2021   Final    Bone marrow, left iliac, aspiration and biopsy  -  Hypercellular bone marrow (60-70%) involved by chronic lymphocytic leukemia / small lymphocytic lymphoma, representing approximately 60% of marrow cellularity by flow cytometry and PAX5/CD20 immunohistochemical analysis  -  Scant residual trilineage hematopoiesis    -  See linked reports for associated Ancillary Studies.      This electronic signature is attestation that the pathologist personally reviewed the submitted material(s) and the final diagnosis reflects that evaluation.

## 2021-04-10 NOTE — Unmapped (Signed)
I called Cristina Fields in follow up to his Friday conversation with Dr Lonni Fix where they talked about seeking hospice support.    I asked Cristina Fields if he has discussed stopping cancer treatments with the staff at the [facility]?     He said they mentioned it to a nurse yesterday but also said that he and his wife talked and she wants to live and feel that topping the cancer mediation is giving up.  He said is wife is feeling well, comparatively and described a weekend where they went to [employer] and to an [employer] where she ate whatever she wanted, and ate most of it.  She also got a Forensic scientist, which brought her joy.    Cristina Fields states he sent Dr. Lonni Fix a message, but I do not see it in her in-basket.     We spent some time talking about his challenges in getting her home, because when she comes home she sits in one place and does not move. They miss each other and the gas to visit is very expensive.  However he sees her doing well there.  [facility],  is inferior [facility].     I revisited the stopping the cancer medication.  I explained that sometimes medication can do harm or make her feel worse. Stopping this medication is not giving up, but may take away something that makes her feel bad.  Certainly the consistent diallyls is helping her.      Cristina Fields repeated that they fear stopping the cancer medication means that nothing is fighting the cancer and they do not want to give up.    I explained that I will ask Dr. Lonni Fix or Langley Gauss to call and explain the information discussed on Friday.      Cristina Fields understand that he needs to talk to the staff at the [facility] if things change and they decide to pursue hospice.

## 2021-04-10 NOTE — Unmapped (Signed)
I received a Mychart message after our visit on Friday where I recommended discontinuation of zanubrutinib due to increased risk for infection and suboptimal activity for pt - and to consider hospice.     Providing correspondence summary  so team aware of this update:    Her husband reports that she did well that day with improved energy and is not ready to stop cancer therapy - asked if they could continue the zanubrutinib. I said my concern remains the same regarding the risk for increased infections (and the CLL is only one of several issues causing her decline) but that if that is their preference, that this is acceptable. Therefore it seems hospice may not be appropriate at this time    I asked for [place] to reassess when she has around 2 weeks left to decide about reordering (or not).

## 2021-04-12 ENCOUNTER — Ambulatory Visit: Admit: 2021-04-12 | Discharge: 2021-04-12 | Payer: MEDICARE

## 2021-04-12 DIAGNOSIS — A630 Anogenital (venereal) warts: Secondary | ICD-10-CM | POA: Diagnosis not present

## 2021-04-12 DIAGNOSIS — R161 Splenomegaly, not elsewhere classified: Secondary | ICD-10-CM | POA: Diagnosis not present

## 2021-04-12 DIAGNOSIS — K6289 Other specified diseases of anus and rectum: Secondary | ICD-10-CM | POA: Diagnosis not present

## 2021-04-12 DIAGNOSIS — C9112 Chronic lymphocytic leukemia of B-cell type in relapse: Secondary | ICD-10-CM | POA: Diagnosis not present

## 2021-04-12 DIAGNOSIS — N261 Atrophy of kidney (terminal): Secondary | ICD-10-CM | POA: Diagnosis not present

## 2021-04-12 DIAGNOSIS — N281 Cyst of kidney, acquired: Secondary | ICD-10-CM | POA: Diagnosis not present

## 2021-04-12 DIAGNOSIS — Z01818 Encounter for other preprocedural examination: Secondary | ICD-10-CM | POA: Diagnosis not present

## 2021-04-12 DIAGNOSIS — M6289 Other specified disorders of muscle: Secondary | ICD-10-CM | POA: Diagnosis not present

## 2021-04-12 MED ADMIN — gadobenate dimeglumine (MULTIHANCE) 529 mg/mL (0.1mmol/0.2mL) solution 5 mL: 5 mL | INTRAVENOUS | @ 22:00:00 | Stop: 2021-04-12

## 2021-04-13 NOTE — Unmapped (Signed)
""  VENOUS ACCESS TEAM PROCEDURE    Order was placed for a \""PIV by Venous Access Team (VAT)\"".  Patient was assessed at bedside for placement of a PIV. PPE were donned per protocol.  Access was obtained. Blood return noted.  Dressing intact and device well secured.  Flushed with normal saline.  See LDA for details.  Pt advised to inform RN of any s/s of discomfort at the PIV site.    Workup / Procedure Time:  30 minutes      RN was notified.       Thank you,     Tyne Banta RN Venous Access Team""

## 2021-04-14 DIAGNOSIS — K629 Disease of anus and rectum, unspecified: Principal | ICD-10-CM

## 2021-04-14 DIAGNOSIS — D490 Neoplasm of unspecified behavior of digestive system: Principal | ICD-10-CM

## 2021-04-14 NOTE — Unmapped (Addendum)
Spoke with Mr. Coto about MRI results ordered during her April admission (completed 04/12/21) for evaluation of her anal mass possible surgical evaluation. Limited reading due to motion artifact and may need to be repeated if within her goals of care.     Discussed with Mr. 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 Mrs. Haq and her Onc (Dr. Lonni Fix) care team are having ongoing goals of care conversations, so we discussed that we would only pursue what was within her goals of care.     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 Will place a referral for luminal GI for surveillance monitoring, if remains within goals of care.     Multiple renal cysts bilaterally, possibly hemorrhagic, but no suspicious enhancing renal lesions. Will notify results PCP and nephrologist about cysts through [facility] via fax for continued monitoring. Small focal splenic infarct, possibly seen on PET scan from 2021. May be sequelae of CLL. Not on anticoagulation due to zanubrutinib.    Shared with Mr. Storlie that I've reached out to Dr. Lonni Fix [facility] On[facility]) to FYI her of results. He had no additional questions.

## 2021-04-17 ENCOUNTER — Ambulatory Visit: Admit: 2021-04-17 | Discharge: 2021-04-17 | Payer: MEDICARE

## 2021-04-17 ENCOUNTER — Other Ambulatory Visit: Admit: 2021-04-17 | Discharge: 2021-04-17 | Payer: MEDICARE

## 2021-04-17 DIAGNOSIS — C9112 Chronic lymphocytic leukemia of B-cell type in relapse: Secondary | ICD-10-CM | POA: Diagnosis not present

## 2021-04-17 LAB — COMPREHENSIVE METABOLIC PANEL
ALBUMIN: 3.1 g/dL — ABNORMAL LOW (ref 3.4–5.0)
ALKALINE PHOSPHATASE: 88 U/L (ref 46–116)
ALT (SGPT): 20 U/L (ref 10–49)
ANION GAP: 11 mmol/L (ref 5–14)
AST (SGOT): 23 U/L (ref <=34)
BILIRUBIN TOTAL: 0.3 mg/dL (ref 0.3–1.2)
BLOOD UREA NITROGEN: 50 mg/dL — ABNORMAL HIGH (ref 9–23)
BUN / CREAT RATIO: 9
CALCIUM: 8.5 mg/dL — ABNORMAL LOW (ref 8.7–10.4)
CHLORIDE: 104 mmol/L (ref 98–107)
CO2: 25 mmol/L (ref 20.0–31.0)
CREATININE: 5.49 mg/dL — ABNORMAL HIGH
EGFR CKD-EPI (2021) FEMALE: 8 mL/min/{1.73_m2} — ABNORMAL LOW (ref >=60)
GLUCOSE RANDOM: 317 mg/dL — ABNORMAL HIGH (ref 70–179)
POTASSIUM: 3.8 mmol/L (ref 3.4–4.8)
PROTEIN TOTAL: 5.4 g/dL — ABNORMAL LOW (ref 5.7–8.2)
SODIUM: 140 mmol/L (ref 135–145)

## 2021-04-17 LAB — CBC W/ AUTO DIFF
BASOPHILS ABSOLUTE COUNT: 0 10*9/L (ref 0.0–0.1)
BASOPHILS RELATIVE PERCENT: 0.2 %
EOSINOPHILS ABSOLUTE COUNT: 0 10*9/L (ref 0.0–0.5)
EOSINOPHILS RELATIVE PERCENT: 0.4 %
HEMATOCRIT: 24 % — ABNORMAL LOW (ref 34.0–44.0)
HEMOGLOBIN: 7.8 g/dL — ABNORMAL LOW (ref 11.3–14.9)
LYMPHOCYTES ABSOLUTE COUNT: 6.8 10*9/L — ABNORMAL HIGH (ref 1.1–3.6)
LYMPHOCYTES RELATIVE PERCENT: 65.7 %
MEAN CORPUSCULAR HEMOGLOBIN CONC: 32.5 g/dL (ref 32.0–36.0)
MEAN CORPUSCULAR HEMOGLOBIN: 29.5 pg (ref 25.9–32.4)
MEAN CORPUSCULAR VOLUME: 90.6 fL (ref 77.6–95.7)
MEAN PLATELET VOLUME: 10.9 fL — ABNORMAL HIGH (ref 6.8–10.7)
MONOCYTES ABSOLUTE COUNT: 0.4 10*9/L (ref 0.3–0.8)
MONOCYTES RELATIVE PERCENT: 4 %
NEUTROPHILS ABSOLUTE COUNT: 3.1 10*9/L (ref 1.8–7.8)
NEUTROPHILS RELATIVE PERCENT: 29.7 %
PLATELET COUNT: 33 10*9/L — ABNORMAL LOW (ref 150–450)
RED BLOOD CELL COUNT: 2.65 10*12/L — ABNORMAL LOW (ref 3.95–5.13)
RED CELL DISTRIBUTION WIDTH: 22 % — ABNORMAL HIGH (ref 12.2–15.2)
WBC ADJUSTED: 10.3 10*9/L (ref 3.6–11.2)

## 2021-04-17 MED ADMIN — sodium chloride (NS) 0.9 % infusion: INTRAVENOUS | @ 20:00:00

## 2021-04-17 NOTE — Unmapped (Signed)
Hi,     Adalin Vanderploeg contacted the [employer] requesting to speak with the care team of Cristina Fields to discuss:    States that patient is in infusion and only being given one bag of blood as opposed to prescribed two. Asking if infusions can be arranged locally     Please contact Mr. Scheper at [phone].    Thank you,   Kelli Hope  [facility]   [phone]

## 2021-04-17 NOTE — Unmapped (Signed)
PIV placed.  Labs drawn & sent for analysis. To next appt.  Care provided by Baker Pierini RN.

## 2021-04-17 NOTE — Unmapped (Signed)
Called patient's husband to let him know Marisue Ivan would be back tomorrow and would work with him on this request.

## 2021-04-18 NOTE — Unmapped (Signed)
Lab on 04/17/2021   Component Date Value Ref Range Status    ABO Grouping 04/17/2021 B POS   Final    Antibody Screen 04/17/2021 NEG   Final    Sodium 04/17/2021 140  135 - 145 mmol/L Final    Potassium 04/17/2021 3.8  3.4 - 4.8 mmol/L Final    Chloride 04/17/2021 104  98 - 107 mmol/L Final    CO2 04/17/2021 25.0  20.0 - 31.0 mmol/L Final    Anion Gap 04/17/2021 11  5 - 14 mmol/L Final    BUN 04/17/2021 50 (A) 9 - 23 mg/dL Final    Creatinine 16/07/9603 5.49 (A) 0.60 - 0.80 mg/dL Final    BUN/Creatinine Ratio 04/17/2021 9   Final    eGFR CKD-EPI (2021) Female 04/17/2021 8 (A) >=60 mL/min/1.54m2 Final    eGFR calculated with CKD-EPI 2021 equation in accordance with [employer] and [employer] of Nephrology Task Force recommendations.    Glucose 04/17/2021 317 (A) 70 - 179 mg/dL Final    Calcium 54/06/8118 8.5 (A) 8.7 - 10.4 mg/dL Final    Albumin 14/78/2956 3.1 (A) 3.4 - 5.0 g/dL Final    Total Protein 04/17/2021 5.4 (A) 5.7 - 8.2 g/dL Final    Total Bilirubin 04/17/2021 0.3  0.3 - 1.2 mg/dL Final    AST 21/30/8657 23  <=34 U/L Final    ALT 04/17/2021 20  10 - 49 U/L Final    Alkaline Phosphatase 04/17/2021 88  46 - 116 U/L Final    Results Verified by Slide Scan 04/17/2021 Slide Reviewed   Final    WBC 04/17/2021 10.3  3.6 - 11.2 10*9/L Final    RBC 04/17/2021 2.65 (A) 3.95 - 5.13 10*12/L Final    HGB 04/17/2021 7.8 (A) 11.3 - 14.9 g/dL Final    HCT 84/69/6295 24.0 (A) 34.0 - 44.0 % Final    MCV 04/17/2021 90.6  77.6 - 95.7 fL Final    MCH 04/17/2021 29.5  25.9 - 32.4 pg Final    MCHC 04/17/2021 32.5  32.0 - 36.0 g/dL Final    RDW 28/41/3244 22.0 (A) 12.2 - 15.2 % Final    MPV 04/17/2021 10.9 (A) 6.8 - 10.7 fL Final    Platelet 04/17/2021 33 (A) 150 - 450 10*9/L Final    Neutrophils % 04/17/2021 29.7  % Final    Lymphocytes % 04/17/2021 65.7  % Final    Monocytes % 04/17/2021 4.0  % Final    Eosinophils % 04/17/2021 0.4  % Final    Basophils % 04/17/2021 0.2  % Final    Absolute Neutrophils 04/17/2021 3.1  1.8 - 7.8 10*9/L Final    Absolute Lymphocytes 04/17/2021 6.8 (A) 1.1 - 3.6 10*9/L Final    Absolute Monocytes 04/17/2021 0.4  0.3 - 0.8 10*9/L Final    Absolute Eosinophils 04/17/2021 0.0  0.0 - 0.5 10*9/L Final    Absolute Basophils 04/17/2021 0.0  0.0 - 0.1 10*9/L Final    Anisocytosis 04/17/2021 Moderate (A) Not Present Final   Hospital Outpatient Visit on 04/17/2021   Component Date Value Ref Range Status    Crossmatch 04/17/2021 Compatible   Final    Unit Blood Type 04/17/2021 B Neg   Final    ISBT Number 04/17/2021 1700   Final    Unit # 04/17/2021 [id number]   Final    Status 04/17/2021 Issued   Final    Spec Expiration 04/17/2021 [id number]   Final    Product ID 04/17/2021  Red Blood Cells   Final    PRODUCT CODE 04/17/2021 [id number]   Final

## 2021-04-18 NOTE — Unmapped (Signed)
No longer taking Brukinsa.

## 2021-04-18 NOTE — Unmapped (Signed)
Blood and Blood Products Transfusion Consent      I have discussed with the patient the risks and benefits associated with blood and blood product transfusions. Patient had the opportunity to ask questions. Patient has tolerated transfusion of blood products uneventfully during previous encounters. Completed consent form to be scanned into the EMR.             Montel Culver, ANP  ADVANCED PRACTICE PROVIDER FOR INFUSION PROGRAM  PAGER:[phone]  OFFICE [phone]

## 2021-04-18 NOTE — Unmapped (Signed)
I returned Cristina Fields call to clarify blood transfusions and discuss whether her dialysis center could do the transfusions.  Our concern was both the travel to [facility] and fluid volume.      When I spoke to Cristina Fields he said that his wife is currently getting dialysis and was notified that they are sending her to the ED, preferably [facility].  She was not doing well and they need her evaluated.    I told Cristina Fields I would watch for her admission and notify our providers.

## 2021-04-18 NOTE — Unmapped (Signed)
Encounter addended by: Ronney Lion, ANP on: 04/17/2021 9:08 PM   Actions taken: Clinical Note Signed

## 2021-04-18 NOTE — Unmapped (Signed)
Pt tolerated blood transfusion without difficulty today.  AVS given.  Pt was stable at discharge via wheelchair by husband.

## 2021-04-19 NOTE — Unmapped (Signed)
I called Mr Pinkley to follow up on our conversation yesterday where he stated that the Dialylsis center wanted to send his wife to the ED    Mr. Weyand explained that his wife was released back to the [facility]. He has been unable to speak with her today and she states that she is very tired.    Tempestt continues to have transfusion appointments at [facility] with the next one shedueld on 6/27.    With her ESRD I asked Mr. Sorenson if the dialysis center can transfuse during treatment but he stated that the nurse was not familiar with this request.     We briefly discussed Mr. Spillman's statements about both of them being very tired and I encouraged his to rest today when he can.

## 2021-04-21 ENCOUNTER — Ambulatory Visit: Admit: 2021-04-21 | Discharge: 2021-04-22 | Payer: MEDICARE

## 2021-04-21 ENCOUNTER — Other Ambulatory Visit: Admit: 2021-04-21 | Discharge: 2021-04-22 | Payer: MEDICARE

## 2021-04-21 DIAGNOSIS — C9112 Chronic lymphocytic leukemia of B-cell type in relapse: Secondary | ICD-10-CM | POA: Diagnosis not present

## 2021-04-21 LAB — SLIDE REVIEW

## 2021-04-21 LAB — CBC W/ AUTO DIFF
BASOPHILS ABSOLUTE COUNT: 0.1 10*9/L (ref 0.0–0.1)
BASOPHILS RELATIVE PERCENT: 0.5 %
EOSINOPHILS ABSOLUTE COUNT: 0 10*9/L (ref 0.0–0.5)
EOSINOPHILS RELATIVE PERCENT: 0.3 %
HEMATOCRIT: 31.9 % — ABNORMAL LOW (ref 34.0–44.0)
HEMOGLOBIN: 10.5 g/dL — ABNORMAL LOW (ref 11.3–14.9)
LYMPHOCYTES ABSOLUTE COUNT: 6.5 10*9/L — ABNORMAL HIGH (ref 1.1–3.6)
LYMPHOCYTES RELATIVE PERCENT: 53.3 %
MEAN CORPUSCULAR HEMOGLOBIN CONC: 33 g/dL (ref 32.0–36.0)
MEAN CORPUSCULAR HEMOGLOBIN: 28.8 pg (ref 25.9–32.4)
MEAN CORPUSCULAR VOLUME: 87.3 fL (ref 77.6–95.7)
MEAN PLATELET VOLUME: 9.1 fL (ref 6.8–10.7)
MONOCYTES ABSOLUTE COUNT: 0.6 10*9/L (ref 0.3–0.8)
MONOCYTES RELATIVE PERCENT: 4.8 %
NEUTROPHILS ABSOLUTE COUNT: 5 10*9/L (ref 1.8–7.8)
NEUTROPHILS RELATIVE PERCENT: 41.1 %
PLATELET COUNT: 25 10*9/L — ABNORMAL LOW (ref 150–450)
RED BLOOD CELL COUNT: 3.65 10*12/L — ABNORMAL LOW (ref 3.95–5.13)
RED CELL DISTRIBUTION WIDTH: 21 % — ABNORMAL HIGH (ref 12.2–15.2)
WBC ADJUSTED: 12.1 10*9/L — ABNORMAL HIGH (ref 3.6–11.2)

## 2021-04-22 DIAGNOSIS — C9112 Chronic lymphocytic leukemia of B-cell type in relapse: Principal | ICD-10-CM

## 2021-04-22 NOTE — Unmapped (Incomplete)
Brief Adult Oncology Infusion Clinic Note:                        Leeroy Cha, FNP

## 2021-04-24 ENCOUNTER — Ambulatory Visit: Admit: 2021-04-24 | Discharge: 2021-04-25 | Payer: MEDICARE

## 2021-04-24 DIAGNOSIS — C9112 Chronic lymphocytic leukemia of B-cell type in relapse: Secondary | ICD-10-CM | POA: Diagnosis not present

## 2021-04-24 LAB — SLIDE REVIEW

## 2021-04-24 LAB — CBC W/ AUTO DIFF
BASOPHILS ABSOLUTE COUNT: 0 10*9/L (ref 0.0–0.1)
BASOPHILS RELATIVE PERCENT: 0.6 %
EOSINOPHILS ABSOLUTE COUNT: 0 10*9/L (ref 0.0–0.5)
EOSINOPHILS RELATIVE PERCENT: 0.4 %
HEMATOCRIT: 25.2 % — ABNORMAL LOW (ref 34.0–44.0)
HEMOGLOBIN: 8.6 g/dL — ABNORMAL LOW (ref 11.3–14.9)
LYMPHOCYTES ABSOLUTE COUNT: 3.6 10*9/L (ref 1.1–3.6)
LYMPHOCYTES RELATIVE PERCENT: 51.1 %
MEAN CORPUSCULAR HEMOGLOBIN CONC: 34.1 g/dL (ref 32.0–36.0)
MEAN CORPUSCULAR HEMOGLOBIN: 29.3 pg (ref 25.9–32.4)
MEAN CORPUSCULAR VOLUME: 85.9 fL (ref 77.6–95.7)
MEAN PLATELET VOLUME: 9.7 fL (ref 6.8–10.7)
MONOCYTES ABSOLUTE COUNT: 0.3 10*9/L (ref 0.3–0.8)
MONOCYTES RELATIVE PERCENT: 4.4 %
NEUTROPHILS ABSOLUTE COUNT: 3.1 10*9/L (ref 1.8–7.8)
NEUTROPHILS RELATIVE PERCENT: 43.5 %
PLATELET COUNT: 21 10*9/L — ABNORMAL LOW (ref 150–450)
RED BLOOD CELL COUNT: 2.93 10*12/L — ABNORMAL LOW (ref 3.95–5.13)
RED CELL DISTRIBUTION WIDTH: 19.7 % — ABNORMAL HIGH (ref 12.2–15.2)
WBC ADJUSTED: 7.1 10*9/L (ref 3.6–11.2)

## 2021-04-24 NOTE — Unmapped (Signed)
Patient does not meet parameters for transfusion.  PIV removed, AVS given and discharged to home.

## 2021-04-24 NOTE — Unmapped (Signed)
If you feel like this is an emergency please call 911.  For appointments or questions Monday through Friday 8AM-5PM please call [phone] or Toll Free [phone]. For Medical questions or concerns ask for the Nurse Triage Line.  On Nights, Weekends, and Holidays call [phone] and ask for the Oncologist on Call.  Reasons to call the Nurse Triage Line:  Fever of 100.5 or greater  Nausea and/or vomiting not relived with nausea medicine  Diarrhea or constipation  Severe pain not relieved with usual pain regimen  Shortness of breath  Uncontrolled bleeding  Mental status changes  Hospital Outpatient Visit on 04/24/2021   Component Date Value Ref Range Status    WBC 04/24/2021 7.1  3.6 - 11.2 10*9/L Final    RBC 04/24/2021 2.93 (A) 3.95 - 5.13 10*12/L Final    HGB 04/24/2021 8.6 (A) 11.3 - 14.9 g/dL Final    HCT 57/84/6962 25.2 (A) 34.0 - 44.0 % Final    MCV 04/24/2021 85.9  77.6 - 95.7 fL Final    MCH 04/24/2021 29.3  25.9 - 32.4 pg Final    MCHC 04/24/2021 34.1  32.0 - 36.0 g/dL Final    RDW 95/28/4132 19.7 (A) 12.2 - 15.2 % Final    MPV 04/24/2021 9.7  6.8 - 10.7 fL Final    Platelet 04/24/2021 21 (A) 150 - 450 10*9/L Final    Neutrophils % 04/24/2021 43.5  % Final    Lymphocytes % 04/24/2021 51.1  % Final    Monocytes % 04/24/2021 4.4  % Final    Eosinophils % 04/24/2021 0.4  % Final    Basophils % 04/24/2021 0.6  % Final    Absolute Neutrophils 04/24/2021 3.1  1.8 - 7.8 10*9/L Final    Absolute Lymphocytes 04/24/2021 3.6  1.1 - 3.6 10*9/L Final    Absolute Monocytes 04/24/2021 0.3  0.3 - 0.8 10*9/L Final    Absolute Eosinophils 04/24/2021 0.0  0.0 - 0.5 10*9/L Final    Absolute Basophils 04/24/2021 0.0  0.0 - 0.1 10*9/L Final    Anisocytosis 04/24/2021 Moderate (A) Not Present Final    Smear Review Comments 04/24/2021 See Comment (A) Undefined Final    Slide Reviewed. [id number]    Toxic Vacuolation 04/24/2021 Present (A) Not Present Final

## 2021-04-28 MED ORDER — ONDANSETRON HCL 4 MG TABLET
0 days
Start: 2021-04-28 — End: ?

## 2021-05-01 NOTE — Unmapped (Signed)
Hi,     [name] contacted the [employer] regarding the following:    - Pt is needing to cancel today's appt ; should have another scheduled for 07/15    Please contact [name] at [phone].    Thanks in advance,    Keturah Shavers  [facility] Communication [facility]   [phone]

## 2021-05-01 NOTE — Unmapped (Signed)
Called patient about missed apt but line rang busy so no VM left

## 2021-05-09 ENCOUNTER — Emergency Department (HOSPITAL_COMMUNITY): Payer: Medicare Other

## 2021-05-09 ENCOUNTER — Encounter (HOSPITAL_COMMUNITY): Payer: Self-pay

## 2021-05-09 ENCOUNTER — Inpatient Hospital Stay (HOSPITAL_COMMUNITY)
Admission: EM | Admit: 2021-05-09 | Discharge: 2021-05-19 | DRG: 064 | Disposition: A | Discharge status: Home Health | Payer: Medicare Other | Attending: Internal Medicine | Admitting: Internal Medicine

## 2021-05-09 ENCOUNTER — Other Ambulatory Visit: Payer: Self-pay

## 2021-05-09 DIAGNOSIS — R29704 NIHSS score 4: Secondary | ICD-10-CM | POA: Diagnosis present

## 2021-05-09 DIAGNOSIS — E876 Hypokalemia: Secondary | ICD-10-CM | POA: Diagnosis present

## 2021-05-09 DIAGNOSIS — Z79899 Other long term (current) drug therapy: Secondary | ICD-10-CM

## 2021-05-09 DIAGNOSIS — A630 Anogenital (venereal) warts: Secondary | ICD-10-CM | POA: Diagnosis present

## 2021-05-09 DIAGNOSIS — I132 Hypertensive heart and chronic kidney disease with heart failure and with stage 5 chronic kidney disease, or end stage renal disease: Secondary | ICD-10-CM | POA: Diagnosis present

## 2021-05-09 DIAGNOSIS — Z992 Dependence on renal dialysis: Secondary | ICD-10-CM | POA: Diagnosis not present

## 2021-05-09 DIAGNOSIS — N186 End stage renal disease: Secondary | ICD-10-CM | POA: Diagnosis not present

## 2021-05-09 DIAGNOSIS — T380X5A Adverse effect of glucocorticoids and synthetic analogues, initial encounter: Secondary | ICD-10-CM | POA: Diagnosis present

## 2021-05-09 DIAGNOSIS — J80 Acute respiratory distress syndrome: Secondary | ICD-10-CM | POA: Diagnosis not present

## 2021-05-09 DIAGNOSIS — I639 Cerebral infarction, unspecified: Secondary | ICD-10-CM | POA: Diagnosis not present

## 2021-05-09 DIAGNOSIS — C9110 Chronic lymphocytic leukemia of B-cell type not having achieved remission: Secondary | ICD-10-CM | POA: Diagnosis present

## 2021-05-09 DIAGNOSIS — Z90710 Acquired absence of both cervix and uterus: Secondary | ICD-10-CM | POA: Diagnosis not present

## 2021-05-09 DIAGNOSIS — I6389 Other cerebral infarction: Principal | ICD-10-CM | POA: Diagnosis present

## 2021-05-09 DIAGNOSIS — I10 Essential (primary) hypertension: Secondary | ICD-10-CM | POA: Diagnosis present

## 2021-05-09 DIAGNOSIS — H9191 Unspecified hearing loss, right ear: Secondary | ICD-10-CM | POA: Diagnosis present

## 2021-05-09 DIAGNOSIS — R0600 Dyspnea, unspecified: Secondary | ICD-10-CM | POA: Diagnosis not present

## 2021-05-09 DIAGNOSIS — N2581 Secondary hyperparathyroidism of renal origin: Secondary | ICD-10-CM | POA: Diagnosis present

## 2021-05-09 DIAGNOSIS — U071 COVID-19: Secondary | ICD-10-CM | POA: Diagnosis present

## 2021-05-09 DIAGNOSIS — R0902 Hypoxemia: Secondary | ICD-10-CM | POA: Diagnosis not present

## 2021-05-09 DIAGNOSIS — D631 Anemia in chronic kidney disease: Secondary | ICD-10-CM | POA: Diagnosis not present

## 2021-05-09 DIAGNOSIS — R531 Weakness: Secondary | ICD-10-CM | POA: Diagnosis not present

## 2021-05-09 DIAGNOSIS — J9621 Acute and chronic respiratory failure with hypoxia: Secondary | ICD-10-CM | POA: Diagnosis not present

## 2021-05-09 DIAGNOSIS — R001 Bradycardia, unspecified: Secondary | ICD-10-CM | POA: Diagnosis not present

## 2021-05-09 DIAGNOSIS — D696 Thrombocytopenia, unspecified: Secondary | ICD-10-CM | POA: Insufficient documentation

## 2021-05-09 DIAGNOSIS — I636 Cerebral infarction due to cerebral venous thrombosis, nonpyogenic: Secondary | ICD-10-CM | POA: Diagnosis not present

## 2021-05-09 DIAGNOSIS — E785 Hyperlipidemia, unspecified: Secondary | ICD-10-CM | POA: Diagnosis present

## 2021-05-09 DIAGNOSIS — I517 Cardiomegaly: Secondary | ICD-10-CM | POA: Diagnosis not present

## 2021-05-09 DIAGNOSIS — R262 Difficulty in walking, not elsewhere classified: Secondary | ICD-10-CM | POA: Diagnosis not present

## 2021-05-09 DIAGNOSIS — Z833 Family history of diabetes mellitus: Secondary | ICD-10-CM

## 2021-05-09 DIAGNOSIS — R42 Dizziness and giddiness: Secondary | ICD-10-CM | POA: Diagnosis not present

## 2021-05-09 DIAGNOSIS — G8191 Hemiplegia, unspecified affecting right dominant side: Secondary | ICD-10-CM | POA: Diagnosis present

## 2021-05-09 DIAGNOSIS — Z9981 Dependence on supplemental oxygen: Secondary | ICD-10-CM

## 2021-05-09 DIAGNOSIS — E1122 Type 2 diabetes mellitus with diabetic chronic kidney disease: Secondary | ICD-10-CM | POA: Diagnosis not present

## 2021-05-09 DIAGNOSIS — E119 Type 2 diabetes mellitus without complications: Secondary | ICD-10-CM

## 2021-05-09 DIAGNOSIS — N250 Renal osteodystrophy: Secondary | ICD-10-CM | POA: Diagnosis not present

## 2021-05-09 DIAGNOSIS — E1165 Type 2 diabetes mellitus with hyperglycemia: Secondary | ICD-10-CM | POA: Diagnosis present

## 2021-05-09 DIAGNOSIS — R0602 Shortness of breath: Secondary | ICD-10-CM | POA: Diagnosis not present

## 2021-05-09 DIAGNOSIS — I5041 Acute combined systolic (congestive) and diastolic (congestive) heart failure: Secondary | ICD-10-CM | POA: Diagnosis present

## 2021-05-09 DIAGNOSIS — J9811 Atelectasis: Secondary | ICD-10-CM | POA: Diagnosis not present

## 2021-05-09 DIAGNOSIS — D6959 Other secondary thrombocytopenia: Secondary | ICD-10-CM | POA: Diagnosis not present

## 2021-05-09 DIAGNOSIS — J811 Chronic pulmonary edema: Secondary | ICD-10-CM | POA: Diagnosis not present

## 2021-05-09 DIAGNOSIS — I959 Hypotension, unspecified: Secondary | ICD-10-CM | POA: Diagnosis not present

## 2021-05-09 DIAGNOSIS — I120 Hypertensive chronic kidney disease with stage 5 chronic kidney disease or end stage renal disease: Secondary | ICD-10-CM | POA: Diagnosis not present

## 2021-05-09 DIAGNOSIS — G319 Degenerative disease of nervous system, unspecified: Secondary | ICD-10-CM | POA: Diagnosis not present

## 2021-05-09 DIAGNOSIS — J90 Pleural effusion, not elsewhere classified: Secondary | ICD-10-CM | POA: Diagnosis not present

## 2021-05-09 DIAGNOSIS — R4182 Altered mental status, unspecified: Secondary | ICD-10-CM | POA: Diagnosis not present

## 2021-05-09 LAB — CBC WITH DIFFERENTIAL/PLATELET
Abs Immature Granulocytes: 0.11 10*3/uL — ABNORMAL HIGH (ref 0.00–0.07)
Basophils Absolute: 0 10*3/uL (ref 0.0–0.1)
Basophils Relative: 0 %
Eosinophils Absolute: 0.4 10*3/uL (ref 0.0–0.5)
Eosinophils Relative: 5 %
HCT: 27.1 % — ABNORMAL LOW (ref 36.0–46.0)
Hemoglobin: 8.4 g/dL — ABNORMAL LOW (ref 12.0–15.0)
Immature Granulocytes: 1 %
Lymphocytes Relative: 37 %
Lymphs Abs: 2.9 10*3/uL (ref 0.7–4.0)
MCH: 27.1 pg (ref 26.0–34.0)
MCHC: 31 g/dL (ref 30.0–36.0)
MCV: 87.4 fL (ref 80.0–100.0)
Monocytes Absolute: 0.5 10*3/uL (ref 0.1–1.0)
Monocytes Relative: 7 %
Neutro Abs: 3.9 10*3/uL (ref 1.7–7.7)
Neutrophils Relative %: 50 %
Platelets: 26 10*3/uL — CL (ref 150–400)
RBC: 3.1 MIL/uL — ABNORMAL LOW (ref 3.87–5.11)
RDW: 17.7 % — ABNORMAL HIGH (ref 11.5–15.5)
WBC: 7.8 10*3/uL (ref 4.0–10.5)
nRBC: 0 % (ref 0.0–0.2)

## 2021-05-09 LAB — HIV ANTIBODY (ROUTINE TESTING W REFLEX): HIV Screen 4th Generation wRfx: NONREACTIVE

## 2021-05-09 LAB — BASIC METABOLIC PANEL
Anion gap: 10 (ref 5–15)
BUN: 45 mg/dL — ABNORMAL HIGH (ref 8–23)
CO2: 27 mmol/L (ref 22–32)
Calcium: 8.6 mg/dL — ABNORMAL LOW (ref 8.9–10.3)
Chloride: 105 mmol/L (ref 98–111)
Creatinine, Ser: 6.21 mg/dL — ABNORMAL HIGH (ref 0.44–1.00)
GFR, Estimated: 7 mL/min — ABNORMAL LOW (ref >60)
Glucose, Bld: 160 mg/dL — ABNORMAL HIGH (ref 70–99)
Potassium: 3.9 mmol/L (ref 3.5–5.1)
Sodium: 142 mmol/L (ref 135–145)

## 2021-05-09 LAB — TROPONIN I (HIGH SENSITIVITY)
Troponin I (High Sensitivity): 17 ng/L (ref <18)
Troponin I (High Sensitivity): 17 ng/L (ref <18)

## 2021-05-09 LAB — ABO/RH: ABO/RH(D): B POS

## 2021-05-09 LAB — TYPE AND SCREEN
ABO/RH(D): B POS
Antibody Screen: NEGATIVE

## 2021-05-09 LAB — RESP PANEL BY RT-PCR (FLU A&B, COVID) ARPGX2
Influenza A by PCR: NEGATIVE
Influenza B by PCR: NEGATIVE
SARS Coronavirus 2 by RT PCR: POSITIVE — AB

## 2021-05-09 LAB — CBC
HCT: 25.9 % — ABNORMAL LOW (ref 36.0–46.0)
HCT: 27 % — ABNORMAL LOW (ref 36.0–46.0)
Hemoglobin: 8.1 g/dL — ABNORMAL LOW (ref 12.0–15.0)
Hemoglobin: 8.6 g/dL — ABNORMAL LOW (ref 12.0–15.0)
MCH: 27.5 pg (ref 26.0–34.0)
MCH: 27.8 pg (ref 26.0–34.0)
MCHC: 31.3 g/dL (ref 30.0–36.0)
MCHC: 31.9 g/dL (ref 30.0–36.0)
MCV: 87.8 fL (ref 80.0–100.0)
MCV: 87.4 fL (ref 80.0–100.0)
Platelets: 24 10*3/uL — CL (ref 150–400)
Platelets: 25 10*3/uL — CL (ref 150–400)
RBC: 2.95 MIL/uL — ABNORMAL LOW (ref 3.87–5.11)
RBC: 3.09 MIL/uL — ABNORMAL LOW (ref 3.87–5.11)
RDW: 17.6 % — ABNORMAL HIGH (ref 11.5–15.5)
RDW: 17.8 % — ABNORMAL HIGH (ref 11.5–15.5)
WBC: 7.6 10*3/uL (ref 4.0–10.5)
WBC: 8.5 10*3/uL (ref 4.0–10.5)
nRBC: 0 % (ref 0.0–0.2)
nRBC: 0 % (ref 0.0–0.2)

## 2021-05-09 LAB — IMMATURE PLATELET FRACTION: Immature Platelet Fraction: 26.5 % — ABNORMAL HIGH (ref 1.2–8.6)

## 2021-05-09 IMAGING — DX DG CHEST 1V PORT
1 series · 1 of 1 positions shown · non-contrast
Comparison: Portable exam [67] hours compared to [DATE]

CLINICAL DATA: Shortness of breath, generalized weakness, unable to
talk in complete sentences, end-stage renal disease on dialysis,
missed dialysis today due to weakness

EXAM:
PORTABLE CHEST 1 VIEW

[chest ap]
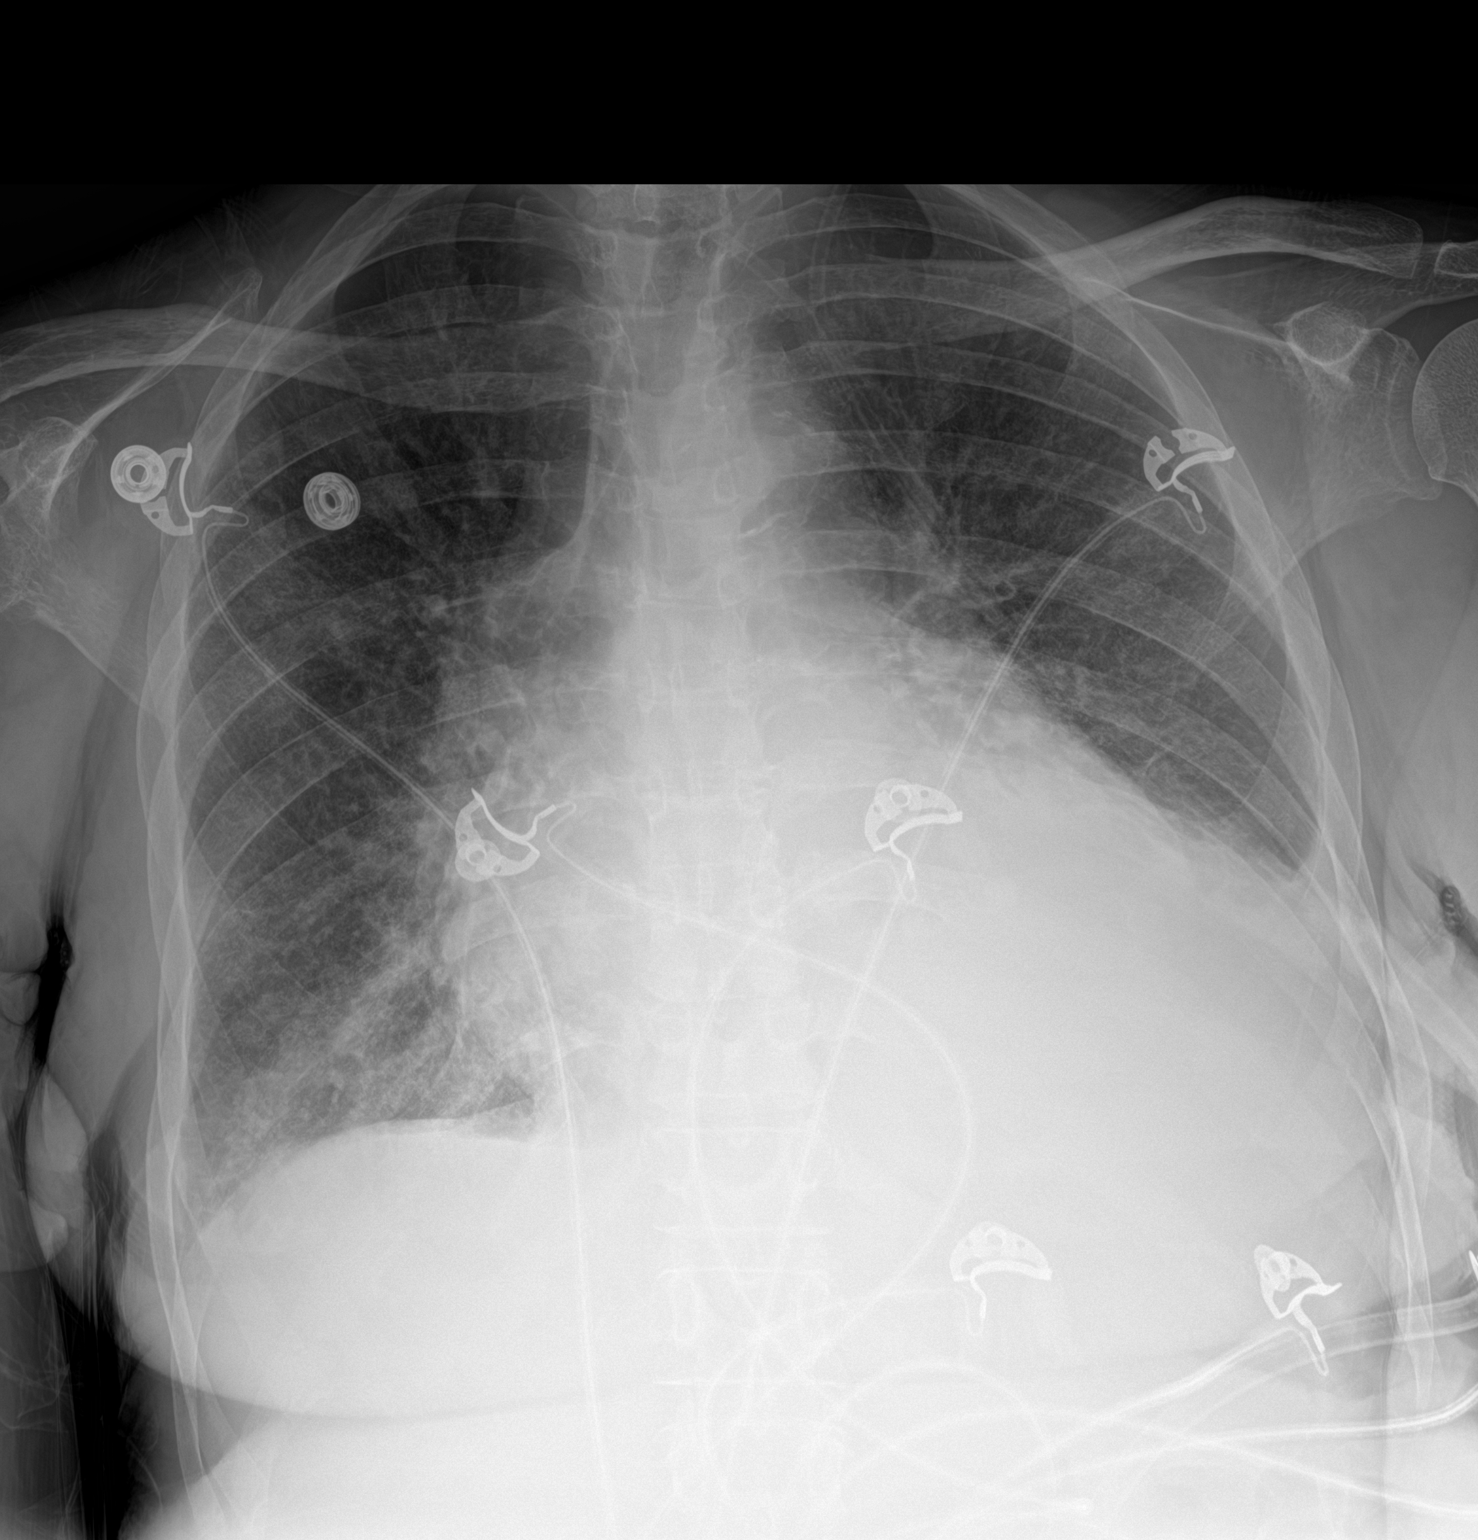

[1 of 1 positions shown; findings below may reference images not displayed]

FINDINGS: Enlargement of cardiac silhouette with pulmonary vascular
congestion.

Atherosclerotic calcification aorta.

Mild interstitial infiltrates consistent with pulmonary edema and
CHF versus fluid overload.

LEFT pleural effusion and basilar atelectasis present.

No pneumothorax or acute osseous findings.
IMPRESSION: Enlargement of cardiac silhouette with pulmonary vascular congestion
and mild diffuse pulmonary edema.

LEFT pleural effusion and LEFT basilar atelectasis.

Aortic Atherosclerosis ([67]-[67]).

## 2021-05-09 IMAGING — MR MR HEAD W/O CM
6 of 11 series · 24 of 48 positions shown · non-contrast
Comparison: Same day CT head.

EXAM:
MRI HEAD WITHOUT CONTRAST
TECHNIQUE: Multiplanar, multiecho pulse sequences of the brain and surrounding
structures were obtained without intravenous contrast.

[Series 2: DWI · axial · 3.0mm · 0.94mm/px · z∈[-80,+62]mm · 7 of 98 slices shown (1 of 2)]
[im 1/98]
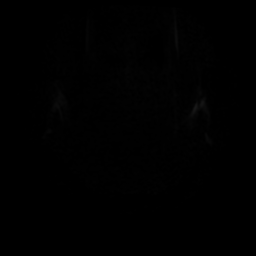
[im 17/98]
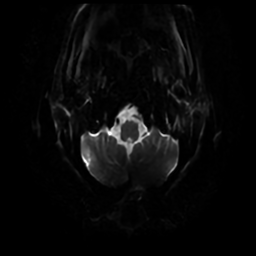
[im 33/98]
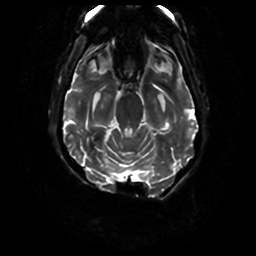
[im 49/98]
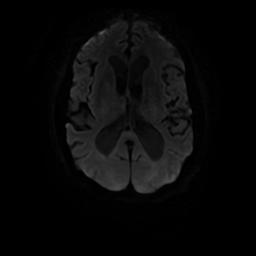
[im 65/98]
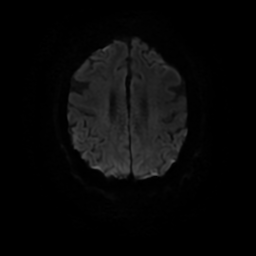
[im 81/98]
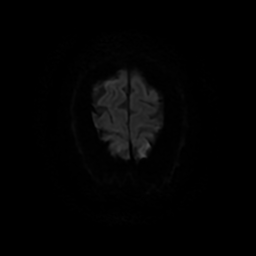
[im 98/98]
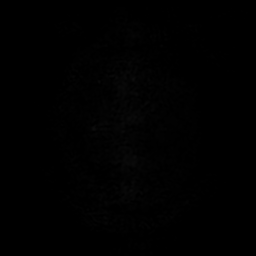

[Series 3: DWI · coronal · 4.0mm · 0.94mm/px · 5 of 72 slices shown (2 of 2)]
[im 1/72]
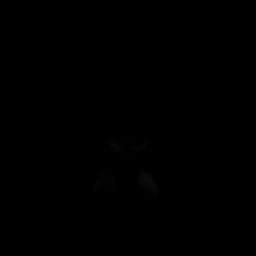
[im 18/72]
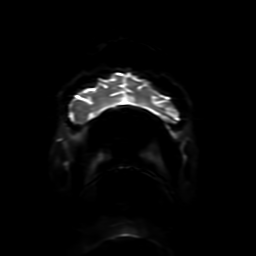
[im 36/72]
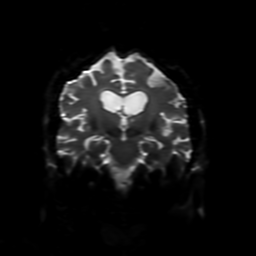
[im 54/72]
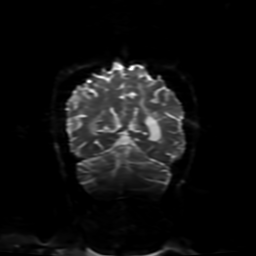
[im 72/72]
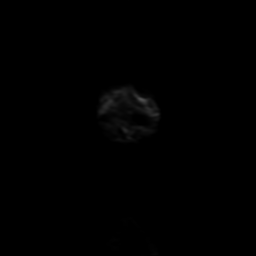

[Series 4: FLAIR · sagittal · 5.0mm · 0.23mm/px · 2 of 27 slices shown (1 of 2)]
[im 1/27]
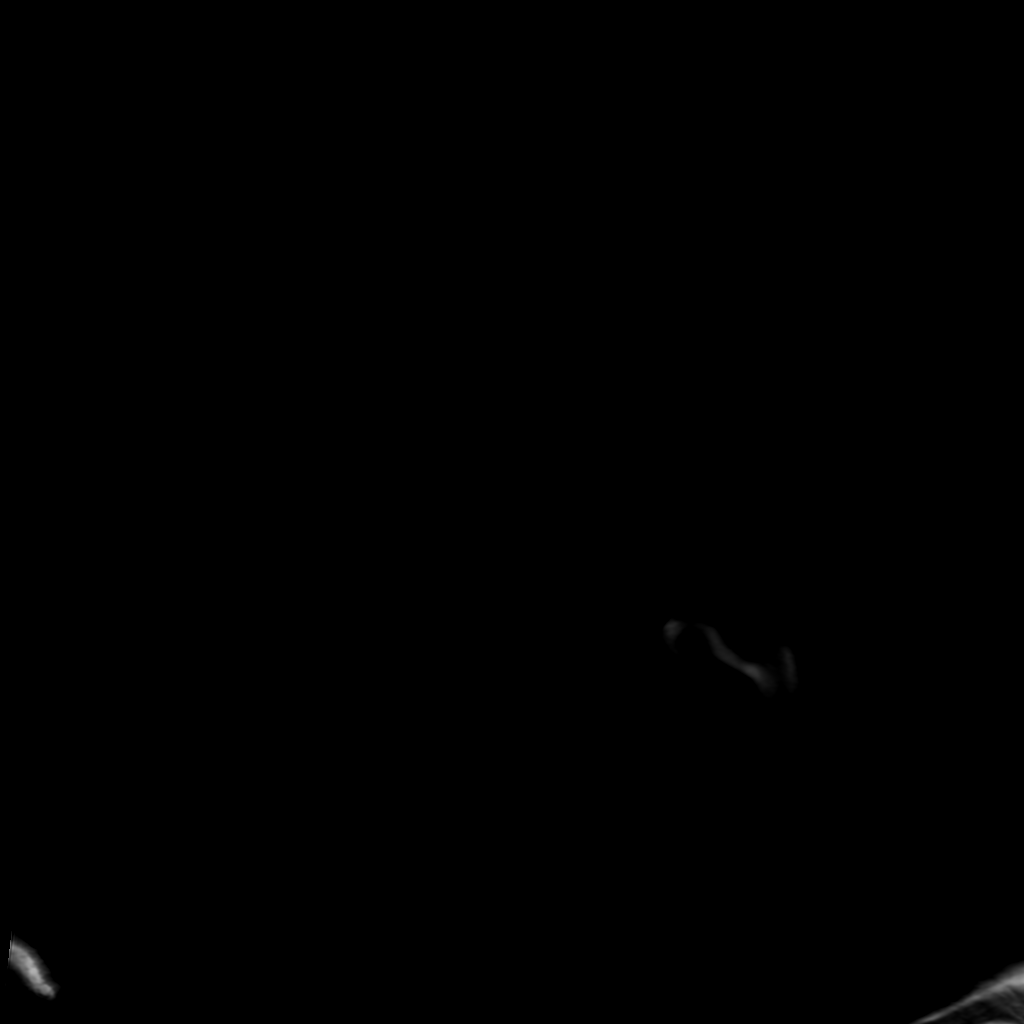
[im 27/27]
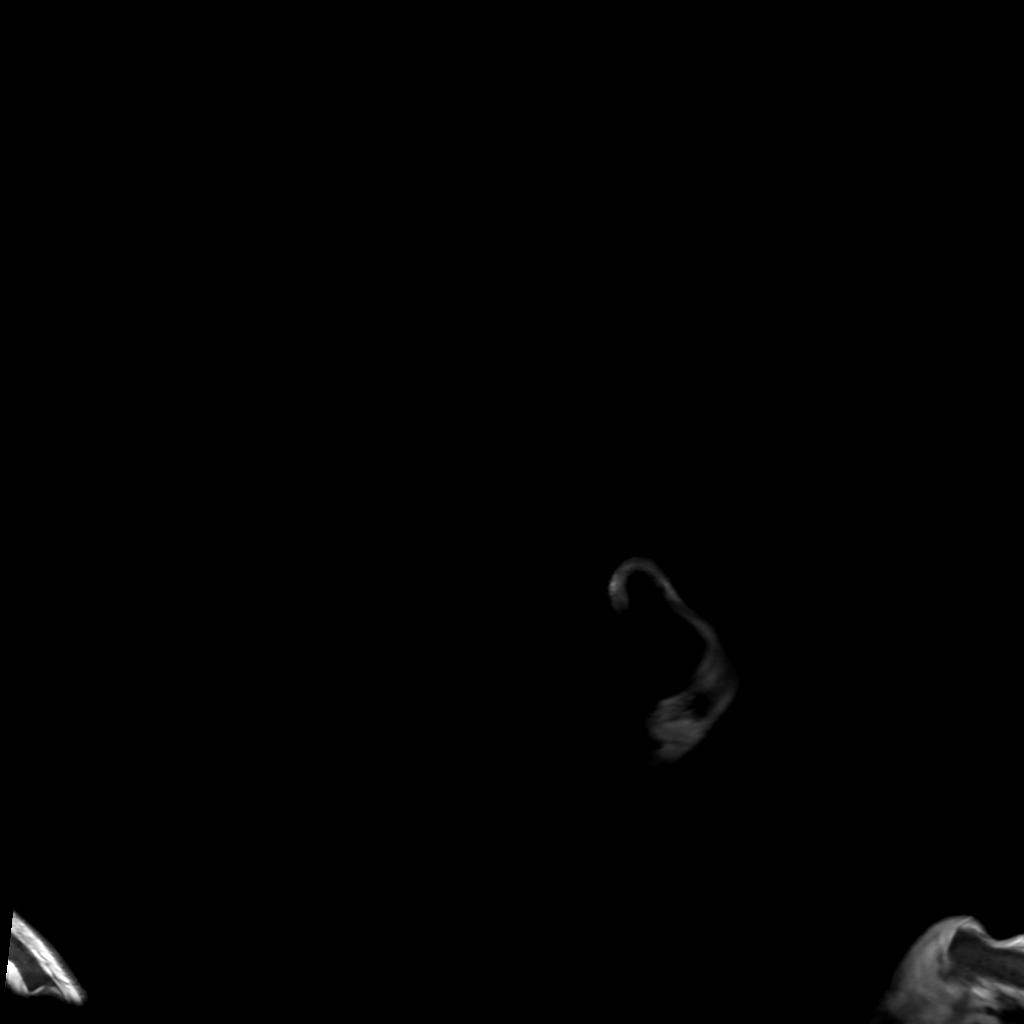

[Series 6: FLAIR · axial · 4.0mm · 0.45mm/px · z∈[-78,+62]mm · 3 of 34 slices shown (2 of 2)]
[im 1/34]
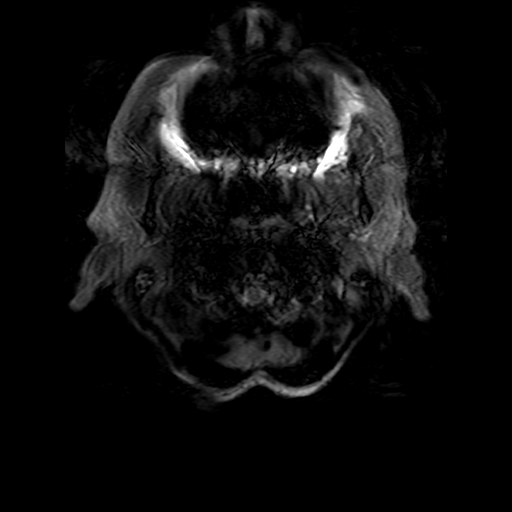
[im 17/34]
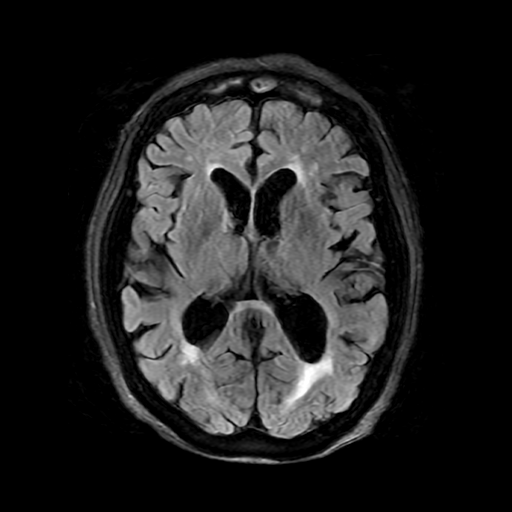
[im 34/34]
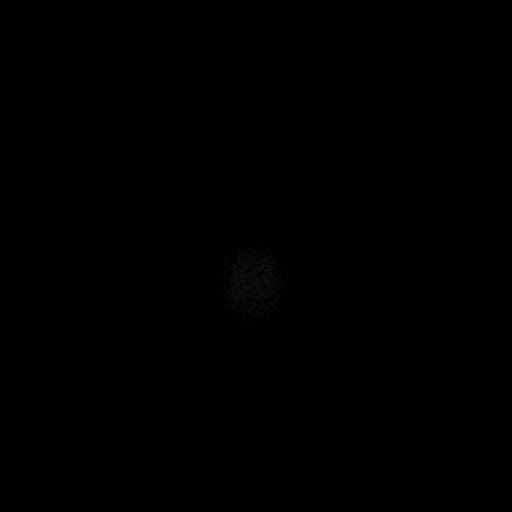

[Series 250: ADC · axial · 3.0mm · 0.94mm/px · z∈[-80,+62]mm · 4 of 50 slices shown (1 of 2)]
[im 1/50]
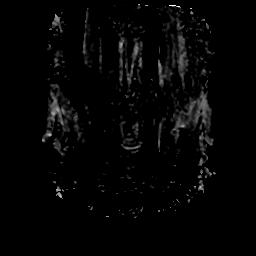
[im 17/50]
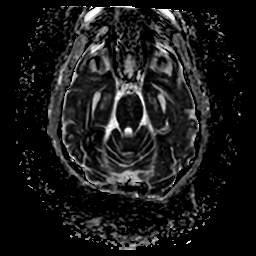
[im 33/50]
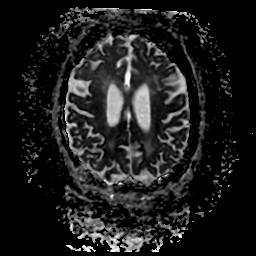
[im 50/50]
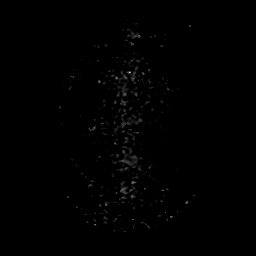

[Series 350: ADC · coronal · 4.0mm · 0.94mm/px · 3 of 37 slices shown (2 of 2)]
[im 1/37]
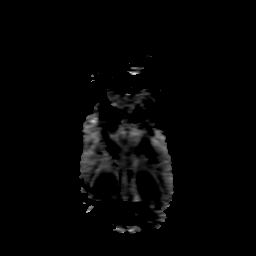
[im 19/37]
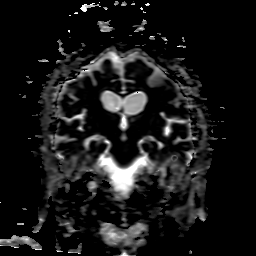
[im 37/37]
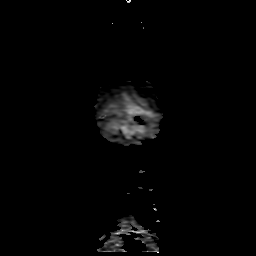

[24 of 48 positions shown; findings below may reference images not displayed]

FINDINGS: Brain: Small acute left thalamocapsular infarct. Encephalomalacia in
the right cerebellum with associated susceptibility artifact.
Smaller remote infarcts in the left cerebellum with some areas of
susceptibility artifact. Moderate scattered T2/FLAIR
hyperintensities in the supratentorial and infratentorial white
matter, nonspecific but most likely related to chronic microvascular
ischemic disease. Moderate atrophy with ex vacuo ventricular
dilation. No hydrocephalus. No evidence of acute hemorrhage. No
extra-axial fluid collections. No evidence of a mass lesion.

Vascular: Major arterial flow voids are maintained at the skull
base.

Skull and upper cervical spine: Normal marrow signal.

Sinuses/Orbits: Severe pansinusitis.

Other: Small bilateral mastoid effusions with bilateral middle ear
fluid.
IMPRESSION: 1. Small acute left thalamocapsular infarct. Faint edema without
mass effect.
2. Encephalomalacia in the right cerebellum with associated
susceptibility artifact, likely the sequela of prior infarct with
dystrophic calcification and/or prior hemorrhage. Smaller remote
infarcts in the left cerebellum.
3. Moderate chronic microvascular ischemic disease
4. Severe pansinusitis.
5. Small bilateral mastoid effusions with bilateral middle ear
fluid.

## 2021-05-09 IMAGING — CT CT HEAD W/O CM
4 of 5 series · 15 of 47 positions shown, 17 images · non-contrast
Comparison: CT head [DATE].

CLINICAL DATA: Neuro deficit, acute stroke suspected. Altered
mental status.

EXAM:
CT HEAD WITHOUT CONTRAST
TECHNIQUE: Contiguous axial images were obtained from the base of the skull
through the vertex without intravenous contrast.

[Series 3: head without · axial · non-contrast · 0.47mm/px · z∈[-134,-44]mm · 4 of 31 slices shown (1 of 2)]
[im 7/31  brain]
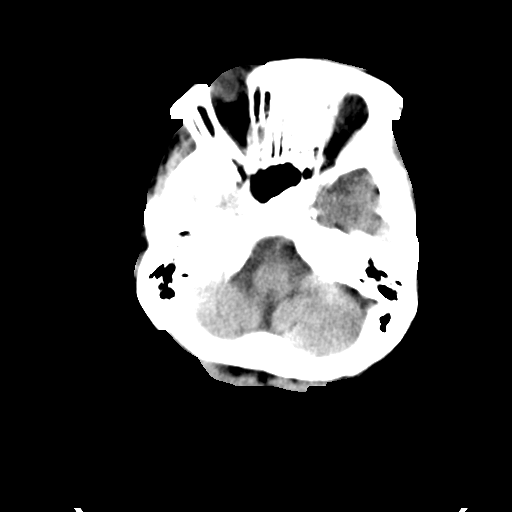
[im 13/31  brain]
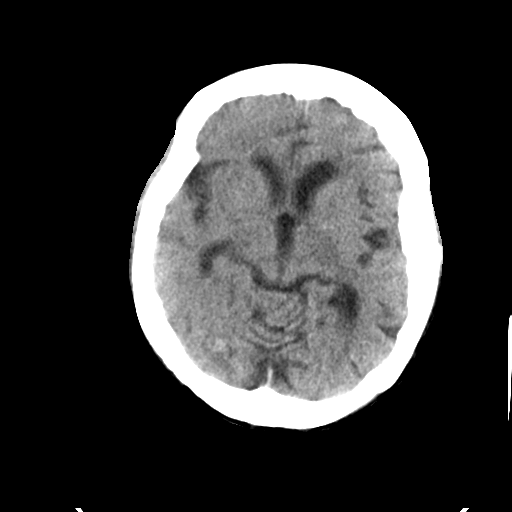
[im 19/31  brain]
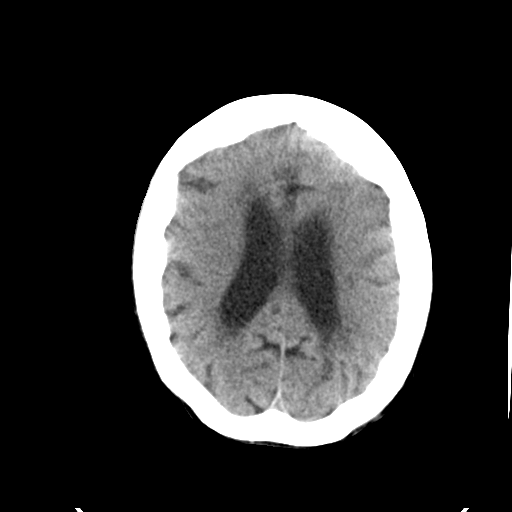
[im 25/31  brain]
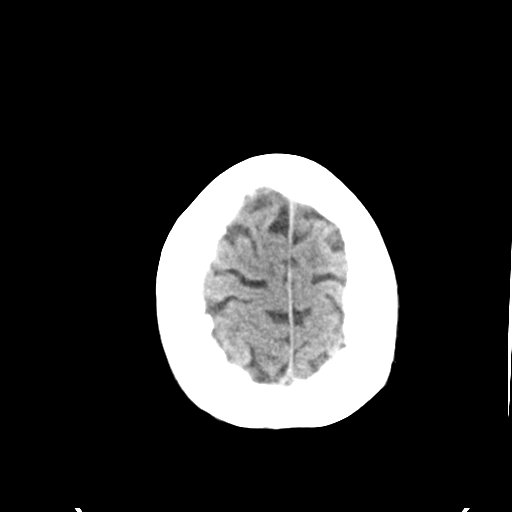

[Series 4: head without · axial · non-contrast · 0.47mm/px · z∈[-139,-39]mm · 5 of 31 slices shown, 7 images (2 of 2)]
[im 6/31  brain]
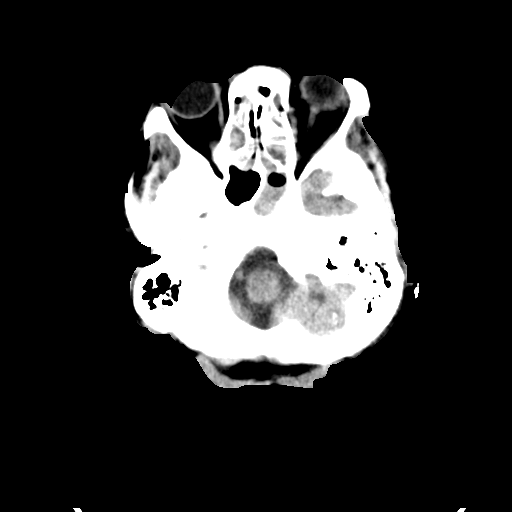
[im 6/31  bone]
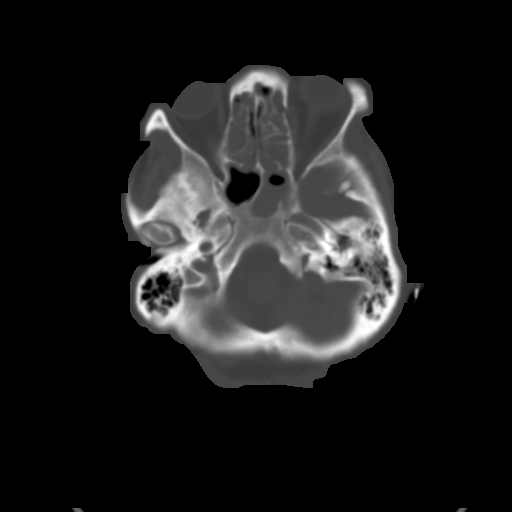
[im 11/31  brain]
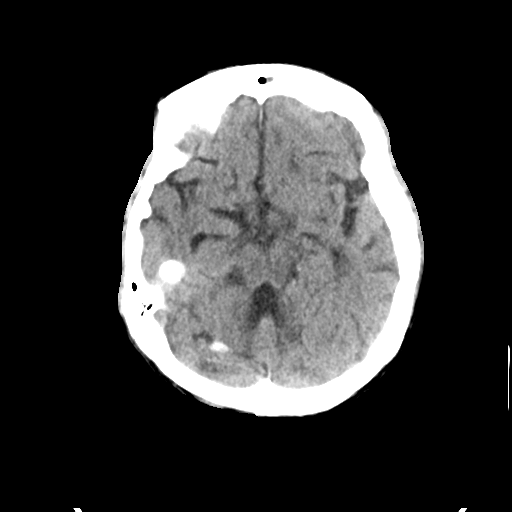
[im 16/31  brain]
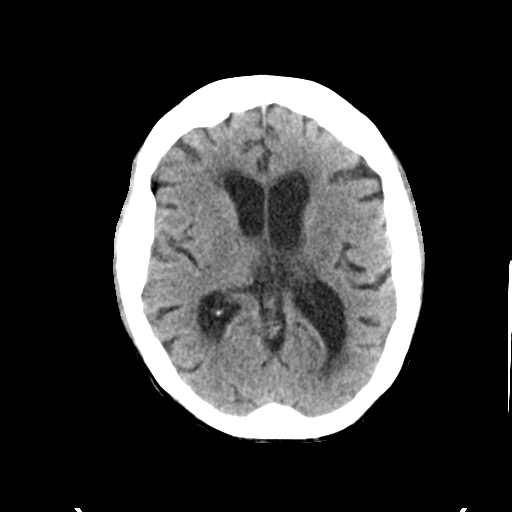
[im 21/31  brain]
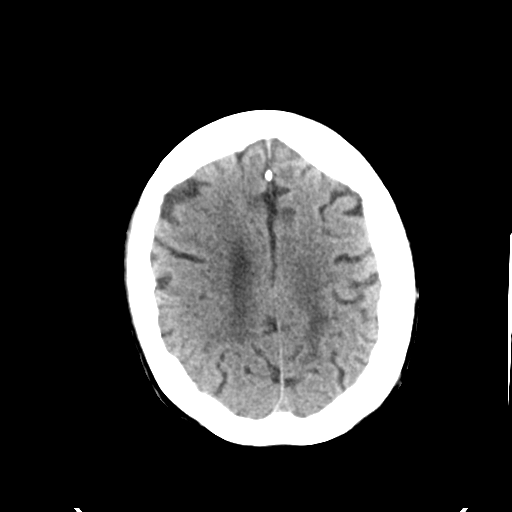
[im 26/31  brain]
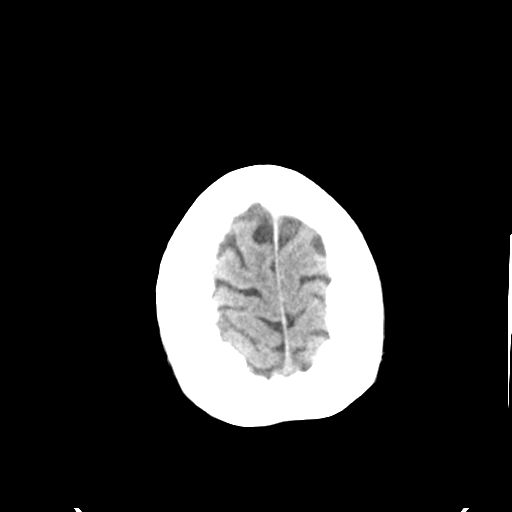
[im 26/31  bone]
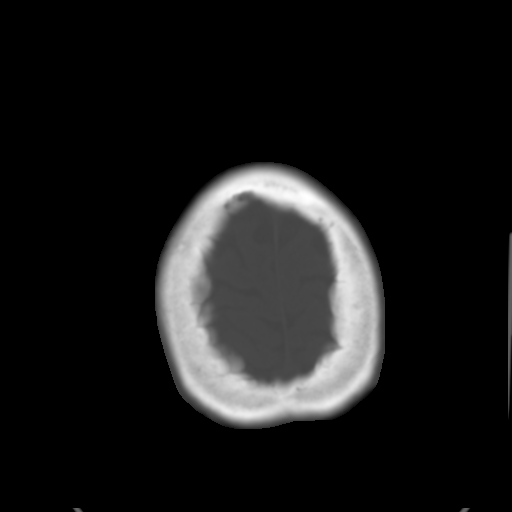

[Series 6: head without cor · coronal · non-contrast · 0.35mm/px · 3 of 66 slices shown]
[im 22/66  brain]
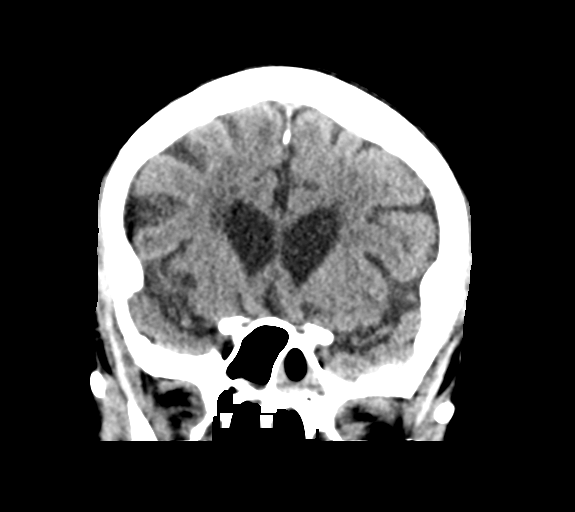
[im 29/66  brain]
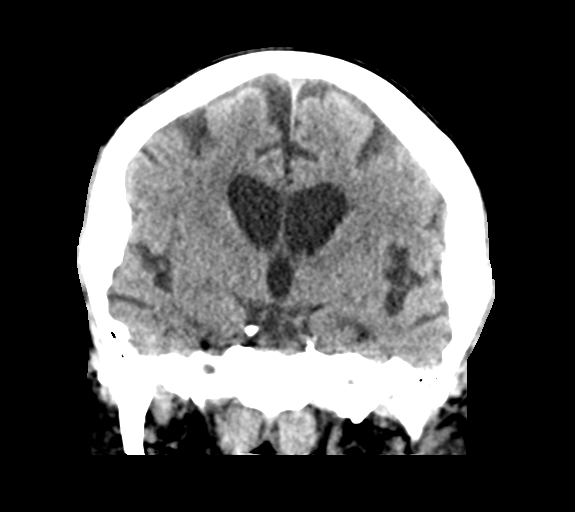
[im 37/66  brain]
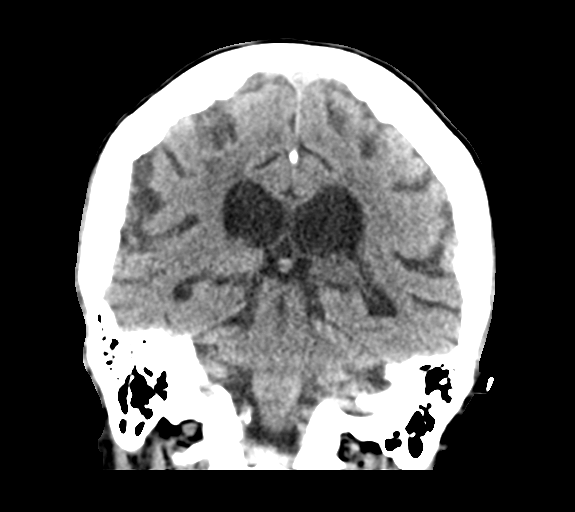

[Series 7: head without sag · sagittal · non-contrast · 0.34mm/px · 3 of 67 slices shown]
[im 23/67  brain]
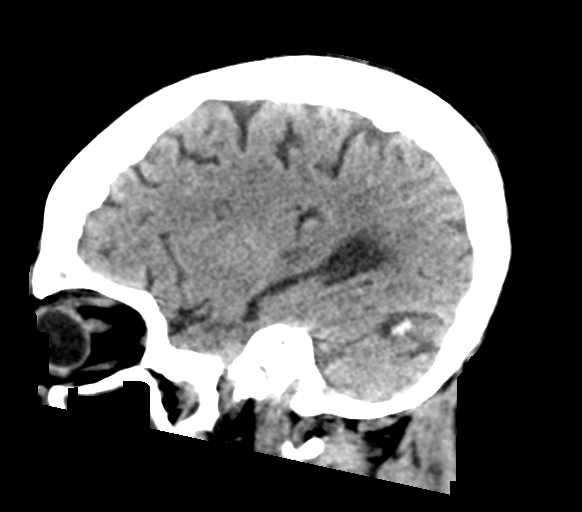
[im 34/67  brain]
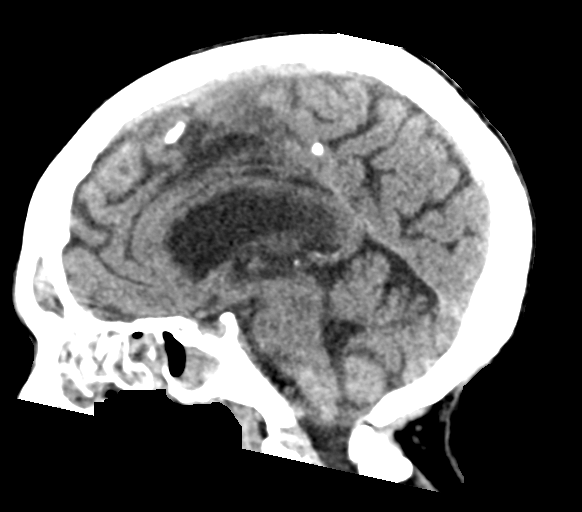
[im 45/67  brain]
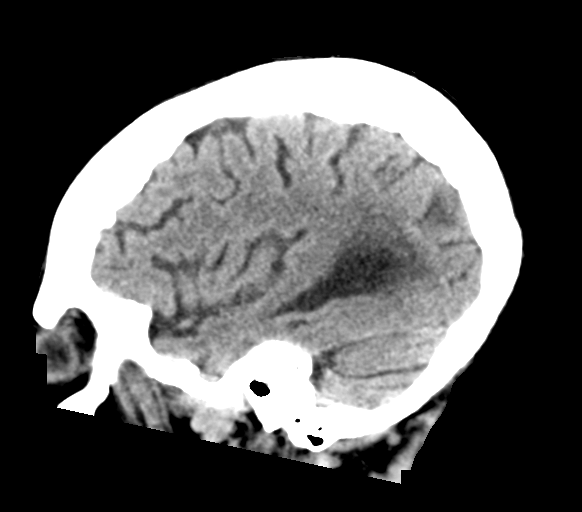

[15 of 47 positions shown; findings below may reference images not displayed]

FINDINGS: Brain: New amorphous 1.6 x 1.6 x 1.1 cm area of hyperdensity within
the right cerebellar hemisphere. The surrounding hypodensity appears
to be encephalomalacia rather than edema. Also, there is no
significant mass effect. No evidence acute large vascular territory
infarct. No midline shift. Basal cisterns are patent. Generalized
atrophy with ex vacuo ventricular dilation, progressed from the
prior. No hydrocephalus. No extra-axial fluid collection. Small
remote left cerebellar lacunar infarct with suspected additional
small area of dystrophic calcification in the inferior left
cerebellum. Mild patchy white matter hypoattenuation, likely the
sequela of chronic microvascular ischemic disease.

Vascular: No hyperdense vessel identified. Calcific intracranial
atherosclerosis.

Skull: No acute fracture.

Sinuses/Orbits: Severe pansinus disease with complete opacification
of bilateral frontal sinuses, near complete opacification of the
ethmoid air cells and left sphenoid sinus and moderate mucosal
thickening of the remaining sinuses.

Other: Trace bilateral mastoid fluid with right greater than left
middle ear fluid.
IMPRESSION: 1. New 1.6 cm amorphous area of hyperdensity within the right
cerebellar hemisphere, which has an appearance suggestive of an area
of dystrophic calcification from prior insult. The appearance is
atypical for acute hemorrhage, particularly given suggestion of
surrounding encephalomalacia and no significant mass effect. An MRI
with contrast is recommended to further characterize and to exclude
a mass.
2. Small remote left cerebellar lacunar infarct with suspected
additional small area of calcification in the inferior left
cerebellum.
3. No evidence of acute large vascular territory infarct.
4. Moderate atrophy and mild chronic microvascular ischemic disease.
5. Severe pansinusitis.
6. Trace bilateral mastoid fluid with right greater than left middle
ear fluid.

Findings and recommendations discussed with Dr. TANIZA via
telephone at [DATE] p.m.

## 2021-05-09 MED ORDER — ASCORBIC ACID 500 MG PO TABS
500.0000 mg | ORAL_TABLET | Freq: Every day | ORAL | Status: DC
  Administered 2021-05-10 – 2021-05-19 (×10): 500 mg via ORAL
  Filled 2021-05-09 (×10): qty 1

## 2021-05-09 MED ORDER — PREDNISONE 50 MG PO TABS
50.0000 mg | ORAL_TABLET | Freq: Every day | ORAL | Status: DC
  Administered 2021-05-13: 50 mg via ORAL
  Filled 2021-05-09: qty 1

## 2021-05-09 MED ORDER — INSULIN ASPART 100 UNIT/ML IJ SOLN
0.0000 [IU] | Freq: Every day | INTRAMUSCULAR | Status: DC
  Administered 2021-05-11: 2 [IU] via SUBCUTANEOUS
  Administered 2021-05-12: 4 [IU] via SUBCUTANEOUS
  Administered 2021-05-13 – 2021-05-16 (×3): 5 [IU] via SUBCUTANEOUS

## 2021-05-09 MED ORDER — SODIUM CHLORIDE 0.9 % IV SOLN: 100.0000 mg | Freq: Every day | INTRAVENOUS | Status: DC

## 2021-05-09 MED ORDER — ADULT MULTIVITAMIN W/MINERALS CH
1.0000 | ORAL_TABLET | Freq: Every day | ORAL | Status: DC
  Administered 2021-05-10 – 2021-05-19 (×10): 1 via ORAL
  Filled 2021-05-09 (×10): qty 1

## 2021-05-09 MED ORDER — STROKE: EARLY STAGES OF RECOVERY BOOK: Freq: Once | Status: AC

## 2021-05-09 MED ORDER — INSULIN ASPART 100 UNIT/ML IJ SOLN
0.0000 [IU] | Freq: Three times a day (TID) | INTRAMUSCULAR | Status: DC
  Administered 2021-05-10: 2 [IU] via SUBCUTANEOUS
  Administered 2021-05-10: 1 [IU] via SUBCUTANEOUS
  Administered 2021-05-11: 3 [IU] via SUBCUTANEOUS
  Administered 2021-05-11 – 2021-05-12 (×3): 2 [IU] via SUBCUTANEOUS
  Administered 2021-05-12: 3 [IU] via SUBCUTANEOUS
  Administered 2021-05-12: 2 [IU] via SUBCUTANEOUS
  Administered 2021-05-13: 5 [IU] via SUBCUTANEOUS
  Administered 2021-05-13 – 2021-05-14 (×2): 2 [IU] via SUBCUTANEOUS
  Administered 2021-05-15: 1 [IU] via SUBCUTANEOUS
  Administered 2021-05-15: 15 [IU] via SUBCUTANEOUS
  Administered 2021-05-16: 2 [IU] via SUBCUTANEOUS
  Administered 2021-05-16: 7 [IU] via SUBCUTANEOUS
  Administered 2021-05-17: 2 [IU] via SUBCUTANEOUS
  Administered 2021-05-17 (×2): 3 [IU] via SUBCUTANEOUS
  Administered 2021-05-18: 5 [IU] via SUBCUTANEOUS
  Administered 2021-05-18: 2 [IU] via SUBCUTANEOUS
  Administered 2021-05-19: 1 [IU] via SUBCUTANEOUS

## 2021-05-09 MED ORDER — IPRATROPIUM-ALBUTEROL 20-100 MCG/ACT IN AERS
1.0000 | INHALATION_SPRAY | Freq: Four times a day (QID) | RESPIRATORY_TRACT | Status: DC
  Administered 2021-05-10 – 2021-05-14 (×17): 1 via RESPIRATORY_TRACT
  Filled 2021-05-09: qty 4

## 2021-05-09 MED ORDER — ZANUBRUTINIB 80 MG PO CAPS
2.0000 | ORAL_CAPSULE | Freq: Every day | ORAL | Status: DC
  Administered 2021-05-14: 2 via ORAL

## 2021-05-09 MED ORDER — GUAIFENESIN-DM 100-10 MG/5ML PO SYRP
10.0000 mL | ORAL_SOLUTION | ORAL | Status: DC | PRN
  Administered 2021-05-10 – 2021-05-17 (×8): 10 mL via ORAL
  Filled 2021-05-09 (×8): qty 10

## 2021-05-09 MED ORDER — SODIUM CHLORIDE 0.9 % IV SOLN
200.0000 mg | Freq: Once | INTRAVENOUS | Status: AC
  Administered 2021-05-10: 200 mg via INTRAVENOUS
  Filled 2021-05-09: qty 40

## 2021-05-09 MED ORDER — ZINC SULFATE 220 (50 ZN) MG PO CAPS
220.0000 mg | ORAL_CAPSULE | Freq: Every day | ORAL | Status: DC
  Administered 2021-05-10 – 2021-05-19 (×10): 220 mg via ORAL
  Filled 2021-05-09 (×10): qty 1

## 2021-05-09 MED ORDER — ATORVASTATIN CALCIUM 80 MG PO TABS
80.0000 mg | ORAL_TABLET | Freq: Every day | ORAL | Status: DC
  Administered 2021-05-10: 80 mg via ORAL
  Filled 2021-05-09: qty 1

## 2021-05-09 MED ORDER — METHYLPREDNISOLONE SODIUM SUCC 40 MG IJ SOLR
25.0000 mg | Freq: Two times a day (BID) | INTRAMUSCULAR | Status: AC
  Administered 2021-05-09 – 2021-05-12 (×6): 25 mg via INTRAVENOUS
  Filled 2021-05-09 (×7): qty 1

## 2021-05-09 NOTE — ED Notes (Signed)
Patient transported to MRI 

## 2021-05-09 NOTE — ED Notes (Signed)
IV team nurse at bedside. 

## 2021-05-09 NOTE — ED Notes (Signed)
IV team consulted due to multiple unsuccessful venipuncture .

## 2021-05-09 NOTE — ED Notes (Signed)
Patient transported to CT 

## 2021-05-09 NOTE — ED Provider Notes (Signed)
[facility] EMERGENCY DEPARTMENT Provider Note   CSN: [id number] Arrival date & time: 05/09/21  1100     History Chief Complaint  Patient presents with   Shortness of Breath    JEZABELLE CHISOLM is a 70 y.o. female.  HPI 70 year old female presents with difficulty walking.  Started yesterday morning shortly after getting up.  She feels generally weak and cannot walk straight.  No headache, dizziness, vision changes or focal weakness.  Tree of complaint and the triage note indicates shortness of breath but she denies this.  There is no chest pain.  No fevers or cough.  Past Medical History:  Diagnosis Date   Chronic kidney disease    Diabetes mellitus without complication ([place])    History of leukemia    Hypertension     Patient Active Problem List   Diagnosis Date Noted   Iron deficiency anemia 01/06/2021   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/2021   CKD stage 5 secondary to hypertension ([place]) 07/29/2016   HLD (hyperlipidemia) 07/19/2015   HTN (hypertension) 07/19/2015   DM type 2 (diabetes mellitus, type 2) ([place]) 07/19/2015   CLL (chronic lymphocytic leukemia) ([place])     Past Surgical History:  Procedure Laterality Date   ABDOMINAL HYSTERECTOMY  1991   partial   CESAREAN SECTION     x 3     OB History   No obstetric history on file.     Family History  Problem Relation Age of Onset   Hyperlipidemia Mother    Diabetes Mother    Diabetes Father    Hyperlipidemia Father     Social History   Tobacco Use   Smoking status: Never   Smokeless tobacco: Never  Vaping Use   Vaping Use: Never used  Substance Use Topics   Alcohol use: Yes    Alcohol/week: 0.0 standard drinks    Comment: occassional   Drug use: No    Home Medications Prior to Admission medications   Medication Sig Start Date End Date Taking? Authorizing Provider  amLODipine (NORVASC) 10 MG tablet Take 1 tablet (10 mg total) by mouth daily. 07/19/15   Jearld Fenton, NP  atorvastatin (LIPITOR)  10 MG tablet Take 5 mg by mouth daily.    [provider]  carvedilol (COREG) 12.5 MG tablet Take 37.5 mg by mouth daily.    [provider]  CHLORAMBUCIL PO Take by mouth.    [provider]  Cyanocobalamin (VITAMIN B-12) 5000 MCG TBDP Take 5,000 mcg by mouth daily.    [provider]  ferrous sulfate 325 (65 FE) MG EC tablet Take 325 mg by mouth every Monday, Wednesday, and Friday.    [provider]  hydrALAZINE (APRESOLINE) 25 MG tablet Take 25 mg by mouth 3 (three) times daily.  02/08/17   [provider]  hydrocortisone cream 1 % Apply to affected area 2 times daily 12/26/20 12/26/21  [provider]  lidocaine (XYLOCAINE) 5 % ointment APPLY MODERATE AMOUNT TOPICALLY TWO TIMES A DAY AS NEEDED APPLY TO RECTAL AREA FOR PAIN 06/01/20   [provider]  losartan (COZAAR) 50 MG tablet Take 50 mg by mouth daily.     [provider]  meclizine (ANTIVERT) 25 MG tablet Take 1 tablet (25 mg total) by mouth 3 (three) times daily as needed for dizziness. 01/12/19   Mosetta Anis, MD  NIFEdipine (ADALAT CC) 60 MG 24 hr tablet Take 60 mg by mouth daily.    [provider]  Zanubrutinib (BRUKINSA) 80 MG CAPS Take 2 capsules by mouth daily.    [provider]    Allergies    Lisinopril  Review of Systems   Review of Systems  Constitutional:  Negative for fever.  Respiratory:  Negative for shortness of breath.   Cardiovascular:  Negative for chest pain.  Musculoskeletal:  Positive for gait problem.  Neurological:  Positive for weakness. Negative for dizziness and headaches.  All other systems reviewed and are negative.  Physical Exam Updated Vital Signs BP 139/65   Pulse 72   Temp 98.6 F (37 C) (Oral)   Resp (!) 21   Ht 5' (1.524 m)   Wt 49.9 kg   LMP  (LMP Unknown)   SpO2 100%   BMI 21.48 kg/m   Physical Exam Vitals and nursing note reviewed.  Constitutional:      General: She is not in acute  distress.    Appearance: She is well-developed. She is not ill-appearing or diaphoretic.  HENT:     Head: Normocephalic and atraumatic.     Right Ear: External ear normal.     Left Ear: External ear normal.     Nose: Nose normal.  Eyes:     General:        Right eye: No discharge.        Left eye: No discharge.     Extraocular Movements: Extraocular movements intact.     Pupils: Pupils are equal, round, and reactive to light.  Cardiovascular:     Rate and Rhythm: Normal rate and regular rhythm.     Heart sounds: Normal heart sounds.  Pulmonary:     Effort: Pulmonary effort is normal.     Breath sounds: Normal breath sounds.  Abdominal:     Palpations: Abdomen is soft.     Tenderness: There is no abdominal tenderness.  Skin:    General: Skin is warm and dry.  Neurological:     Mental Status: She is alert and oriented to person, place, and time.     Comments: CN 3-12 grossly intact. 5/5 strength in all 4 extremities. Grossly normal sensation. Normal finger to nose.  However when she tries to walk she's shuffling and feels like she can't go but a few steps  Psychiatric:        Mood and Affect: Mood is not anxious.    ED Results / Procedures / Treatments   Labs (all labs ordered are listed, but only abnormal results are displayed) Labs Reviewed  BASIC METABOLIC PANEL - Abnormal; Notable for the following components:      Result Value   Glucose, Bld 160 (*)    BUN 45 (*)    Creatinine, Ser 6.21 (*)    Calcium 8.6 (*)    GFR, Estimated 7 (*)    All other components within normal limits  CBC - Abnormal; Notable for the following components:   RBC 2.95 (*)    Hemoglobin 8.1 (*)    HCT 25.9 (*)    RDW 17.6 (*)    Platelets 24 (*)    All other components within normal limits  CBC WITH DIFFERENTIAL/PLATELET - Abnormal; Notable for the following components:   RBC 3.10 (*)    Hemoglobin 8.4 (*)    HCT 27.1 (*)    RDW 17.7 (*)    Platelets 26 (*)    Abs Immature Granulocytes  0.11 (*)    All other components within normal limits  IMMATURE PLATELET FRACTION - Abnormal;  Notable for the following components:   Immature Platelet Fraction 26.5 (*)    All other components within normal limits  RESP PANEL BY RT-PCR (FLU A&B, COVID) ARPGX2  URINALYSIS, ROUTINE W REFLEX MICROSCOPIC  RAPID URINE DRUG SCREEN, HOSP PERFORMED  TYPE AND SCREEN  ABO/RH  TROPONIN I (HIGH SENSITIVITY)  TROPONIN I (HIGH SENSITIVITY)    EKG EKG Interpretation  Date/Time:  Tuesday May 09 2021 11:02:35 EDT Ventricular Rate:  71 PR Interval:  145 QRS Duration: 76 QT Interval:  437 QTC Calculation: 475 R Axis:   24 Text Interpretation: Sinus rhythm Left ventricular hypertrophy Probable anterior infarct, old Borderline T abnormalities, inferior leads Otherwise no significant change Confirmed by Deno Etienne [phone]) on 05/09/2021 3:12:34 PM  Radiology CT Head Wo Contrast  Result Date: 05/09/2021 CLINICAL DATA:  Neuro deficit, acute stroke suspected. Altered mental status. EXAM: CT HEAD WITHOUT CONTRAST TECHNIQUE: Contiguous axial images were obtained from the base of the skull through the vertex without intravenous contrast. COMPARISON:  CT head January 12, 2019. FINDINGS: Brain: New amorphous 1.6 x 1.6 x 1.1 cm area of hyperdensity within the right cerebellar hemisphere. The surrounding hypodensity appears to be encephalomalacia rather than edema. Also, there is no significant mass effect. No evidence acute large vascular territory infarct. No midline shift. Basal cisterns are patent. Generalized atrophy with ex vacuo ventricular dilation, progressed from the prior. No hydrocephalus. No extra-axial fluid collection. Small remote left cerebellar lacunar infarct with suspected additional small area of dystrophic calcification in the inferior left cerebellum. Mild patchy white matter hypoattenuation, likely the sequela of chronic microvascular ischemic disease. Vascular: No hyperdense vessel identified.  Calcific intracranial atherosclerosis. Skull: No acute fracture. Sinuses/Orbits: Severe pansinus disease with complete opacification of bilateral frontal sinuses, near complete opacification of the ethmoid air cells and left sphenoid sinus and moderate mucosal thickening of the remaining sinuses. Other: Trace bilateral mastoid fluid with right greater than left middle ear fluid. IMPRESSION: 1. New 1.6 cm amorphous area of hyperdensity within the right cerebellar hemisphere, which has an appearance suggestive of an area of dystrophic calcification from prior insult. The appearance is atypical for acute hemorrhage, particularly given suggestion of surrounding encephalomalacia and no significant mass effect. An MRI with contrast is recommended to further characterize and to exclude a mass. 2. Small remote left cerebellar lacunar infarct with suspected additional small area of calcification in the inferior left cerebellum. 3. No evidence of acute large vascular territory infarct. 4. Moderate atrophy and mild chronic microvascular ischemic disease. 5. Severe pansinusitis. 6. Trace bilateral mastoid fluid with right greater than left middle ear fluid. Findings and recommendations discussed with Dr. Regenia Skeeter via telephone at 1:30 p.m. Electronically Signed   By: Margaretha Sheffield MD   On: 05/09/2021 14:00   DG Chest Portable 1 View  Result Date: 05/09/2021 CLINICAL DATA:  Shortness of breath, generalized weakness, unable to talk in complete sentences, end-stage renal disease on dialysis, missed dialysis today due to weakness EXAM: PORTABLE CHEST 1 VIEW COMPARISON:  Portable exam 1123 hours compared to 01/12/2019 FINDINGS: Enlargement of cardiac silhouette with pulmonary vascular congestion. Atherosclerotic calcification aorta. Mild interstitial infiltrates consistent with pulmonary edema and CHF versus fluid overload. LEFT pleural effusion and basilar atelectasis present. No pneumothorax or acute osseous findings.  IMPRESSION: Enlargement of cardiac silhouette with pulmonary vascular congestion and mild diffuse pulmonary edema. LEFT pleural effusion and LEFT basilar atelectasis. Aortic Atherosclerosis (ICD10-I70.0). Electronically Signed   By: Lavonia Dana M.D.   On: 05/09/2021 11:53  Procedures Procedures   Medications Ordered in ED Medications - No data to display  ED Course  I have reviewed the triage vital signs and the nursing notes.  Pertinent labs & imaging results that were available during my care of the patient were reviewed by me and considered in my medical decision making (see chart for details).    MDM Rules/Calculators/A&P                          Given difficulty with walking labs and CT obtained. CT is abnormal, unclear if hemorrhage, mass or calcification. D/w Neuro, will get MRI w/wo. Unfortunately, nephrology recommends no gadolinium due to it can't be dialyzed. Will get just mri w/o. Plan is to call Dr. Quinn Axe back after MRI and re-eval. Care to Dr. Tyrone Nine.  Final Clinical Impression(s) / ED Diagnoses Final diagnoses:  None    Rx / DC Orders ED Discharge Orders     None        Sherwood Gambler, MD 05/09/21 1554

## 2021-05-09 NOTE — ED Triage Notes (Signed)
Pt arrived via [facility] from home for c/o SOB, and generalized weakness that started today upon waking. {T able to talk in complete sentences. Pt goes to dialysis Tues, Thurs, Sat. Missed dialysis today due to the weakness. Pt is A&Ox4. Pt wears 2L 02 at home, pt husband increased pt to 3L 02 per [place]. VSS

## 2021-05-09 NOTE — H&P (Signed)
History and Physical  Christine Gibson:[id number] DOB: 1951/04/19 DOA: 05/09/2021  Referring physician: Dr. Tyrone Nine, [place]  PCP: Jearld Fenton, NP  Outpatient Specialists: Neurology Patient coming from: Home  Chief Complaint: Difficulty ambulating x 36 hours  HPI: Christine Gibson is a 70 y.o. female with medical history significant for ESRD on HD on TTS, CVA, CLL, thrombocytopenia, anemia of chronic disease, who presented to [facility] ED with complaints of R sided weakness and difficulty ambulating x 36 hours.  History is obtained from the patient and her husband via phone, poor historians.  CT head was abnormal, revealing new 1.6 cm amorphous area of hyperdensity within the right cerebellar hemisphere.  MRI brain revealed small acute left thalamocapsular infarct.  Faint edema without mass-effect.  Seen by neurology who recommended hospitalist admission for stroke work-up.  ED Course: T-max 98.6.  BP 142/79, pulse 83, respirations 16, O2 saturation 100% on 3 L.  Review of Systems: Review of systems as noted in the HPI. All other systems reviewed and are negative.   Past Medical History:  Diagnosis Date   Chronic kidney disease    Diabetes mellitus without complication ([place])    History of leukemia    Hypertension    Past Surgical History:  Procedure Laterality Date   ABDOMINAL HYSTERECTOMY  1991   partial   CESAREAN SECTION     x 3    Social History:  reports that she has never smoked. She has never used smokeless tobacco. She reports current alcohol use. She reports that she does not use drugs.   Allergies  Allergen Reactions   Lisinopril Cough    Family History  Problem Relation Age of Onset   Hyperlipidemia Mother    Diabetes Mother    Diabetes Father    Hyperlipidemia Father       Prior to Admission medications   Medication Sig Start Date End Date Taking? Authorizing Provider  amLODipine (NORVASC) 10 MG tablet Take 1 tablet (10 mg total) by mouth daily. 07/19/15    Jearld Fenton, NP  atorvastatin (LIPITOR) 10 MG tablet Take 5 mg by mouth daily.    [provider]  carvedilol (COREG) 12.5 MG tablet Take 37.5 mg by mouth daily.    [provider]  CHLORAMBUCIL PO Take by mouth.    [provider]  Cyanocobalamin (VITAMIN B-12) 5000 MCG TBDP Take 5,000 mcg by mouth daily.    [provider]  ferrous sulfate 325 (65 FE) MG EC tablet Take 325 mg by mouth every Monday, Wednesday, and Friday.    [provider]  hydrALAZINE (APRESOLINE) 25 MG tablet Take 25 mg by mouth 3 (three) times daily.  02/08/17   [provider]  hydrocortisone cream 1 % Apply to affected area 2 times daily 12/26/20 12/26/21  [provider]  lidocaine (XYLOCAINE) 5 % ointment APPLY MODERATE AMOUNT TOPICALLY TWO TIMES A DAY AS NEEDED APPLY TO RECTAL AREA FOR PAIN 06/01/20   [provider]  losartan (COZAAR) 50 MG tablet Take 50 mg by mouth daily.     [provider]  meclizine (ANTIVERT) 25 MG tablet Take 1 tablet (25 mg total) by mouth 3 (three) times daily as needed for dizziness. 01/12/19   Mosetta Anis, MD  NIFEdipine (ADALAT CC) 60 MG 24 hr tablet Take 60 mg by mouth daily.    [provider]  Zanubrutinib (BRUKINSA) 80 MG CAPS Take 2 capsules by mouth daily.    [provider]  Physical Exam: BP (!) 140/48   Pulse (!) 55   Temp 98.5 F (36.9 C) (Oral)   Resp 19   Ht 5' (1.524 m)   Wt 49.9 kg   LMP  (LMP Unknown)   SpO2 97%   BMI 21.48 kg/m   General: 69 y.o. year-old female well developed well nourished in no acute distress.  Alert and oriented x3. Cardiovascular: Regular rate and rhythm with no rubs or gallops.  No thyromegaly or JVD noted.  No lower extremity edema. 2/4 pulses in all 4 extremities. Respiratory: Diffuse rales bilaterally with poor inspiratory effort. Abdomen: Soft nontender nondistended with normal bowel sounds x4 quadrants. Muskuloskeletal: No cyanosis,  clubbing or edema noted bilaterally Neuro: CN II-XII intact, strength, sensation, reflexes Skin: No ulcerative lesions noted or rashes Psychiatry: Judgement and insight appear normal. Mood is appropriate for condition and setting          Labs on Admission:  Basic Metabolic Panel: Recent Labs  Lab 05/09/21 1138  NA 142  K 3.9  CL 105  CO2 27  GLUCOSE 160*  BUN 45*  CREATININE 6.21*  CALCIUM 8.6*   Liver Function Tests: No results for input(s): AST, ALT, ALKPHOS, BILITOT, PROT, ALBUMIN in the last 168 hours. No results for input(s): LIPASE, AMYLASE in the last 168 hours. No results for input(s): AMMONIA in the last 168 hours. CBC: Recent Labs  Lab 05/09/21 1138 05/09/21 1309 05/09/21 1956  WBC 7.6 7.8 8.5  NEUTROABS  --  3.9  --   HGB 8.1* 8.4* 8.6*  HCT 25.9* 27.1* 27.0*  MCV 87.8 87.4 87.4  PLT 24* 26* 25*   Cardiac Enzymes: No results for input(s): CKTOTAL, CKMB, CKMBINDEX, TROPONINI in the last 168 hours.  BNP (last 3 results) No results for input(s): BNP in the last 8760 hours.  ProBNP (last 3 results) No results for input(s): PROBNP in the last 8760 hours.  CBG: No results for input(s): GLUCAP in the last 168 hours.  Radiological Exams on Admission: CT Head Wo Contrast  Result Date: 05/09/2021 CLINICAL DATA:  Neuro deficit, acute stroke suspected. Altered mental status. EXAM: CT HEAD WITHOUT CONTRAST TECHNIQUE: Contiguous axial images were obtained from the base of the skull through the vertex without intravenous contrast. COMPARISON:  CT head January 12, 2019. FINDINGS: Brain: New amorphous 1.6 x 1.6 x 1.1 cm area of hyperdensity within the right cerebellar hemisphere. The surrounding hypodensity appears to be encephalomalacia rather than edema. Also, there is no significant mass effect. No evidence acute large vascular territory infarct. No midline shift. Basal cisterns are patent. Generalized atrophy with ex vacuo ventricular dilation, progressed from the  prior. No hydrocephalus. No extra-axial fluid collection. Small remote left cerebellar lacunar infarct with suspected additional small area of dystrophic calcification in the inferior left cerebellum. Mild patchy white matter hypoattenuation, likely the sequela of chronic microvascular ischemic disease. Vascular: No hyperdense vessel identified. Calcific intracranial atherosclerosis. Skull: No acute fracture. Sinuses/Orbits: Severe pansinus disease with complete opacification of bilateral frontal sinuses, near complete opacification of the ethmoid air cells and left sphenoid sinus and moderate mucosal thickening of the remaining sinuses. Other: Trace bilateral mastoid fluid with right greater than left middle ear fluid. IMPRESSION: 1. New 1.6 cm amorphous area of hyperdensity within the right cerebellar hemisphere, which has an appearance suggestive of an area of dystrophic calcification from prior insult. The appearance is atypical for acute hemorrhage, particularly given suggestion of surrounding encephalomalacia and no significant mass effect. An MRI with contrast is  recommended to further characterize and to exclude a mass. 2. Small remote left cerebellar lacunar infarct with suspected additional small area of calcification in the inferior left cerebellum. 3. No evidence of acute large vascular territory infarct. 4. Moderate atrophy and mild chronic microvascular ischemic disease. 5. Severe pansinusitis. 6. Trace bilateral mastoid fluid with right greater than left middle ear fluid. Findings and recommendations discussed with Dr. Regenia Skeeter via telephone at 1:30 p.m. Electronically Signed   By: Margaretha Sheffield MD   On: 05/09/2021 14:00   MR BRAIN WO CONTRAST  Result Date: 05/09/2021 EXAM: MRI HEAD WITHOUT CONTRAST TECHNIQUE: Multiplanar, multiecho pulse sequences of the brain and surrounding structures were obtained without intravenous contrast. COMPARISON:  Same day CT head. FINDINGS: Brain: Small acute  left thalamocapsular infarct. Encephalomalacia in the right cerebellum with associated susceptibility artifact. Smaller remote infarcts in the left cerebellum with some areas of susceptibility artifact. Moderate scattered T2/FLAIR hyperintensities in the supratentorial and infratentorial white matter, nonspecific but most likely related to chronic microvascular ischemic disease. Moderate atrophy with ex vacuo ventricular dilation. No hydrocephalus. No evidence of acute hemorrhage. No extra-axial fluid collections. No evidence of a mass lesion. Vascular: Major arterial flow voids are maintained at the skull base. Skull and upper cervical spine: Normal marrow signal. Sinuses/Orbits: Severe pansinusitis. Other: Small bilateral mastoid effusions with bilateral middle ear fluid. IMPRESSION: 1. Small acute left thalamocapsular infarct. Faint edema without mass effect. 2. Encephalomalacia in the right cerebellum with associated susceptibility artifact, likely the sequela of prior infarct with dystrophic calcification and/or prior hemorrhage. Smaller remote infarcts in the left cerebellum. 3. Moderate chronic microvascular ischemic disease 4. Severe pansinusitis. 5. Small bilateral mastoid effusions with bilateral middle ear fluid. Electronically Signed   By: Margaretha Sheffield MD   On: 05/09/2021 17:59   DG Chest Portable 1 View  Result Date: 05/09/2021 CLINICAL DATA:  Shortness of breath, generalized weakness, unable to talk in complete sentences, end-stage renal disease on dialysis, missed dialysis today due to weakness EXAM: PORTABLE CHEST 1 VIEW COMPARISON:  Portable exam 1123 hours compared to 01/12/2019 FINDINGS: Enlargement of cardiac silhouette with pulmonary vascular congestion. Atherosclerotic calcification aorta. Mild interstitial infiltrates consistent with pulmonary edema and CHF versus fluid overload. LEFT pleural effusion and basilar atelectasis present. No pneumothorax or acute osseous findings.  IMPRESSION: Enlargement of cardiac silhouette with pulmonary vascular congestion and mild diffuse pulmonary edema. LEFT pleural effusion and LEFT basilar atelectasis. Aortic Atherosclerosis (ICD10-I70.0). Electronically Signed   By: Lavonia Dana M.D.   On: 05/09/2021 11:53    EKG: I independently viewed the EKG done and my findings are as followed: Sinus rhythm rate of 71.  Nonspecific ST-T changes.  QTc 475.  Assessment/Plan Present on Admission:  Acute CVA (cerebrovascular accident) [facility])  Active Problems:   Acute CVA (cerebrovascular accident) ([place])  Acute CVA, POA. MRI brain showed small acute left thalamocapsular infarct without mass-effect. Admitted for stroke work-up MRA head and neck 2D echo with bubble study Neurochecks every 4 hours A1c, fasting lipid panel PT/OT/speech evaluation Aspiration/fall precautions Permissive hypertension Treat SBP greater than 220 or DBP greater than 120. Neurology/stroke team consulted  ESRD on HD TTS Nephrology consulted for possible HD on 05/10/2021  Acute CHF Elevated BNP greater than 3000 Volume status and electrolytes managed with hemodialysis  COVID-19 viral infection Self-reported positive COVID-19 screening test 03/15/2021 at outside facility Positive COVID test on 05/09/2021. Infiltrates on chest x-ray and new hypoxia IV remdesivir, IV Solu-Medrol Inflammatory markers Incentive spirometer Bronchodilators  Hyperlipidemia Resume home  Lipitor Follow fasting lipid  CLL Thrombocytopenia Resume home regimen  Anemia of chronic disease Hemoglobin at baseline No overt bleeding Continue to monitor H&H    DVT prophylaxis: SCDs  Code Status: Full code  Family Communication: Updated her husband via phone.  Disposition Plan: Admitted to telemetry medical  Consults called: Nephrology  Admission status: Inpatient status.  Patient will require at least 2 midnights for further evaluation and treatment.    Status is:  Inpatient    Dispo:  Patient From: Home  Planned Disposition: Home, possibly on 05/12/21.  Medically stable for discharge: No         Kayleen Memos MD [facility] Pager [phone]  If 7PM-7AM, please contact night-coverage [email or web address] Password [facility]  05/09/2021, 11:38 PM

## 2021-05-09 NOTE — ED Provider Notes (Signed)
I see the patient in signout from Dr. Regenia Skeeter, briefly the patient is a 70 year old female with a chief complaints of dizziness and difficulty walking.  CT scan with an abnormal finding and discussion with neurology recommended an MRI.  MRI shows a stroke in a different area of the brain.  Consistent with her inability to walk.  Neurology to follow recommended medical admission.   Deno Etienne, DO 05/09/21 2028

## 2021-05-09 NOTE — Progress Notes (Signed)
Nephrology Quick Note:  S: Called that Ms. Placido is in the ED with weakness, dyspnea, and abnormal head CT. She dialyzes on TTS schedule - due today. No acute dyspnea or labs abnormalities.  O: Blood pressure (!) 146/69, pulse 75, temperature 98.6 F (37 C), temperature source Oral, resp. rate (!) 22, height 5' (1.524 m), weight 49.9 kg, SpO2 100 %.  GEN: Hard of hearing, NAD. Nasal O2 in place. CV: RRR PULM: CTAB EXTREM: No LE edema ACCESS: LUE AVF + bruit with whistle  HD orders: TTS [place] 3:30, 450/500, EDW 44.5kg, 3K/2.5Ca, AVG, heparin 3000, Hectoral 89mcg IV q HD, Mircera 170mcg IV q 2 weeks (last 7/9)  A/P: ESRD: Due for dialysis today. I have placed orders in her chart -- will likely be a while before we can get to her. She is stable from lab standpoint. Does have mild pulm edema on imaging, but comfortable on nasal O2 for now - UF as tolerated. Abnormal head CT/R cerebellar mass -> reviewing "care everywhere" this was seen on brain MRI in 01/2021. Neuro consult pending. Repeat MRI ordered here. Thrombocytopenia/CLL  Full consult to follow if she is admitted.  Veneta Penton, PA-C [employer] Pager [phone]

## 2021-05-09 NOTE — Consult Note (Signed)
NEUROLOGY CONSULTATION NOTE   Date of service: May 09, 2021 Patient Name: Christine Gibson MRN:  [id number] DOB:  13-Aug-1951 Reason for consult: acute infarct on MRI brain Requesting physician: Dr. Sherwood Gambler _ _ _   _ __   _ __ _ _  __ __   _ __   __ _  History of Present Illness   70 yo woman with hx CKD, HTN, DM, CLL currently active c/b anemia and thrombocytopenia on zanubrutinib, prior cerebellar stroke per patient p/w R sided weakness, LH, difficulty ambulating x36 hrs. MRI brain showed small acute L thalamocapsular infarct without mass effect. She also has remote infarct in L cerebellum and encephalomalacia in R cerebellum w/ susceptibility artifact prior hemorrhage vs dystrophic calcification. She is not on [facility] or antiplatelet at home. States she was taken off ASA by her oncologist at [facility]. Hgb 8.6, platelets 25 in ED. COVID+   ROS   Per HPI; all other systems reviewed and are negative  Past History   Past Medical History:  Diagnosis Date   Chronic kidney disease    Diabetes mellitus without complication ([place])    History of leukemia    Hypertension    Past Surgical History:  Procedure Laterality Date   ABDOMINAL HYSTERECTOMY  1991   partial   CESAREAN SECTION     x 3   Family History  Problem Relation Age of Onset   Hyperlipidemia Mother    Diabetes Mother    Diabetes Father    Hyperlipidemia Father    Social History   Socioeconomic History   Marital status: Married    Spouse name: Not on file   Number of children: Not on file   Years of education: Not on file   Highest education level: Not on file  Occupational History   Not on file  Tobacco Use   Smoking status: Never   Smokeless tobacco: Never  Vaping Use   Vaping Use: Never used  Substance and Sexual Activity   Alcohol use: Yes    Alcohol/week: 0.0 standard drinks    Comment: occassional   Drug use: No   Sexual activity: Yes  Other Topics Concern   Not on file  Social History Narrative    [employer]. L knee pain was service related.  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     Social Determinants of Health   Financial Resource Strain: Not on file  Food Insecurity: Not on file  Transportation Needs: Not on file  Physical Activity: Not on file  Stress: Not on file  Social Connections: Not on file   Allergies  Allergen Reactions   Lisinopril Cough    Medications   No current facility-administered medications for this encounter.  Current Outpatient Medications:    amLODipine (NORVASC) 10 MG tablet, Take 1 tablet (10 mg total) by mouth daily., Disp: 90 tablet, Rfl: 1   atorvastatin (LIPITOR) 10 MG tablet, Take 5 mg by mouth daily., Disp: , Rfl:    carvedilol (COREG) 12.5 MG tablet, Take 37.5 mg by mouth daily., Disp: , Rfl:    CHLORAMBUCIL PO, Take by mouth., Disp: , Rfl:    Cyanocobalamin (VITAMIN B-12) 5000 MCG TBDP, Take 5,000 mcg by mouth daily., Disp: , Rfl:    ferrous sulfate 325 (65 FE) MG EC tablet, Take 325 mg by mouth every Monday, Wednesday, and Friday., Disp: , Rfl:    hydrALAZINE (APRESOLINE) 25 MG tablet, Take 25 mg by mouth 3 (three) times daily. , Disp: , Rfl:  hydrocortisone cream 1 %, Apply to affected area 2 times daily, Disp: , Rfl:    lidocaine (XYLOCAINE) 5 % ointment, APPLY MODERATE AMOUNT TOPICALLY TWO TIMES A DAY AS NEEDED APPLY TO RECTAL AREA FOR PAIN, Disp: , Rfl:    losartan (COZAAR) 50 MG tablet, Take 50 mg by mouth daily. , Disp: , Rfl:    meclizine (ANTIVERT) 25 MG tablet, Take 1 tablet (25 mg total) by mouth 3 (three) times daily as needed for dizziness., Disp: 30 tablet, Rfl: 0   NIFEdipine (ADALAT CC) 60 MG 24 hr tablet, Take 60 mg by mouth daily., Disp: , Rfl:    Zanubrutinib (BRUKINSA) 80 MG CAPS, Take 2 capsules by mouth daily., Disp: , Rfl:      Vitals   Vitals:   05/09/21 1830 05/09/21 1900 05/09/21 1945 05/09/21 2200  BP: (!) 153/63 (!) 154/67 (!) 148/64 138/70  Pulse: 72 80 79 78  Resp: 20 18 16  (!) 26  Temp:      TempSrc:      SpO2: 97%  96% 98% 98%  Weight:      Height:         Body mass index is 21.48 kg/m.  Physical Exam   Physical Exam Gen: A&O x4, NAD HEENT: Atraumatic, normocephalic;mucous membranes moist; oropharynx clear, tongue without atrophy or fasciculations. Neck: Supple, trachea midline. Resp: CTAB, no w/r/r CV: RRR, no m/g/r; nml S1 and S2. 2+ symmetric peripheral pulses. Abd: soft/NT/ND; nabs x 4 quad Extrem: Nml bulk; no cyanosis, clubbing, or edema.  Neuro: *MS: A&O x4. Follows multi-step commands.  *Speech: fluid, nondysarthric, able to name and repeat *CN:    I: Deferred   II,III: PERRLA, VFF by confrontation, optic discs not visualized 2/2 pupillary constriction   III,IV,VI: EOMI w/o nystagmus, no ptosis   V: Sensation intact from V1 to V3 to LT   VII: Eyelid closure was full.  R NLF flattening   VIII: Hearing intact to voice   IX,X: Voice normal, palate elevates symmetrically    XI: SCM/trap 5/5 bilat   XII: Tongue protrudes midline, no atrophy or fasciculations  *Motor:   Normal bulk.  No tremor, rigidity or bradykinesia. LUE and LLE full strength. RUE drift with decreased tricep and grip strength 4/5. RLE barely anti-gravity. *Sensory: Intact to light touch, pinprick, temperature vibration throughout. Symmetric. Propioception intact bilat.  No double-simultaneous extinction.  *Coordination:  FNF intact bilat *Reflexes:  1+ and symmetric throughout without clonus; toes down-going bilat *Gait: deferred  NIHSS = 4 (1 facial palsy, 1 RUE, 2 RLE)   Premorbid mRS = 2   Labs   CBC:  Recent Labs  Lab 05/09/21 1309 05/09/21 1956  WBC 7.8 8.5  NEUTROABS 3.9  --   HGB 8.4* 8.6*  HCT 27.1* 27.0*  MCV 87.4 87.4  PLT 26* 25*    Basic Metabolic Panel:  Lab Results  Component Value Date   NA 142 05/09/2021   K 3.9 05/09/2021   CO2 27 05/09/2021   GLUCOSE 160 (H) 05/09/2021   BUN 45 (H) 05/09/2021   CREATININE 6.21 (H) 05/09/2021   CALCIUM 8.6 (L) 05/09/2021   GFRNONAA 7 (L)  05/09/2021   GFRAA 12 (L) 01/12/2019   Lipid Panel:  Lab Results  Component Value Date   LDLCALC 59 01/06/2021   HgbA1c:  Lab Results  Component Value Date   HGBA1C 5.6 01/06/2021   Urine Drug Screen: No results found for: LABOPIA, COCAINSCRNUR, LABBENZ, AMPHETMU, THCU, LABBARB  Alcohol Level No  results found for: [place]    Impression   70 yo woman with hx CKD, HTN, DM, CLL currently active c/b anemia and thrombocytopenia on zanubrutinib, prior cerebellar stroke per patient p/w R sided weakness, LH, difficulty ambulating x36 hrs found to have an acute L thalamocapsular stroke. Thrombocytopenic with plts 25. COVID+  Recommendations   - Admit to hospitalist service for stroke w/u; stroke team will consult - Permissive HTN x48 hrs from sx onset or until stroke ruled out by MRI goal BP <220/110. PRN labetalol or hydralazine if BP above these parameters. Avoid oral antihypertensives. - MRA H&N - TTE - Check A1c and LDL - Continue atorvastatin 80mg  daily - Hold antiplatelets in setting of severe thrombocytopenia - q4 hr neuro checks - STAT head CT for any change in neuro exam - Tele - PT/OT/SLP - Stroke education - Amb referral to neurology upon discharge  Stroke team will continue to follow.   ______________________________________________________________________   Thank you for the opportunity to take part in the care of this patient. If you have any further questions, please contact the neurology consultation attending.  Signed,  Su Monks, MD [facility] [phone]  If 7pm- 7am, please page neurology on call as listed in [place].

## 2021-05-10 ENCOUNTER — Inpatient Hospital Stay (HOSPITAL_COMMUNITY): Payer: Medicare Other

## 2021-05-10 ENCOUNTER — Other Ambulatory Visit (HOSPITAL_COMMUNITY): Payer: Medicare HMO

## 2021-05-10 DIAGNOSIS — D696 Thrombocytopenia, unspecified: Secondary | ICD-10-CM | POA: Diagnosis not present

## 2021-05-10 DIAGNOSIS — I6389 Other cerebral infarction: Secondary | ICD-10-CM | POA: Diagnosis not present

## 2021-05-10 DIAGNOSIS — I639 Cerebral infarction, unspecified: Secondary | ICD-10-CM | POA: Diagnosis not present

## 2021-05-10 LAB — RENAL FUNCTION PANEL
Albumin: 2.6 g/dL — ABNORMAL LOW (ref 3.5–5.0)
Anion gap: 13 (ref 5–15)
BUN: 52 mg/dL — ABNORMAL HIGH (ref 8–23)
CO2: 23 mmol/L (ref 22–32)
Calcium: 8.3 mg/dL — ABNORMAL LOW (ref 8.9–10.3)
Chloride: 105 mmol/L (ref 98–111)
Creatinine, Ser: 6.66 mg/dL — ABNORMAL HIGH (ref 0.44–1.00)
GFR, Estimated: 6 mL/min — ABNORMAL LOW (ref >60)
Glucose, Bld: 173 mg/dL — ABNORMAL HIGH (ref 70–99)
Phosphorus: 5.5 mg/dL — ABNORMAL HIGH (ref 2.5–4.6)
Potassium: 4.5 mmol/L (ref 3.5–5.1)
Sodium: 141 mmol/L (ref 135–145)

## 2021-05-10 LAB — HEMOGLOBIN A1C
Hgb A1c MFr Bld: 6 % — ABNORMAL HIGH (ref 4.8–5.6)
Mean Plasma Glucose: 125.5 mg/dL

## 2021-05-10 LAB — LIPID PANEL
Cholesterol: 112 mg/dL (ref 0–200)
HDL: 18 mg/dL — ABNORMAL LOW (ref >40)
LDL Cholesterol: 67 mg/dL (ref 0–99)
Total CHOL/HDL Ratio: 6.2 RATIO
Triglycerides: 136 mg/dL (ref <150)
VLDL: 27 mg/dL (ref 0–40)

## 2021-05-10 LAB — CBG MONITORING, ED: Glucose-Capillary: 157 mg/dL — ABNORMAL HIGH (ref 70–99)

## 2021-05-10 LAB — CBC WITH DIFFERENTIAL/PLATELET
Abs Immature Granulocytes: 0.17 10*3/uL — ABNORMAL HIGH (ref 0.00–0.07)
Basophils Absolute: 0 10*3/uL (ref 0.0–0.1)
Basophils Relative: 0 %
Eosinophils Absolute: 0.4 10*3/uL (ref 0.0–0.5)
Eosinophils Relative: 5 %
HCT: 26.4 % — ABNORMAL LOW (ref 36.0–46.0)
Hemoglobin: 8.2 g/dL — ABNORMAL LOW (ref 12.0–15.0)
Immature Granulocytes: 2 %
Lymphocytes Relative: 32 %
Lymphs Abs: 2.7 10*3/uL (ref 0.7–4.0)
MCH: 27.2 pg (ref 26.0–34.0)
MCHC: 31.1 g/dL (ref 30.0–36.0)
MCV: 87.7 fL (ref 80.0–100.0)
Monocytes Absolute: 0.7 10*3/uL (ref 0.1–1.0)
Monocytes Relative: 8 %
Neutro Abs: 4.6 10*3/uL (ref 1.7–7.7)
Neutrophils Relative %: 53 %
Platelets: 26 10*3/uL — CL (ref 150–400)
RBC: 3.01 MIL/uL — ABNORMAL LOW (ref 3.87–5.11)
RDW: 17.8 % — ABNORMAL HIGH (ref 11.5–15.5)
WBC: 8.5 10*3/uL (ref 4.0–10.5)
nRBC: 0 % (ref 0.0–0.2)

## 2021-05-10 LAB — GLUCOSE, CAPILLARY
Glucose-Capillary: 72 mg/dL (ref 70–99)
Glucose-Capillary: 130 mg/dL — ABNORMAL HIGH (ref 70–99)
Glucose-Capillary: 170 mg/dL — ABNORMAL HIGH (ref 70–99)

## 2021-05-10 LAB — D-DIMER, QUANTITATIVE: D-Dimer, Quant: 1.04 ug/mL-FEU — ABNORMAL HIGH (ref 0.00–0.50)

## 2021-05-10 LAB — C-REACTIVE PROTEIN: CRP: 10.2 mg/dL — ABNORMAL HIGH (ref <1.0)

## 2021-05-10 LAB — ECHOCARDIOGRAM COMPLETE BUBBLE STUDY
Area-P 1/2: 4.39 cm2
S' Lateral: 3.2 cm
Single Plane A4C EF: 53 %

## 2021-05-10 LAB — LDL CHOLESTEROL, DIRECT: Direct LDL: 58.4 mg/dL (ref 0–99)

## 2021-05-10 LAB — FERRITIN: Ferritin: 1351 ng/mL — ABNORMAL HIGH (ref 11–307)

## 2021-05-10 LAB — BRAIN NATRIURETIC PEPTIDE: B Natriuretic Peptide: 3019.3 pg/mL — ABNORMAL HIGH (ref 0.0–100.0)

## 2021-05-10 LAB — MAGNESIUM: Magnesium: 1.8 mg/dL (ref 1.7–2.4)

## 2021-05-10 IMAGING — MR MR MRA HEAD W/O CM
1 series · 20 of 48 positions shown · non-contrast
Comparison: None.

CLINICAL DATA: Stroke follow-up

EXAM:
MRA NECK WITHOUT CONTRAST
MRA HEAD WITHOUT CONTRAST
TECHNIQUE: Multiplanar and multiecho pulse sequences of the neck were obtained
without intravenous contrast. Angiographic images of the neck were
obtained using MRA technique without intravenous contrast;
Angiographic images of the Circle of Willis were obtained using MRA
technique without intravenous contrast.

[Series 5: 3d cow · axial · 0.5mm · 0.41mm/px · z∈[-119,-39]mm · 20 of 172 slices shown]
[im 1/172]
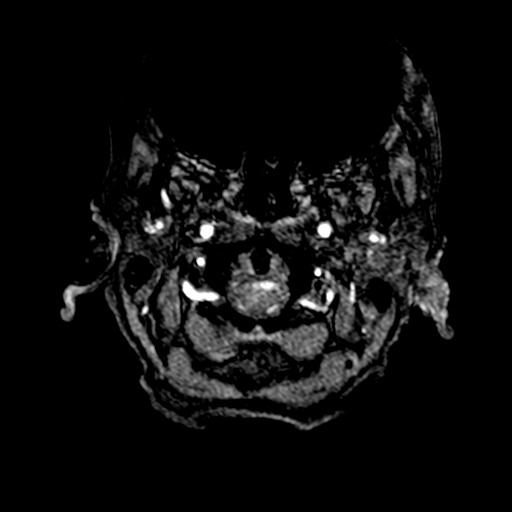
[im 4/172]
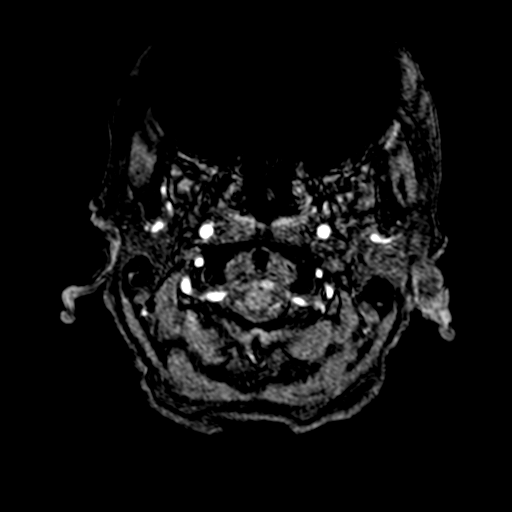
[im 8/172]
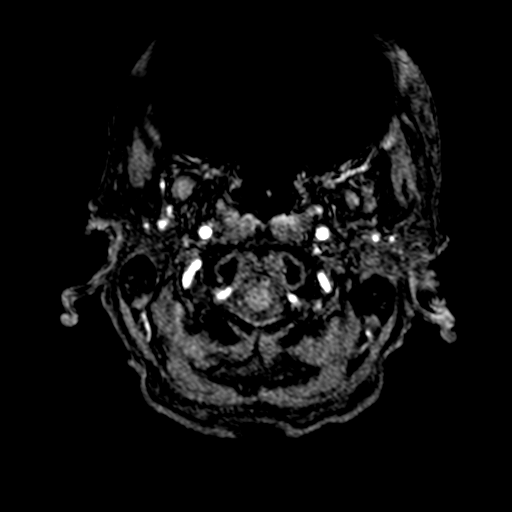
[im 11/172]
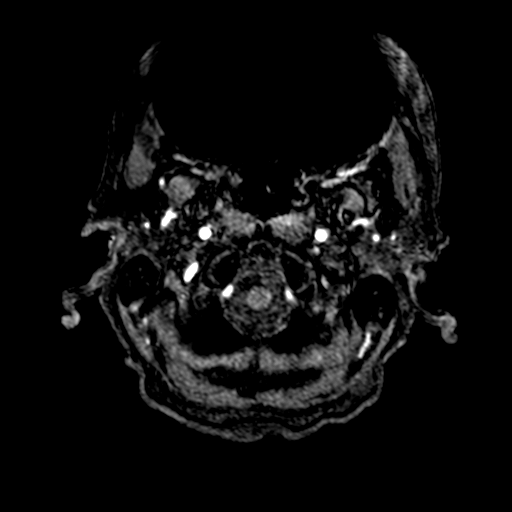
[im 15/172]
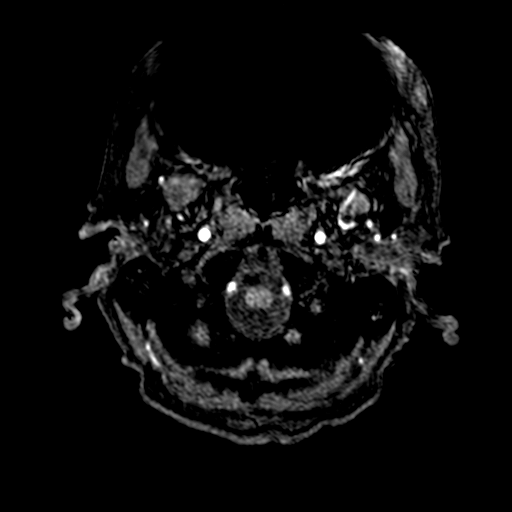
[im 19/172]
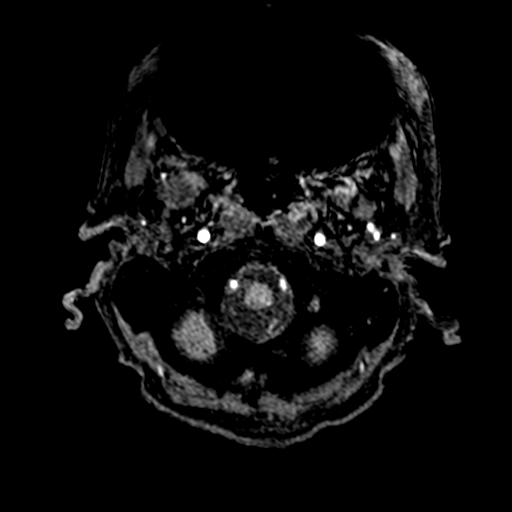
[im 22/172]
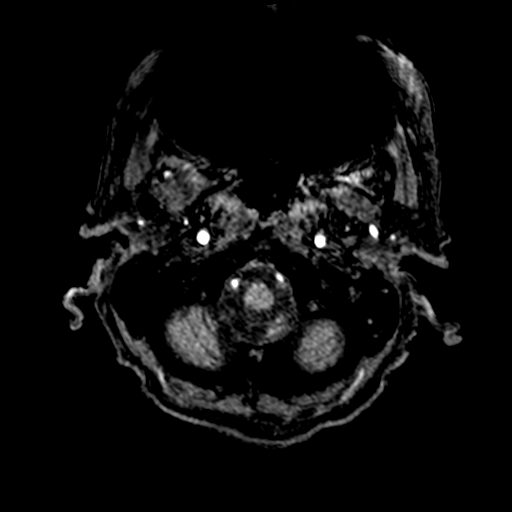
[im 26/172]
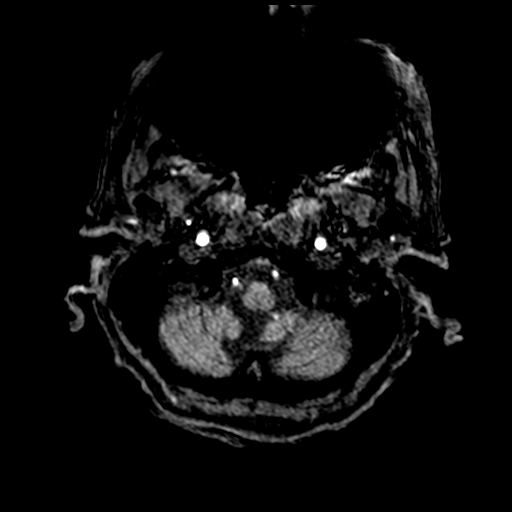
[im 30/172]
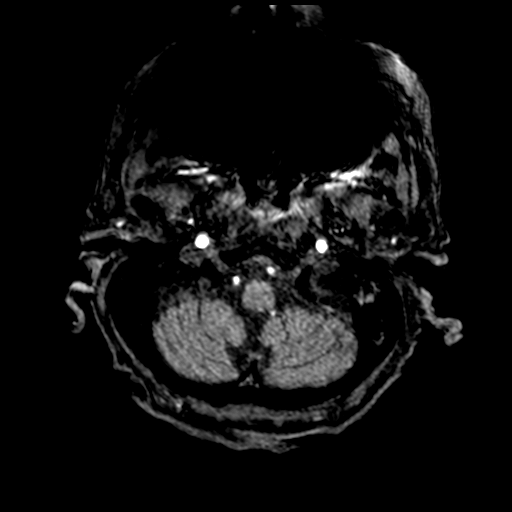
[im 33/172]
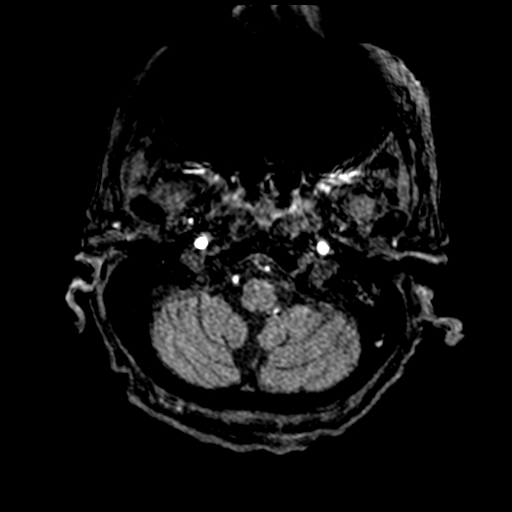
[im 37/172]
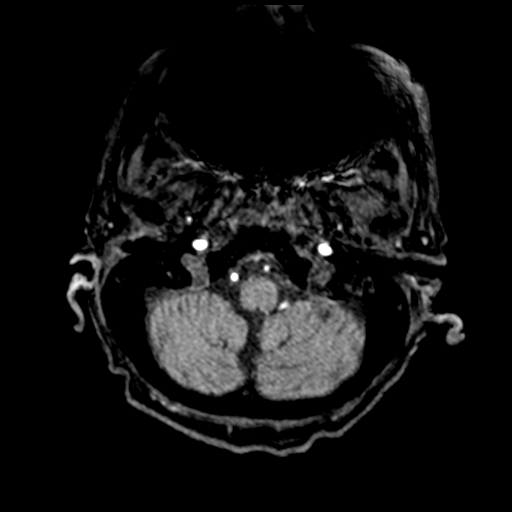
[im 41/172]
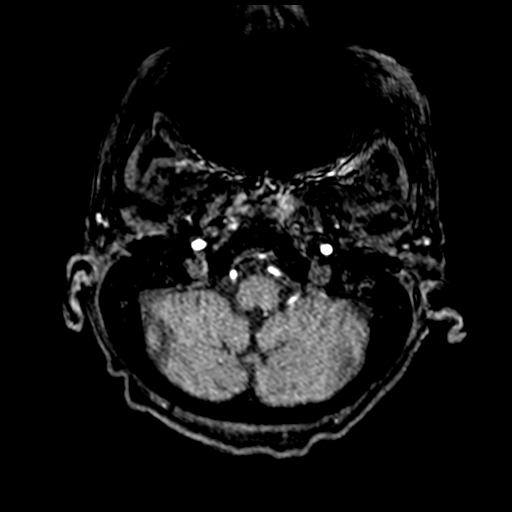
[im 55/172]
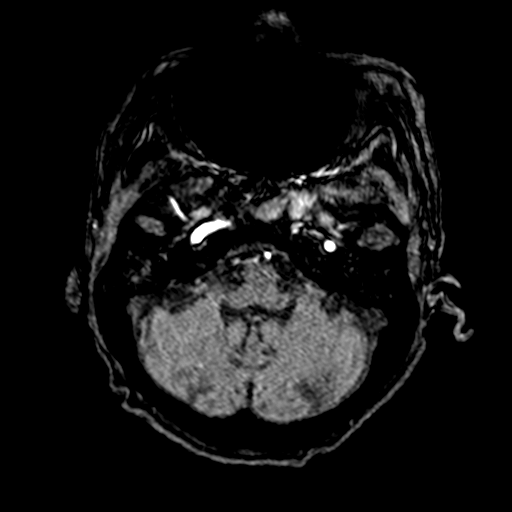
[im 77/172]
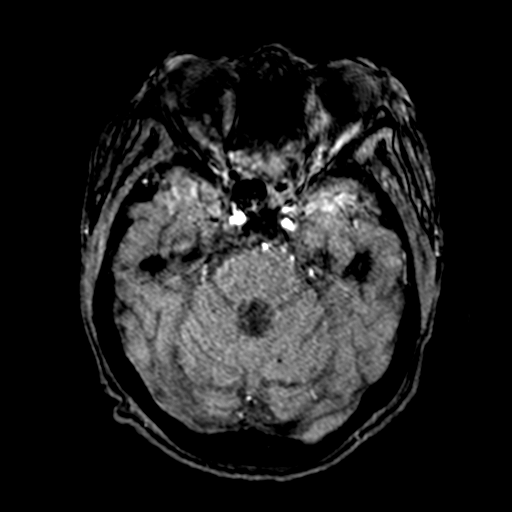
[im 88/172]
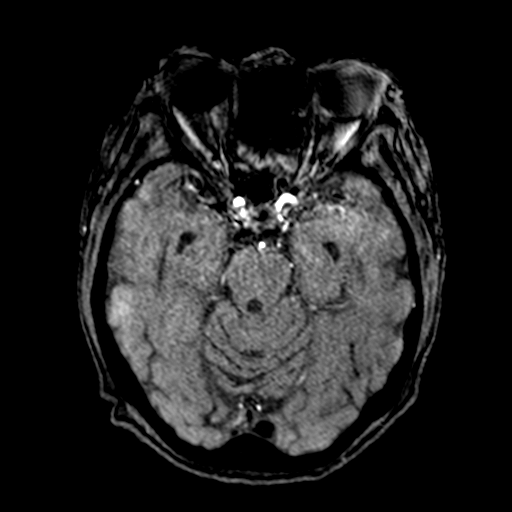
[im 99/172]
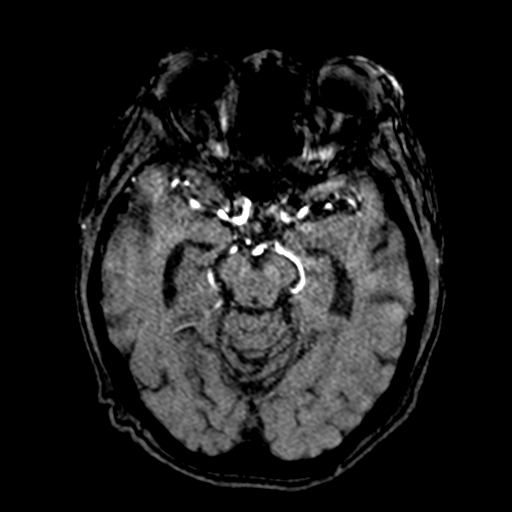
[im 121/172]
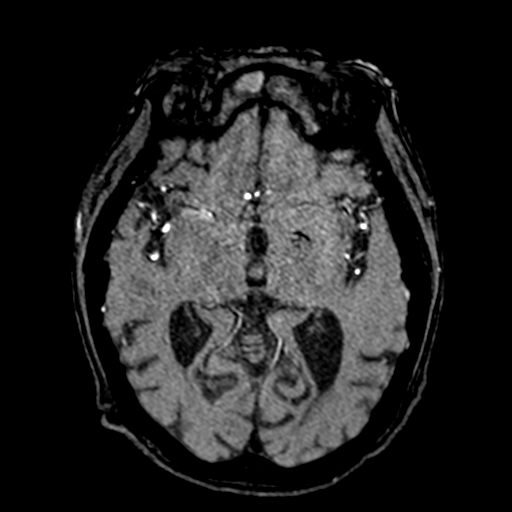
[im 142/172]
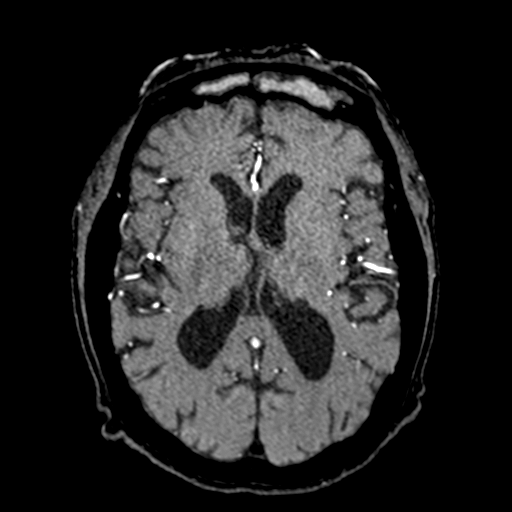
[im 146/172]
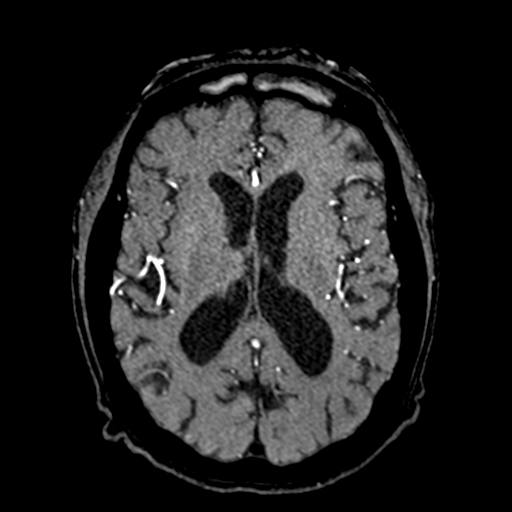
[im 164/172]
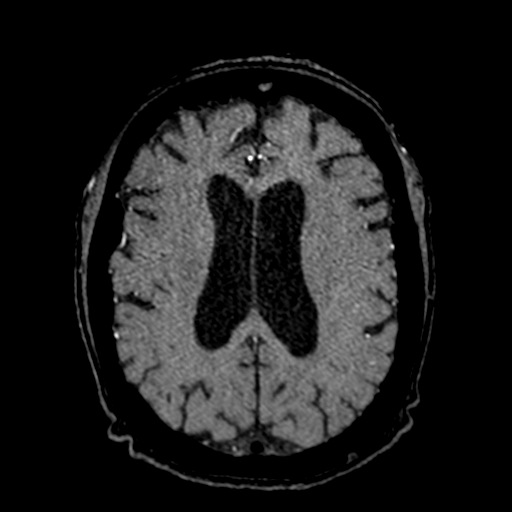

[20 of 48 positions shown; findings below may reference images not displayed]

FINDINGS: MRA NECK FINDINGS

Normal carotid and vertebral arteries.

MRA HEAD FINDINGS

POSTERIOR CIRCULATION:

--Vertebral arteries: Normal

--Inferior cerebellar arteries: Normal.

--Basilar artery: Normal.

--Superior cerebellar arteries: Normal.

--Posterior cerebral arteries: Narrowing of the right PCA distal P2
segment. Otherwise normal.

ANTERIOR CIRCULATION:

--Intracranial internal carotid arteries: Normal.

--Anterior cerebral arteries (ACA): Normal. Absent left A1 segment,
normal variant

--Middle cerebral arteries (MCA): Normal.

ANATOMIC VARIANTS: None
IMPRESSION: Normal MRA of the head and neck.

## 2021-05-10 IMAGING — MR MR MRA NECK W/O CM
2 series · 40 of 48 positions shown · non-contrast
Comparison: None.

CLINICAL DATA: Stroke follow-up

EXAM:
MRA NECK WITHOUT CONTRAST
MRA HEAD WITHOUT CONTRAST
TECHNIQUE: Multiplanar and multiecho pulse sequences of the neck were obtained
without intravenous contrast. Angiographic images of the neck were
obtained using MRA technique without intravenous contrast;
Angiographic images of the Circle of Willis were obtained using MRA
technique without intravenous contrast.

[Series 9: tof_fl3d_tra_iso · axial · 0.6mm · 0.52mm/px · z∈[-200,-125]mm · 14 of 133 slices shown (1 of 2)]
[im 1/133]
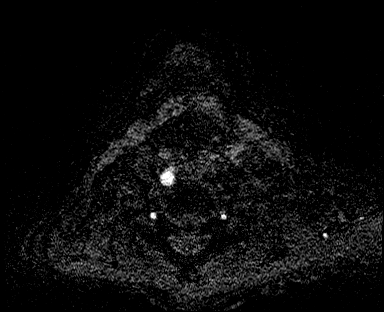
[im 11/133]
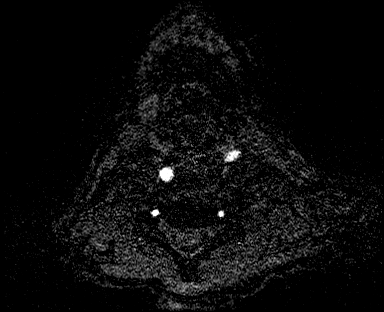
[im 21/133]
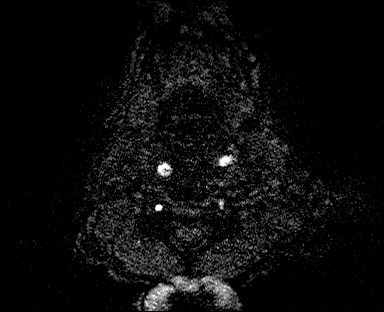
[im 31/133]
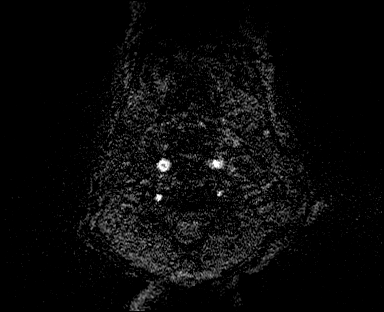
[im 41/133]
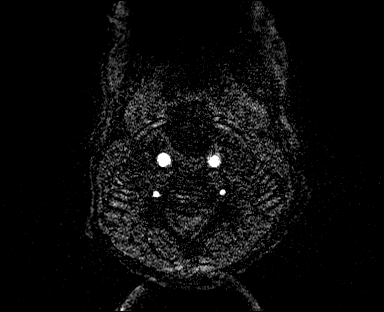
[im 51/133]
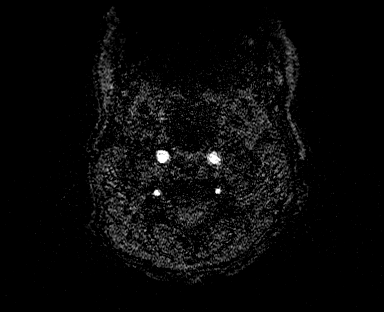
[im 61/133]
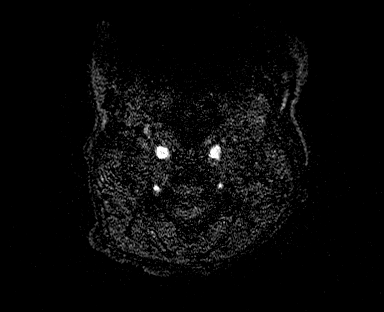
[im 72/133]
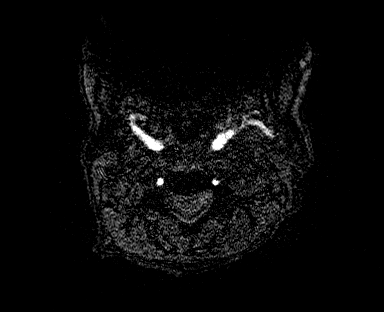
[im 82/133]
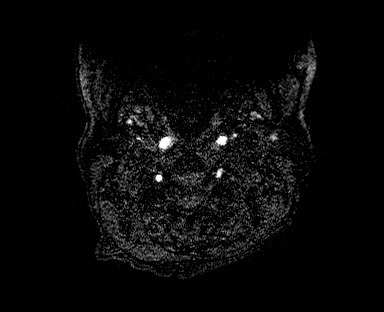
[im 92/133]
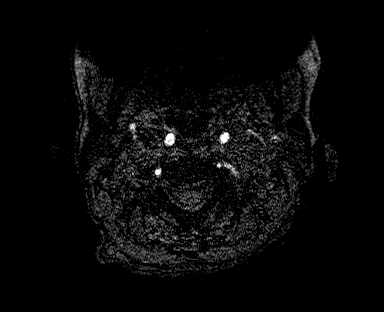
[im 102/133]
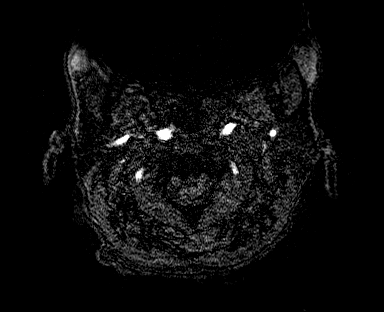
[im 112/133]
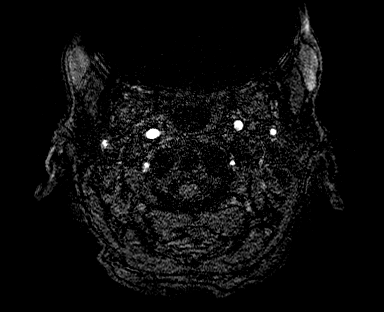
[im 122/133]
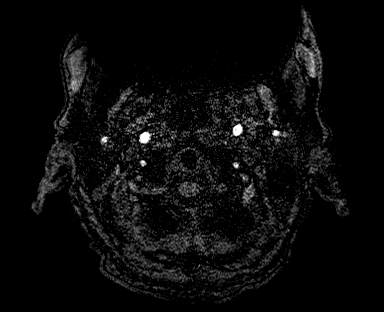
[im 133/133]
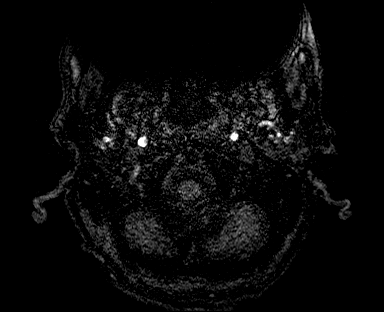

[Series 12: tof_fl3d_tra_iso · axial · 0.6mm · 0.52mm/px · z∈[-264,-98]mm · 26 of 318 slices shown (2 of 2)]
[im 1/318]
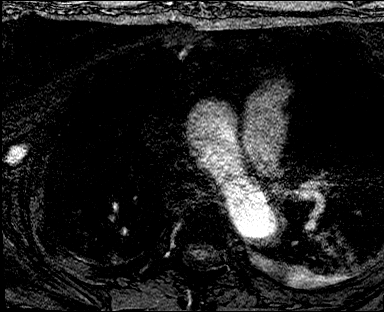
[im 10/318]
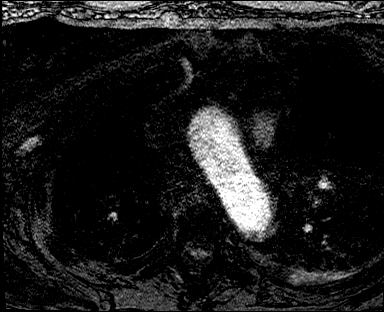
[im 20/318]
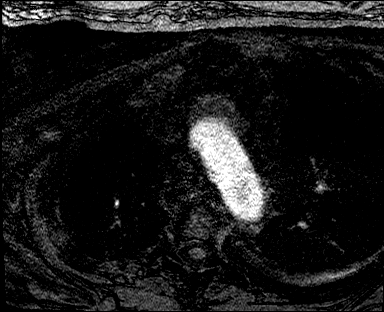
[im 29/318]
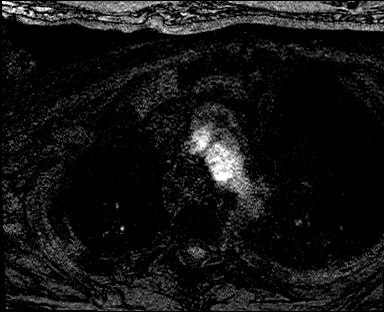
[im 39/318]
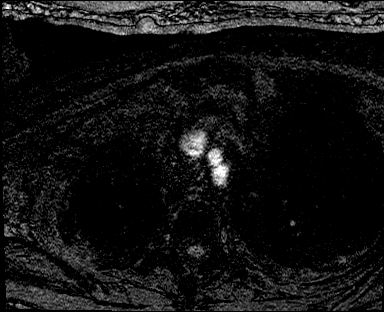
[im 49/318]
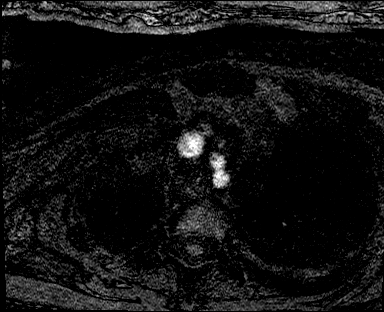
[im 58/318]
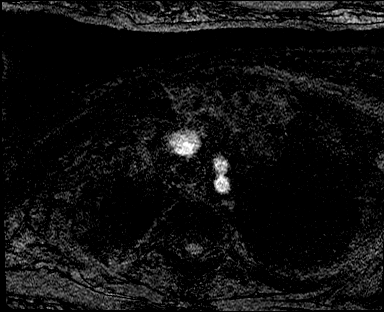
[im 68/318]
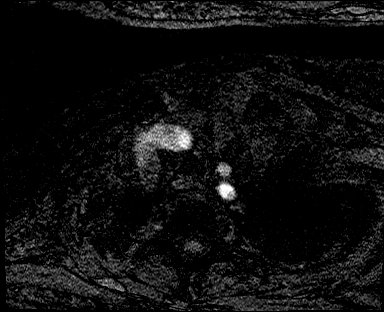
[im 77/318]
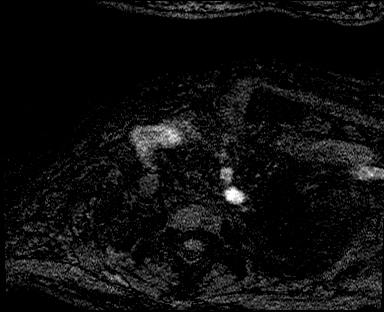
[im 87/318]
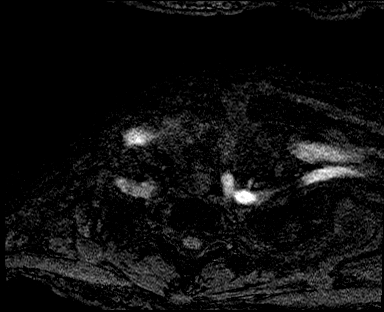
[im 97/318]
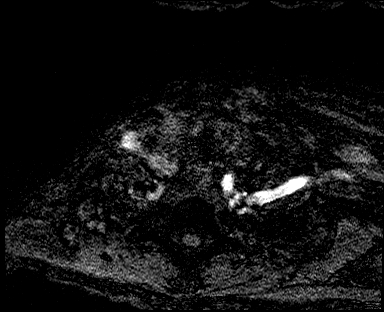
[im 106/318]
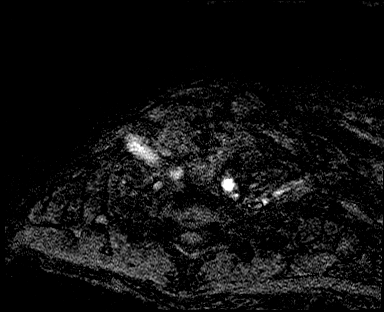
[im 116/318]
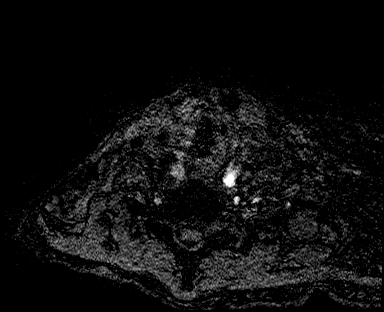
[im 125/318]
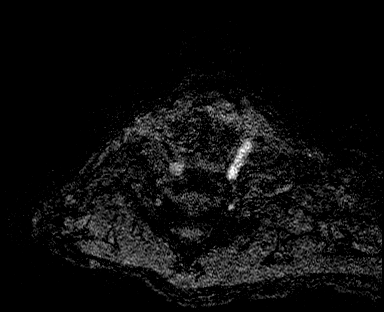
[im 135/318]
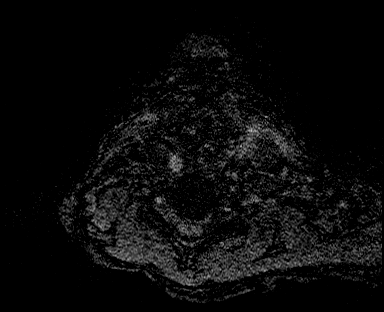
[im 145/318]
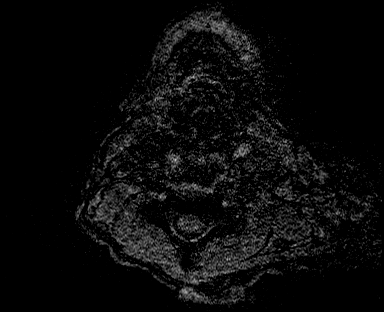
[im 154/318]
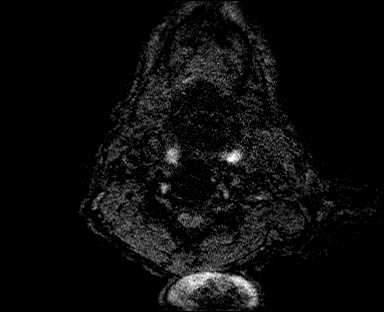
[im 164/318]
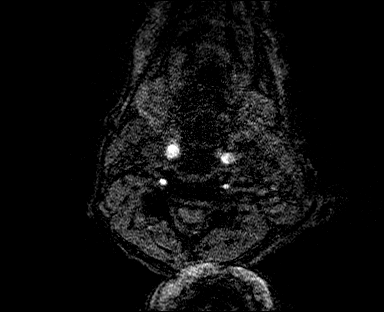
[im 173/318]
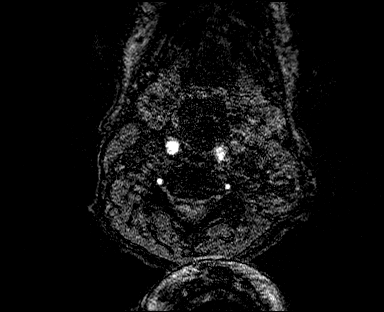
[im 183/318]
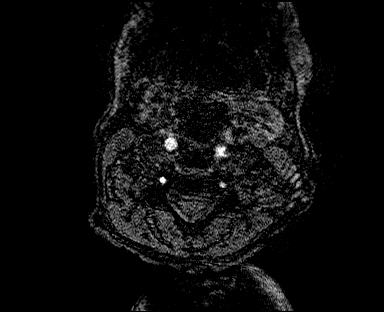
[im 193/318]
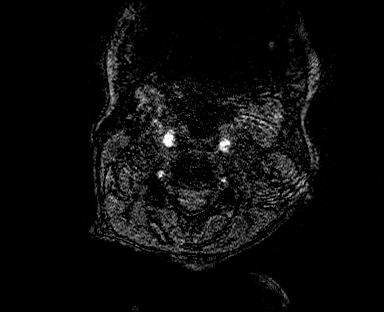
[im 202/318]
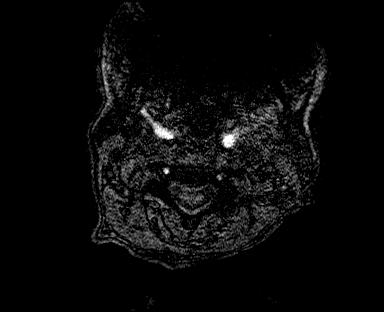
[im 221/318]
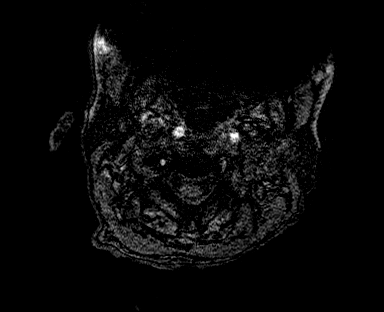
[im 260/318]
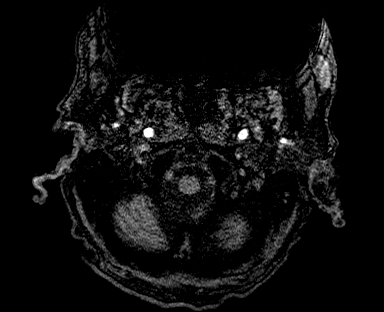
[im 269/318]
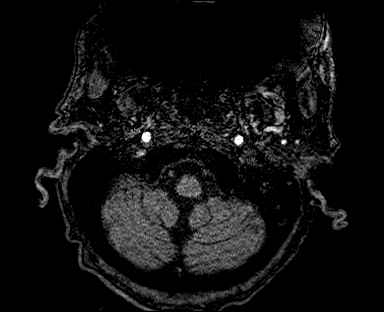
[im 298/318]
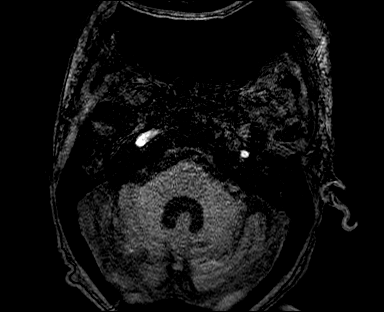

[40 of 48 positions shown; findings below may reference images not displayed]

FINDINGS: MRA NECK FINDINGS

Normal carotid and vertebral arteries.

MRA HEAD FINDINGS

POSTERIOR CIRCULATION:

--Vertebral arteries: Normal

--Inferior cerebellar arteries: Normal.

--Basilar artery: Normal.

--Superior cerebellar arteries: Normal.

--Posterior cerebral arteries: Narrowing of the right PCA distal P2
segment. Otherwise normal.

ANTERIOR CIRCULATION:

--Intracranial internal carotid arteries: Normal.

--Anterior cerebral arteries (ACA): Normal. Absent left A1 segment,
normal variant

--Middle cerebral arteries (MCA): Normal.

ANATOMIC VARIANTS: None
IMPRESSION: Normal MRA of the head and neck.

## 2021-05-10 MED ORDER — PENTAFLUOROPROP-TETRAFLUOROETH EX AERO: 1.0000 "application " | INHALATION_SPRAY | CUTANEOUS | Status: DC | PRN

## 2021-05-10 MED ORDER — DOXERCALCIFEROL 4 MCG/2ML IV SOLN
2.0000 ug | INTRAVENOUS | Status: DC
  Filled 2021-05-10 (×4): qty 2

## 2021-05-10 MED ORDER — CARVEDILOL 12.5 MG PO TABS
25.0000 mg | ORAL_TABLET | Freq: Two times a day (BID) | ORAL | Status: DC
  Administered 2021-05-10 – 2021-05-19 (×13): 25 mg via ORAL
  Filled 2021-05-10 (×15): qty 2

## 2021-05-10 MED ORDER — LOSARTAN POTASSIUM 50 MG PO TABS
100.0000 mg | ORAL_TABLET | Freq: Every day | ORAL | Status: DC
  Administered 2021-05-10 – 2021-05-19 (×9): 100 mg via ORAL
  Filled 2021-05-10 (×10): qty 2

## 2021-05-10 MED ORDER — HEPARIN SODIUM (PORCINE) 1000 UNIT/ML DIALYSIS: 1000.0000 [IU] | INTRAMUSCULAR | Status: DC | PRN

## 2021-05-10 MED ORDER — ATORVASTATIN CALCIUM 40 MG PO TABS
40.0000 mg | ORAL_TABLET | Freq: Every day | ORAL | Status: DC
  Administered 2021-05-11 – 2021-05-19 (×9): 40 mg via ORAL
  Filled 2021-05-10 (×9): qty 1

## 2021-05-10 MED ORDER — SODIUM CHLORIDE 0.9 % IV SOLN: 100.0000 mL | INTRAVENOUS | Status: DC | PRN

## 2021-05-10 MED ORDER — SODIUM CHLORIDE 0.9 % IV SOLN
100.0000 mg | Freq: Every day | INTRAVENOUS | Status: AC
  Administered 2021-05-11 – 2021-05-12 (×2): 100 mg via INTRAVENOUS
  Filled 2021-05-10 (×2): qty 20

## 2021-05-10 MED ORDER — LIDOCAINE HCL (PF) 1 % IJ SOLN: 5.0000 mL | INTRAMUSCULAR | Status: DC | PRN

## 2021-05-10 MED ORDER — HYDRALAZINE HCL 25 MG PO TABS
25.0000 mg | ORAL_TABLET | Freq: Three times a day (TID) | ORAL | Status: DC
  Administered 2021-05-10 – 2021-05-12 (×6): 25 mg via ORAL
  Filled 2021-05-10 (×6): qty 1

## 2021-05-10 MED ORDER — LABETALOL HCL 5 MG/ML IV SOLN
5.0000 mg | INTRAVENOUS | Status: DC | PRN
  Filled 2021-05-10: qty 4

## 2021-05-10 MED ORDER — LIDOCAINE-PRILOCAINE 2.5-2.5 % EX CREA: 1.0000 "application " | TOPICAL_CREAM | CUTANEOUS | Status: DC | PRN

## 2021-05-10 MED ORDER — PROSOURCE PLUS PO LIQD
30.0000 mL | Freq: Two times a day (BID) | ORAL | Status: DC
  Administered 2021-05-10 – 2021-05-19 (×8): 30 mL via ORAL
  Filled 2021-05-10 (×11): qty 30

## 2021-05-10 NOTE — Consult Note (Addendum)
[facility] Renal Consultation Note    Indication for Consultation:  Management of ESRD/hemodialysis, anemia, hypertension/volume, and secondary hyperparathyroidism. PCP:  HPI: Christine Gibson is a 70 y.o. female with ESRD, CLL, T2DM, HTN who was admitted with new CVA.  Presented to ED on 7/19 with R sided weakness, esp in legs, and dyspnea. Labs showed Na 142, K 3.9, Trop 17, WBC 7.6, Hgb 8.1. CXR with L pleural effusion. Head CT showed new "mass" in R cerebellum. Brain MRI later showed "Small acute left thalamocapsular infarct. Faint edema without mass effect, encephalomalacia in the right cerebellum with associated susceptibility artifact, likely the sequela of prior infarct with dystrophic calcification and/or prior hemorrhage. Smaller remote infarcts in the left cerebellum, severe pansinusitis." She was admitted for w/u of new CVA. Her COVID test was positive.  Nephrology consulted for dialysis, she was dialyzed overnight with 2L net UF and tolerated without issues per report.  Past Medical History:  Diagnosis Date   Chronic kidney disease    Diabetes mellitus without complication ([place])    History of leukemia    Hypertension    Past Surgical History:  Procedure Laterality Date   ABDOMINAL HYSTERECTOMY  1991   partial   CESAREAN SECTION     x 3   Family History  Problem Relation Age of Onset   Hyperlipidemia Mother    Diabetes Mother    Diabetes Father    Hyperlipidemia Father    Social History:  reports that she has never smoked. She has never used smokeless tobacco. She reports current alcohol use. She reports that she does not use drugs.  ROS: As per HPI otherwise negative.  Physical Exam: Vitals:   05/10/21 0830 05/10/21 0845 05/10/21 0900 05/10/21 0958  BP: (!) 147/75 (!) 163/73 (!) 161/60 (!) 152/84  Pulse: 75 72 71 77  Resp: 18 15 20 20   Temp:    99.5 F (37.5 C)  TempSrc:    Oral  SpO2: 96% 95% 96% 100%  Weight:      Height:       Exam  done at time of admission.   GEN: Hard of hearing, NAD. Nasal O2 in place. CV: RRR PULM: CTAB EXTREM: No LE edema ACCESS: LUE AVF + bruit with whistle  Allergies  Allergen Reactions   Lisinopril Cough   Prior to Admission medications   Medication Sig Start Date End Date Taking? Authorizing Provider  amLODipine (NORVASC) 10 MG tablet Take 1 tablet (10 mg total) by mouth daily. 07/19/15   Jearld Fenton, NP  atorvastatin (LIPITOR) 10 MG tablet Take 5 mg by mouth daily.    [provider]  carvedilol (COREG) 12.5 MG tablet Take 37.5 mg by mouth daily.    [provider]  CHLORAMBUCIL PO Take by mouth.    [provider]  Cyanocobalamin (VITAMIN B-12) 5000 MCG TBDP Take 5,000 mcg by mouth daily.    [provider]  ferrous sulfate 325 (65 FE) MG EC tablet Take 325 mg by mouth every Monday, Wednesday, and Friday.    [provider]  hydrALAZINE (APRESOLINE) 25 MG tablet Take 25 mg by mouth 3 (three) times daily.  02/08/17   [provider]  hydrocortisone cream 1 % Apply to affected area 2 times daily 12/26/20 12/26/21  [provider]  lidocaine (XYLOCAINE) 5 % ointment APPLY MODERATE AMOUNT TOPICALLY TWO TIMES A DAY AS NEEDED APPLY TO RECTAL AREA FOR PAIN 06/01/20   [provider]  losartan (COZAAR)  50 MG tablet Take 50 mg by mouth daily.     [provider]  meclizine (ANTIVERT) 25 MG tablet Take 1 tablet (25 mg total) by mouth 3 (three) times daily as needed for dizziness. 01/12/19   Mosetta Anis, MD  NIFEdipine (ADALAT CC) 60 MG 24 hr tablet Take 60 mg by mouth daily.    [provider]  Zanubrutinib (BRUKINSA) 80 MG CAPS Take 2 capsules by mouth daily.    [provider]   Current Facility-Administered Medications  Medication Dose Route Frequency Provider Last Rate Last Admin   ascorbic acid (VITAMIN C) tablet 500 mg  500 mg Oral Daily Hall, Carole N, DO       atorvastatin (LIPITOR) tablet 80  mg  80 mg Oral Daily Derek Jack, MD       guaiFENesin-dextromethorphan [facility] DM) 100-10 MG/5ML syrup 10 mL  10 mL Oral Q4H PRN Irene Pap N, DO       insulin aspart (novoLOG) injection 0-5 Units  0-5 Units Subcutaneous QHS Hall, Carole N, DO       insulin aspart (novoLOG) injection 0-9 Units  0-9 Units Subcutaneous TID WC Irene Pap N, DO   2 Units at 05/10/21 8127   Ipratropium-Albuterol (COMBIVENT) respimat 1 puff  1 puff Inhalation Q6H Irene Pap N, DO   1 puff at 05/10/21 0904   labetalol (NORMODYNE) injection 5 mg  5 mg Intravenous Q2H PRN Irene Pap N, DO       methylPREDNISolone sodium succinate (SOLU-MEDROL) 40 mg/mL injection 25 mg  25 mg Intravenous Q12H Hall, Carole N, DO   25 mg at 05/09/21 2352   Followed by   Derrill Memo ON 05/13/2021] predniSONE (DELTASONE) tablet 50 mg  50 mg Oral Daily Irene Pap N, DO       multivitamin with minerals tablet 1 tablet  1 tablet Oral Daily Irene Pap N, DO       [START ON 05/11/2021] remdesivir 100 mg in sodium chloride 0.9 % 100 mL IVPB  100 mg Intravenous Daily Hall, Carole N, DO       Zanubrutinib CAPS 2 capsule  2 capsule Oral Daily Rainbow, Carole N, DO       zinc sulfate capsule 220 mg  220 mg Oral Daily Kayleen Memos, [place]       Labs: Basic Metabolic Panel: Recent Labs  Lab 05/09/21 1138 05/10/21 0205  NA 142 141  K 3.9 4.5  CL 105 105  CO2 27 23  GLUCOSE 160* 173*  BUN 45* 52*  CREATININE 6.21* 6.66*  CALCIUM 8.6* 8.3*  PHOS  --  5.5*   Liver Function Tests: Recent Labs  Lab 05/10/21 0205  ALBUMIN 2.6*   CBC: Recent Labs  Lab 05/09/21 1138 05/09/21 1309 05/09/21 1956 05/10/21 0205  WBC 7.6 7.8 8.5 8.5  NEUTROABS  --  3.9  --  4.6  HGB 8.1* 8.4* 8.6* 8.2*  HCT 25.9* 27.1* 27.0* 26.4*  MCV 87.8 87.4 87.4 87.7  PLT 24* 26* 25* 26*   Studies/Results: CT Head Wo Contrast  Result Date: 05/09/2021 CLINICAL DATA:  Neuro deficit, acute stroke suspected. Altered mental status. EXAM: CT HEAD WITHOUT  CONTRAST TECHNIQUE: Contiguous axial images were obtained from the base of the skull through the vertex without intravenous contrast. COMPARISON:  CT head January 12, 2019. FINDINGS: Brain: New amorphous 1.6 x 1.6 x 1.1 cm area of hyperdensity within the right cerebellar hemisphere. The surrounding hypodensity appears to be encephalomalacia rather than  edema. Also, there is no significant mass effect. No evidence acute large vascular territory infarct. No midline shift. Basal cisterns are patent. Generalized atrophy with ex vacuo ventricular dilation, progressed from the prior. No hydrocephalus. No extra-axial fluid collection. Small remote left cerebellar lacunar infarct with suspected additional small area of dystrophic calcification in the inferior left cerebellum. Mild patchy white matter hypoattenuation, likely the sequela of chronic microvascular ischemic disease. Vascular: No hyperdense vessel identified. Calcific intracranial atherosclerosis. Skull: No acute fracture. Sinuses/Orbits: Severe pansinus disease with complete opacification of bilateral frontal sinuses, near complete opacification of the ethmoid air cells and left sphenoid sinus and moderate mucosal thickening of the remaining sinuses. Other: Trace bilateral mastoid fluid with right greater than left middle ear fluid. IMPRESSION: 1. New 1.6 cm amorphous area of hyperdensity within the right cerebellar hemisphere, which has an appearance suggestive of an area of dystrophic calcification from prior insult. The appearance is atypical for acute hemorrhage, particularly given suggestion of surrounding encephalomalacia and no significant mass effect. An MRI with contrast is recommended to further characterize and to exclude a mass. 2. Small remote left cerebellar lacunar infarct with suspected additional small area of calcification in the inferior left cerebellum. 3. No evidence of acute large vascular territory infarct. 4. Moderate atrophy and mild  chronic microvascular ischemic disease. 5. Severe pansinusitis. 6. Trace bilateral mastoid fluid with right greater than left middle ear fluid. Findings and recommendations discussed with Dr. Regenia Skeeter via telephone at 1:30 p.m. Electronically Signed   By: Margaretha Sheffield MD   On: 05/09/2021 14:00   MR ANGIO HEAD WO CONTRAST  Result Date: 05/10/2021 CLINICAL DATA:  Stroke follow-up EXAM: MRA NECK WITHOUT CONTRAST MRA HEAD WITHOUT CONTRAST TECHNIQUE: Multiplanar and multiecho pulse sequences of the neck were obtained without intravenous contrast. Angiographic images of the neck were obtained using MRA technique without intravenous contrast; Angiographic images of the Circle of Willis were obtained using MRA technique without intravenous contrast. COMPARISON:  None. FINDINGS: MRA NECK FINDINGS Normal carotid and vertebral arteries. MRA HEAD FINDINGS POSTERIOR CIRCULATION: --Vertebral arteries: Normal --Inferior cerebellar arteries: Normal. --Basilar artery: Normal. --Superior cerebellar arteries: Normal. --Posterior cerebral arteries: Narrowing of the right PCA distal P2 segment. Otherwise normal. ANTERIOR CIRCULATION: --Intracranial internal carotid arteries: Normal. --Anterior cerebral arteries (ACA): Normal. Absent left A1 segment, normal variant --Middle cerebral arteries (MCA): Normal. ANATOMIC VARIANTS: None IMPRESSION: Normal MRA of the head and neck. Electronically Signed   By: Ulyses Jarred M.D.   On: 05/10/2021 03:06   MR ANGIO NECK WO CONTRAST  Result Date: 05/10/2021 CLINICAL DATA:  Stroke follow-up EXAM: MRA NECK WITHOUT CONTRAST MRA HEAD WITHOUT CONTRAST TECHNIQUE: Multiplanar and multiecho pulse sequences of the neck were obtained without intravenous contrast. Angiographic images of the neck were obtained using MRA technique without intravenous contrast; Angiographic images of the Circle of Willis were obtained using MRA technique without intravenous contrast. COMPARISON:  None. FINDINGS: MRA  NECK FINDINGS Normal carotid and vertebral arteries. MRA HEAD FINDINGS POSTERIOR CIRCULATION: --Vertebral arteries: Normal --Inferior cerebellar arteries: Normal. --Basilar artery: Normal. --Superior cerebellar arteries: Normal. --Posterior cerebral arteries: Narrowing of the right PCA distal P2 segment. Otherwise normal. ANTERIOR CIRCULATION: --Intracranial internal carotid arteries: Normal. --Anterior cerebral arteries (ACA): Normal. Absent left A1 segment, normal variant --Middle cerebral arteries (MCA): Normal. ANATOMIC VARIANTS: None IMPRESSION: Normal MRA of the head and neck. Electronically Signed   By: Ulyses Jarred M.D.   On: 05/10/2021 03:06   MR BRAIN WO CONTRAST  Result Date: 05/09/2021 EXAM: MRI HEAD WITHOUT  CONTRAST TECHNIQUE: Multiplanar, multiecho pulse sequences of the brain and surrounding structures were obtained without intravenous contrast. COMPARISON:  Same day CT head. FINDINGS: Brain: Small acute left thalamocapsular infarct. Encephalomalacia in the right cerebellum with associated susceptibility artifact. Smaller remote infarcts in the left cerebellum with some areas of susceptibility artifact. Moderate scattered T2/FLAIR hyperintensities in the supratentorial and infratentorial white matter, nonspecific but most likely related to chronic microvascular ischemic disease. Moderate atrophy with ex vacuo ventricular dilation. No hydrocephalus. No evidence of acute hemorrhage. No extra-axial fluid collections. No evidence of a mass lesion. Vascular: Major arterial flow voids are maintained at the skull base. Skull and upper cervical spine: Normal marrow signal. Sinuses/Orbits: Severe pansinusitis. Other: Small bilateral mastoid effusions with bilateral middle ear fluid. IMPRESSION: 1. Small acute left thalamocapsular infarct. Faint edema without mass effect. 2. Encephalomalacia in the right cerebellum with associated susceptibility artifact, likely the sequela of prior infarct with dystrophic  calcification and/or prior hemorrhage. Smaller remote infarcts in the left cerebellum. 3. Moderate chronic microvascular ischemic disease 4. Severe pansinusitis. 5. Small bilateral mastoid effusions with bilateral middle ear fluid. Electronically Signed   By: Margaretha Sheffield MD   On: 05/09/2021 17:59   DG Chest Portable 1 View  Result Date: 05/09/2021 CLINICAL DATA:  Shortness of breath, generalized weakness, unable to talk in complete sentences, end-stage renal disease on dialysis, missed dialysis today due to weakness EXAM: PORTABLE CHEST 1 VIEW COMPARISON:  Portable exam 1123 hours compared to 01/12/2019 FINDINGS: Enlargement of cardiac silhouette with pulmonary vascular congestion. Atherosclerotic calcification aorta. Mild interstitial infiltrates consistent with pulmonary edema and CHF versus fluid overload. LEFT pleural effusion and basilar atelectasis present. No pneumothorax or acute osseous findings. IMPRESSION: Enlargement of cardiac silhouette with pulmonary vascular congestion and mild diffuse pulmonary edema. LEFT pleural effusion and LEFT basilar atelectasis. Aortic Atherosclerosis (ICD10-I70.0). Electronically Signed   By: Lavonia Dana M.D.   On: 05/09/2021 11:53    Dialysis Orders:  TTS [place]  3:30, 450/500, EDW 44.5kg, 3K/2.5Ca, AVG, heparin 3000 - Hectoral 61mcg IV q HD - Mircera 158mcg IV q 2 weeks (last 7/9)  Assessment/Plan:  New acute L thalamocapsular infarct: Work-up per neuro.  COVID positive: with new hypoxia and CXR infiltrate - started on IV solu-medrol and remdesivir. Will need isolation HD per clinic policies.  ESRD:  Continue HD per TTS schedule -> next tomorrow 7/21.  Hypertension/volume: BP slightly high - permissive HTN per neuro note.  Anemia: Hgb 8.2 - not due for ESA yet.  Metabolic bone disease: Ca/Phos ok, continue home binders and VDRA.  Nutrition:  Alb low, will add supplements.  CLL/thrombocytopenia  T2DM  Veneta Penton, PA-C 05/10/2021, 11:55 AM   [facility]  I have seen and examined this patient and agree with plan and assessment in the above note with renal recommendations/intervention highlighted. Plan to keep on outpatient schedule for HD, will need to be done separately due to covid+. [place] John A Marino Rogerson,MD 05/10/2021 1:55 PM

## 2021-05-10 NOTE — Progress Notes (Addendum)
STROKE TEAM PROGRESS NOTE    Interval History   No acute events overnight, patient resting in bed. Updated about her stroke, stroke work up and stroke risk factors.   No family at bedside due to covid precautions.    Pertinent Lab Work and Imaging    05/09/21 CT Head WO IV Contrast 1. New 1.6 cm amorphous area of hyperdensity within the right cerebellar hemisphere, which has an appearance suggestive of an area of dystrophic calcification from prior insult. The appearance is atypical for acute hemorrhage, particularly given suggestion of surrounding encephalomalacia and no significant mass effect. An MRI with contrast is recommended to further characterize and to exclude a mass. 2. Small remote left cerebellar lacunar infarct with suspected additional small area of calcification in the inferior left cerebellum. 3. No evidence of acute large vascular territory infarct. 4. Moderate atrophy and mild chronic microvascular ischemic disease. 5. Severe pansinusitis. 6. Trace bilateral mastoid fluid with right greater than left middle ear fluid.  05/09/21 MRI Brain WO IV Contrast 1. Small acute left thalamocapsular infarct. Faint edema without mass effect. 2. Encephalomalacia in the right cerebellum with associated susceptibility artifact, likely the sequela of prior infarct with dystrophic calcification and/or prior hemorrhage. Smaller remote infarcts in the left cerebellum. 3. Moderate chronic microvascular ischemic disease 4. Severe pansinusitis. 5. Small bilateral mastoid effusions with bilateral middle ear fluid.  05/10/21 MRA Head and Neck  IMPRESSION: Normal MRA of the head and neck.  05/10/21 Echocardiogram Complete  Ejection fraction of 50% with global hypokinesis.  No definite clot.  Left atrial size is mildly dilated.  Physical Examination   Constitutional: Calm, frail elderly African-American lady not in distress  Cardiovascular: Normal RR Respiratory: Slightly increased WOB    Mental status: Oriented to person, hospital, situation but not to time, follows commands  Speech: Fluent with repetition and naming intact  Cranial nerves: EOMI, VFF, Face symmetric, Tongue midline, shoulder shrug intact  Motor: Normal bulk and tone. Right pronator drift. Strength exam is limited by pain- 4+/5 RUE, +4/5 RLE 5/5 LUE and LLE  Sensory: Decreased sensation to RUE and RLE  Coordination: Intact FNF & HTS  Gait: Deferred due to RLE weakness   Assessment and Plan   [name] is a 70 y.o. female w/pmh of CKD, HTN, DM, CLL currently active c/b anemia and thrombocytopenia on zanubrutinib, prior cerebellar stroke per patient p/w R sided weakness, LH, difficulty ambulating x36 hrs. MRI brain showed small acute L thalamocapsular infarct without mass effect. Was found to be covid +. She was outside the window for IVTPA and was not eligible for thrombectomy due to lack of cortical signs indicating LVO.   #L Thalamocapsular Stroke  Patient presented with the symptoms described above. At this time, stroke work up is essentially complete pending echo findings. MRI Brain showed an acute left thalamocapsular stroke. MRA Head and Neck was obtained instead of CTA due to elevated Cr in the setting of CKD- no significant stenosis noted. Echo results are pending. Stroke labs w/LDL 67, hemoglobin a1C 6.0. Stroke etiology is SVD given known HTN and DMII.  - Will not treat with Aspirin or DAPT given severe thrombocytopenia.  Platelet count 26,000 and only oncologist at [facility] took her off her ASA in the past.   -Continue Atorvastatin 40 mg for stroke prevention given she was on this at home and it is effectively controlling her LDL which is at goal < 70 at 67 -Referral to neurology placed for stroke follow up   #  Hypertension She has a history of HTN and takes Furosemide 80 mg QD, Losartan 100 mg QD, Coreg 25 mg BID, Nifedipine 90 mg Q24 hour QD and Hydralazine 25 mg TID at home. Currently blood  pressure is trending in the 150 to 170 range. Given her sx started 36 hours prior to admission there is no need for permissive hypertension at this time.  - Primary team can gradually lower blood pressure being careful to avoid acute drops  - Long term blood pressure goal is normotensive   #Stroke Swallow Screening  Passed bedside swallow eval, on a renal diet   #DMII  Hemoglobin A1C this admission noted to be 6.0, at goal from a stroke standpoint.   Neurology will sign off, please reach out via Amion with any questions.   Hospital day # 1  Ruta Hinds, NP  [facility] Nurse Practitioner Patient seen and discussed with attending physician Dr. Leonie Man   Stroke MD note : I have personally obtained history,examined this patient, reviewed notes, independently viewed imaging studies, participated in medical decision making and plan of care.ROS completed by me personally and pertinent positives fully documented  I have made any additions or clarifications directly to the above note. Agree with note above.  Christine Gibson patient presented with right-sided weakness due to small left thalamic subcortical infarct likely from small vessel disease in the setting of acute COVID illness.  She has mild residual right-sided weakness and numbness which I expect to improve over time she has significant thrombocytopenia and hence is not a candidate for antiplatelet therapy.  Continue ongoing stroke work-up and aggressive risk factor modification.  Treatment of COVID illness as per primary team.  Physical Occupational Therapy consults.  Follow-up as an outpatient in the stroke clinic in 6 to 8 weeks.  Long discussion with patient and answered questions.  Discussed with Dr. Lilyan Gilford.  Greater than 50% time during this 35-minute visit was spent on counseling and coordination of care and answering question.  Stroke team will sign off  Antony Contras, MD Medical Director [facility] Pager:  [phone] 05/10/2021 4:44 PM   To contact Stroke Continuity provider, please refer to [email or web address] After hours, contact General Neurology

## 2021-05-10 NOTE — Progress Notes (Signed)
  Echocardiogram 2D Echocardiogram has been performed.  Matilde Bash 05/10/2021, 12:01 PM

## 2021-05-10 NOTE — TOC CAGE-AID Note (Signed)
Transition of Care [facility]) - CAGE-AID Screening   Patient Details  Name: Christine Gibson MRN: [id number] Date of Birth: 19-Oct-1951  [facility] CM/SW Contact:    Stanislaw Acton C Tarpley-Carter, [place] Phone Number: 05/10/2021, 1:53 PM   Clinical Narrative: Pt participated in [place].  Pt stated she does not use substance or ETOH.  Pt was not offered resources.  Pt stated they did not feel that they were in need of resources at this time.  [name], MSW, LCSW-A Pronouns:  She/Her/Hers [facility] Clinical Social Worker Direct Number:  [phone] [name]    CAGE-AID Screening:    Have You Ever Felt You Ought to Cut Down on Your Drinking or Drug Use?: No Have People Annoyed You By [employer] Your Drinking Or Drug Use?: No Have You Felt Bad Or Guilty About Your Drinking Or Drug Use?: No Have You Ever Had a Drink or Used Drugs First Thing In The Morning to Steady Your Nerves or to Get Rid of a Hangover?: No CAGE-AID Score: 0  Substance Abuse Education Offered: No

## 2021-05-10 NOTE — Progress Notes (Signed)
PROGRESS NOTE    Christine Gibson  KGU:[id number] DOB: Nov 12, 1950 DOA: 05/09/2021 PCP: Jearld Fenton, NP   Brief Narrative:  HPI: Christine Gibson is a 69 y.o. 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  History is obtained from the patient and her husband via phone, poor historians.  CT head was abnormal, revealing new 1.6 cm amorphous area of hyperdensity within the right cerebellar hemisphere.  MRI brain revealed small acute left thalamocapsular infarct.  Faint edema without mass-effect.  Seen by neurology who recommended hospitalist admission for stroke work-up.   ED Course: T-max 98.6.  BP 142/79, pulse 83, respirations 16, O2 saturation 100% on 3 L.  Assessment & Plan:   Active Problems:   Acute CVA (cerebrovascular accident) ([place])  Acute CVA, POA: MRI brain showed small acute left thalamocapsular infarct without mass-effect. MRA head and neck negative for CVO.  Echo pending.  PT OT and SLP consulted.  Neurology consulted.  To be seen by neurology.  Blood pressure significantly elevated.  Out of window for permissive hypertension.  Will resume hydralazine, Coreg and losartan.  ESRD on HD TTS: Nephrology consulted.   Acute hypoxic respiratory failure secondary to acute CHF, type unknown: Elevated BNP greater than 3000.  Chest x-ray also shows pulmonary edema.  Patient is requiring 2 L of oxygen.  We will try to wean oxygen.  Continue dialysis and hopefully that will help treat CHF.   COVID-19 viral infection Self-reported positive COVID-19 screening test 03/15/2021 at outside facility Positive COVID test on 05/09/2021.  I personally do not see any infiltrates on the chest x-rayHowever she has elevated inflammatory markers.  She has been started on remdesivir and steroids.  We will reduce duration of remdesivir to 3  doses only instead of 5.   Hyperlipidemia: Continue Lipitor.   CLL/chronic thrombocytopenia: Platelets further down than her baseline, down to 26 today.  Per neurology, not a candidate for any antiplatelet.  Resume home medications.   Anemia of chronic disease: Hemoglobin at baseline.  Monitor.  DVT prophylaxis: SCDs Start: 05/09/21 1935   Code Status: Full Code  Family Communication: None present at bedside.  Plan of care discussed with patient in length and he verbalized understanding and agreed with it.  Status is: Inpatient  Remains inpatient appropriate because:Ongoing diagnostic testing needed not appropriate for outpatient work up  Dispo:  Patient From: Home  Planned Disposition: To be determined  Medically stable for discharge: No          Estimated body mass index is 21.48 kg/m as calculated from the following:   Height as of this encounter: 5' (1.524 m).   Weight as of this encounter: 49.9 kg.      Nutritional status:               Consultants:  Neurology  Procedures:  NONE  Antimicrobials:  Anti-infectives (From admission, onward)    Start     Dose/Rate Route Frequency Ordered Stop   05/11/21 1000  remdesivir 100 mg in sodium chloride 0.9 % 100 mL IVPB  Status:  Discontinued       See Hyperspace for full Linked Orders Report.   100 mg 200 mL/hr over 30 Minutes Intravenous Daily 05/09/21 2326 05/10/21 1611   05/11/21 1000  remdesivir 100 mg in sodium chloride 0.9 % 100 mL IVPB  See Hyperspace for full Linked Orders Report.   100 mg 200 mL/hr over 30 Minutes Intravenous Daily 05/10/21 1611 05/13/21 0959   05/10/21 0030  remdesivir 200 mg in sodium chloride 0.9% 250 mL IVPB       See Hyperspace for full Linked Orders Report.   200 mg 580 mL/hr over 30 Minutes Intravenous Once 05/09/21 2326 05/10/21 0105          Subjective: Seen and examined this morning.  Despite of being on oxygen, she denied any shortness of breath.  She  still felt that she still had some weakness in the right lower extremity but that was improving.  No other complaint.  Objective: Vitals:   05/10/21 0845 05/10/21 0900 05/10/21 0958 05/10/21 1249  BP: (!) 163/73 (!) 161/60 (!) 152/84 (!) 178/69  Pulse: 72 71 77 81  Resp: 15 20 20 20   Temp:   99.5 F (37.5 C) 97.6 F (36.4 C)  TempSrc:   Oral Oral  SpO2: 95% 96% 100% 100%  Weight:      Height:        Intake/Output Summary (Last 24 hours) at 05/10/2021 1611 Last data filed at 05/10/2021 7353 Gross per 24 hour  Intake --  Output 2000 ml  Net -2000 ml   Filed Weights   05/09/21 1106  Weight: 49.9 kg    Examination:  General exam: Appears calm and comfortable  Respiratory system: Crackles at the bases bilaterally. Respiratory effort normal. Cardiovascular system: S1 & S2 heard, RRR. No JVD, murmurs, rubs, gallops or clicks. No pedal edema. Gastrointestinal system: Abdomen is nondistended, soft and nontender. No organomegaly or masses felt. Normal bowel sounds heard. Central nervous system: Alert and oriented. No focal neurological deficits. Extremities: Symmetric 5 x 5 power. Skin: No rashes, lesions or ulcers    Data Reviewed: I have personally reviewed following labs and imaging studies  CBC: Recent Labs  Lab 05/09/21 1138 05/09/21 1309 05/09/21 1956 05/10/21 0205  WBC 7.6 7.8 8.5 8.5  NEUTROABS  --  3.9  --  4.6  HGB 8.1* 8.4* 8.6* 8.2*  HCT 25.9* 27.1* 27.0* 26.4*  MCV 87.8 87.4 87.4 87.7  PLT 24* 26* 25* 26*   Basic Metabolic Panel: Recent Labs  Lab 05/09/21 1138 05/10/21 0205  NA 142 141  K 3.9 4.5  CL 105 105  CO2 27 23  GLUCOSE 160* 173*  BUN 45* 52*  CREATININE 6.21* 6.66*  CALCIUM 8.6* 8.3*  MG  --  1.8  PHOS  --  5.5*   GFR: Estimated Creatinine Clearance: 5.6 mL/min (A) (by C-G formula based on SCr of 6.66 mg/dL (H)). Liver Function Tests: Recent Labs  Lab 05/10/21 0205  ALBUMIN 2.6*   No results for input(s): LIPASE, AMYLASE in  the last 168 hours. No results for input(s): AMMONIA in the last 168 hours. Coagulation Profile: No results for input(s): INR, PROTIME in the last 168 hours. Cardiac Enzymes: No results for input(s): CKTOTAL, CKMB, CKMBINDEX, TROPONINI in the last 168 hours. BNP (last 3 results) No results for input(s): PROBNP in the last 8760 hours. HbA1C: Recent Labs    05/10/21 0205  HGBA1C 6.0*   CBG: Recent Labs  Lab 05/10/21 0822 05/10/21 1232  GLUCAP 157* 72   Lipid Profile: Recent Labs    05/10/21 0205  CHOL 112  HDL 18*  LDLCALC 67  TRIG 136  CHOLHDL 6.2  LDLDIRECT 58.4   Thyroid Function Tests: No results for input(s): TSH, T4TOTAL, FREET4, T3FREE, THYROIDAB  in the last 72 hours. Anemia Panel: Recent Labs    05/10/21 0205  FERRITIN 1,351*   Sepsis Labs: No results for input(s): PROCALCITON, LATICACIDVEN in the last 168 hours.  Recent Results (from the past 240 hour(s))  Resp Panel by RT-PCR (Flu A&B, Covid) Nasopharyngeal Swab     Status: Abnormal   Collection Time: 05/09/21  4:10 PM   Specimen: Nasopharyngeal Swab; Nasopharyngeal(NP) swabs in vial transport medium  Result Value Ref Range Status   SARS Coronavirus 2 by RT PCR POSITIVE (A) NEGATIVE Final    Comment: RESULT CALLED TO, READ BACK BY AND VERIFIED WITH: A BANKS RN 1660 05/09/21 A BROWNING (NOTE) SARS-CoV-2 target nucleic acids are DETECTED.  The SARS-CoV-2 RNA is generally detectable in upper respiratory specimens during the acute phase of infection. Positive results are indicative of the presence of the identified virus, but do not rule out bacterial infection or co-infection with other pathogens not detected by the test. Clinical correlation with patient history and other diagnostic information is necessary to determine patient infection status. The expected result is Negative.  Fact Sheet for Patients: EntrepreneurPulse.com.au  Fact Sheet for Healthcare  Providers: IncredibleEmployment.be  This test is not yet approved or cleared by the [place] FDA and  has been authorized for detection and/or diagnosis of SARS-CoV-2 by FDA under an Emergency Use Authorization (EUA).  This EUA will remain in effect (meaning this test can b e used) for the duration of  the COVID-19 declaration under Section 564(b)(1) of the Act, 21 U.S.C. section 360bbb-3(b)(1), unless the authorization is terminated or revoked sooner.     Influenza A by PCR NEGATIVE NEGATIVE Final   Influenza B by PCR NEGATIVE NEGATIVE Final    Comment: (NOTE) The Xpert Xpress SARS-CoV-2/FLU/RSV plus assay is intended as an aid in the diagnosis of influenza from Nasopharyngeal swab specimens and should not be used as a sole basis for treatment. Nasal washings and aspirates are unacceptable for Xpert Xpress SARS-CoV-2/FLU/RSV testing.  Fact Sheet for Patients: EntrepreneurPulse.com.au  Fact Sheet for Healthcare Providers: IncredibleEmployment.be  This test is not yet approved or cleared by the [place] FDA and has been authorized for detection and/or diagnosis of SARS-CoV-2 by FDA under an Emergency Use Authorization (EUA). This EUA will remain in effect (meaning this test can be used) for the duration of the COVID-19 declaration under Section 564(b)(1) of the Act, 21 U.S.C. section 360bbb-3(b)(1), unless the authorization is terminated or revoked.  Performed at [facility], [place] [address] [place], [place] [address]       Radiology Studies: CT Head Wo Contrast  Result Date: 05/09/2021 CLINICAL DATA:  Neuro deficit, acute stroke suspected. Altered mental status. EXAM: CT HEAD WITHOUT CONTRAST TECHNIQUE: Contiguous axial images were obtained from the base of the skull through the vertex without intravenous contrast. COMPARISON:  CT head January 12, 2019. FINDINGS: Brain: New amorphous 1.6 x 1.6 x 1.1 cm  area of hyperdensity within the right cerebellar hemisphere. The surrounding hypodensity appears to be encephalomalacia rather than edema. Also, there is no significant mass effect. No evidence acute large vascular territory infarct. No midline shift. Basal cisterns are patent. Generalized atrophy with ex vacuo ventricular dilation, progressed from the prior. No hydrocephalus. No extra-axial fluid collection. Small remote left cerebellar lacunar infarct with suspected additional small area of dystrophic calcification in the inferior left cerebellum. Mild patchy white matter hypoattenuation, likely the sequela of chronic microvascular ischemic disease. Vascular: No hyperdense vessel identified. Calcific intracranial atherosclerosis. Skull:  No acute fracture. Sinuses/Orbits: Severe pansinus disease with complete opacification of bilateral frontal sinuses, near complete opacification of the ethmoid air cells and left sphenoid sinus and moderate mucosal thickening of the remaining sinuses. Other: Trace bilateral mastoid fluid with right greater than left middle ear fluid. IMPRESSION: 1. New 1.6 cm amorphous area of hyperdensity within the right cerebellar hemisphere, which has an appearance suggestive of an area of dystrophic calcification from prior insult. The appearance is atypical for acute hemorrhage, particularly given suggestion of surrounding encephalomalacia and no significant mass effect. An MRI with contrast is recommended to further characterize and to exclude a mass. 2. Small remote left cerebellar lacunar infarct with suspected additional small area of calcification in the inferior left cerebellum. 3. No evidence of acute large vascular territory infarct. 4. Moderate atrophy and mild chronic microvascular ischemic disease. 5. Severe pansinusitis. 6. Trace bilateral mastoid fluid with right greater than left middle ear fluid. Findings and recommendations discussed with Dr. Regenia Skeeter via telephone at 1:30  p.m. Electronically Signed   By: Margaretha Sheffield MD   On: 05/09/2021 14:00   MR ANGIO HEAD WO CONTRAST  Result Date: 05/10/2021 CLINICAL DATA:  Stroke follow-up EXAM: MRA NECK WITHOUT CONTRAST MRA HEAD WITHOUT CONTRAST TECHNIQUE: Multiplanar and multiecho pulse sequences of the neck were obtained without intravenous contrast. Angiographic images of the neck were obtained using MRA technique without intravenous contrast; Angiographic images of the Circle of Willis were obtained using MRA technique without intravenous contrast. COMPARISON:  None. FINDINGS: MRA NECK FINDINGS Normal carotid and vertebral arteries. MRA HEAD FINDINGS POSTERIOR CIRCULATION: --Vertebral arteries: Normal --Inferior cerebellar arteries: Normal. --Basilar artery: Normal. --Superior cerebellar arteries: Normal. --Posterior cerebral arteries: Narrowing of the right PCA distal P2 segment. Otherwise normal. ANTERIOR CIRCULATION: --Intracranial internal carotid arteries: Normal. --Anterior cerebral arteries (ACA): Normal. Absent left A1 segment, normal variant --Middle cerebral arteries (MCA): Normal. ANATOMIC VARIANTS: None IMPRESSION: Normal MRA of the head and neck. Electronically Signed   By: Ulyses Jarred M.D.   On: 05/10/2021 03:06   MR ANGIO NECK WO CONTRAST  Result Date: 05/10/2021 CLINICAL DATA:  Stroke follow-up EXAM: MRA NECK WITHOUT CONTRAST MRA HEAD WITHOUT CONTRAST TECHNIQUE: Multiplanar and multiecho pulse sequences of the neck were obtained without intravenous contrast. Angiographic images of the neck were obtained using MRA technique without intravenous contrast; Angiographic images of the Circle of Willis were obtained using MRA technique without intravenous contrast. COMPARISON:  None. FINDINGS: MRA NECK FINDINGS Normal carotid and vertebral arteries. MRA HEAD FINDINGS POSTERIOR CIRCULATION: --Vertebral arteries: Normal --Inferior cerebellar arteries: Normal. --Basilar artery: Normal. --Superior cerebellar arteries:  Normal. --Posterior cerebral arteries: Narrowing of the right PCA distal P2 segment. Otherwise normal. ANTERIOR CIRCULATION: --Intracranial internal carotid arteries: Normal. --Anterior cerebral arteries (ACA): Normal. Absent left A1 segment, normal variant --Middle cerebral arteries (MCA): Normal. ANATOMIC VARIANTS: None IMPRESSION: Normal MRA of the head and neck. Electronically Signed   By: Ulyses Jarred M.D.   On: 05/10/2021 03:06   MR BRAIN WO CONTRAST  Result Date: 05/09/2021 EXAM: MRI HEAD WITHOUT CONTRAST TECHNIQUE: Multiplanar, multiecho pulse sequences of the brain and surrounding structures were obtained without intravenous contrast. COMPARISON:  Same day CT head. FINDINGS: Brain: Small acute left thalamocapsular infarct. Encephalomalacia in the right cerebellum with associated susceptibility artifact. Smaller remote infarcts in the left cerebellum with some areas of susceptibility artifact. Moderate scattered T2/FLAIR hyperintensities in the supratentorial and infratentorial white matter, nonspecific but most likely related to chronic microvascular ischemic disease. Moderate atrophy with ex vacuo ventricular dilation.  No hydrocephalus. No evidence of acute hemorrhage. No extra-axial fluid collections. No evidence of a mass lesion. Vascular: Major arterial flow voids are maintained at the skull base. Skull and upper cervical spine: Normal marrow signal. Sinuses/Orbits: Severe pansinusitis. Other: Small bilateral mastoid effusions with bilateral middle ear fluid. IMPRESSION: 1. Small acute left thalamocapsular infarct. Faint edema without mass effect. 2. Encephalomalacia in the right cerebellum with associated susceptibility artifact, likely the sequela of prior infarct with dystrophic calcification and/or prior hemorrhage. Smaller remote infarcts in the left cerebellum. 3. Moderate chronic microvascular ischemic disease 4. Severe pansinusitis. 5. Small bilateral mastoid effusions with bilateral middle  ear fluid. Electronically Signed   By: Margaretha Sheffield MD   On: 05/09/2021 17:59   DG Chest Portable 1 View  Result Date: 05/09/2021 CLINICAL DATA:  Shortness of breath, generalized weakness, unable to talk in complete sentences, end-stage renal disease on dialysis, missed dialysis today due to weakness EXAM: PORTABLE CHEST 1 VIEW COMPARISON:  Portable exam 1123 hours compared to 01/12/2019 FINDINGS: Enlargement of cardiac silhouette with pulmonary vascular congestion. Atherosclerotic calcification aorta. Mild interstitial infiltrates consistent with pulmonary edema and CHF versus fluid overload. LEFT pleural effusion and basilar atelectasis present. No pneumothorax or acute osseous findings. IMPRESSION: Enlargement of cardiac silhouette with pulmonary vascular congestion and mild diffuse pulmonary edema. LEFT pleural effusion and LEFT basilar atelectasis. Aortic Atherosclerosis (ICD10-I70.0). Electronically Signed   By: Lavonia Dana M.D.   On: 05/09/2021 11:53    Scheduled Meds:  (feeding supplement) PROSource Plus  30 mL Oral BID BM   vitamin C  500 mg Oral Daily   atorvastatin  80 mg Oral Daily   [START ON 05/11/2021] doxercalciferol  2 mcg Intravenous Q T,Th,Sa-HD   insulin aspart  0-5 Units Subcutaneous QHS   insulin aspart  0-9 Units Subcutaneous TID WC   Ipratropium-Albuterol  1 puff Inhalation Q6H   methylPREDNISolone (SOLU-MEDROL) injection  25 mg Intravenous Q12H   Followed by   Derrill Memo ON 05/13/2021] predniSONE  50 mg Oral Daily   multivitamin with minerals  1 tablet Oral Daily   Zanubrutinib  2 capsule Oral Daily   zinc sulfate  220 mg Oral Daily   Continuous Infusions:  [START ON 05/11/2021] remdesivir 100 mg in NS 100 mL       LOS: 1 day   Time spent: 36 min   Darliss Cheney, MD [facility]  05/10/2021, 4:11 PM   How to contact the [facility] Attending or Consulting provider [place] or covering provider during after hours [place], for this patient?  Check the care team in  [facility] and look for a) attending/consulting [facility] provider listed and b) the [facility] team listed. Page or secure chat [phone]. Log into [email or web address] and use [place]'s universal password to access. If you do not have the password, please contact the hospital operator. Locate the [facility] provider you are looking for under [facility] and page to a number that you can be directly reached. If you still have difficulty reaching the provider, please page the [facility] (Director on Call) for the Hospitalists listed on amion for assistance.

## 2021-05-10 NOTE — ED Notes (Signed)
Report received from Angelica, ED RN. Pt back from HD. Alert, NAD, calm, interactive, speech clear, HOH. Denies pain or other complaints.

## 2021-05-10 NOTE — ED Notes (Addendum)
Patient transported to MRI 

## 2021-05-10 NOTE — ED Notes (Addendum)
Report given to hemodialysis RN , Rm [id number]

## 2021-05-10 NOTE — Progress Notes (Signed)
SLP Cancellation Note  Patient Details Name: Christine Gibson MRN: [id number] DOB: 1950/11/11   Cancelled treatment:       Reason Eval/Treat Not Completed: Patient at procedure or test/unavailable   Xanthe Couillard, Katherene Ponto 05/10/2021, 12:41 PM

## 2021-05-10 NOTE — Evaluation (Addendum)
Physical Therapy Evaluation Patient Details Name: Christine Gibson MRN: [id number] DOB: 1951/03/16 Today's Date: 05/10/2021   History of Present Illness  70 y.o. F admitted to [facility] due to R sided weakness and difficulty ambulating. MRI on 7/20 shows small acute L thalamocapsular infarct. COVID +. Prior medical history significant for ESRD on HD on TTS, CVA, CLL, thrombocytopenia, anemia of chronic disease. Pt reports that she is on Hospice, confirmed with husband.  Clinical Impression  PTA, pt lives with her spouse, requires assist for bathing, and is a limited household ambulator with no AD. Pt spouse reports she was recently discharged from a SNF. Pt presents with mild right sided weakness, decreased endurance, balance deficits and cognitive impairments. Pt requiring min assist for functional mobility. Ambulating ~2 ft with a RW before requesting to sit down. SpO2 100% on 2L O2 (pt reports on oxygen at baseline). Recommend SNF at discharge to address deficits, maximize functional mobility and decrease caregiver burden.     Follow Up Recommendations SNF;Supervision/Assistance - 24 hour    Equipment Recommendations  3in1 (PT)    Recommendations for Other Services       Precautions / Restrictions Precautions Precautions: Fall Restrictions Weight Bearing Restrictions: No      Mobility  Bed Mobility Overal bed mobility: Needs Assistance Bed Mobility: Supine to Sit     Supine to sit: Min guard          Transfers Overall transfer level: Needs assistance Equipment used: 1 person hand held assist;Rolling walker (2 wheeled) Transfers: Sit to/from Stand Sit to Stand: Min assist         General transfer comment: Min A to power up and steady  Ambulation/Gait Ambulation/Gait assistance: Min Web designer (Feet): 2 Feet Assistive device: Rolling walker (2 wheeled) Gait Pattern/deviations: Step-through pattern;Decreased stride length;Narrow base of support Gait velocity:  decreased Gait velocity interpretation: <1.8 ft/sec, indicate of risk for recurrent falls General Gait Details: Pt able to walk ~2 ft forwards and backwards with minA for balance, before requesting to sit.  Stairs            Wheelchair Mobility    Modified Rankin (Stroke Patients Only) Modified Rankin (Stroke Patients Only) Pre-Morbid Rankin Score: Moderately severe disability Modified Rankin: Moderately severe disability     Balance Overall balance assessment: History of Falls;Needs assistance Sitting-balance support: Single extremity supported;Feet supported Sitting balance-Leahy Scale: Fair   Postural control: Left lateral lean Standing balance support: Single extremity supported Standing balance-Leahy Scale: Poor                               Pertinent Vitals/Pain Pain Assessment: Faces Faces Pain Scale: Hurts little more Pain Location: generalized Pain Descriptors / Indicators: Grimacing Pain Intervention(s): Monitored during session    Home Living Family/patient expects to be discharged to:: Private residence Living Arrangements: Spouse/significant other Available Help at Discharge: Family Type of Home: House Home Access: Level entry     Home Layout: One level Home Equipment: Environmental consultant - 2 wheels;Shower seat      Prior Function Level of Independence: Needs assistance   Gait / Transfers Assistance Needed: reports she "doesn't walk good," but does not use AD  ADL's / Homemaking Assistance Needed: assist for bathing  Comments: does not drive, reports 3 falls in past month     Hand Dominance   Dominant Hand: Right    Extremity/Trunk Assessment   Upper Extremity Assessment Upper Extremity Assessment: Generalized  weakness    Lower Extremity Assessment Lower Extremity Assessment: RLE deficits/detail;LLE deficits/detail RLE Deficits / Details: Hip flexion 4/5, knee extension and ankle dorsiflexion 5/5 LLE Deficits / Details: Strength  5/5    Cervical / Trunk Assessment Cervical / Trunk Assessment: Normal  Communication   Communication: No difficulties  Cognition Arousal/Alertness: Awake/alert Behavior During Therapy: Flat affect Overall Cognitive Status: Impaired/Different from baseline Area of Impairment: Orientation;Memory;Safety/judgement;Awareness;Problem solving                 Orientation Level: Disoriented to;Place   Memory: Decreased short-term memory   Safety/Judgement: Decreased awareness of safety;Decreased awareness of deficits Awareness: Emergent Problem Solving: Slow processing;Difficulty sequencing;Requires verbal cues General Comments: When asked where she was at, pt repeated "Covid tree", then when testing sensation, pt repeated "left elbow" for all extremities.      General Comments General comments (skin integrity, edema, etc.): VSS on 2L    Exercises     Assessment/Plan    PT Assessment Patient needs continued PT services  PT Problem List Decreased strength;Decreased activity tolerance;Decreased balance;Decreased mobility;Decreased cognition;Decreased safety awareness       PT Treatment Interventions DME instruction;Stair training;Gait training;Functional mobility training;Therapeutic exercise;Therapeutic activities;Balance training;Patient/family education    PT Goals (Current goals can be found in the Care Plan section)  Acute Rehab PT Goals Patient Stated Goal: To feel better PT Goal Formulation: With patient Time For Goal Achievement: 05/24/21 Potential to Achieve Goals: Good    Frequency Min 3X/week   Barriers to discharge        Co-evaluation PT/OT/SLP Co-Evaluation/Treatment: Yes Reason for Co-Treatment: For patient/therapist safety;To address functional/ADL transfers PT goals addressed during session: Mobility/safety with mobility OT goals addressed during session: ADL's and self-care;Proper use of Adaptive equipment and DME       AM-PAC PT "6 Clicks"  Mobility  Outcome Measure Help needed turning from your back to your side while in a flat bed without using bedrails?: None Help needed moving from lying on your back to sitting on the side of a flat bed without using bedrails?: A Little Help needed moving to and from a bed to a chair (including a wheelchair)?: A Little Help needed standing up from a chair using your arms (e.g., wheelchair or bedside chair)?: A Little Help needed to walk in hospital room?: A Little Help needed climbing 3-5 steps with a railing? : A Lot 6 Click Score: 18    End of Session Equipment Utilized During Treatment: Gait belt;Oxygen Activity Tolerance: Patient tolerated treatment well Patient left: in chair;with call bell/phone within reach;with chair alarm set Nurse Communication: Mobility status PT Visit Diagnosis: Unsteadiness on feet (R26.81);Difficulty in walking, not elsewhere classified (R26.2)    Time: 6834-1962 PT Time Calculation (min) (ACUTE ONLY): 32 min   Charges:   PT Evaluation $PT Eval Moderate Complexity: 1 Mod          Wyona Almas, PT, DPT Acute Rehabilitation Services Pager [phone] Office [phone]   Deno Etienne 05/10/2021, 3:17 PM

## 2021-05-10 NOTE — Evaluation (Signed)
Occupational Therapy Evaluation Patient Details Name: Christine Gibson MRN: [id number] DOB: 07-01-1951 Today's Date: 05/10/2021    History of Present Illness 70 y.o. F admitted to [facility] due to R sided weakness and difficulty ambulating. MRI on 7/20 shows small acute L thalamocapsular infarct. COVID +. Prior medical history significant for ESRD on HD on TTS, CVA, CLL, thrombocytopenia, anemia of chronic disease. Pt reports that she is on Hospice, confirmed with husband.   Clinical Impression   Pt admitted to [facility] for concerns listed above. PTA pt's husband reported that she recently was discharged from a SNF. Once home pt required assist with bathing, however was otherwise independent with all ADL's, using no AD. This session, pt presents with mild R sided weakness, decreased activity tolerance, and some cognitive deficits. Pt requiring min-mod A for all ADL's, and at this time will benefit from Skilled therapies. Acute OT will follow to assist with deficits listed below.     Follow Up Recommendations  SNF;Supervision/Assistance - 24 hour    Equipment Recommendations  None recommended by OT    Recommendations for Other Services       Precautions / Restrictions Precautions Precautions: Fall Restrictions Weight Bearing Restrictions: No      Mobility Bed Mobility Overal bed mobility: Needs Assistance Bed Mobility: Supine to Sit     Supine to sit: Min guard          Transfers Overall transfer level: Needs assistance Equipment used: 1 person hand held assist Transfers: Sit to/from Stand Sit to Stand: Min assist         General transfer comment: Min A to power up and steady    Balance Overall balance assessment: History of Falls;Needs assistance Sitting-balance support: Single extremity supported;Feet supported Sitting balance-Leahy Scale: Fair   Postural control: Left lateral lean Standing balance support: Single extremity supported Standing balance-Leahy Scale: Poor                              ADL either performed or assessed with clinical judgement   ADL Overall ADL's : Needs assistance/impaired Eating/Feeding: Set up;Sitting   Grooming: Set up;Sitting   Upper Body Bathing: Minimal assistance;Sitting   Lower Body Bathing: Moderate assistance;Sit to/from stand;Sitting/lateral leans   Upper Body Dressing : Set up;Sitting   Lower Body Dressing: Moderate assistance;Sitting/lateral leans;Sit to/from stand   Toilet Transfer: Minimal assistance;Moderate assistance;Stand-pivot   Toileting- Clothing Manipulation and Hygiene: Moderate assistance;Sitting/lateral lean;Sit to/from stand   Tub/ Shower Transfer: Minimal assistance;Moderate assistance;Stand-pivot   Functional mobility during ADLs: Minimal assistance General ADL Comments: Pt activity tolerance limiting performance, as well as balance requiring pt to have more assist.     Vision Baseline Vision/History: Wears glasses Wears Glasses: Reading only Patient Visual Report: No change from baseline Vision Assessment?: Yes Eye Alignment: Within Functional Limits Ocular Range of Motion: Within Functional Limits Tracking/Visual Pursuits: Able to track stimulus in all quads without difficulty Saccades: Within functional limits Visual Fields: No apparent deficits     Perception Perception Perception Tested?: Yes Perception Deficits: Body part identification Comments: Pt reported "Left Elbow" for all body parts when testing sensation.   Praxis Praxis Praxis tested?: Deficits Deficits: Motor Impersistence;Limb apraxia Praxis-Other Comments: Pt requires verbal cues to maintain attention on tasks, as well as demonstrating difficulty completing finger to nose assessment, movements slow and choppy.    Pertinent Vitals/Pain Pain Assessment: Faces Faces Pain Scale: Hurts little more Pain Location: generalized Pain Descriptors / Indicators: Grimacing  Pain Intervention(s): Monitored  during session;Repositioned     Hand Dominance Right   Extremity/Trunk Assessment Upper Extremity Assessment Upper Extremity Assessment: Generalized weakness   Lower Extremity Assessment Lower Extremity Assessment: Defer to PT evaluation   Cervical / Trunk Assessment Cervical / Trunk Assessment: Normal   Communication Communication Communication: No difficulties   Cognition Arousal/Alertness: Awake/alert Behavior During Therapy: Flat affect Overall Cognitive Status: Impaired/Different from baseline Area of Impairment: Orientation;Memory;Safety/judgement;Awareness;Problem solving                 Orientation Level: Disoriented to;Place   Memory: Decreased short-term memory   Safety/Judgement: Decreased awareness of safety;Decreased awareness of deficits Awareness: Emergent Problem Solving: Slow processing;Difficulty sequencing;Requires verbal cues General Comments: When asked where she was at, pt repeated "Covid tree", then when testing sensation, pt repeated "left elbow" for all extremities.   General Comments  VSS on 2L    Exercises     Shoulder Instructions      Home Living Family/patient expects to be discharged to:: Private residence Living Arrangements: Spouse/significant other Available Help at Discharge: Family Type of Home: House Home Access: Level entry     Home Layout: One level     Bathroom Shower/Tub: Occupational psychologist: Standard Bathroom Accessibility: No   Home Equipment: Environmental consultant - 2 wheels;Shower seat          Prior Functioning/Environment Level of Independence: Needs assistance  Gait / Transfers Assistance Needed: reports she "doesn't walk good," but does not use AD ADL's / Homemaking Assistance Needed: assist for bathing   Comments: does not drive, reports 3 falls in past month        OT Problem List: Decreased strength;Decreased activity tolerance;Impaired balance (sitting and/or standing);Decreased  coordination;Decreased cognition;Decreased safety awareness;Decreased knowledge of use of DME or AE      OT Treatment/Interventions: Self-care/ADL training;Therapeutic exercise;Energy conservation;DME and/or AE instruction;Therapeutic activities;Cognitive remediation/compensation;Patient/family education;Balance training    OT Goals(Current goals can be found in the care plan section) Acute Rehab OT Goals Patient Stated Goal: To feel better OT Goal Formulation: With patient Time For Goal Achievement: 05/24/21 Potential to Achieve Goals: Fair ADL Goals Pt Will Perform Grooming: with modified independence;standing Pt Will Perform Upper Body Bathing: with modified independence;sitting Pt Will Perform Lower Body Bathing: with supervision;sit to/from stand;sitting/lateral leans Pt Will Perform Upper Body Dressing: with modified independence;sitting;standing Pt Will Perform Lower Body Dressing: with modified independence;sitting/lateral leans;sit to/from stand Pt Will Transfer to Toilet: with supervision;ambulating Pt Will Perform Toileting - Clothing Manipulation and hygiene: with modified independence;sitting/lateral leans;sit to/from stand  OT Frequency: Min 2X/week   Barriers to D/C:            Co-evaluation PT/OT/SLP Co-Evaluation/Treatment: Yes Reason for Co-Treatment: For patient/therapist safety;To address functional/ADL transfers PT goals addressed during session: Mobility/safety with mobility;Balance;Proper use of DME OT goals addressed during session: ADL's and self-care;Proper use of Adaptive equipment and DME      AM-PAC OT "6 Clicks" Daily Activity     Outcome Measure Help from another person eating meals?: A Little Help from another person taking care of personal grooming?: A Little Help from another person toileting, which includes using toliet, bedpan, or urinal?: A Little Help from another person bathing (including washing, rinsing, drying)?: A Lot Help from another  person to put on and taking off regular upper body clothing?: A Little Help from another person to put on and taking off regular lower body clothing?: A Lot 6 Click Score: 16   End of Session  Equipment Utilized During Treatment: Administrator, arts Communication: Mobility status  Activity Tolerance: Patient limited by fatigue Patient left: in chair;with call bell/phone within reach;with chair alarm set  OT Visit Diagnosis: Unsteadiness on feet (R26.81);Other abnormalities of gait and mobility (R26.89);Muscle weakness (generalized) (M62.81);History of falling (Z91.81)                Time: 6681-5947 OT Time Calculation (min): 32 min Charges:  OT General Charges $OT Visit: 1 Visit OT Evaluation $OT Eval Moderate Complexity: 1 Mod OT Treatments $Self Care/Home Management : 8-22 mins  Caileen Veracruz H., OTR/L Acute Rehabilitation  Jakson Delpilar Elane Pegeen Stiger 05/10/2021, 2:35 PM

## 2021-05-11 DIAGNOSIS — D696 Thrombocytopenia, unspecified: Secondary | ICD-10-CM | POA: Diagnosis not present

## 2021-05-11 LAB — CBC WITH DIFFERENTIAL/PLATELET
Abs Immature Granulocytes: 0.11 10*3/uL — ABNORMAL HIGH (ref 0.00–0.07)
Basophils Absolute: 0 10*3/uL (ref 0.0–0.1)
Basophils Relative: 0 %
Eosinophils Absolute: 0.1 10*3/uL (ref 0.0–0.5)
Eosinophils Relative: 1 %
HCT: 30.9 % — ABNORMAL LOW (ref 36.0–46.0)
Hemoglobin: 9.9 g/dL — ABNORMAL LOW (ref 12.0–15.0)
Immature Granulocytes: 2 %
Lymphocytes Relative: 38 %
Lymphs Abs: 2.5 10*3/uL (ref 0.7–4.0)
MCH: 27.2 pg (ref 26.0–34.0)
MCHC: 32 g/dL (ref 30.0–36.0)
MCV: 84.9 fL (ref 80.0–100.0)
Monocytes Absolute: 0.6 10*3/uL (ref 0.1–1.0)
Monocytes Relative: 10 %
Neutro Abs: 3.2 10*3/uL (ref 1.7–7.7)
Neutrophils Relative %: 49 %
Platelets: 28 10*3/uL — CL (ref 150–400)
RBC: 3.64 MIL/uL — ABNORMAL LOW (ref 3.87–5.11)
RDW: 17.5 % — ABNORMAL HIGH (ref 11.5–15.5)
WBC: 6.5 10*3/uL (ref 4.0–10.5)
nRBC: 0 % (ref 0.0–0.2)

## 2021-05-11 LAB — RENAL FUNCTION PANEL
Albumin: 2.7 g/dL — ABNORMAL LOW (ref 3.5–5.0)
Anion gap: 14 (ref 5–15)
BUN: 39 mg/dL — ABNORMAL HIGH (ref 8–23)
CO2: 25 mmol/L (ref 22–32)
Calcium: 8.6 mg/dL — ABNORMAL LOW (ref 8.9–10.3)
Chloride: 100 mmol/L (ref 98–111)
Creatinine, Ser: 4.57 mg/dL — ABNORMAL HIGH (ref 0.44–1.00)
GFR, Estimated: 10 mL/min — ABNORMAL LOW (ref >60)
Glucose, Bld: 163 mg/dL — ABNORMAL HIGH (ref 70–99)
Phosphorus: 5.4 mg/dL — ABNORMAL HIGH (ref 2.5–4.6)
Potassium: 4.6 mmol/L (ref 3.5–5.1)
Sodium: 139 mmol/L (ref 135–145)

## 2021-05-11 LAB — FERRITIN: Ferritin: 1463 ng/mL — ABNORMAL HIGH (ref 11–307)

## 2021-05-11 LAB — MAGNESIUM: Magnesium: 2 mg/dL (ref 1.7–2.4)

## 2021-05-11 LAB — D-DIMER, QUANTITATIVE: D-Dimer, Quant: 0.95 ug/mL-FEU — ABNORMAL HIGH (ref 0.00–0.50)

## 2021-05-11 LAB — GLUCOSE, CAPILLARY
Glucose-Capillary: 188 mg/dL — ABNORMAL HIGH (ref 70–99)
Glucose-Capillary: 199 mg/dL — ABNORMAL HIGH (ref 70–99)
Glucose-Capillary: 238 mg/dL — ABNORMAL HIGH (ref 70–99)

## 2021-05-11 LAB — C DIFFICILE QUICK SCREEN W PCR REFLEX
C Diff antigen: NEGATIVE
C Diff interpretation: NOT DETECTED
C Diff toxin: NEGATIVE

## 2021-05-11 LAB — C-REACTIVE PROTEIN: CRP: 6.9 mg/dL — ABNORMAL HIGH (ref <1.0)

## 2021-05-11 MED ORDER — HYDRALAZINE HCL 20 MG/ML IJ SOLN
10.0000 mg | Freq: Four times a day (QID) | INTRAMUSCULAR | Status: DC | PRN
  Administered 2021-05-14 – 2021-05-15 (×2): 10 mg via INTRAVENOUS
  Filled 2021-05-11 (×2): qty 1

## 2021-05-11 NOTE — Evaluation (Signed)
Speech Language Pathology Evaluation Patient Details Name: DUANE TRIAS MRN: [id number] DOB: 01-Sep-1951 Today's Date: 05/11/2021 Time: 1345-1420 SLP Time Calculation (min) (ACUTE ONLY): 35 min  Problem List:  Patient Active Problem List   Diagnosis Date Noted   Acute CVA (cerebrovascular accident) ([place]) 05/09/2021   Thrombocytopenia ([place])    Iron deficiency anemia 01/06/2021   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/2021   CKD stage 5 secondary to hypertension ([place]) 07/29/2016   HLD (hyperlipidemia) 07/19/2015   HTN (hypertension) 07/19/2015   DM type 2 (diabetes mellitus, type 2) ([place]) 07/19/2015   CLL (chronic lymphocytic leukemia) ([place])    Past Medical History:  Past Medical History:  Diagnosis Date   Chronic kidney disease    Diabetes mellitus without complication ([place])    History of leukemia    Hypertension    Past Surgical History:  Past Surgical History:  Procedure Laterality Date   ABDOMINAL HYSTERECTOMY  1991   partial   CESAREAN SECTION     x 3   HPI:  TUNISIA LANDGREBE is a 70 y.o. female with medical history significant for ESRD on HD on TTS, CVA, CLL, thrombocytopenia, anemia of chronic disease, who presented to [facility] ED with complaints of R sided weakness and difficulty ambulating x 36 hours.  History is obtained from the patient and her husband via phone, poor historians.  CT head was abnormal, revealing new 1.6 cm amorphous area of hyperdensity within the right cerebellar hemisphere.  MRI brain revealed small acute left thalamocapsular infarct.  Faint edema without mass-effect.  Seen by neurology who recommended hospitalist admission for stroke work-up. Speech/language evaluation ordered; pt passed [facility] Swallow screen on 05/09/21.  Assessment / Plan / Recommendation Clinical Impression  Pt assessed partially for speech/language and cognitive function due to severe hearing impairment with written prompts and/or increased vocal intensity utilized during assessment.  Pt able to follow  3-step written directives without cueing provided.  Speech was judged to be 100% intelligible within simple conversation.  Previously at home with Hospice with baseline cognitive issues per chart review with husband providing care.  Pt oriented x4 and able to answer simple biographical/temporal questions.  Pt's hearing impairment affects overall comprehension paired with current dx of Covid with N95 masks worn by staff/caregivers which also contributes to decreased understanding.  Pt able to verbalize simple requests such as "My pain is an 8, I would like some medicine."  ST will f/u acutely for continued assessment of cognitive/linguistic function in setting of recent CVA with consideration of baseline cognitive functioning.  Thank you for this consult.    SLP Assessment  SLP Recommendation/Assessment: Patient needs continued Speech Lanaguage Pathology Services SLP Visit Diagnosis: Cognitive communication deficit (R41.841)    Follow Up Recommendations  Skilled Nursing facility    Frequency and Duration min 1 x/week  1 week      SLP Evaluation Cognition  Overall Cognitive Status: Difficult to assess Arousal/Alertness: Awake/alert Orientation Level: Oriented X4 Attention: Sustained Sustained Attention: Impaired (d/t pain level and HOH) Sustained Attention Impairment: Verbal basic;Functional basic Memory:  (DNA) Behaviors: Verbal agitation;Physical agitation Comments: Pt's HOH status impacted overall assessment and SLP wearing mask (N95) d/t recent Covid dx impacted pt comprehension as well       Comprehension  Auditory Comprehension Overall Auditory Comprehension: Other (comment) (DTA) Commands:  (Able to follow written directives up to 3 step) Conversation: Simple Interfering Components: Hearing;Processing speed;Pain Visual Recognition/Discrimination Discrimination: Within Function Limits Reading Comprehension Reading Status:  (decoding appears WFL, difficult to determine  comprehension)  Expression Expression Primary Mode of Expression: Verbal Verbal Expression Overall Verbal Expression: Appears within functional limits for tasks assessed Level of Generative/Spontaneous Verbalization: Conversation Naming:  (Simple confrontational tasks/answering questions re: personal info appear adequate) Interfering Components: Other (comment) (hearing impairment) Effective Techniques: Written cues Written Expression Written Expression: Not tested   Oral / Motor  Oral Motor/Sensory Function Overall Oral Motor/Sensory Function: Within functional limits Motor Speech Overall Motor Speech: Appears within functional limits for tasks assessed Respiration: Within functional limits Phonation: Normal Resonance: Within functional limits Articulation: Within functional limitis Intelligibility: Intelligible Motor Planning: Witnin functional limits Motor Speech Errors: Not applicable Interfering Components: Hearing loss Effective Techniques: Increased vocal intensity                       Elvina Sidle, M.S., CCC-SLP 05/11/2021, 3:09 PM

## 2021-05-11 NOTE — Progress Notes (Signed)
[name] was asked 7/20 to bring chemotherapy medication zanubrutinib because pharmacy does not supply it. Husband was not at bedside today to provide medication

## 2021-05-11 NOTE — Progress Notes (Signed)
Patient ID: Christine Gibson, female   DOB: [birth year], 70 y.o.   MRN: [id number] S: No events overnight O:BP (!) 153/57 (BP Location: Right Arm)   Pulse 77   Temp 98.5 F (36.9 C) (Oral)   Resp 20   Ht 5' (1.524 m)   Wt 49.9 kg   LMP  (LMP Unknown)   SpO2 100%   BMI 21.48 kg/m  No intake or output data in the 24 hours ending 05/11/21 1041 Intake/Output: I/O last 3 completed shifts: In: -  Out: 2000 [Other:2000]  Intake/Output this shift:  No intake/output data recorded. Weight change:  Physical exam: unable to complete due to COVID + status.  In order to preserve PPE equipment and to minimize exposure to providers.  Notes from other caregivers reviewed   Recent Labs  Lab 05/09/21 1138 05/10/21 0205 05/11/21 0122  NA 142 141 139  K 3.9 4.5 4.6  CL 105 105 100  CO2 27 23 25   GLUCOSE 160* 173* 163*  BUN 45* 52* 39*  CREATININE 6.21* 6.66* 4.57*  ALBUMIN  --  2.6* 2.7*  CALCIUM 8.6* 8.3* 8.6*  PHOS  --  5.5* 5.4*   Liver Function Tests: Recent Labs  Lab 05/10/21 0205 05/11/21 0122  ALBUMIN 2.6* 2.7*   No results for input(s): LIPASE, AMYLASE in the last 168 hours. No results for input(s): AMMONIA in the last 168 hours. CBC: Recent Labs  Lab 05/09/21 1138 05/09/21 1309 05/09/21 1956 05/10/21 0205 05/11/21 0122  WBC 7.6 7.8 8.5 8.5 6.5  NEUTROABS  --  3.9  --  4.6 3.2  HGB 8.1* 8.4* 8.6* 8.2* 9.9*  HCT 25.9* 27.1* 27.0* 26.4* 30.9*  MCV 87.8 87.4 87.4 87.7 84.9  PLT 24* 26* 25* 26* 28*   Cardiac Enzymes: No results for input(s): CKTOTAL, CKMB, CKMBINDEX, TROPONINI in the last 168 hours. CBG: Recent Labs  Lab 05/10/21 0822 05/10/21 1232 05/10/21 1621 05/10/21 2222 05/11/21 0632  GLUCAP 157* 72 130* 170* 188*    Iron Studies:  Recent Labs    05/11/21 0122  FERRITIN 1,463*   Studies/Results: CT Head Wo Contrast  Result Date: 05/09/2021 CLINICAL DATA:  Neuro deficit, acute stroke suspected. Altered mental status. EXAM: CT HEAD WITHOUT CONTRAST  TECHNIQUE: Contiguous axial images were obtained from the base of the skull through the vertex without intravenous contrast. COMPARISON:  CT head January 12, 2019. FINDINGS: Brain: New amorphous 1.6 x 1.6 x 1.1 cm area of hyperdensity within the right cerebellar hemisphere. The surrounding hypodensity appears to be encephalomalacia rather than edema. Also, there is no significant mass effect. No evidence acute large vascular territory infarct. No midline shift. Basal cisterns are patent. Generalized atrophy with ex vacuo ventricular dilation, progressed from the prior. No hydrocephalus. No extra-axial fluid collection. Small remote left cerebellar lacunar infarct with suspected additional small area of dystrophic calcification in the inferior left cerebellum. Mild patchy white matter hypoattenuation, likely the sequela of chronic microvascular ischemic disease. Vascular: No hyperdense vessel identified. Calcific intracranial atherosclerosis. Skull: No acute fracture. Sinuses/Orbits: Severe pansinus disease with complete opacification of bilateral frontal sinuses, near complete opacification of the ethmoid air cells and left sphenoid sinus and moderate mucosal thickening of the remaining sinuses. Other: Trace bilateral mastoid fluid with right greater than left middle ear fluid. IMPRESSION: 1. New 1.6 cm amorphous area of hyperdensity within the right cerebellar hemisphere, which has an appearance suggestive of an area of dystrophic calcification from prior insult. The appearance is atypical for acute hemorrhage, particularly  given suggestion of surrounding encephalomalacia and no significant mass effect. An MRI with contrast is recommended to further characterize and to exclude a mass. 2. Small remote left cerebellar lacunar infarct with suspected additional small area of calcification in the inferior left cerebellum. 3. No evidence of acute large vascular territory infarct. 4. Moderate atrophy and mild chronic  microvascular ischemic disease. 5. Severe pansinusitis. 6. Trace bilateral mastoid fluid with right greater than left middle ear fluid. Findings and recommendations discussed with Dr. Regenia Skeeter via telephone at 1:30 p.m. Electronically Signed   By: Margaretha Sheffield MD   On: 05/09/2021 14:00   MR ANGIO HEAD WO CONTRAST  Result Date: 05/10/2021 CLINICAL DATA:  Stroke follow-up EXAM: MRA NECK WITHOUT CONTRAST MRA HEAD WITHOUT CONTRAST TECHNIQUE: Multiplanar and multiecho pulse sequences of the neck were obtained without intravenous contrast. Angiographic images of the neck were obtained using MRA technique without intravenous contrast; Angiographic images of the Circle of Willis were obtained using MRA technique without intravenous contrast. COMPARISON:  None. FINDINGS: MRA NECK FINDINGS Normal carotid and vertebral arteries. MRA HEAD FINDINGS POSTERIOR CIRCULATION: --Vertebral arteries: Normal --Inferior cerebellar arteries: Normal. --Basilar artery: Normal. --Superior cerebellar arteries: Normal. --Posterior cerebral arteries: Narrowing of the right PCA distal P2 segment. Otherwise normal. ANTERIOR CIRCULATION: --Intracranial internal carotid arteries: Normal. --Anterior cerebral arteries (ACA): Normal. Absent left A1 segment, normal variant --Middle cerebral arteries (MCA): Normal. ANATOMIC VARIANTS: None IMPRESSION: Normal MRA of the head and neck. Electronically Signed   By: Ulyses Jarred M.D.   On: 05/10/2021 03:06   MR ANGIO NECK WO CONTRAST  Result Date: 05/10/2021 CLINICAL DATA:  Stroke follow-up EXAM: MRA NECK WITHOUT CONTRAST MRA HEAD WITHOUT CONTRAST TECHNIQUE: Multiplanar and multiecho pulse sequences of the neck were obtained without intravenous contrast. Angiographic images of the neck were obtained using MRA technique without intravenous contrast; Angiographic images of the Circle of Willis were obtained using MRA technique without intravenous contrast. COMPARISON:  None. FINDINGS: MRA NECK  FINDINGS Normal carotid and vertebral arteries. MRA HEAD FINDINGS POSTERIOR CIRCULATION: --Vertebral arteries: Normal --Inferior cerebellar arteries: Normal. --Basilar artery: Normal. --Superior cerebellar arteries: Normal. --Posterior cerebral arteries: Narrowing of the right PCA distal P2 segment. Otherwise normal. ANTERIOR CIRCULATION: --Intracranial internal carotid arteries: Normal. --Anterior cerebral arteries (ACA): Normal. Absent left A1 segment, normal variant --Middle cerebral arteries (MCA): Normal. ANATOMIC VARIANTS: None IMPRESSION: Normal MRA of the head and neck. Electronically Signed   By: Ulyses Jarred M.D.   On: 05/10/2021 03:06   MR BRAIN WO CONTRAST  Result Date: 05/09/2021 EXAM: MRI HEAD WITHOUT CONTRAST TECHNIQUE: Multiplanar, multiecho pulse sequences of the brain and surrounding structures were obtained without intravenous contrast. COMPARISON:  Same day CT head. FINDINGS: Brain: Small acute left thalamocapsular infarct. Encephalomalacia in the right cerebellum with associated susceptibility artifact. Smaller remote infarcts in the left cerebellum with some areas of susceptibility artifact. Moderate scattered T2/FLAIR hyperintensities in the supratentorial and infratentorial white matter, nonspecific but most likely related to chronic microvascular ischemic disease. Moderate atrophy with ex vacuo ventricular dilation. No hydrocephalus. No evidence of acute hemorrhage. No extra-axial fluid collections. No evidence of a mass lesion. Vascular: Major arterial flow voids are maintained at the skull base. Skull and upper cervical spine: Normal marrow signal. Sinuses/Orbits: Severe pansinusitis. Other: Small bilateral mastoid effusions with bilateral middle ear fluid. IMPRESSION: 1. Small acute left thalamocapsular infarct. Faint edema without mass effect. 2. Encephalomalacia in the right cerebellum with associated susceptibility artifact, likely the sequela of prior infarct with dystrophic  calcification and/or prior  hemorrhage. Smaller remote infarcts in the left cerebellum. 3. Moderate chronic microvascular ischemic disease 4. Severe pansinusitis. 5. Small bilateral mastoid effusions with bilateral middle ear fluid. Electronically Signed   By: Margaretha Sheffield MD   On: 05/09/2021 17:59   DG Chest Portable 1 View  Result Date: 05/09/2021 CLINICAL DATA:  Shortness of breath, generalized weakness, unable to talk in complete sentences, end-stage renal disease on dialysis, missed dialysis today due to weakness EXAM: PORTABLE CHEST 1 VIEW COMPARISON:  Portable exam 1123 hours compared to 01/12/2019 FINDINGS: Enlargement of cardiac silhouette with pulmonary vascular congestion. Atherosclerotic calcification aorta. Mild interstitial infiltrates consistent with pulmonary edema and CHF versus fluid overload. LEFT pleural effusion and basilar atelectasis present. No pneumothorax or acute osseous findings. IMPRESSION: Enlargement of cardiac silhouette with pulmonary vascular congestion and mild diffuse pulmonary edema. LEFT pleural effusion and LEFT basilar atelectasis. Aortic Atherosclerosis (ICD10-I70.0). Electronically Signed   By: Lavonia Dana M.D.   On: 05/09/2021 11:53   ECHOCARDIOGRAM COMPLETE BUBBLE STUDY  Result Date: 05/10/2021    ECHOCARDIOGRAM REPORT   Patient Name:   Christine Gibson Date of Exam: 05/10/2021 Medical Rec #:  [id number]       Height:       60.0 in Accession #:    [id number]      Weight:       110.0 lb Date of Birth:  [birth year]       BSA:          1.448 m Patient Age:    74 years        BP:           152/84 mmHg Patient Gender: F               HR:           75 bpm. Exam Location:  Inpatient Procedure: 2D Echo Indications:    Stroke I63.9  History:        Patient has prior history of Echocardiogram examinations, most                 recent 12/12/2012. Risk Factors:Hypertension, Diabetes and Covid                 Positive.  Sonographer:    Mikki Santee RDCS (AE) Referring Phys:  [id number] [place]  1. Left ventricular ejection fraction, by estimation, is 50%. The left ventricle has mildly decreased function. The left ventricle demonstrates global hypokinesis. There is mild left ventricular hypertrophy. Left ventricular diastolic parameters are consistent with Grade I diastolic dysfunction (impaired relaxation).  2. Right ventricular systolic function is normal. The right ventricular size is normal. There is normal pulmonary artery systolic pressure. The estimated right ventricular systolic pressure is 63.7 mmHg.  3. Left atrial size was mildly dilated.  4. The mitral valve is normal in structure. Mild mitral valve regurgitation. No evidence of mitral stenosis.  5. The aortic valve is tricuspid. Aortic valve regurgitation is not visualized. Mild aortic valve sclerosis is present, with no evidence of aortic valve stenosis.  6. The inferior vena cava is normal in size with greater than 50% respiratory variability, suggesting right atrial pressure of 3 mmHg.  7. No PFO or ASD by bubble study. FINDINGS  Left Ventricle: Left ventricular ejection fraction, by estimation, is 50%. The left ventricle has mildly decreased function. The left ventricle demonstrates global hypokinesis. The left ventricular internal cavity size was normal in size. There is mild left ventricular hypertrophy. Left ventricular  diastolic parameters are consistent with Grade I diastolic dysfunction (impaired relaxation). Right Ventricle: The right ventricular size is normal. No increase in right ventricular wall thickness. Right ventricular systolic function is normal. There is normal pulmonary artery systolic pressure. The tricuspid regurgitant velocity is 2.67 m/s, and  with an assumed right atrial pressure of 3 mmHg, the estimated right ventricular systolic pressure is 45.6 mmHg. Left Atrium: Left atrial size was mildly dilated. Right Atrium: Right atrial size was normal in size. Pericardium: There is no  evidence of pericardial effusion. Mitral Valve: The mitral valve is normal in structure. There is mild calcification of the mitral valve leaflet(s). Mild mitral annular calcification. Mild mitral valve regurgitation. No evidence of mitral valve stenosis. Tricuspid Valve: The tricuspid valve is normal in structure. Tricuspid valve regurgitation is mild. Aortic Valve: The aortic valve is tricuspid. Aortic valve regurgitation is not visualized. Mild aortic valve sclerosis is present, with no evidence of aortic valve stenosis. Pulmonic Valve: The pulmonic valve was normal in structure. Pulmonic valve regurgitation is not visualized. Aorta: The aortic root is normal in size and structure. Venous: The inferior vena cava is normal in size with greater than 50% respiratory variability, suggesting right atrial pressure of 3 mmHg. IAS/Shunts: No PFO or ASD by bubble study.  LEFT VENTRICLE PLAX 2D LVIDd:         4.10 cm      Diastology LVIDs:         3.20 cm      LV e' medial:    3.59 cm/s LV PW:         1.00 cm      LV E/e' medial:  27.3 LV IVS:        1.10 cm      LV e' lateral:   4.79 cm/s LVOT diam:     2.00 cm      LV E/e' lateral: 20.4 LV SV:         66 LV SV Index:   46 LVOT Area:     3.14 cm  LV Volumes (MOD) LV vol d, MOD A4C: 101.0 ml LV vol s, MOD A4C: 47.5 ml LV SV MOD A4C:     101.0 ml RIGHT VENTRICLE RV Basal diam:  2.60 cm RV S prime:     8.16 cm/s TAPSE (M-mode): 1.4 cm LEFT ATRIUM             Index       RIGHT ATRIUM           Index LA diam:        3.30 cm 2.28 cm/m  RA Area:     13.40 cm LA Vol (A2C):   55.9 ml 38.61 ml/m RA Volume:   32.10 ml  22.17 ml/m LA Vol (A4C):   50.6 ml 34.95 ml/m LA Biplane Vol: 56.4 ml 38.95 ml/m  AORTIC VALVE LVOT Vmax:   100.00 cm/s LVOT Vmean:  58.700 cm/s LVOT VTI:    0.210 m  AORTA Ao Root diam: 2.30 cm MITRAL VALVE                TRICUSPID VALVE MV Area (PHT): 4.39 cm     TR Peak grad:   28.5 mmHg MV Decel Time: 173 msec     TR Vmax:        267.00 cm/s MV E velocity:  97.90 cm/s MV A velocity: 142.00 cm/s  SHUNTS MV E/A ratio:  0.69  Systemic VTI:  0.21 m                             Systemic Diam: 2.00 cm Loralie Champagne MD Electronically signed by Loralie Champagne MD Signature Date/Time: 05/10/2021/4:40:38 PM    Final     (feeding supplement) PROSource Plus  30 mL Oral BID BM   vitamin C  500 mg Oral Daily   atorvastatin  40 mg Oral Daily   carvedilol  25 mg Oral BID WC   doxercalciferol  2 mcg Intravenous Q T,Th,Sa-HD   hydrALAZINE  25 mg Oral TID   insulin aspart  0-5 Units Subcutaneous QHS   insulin aspart  0-9 Units Subcutaneous TID WC   Ipratropium-Albuterol  1 puff Inhalation Q6H   losartan  100 mg Oral Daily   methylPREDNISolone (SOLU-MEDROL) injection  25 mg Intravenous Q12H   Followed by   Derrill Memo ON 05/13/2021] predniSONE  50 mg Oral Daily   multivitamin with minerals  1 tablet Oral Daily   Zanubrutinib  2 capsule Oral Daily   zinc sulfate  220 mg Oral Daily    BMET    Component Value Date/Time   NA 139 05/11/2021 0122   K 4.6 05/11/2021 0122   CL 100 05/11/2021 0122   CO2 25 05/11/2021 0122   GLUCOSE 163 (H) 05/11/2021 0122   BUN 39 (H) 05/11/2021 0122   CREATININE 4.57 (H) 05/11/2021 0122   CREATININE 4.72 (H) 01/06/2021 1615   CALCIUM 8.6 (L) 05/11/2021 0122   GFRNONAA 10 (L) 05/11/2021 0122   GFRAA 12 (L) 01/12/2019 0908   CBC    Component Value Date/Time   WBC 6.5 05/11/2021 0122   RBC 3.64 (L) 05/11/2021 0122   HGB 9.9 (L) 05/11/2021 0122   HCT 30.9 (L) 05/11/2021 0122   PLT 28 (LL) 05/11/2021 0122   MCV 84.9 05/11/2021 0122   MCH 27.2 05/11/2021 0122   MCHC 32.0 05/11/2021 0122   RDW 17.5 (H) 05/11/2021 0122   LYMPHSABS 2.5 05/11/2021 0122   MONOABS 0.6 05/11/2021 0122   EOSABS 0.1 05/11/2021 0122   BASOSABS 0.0 05/11/2021 0122    Dialysis Orders:  TTS [facility] 3:30, 450/500, EDW 44.5kg, 3K/2.5Ca, AVG, heparin 3000 - Hectoral 44mcg IV q HD - Mircera 140mcg IV q 2 weeks (last 7/9)   Assessment/Plan:  New acute  L thalamocapsular infarct: Work-up per neuro.  COVID positive: with new hypoxia and CXR infiltrate - started on IV solu-medrol and remdesivir. Will need isolation HD per clinic policies.  ESRD:  Continue HD per TTS schedule   Hypertension/volume: BP slightly high - permissive HTN no longer needed per Neurology.  Will UF as tolerated as she is above her edw.  Anemia: Hgb 9.9 - not due for ESA yet.  Metabolic bone disease: Ca/Phos ok, continue home binders and VDRA.  Nutrition:  Alb low, will add supplements.  CLL/thrombocytopenia - platelets up slightly to 28, no heparin with HD.  T2DM Disposition - pt with multiple irreversible/end stage disease processes and now with new stroke.  She is a full code and may benefit from goals of care discussion with palliative care at some point in her hospitalization.   Donetta Potts, MD [employer] [phone]

## 2021-05-11 NOTE — Progress Notes (Addendum)
PROGRESS NOTE    Christine Gibson  PYP:[id number] DOB: [birth year] DOA: 05/09/2021 PCP: Jearld Fenton, NP   Brief Narrative:  HPI: BHAVANA KADY is a 70 y.o. 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  History is obtained from the patient and her husband via phone, poor historians.  CT head was abnormal, revealing new 1.6 cm amorphous area of hyperdensity within the right cerebellar hemisphere.  MRI brain revealed small acute left thalamocapsular infarct.  Faint edema without mass-effect.  Seen by neurology who recommended hospitalist admission for stroke work-up.   ED Course: T-max 98.6.  BP 142/79, pulse 83, respirations 16, O2 saturation 100% on 3 L.  Assessment & Plan:   Active Problems:   Acute CVA (cerebrovascular accident) ([place])  Acute CVA, POA: MRI brain showed small acute left thalamocapsular infarct without mass-effect. MRA head and neck negative for CVO.  Echo shows 50% ejection fraction, grade 1 diastolic dysfunction but no ASD.  SLP cleared for diet.  PT OT recommends SNF.  Neurology cleared for discharge with no antiplatelet due to severe thrombocytopenia.  Blood pressure slightly elevated this morning but she had not received her most of antihypertensives so I will hydralazine, Coreg and losartan.  ESRD on HD TTS: Nephrology managing.  Prediabetes: Hemoglobin A1c 6.0.  Diet controlled prior to admission.  Currently slightly hyperglycemic due to being on dexamethasone.  Continue SSI.   Acute hypoxic respiratory failure secondary to acute combined systolic and diastolic CHF: Echo shows 24% ejection fraction and grade 1 diastolic dysfunction. Elevated BNP greater than 3000.  Chest x-ray also shows pulmonary edema.  Patient on room air now.   COVID-19 viral infection Self-reported positive COVID-19  screening test 03/15/2021 at outside facility Positive COVID test on 05/09/2021.  I personally do not see any infiltrates on the chest x-rayHowever she has elevated inflammatory markers.  She has been started on remdesivir and steroids.  We will continue total of 3 doses of remdesivir.   Hyperlipidemia: Continue Lipitor.   CLL/chronic thrombocytopenia: Platelets further down than her baseline, down to 26 today.  Per neurology, not a candidate for any antiplatelet.  Resume home medications.   Anemia of chronic disease: Hemoglobin at baseline.  Monitor.  DVT prophylaxis: SCDs Start: 05/09/21 1935   Code Status: Full Code  Family Communication: None present at bedside.  Plan of care discussed with patient and then with her husband over the phone.  He is agreeable with the plan to discharge to SNF.  Status is: Inpatient  Remains inpatient appropriate because:Ongoing diagnostic testing needed not appropriate for outpatient work up  Dispo:  Patient From: Home  Planned Disposition: [place]  Medically stable for discharge: yes      Estimated body mass index is 21.48 kg/m as calculated from the following:   Height as of this encounter: 5' (1.524 m).   Weight as of this encounter: 49.9 kg.      Nutritional status:               Consultants:  Neurology  Procedures:  NONE  Antimicrobials:  Anti-infectives (From admission, onward)    Start     Dose/Rate Route Frequency Ordered Stop   05/11/21 1000  remdesivir 100 mg in sodium chloride 0.9 % 100 mL IVPB  Status:  Discontinued  See Hyperspace for full Linked Orders Report.   100 mg 200 mL/hr over 30 Minutes Intravenous Daily 05/09/21 2326 05/10/21 1611   05/11/21 1000  remdesivir 100 mg in sodium chloride 0.9 % 100 mL IVPB       See Hyperspace for full Linked Orders Report.   100 mg 200 mL/hr over 30 Minutes Intravenous Daily 05/10/21 1611 05/13/21 0959   05/10/21 0030  remdesivir 200 mg in sodium  chloride 0.9% 250 mL IVPB       See Hyperspace for full Linked Orders Report.   200 mg 580 mL/hr over 30 Minutes Intravenous Once 05/09/21 2326 05/10/21 0105          Subjective: Seen and examined.  Fully alert and oriented.  She has no complaints.  Objective: Vitals:   05/10/21 2342 05/11/21 0441 05/11/21 0825 05/11/21 1218  BP: (!) 172/57 (!) 172/62 (!) 153/57 (!) 165/63  Pulse: 71 73 77 73  Resp: 20 18 20 20   Temp: 98.4 F (36.9 C) 98.2 F (36.8 C) 98.5 F (36.9 C) 98.7 F (37.1 C)  TempSrc: Oral Oral Oral Oral  SpO2: 100% 100% 100% 100%  Weight:      Height:       No intake or output data in the 24 hours ending 05/11/21 1331  Filed Weights   05/09/21 1106  Weight: 49.9 kg    Examination:  General exam: Appears calm and comfortable  Respiratory system: Diminished breath sounds bilaterally. Respiratory effort normal. Cardiovascular system: S1 & S2 heard, RRR. No JVD, murmurs, rubs, gallops or clicks. No pedal edema. Gastrointestinal system: Abdomen is nondistended, soft and nontender. No organomegaly or masses felt. Normal bowel sounds heard. Central nervous system: Alert and oriented. No focal neurological deficits. Extremities: Symmetric 5 x 5 power. Skin: No rashes, lesions or ulcers.  Psychiatry: Judgement and insight appear normal. Mood & affect appropriate.    Data Reviewed: I have personally reviewed following labs and imaging studies  CBC: Recent Labs  Lab 05/09/21 1138 05/09/21 1309 05/09/21 1956 05/10/21 0205 05/11/21 0122  WBC 7.6 7.8 8.5 8.5 6.5  NEUTROABS  --  3.9  --  4.6 3.2  HGB 8.1* 8.4* 8.6* 8.2* 9.9*  HCT 25.9* 27.1* 27.0* 26.4* 30.9*  MCV 87.8 87.4 87.4 87.7 84.9  PLT 24* 26* 25* 26* 28*    Basic Metabolic Panel: Recent Labs  Lab 05/09/21 1138 05/10/21 0205 05/11/21 0122  NA 142 141 139  K 3.9 4.5 4.6  CL 105 105 100  CO2 27 23 25   GLUCOSE 160* 173* 163*  BUN 45* 52* 39*  CREATININE 6.21* 6.66* 4.57*  CALCIUM 8.6*  8.3* 8.6*  MG  --  1.8 2.0  PHOS  --  5.5* 5.4*    GFR: Estimated Creatinine Clearance: 8.2 mL/min (A) (by C-G formula based on SCr of 4.57 mg/dL (H)). Liver Function Tests: Recent Labs  Lab 05/10/21 0205 05/11/21 0122  ALBUMIN 2.6* 2.7*    No results for input(s): LIPASE, AMYLASE in the last 168 hours. No results for input(s): AMMONIA in the last 168 hours. Coagulation Profile: No results for input(s): INR, PROTIME in the last 168 hours. Cardiac Enzymes: No results for input(s): CKTOTAL, CKMB, CKMBINDEX, TROPONINI in the last 168 hours. BNP (last 3 results) No results for input(s): PROBNP in the last 8760 hours. HbA1C: Recent Labs    05/10/21 0205  HGBA1C 6.0*    CBG: Recent Labs  Lab 05/10/21 1232 05/10/21 1621 05/10/21 2222 05/11/21 1607 05/11/21 1217  GLUCAP 72 130* 170* 188* 199*    Lipid Profile: Recent Labs    05/10/21 0205  CHOL 112  HDL 18*  LDLCALC 67  TRIG 136  CHOLHDL 6.2  LDLDIRECT 58.4    Thyroid Function Tests: No results for input(s): TSH, T4TOTAL, FREET4, T3FREE, THYROIDAB in the last 72 hours. Anemia Panel: Recent Labs    05/10/21 0205 05/11/21 0122  FERRITIN 1,351* 1,463*    Sepsis Labs: No results for input(s): PROCALCITON, LATICACIDVEN in the last 168 hours.  Recent Results (from the past 240 hour(s))  Resp Panel by RT-PCR (Flu A&B, Covid) Nasopharyngeal Swab     Status: Abnormal   Collection Time: 05/09/21  4:10 PM   Specimen: Nasopharyngeal Swab; Nasopharyngeal(NP) swabs in vial transport medium  Result Value Ref Range Status   SARS Coronavirus 2 by RT PCR POSITIVE (A) NEGATIVE Final    Comment: RESULT CALLED TO, READ BACK BY AND VERIFIED WITH: A BANKS RN [id number] 05/09/21 A BROWNING (NOTE) SARS-CoV-2 target nucleic acids are DETECTED.  The SARS-CoV-2 RNA is generally detectable in upper respiratory specimens during the acute phase of infection. Positive results are indicative of the presence of the identified virus, but  do not rule out bacterial infection or co-infection with other pathogens not detected by the test. Clinical correlation with patient history and other diagnostic information is necessary to determine patient infection status. The expected result is Negative.  Fact Sheet for Patients: EntrepreneurPulse.com.au  Fact Sheet for Healthcare Providers: IncredibleEmployment.be  This test is not yet approved or cleared by the [place] FDA and  has been authorized for detection and/or diagnosis of SARS-CoV-2 by FDA under an Emergency Use Authorization (EUA).  This EUA will remain in effect (meaning this test can b e used) for the duration of  the COVID-19 declaration under Section 564(b)(1) of the Act, 21 U.S.C. section 360bbb-3(b)(1), unless the authorization is terminated or revoked sooner.     Influenza A by PCR NEGATIVE NEGATIVE Final   Influenza B by PCR NEGATIVE NEGATIVE Final    Comment: (NOTE) The Xpert Xpress SARS-CoV-2/FLU/RSV plus assay is intended as an aid in the diagnosis of influenza from Nasopharyngeal swab specimens and should not be used as a sole basis for treatment. Nasal washings and aspirates are unacceptable for Xpert Xpress SARS-CoV-2/FLU/RSV testing.  Fact Sheet for Patients: EntrepreneurPulse.com.au  Fact Sheet for Healthcare Providers: IncredibleEmployment.be  This test is not yet approved or cleared by the [place] FDA and has been authorized for detection and/or diagnosis of SARS-CoV-2 by FDA under an Emergency Use Authorization (EUA). This EUA will remain in effect (meaning this test can be used) for the duration of the COVID-19 declaration under Section 564(b)(1) of the Act, 21 U.S.C. section 360bbb-3(b)(1), unless the authorization is terminated or revoked.  Performed at [facility], [place] [address], [place], [place] [address]        Radiology Studies: MR  ANGIO HEAD WO CONTRAST  Result Date: 05/10/2021 CLINICAL DATA:  Stroke follow-up EXAM: MRA NECK WITHOUT CONTRAST MRA HEAD WITHOUT CONTRAST TECHNIQUE: Multiplanar and multiecho pulse sequences of the neck were obtained without intravenous contrast. Angiographic images of the neck were obtained using MRA technique without intravenous contrast; Angiographic images of the Circle of Willis were obtained using MRA technique without intravenous contrast. COMPARISON:  None. FINDINGS: MRA NECK FINDINGS Normal carotid and vertebral arteries. MRA HEAD FINDINGS POSTERIOR CIRCULATION: --Vertebral arteries: Normal --Inferior cerebellar arteries: Normal. --Basilar artery: Normal. --Superior cerebellar arteries: Normal. --Posterior cerebral arteries: Narrowing  of the right PCA distal P2 segment. Otherwise normal. ANTERIOR CIRCULATION: --Intracranial internal carotid arteries: Normal. --Anterior cerebral arteries (ACA): Normal. Absent left A1 segment, normal variant --Middle cerebral arteries (MCA): Normal. ANATOMIC VARIANTS: None IMPRESSION: Normal MRA of the head and neck. Electronically Signed   By: Ulyses Jarred M.D.   On: 05/10/2021 03:06   MR ANGIO NECK WO CONTRAST  Result Date: 05/10/2021 CLINICAL DATA:  Stroke follow-up EXAM: MRA NECK WITHOUT CONTRAST MRA HEAD WITHOUT CONTRAST TECHNIQUE: Multiplanar and multiecho pulse sequences of the neck were obtained without intravenous contrast. Angiographic images of the neck were obtained using MRA technique without intravenous contrast; Angiographic images of the Circle of Willis were obtained using MRA technique without intravenous contrast. COMPARISON:  None. FINDINGS: MRA NECK FINDINGS Normal carotid and vertebral arteries. MRA HEAD FINDINGS POSTERIOR CIRCULATION: --Vertebral arteries: Normal --Inferior cerebellar arteries: Normal. --Basilar artery: Normal. --Superior cerebellar arteries: Normal. --Posterior cerebral arteries: Narrowing of the right PCA distal P2 segment.  Otherwise normal. ANTERIOR CIRCULATION: --Intracranial internal carotid arteries: Normal. --Anterior cerebral arteries (ACA): Normal. Absent left A1 segment, normal variant --Middle cerebral arteries (MCA): Normal. ANATOMIC VARIANTS: None IMPRESSION: Normal MRA of the head and neck. Electronically Signed   By: Ulyses Jarred M.D.   On: 05/10/2021 03:06   MR BRAIN WO CONTRAST  Result Date: 05/09/2021 EXAM: MRI HEAD WITHOUT CONTRAST TECHNIQUE: Multiplanar, multiecho pulse sequences of the brain and surrounding structures were obtained without intravenous contrast. COMPARISON:  Same day CT head. FINDINGS: Brain: Small acute left thalamocapsular infarct. Encephalomalacia in the right cerebellum with associated susceptibility artifact. Smaller remote infarcts in the left cerebellum with some areas of susceptibility artifact. Moderate scattered T2/FLAIR hyperintensities in the supratentorial and infratentorial white matter, nonspecific but most likely related to chronic microvascular ischemic disease. Moderate atrophy with ex vacuo ventricular dilation. No hydrocephalus. No evidence of acute hemorrhage. No extra-axial fluid collections. No evidence of a mass lesion. Vascular: Major arterial flow voids are maintained at the skull base. Skull and upper cervical spine: Normal marrow signal. Sinuses/Orbits: Severe pansinusitis. Other: Small bilateral mastoid effusions with bilateral middle ear fluid. IMPRESSION: 1. Small acute left thalamocapsular infarct. Faint edema without mass effect. 2. Encephalomalacia in the right cerebellum with associated susceptibility artifact, likely the sequela of prior infarct with dystrophic calcification and/or prior hemorrhage. Smaller remote infarcts in the left cerebellum. 3. Moderate chronic microvascular ischemic disease 4. Severe pansinusitis. 5. Small bilateral mastoid effusions with bilateral middle ear fluid. Electronically Signed   By: Margaretha Sheffield MD   On: 05/09/2021 17:59    ECHOCARDIOGRAM COMPLETE BUBBLE STUDY  Result Date: 05/10/2021    ECHOCARDIOGRAM REPORT   Patient Name:   Stephens Shire Date of Exam: 05/10/2021 Medical Rec #:  [id number]       Height:       60.0 in Accession #:    [id number]      Weight:       110.0 lb Date of Birth:  July 25, 1951       BSA:          1.448 m Patient Age:    53 years        BP:           152/84 mmHg Patient Gender: F               HR:           75 bpm. Exam Location:  Inpatient Procedure: 2D Echo Indications:    Stroke I63.9  History:  Patient has prior history of Echocardiogram examinations, most                 recent 12/12/2012. Risk Factors:Hypertension, Diabetes and Covid                 Positive.  Sonographer:    Mikki Santee RDCS (AE) Referring Phys: [id number] [place]  1. Left ventricular ejection fraction, by estimation, is 50%. The left ventricle has mildly decreased function. The left ventricle demonstrates global hypokinesis. There is mild left ventricular hypertrophy. Left ventricular diastolic parameters are consistent with Grade I diastolic dysfunction (impaired relaxation).  2. Right ventricular systolic function is normal. The right ventricular size is normal. There is normal pulmonary artery systolic pressure. The estimated right ventricular systolic pressure is 32.2 mmHg.  3. Left atrial size was mildly dilated.  4. The mitral valve is normal in structure. Mild mitral valve regurgitation. No evidence of mitral stenosis.  5. The aortic valve is tricuspid. Aortic valve regurgitation is not visualized. Mild aortic valve sclerosis is present, with no evidence of aortic valve stenosis.  6. The inferior vena cava is normal in size with greater than 50% respiratory variability, suggesting right atrial pressure of 3 mmHg.  7. No PFO or ASD by bubble study. FINDINGS  Left Ventricle: Left ventricular ejection fraction, by estimation, is 50%. The left ventricle has mildly decreased function. The left ventricle  demonstrates global hypokinesis. The left ventricular internal cavity size was normal in size. There is mild left ventricular hypertrophy. Left ventricular diastolic parameters are consistent with Grade I diastolic dysfunction (impaired relaxation). Right Ventricle: The right ventricular size is normal. No increase in right ventricular wall thickness. Right ventricular systolic function is normal. There is normal pulmonary artery systolic pressure. The tricuspid regurgitant velocity is 2.67 m/s, and  with an assumed right atrial pressure of 3 mmHg, the estimated right ventricular systolic pressure is 02.5 mmHg. Left Atrium: Left atrial size was mildly dilated. Right Atrium: Right atrial size was normal in size. Pericardium: There is no evidence of pericardial effusion. Mitral Valve: The mitral valve is normal in structure. There is mild calcification of the mitral valve leaflet(s). Mild mitral annular calcification. Mild mitral valve regurgitation. No evidence of mitral valve stenosis. Tricuspid Valve: The tricuspid valve is normal in structure. Tricuspid valve regurgitation is mild. Aortic Valve: The aortic valve is tricuspid. Aortic valve regurgitation is not visualized. Mild aortic valve sclerosis is present, with no evidence of aortic valve stenosis. Pulmonic Valve: The pulmonic valve was normal in structure. Pulmonic valve regurgitation is not visualized. Aorta: The aortic root is normal in size and structure. Venous: The inferior vena cava is normal in size with greater than 50% respiratory variability, suggesting right atrial pressure of 3 mmHg. IAS/Shunts: No PFO or ASD by bubble study.  LEFT VENTRICLE PLAX 2D LVIDd:         4.10 cm      Diastology LVIDs:         3.20 cm      LV e' medial:    3.59 cm/s LV PW:         1.00 cm      LV E/e' medial:  27.3 LV IVS:        1.10 cm      LV e' lateral:   4.79 cm/s LVOT diam:     2.00 cm      LV E/e' lateral: 20.4 LV SV:  66 LV SV Index:   46 LVOT Area:      3.14 cm  LV Volumes (MOD) LV vol d, MOD A4C: 101.0 ml LV vol s, MOD A4C: 47.5 ml LV SV MOD A4C:     101.0 ml RIGHT VENTRICLE RV Basal diam:  2.60 cm RV S prime:     8.16 cm/s TAPSE (M-mode): 1.4 cm LEFT ATRIUM             Index       RIGHT ATRIUM           Index LA diam:        3.30 cm 2.28 cm/m  RA Area:     13.40 cm LA Vol (A2C):   55.9 ml 38.61 ml/m RA Volume:   32.10 ml  22.17 ml/m LA Vol (A4C):   50.6 ml 34.95 ml/m LA Biplane Vol: 56.4 ml 38.95 ml/m  AORTIC VALVE LVOT Vmax:   100.00 cm/s LVOT Vmean:  58.700 cm/s LVOT VTI:    0.210 m  AORTA Ao Root diam: 2.30 cm MITRAL VALVE                TRICUSPID VALVE MV Area (PHT): 4.39 cm     TR Peak grad:   28.5 mmHg MV Decel Time: 173 msec     TR Vmax:        267.00 cm/s MV E velocity: 97.90 cm/s MV A velocity: 142.00 cm/s  SHUNTS MV E/A ratio:  0.69         Systemic VTI:  0.21 m                             Systemic Diam: 2.00 cm Loralie Champagne MD Electronically signed by Loralie Champagne MD Signature Date/Time: 05/10/2021/4:40:38 PM    Final     Scheduled Meds:  (feeding supplement) PROSource Plus  30 mL Oral BID BM   vitamin C  500 mg Oral Daily   atorvastatin  40 mg Oral Daily   carvedilol  25 mg Oral BID WC   doxercalciferol  2 mcg Intravenous Q T,Th,Sa-HD   hydrALAZINE  25 mg Oral TID   insulin aspart  0-5 Units Subcutaneous QHS   insulin aspart  0-9 Units Subcutaneous TID WC   Ipratropium-Albuterol  1 puff Inhalation Q6H   losartan  100 mg Oral Daily   methylPREDNISolone (SOLU-MEDROL) injection  25 mg Intravenous Q12H   Followed by   Derrill Memo ON 05/13/2021] predniSONE  50 mg Oral Daily   multivitamin with minerals  1 tablet Oral Daily   Zanubrutinib  2 capsule Oral Daily   zinc sulfate  220 mg Oral Daily   Continuous Infusions:  remdesivir 100 mg in NS 100 mL 100 mg (05/11/21 1135)     LOS: 2 days   Time spent: 30 min   Darliss Cheney, MD [facility]  05/11/2021, 1:31 PM   How to contact the [facility] Attending or Consulting provider  [place] or covering provider during after hours [place], for this patient?  Check the care team in [facility] and look for a) attending/consulting TRH provider listed and b) the [facility] team listed. Page or secure chat [phone]. Log into [email or web address] and use [place]'s universal password to access. If you do not have the password, please contact the hospital operator. Locate the [facility] provider you are looking for under [facility] and page to a number that you can be directly  reached. If you still have difficulty reaching the provider, please page the [facility] (Director on Call) for the Hospitalists listed on amion for assistance.

## 2021-05-11 NOTE — Plan of Care (Signed)

## 2021-05-11 NOTE — TOC Initial Note (Signed)
Transition of Care [facility]) - Initial/Assessment Note    Patient Details  Name: Christine Gibson MRN: [id number] Date of Birth: 20-Aug-1951  Transition of Care [facility]) CM/SW Contact:    Pollie Friar, RN Phone Number: 05/11/2021, 2:35 PM  Clinical Narrative:                 Pt is on covid isolation so CM reached out via phone to patients spouse. He also is feeling poorly at this time. He is interested in having pt go to SNF at d/c. He states he is not in the condition to provide the care the patient needs.  Pt was in a [place] SNF in [place] recently and then d/ced to home with hospice services through [employer].  With patient being hospice appropriate she will not qualify for rehab under her [facility] Medicare. Spouse prefers to use her VA benefits for SNF again.  Cm has reached out to [employer] with [facility] and sent them the required information for SNF. They will review the information and update CM.  TOC following.  Expected Discharge Plan: Skilled Nursing Facility Barriers to Discharge: Continued Medical Work up   Patient Goals and CMS Choice   CMS Medicare.gov Compare Post Acute Care list provided to:: Patient Represenative (must comment) Choice offered to / list presented to : Patient, Spouse  Expected Discharge Plan and Services Expected Discharge Plan: [place] In-house Referral: Clinical Social Work Discharge Planning Services: CM Consult Post Acute Care Choice: [place] arrangements for the past 2 months: [place]                                      Prior Living Arrangements/Services Living arrangements for the past 2 months: Single Family Home Lives with:: Spouse Patient language and need for interpreter reviewed:: Yes        Need for Family Participation in Patient Care: Yes (Comment) Care giver support system in place?: No (comment)   Criminal Activity/Legal Involvement Pertinent to Current  Situation/Hospitalization: No - Comment as needed  Activities of Daily Living      Permission Sought/Granted                  Emotional Assessment Appearance:: Appears stated age Attitude/Demeanor/Rapport: Engaged Affect (typically observed): Accepting Orientation: : Oriented to Self, Oriented to Place, Oriented to  Time, Oriented to Situation   Psych Involvement: No (comment)  Admission diagnosis:  Acute CVA (cerebrovascular accident) [facility]) [I63.9] Patient Active Problem List   Diagnosis Date Noted   Acute CVA (cerebrovascular accident) ([place]) 05/09/2021   Thrombocytopenia ([place])    Iron deficiency anemia 01/06/2021   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/2021   CKD stage 5 secondary to hypertension ([place]) 07/29/2016   HLD (hyperlipidemia) 07/19/2015   HTN (hypertension) 07/19/2015   DM type 2 (diabetes mellitus, type 2) ([place]) 07/19/2015   CLL (chronic lymphocytic leukemia) ([place])    PCP:  Jearld Fenton, NP Pharmacy:   [facility] [address] [place] - [place] [address] Phone: [phone] Fax: [place], [place], [place] [address], [place] [address] Phone: [phone] Fax: [phone]  [id number] [place], [place] [address] Phone: [phone] Fax: [phone]     Social Determinants of Health (SDOH)  Interventions    Readmission Risk Interventions No flowsheet data found.

## 2021-05-12 DIAGNOSIS — D696 Thrombocytopenia, unspecified: Secondary | ICD-10-CM | POA: Diagnosis not present

## 2021-05-12 LAB — CBC WITH DIFFERENTIAL/PLATELET
Abs Immature Granulocytes: 0.24 10*3/uL — ABNORMAL HIGH (ref 0.00–0.07)
Basophils Absolute: 0 10*3/uL (ref 0.0–0.1)
Basophils Relative: 0 %
Eosinophils Absolute: 0 10*3/uL (ref 0.0–0.5)
Eosinophils Relative: 0 %
HCT: 28.1 % — ABNORMAL LOW (ref 36.0–46.0)
Hemoglobin: 9.4 g/dL — ABNORMAL LOW (ref 12.0–15.0)
Immature Granulocytes: 3 %
Lymphocytes Relative: 38 %
Lymphs Abs: 3.2 10*3/uL (ref 0.7–4.0)
MCH: 27.6 pg (ref 26.0–34.0)
MCHC: 33.5 g/dL (ref 30.0–36.0)
MCV: 82.6 fL (ref 80.0–100.0)
Monocytes Absolute: 0.8 10*3/uL (ref 0.1–1.0)
Monocytes Relative: 9 %
Neutro Abs: 4.2 10*3/uL (ref 1.7–7.7)
Neutrophils Relative %: 50 %
Platelets: 39 10*3/uL — ABNORMAL LOW (ref 150–400)
RBC: 3.4 MIL/uL — ABNORMAL LOW (ref 3.87–5.11)
RDW: 17.7 % — ABNORMAL HIGH (ref 11.5–15.5)
WBC: 8.4 10*3/uL (ref 4.0–10.5)
nRBC: 0 % (ref 0.0–0.2)

## 2021-05-12 LAB — RENAL FUNCTION PANEL
Albumin: 2.8 g/dL — ABNORMAL LOW (ref 3.5–5.0)
Anion gap: 12 (ref 5–15)
BUN: 32 mg/dL — ABNORMAL HIGH (ref 8–23)
CO2: 27 mmol/L (ref 22–32)
Calcium: 8.2 mg/dL — ABNORMAL LOW (ref 8.9–10.3)
Chloride: 98 mmol/L (ref 98–111)
Creatinine, Ser: 3.21 mg/dL — ABNORMAL HIGH (ref 0.44–1.00)
GFR, Estimated: 15 mL/min — ABNORMAL LOW (ref >60)
Glucose, Bld: 278 mg/dL — ABNORMAL HIGH (ref 70–99)
Phosphorus: 3.6 mg/dL (ref 2.5–4.6)
Potassium: 3.3 mmol/L — ABNORMAL LOW (ref 3.5–5.1)
Sodium: 137 mmol/L (ref 135–145)

## 2021-05-12 LAB — GLUCOSE, CAPILLARY
Glucose-Capillary: 159 mg/dL — ABNORMAL HIGH (ref 70–99)
Glucose-Capillary: 174 mg/dL — ABNORMAL HIGH (ref 70–99)
Glucose-Capillary: 216 mg/dL — ABNORMAL HIGH (ref 70–99)
Glucose-Capillary: 156 mg/dL — ABNORMAL HIGH (ref 70–99)
Glucose-Capillary: 337 mg/dL — ABNORMAL HIGH (ref 70–99)

## 2021-05-12 LAB — C-REACTIVE PROTEIN: CRP: 4.5 mg/dL — ABNORMAL HIGH (ref <1.0)

## 2021-05-12 LAB — D-DIMER, QUANTITATIVE: D-Dimer, Quant: 1.99 ug/mL-FEU — ABNORMAL HIGH (ref 0.00–0.50)

## 2021-05-12 LAB — FERRITIN: Ferritin: 4126 ng/mL — ABNORMAL HIGH (ref 11–307)

## 2021-05-12 LAB — MAGNESIUM: Magnesium: 1.9 mg/dL (ref 1.7–2.4)

## 2021-05-12 MED ORDER — ACETAMINOPHEN 325 MG PO TABS
650.0000 mg | ORAL_TABLET | Freq: Four times a day (QID) | ORAL | Status: DC | PRN
  Administered 2021-05-13 – 2021-05-18 (×4): 650 mg via ORAL
  Filled 2021-05-12 (×4): qty 2

## 2021-05-12 MED ORDER — OXYCODONE HCL 5 MG PO TABS
5.0000 mg | ORAL_TABLET | ORAL | Status: DC | PRN
Start: 2021-05-12 — End: 2021-05-19
  Administered 2021-05-12 – 2021-05-17 (×10): 5 mg via ORAL
  Filled 2021-05-12 (×10): qty 1

## 2021-05-12 MED ORDER — HYDRALAZINE HCL 50 MG PO TABS
50.0000 mg | ORAL_TABLET | Freq: Three times a day (TID) | ORAL | Status: DC
  Administered 2021-05-12 – 2021-05-19 (×16): 50 mg via ORAL
  Filled 2021-05-12 (×19): qty 1

## 2021-05-12 MED ORDER — POTASSIUM CHLORIDE CRYS ER 20 MEQ PO TBCR
40.0000 meq | EXTENDED_RELEASE_TABLET | Freq: Once | ORAL | Status: AC
  Administered 2021-05-12: 40 meq via ORAL
  Filled 2021-05-12: qty 2

## 2021-05-12 NOTE — Progress Notes (Signed)
Physical Therapy Treatment Patient Details Name: Christine Gibson MRN: [id number] DOB: September 13, 1951 Today's Date: 05/12/2021    History of Present Illness 70 y.o. F admitted to [facility] due to R sided weakness and difficulty ambulating. MRI on 7/20 shows small acute L thalamocapsular infarct. COVID +. Prior medical history significant for ESRD on HD on TTS, CVA, CLL, thrombocytopenia, anemia of chronic disease. Pt reports that she is on Hospice, confirmed with husband.    PT Comments    Pt was assisted to stand up with RW and get to the chair, despite her struggle with communication and sequencing.  Pt is motivated to try to get moving.  Very fatigued from HD, but despite pain from her surgery, was willing to sit in the chair a short time to get stronger.  Followed up with nursing to let them know that pt had her bed ready to get back when she is tired of sitting up.  Poor intake with her lunch and declined to eat more when up to recliner.  Follow for acute PT goals.   Follow Up Recommendations  SNF     Equipment Recommendations  3in1 (PT)    Recommendations for Other Services       Precautions / Restrictions Precautions Precautions: Fall Restrictions Weight Bearing Restrictions: No    Mobility  Bed Mobility Overal bed mobility: Needs Assistance Bed Mobility: Supine to Sit     Supine to sit: Min assist     General bed mobility comments: up to side of bed with close guarding    Transfers Overall transfer level: Needs assistance Equipment used: 1 person hand held assist;Rolling walker (2 wheeled) Transfers: Sit to/from Stand Sit to Stand: Min assist            Ambulation/Gait Ambulation/Gait assistance: Min Web designer (Feet): 3 Feet Assistive device: Rolling walker (2 wheeled) Gait Pattern/deviations: Step-to pattern Gait velocity: decreased Gait velocity interpretation: <1.8 ft/sec, indicate of risk for recurrent falls General Gait Details: steps to transfer  to chair   Stairs             Wheelchair Mobility    Modified Rankin (Stroke Patients Only) Modified Rankin (Stroke Patients Only) Pre-Morbid Rankin Score: Moderately severe disability Modified Rankin: Moderately severe disability     Balance Overall balance assessment: History of Falls;Needs assistance Sitting-balance support: Single extremity supported;Feet supported Sitting balance-Leahy Scale: Fair Sitting balance - Comments: fair but tends to lean forward on side of bed     Standing balance-Leahy Scale: Poor                              Cognition Arousal/Alertness: Awake/alert Behavior During Therapy: Flat affect Overall Cognitive Status: Difficult to assess Area of Impairment: Orientation;Attention;Following commands;Safety/judgement                 Orientation Level: Time;Situation Current Attention Level: Selective Memory: Decreased short-term memory;Decreased recall of precautions Following Commands: Follows one step commands inconsistently;Follows one step commands with increased time Safety/Judgement: Decreased awareness of deficits Awareness: Anticipatory;Intellectual Problem Solving: Slow processing;Requires tactile cues;Requires verbal cues General Comments: pt is unable to discuss her symptoms of pain and requires help to get positioned for her issues      Exercises      General Comments General comments (skin integrity, edema, etc.): pt is up to side of bed to get moved to chair and tends to require help for seuqencing at every step  Pertinent Vitals/Pain Pain Assessment: Faces Faces Pain Scale: Hurts even more Pain Location: rectal Pain Descriptors / Indicators: Grimacing;Moaning Pain Intervention(s): RN gave pain meds during session;Patient requesting pain meds-RN notified;Limited activity within patient's tolerance;Monitored during session;Repositioned    Home Living                      Prior Function             PT Goals (current goals can now be found in the care plan section) Acute Rehab PT Goals Patient Stated Goal: feel better and get pain meds Progress towards PT goals: Progressing toward goals    Frequency    Min 3X/week      PT Plan Current plan remains appropriate    Co-evaluation              AM-PAC PT "6 Clicks" Mobility   Outcome Measure  Help needed turning from your back to your side while in a flat bed without using bedrails?: None Help needed moving from lying on your back to sitting on the side of a flat bed without using bedrails?: A Little Help needed moving to and from a bed to a chair (including a wheelchair)?: A Little Help needed standing up from a chair using your arms (e.g., wheelchair or bedside chair)?: A Little Help needed to walk in hospital room?: A Little Help needed climbing 3-5 steps with a railing? : A Lot 6 Click Score: 18    End of Session Equipment Utilized During Treatment: Gait belt;Oxygen Activity Tolerance: Treatment limited secondary to medical complications (Comment) Patient left: in chair;with call bell/phone within reach;with chair alarm set;with family/visitor present Nurse Communication: Mobility status;Patient requests pain meds PT Visit Diagnosis: Unsteadiness on feet (R26.81);Difficulty in walking, not elsewhere classified (R26.2)     Time: 4098-1191 PT Time Calculation (min) (ACUTE ONLY): 28 min  Charges:  $Therapeutic Activity: 23-37 mins            Ramond Dial 05/12/2021, 9:37 PM  Mee Hives, PT MS Acute Rehab Dept. Number: [place]

## 2021-05-12 NOTE — TOC Progression Note (Addendum)
Transition of Care [facility]) - Progression Note    Patient Details  Name: Christine Gibson MRN: [id number] Date of Birth: July 04, 1951  Transition of Care [facility]) CM/SW [place], [place] Phone Number: 05/12/2021, 3:02 PM  Clinical Narrative:     CSW was notified that pt had been approved for SNF through the [facility]. The following facilities [facility]:  [place]: Facility # [phone], Admissions # [phone] Voicemail was left. [facility] # [phone] Referral faxed to admissions coordinator, [place] # [phone] Fax# [phone] Referral faxed to Admissions Tina or [facility] # [phone] Fax# [phone] Referral faxed to [facility] in admissions.  SW will continue to follow for transition of care and DC needs.  Expected Discharge Plan: [place] Barriers to Discharge: Continued Medical Work up  Expected Discharge Plan and Services Expected Discharge Plan: [place] In-house Referral: Clinical Social Work Discharge Planning Services: CM Consult Post Acute Care Choice: [place] arrangements for the past 2 months: Single Family Home                                       Social Determinants of Health (SDOH) Interventions    Readmission Risk Interventions No flowsheet data found.

## 2021-05-12 NOTE — Plan of Care (Signed)

## 2021-05-12 NOTE — Progress Notes (Signed)
Patient ID: Stephens Shire, female   DOB: 12-06-1950, 70 y.o.   MRN: [id number] S: No events overnight.   O:BP (!) 161/56 (BP Location: Right Arm)   Pulse 67   Temp 98.2 F (36.8 C) (Oral)   Resp 18   Ht 5' (1.524 m)   Wt 49.9 kg   LMP  (LMP Unknown)   SpO2 100%   BMI 21.48 kg/m   Intake/Output Summary (Last 24 hours) at 05/12/2021 1058 Last data filed at 05/12/2021 0700 Gross per 24 hour  Intake 100 ml  Output 2000 ml  Net -1900 ml   Intake/Output: I/O last 3 completed shifts: In: 100 [IV Piggyback:100] Out: 2000 [Other:2000]  Intake/Output this shift:  No intake/output data recorded. Weight change:  Physical exam: unable to complete due to COVID + status.  In order to preserve PPE equipment and to minimize exposure to providers.  Notes from other caregivers reviewed   Recent Labs  Lab 05/09/21 1138 05/10/21 0205 05/11/21 0122  NA 142 141 139  K 3.9 4.5 4.6  CL 105 105 100  CO2 27 23 25   GLUCOSE 160* 173* 163*  BUN 45* 52* 39*  CREATININE 6.21* 6.66* 4.57*  ALBUMIN  --  2.6* 2.7*  CALCIUM 8.6* 8.3* 8.6*  PHOS  --  5.5* 5.4*   Liver Function Tests: Recent Labs  Lab 05/10/21 0205 05/11/21 0122  ALBUMIN 2.6* 2.7*   No results for input(s): LIPASE, AMYLASE in the last 168 hours. No results for input(s): AMMONIA in the last 168 hours. CBC: Recent Labs  Lab 05/09/21 1138 05/09/21 1309 05/09/21 1956 05/10/21 0205 05/11/21 0122  WBC 7.6 7.8 8.5 8.5 6.5  NEUTROABS  --  3.9  --  4.6 3.2  HGB 8.1* 8.4* 8.6* 8.2* 9.9*  HCT 25.9* 27.1* 27.0* 26.4* 30.9*  MCV 87.8 87.4 87.4 87.7 84.9  PLT 24* 26* 25* 26* 28*   Cardiac Enzymes: No results for input(s): CKTOTAL, CKMB, CKMBINDEX, TROPONINI in the last 168 hours. CBG: Recent Labs  Lab 05/11/21 0632 05/11/21 1217 05/11/21 1646 05/11/21 2354 05/12/21 0825  GLUCAP 188* 199* 238* 174* 159*    Iron Studies:  Recent Labs    05/11/21 0122  FERRITIN 1,463*   Studies/Results: ECHOCARDIOGRAM COMPLETE  BUBBLE STUDY  Result Date: 05/10/2021    ECHOCARDIOGRAM REPORT   Patient Name:   Stephens Shire Date of Exam: 05/10/2021 Medical Rec #:  [id number]       Height:       60.0 in Accession #:    [id number]      Weight:       110.0 lb Date of Birth:  02-02-1951       BSA:          1.448 m Patient Age:    51 years        BP:           152/84 mmHg Patient Gender: F               HR:           75 bpm. Exam Location:  Inpatient Procedure: 2D Echo Indications:    Stroke I63.9  History:        Patient has prior history of Echocardiogram examinations, most                 recent 12/12/2012. Risk Factors:Hypertension, Diabetes and Covid  Positive.  Sonographer:    Mikki Santee RDCS (AE) Referring Phys: [id number] [place]  1. Left ventricular ejection fraction, by estimation, is 50%. The left ventricle has mildly decreased function. The left ventricle demonstrates global hypokinesis. There is mild left ventricular hypertrophy. Left ventricular diastolic parameters are consistent with Grade I diastolic dysfunction (impaired relaxation).  2. Right ventricular systolic function is normal. The right ventricular size is normal. There is normal pulmonary artery systolic pressure. The estimated right ventricular systolic pressure is 70.6 mmHg.  3. Left atrial size was mildly dilated.  4. The mitral valve is normal in structure. Mild mitral valve regurgitation. No evidence of mitral stenosis.  5. The aortic valve is tricuspid. Aortic valve regurgitation is not visualized. Mild aortic valve sclerosis is present, with no evidence of aortic valve stenosis.  6. The inferior vena cava is normal in size with greater than 50% respiratory variability, suggesting right atrial pressure of 3 mmHg.  7. No PFO or ASD by bubble study. FINDINGS  Left Ventricle: Left ventricular ejection fraction, by estimation, is 50%. The left ventricle has mildly decreased function. The left ventricle demonstrates global  hypokinesis. The left ventricular internal cavity size was normal in size. There is mild left ventricular hypertrophy. Left ventricular diastolic parameters are consistent with Grade I diastolic dysfunction (impaired relaxation). Right Ventricle: The right ventricular size is normal. No increase in right ventricular wall thickness. Right ventricular systolic function is normal. There is normal pulmonary artery systolic pressure. The tricuspid regurgitant velocity is 2.67 m/s, and  with an assumed right atrial pressure of 3 mmHg, the estimated right ventricular systolic pressure is 23.7 mmHg. Left Atrium: Left atrial size was mildly dilated. Right Atrium: Right atrial size was normal in size. Pericardium: There is no evidence of pericardial effusion. Mitral Valve: The mitral valve is normal in structure. There is mild calcification of the mitral valve leaflet(s). Mild mitral annular calcification. Mild mitral valve regurgitation. No evidence of mitral valve stenosis. Tricuspid Valve: The tricuspid valve is normal in structure. Tricuspid valve regurgitation is mild. Aortic Valve: The aortic valve is tricuspid. Aortic valve regurgitation is not visualized. Mild aortic valve sclerosis is present, with no evidence of aortic valve stenosis. Pulmonic Valve: The pulmonic valve was normal in structure. Pulmonic valve regurgitation is not visualized. Aorta: The aortic root is normal in size and structure. Venous: The inferior vena cava is normal in size with greater than 50% respiratory variability, suggesting right atrial pressure of 3 mmHg. IAS/Shunts: No PFO or ASD by bubble study.  LEFT VENTRICLE PLAX 2D LVIDd:         4.10 cm      Diastology LVIDs:         3.20 cm      LV e' medial:    3.59 cm/s LV PW:         1.00 cm      LV E/e' medial:  27.3 LV IVS:        1.10 cm      LV e' lateral:   4.79 cm/s LVOT diam:     2.00 cm      LV E/e' lateral: 20.4 LV SV:         66 LV SV Index:   46 LVOT Area:     3.14 cm  LV Volumes  (MOD) LV vol d, MOD A4C: 101.0 ml LV vol s, MOD A4C: 47.5 ml LV SV MOD A4C:     101.0 ml RIGHT VENTRICLE RV  Basal diam:  2.60 cm RV S prime:     8.16 cm/s TAPSE (M-mode): 1.4 cm LEFT ATRIUM             Index       RIGHT ATRIUM           Index LA diam:        3.30 cm 2.28 cm/m  RA Area:     13.40 cm LA Vol (A2C):   55.9 ml 38.61 ml/m RA Volume:   32.10 ml  22.17 ml/m LA Vol (A4C):   50.6 ml 34.95 ml/m LA Biplane Vol: 56.4 ml 38.95 ml/m  AORTIC VALVE LVOT Vmax:   100.00 cm/s LVOT Vmean:  58.700 cm/s LVOT VTI:    0.210 m  AORTA Ao Root diam: 2.30 cm MITRAL VALVE                TRICUSPID VALVE MV Area (PHT): 4.39 cm     TR Peak grad:   28.5 mmHg MV Decel Time: 173 msec     TR Vmax:        267.00 cm/s MV E velocity: 97.90 cm/s MV A velocity: 142.00 cm/s  SHUNTS MV E/A ratio:  0.69         Systemic VTI:  0.21 m                             Systemic Diam: 2.00 cm Loralie Champagne MD Electronically signed by Loralie Champagne MD Signature Date/Time: 05/10/2021/4:40:38 PM    Final     (feeding supplement) PROSource Plus  30 mL Oral BID BM   vitamin C  500 mg Oral Daily   atorvastatin  40 mg Oral Daily   carvedilol  25 mg Oral BID WC   doxercalciferol  2 mcg Intravenous Q T,Th,Sa-HD   hydrALAZINE  25 mg Oral TID   insulin aspart  0-5 Units Subcutaneous QHS   insulin aspart  0-9 Units Subcutaneous TID WC   Ipratropium-Albuterol  1 puff Inhalation Q6H   losartan  100 mg Oral Daily   methylPREDNISolone (SOLU-MEDROL) injection  25 mg Intravenous Q12H   Followed by   Derrill Memo ON 05/13/2021] predniSONE  50 mg Oral Daily   multivitamin with minerals  1 tablet Oral Daily   Zanubrutinib  2 capsule Oral Daily   zinc sulfate  220 mg Oral Daily    BMET    Component Value Date/Time   NA 139 05/11/2021 0122   K 4.6 05/11/2021 0122   CL 100 05/11/2021 0122   CO2 25 05/11/2021 0122   GLUCOSE 163 (H) 05/11/2021 0122   BUN 39 (H) 05/11/2021 0122   CREATININE 4.57 (H) 05/11/2021 0122   CREATININE 4.72 (H) 01/06/2021 1615    CALCIUM 8.6 (L) 05/11/2021 0122   GFRNONAA 10 (L) 05/11/2021 0122   GFRAA 12 (L) 01/12/2019 0908   CBC    Component Value Date/Time   WBC 6.5 05/11/2021 0122   RBC 3.64 (L) 05/11/2021 0122   HGB 9.9 (L) 05/11/2021 0122   HCT 30.9 (L) 05/11/2021 0122   PLT 28 (LL) 05/11/2021 0122   MCV 84.9 05/11/2021 0122   MCH 27.2 05/11/2021 0122   MCHC 32.0 05/11/2021 0122   RDW 17.5 (H) 05/11/2021 0122   LYMPHSABS 2.5 05/11/2021 0122   MONOABS 0.6 05/11/2021 0122   EOSABS 0.1 05/11/2021 0122   BASOSABS 0.0 05/11/2021 0122    Dialysis Orders:  TTS [place] 3:30, 450/500,  EDW 44.5kg, 3K/2.5Ca, AVG, heparin 3000 - Hectoral 19mcg IV q HD - Mircera 157mcg IV q 2 weeks (last 7/9)   Assessment/Plan:  New acute L thalamocapsular infarct: Work-up per neuro.  COVID positive: with new hypoxia and CXR infiltrate - started on IV solu-medrol and remdesivir. Will need isolation HD per clinic policies.  ESRD:  Continue HD per TTS schedule  Hypertension/volume: BP slightly high - permissive HTN no longer needed per Neurology.  Will UF as tolerated as she is above her edw.  Anemia: Hgb 9.9 - not due for ESA yet.  Metabolic bone disease: Ca/Phos ok, continue home binders and VDRA.  Nutrition:  Alb low, will add supplements.  CLL/thrombocytopenia - platelets up slightly to 28, no heparin with HD.  T2DM Disposition - pt with multiple irreversible/end stage disease processes and now with new stroke.  She is a full code here but is under hospice services as an outpatient.  Need to change code status.  Donetta Potts, MD [employer] [phone]

## 2021-05-12 NOTE — Progress Notes (Addendum)
PROGRESS NOTE    Christine Gibson  DXI:[id number] DOB: 03-25-1951 DOA: 05/09/2021 PCP: Jearld Fenton, NP   Brief Narrative:  Christine Gibson is a 70 y.o. female with medical history significant for ESRD on HD on TTS, CVA, CLL, thrombocytopenia, chronic hypoxic respiratory failure requiring 2 L of oxygen, anemia of chronic disease, who presented to [facility] ED with complaints of R sided weakness and difficulty ambulating x 36 hours.  She was hemodynamically stable upon presentation to ED.  CT head was abnormal, revealing new 1.6 cm amorphous area of hyperdensity within the right cerebellar hemisphere.  MRI brain revealed small acute left thalamocapsular infarct.  Faint edema without mass-effect.  Seen by neurology who recommended hospitalist admission for stroke work-up.   Assessment & Plan:   Active Problems:   Acute CVA (cerebrovascular accident) ([place])  Acute CVA, POA: MRI brain showed small acute left thalamocapsular infarct without mass-effect. MRA head and neck negative for CVO.  Echo shows 50% ejection fraction, grade 1 diastolic dysfunction but no ASD.  SLP cleared for diet.  PT OT recommends SNF.  Neurology cleared for discharge with no antiplatelet due to severe thrombocytopenia.  Blood pressure slightly elevated this morning again but it has been very labile.  Will increase hydralazine to 50 mg 3 times daily and continue rest of the antihypertensives.  ESRD on HD TTS: Nephrology managing.  Prediabetes: Hemoglobin A1c 6.0.  Diet controlled prior to admission.  Currently slightly hyperglycemic due to being on dexamethasone.  Continue SSI.   Acute hypoxic respiratory failure secondary to acute combined systolic and diastolic CHF: Echo shows 76% ejection fraction and grade 1 diastolic dysfunction. Elevated BNP greater than 3000.  Chest x-ray also shows pulmonary edema.  Patient on room air now.   COVID-19 viral infection Self-reported positive COVID-19 screening test 03/15/2021 at outside  facility Positive COVID test on 05/09/2021.  I personally do not see any infiltrates on the chest x-rayHowever she has elevated inflammatory markers.  Completed 3 doses of remdesivir on 05/11/2021.  Chronic hypoxic respiratory failure: Patient uses 2 L of oxygen at home and she is using 2 L here.  It is unknown to me why she has been using oxygen at home.  Patient and husband both unaware of that.   Hyperlipidemia: Continue Lipitor.   CLL/chronic thrombocytopenia: Stable.  No signs of bleeding.  Per neurology, not a candidate for any antiplatelet.  Resume home medications.   Anemia of chronic disease: Hemoglobin at baseline.  Monitor.  Hypokalemia: We will replace.  DVT prophylaxis: SCDs Start: 05/09/21 1935   Code Status: Full Code double checked with patient and husband today. Family Communication: None present at bedside.  Plan of care discussed with patient and then with her husband over the phone.  He is agreeable with the plan to discharge to SNF.  Status is: Inpatient  Remains inpatient appropriate because: Unsafe DC plan  Dispo:  Patient From: Home  Planned Disposition: [place]  Medically stable for discharge: yes      Estimated body mass index is 21.48 kg/m as calculated from the following:   Height as of this encounter: 5' (1.524 m).   Weight as of this encounter: 49.9 kg.      Nutritional status:               Consultants:  Neurology Nephrology  Procedures:  NONE  Antimicrobials:  Anti-infectives (From admission, onward)    Start     Dose/Rate Route Frequency Ordered Stop  05/11/21 1000  remdesivir 100 mg in sodium chloride 0.9 % 100 mL IVPB  Status:  Discontinued       See Hyperspace for full Linked Orders Report.   100 mg 200 mL/hr over 30 Minutes Intravenous Daily 05/09/21 2326 05/10/21 1611   05/11/21 1000  remdesivir 100 mg in sodium chloride 0.9 % 100 mL IVPB       See Hyperspace for full Linked Orders Report.   100  mg 200 mL/hr over 30 Minutes Intravenous Daily 05/10/21 1611 05/12/21 0937   05/10/21 0030  remdesivir 200 mg in sodium chloride 0.9% 250 mL IVPB       See Hyperspace for full Linked Orders Report.   200 mg 580 mL/hr over 30 Minutes Intravenous Once 05/09/21 2326 05/10/21 0105          Subjective: Seen and examined.  She has no complaints.  Objective: Vitals:   05/12/21 0600 05/12/21 0630 05/12/21 0700 05/12/21 0825  BP: (!) 96/55 124/60 (!) 172/65 (!) 161/56  Pulse: 83 74 73 67  Resp: (!) 22 20 19 18   Temp:   98.8 F (37.1 C) 98.2 F (36.8 C)  TempSrc:    Oral  SpO2: 99% 100% 99% 100%  Weight:      Height:        Intake/Output Summary (Last 24 hours) at 05/12/2021 1134 Last data filed at 05/12/2021 0700 Gross per 24 hour  Intake 100 ml  Output 2000 ml  Net -1900 ml    Filed Weights   05/09/21 1106  Weight: 49.9 kg    Examination:  General exam: Appears calm and comfortable  Respiratory system: Clear to auscultation. Respiratory effort normal. Cardiovascular system: S1 & S2 heard, RRR. No JVD, murmurs, rubs, gallops or clicks. No pedal edema. Gastrointestinal system: Abdomen is nondistended, soft and nontender. No organomegaly or masses felt. Normal bowel sounds heard. Central nervous system: Alert and oriented. No focal neurological deficits. Extremities: Symmetric 5 x 5 power. Skin: No rashes, lesions or ulcers.  Psychiatry: Judgement and insight appear poor, mood & affect appropriate.   Data Reviewed: I have personally reviewed following labs and imaging studies  CBC: Recent Labs  Lab 05/09/21 1309 05/09/21 1956 05/10/21 0205 05/11/21 0122 05/12/21 1036  WBC 7.8 8.5 8.5 6.5 8.4  NEUTROABS 3.9  --  4.6 3.2 PENDING  HGB 8.4* 8.6* 8.2* 9.9* 9.4*  HCT 27.1* 27.0* 26.4* 30.9* 28.1*  MCV 87.4 87.4 87.7 84.9 82.6  PLT 26* 25* 26* 28* 39*    Basic Metabolic Panel: Recent Labs  Lab 05/09/21 1138 05/10/21 0205 05/11/21 0122  NA 142 141 139  K 3.9  4.5 4.6  CL 105 105 100  CO2 27 23 25   GLUCOSE 160* 173* 163*  BUN 45* 52* 39*  CREATININE 6.21* 6.66* 4.57*  CALCIUM 8.6* 8.3* 8.6*  MG  --  1.8 2.0  PHOS  --  5.5* 5.4*    GFR: Estimated Creatinine Clearance: 8.2 mL/min (A) (by C-G formula based on SCr of 4.57 mg/dL (H)). Liver Function Tests: Recent Labs  Lab 05/10/21 0205 05/11/21 0122  ALBUMIN 2.6* 2.7*    No results for input(s): LIPASE, AMYLASE in the last 168 hours. No results for input(s): AMMONIA in the last 168 hours. Coagulation Profile: No results for input(s): INR, PROTIME in the last 168 hours. Cardiac Enzymes: No results for input(s): CKTOTAL, CKMB, CKMBINDEX, TROPONINI in the last 168 hours. BNP (last 3 results) No results for input(s): PROBNP in the last 8760  hours. HbA1C: Recent Labs    05/10/21 0205  HGBA1C 6.0*    CBG: Recent Labs  Lab 05/11/21 0632 05/11/21 1217 05/11/21 1646 05/11/21 2354 05/12/21 0825  GLUCAP 188* 199* 238* 174* 159*    Lipid Profile: Recent Labs    05/10/21 0205  CHOL 112  HDL 18*  LDLCALC 67  TRIG 136  CHOLHDL 6.2  LDLDIRECT 58.4    Thyroid Function Tests: No results for input(s): TSH, T4TOTAL, FREET4, T3FREE, THYROIDAB in the last 72 hours. Anemia Panel: Recent Labs    05/10/21 0205 05/11/21 0122  FERRITIN 1,351* 1,463*    Sepsis Labs: No results for input(s): PROCALCITON, LATICACIDVEN in the last 168 hours.  Recent Results (from the past 240 hour(s))  Resp Panel by RT-PCR (Flu A&B, Covid) Nasopharyngeal Swab     Status: Abnormal   Collection Time: 05/09/21  4:10 PM   Specimen: Nasopharyngeal Swab; Nasopharyngeal(NP) swabs in vial transport medium  Result Value Ref Range Status   SARS Coronavirus 2 by RT PCR POSITIVE (A) NEGATIVE Final    Comment: RESULT CALLED TO, READ BACK BY AND VERIFIED WITH: A BANKS RN [id number] 05/09/21 A BROWNING (NOTE) SARS-CoV-2 target nucleic acids are DETECTED.  The SARS-CoV-2 RNA is generally detectable in upper  respiratory specimens during the acute phase of infection. Positive results are indicative of the presence of the identified virus, but do not rule out bacterial infection or co-infection with other pathogens not detected by the test. Clinical correlation with patient history and other diagnostic information is necessary to determine patient infection status. The expected result is Negative.  Fact Sheet for Patients: EntrepreneurPulse.com.au  Fact Sheet for Healthcare Providers: IncredibleEmployment.be  This test is not yet approved or cleared by the [place] FDA and  has been authorized for detection and/or diagnosis of SARS-CoV-2 by FDA under an Emergency Use Authorization (EUA).  This EUA will remain in effect (meaning this test can b e used) for the duration of  the COVID-19 declaration under Section 564(b)(1) of the Act, 21 U.S.C. section 360bbb-3(b)(1), unless the authorization is terminated or revoked sooner.     Influenza A by PCR NEGATIVE NEGATIVE Final   Influenza B by PCR NEGATIVE NEGATIVE Final    Comment: (NOTE) The Xpert Xpress SARS-CoV-2/FLU/RSV plus assay is intended as an aid in the diagnosis of influenza from Nasopharyngeal swab specimens and should not be used as a sole basis for treatment. Nasal washings and aspirates are unacceptable for Xpert Xpress SARS-CoV-2/FLU/RSV testing.  Fact Sheet for Patients: EntrepreneurPulse.com.au  Fact Sheet for Healthcare Providers: IncredibleEmployment.be  This test is not yet approved or cleared by the [place] FDA and has been authorized for detection and/or diagnosis of SARS-CoV-2 by FDA under an Emergency Use Authorization (EUA). This EUA will remain in effect (meaning this test can be used) for the duration of the COVID-19 declaration under Section 564(b)(1) of the Act, 21 U.S.C. section 360bbb-3(b)(1), unless the authorization is  terminated or revoked.  Performed at [facility], [place] [address], [place], [place] [address]   C Difficile Quick Screen w PCR reflex     Status: None   Collection Time: 05/11/21  3:20 PM   Specimen: STOOL  Result Value Ref Range Status   C Diff antigen NEGATIVE NEGATIVE Final   C Diff toxin NEGATIVE NEGATIVE Final   C Diff interpretation No C. difficile detected.  Final    Comment: Performed at [facility], [place] [address] [place], [place] [address]  Radiology Studies: ECHOCARDIOGRAM COMPLETE BUBBLE STUDY  Result Date: 05/10/2021    ECHOCARDIOGRAM REPORT   Patient Name:   Christine Gibson Date of Exam: 05/10/2021 Medical Rec #:  [id number]       Height:       60.0 in Accession #:    [id number]      Weight:       110.0 lb Date of Birth:  July 31, 1951       BSA:          1.448 m Patient Age:    36 years        BP:           152/84 mmHg Patient Gender: F               HR:           75 bpm. Exam Location:  Inpatient Procedure: 2D Echo Indications:    Stroke I63.9  History:        Patient has prior history of Echocardiogram examinations, most                 recent 12/12/2012. Risk Factors:Hypertension, Diabetes and Covid                 Positive.  Sonographer:    Mikki Santee RDCS (AE) Referring Phys: [id number] [place]  1. Left ventricular ejection fraction, by estimation, is 50%. The left ventricle has mildly decreased function. The left ventricle demonstrates global hypokinesis. There is mild left ventricular hypertrophy. Left ventricular diastolic parameters are consistent with Grade I diastolic dysfunction (impaired relaxation).  2. Right ventricular systolic function is normal. The right ventricular size is normal. There is normal pulmonary artery systolic pressure. The estimated right ventricular systolic pressure is 18.8 mmHg.  3. Left atrial size was mildly dilated.  4. The mitral valve is normal in structure. Mild mitral valve regurgitation. No evidence of  mitral stenosis.  5. The aortic valve is tricuspid. Aortic valve regurgitation is not visualized. Mild aortic valve sclerosis is present, with no evidence of aortic valve stenosis.  6. The inferior vena cava is normal in size with greater than 50% respiratory variability, suggesting right atrial pressure of 3 mmHg.  7. No PFO or ASD by bubble study. FINDINGS  Left Ventricle: Left ventricular ejection fraction, by estimation, is 50%. The left ventricle has mildly decreased function. The left ventricle demonstrates global hypokinesis. The left ventricular internal cavity size was normal in size. There is mild left ventricular hypertrophy. Left ventricular diastolic parameters are consistent with Grade I diastolic dysfunction (impaired relaxation). Right Ventricle: The right ventricular size is normal. No increase in right ventricular wall thickness. Right ventricular systolic function is normal. There is normal pulmonary artery systolic pressure. The tricuspid regurgitant velocity is 2.67 m/s, and  with an assumed right atrial pressure of 3 mmHg, the estimated right ventricular systolic pressure is 41.6 mmHg. Left Atrium: Left atrial size was mildly dilated. Right Atrium: Right atrial size was normal in size. Pericardium: There is no evidence of pericardial effusion. Mitral Valve: The mitral valve is normal in structure. There is mild calcification of the mitral valve leaflet(s). Mild mitral annular calcification. Mild mitral valve regurgitation. No evidence of mitral valve stenosis. Tricuspid Valve: The tricuspid valve is normal in structure. Tricuspid valve regurgitation is mild. Aortic Valve: The aortic valve is tricuspid. Aortic valve regurgitation is not visualized. Mild aortic valve sclerosis is present, with no evidence of aortic valve stenosis. Pulmonic Valve: The  pulmonic valve was normal in structure. Pulmonic valve regurgitation is not visualized. Aorta: The aortic root is normal in size and structure.  Venous: The inferior vena cava is normal in size with greater than 50% respiratory variability, suggesting right atrial pressure of 3 mmHg. IAS/Shunts: No PFO or ASD by bubble study.  LEFT VENTRICLE PLAX 2D LVIDd:         4.10 cm      Diastology LVIDs:         3.20 cm      LV e' medial:    3.59 cm/s LV PW:         1.00 cm      LV E/e' medial:  27.3 LV IVS:        1.10 cm      LV e' lateral:   4.79 cm/s LVOT diam:     2.00 cm      LV E/e' lateral: 20.4 LV SV:         66 LV SV Index:   46 LVOT Area:     3.14 cm  LV Volumes (MOD) LV vol d, MOD A4C: 101.0 ml LV vol s, MOD A4C: 47.5 ml LV SV MOD A4C:     101.0 ml RIGHT VENTRICLE RV Basal diam:  2.60 cm RV S prime:     8.16 cm/s TAPSE (M-mode): 1.4 cm LEFT ATRIUM             Index       RIGHT ATRIUM           Index LA diam:        3.30 cm 2.28 cm/m  RA Area:     13.40 cm LA Vol (A2C):   55.9 ml 38.61 ml/m RA Volume:   32.10 ml  22.17 ml/m LA Vol (A4C):   50.6 ml 34.95 ml/m LA Biplane Vol: 56.4 ml 38.95 ml/m  AORTIC VALVE LVOT Vmax:   100.00 cm/s LVOT Vmean:  58.700 cm/s LVOT VTI:    0.210 m  AORTA Ao Root diam: 2.30 cm MITRAL VALVE                TRICUSPID VALVE MV Area (PHT): 4.39 cm     TR Peak grad:   28.5 mmHg MV Decel Time: 173 msec     TR Vmax:        267.00 cm/s MV E velocity: 97.90 cm/s MV A velocity: 142.00 cm/s  SHUNTS MV E/A ratio:  0.69         Systemic VTI:  0.21 m                             Systemic Diam: 2.00 cm Loralie Champagne MD Electronically signed by Loralie Champagne MD Signature Date/Time: 05/10/2021/4:40:38 PM    Final     Scheduled Meds:  (feeding supplement) PROSource Plus  30 mL Oral BID BM   vitamin C  500 mg Oral Daily   atorvastatin  40 mg Oral Daily   carvedilol  25 mg Oral BID WC   doxercalciferol  2 mcg Intravenous Q T,Th,Sa-HD   hydrALAZINE  25 mg Oral TID   insulin aspart  0-5 Units Subcutaneous QHS   insulin aspart  0-9 Units Subcutaneous TID WC   Ipratropium-Albuterol  1 puff Inhalation Q6H   losartan  100 mg Oral Daily    methylPREDNISolone (SOLU-MEDROL) injection  25 mg Intravenous Q12H   Followed by   Derrill Memo  ON 05/13/2021] predniSONE  50 mg Oral Daily   multivitamin with minerals  1 tablet Oral Daily   Zanubrutinib  2 capsule Oral Daily   zinc sulfate  220 mg Oral Daily   Continuous Infusions:     LOS: 3 days   Time spent: 28 min   Darliss Cheney, MD [facility]  05/12/2021, 11:34 AM   How to contact the [facility] Attending or Consulting provider [place] or covering provider during after hours [place], for this patient?  Check the care team in [facility] and look for a) attending/consulting [facility] provider listed and b) the [facility] team listed. Page or secure chat [phone]. Log into [email or web address] and use [place]'s universal password to access. If you do not have the password, please contact the hospital operator. Locate the [facility] provider you are looking for under [facility] and page to a number that you can be directly reached. If you still have difficulty reaching the provider, please page the [facility] (Director on Call) for the Hospitalists listed on amion for assistance.

## 2021-05-13 DIAGNOSIS — U071 COVID-19: Secondary | ICD-10-CM | POA: Diagnosis present

## 2021-05-13 DIAGNOSIS — N186 End stage renal disease: Secondary | ICD-10-CM

## 2021-05-13 DIAGNOSIS — D696 Thrombocytopenia, unspecified: Secondary | ICD-10-CM | POA: Diagnosis not present

## 2021-05-13 LAB — CBC WITH DIFFERENTIAL/PLATELET
Abs Immature Granulocytes: 1.2 10*3/uL — ABNORMAL HIGH (ref 0.00–0.07)
Abs Immature Granulocytes: 0.28 10*3/uL — ABNORMAL HIGH (ref 0.00–0.07)
Basophils Absolute: 0.1 10*3/uL (ref 0.0–0.1)
Basophils Absolute: 0 10*3/uL (ref 0.0–0.1)
Basophils Relative: 0 %
Basophils Relative: 0 %
Eosinophils Absolute: 0.2 10*3/uL (ref 0.0–0.5)
Eosinophils Absolute: 0.2 10*3/uL (ref 0.0–0.5)
Eosinophils Relative: 1 %
Eosinophils Relative: 1 %
HCT: 30.2 % — ABNORMAL LOW (ref 36.0–46.0)
HCT: 27.2 % — ABNORMAL LOW (ref 36.0–46.0)
Hemoglobin: 10 g/dL — ABNORMAL LOW (ref 12.0–15.0)
Hemoglobin: 9.1 g/dL — ABNORMAL LOW (ref 12.0–15.0)
Immature Granulocytes: 3 %
Immature Granulocytes: 2 %
Lymphocytes Relative: 53 %
Lymphocytes Relative: 52 %
Lymphs Abs: 20.8 10*3/uL — ABNORMAL HIGH (ref 0.7–4.0)
Lymphs Abs: 7.4 10*3/uL — ABNORMAL HIGH (ref 0.7–4.0)
MCH: 27.4 pg (ref 26.0–34.0)
MCH: 27.3 pg (ref 26.0–34.0)
MCHC: 33.1 g/dL (ref 30.0–36.0)
MCHC: 33.5 g/dL (ref 30.0–36.0)
MCV: 82.7 fL (ref 80.0–100.0)
MCV: 81.7 fL (ref 80.0–100.0)
Monocytes Absolute: 6.5 10*3/uL — ABNORMAL HIGH (ref 0.1–1.0)
Monocytes Absolute: 1.6 10*3/uL — ABNORMAL HIGH (ref 0.1–1.0)
Monocytes Relative: 17 %
Monocytes Relative: 12 %
Neutro Abs: 10 10*3/uL — ABNORMAL HIGH (ref 1.7–7.7)
Neutro Abs: 4.6 10*3/uL (ref 1.7–7.7)
Neutrophils Relative %: 26 %
Neutrophils Relative %: 33 %
Platelets: 58 10*3/uL — ABNORMAL LOW (ref 150–400)
Platelets: 39 10*3/uL — ABNORMAL LOW (ref 150–400)
RBC: 3.65 MIL/uL — ABNORMAL LOW (ref 3.87–5.11)
RBC: 3.33 MIL/uL — ABNORMAL LOW (ref 3.87–5.11)
RDW: 17.9 % — ABNORMAL HIGH (ref 11.5–15.5)
RDW: 17.9 % — ABNORMAL HIGH (ref 11.5–15.5)
WBC: 38.9 10*3/uL — ABNORMAL HIGH (ref 4.0–10.5)
WBC: 14.1 10*3/uL — ABNORMAL HIGH (ref 4.0–10.5)
nRBC: 0.1 % (ref 0.0–0.2)
nRBC: 0 % (ref 0.0–0.2)

## 2021-05-13 LAB — BASIC METABOLIC PANEL
Anion gap: 13 (ref 5–15)
BUN: 61 mg/dL — ABNORMAL HIGH (ref 8–23)
CO2: 25 mmol/L (ref 22–32)
Calcium: 8.4 mg/dL — ABNORMAL LOW (ref 8.9–10.3)
Chloride: 101 mmol/L (ref 98–111)
Creatinine, Ser: 4.57 mg/dL — ABNORMAL HIGH (ref 0.44–1.00)
GFR, Estimated: 10 mL/min — ABNORMAL LOW (ref >60)
Glucose, Bld: 54 mg/dL — ABNORMAL LOW (ref 70–99)
Potassium: 4.3 mmol/L (ref 3.5–5.1)
Sodium: 139 mmol/L (ref 135–145)

## 2021-05-13 LAB — GLUCOSE, CAPILLARY
Glucose-Capillary: 98 mg/dL (ref 70–99)
Glucose-Capillary: 194 mg/dL — ABNORMAL HIGH (ref 70–99)
Glucose-Capillary: 299 mg/dL — ABNORMAL HIGH (ref 70–99)
Glucose-Capillary: 356 mg/dL — ABNORMAL HIGH (ref 70–99)

## 2021-05-13 MED ORDER — SERTRALINE HCL 100 MG PO TABS: 100.0000 mg | ORAL_TABLET | Freq: Every day | ORAL | Status: DC

## 2021-05-13 MED ORDER — ALBUTEROL SULFATE (2.5 MG/3ML) 0.083% IN NEBU: 3.0000 mL | INHALATION_SOLUTION | Freq: Four times a day (QID) | RESPIRATORY_TRACT | Status: DC | PRN

## 2021-05-13 MED ORDER — PREDNISONE 20 MG PO TABS
60.0000 mg | ORAL_TABLET | Freq: Every day | ORAL | Status: DC
  Administered 2021-05-14 – 2021-05-18 (×5): 60 mg via ORAL
  Filled 2021-05-13 (×5): qty 3

## 2021-05-13 MED ORDER — FUROSEMIDE 40 MG PO TABS
80.0000 mg | ORAL_TABLET | Freq: Every day | ORAL | Status: DC
  Administered 2021-05-13 – 2021-05-19 (×7): 80 mg via ORAL
  Filled 2021-05-13 (×7): qty 2

## 2021-05-13 MED ORDER — SERTRALINE HCL 100 MG PO TABS
100.0000 mg | ORAL_TABLET | Freq: Every day | ORAL | Status: DC
  Administered 2021-05-14 – 2021-05-19 (×6): 100 mg via ORAL
  Filled 2021-05-13 (×6): qty 1

## 2021-05-13 MED ORDER — MELATONIN 3 MG PO TABS
3.0000 mg | ORAL_TABLET | Freq: Every day | ORAL | Status: DC
  Administered 2021-05-13 – 2021-05-18 (×6): 3 mg via ORAL
  Filled 2021-05-13 (×6): qty 1

## 2021-05-13 MED ORDER — BENZONATATE 100 MG PO CAPS
100.0000 mg | ORAL_CAPSULE | Freq: Three times a day (TID) | ORAL | Status: DC | PRN
  Administered 2021-05-14: 100 mg via ORAL
  Filled 2021-05-13: qty 1

## 2021-05-13 MED ORDER — CARVEDILOL 12.5 MG PO TABS: 25.0000 mg | ORAL_TABLET | Freq: Two times a day (BID) | ORAL | Status: DC

## 2021-05-13 MED ORDER — NIFEDIPINE ER OSMOTIC RELEASE 90 MG PO TB24
90.0000 mg | ORAL_TABLET | Freq: Every day | ORAL | Status: DC
  Administered 2021-05-14 – 2021-05-19 (×5): 90 mg via ORAL
  Filled 2021-05-13 (×7): qty 1

## 2021-05-13 MED ORDER — ACETAMINOPHEN 325 MG PO TABS: 650.0000 mg | ORAL_TABLET | ORAL | Status: DC | PRN

## 2021-05-13 MED ORDER — GUAIFENESIN 100 MG/5ML PO SOLN: 15.0000 mL | ORAL | Status: DC | PRN

## 2021-05-13 MED ORDER — MECLIZINE HCL 12.5 MG PO TABS: 25.0000 mg | ORAL_TABLET | Freq: Three times a day (TID) | ORAL | Status: DC | PRN

## 2021-05-13 MED ORDER — HYDRALAZINE HCL 25 MG PO TABS: 25.0000 mg | ORAL_TABLET | Freq: Three times a day (TID) | ORAL | Status: DC

## 2021-05-13 MED ORDER — LOSARTAN POTASSIUM 50 MG PO TABS: 100.0000 mg | ORAL_TABLET | Freq: Every day | ORAL | Status: DC

## 2021-05-13 MED ORDER — ATORVASTATIN CALCIUM 40 MG PO TABS: 40.0000 mg | ORAL_TABLET | Freq: Every day | ORAL | Status: DC

## 2021-05-13 MED ORDER — NIFEDIPINE ER OSMOTIC RELEASE 90 MG PO TB24: 90.0000 mg | ORAL_TABLET | Freq: Every day | ORAL | Status: DC

## 2021-05-13 MED ORDER — FUROSEMIDE 40 MG PO TABS: 80.0000 mg | ORAL_TABLET | Freq: Every day | ORAL | Status: DC

## 2021-05-13 NOTE — Progress Notes (Signed)
PROGRESS NOTE    Christine Gibson  GXQ:[id number] DOB: 01-17-1951 DOA: 05/09/2021 PCP: Jearld Fenton, NP   Brief Narrative:  Christine Gibson is a 70 y.o. female with medical history significant for ESRD on HD on TTS, CVA, CLL, thrombocytopenia, chronic hypoxic respiratory failure requiring 2 L of oxygen, anemia of chronic disease, who presented to [facility] ED with complaints of R sided weakness and difficulty ambulating x 36 hours.  She was hemodynamically stable upon presentation to ED.  CT head was abnormal, revealing new 1.6 cm amorphous area of hyperdensity within the right cerebellar hemisphere.  MRI brain revealed small acute left thalamocapsular infarct.  Faint edema without mass-effect.  Seen by neurology who recommended hospitalist admission for stroke work-up.   Assessment & Plan:   Active Problems:   Acute CVA (cerebrovascular accident) ([place])  Acute CVA, POA: MRI brain showed small acute left thalamocapsular infarct without mass-effect. MRA head and neck negative for CVO.  Echo shows 50% ejection fraction, grade 1 diastolic dysfunction but no ASD.  SLP cleared for diet.  PT OT recommends SNF.  Neurology cleared for discharge with no antiplatelet due to severe thrombocytopenia.  Blood pressure labile but controlled this morning.  Continue home medications which includes Coreg 25 mg 3 times daily, increased the dose of hydralazine to 50 mg p.o. twice daily, losartan 100 mg p.o. daily,  ESRD on HD TTS: Nephrology managing.  Prediabetes: Hemoglobin A1c 6.0.  Diet controlled prior to admission.  Currently slightly hyperglycemic due to being on dexamethasone.  Continue SSI.   Chronic hypoxic respiratory failure secondary to acute combined systolic and diastolic CHF: Patient uses 2 L of oxygen at home.  She is on 2 L here.  It is unknown to me why she has been using oxygen at home.  Neither patient nor her husband is able to answer that question.  Echo shows 50% ejection fraction and grade 1  diastolic dysfunction. Elevated BNP greater than 3000.  Chest x-ray also shows pulmonary edema.  Continue hemodialysis.  Resume home dose of Lasix 80 mg p.o. daily.   COVID-19 viral infection Self-reported positive COVID-19 screening test 03/15/2021 at outside facility Positive COVID test on 05/09/2021.  I personally do not see any infiltrates on the chest x-rayHowever she has elevated inflammatory markers.  Completed 3 doses of remdesivir on 05/11/2021.  Continue prednisone.   Hyperlipidemia: Continue Lipitor.   CLL/chronic thrombocytopenia: Stable.  No signs of bleeding.  Per neurology, not a candidate for any antiplatelet.  Resume home medications.   Anemia of chronic disease: Hemoglobin at baseline.  Monitor.  Hypokalemia: Resolved.  Sudden right-sided hearing loss: Per husband and patient, she lost her hearing suddenly on the right side about a week ago.  Likely not secondary to stroke since stroke is only lacunar and on the left side.  Consulted ENT and discussed with Dr. Hayden Rasmussen and requested to see this patient however I was informed that there would be no change in the management even when the patient is seen in the hospital and thus I was recommended to start this patient on prednisone 60 mg p.o. daily for 10 days and for patient to call ENT office on Monday at [phone] to make an appointment.  DVT prophylaxis: SCDs Start: 05/09/21 1935   Code Status: Full Code double checked with patient and husband today. Family Communication: None present at bedside.  Plan of care discussed with patient and then with her husband over the phone.  He is agreeable with the  plan to discharge to SNF.  Status is: Inpatient  Remains inpatient appropriate because: Unsafe DC plan  Dispo:  Patient From: Home  Planned Disposition: [place]  Medically stable for discharge: yes, pending bed availability.      Estimated body mass index is 21.48 kg/m as calculated from the following:    Height as of this encounter: 5' (1.524 m).   Weight as of this encounter: 49.9 kg.      Nutritional status:               Consultants:  Neurology Nephrology  Procedures:  NONE  Antimicrobials:  Anti-infectives (From admission, onward)    Start     Dose/Rate Route Frequency Ordered Stop   05/11/21 1000  remdesivir 100 mg in sodium chloride 0.9 % 100 mL IVPB  Status:  Discontinued       See Hyperspace for full Linked Orders Report.   100 mg 200 mL/hr over 30 Minutes Intravenous Daily 05/09/21 2326 05/10/21 1611   05/11/21 1000  remdesivir 100 mg in sodium chloride 0.9 % 100 mL IVPB       See Hyperspace for full Linked Orders Report.   100 mg 200 mL/hr over 30 Minutes Intravenous Daily 05/10/21 1611 05/12/21 0937   05/10/21 0030  remdesivir 200 mg in sodium chloride 0.9% 250 mL IVPB       See Hyperspace for full Linked Orders Report.   200 mg 580 mL/hr over 30 Minutes Intravenous Once 05/09/21 2326 05/10/21 0105          Subjective: Patient seen and examined.  Fully alert and oriented.  No complaints other than right sided hearing deficit.  Objective: Vitals:   05/12/21 2030 05/13/21 0015 05/13/21 0424 05/13/21 1020  BP: (!) 156/68 (!) 151/64 (!) 174/69 122/61  Pulse: 72 74 65 78  Resp: 20 19 18 20   Temp: 98.5 F (36.9 C) 98.7 F (37.1 C) (!) 97.3 F (36.3 C) 98.2 F (36.8 C)  TempSrc: Oral Oral Oral Oral  SpO2: 100% 100% 100% 100%  Weight:      Height:        Intake/Output Summary (Last 24 hours) at 05/13/2021 1245 Last data filed at 05/12/2021 1810 Gross per 24 hour  Intake 100 ml  Output --  Net 100 ml    Filed Weights   05/09/21 1106  Weight: 49.9 kg    Examination:  General exam: Appears calm and comfortable  Respiratory system: Clear to auscultation. Respiratory effort normal. Cardiovascular system: S1 & S2 heard, RRR. No JVD, murmurs, rubs, gallops or clicks. No pedal edema. Gastrointestinal system: Abdomen is nondistended, soft  and nontender. No organomegaly or masses felt. Normal bowel sounds heard. Central nervous system: Alert and oriented. No focal neurological deficits. Extremities: Symmetric 5 x 5 power. Skin: No rashes, lesions or ulcers.  Psychiatry: Judgement and insight appear normal. Mood & affect appropriate.    Data Reviewed: I have personally reviewed following labs and imaging studies  CBC: Recent Labs  Lab 05/10/21 0205 05/11/21 0122 05/12/21 1036 05/13/21 0349 05/13/21 1059  WBC 8.5 6.5 8.4 38.9* 14.1*  NEUTROABS 4.6 3.2 4.2 10.0* 4.6  HGB 8.2* 9.9* 9.4* 10.0* 9.1*  HCT 26.4* 30.9* 28.1* 30.2* 27.2*  MCV 87.7 84.9 82.6 82.7 81.7  PLT 26* 28* 39* 58* 39*    Basic Metabolic Panel: Recent Labs  Lab 05/09/21 1138 05/10/21 0205 05/11/21 0122 05/12/21 1036 05/13/21 0349  NA 142 141 139 137 139  K 3.9 4.5  4.6 3.3* 4.3  CL 105 105 100 98 101  CO2 27 23 25 27 25   GLUCOSE 160* 173* 163* 278* 54*  BUN 45* 52* 39* 32* 61*  CREATININE 6.21* 6.66* 4.57* 3.21* 4.57*  CALCIUM 8.6* 8.3* 8.6* 8.2* 8.4*  MG  --  1.8 2.0 1.9  --   PHOS  --  5.5* 5.4* 3.6  --     GFR: Estimated Creatinine Clearance: 8.2 mL/min (A) (by C-G formula based on SCr of 4.57 mg/dL (H)). Liver Function Tests: Recent Labs  Lab 05/10/21 0205 05/11/21 0122 05/12/21 1036  ALBUMIN 2.6* 2.7* 2.8*    No results for input(s): LIPASE, AMYLASE in the last 168 hours. No results for input(s): AMMONIA in the last 168 hours. Coagulation Profile: No results for input(s): INR, PROTIME in the last 168 hours. Cardiac Enzymes: No results for input(s): CKTOTAL, CKMB, CKMBINDEX, TROPONINI in the last 168 hours. BNP (last 3 results) No results for input(s): PROBNP in the last 8760 hours. HbA1C: No results for input(s): HGBA1C in the last 72 hours.  CBG: Recent Labs  Lab 05/12/21 0825 05/12/21 1225 05/12/21 1742 05/12/21 2133 05/13/21 0625  GLUCAP 159* 216* 156* 337* 98    Lipid Profile: No results for input(s):  CHOL, HDL, LDLCALC, TRIG, CHOLHDL, LDLDIRECT in the last 72 hours.  Thyroid Function Tests: No results for input(s): TSH, T4TOTAL, FREET4, T3FREE, THYROIDAB in the last 72 hours. Anemia Panel: Recent Labs    05/11/21 0122 05/12/21 1036  FERRITIN 1,463* 4,126*    Sepsis Labs: No results for input(s): PROCALCITON, LATICACIDVEN in the last 168 hours.  Recent Results (from the past 240 hour(s))  Resp Panel by RT-PCR (Flu A&B, Covid) Nasopharyngeal Swab     Status: Abnormal   Collection Time: 05/09/21  4:10 PM   Specimen: Nasopharyngeal Swab; Nasopharyngeal(NP) swabs in vial transport medium  Result Value Ref Range Status   SARS Coronavirus 2 by RT PCR POSITIVE (A) NEGATIVE Final    Comment: RESULT CALLED TO, READ BACK BY AND VERIFIED WITH: A BANKS RN [id number] 05/09/21 A BROWNING (NOTE) SARS-CoV-2 target nucleic acids are DETECTED.  The SARS-CoV-2 RNA is generally detectable in upper respiratory specimens during the acute phase of infection. Positive results are indicative of the presence of the identified virus, but do not rule out bacterial infection or co-infection with other pathogens not detected by the test. Clinical correlation with patient history and other diagnostic information is necessary to determine patient infection status. The expected result is Negative.  Fact Sheet for Patients: EntrepreneurPulse.com.au  Fact Sheet for Healthcare Providers: IncredibleEmployment.be  This test is not yet approved or cleared by the [place] FDA and  has been authorized for detection and/or diagnosis of SARS-CoV-2 by FDA under an Emergency Use Authorization (EUA).  This EUA will remain in effect (meaning this test can b e used) for the duration of  the COVID-19 declaration under Section 564(b)(1) of the Act, 21 U.S.C. section 360bbb-3(b)(1), unless the authorization is terminated or revoked sooner.     Influenza A by PCR NEGATIVE NEGATIVE  Final   Influenza B by PCR NEGATIVE NEGATIVE Final    Comment: (NOTE) The Xpert Xpress SARS-CoV-2/FLU/RSV plus assay is intended as an aid in the diagnosis of influenza from Nasopharyngeal swab specimens and should not be used as a sole basis for treatment. Nasal washings and aspirates are unacceptable for Xpert Xpress SARS-CoV-2/FLU/RSV testing.  Fact Sheet for Patients: EntrepreneurPulse.com.au  Fact Sheet for Healthcare Providers: IncredibleEmployment.be  This test is  not yet approved or cleared by the [place] and has been authorized for detection and/or diagnosis of SARS-CoV-2 by FDA under an Emergency Use Authorization (EUA). This EUA will remain in effect (meaning this test can be used) for the duration of the COVID-19 declaration under Section 564(b)(1) of the Act, 21 U.S.C. section 360bbb-3(b)(1), unless the authorization is terminated or revoked.  Performed at [facility], [place] [address] [place], [place] [address]   C Difficile Quick Screen w PCR reflex     Status: None   Collection Time: 05/11/21  3:20 PM   Specimen: STOOL  Result Value Ref Range Status   C Diff antigen NEGATIVE NEGATIVE Final   C Diff toxin NEGATIVE NEGATIVE Final   C Diff interpretation No C. difficile detected.  Final    Comment: Performed at [facility], [place] [address] [place], [place] [address]       Radiology Studies: No results found.  Scheduled Meds:  (feeding supplement) PROSource Plus  30 mL Oral BID BM   vitamin C  500 mg Oral Daily   atorvastatin  40 mg Oral Daily   carvedilol  25 mg Oral BID WC   doxercalciferol  2 mcg Intravenous Q T,Th,Sa-HD   hydrALAZINE  50 mg Oral TID   insulin aspart  0-5 Units Subcutaneous QHS   insulin aspart  0-9 Units Subcutaneous TID WC   Ipratropium-Albuterol  1 puff Inhalation Q6H   losartan  100 mg Oral Daily   multivitamin with minerals  1 tablet Oral Daily   predniSONE  50 mg Oral  Daily   Zanubrutinib  2 capsule Oral Daily   zinc sulfate  220 mg Oral Daily   Continuous Infusions:     LOS: 4 days   Time spent: 35 min   Darliss Cheney, MD [facility]  05/13/2021, 12:45 PM   How to contact the [facility] Attending or Consulting provider [place] or covering provider during after hours [place], for this patient?  Check the care team in [facility] and look for a) attending/consulting TRH provider listed and b) the [facility] team listed. Page or secure chat [phone]. Log into [email or web address] and use [place]'s universal password to access. If you do not have the password, please contact the hospital operator. Locate the [facility] provider you are looking for under [facility] and page to a number that you can be directly reached. If you still have difficulty reaching the provider, please page the [facility] (Director on Call) for the Hospitalists listed on amion for assistance.

## 2021-05-13 NOTE — Progress Notes (Signed)
Physical Therapy Treatment Patient Details Name: Christine Gibson MRN: [id number] DOB: 1951-08-03 Today's Date: 05/13/2021    History of Present Illness 70 y.o. F admitted to [facility] due to R sided weakness and difficulty ambulating. MRI on 7/20 shows small acute L thalamocapsular infarct. COVID +. Prior medical history significant for ESRD on HD on TTS, CVA, CLL, thrombocytopenia, anemia of chronic disease. Pt reports that she is on Hospice, confirmed with husband.    PT Comments    Pt is making good progress with mobility, ambulating x2 bouts of ~30-40 ft per bout with a RW and minA this date. However, pt with narrow placement of L foot each step and poor RW management, needing repeated cues to correct. Pt fatigues quickly but maintains SpO2 >/= 91% throughout session on RA this date. Will continue to follow acutely. Current recommendations remain appropriate.    Follow Up Recommendations  SNF     Equipment Recommendations  3in1 (PT)    Recommendations for Other Services       Precautions / Restrictions Precautions Precautions: Fall Precaution Comments: COVID; monitor SpO2 Restrictions Weight Bearing Restrictions: No    Mobility  Bed Mobility               General bed mobility comments: Pt sitting up in recliner upon arrival.    Transfers Overall transfer level: Needs assistance Equipment used: Rolling walker (2 wheeled) Transfers: Sit to/from Stand Sit to Stand: Min guard         General transfer comment: Min guard assist for safety, cuing for hand placement.  Ambulation/Gait Ambulation/Gait assistance: Min assist Gait Distance (Feet): 40 Feet (x2 bouts of ~40 ft > ~30 ft) Assistive device: Rolling walker (2 wheeled) Gait Pattern/deviations: Step-through pattern;Narrow base of support;Scissoring;Decreased stride length;Trunk flexed Gait velocity: decreased Gait velocity interpretation: <1.8 ft/sec, indicate of risk for recurrent falls General Gait Details: Pt  with kyphotic posture, needing repeated cues to remain within RW, fair carryover. Difficulty managing RW with turns. Pt with narrow placement of L foot, almost scissoring, needing repeated verbal and tactile cues of therapist foot between her feet to maintain wider stance. Pt fatigues with SpO2 >/= 91% on RA throughout.   Stairs             Wheelchair Mobility    Modified Rankin (Stroke Patients Only) Modified Rankin (Stroke Patients Only) Pre-Morbid Rankin Score: Moderately severe disability Modified Rankin: Moderately severe disability     Balance Overall balance assessment: History of Falls;Needs assistance         Standing balance support: Bilateral upper extremity supported;During functional activity Standing balance-Leahy Scale: Poor Standing balance comment: Bil UE support for stability with minA.                            Cognition Arousal/Alertness: Awake/alert Behavior During Therapy: WFL for tasks assessed/performed Overall Cognitive Status: Impaired/Different from baseline Area of Impairment: Attention;Following commands;Safety/judgement;Memory;Awareness;Problem solving                   Current Attention Level: Sustained Memory: Decreased short-term memory;Decreased recall of precautions Following Commands: Follows one step commands inconsistently;Follows one step commands with increased time Safety/Judgement: Decreased awareness of deficits;Decreased awareness of safety Awareness: Emergent Problem Solving: Slow processing;Requires tactile cues;Requires verbal cues;Difficulty sequencing General Comments: Pt with difficulty verbalizing initially but spontaneously stating sentences quickly in regards to questions about her needs at end of session. Pt with poor memory of repeated cues for placement  within RW and difficulty multi-tasking 2 cues at a time to follow.      Exercises General Exercises - Lower Extremity Hip ABduction/ADduction:  Strengthening;Both;10 reps;Seated (isometric hip abduction against manual resistance from PT)    General Comments General comments (skin integrity, edema, etc.): SpO2 >/= 91% on RA throughout session, needing several minutes to rebound back to >/= 97% once sitting after gait bout; donned [place] at 1.5L end of session      Pertinent Vitals/Pain Pain Assessment: No/denies pain    Home Living                      Prior Function            PT Goals (current goals can now be found in the care plan section) Acute Rehab PT Goals Patient Stated Goal: to walk PT Goal Formulation: With patient Time For Goal Achievement: 05/24/21 Potential to Achieve Goals: Good Progress towards PT goals: Progressing toward goals    Frequency    Min 3X/week      PT Plan Current plan remains appropriate    Co-evaluation              AM-PAC PT "6 Clicks" Mobility   Outcome Measure  Help needed turning from your back to your side while in a flat bed without using bedrails?: None Help needed moving from lying on your back to sitting on the side of a flat bed without using bedrails?: A Little Help needed moving to and from a bed to a chair (including a wheelchair)?: A Little Help needed standing up from a chair using your arms (e.g., wheelchair or bedside chair)?: A Little Help needed to walk in hospital room?: A Little Help needed climbing 3-5 steps with a railing? : A Lot 6 Click Score: 18    End of Session Equipment Utilized During Treatment: Gait belt;Oxygen Activity Tolerance: Patient limited by fatigue Patient left: in chair;with call bell/phone within reach;with chair alarm set   PT Visit Diagnosis: Unsteadiness on feet (R26.81);Difficulty in walking, not elsewhere classified (R26.2);Other abnormalities of gait and mobility (R26.89)     Time: 1728-1750 PT Time Calculation (min) (ACUTE ONLY): 22 min  Charges:  $Gait Training: 8-22 mins                     Moishe Spice,  PT, DPT Acute Rehabilitation Services  Pager: [phone] Office: [place] 05/13/2021, 6:08 PM

## 2021-05-13 NOTE — Progress Notes (Signed)
Patient ID: Christine Gibson, female   DOB: [birth year], 70 y.o.   MRN: [id number] S:Patient not seen directly today given COVID-19 + status, utilizing data taken from chart +/- discussions w/ providers and staff.    O:BP (!) 174/69 (BP Location: Right Arm)   Pulse 65   Temp (!) 97.3 F (36.3 C) (Oral)   Resp 18   Ht 5' (1.524 m)   Wt 49.9 kg   LMP  (LMP Unknown)   SpO2 100%   BMI 21.48 kg/m   Intake/Output Summary (Last 24 hours) at 05/13/2021 1013 Last data filed at 05/12/2021 1810 Gross per 24 hour  Intake 100 ml  Output --  Net 100 ml    Intake/Output: I/O last 3 completed shifts: In: 200 [IV Piggyback:200] Out: 2000 [Other:2000]  Intake/Output this shift:  No intake/output data recorded. Weight change:  Physical exam: Patient not seen directly today given COVID-19 + status, utilizing data taken from chart +/- discussions w/ providers and staff.    Recent Labs  Lab 05/09/21 1138 05/10/21 0205 05/11/21 0122 05/12/21 1036 05/13/21 0349  NA 142 141 139 137 139  K 3.9 4.5 4.6 3.3* 4.3  CL 105 105 100 98 101  CO2 27 23 25 27 25   GLUCOSE 160* 173* 163* 278* 54*  BUN 45* 52* 39* 32* 61*  CREATININE 6.21* 6.66* 4.57* 3.21* 4.57*  ALBUMIN  --  2.6* 2.7* 2.8*  --   CALCIUM 8.6* 8.3* 8.6* 8.2* 8.4*  PHOS  --  5.5* 5.4* 3.6  --      Studies/Results: No results found.  (feeding supplement) PROSource Plus  30 mL Oral BID BM   vitamin C  500 mg Oral Daily   atorvastatin  40 mg Oral Daily   carvedilol  25 mg Oral BID WC   doxercalciferol  2 mcg Intravenous Q T,Th,Sa-HD   hydrALAZINE  50 mg Oral TID   insulin aspart  0-5 Units Subcutaneous QHS   insulin aspart  0-9 Units Subcutaneous TID WC   Ipratropium-Albuterol  1 puff Inhalation Q6H   losartan  100 mg Oral Daily   multivitamin with minerals  1 tablet Oral Daily   predniSONE  50 mg Oral Daily   Zanubrutinib  2 capsule Oral Daily   zinc sulfate  220 mg Oral Daily    Dialysis Orders: TTS [facility]  3.5h  450/500   44.5kg   3K/2.5 bath  AVG  Hep NONE (due to low plts) - Hectoral 8mcg IV q HD - Mircera 135mcg IV q 2 weeks (last 7/9)   Assessment/Plan:  Acute CVA: new acute L thalamocapsular infarct: Work-up per neuro.  COVID positive: with new hypoxia and CXR infiltrate - started on IV solu-medrol and remdesivir.   ESRD:  Continue HD per TTS schedule. HD today or tonight.   Hypertension/volume: permissive HTN no longer needed per Neurology.  Will UF as tolerated as she is well above her edw and BP's are high.   Anemia: Hgb 9.9 - not due for ESA yet.  Metabolic bone disease: Ca/Phos ok, continue home binders and VDRA.  Nutrition:  Alb low, will add supplements.  CLL/thrombocytopenia - platelets up slightly to 28, no heparin with HD.  T2DM Disposition - pt with multiple irreversible/end stage disease processes and now with new stroke.  She is a full code here but is under hospice services as an outpatient.  Need to consider a change to code status.  [name], MD 05/13/2021, 10:17 AM

## 2021-05-13 NOTE — TOC Progression Note (Signed)
Transition of Care [facility]) - Progression Note    Patient Details  Name: Christine Gibson MRN: [id number] Date of Birth: 01-17-1951  [facility] CM/SW [place], [place] Phone Number: 05/13/2021, 3:30 PM  Clinical Narrative:    CSW attempted to follow up with referrals sent yesterday. None of the facilities have admissions available on the weekends and will need to be followed up with on Monday. TOC will continue to follow for DC needs.   Expected Discharge Plan: [place] Barriers to Discharge: Continued Medical Work up  Expected Discharge Plan and Services Expected Discharge Plan: [place] In-house Referral: Clinical Social Work Discharge Planning Services: CM Consult Post Acute Care Choice: [place] arrangements for the past 2 months: Single Family Home                                       Social Determinants of Health (SDOH) Interventions    Readmission Risk Interventions No flowsheet data found.

## 2021-05-14 DIAGNOSIS — D696 Thrombocytopenia, unspecified: Secondary | ICD-10-CM | POA: Diagnosis not present

## 2021-05-14 LAB — CBC WITH DIFFERENTIAL/PLATELET
Abs Immature Granulocytes: 0.43 10*3/uL — ABNORMAL HIGH (ref 0.00–0.07)
Basophils Absolute: 0 10*3/uL (ref 0.0–0.1)
Basophils Relative: 0 %
Eosinophils Absolute: 0.1 10*3/uL (ref 0.0–0.5)
Eosinophils Relative: 1 %
HCT: 27 % — ABNORMAL LOW (ref 36.0–46.0)
Hemoglobin: 8.8 g/dL — ABNORMAL LOW (ref 12.0–15.0)
Immature Granulocytes: 3 %
Lymphocytes Relative: 50 %
Lymphs Abs: 7.9 10*3/uL — ABNORMAL HIGH (ref 0.7–4.0)
MCH: 26.7 pg (ref 26.0–34.0)
MCHC: 32.6 g/dL (ref 30.0–36.0)
MCV: 82.1 fL (ref 80.0–100.0)
Monocytes Absolute: 2.4 10*3/uL — ABNORMAL HIGH (ref 0.1–1.0)
Monocytes Relative: 15 %
Neutro Abs: 5 10*3/uL (ref 1.7–7.7)
Neutrophils Relative %: 31 %
Platelets: 36 10*3/uL — ABNORMAL LOW (ref 150–400)
RBC: 3.29 MIL/uL — ABNORMAL LOW (ref 3.87–5.11)
RDW: 18.2 % — ABNORMAL HIGH (ref 11.5–15.5)
WBC: 15.9 10*3/uL — ABNORMAL HIGH (ref 4.0–10.5)
nRBC: 0 % (ref 0.0–0.2)

## 2021-05-14 LAB — GLUCOSE, CAPILLARY
Glucose-Capillary: 102 mg/dL — ABNORMAL HIGH (ref 70–99)
Glucose-Capillary: 154 mg/dL — ABNORMAL HIGH (ref 70–99)
Glucose-Capillary: 353 mg/dL — ABNORMAL HIGH (ref 70–99)

## 2021-05-14 NOTE — Progress Notes (Signed)
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  I did not disavow this knowledge to her, and wonder how much insight she has into her diagnosis and trajectory course of her illness.  She clearly wants to continue HD, and knows what the cessation of treatment would mean.  She states she does not feel the use of oxygen is beneficial to her, and this was discontinued given her stable oxygen sats.    This nurse was informed by patient that her husband was supplying her with her chemotherapeutic medication, zanubrutinib, on a daily basis when he came in to see her.  Stated he did not want to leave the medication here with the pharmacy as it was expensive.

## 2021-05-14 NOTE — Progress Notes (Signed)
PROGRESS NOTE    Christine Gibson  ZOX:[id number] DOB: 1951-04-12 DOA: 05/09/2021 PCP: Jearld Fenton, NP     Brief Narrative:  Christine Gibson is a 70 y.o. 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  She was hemodynamically stable upon presentation to ED.  CT head was abnormal, revealing new 1.6 cm amorphous area of hyperdensity within the right cerebellar hemisphere.  MRI brain revealed small acute left thalamocapsular infarct.  Faint edema without mass-effect.  Seen by neurology who recommended hospitalist admission for stroke work-up.  New events last 24 hours / Subjective: No new events overnight.  Awaiting SNF placement.  Assessment & Plan:   Active Problems:   CLL (chronic lymphocytic leukemia) (HCC)   HLD (hyperlipidemia)   HTN (hypertension)   DM type 2 (diabetes mellitus, type 2) (HCC)   Anal warts   Acute CVA (cerebrovascular accident) ([place])   COVID-19 virus infection   ESRD needing dialysis ([place])   Acute CVA, POA: MRI brain showed small acute left thalamocapsular infarct without mass-effect. MRA head and neck negative for CVO.  Echo shows 50% ejection fraction, grade 1 diastolic dysfunction but no ASD.  SLP cleared for diet.  PT OT recommends SNF.  Neurology cleared for discharge with no antiplatelet due to severe thrombocytopenia.  Continue home medications which includes Coreg, hydralazine, losartan, Lasix   ESRD on HD TTS: Nephrology following.   Prediabetes: Hemoglobin A1c 6.0.  Diet controlled prior to admission.  Currently slightly hyperglycemic due to being on dexamethasone.  Continue SSI.   Chronic hypoxic respiratory failure secondary to acute combined systolic and diastolic CHF: Patient uses 2 L of oxygen at home.  Echo shows 50%  ejection fraction and grade 1 diastolic dysfunction. Elevated BNP greater than 3000.  Chest x-ray also shows pulmonary edema.  Continue hemodialysis.  Continue Lasix   COVID-19 viral infection Self-reported positive COVID-19 screening test 03/15/2021 at outside facility Positive COVID test on 05/09/2021.  Completed 3 doses of remdesivir on 05/11/2021.  Continue prednisone.   Hyperlipidemia: Continue Lipitor.   CLL/chronic thrombocytopenia: Stable.  No signs of bleeding.  Per neurology, not a candidate for any antiplatelet.     Anemia of chronic disease: Hemoglobin at baseline.  Monitor.   Hypokalemia: Resolved.   Sudden right-sided hearing loss: Per husband and patient, she lost her hearing suddenly on the right side about a week ago.  Likely not secondary to stroke since stroke is only lacunar and on the left side.  Per Dr. Doristine Bosworth, discussed with ENT Dr. Hayden Rasmussen and requested to see this patient however was informed that there would be no change in the management even when the patient is seen in the hospital and thus recommended to start this patient on prednisone 60 mg p.o. daily for 10 days and for patient to call ENT office on Monday at [phone] to make an appointment.  DVT prophylaxis:  SCDs Start: 05/09/21 1935  Code Status:     Code Status Orders  (From admission, onward)           Start     Ordered   05/09/21 1936  Full code  Continuous        05/09/21 1937           Code Status History  This patient has a current code status but no historical code status.      Advance Directive Documentation    Flowsheet Row Most Recent Value  Type of Advance Directive Healthcare Power of Attorney, Living will  Pre-existing out of facility DNR order (yellow form or pink MOST form) --  "MOST" Form in Place? --      Family Communication: None at bedside Disposition Plan:  Status is: Inpatient  Remains inpatient appropriate because:Unsafe d/c  plan  Dispo:  Patient From: Home  Planned Disposition: [place]  Medically stable for discharge: No       Antimicrobials:  Anti-infectives (From admission, onward)    Start     Dose/Rate Route Frequency Ordered Stop   05/11/21 1000  remdesivir 100 mg in sodium chloride 0.9 % 100 mL IVPB  Status:  Discontinued       See Hyperspace for full Linked Orders Report.   100 mg 200 mL/hr over 30 Minutes Intravenous Daily 05/09/21 2326 05/10/21 1611   05/11/21 1000  remdesivir 100 mg in sodium chloride 0.9 % 100 mL IVPB       See Hyperspace for full Linked Orders Report.   100 mg 200 mL/hr over 30 Minutes Intravenous Daily 05/10/21 1611 05/12/21 0937   05/10/21 0030  remdesivir 200 mg in sodium chloride 0.9% 250 mL IVPB       See Hyperspace for full Linked Orders Report.   200 mg 580 mL/hr over 30 Minutes Intravenous Once 05/09/21 2326 05/10/21 0105        Objective: Vitals:   05/13/21 2339 05/14/21 0433 05/14/21 0900 05/14/21 1342  BP: (!) 145/58 (!) 179/73 (!) 142/61 138/62  Pulse: 72 67 90 69  Resp: 18 16 19 20   Temp: 98.3 F (36.8 C) 97.7 F (36.5 C) 98.5 F (36.9 C) 98.2 F (36.8 C)  TempSrc: Oral Oral Oral Oral  SpO2: 100% 100% 100% 99%  Weight:      Height:        Intake/Output Summary (Last 24 hours) at 05/14/2021 1403 Last data filed at 05/13/2021 1603 Gross per 24 hour  Intake 177 ml  Output --  Net 177 ml   Filed Weights   05/09/21 1106  Weight: 49.9 kg    Examination:  General exam: Appears calm and comfortable  Respiratory system: Clear to auscultation. Respiratory effort normal. No respiratory distress. No conversational dyspnea. On 2L O2 Cardiovascular system: S1 & S2 heard, RRR. No murmurs. No pedal edema. Gastrointestinal system: Abdomen is nondistended, soft and nontender. Normal bowel sounds heard. Central nervous system: Alert and oriented. No focal neurological deficits. Speech clear.  Extremities: Symmetric in appearance   Skin: No rashes, lesions or ulcers on exposed skin  Psychiatry: Judgement and insight appear normal. Mood & affect appropriate.   Data Reviewed: I have personally reviewed following labs and imaging studies  CBC: Recent Labs  Lab 05/11/21 0122 05/12/21 1036 05/13/21 0349 05/13/21 1059 05/14/21 0601  WBC 6.5 8.4 38.9* 14.1* 15.9*  NEUTROABS 3.2 4.2 10.0* 4.6 5.0  HGB 9.9* 9.4* 10.0* 9.1* 8.8*  HCT 30.9* 28.1* 30.2* 27.2* 27.0*  MCV 84.9 82.6 82.7 81.7 82.1  PLT 28* 39* 58* 39* 36*   Basic Metabolic Panel: Recent Labs  Lab 05/09/21 1138 05/10/21 0205 05/11/21 0122 05/12/21 1036 05/13/21 0349  NA 142 141 139 137 139  K 3.9 4.5 4.6 3.3* 4.3  CL 105 105 100 98 101  CO2 27 23 25 27 25   GLUCOSE 160*  173* 163* 278* 54*  BUN 45* 52* 39* 32* 61*  CREATININE 6.21* 6.66* 4.57* 3.21* 4.57*  CALCIUM 8.6* 8.3* 8.6* 8.2* 8.4*  MG  --  1.8 2.0 1.9  --   PHOS  --  5.5* 5.4* 3.6  --    GFR: Estimated Creatinine Clearance: 8.2 mL/min (A) (by C-G formula based on SCr of 4.57 mg/dL (H)). Liver Function Tests: Recent Labs  Lab 05/10/21 0205 05/11/21 0122 05/12/21 1036  ALBUMIN 2.6* 2.7* 2.8*   No results for input(s): LIPASE, AMYLASE in the last 168 hours. No results for input(s): AMMONIA in the last 168 hours. Coagulation Profile: No results for input(s): INR, PROTIME in the last 168 hours. Cardiac Enzymes: No results for input(s): CKTOTAL, CKMB, CKMBINDEX, TROPONINI in the last 168 hours. BNP (last 3 results) No results for input(s): PROBNP in the last 8760 hours. HbA1C: No results for input(s): HGBA1C in the last 72 hours. CBG: Recent Labs  Lab 05/13/21 1320 05/13/21 1611 05/13/21 2223 05/14/21 0623 05/14/21 1345  GLUCAP 194* 299* 356* 102* 154*   Lipid Profile: No results for input(s): CHOL, HDL, LDLCALC, TRIG, CHOLHDL, LDLDIRECT in the last 72 hours. Thyroid Function Tests: No results for input(s): TSH, T4TOTAL, FREET4, T3FREE, THYROIDAB in the last 72  hours. Anemia Panel: Recent Labs    05/12/21 1036  FERRITIN 4,126*   Sepsis Labs: No results for input(s): PROCALCITON, LATICACIDVEN in the last 168 hours.  Recent Results (from the past 240 hour(s))  Resp Panel by RT-PCR (Flu A&B, Covid) Nasopharyngeal Swab     Status: Abnormal   Collection Time: 05/09/21  4:10 PM   Specimen: Nasopharyngeal Swab; Nasopharyngeal(NP) swabs in vial transport medium  Result Value Ref Range Status   SARS Coronavirus 2 by RT PCR POSITIVE (A) NEGATIVE Final    Comment: RESULT CALLED TO, READ BACK BY AND VERIFIED WITH: A BANKS RN [id number] 05/09/21 A BROWNING (NOTE) SARS-CoV-2 target nucleic acids are DETECTED.  The SARS-CoV-2 RNA is generally detectable in upper respiratory specimens during the acute phase of infection. Positive results are indicative of the presence of the identified virus, but do not rule out bacterial infection or co-infection with other pathogens not detected by the test. Clinical correlation with patient history and other diagnostic information is necessary to determine patient infection status. The expected result is Negative.  Fact Sheet for Patients: EntrepreneurPulse.com.au  Fact Sheet for Healthcare Providers: IncredibleEmployment.be  This test is not yet approved or cleared by the [place] FDA and  has been authorized for detection and/or diagnosis of SARS-CoV-2 by FDA under an Emergency Use Authorization (EUA).  This EUA will remain in effect (meaning this test can b e used) for the duration of  the COVID-19 declaration under Section 564(b)(1) of the Act, 21 U.S.C. section 360bbb-3(b)(1), unless the authorization is terminated or revoked sooner.     Influenza A by PCR NEGATIVE NEGATIVE Final   Influenza B by PCR NEGATIVE NEGATIVE Final    Comment: (NOTE) The Xpert Xpress SARS-CoV-2/FLU/RSV plus assay is intended as an aid in the diagnosis of influenza from Nasopharyngeal swab  specimens and should not be used as a sole basis for treatment. Nasal washings and aspirates are unacceptable for Xpert Xpress SARS-CoV-2/FLU/RSV testing.  Fact Sheet for Patients: EntrepreneurPulse.com.au  Fact Sheet for Healthcare Providers: IncredibleEmployment.be  This test is not yet approved or cleared by the [place] FDA and has been authorized for detection and/or diagnosis of SARS-CoV-2 by FDA under an Emergency Use Authorization (EUA). This  EUA will remain in effect (meaning this test can be used) for the duration of the COVID-19 declaration under Section 564(b)(1) of the Act, 21 U.S.C. section 360bbb-3(b)(1), unless the authorization is terminated or revoked.  Performed at [facility], [place] [address] [place], [place] [address]   C Difficile Quick Screen w PCR reflex     Status: None   Collection Time: 05/11/21  3:20 PM   Specimen: STOOL  Result Value Ref Range Status   C Diff antigen NEGATIVE NEGATIVE Final   C Diff toxin NEGATIVE NEGATIVE Final   C Diff interpretation No C. difficile detected.  Final    Comment: Performed at [facility], [place] [address], [place], [place] [address]      Radiology Studies: No results found.    Scheduled Meds:  (feeding supplement) PROSource Plus  30 mL Oral BID BM   vitamin C  500 mg Oral Daily   atorvastatin  40 mg Oral Daily   carvedilol  25 mg Oral BID WC   doxercalciferol  2 mcg Intravenous Q T,Th,Sa-HD   furosemide  80 mg Oral Daily   hydrALAZINE  50 mg Oral TID   insulin aspart  0-5 Units Subcutaneous QHS   insulin aspart  0-9 Units Subcutaneous TID WC   Ipratropium-Albuterol  1 puff Inhalation Q6H   losartan  100 mg Oral Daily   melatonin  3 mg Oral QHS   multivitamin with minerals  1 tablet Oral Daily   NIFEdipine  90 mg Oral Daily   predniSONE  60 mg Oral Daily   sertraline  100 mg Oral Daily   Zanubrutinib  2 capsule Oral Daily   zinc sulfate  220 mg Oral  Daily   Continuous Infusions:   LOS: 5 days      Time spent: 20 minutes   Dessa Phi, DO [facility] 05/14/2021, 2:03 PM   Available via Epic secure chat 7am-7pm After these hours, please refer to coverage provider listed on [email or web address]

## 2021-05-14 NOTE — Progress Notes (Addendum)
Patient ID: Christine Gibson, female   DOB: 1951-05-19, 70 y.o.   MRN: [id number] S: seen in room, pleasant, no c/o today   O:BP 138/62 (BP Location: Right Arm)   Pulse 69   Temp 98.2 F (36.8 C) (Oral)   Resp 20   Ht 5' (1.524 m)   Wt 49.9 kg   LMP  (LMP Unknown)   SpO2 99%   BMI 21.48 kg/m   Intake/Output Summary (Last 24 hours) at 05/14/2021 1503 Last data filed at 05/14/2021 1400 Gross per 24 hour  Intake 657 ml  Output --  Net 657 ml    Intake/Output: I/O last 3 completed shifts: In: 177 [P.O.:177] Out: -   Intake/Output this shift:  Total I/O In: 480 [P.O.:480] Out: -  Weight change:  Physical exam: GEN: Hard of hearing, NAD. Nasal O2 in place. CV: RRR PULM: CTAB EXTREM: No LE edema ACCESS: LUE AVF + bruit with whistle   Recent Labs  Lab 05/09/21 1138 05/10/21 0205 05/11/21 0122 05/12/21 1036 05/13/21 0349  NA 142 141 139 137 139  K 3.9 4.5 4.6 3.3* 4.3  CL 105 105 100 98 101  CO2 27 23 25 27 25   GLUCOSE 160* 173* 163* 278* 54*  BUN 45* 52* 39* 32* 61*  CREATININE 6.21* 6.66* 4.57* 3.21* 4.57*  ALBUMIN  --  2.6* 2.7* 2.8*  --   CALCIUM 8.6* 8.3* 8.6* 8.2* 8.4*  PHOS  --  5.5* 5.4* 3.6  --    Recent Labs    05/12/21 1036  FERRITIN 4,126*    Studies/Results: No results found.  (feeding supplement) PROSource Plus  30 mL Oral BID BM   vitamin C  500 mg Oral Daily   atorvastatin  40 mg Oral Daily   carvedilol  25 mg Oral BID WC   doxercalciferol  2 mcg Intravenous Q T,Th,Sa-HD   furosemide  80 mg Oral Daily   hydrALAZINE  50 mg Oral TID   insulin aspart  0-5 Units Subcutaneous QHS   insulin aspart  0-9 Units Subcutaneous TID WC   Ipratropium-Albuterol  1 puff Inhalation Q6H   losartan  100 mg Oral Daily   melatonin  3 mg Oral QHS   multivitamin with minerals  1 tablet Oral Daily   NIFEdipine  90 mg Oral Daily   predniSONE  60 mg Oral Daily   sertraline  100 mg Oral Daily   Zanubrutinib  2 capsule Oral Daily   zinc sulfate  220 mg Oral  Daily    Dialysis Orders: TTS [facility]  3.5h  450/500  44.5kg   3K/2.5 bath  AVG  Hep NONE (due to low plts) - Hectoral 65mcg IV q HD - Mircera 158mcg IV q 2 weeks (last 7/9)   Assessment/Plan:  Acute CVA: new acute L thalamocapsular infarct: Work-up per neuro.  COVID positive: with new hypoxia and CXR infiltrate - started on IV solu-medrol and remdesivir.   ESRD:  Continue HD per TTS schedule. HD today off schedule, then next HD Tuesday.    Hypertension/volume: permissive HTN no longer needed per Neurology.  Will UF as tolerated as she is well above her edw and BP's are high. No gross edema on exam today.   Anemia: Hgb 9.9 - not due for ESA yet.  Metabolic bone disease: Ca/Phos ok, continue home binders and VDRA.  Nutrition:  Alb low, will add supplements.  CLL/thrombocytopenia - platelets up slightly to 28, no heparin with HD.  T2DM Disposition - pt  with multiple irreversible/end stage disease processes and now with new stroke.  She is a full code here but is under hospice services as an outpatient.  Need to consider a change to code status.  [name], MD 05/14/2021, 3:03 PM

## 2021-05-15 DIAGNOSIS — D696 Thrombocytopenia, unspecified: Secondary | ICD-10-CM | POA: Diagnosis not present

## 2021-05-15 LAB — GLUCOSE, CAPILLARY
Glucose-Capillary: 143 mg/dL — ABNORMAL HIGH (ref 70–99)
Glucose-Capillary: 105 mg/dL — ABNORMAL HIGH (ref 70–99)
Glucose-Capillary: 565 mg/dL (ref 70–99)
Glucose-Capillary: 571 mg/dL (ref 70–99)
Glucose-Capillary: 531 mg/dL (ref 70–99)

## 2021-05-15 LAB — CBC
HCT: 24.3 % — ABNORMAL LOW (ref 36.0–46.0)
Hemoglobin: 8.1 g/dL — ABNORMAL LOW (ref 12.0–15.0)
MCH: 27.3 pg (ref 26.0–34.0)
MCHC: 33.3 g/dL (ref 30.0–36.0)
MCV: 81.8 fL (ref 80.0–100.0)
Platelets: 38 10*3/uL — ABNORMAL LOW (ref 150–400)
RBC: 2.97 MIL/uL — ABNORMAL LOW (ref 3.87–5.11)
RDW: 17.9 % — ABNORMAL HIGH (ref 11.5–15.5)
WBC: 14.2 10*3/uL — ABNORMAL HIGH (ref 4.0–10.5)
nRBC: 0 % (ref 0.0–0.2)

## 2021-05-15 LAB — BASIC METABOLIC PANEL
Anion gap: 11 (ref 5–15)
BUN: 52 mg/dL — ABNORMAL HIGH (ref 8–23)
CO2: 28 mmol/L (ref 22–32)
Calcium: 8.1 mg/dL — ABNORMAL LOW (ref 8.9–10.3)
Chloride: 99 mmol/L (ref 98–111)
Creatinine, Ser: 4.1 mg/dL — ABNORMAL HIGH (ref 0.44–1.00)
GFR, Estimated: 11 mL/min — ABNORMAL LOW (ref >60)
Glucose, Bld: 170 mg/dL — ABNORMAL HIGH (ref 70–99)
Potassium: 3.8 mmol/L (ref 3.5–5.1)
Sodium: 138 mmol/L (ref 135–145)

## 2021-05-15 LAB — GLUCOSE, RANDOM
Glucose, Bld: 650 mg/dL (ref 70–99)
Glucose, Bld: 460 mg/dL — ABNORMAL HIGH (ref 70–99)

## 2021-05-15 MED ORDER — IPRATROPIUM-ALBUTEROL 20-100 MCG/ACT IN AERS
1.0000 | INHALATION_SPRAY | Freq: Two times a day (BID) | RESPIRATORY_TRACT | Status: DC
  Administered 2021-05-15: 1 via RESPIRATORY_TRACT

## 2021-05-15 MED ORDER — IPRATROPIUM-ALBUTEROL 20-100 MCG/ACT IN AERS: 1.0000 | INHALATION_SPRAY | Freq: Two times a day (BID) | RESPIRATORY_TRACT | Status: DC

## 2021-05-15 MED ORDER — IPRATROPIUM-ALBUTEROL 20-100 MCG/ACT IN AERS
1.0000 | INHALATION_SPRAY | Freq: Four times a day (QID) | RESPIRATORY_TRACT | Status: DC | PRN
  Administered 2021-05-15: 1 via RESPIRATORY_TRACT

## 2021-05-15 NOTE — Progress Notes (Signed)
Patient ID: Christine Gibson, female   DOB: 07-22-1951, 70 y.o.   MRN: [id number] S: had HD yesterday    Patient not seen directly today given COVID-19 + status, utilizing data taken from chart +/- discussions w/ providers and staff.     O:BP (!) 167/58 (BP Location: Right Arm)   Pulse 85   Temp 98.9 F (37.2 C) (Oral)   Resp 18   Ht 5' (1.524 m)   Wt 45.3 kg   LMP  (LMP Unknown)   SpO2 100%   BMI 19.50 kg/m   Intake/Output Summary (Last 24 hours) at 05/15/2021 1313 Last data filed at 05/14/2021 1900 Gross per 24 hour  Intake 240 ml  Output 1800 ml  Net -1560 ml    Intake/Output: I/O last 3 completed shifts: In: 480 [P.O.:480] Out: 1800 [Other:1800]  Intake/Output this shift:  No intake/output data recorded. Weight change:  Physical exam: Patient not seen directly today given COVID-19 + status, utilizing data taken from chart +/- discussions w/ providers and staff.     Recent Labs  Lab 05/09/21 1138 05/10/21 0205 05/11/21 0122 05/12/21 1036 05/13/21 0349 05/15/21 0419  NA 142 141 139 137 139 138  K 3.9 4.5 4.6 3.3* 4.3 3.8  CL 105 105 100 98 101 99  CO2 27 23 25 27 25 28   GLUCOSE 160* 173* 163* 278* 54* 170*  BUN 45* 52* 39* 32* 61* 52*  CREATININE 6.21* 6.66* 4.57* 3.21* 4.57* 4.10*  ALBUMIN  --  2.6* 2.7* 2.8*  --   --   CALCIUM 8.6* 8.3* 8.6* 8.2* 8.4* 8.1*  PHOS  --  5.5* 5.4* 3.6  --   --     No results for input(s): IRON, TIBC, TRANSFERRIN, FERRITIN in the last 72 hours.  Studies/Results: No results found.  (feeding supplement) PROSource Plus  30 mL Oral BID BM   vitamin C  500 mg Oral Daily   atorvastatin  40 mg Oral Daily   carvedilol  25 mg Oral BID WC   doxercalciferol  2 mcg Intravenous Q T,Th,Sa-HD   furosemide  80 mg Oral Daily   hydrALAZINE  50 mg Oral TID   insulin aspart  0-5 Units Subcutaneous QHS   insulin aspart  0-9 Units Subcutaneous TID WC   Ipratropium-Albuterol  1 puff Inhalation BID   losartan  100 mg Oral Daily   melatonin   3 mg Oral QHS   multivitamin with minerals  1 tablet Oral Daily   NIFEdipine  90 mg Oral Daily   predniSONE  60 mg Oral Daily   sertraline  100 mg Oral Daily   Zanubrutinib  2 capsule Oral Daily   zinc sulfate  220 mg Oral Daily    Dialysis Orders: TTS [facility]  3.5h  450/500  44.5kg   3K/2.5 bath  AVG  Hep NONE (due to low plts) - Hectoral 32mcg IV q HD - Mircera 145mcg IV q 2 weeks (last 7/9)   Assessment/Plan:  Acute CVA: new acute L thalamocapsular infarct: Work-up per neuro.  COVID positive: with new hypoxia and CXR infiltrate, treated w/ IV solu-medrol and remdesivir.   ESRD:  Continue HD per TTS schedule. Next HD Tuesday.    Hypertension/volume: permissive HTN no longer needed per Neurology. 1kg over dry wt today, last exam no gross edema  Anemia: Hgb 9.9 - not due for ESA yet.  Metabolic bone disease: Ca/Phos ok, continue home binders and VDRA.  Nutrition:  Alb low, will add supplements.  CLL/thrombocytopenia - platelets up slightly to 28, no heparin with HD.  T2DM Disposition - pt with multiple irreversible/end stage disease processes and now with new stroke.  She is a full code here but is under hospice services as an outpatient.  Consider a change to code status.  Christine Splinter, MD 05/15/2021, 1:13 PM

## 2021-05-15 NOTE — Progress Notes (Signed)
PROGRESS NOTE    Christine Gibson  NFA:[id number] DOB: May 01, 1951 DOA: 05/09/2021 PCP: Jearld Fenton, NP   Brief Narrative:   Christine Gibson is a 70 y.o. 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  She was hemodynamically stable upon presentation to ED.  CT head was abnormal, revealing new 1.6 cm amorphous area of hyperdensity within the right cerebellar hemisphere.  MRI brain revealed small acute left thalamocapsular infarct.  Faint edema without mass-effect.  Seen by neurology who recommended hospitalist admission for stroke work-up.  -She is currently awaiting SNF placement.  Looking at [facility] facilities per [facility].  Assessment & Plan:   Active Problems:   CLL (chronic lymphocytic leukemia) (HCC)   HLD (hyperlipidemia)   HTN (hypertension)   DM type 2 (diabetes mellitus, type 2) (HCC)   Anal warts   Acute CVA (cerebrovascular accident) ([place])   COVID-19 virus infection   ESRD needing dialysis ([place])   Acute CVA, POA: MRI brain showed small acute left thalamocapsular infarct without mass-effect. MRA head and neck negative for CVO.  Echo shows 50% ejection fraction, grade 1 diastolic dysfunction but no ASD.  SLP cleared for diet.  PT OT recommends SNF.  Neurology cleared for discharge with no antiplatelet due to severe thrombocytopenia.  Continue home medications which includes Coreg, hydralazine, losartan, Lasix   ESRD on HD TTS: Nephrology following.   Prediabetes: Hemoglobin A1c 6.0.  Diet controlled prior to admission.  Currently slightly hyperglycemic due to being on steroids.  Continue SSI.   Chronic hypoxic respiratory failure secondary to acute combined systolic and diastolic CHF: Patient uses 2 L of oxygen at home.  Echo shows 50% ejection fraction and  grade 1 diastolic dysfunction. Elevated BNP greater than 3000.  Chest x-ray also shows pulmonary edema.  Continue hemodialysis.  Continue Lasix   COVID-19 viral infection Self-reported positive COVID-19 screening test 03/15/2021 at outside facility Positive COVID test on 05/09/2021.  Completed 3 doses of remdesivir on 05/11/2021.  Continue prednisone as noted below.   Hyperlipidemia: Continue Lipitor.   CLL/chronic thrombocytopenia: Stable.  No signs of bleeding.  Per neurology, not a candidate for any antiplatelet.  Continue to monitor CBC.   Anemia of chronic disease: Likely related to ESRD. Hemoglobin at baseline.  Monitor.   Sudden right-sided hearing loss: Per husband and patient, she lost her hearing suddenly on the right side about a week ago.  Likely not secondary to stroke since stroke is only lacunar and on the left side.  Per Dr. Doristine Bosworth, discussed with ENT Dr. Hayden Rasmussen and requested to see this patient however was informed that there would be no change in the management even when the patient is seen in the hospital and thus recommended to start this patient on prednisone 60 mg p.o. daily for 10 days and for patient to call ENT office [phone] to make an appointment on discharge.   DVT prophylaxis:SCDs Code Status: Full Family Communication: None at bedside Disposition Plan:  Status is: Inpatient  Remains inpatient appropriate because:Unsafe d/c plan  Dispo:  Patient From: Home  Planned Disposition: [place]  Medically stable for discharge: No      Consultants:  Neurology Nephrology  Procedures:  See below  Antimicrobials:  Anti-infectives (From admission, onward)    Start  Dose/Rate Route Frequency Ordered Stop   05/11/21 1000  remdesivir 100 mg in sodium chloride 0.9 % 100 mL IVPB  Status:  Discontinued       See Hyperspace for full Linked Orders Report.   100 mg 200 mL/hr over 30 Minutes Intravenous Daily 05/09/21 2326 05/10/21 1611    05/11/21 1000  remdesivir 100 mg in sodium chloride 0.9 % 100 mL IVPB       See Hyperspace for full Linked Orders Report.   100 mg 200 mL/hr over 30 Minutes Intravenous Daily 05/10/21 1611 05/12/21 0937   05/10/21 0030  remdesivir 200 mg in sodium chloride 0.9% 250 mL IVPB       See Hyperspace for full Linked Orders Report.   200 mg 580 mL/hr over 30 Minutes Intravenous Once 05/09/21 2326 05/10/21 0105       Subjective: Patient seen and evaluated today with no new acute complaints or concerns. No acute concerns or events noted overnight.  Objective: Vitals:   05/15/21 0449 05/15/21 0500 05/15/21 0600 05/15/21 0858  BP: (!) 171/70  (!) 156/69 (!) 175/63  Pulse: 83  87 74  Resp: 20   18  Temp: 98.9 F (37.2 C)   98.8 F (37.1 C)  TempSrc: Oral   Oral  SpO2: 100%   100%  Weight:  45.3 kg    Height:        Intake/Output Summary (Last 24 hours) at 05/15/2021 1141 Last data filed at 05/14/2021 1900 Gross per 24 hour  Intake 240 ml  Output 1800 ml  Net -1560 ml   Filed Weights   05/14/21 1548 05/14/21 1900 05/15/21 0500  Weight: 44.5 kg 42.3 kg 45.3 kg    Examination:  General exam: Appears calm and comfortable  Respiratory system: Clear to auscultation. Respiratory effort normal. Cardiovascular system: S1 & S2 heard, RRR.  Gastrointestinal system: Abdomen is soft Central nervous system: Alert and awake Extremities: No edema Skin: No significant lesions noted Psychiatry: Flat affect.    Data Reviewed: I have personally reviewed following labs and imaging studies  CBC: Recent Labs  Lab 05/11/21 0122 05/12/21 1036 05/13/21 0349 05/13/21 1059 05/14/21 0601 05/15/21 0419  WBC 6.5 8.4 38.9* 14.1* 15.9* 14.2*  NEUTROABS 3.2 4.2 10.0* 4.6 5.0  --   HGB 9.9* 9.4* 10.0* 9.1* 8.8* 8.1*  HCT 30.9* 28.1* 30.2* 27.2* 27.0* 24.3*  MCV 84.9 82.6 82.7 81.7 82.1 81.8  PLT 28* 39* 58* 39* 36* 38*   Basic Metabolic Panel: Recent Labs  Lab 05/10/21 0205 05/11/21 0122  05/12/21 1036 05/13/21 0349 05/15/21 0419  NA 141 139 137 139 138  K 4.5 4.6 3.3* 4.3 3.8  CL 105 100 98 101 99  CO2 23 25 27 25 28   GLUCOSE 173* 163* 278* 54* 170*  BUN 52* 39* 32* 61* 52*  CREATININE 6.66* 4.57* 3.21* 4.57* 4.10*  CALCIUM 8.3* 8.6* 8.2* 8.4* 8.1*  MG 1.8 2.0 1.9  --   --   PHOS 5.5* 5.4* 3.6  --   --    GFR: Estimated Creatinine Clearance: 9.1 mL/min (A) (by C-G formula based on SCr of 4.1 mg/dL (H)). Liver Function Tests: Recent Labs  Lab 05/10/21 0205 05/11/21 0122 05/12/21 1036  ALBUMIN 2.6* 2.7* 2.8*   No results for input(s): LIPASE, AMYLASE in the last 168 hours. No results for input(s): AMMONIA in the last 168 hours. Coagulation Profile: No results for input(s): INR, PROTIME in the last 168 hours. Cardiac Enzymes: No results for input(s):  CKTOTAL, CKMB, CKMBINDEX, TROPONINI in the last 168 hours. BNP (last 3 results) No results for input(s): PROBNP in the last 8760 hours. HbA1C: No results for input(s): HGBA1C in the last 72 hours. CBG: Recent Labs  Lab 05/14/21 0623 05/14/21 1345 05/14/21 2145 05/15/21 0617 05/15/21 1136  GLUCAP 102* 154* 353* 143* 105*   Lipid Profile: No results for input(s): CHOL, HDL, LDLCALC, TRIG, CHOLHDL, LDLDIRECT in the last 72 hours. Thyroid Function Tests: No results for input(s): TSH, T4TOTAL, FREET4, T3FREE, THYROIDAB in the last 72 hours. Anemia Panel: No results for input(s): VITAMINB12, FOLATE, FERRITIN, TIBC, IRON, RETICCTPCT in the last 72 hours. Sepsis Labs: No results for input(s): PROCALCITON, LATICACIDVEN in the last 168 hours.  Recent Results (from the past 240 hour(s))  Resp Panel by RT-PCR (Flu A&B, Covid) Nasopharyngeal Swab     Status: Abnormal   Collection Time: 05/09/21  4:10 PM   Specimen: Nasopharyngeal Swab; Nasopharyngeal(NP) swabs in vial transport medium  Result Value Ref Range Status   SARS Coronavirus 2 by RT PCR POSITIVE (A) NEGATIVE Final    Comment: RESULT CALLED TO, READ  BACK BY AND VERIFIED WITH: A BANKS RN [id number] 05/09/21 A BROWNING (NOTE) SARS-CoV-2 target nucleic acids are DETECTED.  The SARS-CoV-2 RNA is generally detectable in upper respiratory specimens during the acute phase of infection. Positive results are indicative of the presence of the identified virus, but do not rule out bacterial infection or co-infection with other pathogens not detected by the test. Clinical correlation with patient history and other diagnostic information is necessary to determine patient infection status. The expected result is Negative.  Fact Sheet for Patients: EntrepreneurPulse.com.au  Fact Sheet for Healthcare Providers: IncredibleEmployment.be  This test is not yet approved or cleared by the [place] FDA and  has been authorized for detection and/or diagnosis of SARS-CoV-2 by FDA under an Emergency Use Authorization (EUA).  This EUA will remain in effect (meaning this test can b e used) for the duration of  the COVID-19 declaration under Section 564(b)(1) of the Act, 21 U.S.C. section 360bbb-3(b)(1), unless the authorization is terminated or revoked sooner.     Influenza A by PCR NEGATIVE NEGATIVE Final   Influenza B by PCR NEGATIVE NEGATIVE Final    Comment: (NOTE) The Xpert Xpress SARS-CoV-2/FLU/RSV plus assay is intended as an aid in the diagnosis of influenza from Nasopharyngeal swab specimens and should not be used as a sole basis for treatment. Nasal washings and aspirates are unacceptable for Xpert Xpress SARS-CoV-2/FLU/RSV testing.  Fact Sheet for Patients: EntrepreneurPulse.com.au  Fact Sheet for Healthcare Providers: IncredibleEmployment.be  This test is not yet approved or cleared by the [place] FDA and has been authorized for detection and/or diagnosis of SARS-CoV-2 by FDA under an Emergency Use Authorization (EUA). This EUA will remain in effect (meaning  this test can be used) for the duration of the COVID-19 declaration under Section 564(b)(1) of the Act, 21 U.S.C. section 360bbb-3(b)(1), unless the authorization is terminated or revoked.  Performed at [facility], [place] [address] [place], [place] [address]   C Difficile Quick Screen w PCR reflex     Status: None   Collection Time: 05/11/21  3:20 PM   Specimen: STOOL  Result Value Ref Range Status   C Diff antigen NEGATIVE NEGATIVE Final   C Diff toxin NEGATIVE NEGATIVE Final   C Diff interpretation No C. difficile detected.  Final    Comment: Performed at [facility], [place] [address],  [place], [place] [address]         Radiology Studies: No results found.      Scheduled Meds:  (feeding supplement) PROSource Plus  30 mL Oral BID BM   vitamin C  500 mg Oral Daily   atorvastatin  40 mg Oral Daily   carvedilol  25 mg Oral BID WC   doxercalciferol  2 mcg Intravenous Q T,Th,Sa-HD   furosemide  80 mg Oral Daily   hydrALAZINE  50 mg Oral TID   insulin aspart  0-5 Units Subcutaneous QHS   insulin aspart  0-9 Units Subcutaneous TID WC   Ipratropium-Albuterol  1 puff Inhalation BID   losartan  100 mg Oral Daily   melatonin  3 mg Oral QHS   multivitamin with minerals  1 tablet Oral Daily   NIFEdipine  90 mg Oral Daily   predniSONE  60 mg Oral Daily   sertraline  100 mg Oral Daily   Zanubrutinib  2 capsule Oral Daily   zinc sulfate  220 mg Oral Daily     LOS: 6 days    Time spent: 35 minutes    Amelianna Meller Darleen Crocker, DO [facility]  If 7PM-7AM, please contact night-coverage [email or web address] 05/15/2021, 11:41 AM

## 2021-05-15 NOTE — Progress Notes (Signed)
Inpatient Diabetes Program Recommendations  AACE/ADA: New Consensus Statement on Inpatient Glycemic Control (2015)  Target Ranges:  Prepandial:   less than 140 mg/dL      Peak postprandial:   less than 180 mg/dL (1-2 hours)      Critically ill patients:  140 - 180 mg/dL   Lab Results  Component Value Date   GLUCAP 143 (H) 05/15/2021   HGBA1C 6.0 (H) 05/10/2021    Review of Glycemic Control Results for Christine Gibson, Christine Gibson (MRN [id number]) as of 05/15/2021 09:09  Ref. Range 05/13/2021 06:25 05/13/2021 13:20 05/13/2021 16:11 05/13/2021 22:23 05/14/2021 06:23 05/14/2021 13:45 05/14/2021 21:45 05/15/2021 06:17  Glucose-Capillary Latest Ref Range: 70 - 99 mg/dL 98 194 (H) 299 (H) 356 (H) 102 (H) 154 (H) 353 (H) 143 (H)   Diabetes history: DM 2 Outpatient Diabetes medications: diet controlled Current orders for Inpatient glycemic control:  Novolog 0-9 units tid + hs  Inpatient Diabetes Program Recommendations:    On PO prednisone 60 mg Daily Glucose trends increase after steroid dose. If pt remains on same dose of prednisone consider:  -  Adding Novolog 2 units tid meal coverage if eating >50% of meals.  Thanks,  Tama Headings RN, MSN, BC-ADM Inpatient Diabetes Coordinator Team Pager [phone] (8a-5p)

## 2021-05-15 NOTE — Plan of Care (Signed)

## 2021-05-15 NOTE — TOC Progression Note (Addendum)
Transition of Care [facility]) - Progression Note    Patient Details  Name: Christine Gibson MRN: [id number] Date of Birth: Aug 24, 1951  Transition of Care [facility]) CM/SW [place], [place] Phone Number: 05/15/2021, 10:54 AM  Clinical Narrative:    4:15 pm 4pm CSW received call from Viola at [facility] who was asking for more information. She needed to know about Hospice, pt's DC plans after SNF and her dialysis arrangements. CSW spoke with spouse who verified pt went to [facility] on [address] For dialysis. He stated that CSW should discuss with [facility] and hospice social workers to determine next steps. He also stated he has been struggling to care for pt at home, and would be open to longer term care. CSW attempted x4 to relay this information, however facility did not answer phone, unable to leave a voicemail.   CSW followed up with facilities in [place], [place] actively reviewing, refaxed referral to [facility]. CSW spoke with pt's hospice social worker Elmyra Ricks [phone]), update was given. CSW alerted that pt may also be accepted by [facility]. Driscilla Grammes Phone# [phone] Fax# [phone] [facility] [place] says no beds now, but will look at the referral. [place] Phone# [phone] Fax# [phone] Admissions not available, will look at referral when they return. [facility] [place] Phone# [phone] Fax# [phone] No VA beds Available [place] Phone# [phone] Fax# [place] said they would review referral [place] Phone# [phone] Fax# [phone] [facility] currently has a female bed, will review. [facility] and rehab Phone # [phone] Fax# [phone] Maggie will review referral. [place] and rehab Phone# [phone] Fax# [phone] Beau Fanny will review referral.   Expected Discharge Plan: [place] Barriers to Discharge: Continued Medical Work  up  Expected Discharge Plan and Services Expected Discharge Plan: [place] In-house Referral: Clinical Social Work Discharge Planning Services: CM Consult Post Acute Care Choice: [place] arrangements for the past 2 months: Single Family Home                                       Social Determinants of Health (SDOH) Interventions    Readmission Risk Interventions No flowsheet data found.

## 2021-05-16 DIAGNOSIS — U071 COVID-19: Secondary | ICD-10-CM | POA: Diagnosis not present

## 2021-05-16 DIAGNOSIS — E119 Type 2 diabetes mellitus without complications: Secondary | ICD-10-CM | POA: Diagnosis not present

## 2021-05-16 LAB — BASIC METABOLIC PANEL
Anion gap: 13 (ref 5–15)
BUN: 80 mg/dL — ABNORMAL HIGH (ref 8–23)
CO2: 25 mmol/L (ref 22–32)
Calcium: 7.7 mg/dL — ABNORMAL LOW (ref 8.9–10.3)
Chloride: 96 mmol/L — ABNORMAL LOW (ref 98–111)
Creatinine, Ser: 5.31 mg/dL — ABNORMAL HIGH (ref 0.44–1.00)
GFR, Estimated: 8 mL/min — ABNORMAL LOW (ref >60)
Glucose, Bld: 315 mg/dL — ABNORMAL HIGH (ref 70–99)
Potassium: 4.1 mmol/L (ref 3.5–5.1)
Sodium: 134 mmol/L — ABNORMAL LOW (ref 135–145)

## 2021-05-16 LAB — CBC
HCT: 24.6 % — ABNORMAL LOW (ref 36.0–46.0)
Hemoglobin: 8.2 g/dL — ABNORMAL LOW (ref 12.0–15.0)
MCH: 27.3 pg (ref 26.0–34.0)
MCHC: 33.3 g/dL (ref 30.0–36.0)
MCV: 82 fL (ref 80.0–100.0)
Platelets: 41 10*3/uL — ABNORMAL LOW (ref 150–400)
RBC: 3 MIL/uL — ABNORMAL LOW (ref 3.87–5.11)
RDW: 18.6 % — ABNORMAL HIGH (ref 11.5–15.5)
WBC: 15.6 10*3/uL — ABNORMAL HIGH (ref 4.0–10.5)
nRBC: 0 % (ref 0.0–0.2)

## 2021-05-16 LAB — GLUCOSE, CAPILLARY
Glucose-Capillary: 111 mg/dL — ABNORMAL HIGH (ref 70–99)
Glucose-Capillary: 167 mg/dL — ABNORMAL HIGH (ref 70–99)
Glucose-Capillary: 168 mg/dL — ABNORMAL HIGH (ref 70–99)
Glucose-Capillary: 185 mg/dL — ABNORMAL HIGH (ref 70–99)
Glucose-Capillary: 311 mg/dL — ABNORMAL HIGH (ref 70–99)

## 2021-05-16 LAB — PATHOLOGIST SMEAR REVIEW

## 2021-05-16 MED ORDER — DOXERCALCIFEROL 4 MCG/2ML IV SOLN
INTRAVENOUS | Status: AC
  Administered 2021-05-16: 2 ug via INTRAVENOUS
  Filled 2021-05-16: qty 2

## 2021-05-16 MED ORDER — DARBEPOETIN ALFA 150 MCG/0.3ML IJ SOSY: 150.0000 ug | PREFILLED_SYRINGE | INTRAMUSCULAR | Status: DC

## 2021-05-16 NOTE — Plan of Care (Signed)

## 2021-05-16 NOTE — TOC Progression Note (Addendum)
Transition of Care [facility]) - Progression Note    Patient Details  Name: Christine Gibson MRN: [id number] Date of Birth: 1951-02-06  Transition of Care [facility]) CM/SW Contact  Pollie Friar, RN Phone Number: 05/16/2021, 2:59 PM  Clinical Narrative:    CM called and clarified the coverage for SNF under the [facility] with Steva Ready. She states the patient is approved for SNF (rehab if desired) and LTC. She states they will do a contract with the new HD facility and will provide coverage for the transportation for HD. CM called [facility] and had to leave a message.  CM called [facility] and they are still reviewing. CM updated her on the LTC needs.  Pt also faxed to [facility] in [place] and [facility] in [place]. TOC following.   Expected Discharge Plan: [place] Barriers to Discharge: Continued Medical Work up  Expected Discharge Plan and Services Expected Discharge Plan: [place] In-house Referral: Clinical Social Work Discharge Planning Services: CM Consult Post Acute Care Choice: [place] arrangements for the past 2 months: Single Family Home                                       Social Determinants of Health (SDOH) Interventions    Readmission Risk Interventions No flowsheet data found.

## 2021-05-16 NOTE — Progress Notes (Signed)
PROGRESS NOTE    Christine Gibson  VQM:[id number] DOB: [birth year] DOA: 05/09/2021 PCP: Jearld Fenton, NP   Brief Narrative:   Christine Gibson is a 70 y.o. 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  MRI brain revealed small acute left thalamocapsular infarct.  Faint edema without mass-effect.  Seen by neurology who recommended hospitalist admission for stroke work-up.  -She is currently awaiting SNF placement.  Looking at [facility] facilities per [facility]  Assessment & Plan:   Active Problems:   CLL (chronic lymphocytic leukemia) (HCC)   HLD (hyperlipidemia)   HTN (hypertension)   DM type 2 (diabetes mellitus, type 2) (HCC)   Anal warts   Acute CVA (cerebrovascular accident) ([place])   COVID-19 virus infection   ESRD needing dialysis ([place])   Acute CVA, POA: MRI brain showed small acute left thalamocapsular infarct without mass-effect. MRA head and neck negative for CVO.  Echo shows 50% ejection fraction, grade 1 diastolic dysfunction but no ASD.  -no antiplatelet due to severe thrombocytopenia.  Continue home medications which includes Coreg, hydralazine, losartan, Lasix   ESRD on HD TTS: Nephrology following.   Prediabetes: Hemoglobin A1c 6.0.  Diet controlled prior to admission.  Currently slightly hyperglycemic due to being on steroids.  Continue SSI.   Chronic hypoxic respiratory failure secondary to acute combined systolic and diastolic CHF: Patient uses 2 L of oxygen at home.  Echo shows 50% ejection fraction and grade 1 diastolic dysfunction. Elevated BNP greater than 3000.  Chest x-ray also shows pulmonary edema.  Continue hemodialysis.  Continue Lasix   COVID-19 viral infection Self-reported positive COVID-19 screening test 03/15/2021 at outside  facility Positive COVID test on 05/09/2021.  Completed 3 doses of remdesivir on 05/11/2021.  Continue prednisone as noted below per ENT   Hyperlipidemia: Continue Lipitor.   CLL/chronic thrombocytopenia: Stable.  No signs of bleeding.  Per neurology, not a candidate for any antiplatelet.  Continue to monitor CBC.   Anemia of chronic disease: Likely related to ESRD. Hemoglobin at baseline.  Monitor.   Sudden right-sided hearing loss: Per husband and patient, she lost her hearing suddenly on the right side about a week ago.  Likely not secondary to stroke since stroke is only lacunar and on the left side.  Per Dr. Doristine Bosworth, discussed with ENT Dr. Hayden Rasmussen and requested to see this patient however was informed that there would be no change in the management even when the patient is seen in the hospital and thus recommended to start this patient on prednisone 60 mg p.o. daily for 10 days and for patient to call ENT office [phone] to make an appointment on discharge.   DVT prophylaxis:SCDs Code Status: Full Family Communication: None at bedside Disposition Plan:  Status is: Inpatient  Remains inpatient appropriate because:Unsafe d/c plan  Dispo:  Patient From: Home  Planned Disposition: [place]  Medically stable for discharge: yes      Consultants:  Neurology Nephrology  Procedures:  See below  Antimicrobials:  Anti-infectives (From admission, onward)    Start     Dose/Rate Route Frequency Ordered Stop   05/11/21 1000  remdesivir 100 mg in sodium chloride 0.9 % 100 mL IVPB  Status:  Discontinued       See Hyperspace for full Linked Orders Report.  100 mg 200 mL/hr over 30 Minutes Intravenous Daily 05/09/21 2326 05/10/21 1611   05/11/21 1000  remdesivir 100 mg in sodium chloride 0.9 % 100 mL IVPB       See Hyperspace for full Linked Orders Report.   100 mg 200 mL/hr over 30 Minutes Intravenous Daily 05/10/21 1611 05/12/21 0937   05/10/21 0030  remdesivir 200  mg in sodium chloride 0.9% 250 mL IVPB       See Hyperspace for full Linked Orders Report.   200 mg 580 mL/hr over 30 Minutes Intravenous Once 05/09/21 2326 05/10/21 0105       Subjective: Waiting for HD  Objective: Vitals:   05/16/21 0429 05/16/21 0920 05/16/21 1243 05/16/21 1436  BP: 119/60 (!) 149/62 (!) 187/67 (!) 152/63  Pulse: 75 79 76   Resp: 18 20 19    Temp: 98.4 F (36.9 C) 98.3 F (36.8 C) 98.7 F (37.1 C)   TempSrc: Oral Oral Oral   SpO2: 100% 100% 100%   Weight:      Height:        Intake/Output Summary (Last 24 hours) at 05/16/2021 1528 Last data filed at 05/16/2021 0941 Gross per 24 hour  Intake 117 ml  Output --  Net 117 ml   Filed Weights   05/14/21 1548 05/14/21 1900 05/15/21 0500  Weight: 44.5 kg 42.3 kg 45.3 kg    Examination:   General: Appearance:    Thin female in no acute distress     Lungs:     respirations unlabored  Heart:    Normal heart rate. Normal rhythm. No murmurs, rubs, or gallops.    MS:   All extremities are intact.    Neurologic:   Awake, alert       Data Reviewed: I have personally reviewed following labs and imaging studies  CBC: Recent Labs  Lab 05/11/21 0122 05/12/21 1036 05/13/21 0349 05/13/21 1059 05/14/21 0601 05/15/21 0419 05/16/21 0142  WBC 6.5 8.4 38.9* 14.1* 15.9* 14.2* 15.6*  NEUTROABS 3.2 4.2 10.0* 4.6 5.0  --   --   HGB 9.9* 9.4* 10.0* 9.1* 8.8* 8.1* 8.2*  HCT 30.9* 28.1* 30.2* 27.2* 27.0* 24.3* 24.6*  MCV 84.9 82.6 82.7 81.7 82.1 81.8 82.0  PLT 28* 39* 58* 39* 36* 38* 41*   Basic Metabolic Panel: Recent Labs  Lab 05/10/21 0205 05/11/21 0122 05/12/21 1036 05/13/21 0349 05/15/21 0419 05/15/21 1805 05/15/21 2228 05/16/21 0142  NA 141 139 137 139 138  --   --  134*  K 4.5 4.6 3.3* 4.3 3.8  --   --  4.1  CL 105 100 98 101 99  --   --  96*  CO2 23 25 27 25 28   --   --  25  GLUCOSE 173* 163* 278* 54* 170* 650* 460* 315*  BUN 52* 39* 32* 61* 52*  --   --  80*  CREATININE 6.66* 4.57*  3.21* 4.57* 4.10*  --   --  5.31*  CALCIUM 8.3* 8.6* 8.2* 8.4* 8.1*  --   --  7.7*  MG 1.8 2.0 1.9  --   --   --   --   --   PHOS 5.5* 5.4* 3.6  --   --   --   --   --    GFR: Estimated Creatinine Clearance: 7 mL/min (A) (by C-G formula based on SCr of 5.31 mg/dL (H)). Liver Function Tests: Recent Labs  Lab 05/10/21 0205 05/11/21 0122 05/12/21 1036  ALBUMIN  2.6* 2.7* 2.8*   No results for input(s): LIPASE, AMYLASE in the last 168 hours. No results for input(s): AMMONIA in the last 168 hours. Coagulation Profile: No results for input(s): INR, PROTIME in the last 168 hours. Cardiac Enzymes: No results for input(s): CKTOTAL, CKMB, CKMBINDEX, TROPONINI in the last 168 hours. BNP (last 3 results) No results for input(s): PROBNP in the last 8760 hours. HbA1C: No results for input(s): HGBA1C in the last 72 hours. CBG: Recent Labs  Lab 05/15/21 1743 05/15/21 2127 05/16/21 0344 05/16/21 0611 05/16/21 1238  GLUCAP 571* 531* 168* 111* 185*   Lipid Profile: No results for input(s): CHOL, HDL, LDLCALC, TRIG, CHOLHDL, LDLDIRECT in the last 72 hours. Thyroid Function Tests: No results for input(s): TSH, T4TOTAL, FREET4, T3FREE, THYROIDAB in the last 72 hours. Anemia Panel: No results for input(s): VITAMINB12, FOLATE, FERRITIN, TIBC, IRON, RETICCTPCT in the last 72 hours. Sepsis Labs: No results for input(s): PROCALCITON, LATICACIDVEN in the last 168 hours.  Recent Results (from the past 240 hour(s))  Resp Panel by RT-PCR (Flu A&B, Covid) Nasopharyngeal Swab     Status: Abnormal   Collection Time: 05/09/21  4:10 PM   Specimen: Nasopharyngeal Swab; Nasopharyngeal(NP) swabs in vial transport medium  Result Value Ref Range Status   SARS Coronavirus 2 by RT PCR POSITIVE (A) NEGATIVE Final    Comment: RESULT CALLED TO, READ BACK BY AND VERIFIED WITH: A BANKS RN [id number] 05/09/21 A BROWNING (NOTE) SARS-CoV-2 target nucleic acids are DETECTED.  The SARS-CoV-2 RNA is generally detectable in  upper respiratory specimens during the acute phase of infection. Positive results are indicative of the presence of the identified virus, but do not rule out bacterial infection or co-infection with other pathogens not detected by the test. Clinical correlation with patient history and other diagnostic information is necessary to determine patient infection status. The expected result is Negative.  Fact Sheet for Patients: EntrepreneurPulse.com.au  Fact Sheet for Healthcare Providers: IncredibleEmployment.be  This test is not yet approved or cleared by the [place] FDA and  has been authorized for detection and/or diagnosis of SARS-CoV-2 by FDA under an Emergency Use Authorization (EUA).  This EUA will remain in effect (meaning this test can b e used) for the duration of  the COVID-19 declaration under Section 564(b)(1) of the Act, 21 U.S.C. section 360bbb-3(b)(1), unless the authorization is terminated or revoked sooner.     Influenza A by PCR NEGATIVE NEGATIVE Final   Influenza B by PCR NEGATIVE NEGATIVE Final    Comment: (NOTE) The Xpert Xpress SARS-CoV-2/FLU/RSV plus assay is intended as an aid in the diagnosis of influenza from Nasopharyngeal swab specimens and should not be used as a sole basis for treatment. Nasal washings and aspirates are unacceptable for Xpert Xpress SARS-CoV-2/FLU/RSV testing.  Fact Sheet for Patients: EntrepreneurPulse.com.au  Fact Sheet for Healthcare Providers: IncredibleEmployment.be  This test is not yet approved or cleared by the [place] FDA and has been authorized for detection and/or diagnosis of SARS-CoV-2 by FDA under an Emergency Use Authorization (EUA). This EUA will remain in effect (meaning this test can be used) for the duration of the COVID-19 declaration under Section 564(b)(1) of the Act, 21 U.S.C. section 360bbb-3(b)(1), unless the authorization is  terminated or revoked.  Performed at [facility], [place] [address] [place], [place] [address]   C Difficile Quick Screen w PCR reflex     Status: None   Collection Time: 05/11/21  3:20 PM   Specimen: STOOL  Result  Value Ref Range Status   C Diff antigen NEGATIVE NEGATIVE Final   C Diff toxin NEGATIVE NEGATIVE Final   C Diff interpretation No C. difficile detected.  Final    Comment: Performed at [facility], [place] [address], [place], [place] [address]         Radiology Studies: No results found.      Scheduled Meds:  (feeding supplement) PROSource Plus  30 mL Oral BID BM   vitamin C  500 mg Oral Daily   atorvastatin  40 mg Oral Daily   carvedilol  25 mg Oral BID WC   [START ON 05/23/2021] darbepoetin (ARANESP) injection - DIALYSIS  150 mcg Intravenous Q Tue-HD   doxercalciferol  2 mcg Intravenous Q T,Th,Sa-HD   furosemide  80 mg Oral Daily   hydrALAZINE  50 mg Oral TID   insulin aspart  0-5 Units Subcutaneous QHS   insulin aspart  0-9 Units Subcutaneous TID WC   losartan  100 mg Oral Daily   melatonin  3 mg Oral QHS   multivitamin with minerals  1 tablet Oral Daily   NIFEdipine  90 mg Oral Daily   predniSONE  60 mg Oral Daily   sertraline  100 mg Oral Daily   zinc sulfate  220 mg Oral Daily     LOS: 7 days    Time spent: 35 minutes    Geradine Girt, DO [facility]  If 7PM-7AM, please contact night-coverage [email or web address] 05/16/2021, 3:28 PM

## 2021-05-16 NOTE — Care Management Important Message (Signed)
Important Message  Patient Details  Name: Christine Gibson MRN: [id number] Date of Birth: [birth year]   Medicare Important Message Given:  Yes - Important Message mailed due to current National Emergency  Verbal consent obtained due to current National Emergency  Relationship to patient: Self Contact Name: Danyiel Call Date: 05/16/21  Time: 1246 Phone: [phone] Outcome: No Answer/Busy Important Message mailed to: Patient address on file    Delorse Lek 05/16/2021, 12:46 PM

## 2021-05-16 NOTE — NC FL2 (Signed)
[place] LEVEL OF CARE SCREENING TOOL     IDENTIFICATION  Patient Name: Christine Gibson Birthdate: 1950/11/27 Sex: female Admission Date (Current Location): 05/09/2021  [facility] and [place] Number:  Herbalist and Address:  [facility], [place] [address] [place], [place] [address]      Provider Number: [id number]  Attending Physician Name and Address:  Geradine Girt, DO  Relative Name and Phone Number:       Current Level of Care: SNF Recommended Level of Care: [place] (Long term care) Prior Approval Number:    Date Approved/Denied:   PASRR Number: [id number]  Discharge Plan: SNF    Current Diagnoses: Patient Active Problem List   Diagnosis Date Noted   COVID-19 virus infection 05/13/2021   ESRD needing dialysis ([place]) 05/13/2021   Acute CVA (cerebrovascular accident) ([place]) 05/09/2021   Thrombocytopenia ([place])    Iron deficiency anemia 01/06/2021   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/2021   CKD stage 5 secondary to hypertension ([place]) 07/29/2016   HLD (hyperlipidemia) 07/19/2015   HTN (hypertension) 07/19/2015   DM type 2 (diabetes mellitus, type 2) ([place]) 07/19/2015   CLL (chronic lymphocytic leukemia) ([place])     Orientation RESPIRATION BLADDER Height & Weight     Self, Time, Situation, Place  O2 (see d/c summary for oxygen needs)  (Anuria) Weight: 45.3 kg Height:  5' (152.4 cm)  BEHAVIORAL SYMPTOMS/MOOD NEUROLOGICAL BOWEL NUTRITION STATUS      Continent Diet (Renal diet with 1200 cc FR)  AMBULATORY STATUS COMMUNICATION OF NEEDS Skin   Extensive Assist Verbally  (cracking at perineum/ masses to anus area (mets))                       Personal Care Assistance Level of Assistance  Bathing, Feeding, Dressing Bathing Assistance: Maximum assistance Feeding assistance: Independent Dressing Assistance: Limited assistance     Functional Limitations Info  Sight, Hearing, Speech Sight Info: Adequate Hearing Info:  Impaired Speech Info: Adequate    SPECIAL CARE FACTORS FREQUENCY                       Contractures Contractures Info: Not present    Additional Factors Info  Code Status, Allergies, Psychotropic, Insulin Sliding Scale Code Status Info: Full Allergies Info: Lisinopril Psychotropic Info: Melatonin 3 mg at bedtime Insulin Sliding Scale Info: Novolog 0-5 units SQ at bedtime/ Novolog 0-9 units SQ with meals       Current Medications (05/16/2021):  This is the current hospital active medication list Current Facility-Administered Medications  Medication Dose Route Frequency Provider Last Rate Last Admin   (feeding supplement) PROSource Plus liquid 30 mL  30 mL Oral BID BM Loren Racer, PA-C   30 mL at 05/11/21 1121   acetaminophen (TYLENOL) tablet 650 mg  650 mg Oral Q6H PRN Shela Leff, MD   650 mg at 05/14/21 1508   albuterol (PROVENTIL) (2.5 MG/3ML) 0.083% nebulizer solution 3 mL  3 mL Inhalation Q6H PRN Darliss Cheney, MD       ascorbic acid (VITAMIN C) tablet 500 mg  500 mg Oral Daily Villas, Carole N, DO   500 mg at 05/16/21 1000   atorvastatin (LIPITOR) tablet 40 mg  40 mg Oral Daily Ruta Hinds S, NP   40 mg at 05/16/21 1000   benzonatate (TESSALON) capsule 100 mg  100 mg Oral TID PRN Darliss Cheney, MD   100 mg at 05/14/21  0940   carvedilol (COREG) tablet 25 mg  25 mg Oral BID WC Darliss Cheney, MD   25 mg at 05/15/21 1747   [START ON 05/23/2021] Darbepoetin Alfa (ARANESP) injection 150 mcg  150 mcg Intravenous Q Tue-HD Roney Jaffe, MD       doxercalciferol (HECTOROL) injection 2 mcg  2 mcg Intravenous Q T,Th,Sa-HD Loren Racer, PA-C       furosemide (LASIX) tablet 80 mg  80 mg Oral Daily Pahwani, Einar Grad, MD   80 mg at 05/16/21 0959   guaiFENesin-dextromethorphan (ROBITUSSIN DM) 100-10 MG/5ML syrup 10 mL  10 mL Oral Q4H PRN Irene Pap N, DO   10 mL at 05/15/21 2204   hydrALAZINE (APRESOLINE) injection 10 mg  10 mg Intravenous Q6H PRN Darliss Cheney, MD    10 mg at 05/15/21 0511   hydrALAZINE (APRESOLINE) tablet 50 mg  50 mg Oral TID Darliss Cheney, MD   50 mg at 05/15/21 2204   insulin aspart (novoLOG) injection 0-5 Units  0-5 Units Subcutaneous QHS Irene Pap N, DO   5 Units at 05/16/21 0100   insulin aspart (novoLOG) injection 0-9 Units  0-9 Units Subcutaneous TID WC Irene Pap N, DO   2 Units at 05/16/21 1243   Ipratropium-Albuterol (COMBIVENT) respimat 1 puff  1 puff Inhalation Q6H PRN Donato Heinz, MD   1 puff at 05/15/21 2200   labetalol (NORMODYNE) injection 5 mg  5 mg Intravenous Q2H PRN Irene Pap N, DO       losartan (COZAAR) tablet 100 mg  100 mg Oral Daily Pahwani, Einar Grad, MD   100 mg at 05/15/21 1144   meclizine (ANTIVERT) tablet 25 mg  25 mg Oral TID PRN Darliss Cheney, MD       melatonin tablet 3 mg  3 mg Oral QHS Pahwani, Einar Grad, MD   3 mg at 05/15/21 2204   multivitamin with minerals tablet 1 tablet  1 tablet Oral Daily Irene Pap N, DO   1 tablet at 05/16/21 1000   NIFEdipine (PROCARDIA XL/NIFEDICAL-XL) 24 hr tablet 90 mg  90 mg Oral Daily Pahwani, Einar Grad, MD   90 mg at 05/15/21 1145   oxyCODONE (Oxy IR/ROXICODONE) immediate release tablet 5 mg  5 mg Oral Q4H PRN Shela Leff, MD   5 mg at 05/15/21 2204   predniSONE (DELTASONE) tablet 60 mg  60 mg Oral Daily Pahwani, Einar Grad, MD   60 mg at 05/16/21 1000   sertraline (ZOLOFT) tablet 100 mg  100 mg Oral Daily Darliss Cheney, MD   100 mg at 05/16/21 0959   zinc sulfate capsule 220 mg  220 mg Oral Daily Irene Pap N, DO   220 mg at 05/16/21 5681     Discharge Medications: Please see discharge summary for a list of discharge medications.  Relevant Imaging Results:  Relevant Lab Results:   Additional Information SS#: [id number]--[facility] is HD patient. [facility] to pay for transport to HD. [facility] will cover the cost of SNF stay--has been approved  Pollie Friar, RN

## 2021-05-16 NOTE — Progress Notes (Addendum)
Inpatient Diabetes Program Recommendations  AACE/ADA: New Consensus Statement on Inpatient Glycemic Control (2015)  Target Ranges:  Prepandial:   less than 140 mg/dL      Peak postprandial:   less than 180 mg/dL (1-2 hours)      Critically ill patients:  140 - 180 mg/dL   Lab Results  Component Value Date   GLUCAP 111 (H) 05/16/2021   HGBA1C 6.0 (H) 05/10/2021    Review of Glycemic Control Results for Christine Gibson, Christine Gibson (MRN [id number]) as of 05/16/2021 08:58  Ref. Range 05/15/2021 06:17 05/15/2021 11:36 05/15/2021 17:11 05/15/2021 17:43 05/15/2021 21:27 05/16/2021 03:44 05/16/2021 06:11  Glucose-Capillary Latest Ref Range: 70 - 99 mg/dL 143 (H) 105 (H)  Prednisone given late due to HD 565 (HH) 571 (HH) 531 (HH) 168 (H) 111 (H)    Diabetes history: DM 2 Outpatient Diabetes medications: diet controlled Current orders for Inpatient glycemic control:  Novolog 0-9 units tid + hs  Inpatient Diabetes Program Recommendations:    On PO prednisone 60 mg Daily Glucose trends increase after steroid dose. If pt remains on same dose of prednisone consider:  -  Adding Novolog 4 units tid meal coverage if eating >50% of meals.  Thanks,  Tama Headings RN, MSN, BC-ADM Inpatient Diabetes Coordinator Team Pager [phone] (8a-5p)

## 2021-05-16 NOTE — Progress Notes (Signed)
Patient ID: Stephens Shire, female   DOB: 09-Jul-1951, 70 y.o.   MRN: [id number] S: seen in room, no c/o's , up in the chair     O:BP (!) 187/67 (BP Location: Right Arm)   Pulse 76   Temp 98.7 F (37.1 C) (Oral)   Resp 19   Ht 5' (1.524 m)   Wt 45.3 kg   LMP  (LMP Unknown)   SpO2 100%   BMI 19.50 kg/m   Intake/Output Summary (Last 24 hours) at 05/16/2021 1302 Last data filed at 05/16/2021 0941 Gross per 24 hour  Intake 117 ml  Output --  Net 117 ml    Intake/Output: No intake/output data recorded.  Intake/Output this shift:  Total I/O In: 117 [P.O.:117] Out: -  Weight change:  Physical exam: GEN: Hard of hearing, NAD. Nasal O2 in place. CV: RRR PULM: CTAB EXTREM: No LE edema ACCESS: LUE AVF + bruit with whistle    Recent Labs  Lab 05/10/21 0205 05/11/21 0122 05/12/21 1036 05/13/21 0349 05/15/21 0419 05/15/21 1805 05/15/21 2228 05/16/21 0142  NA 141 139 137 139 138  --   --  134*  K 4.5 4.6 3.3* 4.3 3.8  --   --  4.1  CL 105 100 98 101 99  --   --  96*  CO2 23 25 27 25 28   --   --  25  GLUCOSE 173* 163* 278* 54* 170* 650* 460* 315*  BUN 52* 39* 32* 61* 52*  --   --  80*  CREATININE 6.66* 4.57* 3.21* 4.57* 4.10*  --   --  5.31*  ALBUMIN 2.6* 2.7* 2.8*  --   --   --   --   --   CALCIUM 8.3* 8.6* 8.2* 8.4* 8.1*  --   --  7.7*  PHOS 5.5* 5.4* 3.6  --   --   --   --   --     No results for input(s): IRON, TIBC, TRANSFERRIN, FERRITIN in the last 72 hours.  Studies/Results: No results found.  (feeding supplement) PROSource Plus  30 mL Oral BID BM   vitamin C  500 mg Oral Daily   atorvastatin  40 mg Oral Daily   carvedilol  25 mg Oral BID WC   doxercalciferol  2 mcg Intravenous Q T,Th,Sa-HD   furosemide  80 mg Oral Daily   hydrALAZINE  50 mg Oral TID   insulin aspart  0-5 Units Subcutaneous QHS   insulin aspart  0-9 Units Subcutaneous TID WC   losartan  100 mg Oral Daily   melatonin  3 mg Oral QHS   multivitamin with minerals  1 tablet Oral Daily    NIFEdipine  90 mg Oral Daily   predniSONE  60 mg Oral Daily   sertraline  100 mg Oral Daily   zinc sulfate  220 mg Oral Daily    OP HD: TTS [facility]  3.5h  450/500  44.5kg   3K/2.5 bath  AVG  Hep NONE (due to low plts) - Hectoral 78mcg IV q HD - Mircera 153mcg IV q 2 weeks (last 7/9)   Assessment/Plan:  Acute CVA: new acute L thalamocapsular infarct, per neuro.  COVID positive: with new hypoxia and CXR infiltrate, treated w/ IV solu-medrol and remdesivir.   ESRD:  Continue HD per TTS schedule. No heparin HD due to low plts. HD today    Hypertension/volume: permissive HTN no longer needed per Neurology. 1kg over dry wt today, no vol^ on  exam, UF 1 L as tol  Anemia: Hgb 8's now,  ESA due on 7/23, will order darbe 150ug weekly on Tuesday while here.   Metabolic bone disease: Ca/Phos ok, continue home binders and VDRA.  Nutrition:  Alb low, will add supplements.  CLL/thrombocytopenia - platelets up slightly to 28, no heparin with HD.  T2DM   [name], MD 05/16/2021, 1:02 PM

## 2021-05-16 NOTE — Progress Notes (Signed)
Physical Therapy Treatment Patient Details Name: Christine Gibson MRN: [id number] DOB: 24-Mar-1951 Today's Date: 05/16/2021    History of Present Illness 70 y.o. F admitted to [facility] due to R sided weakness and difficulty ambulating. MRI on 7/20 shows small acute L thalamocapsular infarct. COVID +. Prior medical history significant for ESRD on HD on TTS, CVA, CLL, thrombocytopenia, anemia of chronic disease. Pt reports that she is on Hospice, confirmed with husband.    PT Comments    Pt showing much improvement since last session, appropriate with conversation though voice quality low. Pt ambulated 80' with RW and min-guard A with 2 seated rest breaks. SpO2 remained 98% on RA. Pt performed seated and standing dynamic balance activities. Pt relays buttocks pain from sitting in recliner so assisted pt back to bed end of session and encouraged her to get back up with nursing to chair for dinner. PT will continue to follow.    Follow Up Recommendations  Home health PT;Supervision/Assistance - 24 hour     Equipment Recommendations  3in1 (PT)    Recommendations for Other Services       Precautions / Restrictions Precautions Precautions: Fall Precaution Comments: COVID; monitor SpO2 Restrictions Weight Bearing Restrictions: No    Mobility  Bed Mobility Overal bed mobility: Needs Assistance Bed Mobility: Sit to Supine       Sit to supine: Min guard   General bed mobility comments: pt able to bring LE's into bed, min-guard for safety    Transfers Overall transfer level: Needs assistance Equipment used: Rolling walker (2 wheeled) Transfers: Sit to/from Stand Sit to Stand: Min guard         General transfer comment: Min guard assist for safety, cuing for hand placement, from recliner, chair, and toilet  Ambulation/Gait Ambulation/Gait assistance: Min assist Gait Distance (Feet): 50 Feet Assistive device: Rolling walker (2 wheeled) Gait Pattern/deviations: Step-through  pattern;Narrow base of support;Decreased stride length;Trunk flexed Gait velocity: decreased Gait velocity interpretation: <1.8 ft/sec, indicate of risk for recurrent falls General Gait Details: pt doing much better with RW mgmt today, able to turn multiple times with supervision as well as navigate tight spaces and enter/ exit bathroom. Short step length   Stairs             Wheelchair Mobility    Modified Rankin (Stroke Patients Only) Modified Rankin (Stroke Patients Only) Pre-Morbid Rankin Score: Moderately severe disability Modified Rankin: Moderately severe disability     Balance Overall balance assessment: History of Falls;Needs assistance Sitting-balance support: Single extremity supported;Feet supported Sitting balance-Leahy Scale: Good Sitting balance - Comments: able to maintain dynamic balance EOB and edge of chair   Standing balance support: During functional activity;No upper extremity supported Standing balance-Leahy Scale: Fair Standing balance comment: able to maintain static stance without support, needs UE support for dynamics                            Cognition Arousal/Alertness: Awake/alert Behavior During Therapy: WFL for tasks assessed/performed Overall Cognitive Status: Impaired/Different from baseline Area of Impairment: Attention;Following commands;Safety/judgement;Memory;Awareness;Problem solving                   Current Attention Level: Selective Memory: Decreased short-term memory;Decreased recall of precautions Following Commands: Follows one step commands inconsistently;Follows one step commands with increased time Safety/Judgement: Decreased awareness of deficits;Decreased awareness of safety Awareness: Emergent Problem Solving: Slow processing;Requires tactile cues;Requires verbal cues;Difficulty sequencing General Comments: pt with low voice, appropriate with  conversation today but decreased insight into deficits.       Exercises      General Comments General comments (skin integrity, edema, etc.): SpO2 98% on RA,      Pertinent Vitals/Pain Pain Assessment: Faces Faces Pain Scale: Hurts little more Pain Location: buttocks from sitting Pain Descriptors / Indicators: Grimacing Pain Intervention(s): Monitored during session    Home Living                      Prior Function            PT Goals (current goals can now be found in the care plan section) Acute Rehab PT Goals Patient Stated Goal: to walk PT Goal Formulation: With patient Time For Goal Achievement: 05/24/21 Potential to Achieve Goals: Good Progress towards PT goals: Progressing toward goals    Frequency    Min 3X/week      PT Plan Discharge plan needs to be updated    Co-evaluation              AM-PAC PT "6 Clicks" Mobility   Outcome Measure  Help needed turning from your back to your side while in a flat bed without using bedrails?: None Help needed moving from lying on your back to sitting on the side of a flat bed without using bedrails?: A Little Help needed moving to and from a bed to a chair (including a wheelchair)?: A Little Help needed standing up from a chair using your arms (e.g., wheelchair or bedside chair)?: A Little Help needed to walk in hospital room?: A Little Help needed climbing 3-5 steps with a railing? : A Lot 6 Click Score: 18    End of Session Equipment Utilized During Treatment: Gait belt Activity Tolerance: Patient tolerated treatment well Patient left: with call bell/phone within reach;in bed;with bed alarm set Nurse Communication: Mobility status PT Visit Diagnosis: Unsteadiness on feet (R26.81);Difficulty in walking, not elsewhere classified (R26.2);Other abnormalities of gait and mobility (R26.89)     Time: 7106-2694 PT Time Calculation (min) (ACUTE ONLY): 28 min  Charges:  $Gait Training: 23-37 mins                     Leighton Roach, Major  Pager [phone] Office [place] 05/16/2021, 3:39 PM

## 2021-05-16 NOTE — Plan of Care (Signed)
Patient is alert oriented x 4.  Pt is HD, anuric. PRN pain medication given for pain to rectum.   Problem: Education: Goal: Knowledge of General Education information will improve Description: Including pain rating scale, medication(s)/side effects and non-pharmacologic comfort measures Outcome: Progressing   Problem: Health Behavior/Discharge Planning: Goal: Ability to manage health-related needs will improve Outcome: Progressing   Problem: Clinical Measurements: Goal: Ability to maintain clinical measurements within normal limits will improve Outcome: Progressing Goal: Will remain free from infection Outcome: Progressing Goal: Diagnostic test results will improve Outcome: Progressing Goal: Respiratory complications will improve Outcome: Progressing Goal: Cardiovascular complication will be avoided Outcome: Progressing   Problem: Activity: Goal: Risk for activity intolerance will decrease Outcome: Progressing   Problem: Nutrition: Goal: Adequate nutrition will be maintained Outcome: Progressing   Problem: Coping: Goal: Level of anxiety will decrease Outcome: Progressing   Problem: Elimination: Goal: Will not experience complications related to bowel motility Outcome: Progressing Goal: Will not experience complications related to urinary retention Outcome: Progressing   Problem: Pain Managment: Goal: General experience of comfort will improve Outcome: Progressing   Problem: Safety: Goal: Ability to remain free from injury will improve Outcome: Progressing   Problem: Skin Integrity: Goal: Risk for impaired skin integrity will decrease Outcome: Progressing

## 2021-05-17 DIAGNOSIS — Z992 Dependence on renal dialysis: Secondary | ICD-10-CM

## 2021-05-17 DIAGNOSIS — N186 End stage renal disease: Secondary | ICD-10-CM | POA: Diagnosis not present

## 2021-05-17 DIAGNOSIS — C9110 Chronic lymphocytic leukemia of B-cell type not having achieved remission: Secondary | ICD-10-CM

## 2021-05-17 DIAGNOSIS — U071 COVID-19: Secondary | ICD-10-CM | POA: Diagnosis not present

## 2021-05-17 DIAGNOSIS — E119 Type 2 diabetes mellitus without complications: Secondary | ICD-10-CM | POA: Diagnosis not present

## 2021-05-17 LAB — GLUCOSE, RANDOM: Glucose, Bld: 425 mg/dL — ABNORMAL HIGH (ref 70–99)

## 2021-05-17 LAB — GLUCOSE, CAPILLARY
Glucose-Capillary: 152 mg/dL — ABNORMAL HIGH (ref 70–99)
Glucose-Capillary: 204 mg/dL — ABNORMAL HIGH (ref 70–99)
Glucose-Capillary: 241 mg/dL — ABNORMAL HIGH (ref 70–99)
Glucose-Capillary: 443 mg/dL — ABNORMAL HIGH (ref 70–99)

## 2021-05-17 MED ORDER — INSULIN ASPART 100 UNIT/ML IJ SOLN
7.0000 [IU] | Freq: Three times a day (TID) | INTRAMUSCULAR | Status: DC
  Administered 2021-05-17 – 2021-05-19 (×4): 7 [IU] via SUBCUTANEOUS

## 2021-05-17 MED ORDER — INSULIN ASPART 100 UNIT/ML IJ SOLN
6.0000 [IU] | Freq: Once | INTRAMUSCULAR | Status: AC
  Administered 2021-05-17: 6 [IU] via SUBCUTANEOUS

## 2021-05-17 NOTE — Progress Notes (Signed)
PROGRESS NOTE    Christine Gibson  ALP:[id number] DOB: 1951-10-12 DOA: 05/09/2021 PCP: Jearld Fenton, NP   Brief Narrative:   Christine Gibson is a 70 y.o. female with medical history significant for ESRD on HD on TTS, CVA, CLL, thrombocytopenia, chronic hypoxic respiratory failure requiring 2 L of oxygen, anemia of chronic disease, who presented to [facility] ED with complaints of R sided weakness and difficulty ambulating x 36 hours.  MRI brain revealed small acute left thalamocapsular infarct.  Faint edema without mass-effect.  Seen by neurology who recommended hospitalist admission for stroke work-up.  Work up complete  -She is currently awaiting SNF placement.  Looking at [facility] facilities per [facility].  Assessment & Plan:   Active Problems:   CLL (chronic lymphocytic leukemia) (HCC)   HLD (hyperlipidemia)   HTN (hypertension)   DM type 2 (diabetes mellitus, type 2) (HCC)   Anal warts   Acute CVA (cerebrovascular accident) ([place])   COVID-19 virus infection   ESRD needing dialysis ([place])   Acute CVA, POA: MRI brain showed small acute left thalamocapsular infarct without mass-effect. MRA head and neck negative for CVO.  Echo shows 50% ejection fraction, grade 1 diastolic dysfunction but no ASD.  -no antiplatelet due to severe thrombocytopenia.  Continue home medications which includes Coreg, hydralazine, losartan, Lasix   ESRD on HD TTS: Nephrology following.   Prediabetes: Hemoglobin A1c 6.0.  Diet controlled prior to admission.  Currently slightly hyperglycemic due to being on steroids.  Continue SSI.   Chronic hypoxic respiratory failure secondary to acute combined systolic and diastolic CHF: Patient uses 2 L of oxygen at home.  Echo shows 50% ejection fraction and grade 1 diastolic dysfunction. Elevated BNP greater than 3000.  Chest x-ray also shows pulmonary edema.  Continue hemodialysis.  Continue Lasix   COVID-19 viral infection Self-reported positive COVID-19 screening test 03/15/2021 at  outside facility Positive COVID test on 05/09/2021.  Completed 3 doses of remdesivir on 05/11/2021.  Continue prednisone as noted below per ENT   Hyperlipidemia: Continue Lipitor.   CLL/chronic thrombocytopenia: Stable.  No signs of bleeding.  Per neurology, not a candidate for any antiplatelet.  Continue to monitor CBC.   Anemia of chronic disease: Likely related to ESRD. Hemoglobin at baseline.  Monitor.   Sudden right-sided hearing loss: Per husband and patient, she lost her hearing suddenly on the right side about a week ago.  Likely not secondary to stroke since stroke is only lacunar and on the left side.  Per Dr. Doristine Bosworth, discussed with ENT Dr. Hayden Rasmussen and requested to see this patient however was informed that there would be no change in the management even when the patient is seen in the hospital and thus recommended to start this patient on prednisone 60 mg p.o. daily for 10 days and for patient to call ENT office [phone] to make an appointment on discharge.   DVT prophylaxis:SCDs Code Status: Full Family Communication:called husband Disposition Plan:  Status is: Inpatient  Remains inpatient appropriate because:Unsafe d/c plan  Dispo:  Patient From: Home  Planned Disposition: [place]  Medically stable for discharge: yes      Consultants:  Neurology Nephrology    Antimicrobials:  Anti-infectives (From admission, onward)    Start     Dose/Rate Route Frequency Ordered Stop   05/11/21 1000  remdesivir 100 mg in sodium chloride 0.9 % 100 mL IVPB  Status:  Discontinued       See Hyperspace for full Linked Orders Report.  100 mg 200 mL/hr over 30 Minutes Intravenous Daily 05/09/21 2326 05/10/21 1611   05/11/21 1000  remdesivir 100 mg in sodium chloride 0.9 % 100 mL IVPB       See Hyperspace for full Linked Orders Report.   100 mg 200 mL/hr over 30 Minutes Intravenous Daily 05/10/21 1611 05/12/21 0937   05/10/21 0030  remdesivir 200 mg in sodium  chloride 0.9% 250 mL IVPB       See Hyperspace for full Linked Orders Report.   200 mg 580 mL/hr over 30 Minutes Intravenous Once 05/09/21 2326 05/10/21 0105       Subjective: No SOB, no CP-- wants to go home  Objective: Vitals:   05/17/21 0322 05/17/21 0423 05/17/21 0858 05/17/21 1300  BP: (!) 161/62  (!) 182/109 (!) 171/82  Pulse:   (!) 102 75  Resp: 18  18 16   Temp: 98.8 F (37.1 C)  99.1 F (37.3 C) 99.2 F (37.3 C)  TempSrc: Oral  Oral Oral  SpO2:   99% 99%  Weight:  42.3 kg    Height:        Intake/Output Summary (Last 24 hours) at 05/17/2021 1531 Last data filed at 05/17/2021 0400 Gross per 24 hour  Intake 60 ml  Output 2000 ml  Net -1940 ml   Filed Weights   05/16/21 1930 05/16/21 2239 05/17/21 0423  Weight: 46.1 kg 42.3 kg 42.3 kg    Examination:   General: Appearance:    Thin female in no acute distress     Lungs:      respirations unlabored  Heart:    Normal heart rate.    MS:   All extremities are intact.    Neurologic:   Awake, alert      Data Reviewed: I have personally reviewed following labs and imaging studies  CBC: Recent Labs  Lab 05/11/21 0122 05/12/21 1036 05/13/21 0349 05/13/21 1059 05/14/21 0601 05/15/21 0419 05/16/21 0142  WBC 6.5 8.4 38.9* 14.1* 15.9* 14.2* 15.6*  NEUTROABS 3.2 4.2 10.0* 4.6 5.0  --   --   HGB 9.9* 9.4* 10.0* 9.1* 8.8* 8.1* 8.2*  HCT 30.9* 28.1* 30.2* 27.2* 27.0* 24.3* 24.6*  MCV 84.9 82.6 82.7 81.7 82.1 81.8 82.0  PLT 28* 39* 58* 39* 36* 38* 41*   Basic Metabolic Panel: Recent Labs  Lab 05/11/21 0122 05/12/21 1036 05/13/21 0349 05/15/21 0419 05/15/21 1805 05/15/21 2228 05/16/21 0142  NA 139 137 139 138  --   --  134*  K 4.6 3.3* 4.3 3.8  --   --  4.1  CL 100 98 101 99  --   --  96*  CO2 25 27 25 28   --   --  25  GLUCOSE 163* 278* 54* 170* 650* 460* 315*  BUN 39* 32* 61* 52*  --   --  80*  CREATININE 4.57* 3.21* 4.57* 4.10*  --   --  5.31*  CALCIUM 8.6* 8.2* 8.4* 8.1*  --   --  7.7*  MG  2.0 1.9  --   --   --   --   --   PHOS 5.4* 3.6  --   --   --   --   --    GFR: Estimated Creatinine Clearance: 6.6 mL/min (A) (by C-G formula based on SCr of 5.31 mg/dL (H)). Liver Function Tests: Recent Labs  Lab 05/11/21 0122 05/12/21 1036  ALBUMIN 2.7* 2.8*   No results for input(s): LIPASE, AMYLASE in the last  168 hours. No results for input(s): AMMONIA in the last 168 hours. Coagulation Profile: No results for input(s): INR, PROTIME in the last 168 hours. Cardiac Enzymes: No results for input(s): CKTOTAL, CKMB, CKMBINDEX, TROPONINI in the last 168 hours. BNP (last 3 results) No results for input(s): PROBNP in the last 8760 hours. HbA1C: No results for input(s): HGBA1C in the last 72 hours. CBG: Recent Labs  Lab 05/16/21 1238 05/16/21 1745 05/16/21 2342 05/17/21 0724 05/17/21 1258  GLUCAP 185* 311* 167* 152* 204*   Lipid Profile: No results for input(s): CHOL, HDL, LDLCALC, TRIG, CHOLHDL, LDLDIRECT in the last 72 hours. Thyroid Function Tests: No results for input(s): TSH, T4TOTAL, FREET4, T3FREE, THYROIDAB in the last 72 hours. Anemia Panel: No results for input(s): VITAMINB12, FOLATE, FERRITIN, TIBC, IRON, RETICCTPCT in the last 72 hours. Sepsis Labs: No results for input(s): PROCALCITON, LATICACIDVEN in the last 168 hours.  Recent Results (from the past 240 hour(s))  Resp Panel by RT-PCR (Flu A&B, Covid) Nasopharyngeal Swab     Status: Abnormal   Collection Time: 05/09/21  4:10 PM   Specimen: Nasopharyngeal Swab; Nasopharyngeal(NP) swabs in vial transport medium  Result Value Ref Range Status   SARS Coronavirus 2 by RT PCR POSITIVE (A) NEGATIVE Final    Comment: RESULT CALLED TO, READ BACK BY AND VERIFIED WITH: A BANKS RN [id number] 05/09/21 A BROWNING (NOTE) SARS-CoV-2 target nucleic acids are DETECTED.  The SARS-CoV-2 RNA is generally detectable in upper respiratory specimens during the acute phase of infection. Positive results are indicative of the presence  of the identified virus, but do not rule out bacterial infection or co-infection with other pathogens not detected by the test. Clinical correlation with patient history and other diagnostic information is necessary to determine patient infection status. The expected result is Negative.  Fact Sheet for Patients: EntrepreneurPulse.com.au  Fact Sheet for Healthcare Providers: IncredibleEmployment.be  This test is not yet approved or cleared by the [place] FDA and  has been authorized for detection and/or diagnosis of SARS-CoV-2 by FDA under an Emergency Use Authorization (EUA).  This EUA will remain in effect (meaning this test can b e used) for the duration of  the COVID-19 declaration under Section 564(b)(1) of the Act, 21 U.S.C. section 360bbb-3(b)(1), unless the authorization is terminated or revoked sooner.     Influenza A by PCR NEGATIVE NEGATIVE Final   Influenza B by PCR NEGATIVE NEGATIVE Final    Comment: (NOTE) The Xpert Xpress SARS-CoV-2/FLU/RSV plus assay is intended as an aid in the diagnosis of influenza from Nasopharyngeal swab specimens and should not be used as a sole basis for treatment. Nasal washings and aspirates are unacceptable for Xpert Xpress SARS-CoV-2/FLU/RSV testing.  Fact Sheet for Patients: EntrepreneurPulse.com.au  Fact Sheet for Healthcare Providers: IncredibleEmployment.be  This test is not yet approved or cleared by the [place] FDA and has been authorized for detection and/or diagnosis of SARS-CoV-2 by FDA under an Emergency Use Authorization (EUA). This EUA will remain in effect (meaning this test can be used) for the duration of the COVID-19 declaration under Section 564(b)(1) of the Act, 21 U.S.C. section 360bbb-3(b)(1), unless the authorization is terminated or revoked.  Performed at [facility], [place] [address], [place], [place] [address]   C  Difficile Quick Screen w PCR reflex     Status: None   Collection Time: 05/11/21  3:20 PM   Specimen: STOOL  Result Value Ref Range Status   C Diff antigen NEGATIVE NEGATIVE Final  C Diff toxin NEGATIVE NEGATIVE Final   C Diff interpretation No C. difficile detected.  Final    Comment: Performed at [facility], [place] [address], [place], [place] [address]         Radiology Studies: No results found.      Scheduled Meds:  (feeding supplement) PROSource Plus  30 mL Oral BID BM   vitamin C  500 mg Oral Daily   atorvastatin  40 mg Oral Daily   carvedilol  25 mg Oral BID WC   [START ON 05/23/2021] darbepoetin (ARANESP) injection - DIALYSIS  150 mcg Intravenous Q Tue-HD   doxercalciferol  2 mcg Intravenous Q T,Th,Sa-HD   furosemide  80 mg Oral Daily   hydrALAZINE  50 mg Oral TID   insulin aspart  0-5 Units Subcutaneous QHS   insulin aspart  0-9 Units Subcutaneous TID WC   losartan  100 mg Oral Daily   melatonin  3 mg Oral QHS   multivitamin with minerals  1 tablet Oral Daily   NIFEdipine  90 mg Oral Daily   predniSONE  60 mg Oral Daily   sertraline  100 mg Oral Daily   zinc sulfate  220 mg Oral Daily     LOS: 8 days    Time spent: 25 minutes    Geradine Girt, DO [facility]  If 7PM-7AM, please contact night-coverage [email or web address] 05/17/2021, 3:31 PM

## 2021-05-17 NOTE — Progress Notes (Signed)
  Speech Language Pathology Treatment: Cognitive-Linquistic  Patient Details Name: Christine Gibson MRN: [id number] DOB: 1950/11/24 Today's Date: 05/17/2021 Time: 3536-1443 SLP Time Calculation (min) (ACUTE ONLY): 17 min  Assessment / Plan / Recommendation Clinical Impression  Pt seen for skilled treatment of cognition focusing on orientation, attention and memory.  Pt oriented x4 with improved hearing acuity overall impacting auditory comprehension for following directives (multi-step) and answering personal/biographical questions accurately.  Pt stated "I am doing better" when asked during the tx session as pt exhibited difficulty with simple tasks during initial assessment.  This was initially difficult to discern if this was related to hearing acuity and/or overall auditory comprehension/cognition.  Pt stated she "is back to normal" when asked about cognition and hearing has improved as pt did not require repetition and/or gesturing/written cues this session, so she may benefit from f/u at next venue of care vs continued ST in acute setting.  ST will s/o at this time unless further needs arise.   HPI HPI: Christine Gibson is a 70 y.o. female with medical history significant for ESRD on HD on TTS, CVA, CLL, thrombocytopenia, anemia of chronic disease, who presented to [facility] ED with complaints of R sided weakness and difficulty ambulating x 36 hours.  History is obtained from the patient and her husband via phone, poor historians.  CT head was abnormal, revealing new 1.6 cm amorphous area of hyperdensity within the right cerebellar hemisphere.  MRI brain revealed small acute left thalamocapsular infarct.  Faint edema without mass-effect.  Seen by neurology who recommended hospitalist admission for stroke work-up.      SLP Plan  Discharge SLP treatment due to (comment) (goals met; may benefit from further f/u at next venue)       Recommendations      F/u at next venue for cognitive functioning if  pt does not appear to be at baseline             Oral Care Recommendations: Oral care BID Follow up Recommendations: Skilled Nursing facility SLP Visit Diagnosis: Cognitive communication deficit (X54.008) Plan: Discharge SLP treatment due to (comment) (goals met; may benefit from further f/u at next venue)                      Christine Gibson, M.S., [place] 05/17/2021, 2:43 PM

## 2021-05-17 NOTE — TOC Progression Note (Addendum)
Transition of Care [facility]) - Progression Note    Patient Details  Name: Christine Gibson MRN: [id number] Date of Birth: 1950-11-12  Transition of [facility] CM/SW Contact  Pollie Friar, RN Phone Number: 05/17/2021, 3:03 PM  Clinical Narrative:    CM has left voicemail for [facility]. Unable to leave message at [facility]. Left message for [facility] in [place]. Unable to leave message at [facility].  Message left for [facility] and rehab.  CM spoke to Mr Berton and he will be coming to the hospital today to see if he feels he can manage the patient at home.  TOC following.  Expected Discharge Plan: [place] Barriers to Discharge: Continued Medical Work up  Expected Discharge Plan and Services Expected Discharge Plan: [place] In-house Referral: Clinical Social Work Discharge Planning Services: CM Consult Post Acute Care Choice: [place] arrangements for the past 2 months: Single Family Home                                       Social Determinants of Health (SDOH) Interventions    Readmission Risk Interventions No flowsheet data found.

## 2021-05-17 NOTE — Plan of Care (Signed)
  Problem: Education: Goal: Knowledge of General Education information will improve Description: Including pain rating scale, medication(s)/side effects and non-pharmacologic comfort measures Outcome: Progressing   Problem: Health Behavior/Discharge Planning: Goal: Ability to manage health-related needs will improve Outcome: Progressing   Problem: Clinical Measurements: Goal: Ability to maintain clinical measurements within normal limits will improve Outcome: Progressing Goal: Will remain free from infection Outcome: Progressing Goal: Diagnostic test results will improve Outcome: Progressing Goal: Respiratory complications will improve Outcome: Progressing Goal: Cardiovascular complication will be avoided Outcome: Progressing   Problem: Skin Integrity: Goal: Risk for impaired skin integrity will decrease Outcome: Progressing   Problem: Safety: Goal: Ability to remain free from injury will improve Outcome: Progressing   Problem: Pain Managment: Goal: General experience of comfort will improve Outcome: Progressing   Problem: Elimination: Goal: Will not experience complications related to bowel motility Outcome: Progressing Goal: Will not experience complications related to urinary retention Outcome: Progressing   

## 2021-05-17 NOTE — Progress Notes (Addendum)
Physical Therapy Treatment Patient Details Name: Christine Gibson MRN: [id number] DOB: 1951-02-26 Today's Date: 05/17/2021    History of Present Illness Pt is 70 y.o. F admitted to [facility] due to R sided weakness and difficulty ambulating on 05/09/21. MRI on 7/20 shows small acute L thalamocapsular infarct. COVID +. Prior medical history significant for ESRD on HD on TTS, CVA, CLL, thrombocytopenia, anemia of chronic disease. Pt reports that she is on Hospice, confirmed with husband.    PT Comments    Pt making good progress.  Husband reports that he is with pt most of the time at home (occasional errand or goes to gym 3 x week, but otherwise home).  She does still demonstrate decreased mobility/strength but is ambulating household distances with RW. Tried without RW but mildly unstable.  Continue to recommend home with spouse, HHPT, and use of RW. VSS on RA.     Follow Up Recommendations  Home health PT;Supervision for mobility/OOB     Equipment Recommendations  3in1 (PT)    Recommendations for Other Services       Precautions / Restrictions Precautions Precautions: Fall    Mobility  Bed Mobility Overal bed mobility: Needs Assistance Bed Mobility: Supine to Sit     Supine to sit: Supervision;HOB elevated          Transfers Overall transfer level: Needs assistance Equipment used: Rolling walker (2 wheeled) Transfers: Sit to/from Stand Sit to Stand: Min guard         General transfer comment: Min guard assist for safety, performed x 3  Ambulation/Gait Ambulation/Gait assistance: Min guard Gait Distance (Feet): 80 Feet (80'x2) Assistive device: Rolling walker (2 wheeled);None Gait Pattern/deviations: Step-through pattern Gait velocity: decreased   General Gait Details: Pt demonstrating steady gait with RW and able to navigate in tight spaces.  Tried without AD and pt demonstrating mild instability.  REcommending using RW for home   Stairs              Wheelchair Mobility    Modified Rankin (Stroke Patients Only) Modified Rankin (Stroke Patients Only) Pre-Morbid Rankin Score: Moderate disability Modified Rankin: Moderately severe disability     Balance Overall balance assessment: History of Falls;Needs assistance Sitting-balance support: Feet supported;No upper extremity supported Sitting balance-Leahy Scale: Good Sitting balance - Comments: Sitting EOB to eat   Standing balance support: No upper extremity supported Standing balance-Leahy Scale: Good Standing balance comment: Could ambulate without AD but did demonstrate some instabilities but no overt LOB                            Cognition Arousal/Alertness: Awake/alert Behavior During Therapy: WFL for tasks assessed/performed Overall Cognitive Status: Impaired/Different from baseline                     Current Attention Level: Selective Memory: Decreased short-term memory Following Commands: Follows one step commands consistently   Awareness: Emergent          Exercises      General Comments General comments (skin integrity, edema, etc.): O2 sats 100% on RA      Pertinent Vitals/Pain Pain Assessment: No/denies pain Pain Score: 6  Pain Location: bottom Pain Descriptors / Indicators: Discomfort;Grimacing Pain Intervention(s): Limited activity within patient's tolerance;Monitored during session    Home Living                      Prior Function  PT Goals (current goals can now be found in the care plan section) Acute Rehab PT Goals Patient Stated Goal: to walk; go home PT Goal Formulation: With patient/family Time For Goal Achievement: 05/24/21 Potential to Achieve Goals: Good Progress towards PT goals: Progressing toward goals    Frequency    Min 3X/week      PT Plan Current plan remains appropriate    Co-evaluation              AM-PAC PT "6 Clicks" Mobility   Outcome Measure  Help  needed turning from your back to your side while in a flat bed without using bedrails?: None Help needed moving from lying on your back to sitting on the side of a flat bed without using bedrails?: A Little Help needed moving to and from a bed to a chair (including a wheelchair)?: A Little Help needed standing up from a chair using your arms (e.g., wheelchair or bedside chair)?: A Little Help needed to walk in hospital room?: A Little Help needed climbing 3-5 steps with a railing? : A Little 6 Click Score: 19    End of Session Equipment Utilized During Treatment: Gait belt Activity Tolerance: Patient tolerated treatment well Patient left: in bed;with call bell/phone within reach;with bed alarm set (sitting EOB - spouse nearby) Nurse Communication: Mobility status PT Visit Diagnosis: Unsteadiness on feet (R26.81);Difficulty in walking, not elsewhere classified (R26.2);Other abnormalities of gait and mobility (R26.89)     Time: 1645-1700 PT Time Calculation (min) (ACUTE ONLY): 15 min  Charges:  $Gait Training: 8-22 mins                     Abran Richard, PT Acute Rehab Services Pager [phone] [facility] [place] 05/17/2021, 5:08 PM

## 2021-05-17 NOTE — Progress Notes (Signed)
Patient ID: Christine Gibson, female   DOB: [birth year], 70 y.o.   MRN: [id number] S: Patient not seen directly today given COVID-19 + status, utilizing data taken from chart +/- discussions w/ providers and staff.     O:BP (!) 171/82 (BP Location: Right Arm)   Pulse 75   Temp 99.2 F (37.3 C) (Oral)   Resp 16   Ht 5' (1.524 m)   Wt 42.3 kg   LMP  (LMP Unknown)   SpO2 99%   BMI 18.21 kg/m   Intake/Output Summary (Last 24 hours) at 05/17/2021 1432 Last data filed at 05/17/2021 0400 Gross per 24 hour  Intake 60 ml  Output 2000 ml  Net -1940 ml    Intake/Output: I/O last 3 completed shifts: In: 177 [P.O.:177] Out: 2000 [Other:2000]  Intake/Output this shift:  No intake/output data recorded. Weight change:  Physical exam: Patient not seen directly today given COVID-19 + status, utilizing data taken from chart +/- discussions w/ providers and staff.      Recent Labs  Lab 05/11/21 0122 05/12/21 1036 05/13/21 0349 05/15/21 0419 05/15/21 1805 05/15/21 2228 05/16/21 0142  NA 139 137 139 138  --   --  134*  K 4.6 3.3* 4.3 3.8  --   --  4.1  CL 100 98 101 99  --   --  96*  CO2 25 27 25 28   --   --  25  GLUCOSE 163* 278* 54* 170* 650* 460* 315*  BUN 39* 32* 61* 52*  --   --  80*  CREATININE 4.57* 3.21* 4.57* 4.10*  --   --  5.31*  ALBUMIN 2.7* 2.8*  --   --   --   --   --   CALCIUM 8.6* 8.2* 8.4* 8.1*  --   --  7.7*  PHOS 5.4* 3.6  --   --   --   --   --     No results for input(s): IRON, TIBC, TRANSFERRIN, FERRITIN in the last 72 hours.  Studies/Results: No results found.  (feeding supplement) PROSource Plus  30 mL Oral BID BM   vitamin C  500 mg Oral Daily   atorvastatin  40 mg Oral Daily   carvedilol  25 mg Oral BID WC   [START ON 05/23/2021] darbepoetin (ARANESP) injection - DIALYSIS  150 mcg Intravenous Q Tue-HD   doxercalciferol  2 mcg Intravenous Q T,Th,Sa-HD   furosemide  80 mg Oral Daily   hydrALAZINE  50 mg Oral TID   insulin aspart  0-5 Units Subcutaneous  QHS   insulin aspart  0-9 Units Subcutaneous TID WC   losartan  100 mg Oral Daily   melatonin  3 mg Oral QHS   multivitamin with minerals  1 tablet Oral Daily   NIFEdipine  90 mg Oral Daily   predniSONE  60 mg Oral Daily   sertraline  100 mg Oral Daily   zinc sulfate  220 mg Oral Daily    OP HD: TTS [facility]  3.5h  450/500  44.5kg   3K/2.5 bath  AVG  Hep NONE (due to low plts) - Hectoral 41mcg IV q HD - Mircera 140mcg IV q 2 weeks (last 7/9)   Assessment/Plan:  Acute CVA: new acute L thalamocapsular infarct, per neuro.  COVID positive: with new hypoxia and CXR infiltrate, treated w/ IV solu-medrol and remdesivir.   ESRD:  Continue HD per TTS schedule. No heparin HD due to low plts. HD tomorrow.  Hypertension/volume: permissive HTN no longer needed per Neurology. 2kg under dry wt, no edema on exam. BP's up, is getting her 4 BP lowering meds here as at home.   Anemia: Hgb 8's now,  ESA due on 7/23, will order darbe 150ug weekly on Tuesday while here.   Metabolic bone disease: Ca/Phos ok, continue home binders and VDRA.  Nutrition:  Alb low, will add supplements.  CLL/thrombocytopenia - platelets up slightly to 28, no heparin with HD.  T2DM   [name], MD 05/17/2021, 2:32 PM

## 2021-05-18 DIAGNOSIS — C9110 Chronic lymphocytic leukemia of B-cell type not having achieved remission: Secondary | ICD-10-CM | POA: Diagnosis not present

## 2021-05-18 DIAGNOSIS — U071 COVID-19: Secondary | ICD-10-CM | POA: Diagnosis not present

## 2021-05-18 DIAGNOSIS — E119 Type 2 diabetes mellitus without complications: Secondary | ICD-10-CM | POA: Diagnosis not present

## 2021-05-18 LAB — GLUCOSE, CAPILLARY
Glucose-Capillary: 271 mg/dL — ABNORMAL HIGH (ref 70–99)
Glucose-Capillary: 114 mg/dL — ABNORMAL HIGH (ref 70–99)
Glucose-Capillary: 171 mg/dL — ABNORMAL HIGH (ref 70–99)
Glucose-Capillary: 251 mg/dL — ABNORMAL HIGH (ref 70–99)
Glucose-Capillary: 143 mg/dL — ABNORMAL HIGH (ref 70–99)

## 2021-05-18 LAB — CBC
HCT: 25.3 % — ABNORMAL LOW (ref 36.0–46.0)
Hemoglobin: 8.3 g/dL — ABNORMAL LOW (ref 12.0–15.0)
MCH: 27.4 pg (ref 26.0–34.0)
MCHC: 32.8 g/dL (ref 30.0–36.0)
MCV: 83.5 fL (ref 80.0–100.0)
Platelets: 44 10*3/uL — ABNORMAL LOW (ref 150–400)
RBC: 3.03 MIL/uL — ABNORMAL LOW (ref 3.87–5.11)
RDW: 19 % — ABNORMAL HIGH (ref 11.5–15.5)
WBC: 21.9 10*3/uL — ABNORMAL HIGH (ref 4.0–10.5)
nRBC: 0 % (ref 0.0–0.2)

## 2021-05-18 LAB — RENAL FUNCTION PANEL
Albumin: 2.6 g/dL — ABNORMAL LOW (ref 3.5–5.0)
Anion gap: 14 (ref 5–15)
BUN: 98 mg/dL — ABNORMAL HIGH (ref 8–23)
CO2: 22 mmol/L (ref 22–32)
Calcium: 7.5 mg/dL — ABNORMAL LOW (ref 8.9–10.3)
Chloride: 100 mmol/L (ref 98–111)
Creatinine, Ser: 5.93 mg/dL — ABNORMAL HIGH (ref 0.44–1.00)
GFR, Estimated: 7 mL/min — ABNORMAL LOW (ref >60)
Glucose, Bld: 118 mg/dL — ABNORMAL HIGH (ref 70–99)
Phosphorus: 5.4 mg/dL — ABNORMAL HIGH (ref 2.5–4.6)
Potassium: 4 mmol/L (ref 3.5–5.1)
Sodium: 136 mmol/L (ref 135–145)

## 2021-05-18 MED ORDER — DOXERCALCIFEROL 4 MCG/2ML IV SOLN
INTRAVENOUS | Status: AC
  Administered 2021-05-18: 2 ug via INTRAVENOUS
  Filled 2021-05-18: qty 2

## 2021-05-18 NOTE — Plan of Care (Signed)
  Problem: Education: Goal: Knowledge of risk factors and measures for prevention of condition will improve Outcome: Progressing   Problem: Coping: Goal: Psychosocial and spiritual needs will be supported Outcome: Progressing   Problem: Respiratory: Goal: Will maintain a patent airway Outcome: Progressing Goal: Complications related to the disease process, condition or treatment will be avoided or minimized Outcome: Progressing   Problem: Education: Goal: Knowledge of disease or condition will improve Outcome: Progressing Goal: Knowledge of secondary prevention will improve Outcome: Progressing Goal: Knowledge of patient specific risk factors addressed and post discharge goals established will improve Outcome: Progressing Goal: Individualized Educational Video(s) Outcome: Progressing

## 2021-05-18 NOTE — Progress Notes (Signed)
Patient ID: Christine Gibson, female   DOB: 04-14-1951, 70 y.o.   MRN: [id number] S: Patient not seen directly today given COVID-19 + status, utilizing data taken from chart +/- discussions w/ providers and staff.     O:BP (!) 148/76 (BP Location: Right Arm)   Pulse 80   Temp 98.6 F (37 C)   Resp 20   Ht 5' (1.524 m)   Wt 42 kg   LMP  (LMP Unknown)   SpO2 100%   BMI 18.08 kg/m  No intake or output data in the 24 hours ending 05/18/21 1302  Intake/Output: I/O last 3 completed shifts: In: 89 [P.O.:60] Out: 2000 [Other:2000]  Intake/Output this shift:  No intake/output data recorded. Weight change: -2.1 kg Physical exam: Patient not seen directly today given COVID-19 + status, utilizing data taken from chart +/- discussions w/ providers and staff.      Recent Labs  Lab 05/12/21 1036 05/13/21 0349 05/15/21 0419 05/15/21 1805 05/15/21 2228 05/16/21 0142 05/17/21 2229 05/18/21 0850  NA 137 139 138  --   --  134*  --  136  K 3.3* 4.3 3.8  --   --  4.1  --  4.0  CL 98 101 99  --   --  96*  --  100  CO2 27 25 28   --   --  25  --  22  GLUCOSE 278* 54* 170* 650* 460* 315* 425* 118*  BUN 32* 61* 52*  --   --  80*  --  98*  CREATININE 3.21* 4.57* 4.10*  --   --  5.31*  --  5.93*  ALBUMIN 2.8*  --   --   --   --   --   --  2.6*  CALCIUM 8.2* 8.4* 8.1*  --   --  7.7*  --  7.5*  PHOS 3.6  --   --   --   --   --   --  5.4*    No results for input(s): IRON, TIBC, TRANSFERRIN, FERRITIN in the last 72 hours.  Studies/Results: No results found.  (feeding supplement) PROSource Plus  30 mL Oral BID BM   vitamin C  500 mg Oral Daily   atorvastatin  40 mg Oral Daily   carvedilol  25 mg Oral BID WC   [START ON 05/23/2021] darbepoetin (ARANESP) injection - DIALYSIS  150 mcg Intravenous Q Tue-HD   doxercalciferol  2 mcg Intravenous Q T,Th,Sa-HD   furosemide  80 mg Oral Daily   hydrALAZINE  50 mg Oral TID   insulin aspart  0-5 Units Subcutaneous QHS   insulin aspart  0-9 Units  Subcutaneous TID WC   insulin aspart  7 Units Subcutaneous TID WC   losartan  100 mg Oral Daily   melatonin  3 mg Oral QHS   multivitamin with minerals  1 tablet Oral Daily   NIFEdipine  90 mg Oral Daily   sertraline  100 mg Oral Daily   zinc sulfate  220 mg Oral Daily    OP HD: TTS [facility]  3.5h  450/500  44.5kg   3K/2.5 bath  AVG  Hep NONE (due to low plts) - Hectoral 48mcg IV q HD - Mircera 151mcg IV q 2 weeks (last 7/9)   Assessment/Plan:  Acute CVA: new acute L thalamocapsular infarct, per neuro.  COVID positive: with new hypoxia and CXR infiltrate, treated w/ IV solu-medrol and remdesivir. Is now on RA and off O2.   ESRD:  HD per TTS schedule. No heparin HD due to low plts. HD today  Hypertension/volume: permissive HTN no longer needed per Neurology. No edema on exam.  BP's up, is getting her 4 BP lowering meds here as at home. Wt's down 2-3kg.   Anemia: Hgb 8's now,  ESA due on 7/23. Ordered darbe 150ug weekly on Tuesdays while here.   Metabolic bone disease: Ca/Phos ok, continue home binders and VDRA.  Nutrition:  Alb low, will add supplements.  CLL/thrombocytopenia - platelets up slightly to 28, no heparin with HD.  T2DM   [name], MD 05/18/2021, 1:02 PM

## 2021-05-18 NOTE — Progress Notes (Signed)
Occupational Therapy Treatment Patient Details Name: Christine Gibson MRN: [id number] DOB: Oct 11, 1951 Today's Date: 05/18/2021    History of present illness Pt is 70 y.o. F admitted to [facility] due to R sided weakness and difficulty ambulating on 05/09/21. MRI on 7/20 shows small acute L thalamocapsular infarct. COVID +. Prior medical history significant for ESRD on HD on TTS, CVA, CLL, thrombocytopenia, anemia of chronic disease. Pt reports that she is on Hospice, confirmed with husband.   OT comments  Patient with nice progress toward patient focused OT goals.  Despite patient receiving dialysis and prior Pt session, she is up and walking with RW with up to supervision.  She completed upper bathing, grooming while standing with supervision.  Patient able to remove socks seated with no assist, and lied back in bed with supervision.  Patient's discharge plan updated to home with initial 24 hour assist as needed and home health OT.  OT will continue to follow in the acute setting to maximize her functional status.    Follow Up Recommendations  Home health OT;Supervision - Intermittent    Equipment Recommendations  None recommended by OT    Recommendations for Other Services      Precautions / Restrictions Precautions Precautions: Fall Restrictions Weight Bearing Restrictions: No       Mobility Bed Mobility Overal bed mobility: Needs Assistance Bed Mobility: Sit to Supine       Sit to supine: Supervision   General bed mobility comments: in chair at arrival    Transfers Overall transfer level: Needs assistance Equipment used: Rolling walker (2 wheeled) Transfers: Sit to/from Stand Sit to Stand: Supervision         General transfer comment: Performed x 3 during session, had pt place L foot forward for strengthening on R LE with standing    Balance Overall balance assessment: History of Falls;Needs assistance Sitting-balance support: Feet supported;No upper extremity  supported Sitting balance-Leahy Scale: Good     Standing balance support: Bilateral upper extremity supported Standing balance-Leahy Scale: Good Standing balance comment: had patient use RW for in room mobility.                           ADL either performed or assessed with clinical judgement   ADL       Grooming: Supervision/safety;Standing                   Toilet Transfer: Chief Operating Officer;Ambulation   Toileting- Clothing Manipulation and Hygiene: Supervision/safety;Sit to/from stand       Functional mobility during ADLs: Supervision/safety;Rolling walker                         Cognition Arousal/Alertness: Awake/alert Behavior During Therapy: WFL for tasks assessed/performed Overall Cognitive Status: Impaired/Different from baseline                     Current Attention Level: Selective Memory: Decreased short-term memory Following Commands: Follows multi-step commands inconsistently;Follows one step commands consistently   Awareness: Anticipatory Problem Solving: Slow processing          Exercises General Exercises - Lower Extremity Ankle Circles/Pumps: AROM;Both;10 reps;Supine Long Arc Quad: AROM;Both;10 reps;Seated Heel Slides: AROM;Both;10 reps;Supine Hip ABduction/ADduction: AROM;Both;10 reps;Supine Hip Flexion/Marching: AROM;10 reps;Seated;Both Other Exercises Other Exercises: cues for full ROM on R   Shoulder Instructions       General Comments On RA with sats 99%    Pertinent  Vitals/ Pain       Pain Assessment: Faces Faces Pain Scale: Hurts a little bit Pain Location: bottom Pain Descriptors / Indicators: Discomfort;Grimacing Pain Intervention(s): Monitored during session                                                          Frequency  Min 2X/week        Progress Toward Goals  OT Goals(current goals can now be found in the care plan section)   Progress towards OT goals: Progressing toward goals  Acute Rehab OT Goals Patient Stated Goal: feel better and return home OT Goal Formulation: With patient Time For Goal Achievement: 05/24/21 Potential to Achieve Goals: Good  Plan Discharge plan needs to be updated    Co-evaluation                 AM-PAC OT "6 Clicks" Daily Activity     Outcome Measure   Help from another person eating meals?: None Help from another person taking care of personal grooming?: None Help from another person toileting, which includes using toliet, bedpan, or urinal?: A Little Help from another person bathing (including washing, rinsing, drying)?: A Little Help from another person to put on and taking off regular upper body clothing?: A Little Help from another person to put on and taking off regular lower body clothing?: A Little 6 Click Score: 20    End of Session Equipment Utilized During Treatment: Rolling walker  OT Visit Diagnosis: Unsteadiness on feet (R26.81);Other abnormalities of gait and mobility (R26.89);Muscle weakness (generalized) (M62.81);History of falling (Z91.81)   Activity Tolerance Patient tolerated treatment well   Patient Left in bed;with call bell/phone within reach;with bed alarm set   Nurse Communication Other (comment) (patient back in bed)        Time: 4665-9935 OT Time Calculation (min): 24 min  Charges: OT General Charges $OT Visit: 1 Visit OT Treatments $Self Care/Home Management : 23-37 mins  05/18/2021  Rich, OTR/L  Acute Rehabilitation Services  Office:  [place] 05/18/2021, 5:07 PM

## 2021-05-18 NOTE — Progress Notes (Addendum)
Physical Therapy Treatment Patient Details Name: Christine Gibson MRN: [id number] DOB: 01-20-1951 Today's Date: 05/18/2021    History of Present Illness Pt is 70 y.o. F admitted to [facility] due to R sided weakness and difficulty ambulating on 05/09/21. MRI on 7/20 shows small acute L thalamocapsular infarct. COVID +. Prior medical history significant for ESRD on HD on TTS, CVA, CLL, thrombocytopenia, anemia of chronic disease. Pt reports that she is on Hospice, confirmed with husband.    PT Comments    Pt making gradual progress. She did have HD this am and was fatigued but still participating well with therapy.  Min cues for full ROM with exercises and for RW with gait.  Continues to demonstrate mild R sided weakness.     .  Follow Up Recommendations  Home health PT;Supervision for mobility/OOB     Equipment Recommendations  3in1 (PT)    Recommendations for Other Services       Precautions / Restrictions Precautions Precautions: Fall    Mobility  Bed Mobility               General bed mobility comments: in chair at arrival    Transfers Overall transfer level: Needs assistance Equipment used: Rolling walker (2 wheeled) Transfers: Sit to/from Stand Sit to Stand: Min guard         General transfer comment: Performed x 3 during session, had pt place L foot forward for strengthening on R LE with standing  Ambulation/Gait Ambulation/Gait assistance: Min guard Gait Distance (Feet): 80 Feet (80'x2) Assistive device: Rolling walker (2 wheeled) Gait Pattern/deviations: Step-through pattern Gait velocity: decreased   General Gait Details: Pt with slow gait pattern but was steady with RW.  Did well with navigating RW in tight areas, min cues for RW proximity with turns.  Fatigued easily.  80'x2 with seated rest break   Stairs             Wheelchair Mobility    Modified Rankin (Stroke Patients Only) Modified Rankin (Stroke Patients Only) Pre-Morbid Rankin Score:  Moderate disability Modified Rankin: Moderately severe disability     Balance Overall balance assessment: History of Falls;Needs assistance Sitting-balance support: Feet supported;No upper extremity supported Sitting balance-Leahy Scale: Good     Standing balance support: No upper extremity supported Standing balance-Leahy Scale: Good Standing balance comment: Could ambulate without AD but did demonstrate some instabilities but no overt LOB                            Cognition Arousal/Alertness: Awake/alert Behavior During Therapy: WFL for tasks assessed/performed Overall Cognitive Status: Impaired/Different from baseline                     Current Attention Level: Selective Memory: Decreased short-term memory Following Commands: Follows multi-step commands inconsistently;Follows one step commands consistently   Awareness: Anticipatory Problem Solving: Slow processing        Exercises General Exercises - Lower Extremity Ankle Circles/Pumps: AROM;Both;10 reps;Supine Long Arc Quad: AROM;Both;10 reps;Seated Heel Slides: AROM;Both;10 reps;Supine Hip ABduction/ADduction: AROM;Both;10 reps;Supine Hip Flexion/Marching: AROM;10 reps;Seated;Both Other Exercises Other Exercises: cues for full ROM on R    General Comments General comments (skin integrity, edema, etc.): On RA with sats 99%      Pertinent Vitals/Pain Pain Assessment: No/denies pain    Home Living                      Prior Function  PT Goals (current goals can now be found in the care plan section) Acute Rehab PT Goals Patient Stated Goal: to walk; go home PT Goal Formulation: With patient/family Time For Goal Achievement: 05/24/21 Potential to Achieve Goals: Good Progress towards PT goals: Progressing toward goals    Frequency    Min 4X/week      PT Plan Frequency needs to be updated    Co-evaluation              AM-PAC PT "6 Clicks" Mobility    Outcome Measure  Help needed turning from your back to your side while in a flat bed without using bedrails?: None Help needed moving from lying on your back to sitting on the side of a flat bed without using bedrails?: A Little Help needed moving to and from a bed to a chair (including a wheelchair)?: A Little Help needed standing up from a chair using your arms (e.g., wheelchair or bedside chair)?: A Little Help needed to walk in hospital room?: A Little Help needed climbing 3-5 steps with a railing? : A Little 6 Click Score: 19    End of Session Equipment Utilized During Treatment: Gait belt Activity Tolerance: Patient tolerated treatment well Patient left: with call bell/phone within reach;with chair alarm set;in chair Nurse Communication: Mobility status PT Visit Diagnosis: Unsteadiness on feet (R26.81);Difficulty in walking, not elsewhere classified (R26.2);Other abnormalities of gait and mobility (R26.89)     Time: 0600-4599 PT Time Calculation (min) (ACUTE ONLY): 20 min  Charges:  $Gait Training: 8-22 mins                     Abran Richard, PT Acute Rehab Services Pager [phone] [facility] [place] 05/18/2021, 3:15 PM

## 2021-05-18 NOTE — Plan of Care (Signed)

## 2021-05-18 NOTE — Progress Notes (Signed)
PROGRESS NOTE    Christine Gibson  ZOX:[id number] DOB: 09/06/1951 DOA: 05/09/2021 PCP: Jearld Fenton, NP   Brief Narrative:   Christine Gibson is a 70 y.o. 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  MRI brain revealed small acute left thalamocapsular infarct.  Faint edema without mass-effect.  Seen by neurology who recommended hospitalist admission for stroke work-up.  Work up complete Plan now is for patient to go home in the AM.  Assessment & Plan:   Active Problems:   CLL (chronic lymphocytic leukemia) (HCC)   HLD (hyperlipidemia)   HTN (hypertension)   DM type 2 (diabetes mellitus, type 2) (HCC)   Anal warts   Acute CVA (cerebrovascular accident) ([place])   COVID-19 virus infection   ESRD needing dialysis ([place])   Acute CVA, POA: MRI brain showed small acute left thalamocapsular infarct without mass-effect. MRA head and neck negative for CVO.  Echo shows 50% ejection fraction, grade 1 diastolic dysfunction but no ASD.  -no antiplatelet due to severe thrombocytopenia.  Continue home medications which includes Coreg, hydralazine, losartan, Lasix   ESRD on HD TTS: Nephrology following.   Prediabetes: Hemoglobin A1c 6.0.  Diet controlled prior to admission.  Currently slightly hyperglycemic due to being on steroids.  Continue SSI.   Chronic hypoxic respiratory failure secondary to acute combined systolic and diastolic CHF: Patient uses 2 L of oxygen at home.  Echo shows 50% ejection fraction and grade 1 diastolic dysfunction. Elevated BNP greater than 3000.  Chest x-ray also shows pulmonary edema.  Continue hemodialysis.  Continue Lasix   COVID-19 viral infection Self-reported positive COVID-19 screening test 03/15/2021 at outside facility Positive COVID  test on 05/09/2021.  Completed 3 doses of remdesivir on 05/11/2021.  Continue prednisone as noted below per ENT   Hyperlipidemia: Continue Lipitor.   CLL/chronic thrombocytopenia: Stable.  No signs of bleeding.  Per neurology, not a candidate for any antiplatelet.  Continue to monitor CBC.   Anemia of chronic disease: Likely related to ESRD. Hemoglobin at baseline.  Monitor.   Sudden right-sided hearing loss: Per husband and patient, she lost her hearing suddenly on the right side about a week ago.  Likely not secondary to stroke since stroke is only lacunar and on the left side.  Per Dr. Doristine Bosworth, discussed with ENT Dr. Hayden Rasmussen and requested to see this patient however was informed that there would be no change in the management even when the patient is seen in the hospital and thus recommended to start this patient on prednisone 60 mg p.o. daily for 10 days and for patient to call ENT office [phone] to make an appointment on discharge.   DVT prophylaxis:SCDs Code Status: Full Family Communication:called husband again Disposition Plan:  Status is: Inpatient  Remains inpatient appropriate because:Unsafe d/c plan  Dispo:  Patient From: Home  Planned Disposition: Home  Medically stable for discharge: yes      Consultants:  Neurology Nephrology    Antimicrobials:  Anti-infectives (From admission, onward)    Start     Dose/Rate Route Frequency Ordered Stop   05/11/21 1000  remdesivir 100 mg in sodium chloride 0.9 % 100 mL IVPB  Status:  Discontinued       See Hyperspace for full Linked Orders Report.   100 mg 200  mL/hr over 30 Minutes Intravenous Daily 05/09/21 2326 05/10/21 1611   05/11/21 1000  remdesivir 100 mg in sodium chloride 0.9 % 100 mL IVPB       See Hyperspace for full Linked Orders Report.   100 mg 200 mL/hr over 30 Minutes Intravenous Daily 05/10/21 1611 05/12/21 0937   05/10/21 0030  remdesivir 200 mg in sodium chloride 0.9% 250 mL IVPB       See Hyperspace for  full Linked Orders Report.   200 mg 580 mL/hr over 30 Minutes Intravenous Once 05/09/21 2326 05/10/21 0105       Subjective: Wants to go home-- just back from HD  Objective: Vitals:   05/18/21 1000 05/18/21 1030 05/18/21 1100 05/18/21 1243  BP: 138/70 (!) 128/58 (!) 156/71 (!) 148/76  Pulse: 87 82 81 80  Resp:   18 20  Temp:   98.2 F (36.8 C) 98.6 F (37 C)  TempSrc:   Axillary   SpO2:   100%   Weight:   42 kg   Height:       No intake or output data in the 24 hours ending 05/18/21 1647  Filed Weights   05/18/21 0440 05/18/21 0742 05/18/21 1100  Weight: 44 kg 43.5 kg 42 kg    Examination:   General: Appearance:    Thin female in no acute distress     Lungs:     respirations unlabored                 Data Reviewed: I have personally reviewed following labs and imaging studies  CBC: Recent Labs  Lab 05/12/21 1036 05/13/21 0349 05/13/21 1059 05/14/21 0601 05/15/21 0419 05/16/21 0142 05/18/21 0850  WBC 8.4 38.9* 14.1* 15.9* 14.2* 15.6* 21.9*  NEUTROABS 4.2 10.0* 4.6 5.0  --   --   --   HGB 9.4* 10.0* 9.1* 8.8* 8.1* 8.2* 8.3*  HCT 28.1* 30.2* 27.2* 27.0* 24.3* 24.6* 25.3*  MCV 82.6 82.7 81.7 82.1 81.8 82.0 83.5  PLT 39* 58* 39* 36* 38* 41* 44*   Basic Metabolic Panel: Recent Labs  Lab 05/12/21 1036 05/13/21 0349 05/15/21 0419 05/15/21 1805 05/15/21 2228 05/16/21 0142 05/17/21 2229 05/18/21 0850  NA 137 139 138  --   --  134*  --  136  K 3.3* 4.3 3.8  --   --  4.1  --  4.0  CL 98 101 99  --   --  96*  --  100  CO2 27 25 28   --   --  25  --  22  GLUCOSE 278* 54* 170* 650* 460* 315* 425* 118*  BUN 32* 61* 52*  --   --  80*  --  98*  CREATININE 3.21* 4.57* 4.10*  --   --  5.31*  --  5.93*  CALCIUM 8.2* 8.4* 8.1*  --   --  7.7*  --  7.5*  MG 1.9  --   --   --   --   --   --   --   PHOS 3.6  --   --   --   --   --   --  5.4*   GFR: Estimated Creatinine Clearance: 5.9 mL/min (A) (by C-G formula based on SCr of 5.93 mg/dL (H)). Liver Function  Tests: Recent Labs  Lab 05/12/21 1036 05/18/21 0850  ALBUMIN 2.8* 2.6*   No results for input(s): LIPASE, AMYLASE in the last 168 hours. No results for input(s): AMMONIA in the  last 168 hours. Coagulation Profile: No results for input(s): INR, PROTIME in the last 168 hours. Cardiac Enzymes: No results for input(s): CKTOTAL, CKMB, CKMBINDEX, TROPONINI in the last 168 hours. BNP (last 3 results) No results for input(s): PROBNP in the last 8760 hours. HbA1C: No results for input(s): HGBA1C in the last 72 hours. CBG: Recent Labs  Lab 05/17/21 2211 05/18/21 0125 05/18/21 0647 05/18/21 1242 05/18/21 1616  GLUCAP 443* 271* 114* 171* 251*   Lipid Profile: No results for input(s): CHOL, HDL, LDLCALC, TRIG, CHOLHDL, LDLDIRECT in the last 72 hours. Thyroid Function Tests: No results for input(s): TSH, T4TOTAL, FREET4, T3FREE, THYROIDAB in the last 72 hours. Anemia Panel: No results for input(s): VITAMINB12, FOLATE, FERRITIN, TIBC, IRON, RETICCTPCT in the last 72 hours. Sepsis Labs: No results for input(s): PROCALCITON, LATICACIDVEN in the last 168 hours.  Recent Results (from the past 240 hour(s))  Resp Panel by RT-PCR (Flu A&B, Covid) Nasopharyngeal Swab     Status: Abnormal   Collection Time: 05/09/21  4:10 PM   Specimen: Nasopharyngeal Swab; Nasopharyngeal(NP) swabs in vial transport medium  Result Value Ref Range Status   SARS Coronavirus 2 by RT PCR POSITIVE (A) NEGATIVE Final    Comment: RESULT CALLED TO, READ BACK BY AND VERIFIED WITH: A BANKS RN [id number] 05/09/21 A BROWNING (NOTE) SARS-CoV-2 target nucleic acids are DETECTED.  The SARS-CoV-2 RNA is generally detectable in upper respiratory specimens during the acute phase of infection. Positive results are indicative of the presence of the identified virus, but do not rule out bacterial infection or co-infection with other pathogens not detected by the test. Clinical correlation with patient history and other diagnostic  information is necessary to determine patient infection status. The expected result is Negative.  Fact Sheet for Patients: EntrepreneurPulse.com.au  Fact Sheet for Healthcare Providers: IncredibleEmployment.be  This test is not yet approved or cleared by the [place] FDA and  has been authorized for detection and/or diagnosis of SARS-CoV-2 by FDA under an Emergency Use Authorization (EUA).  This EUA will remain in effect (meaning this test can b e used) for the duration of  the COVID-19 declaration under Section 564(b)(1) of the Act, 21 U.S.C. section 360bbb-3(b)(1), unless the authorization is terminated or revoked sooner.     Influenza A by PCR NEGATIVE NEGATIVE Final   Influenza B by PCR NEGATIVE NEGATIVE Final    Comment: (NOTE) The Xpert Xpress SARS-CoV-2/FLU/RSV plus assay is intended as an aid in the diagnosis of influenza from Nasopharyngeal swab specimens and should not be used as a sole basis for treatment. Nasal washings and aspirates are unacceptable for Xpert Xpress SARS-CoV-2/FLU/RSV testing.  Fact Sheet for Patients: EntrepreneurPulse.com.au  Fact Sheet for Healthcare Providers: IncredibleEmployment.be  This test is not yet approved or cleared by the [place] FDA and has been authorized for detection and/or diagnosis of SARS-CoV-2 by FDA under an Emergency Use Authorization (EUA). This EUA will remain in effect (meaning this test can be used) for the duration of the COVID-19 declaration under Section 564(b)(1) of the Act, 21 U.S.C. section 360bbb-3(b)(1), unless the authorization is terminated or revoked.  Performed at [facility], [place] [address], [place], [place] [address]   C Difficile Quick Screen w PCR reflex     Status: None   Collection Time: 05/11/21  3:20 PM   Specimen: STOOL  Result Value Ref Range Status   C Diff antigen NEGATIVE NEGATIVE Final   C Diff  toxin NEGATIVE NEGATIVE Final   C  Diff interpretation No C. difficile detected.  Final    Comment: Performed at [facility], [place] [address], [place], [place] [address]         Radiology Studies: No results found.      Scheduled Meds:  (feeding supplement) PROSource Plus  30 mL Oral BID BM   vitamin C  500 mg Oral Daily   atorvastatin  40 mg Oral Daily   carvedilol  25 mg Oral BID WC   [START ON 05/23/2021] darbepoetin (ARANESP) injection - DIALYSIS  150 mcg Intravenous Q Tue-HD   doxercalciferol  2 mcg Intravenous Q T,Th,Sa-HD   furosemide  80 mg Oral Daily   hydrALAZINE  50 mg Oral TID   insulin aspart  0-5 Units Subcutaneous QHS   insulin aspart  0-9 Units Subcutaneous TID WC   insulin aspart  7 Units Subcutaneous TID WC   losartan  100 mg Oral Daily   melatonin  3 mg Oral QHS   multivitamin with minerals  1 tablet Oral Daily   NIFEdipine  90 mg Oral Daily   sertraline  100 mg Oral Daily   zinc sulfate  220 mg Oral Daily     LOS: 9 days    Time spent: 25 minutes    Geradine Girt, DO [facility]  If 7PM-7AM, please contact night-coverage [email or web address] 05/18/2021, 4:47 PM

## 2021-05-18 NOTE — Plan of Care (Signed)
Patient is alert oriented x 4. Pt is able to move around in bed. +1 assist to bedside commode. Pt blood glucose was greater than 400, STAT lab drawn- 425 glucose. MD paged to inform. Received 6 units of novolog. Pt is anuric. Pt c/o pain to rectum area prn pain medication given with effective results. Pt has cough, prn cough medicine given with effective results.    Problem: Education: Goal: Knowledge of General Education information will improve Description: Including pain rating scale, medication(s)/side effects and non-pharmacologic comfort measures Outcome: Progressing   Problem: Health Behavior/Discharge Planning: Goal: Ability to manage health-related needs will improve Outcome: Progressing   Problem: Clinical Measurements: Goal: Ability to maintain clinical measurements within normal limits will improve Outcome: Progressing Goal: Will remain free from infection Outcome: Progressing Goal: Diagnostic test results will improve Outcome: Progressing Goal: Respiratory complications will improve Outcome: Progressing Goal: Cardiovascular complication will be avoided Outcome: Progressing   Problem: Activity: Goal: Risk for activity intolerance will decrease Outcome: Progressing   Problem: Nutrition: Goal: Adequate nutrition will be maintained Outcome: Progressing   Problem: Coping: Goal: Level of anxiety will decrease Outcome: Progressing   Problem: Elimination: Goal: Will not experience complications related to bowel motility Outcome: Progressing Goal: Will not experience complications related to urinary retention Outcome: Progressing   Problem: Pain Managment: Goal: General experience of comfort will improve Outcome: Progressing   Problem: Safety: Goal: Ability to remain free from injury will improve Outcome: Progressing   Problem: Skin Integrity: Goal: Risk for impaired skin integrity will decrease Outcome: Progressing

## 2021-05-18 NOTE — TOC Progression Note (Signed)
Transition of Care [facility]) - Progression Note    Patient Details  Name: SHANEN NORRIS MRN: [id number] Date of Birth: [birth year]  Transition of Care [facility]) CM/SW [place], [place] Phone Number: 05/18/2021, 2:35 PM  Clinical Narrative:   CSW spoke with patient's spouse who did come and visit the patient last night, feels that he can take her home now that she is walking. Spouse would like to resume hospice services with [facility], and indicated that dialysis transport was asking for a wheelchair for the patient for transportation as she has been unsteady. CSW spoke with [facility], and they will order the wheelchair and deliver to the home. CSW updated MD, who will discharge home when medically ready, not ready today. CSW to continue to follow.    Expected Discharge Plan: Home w Hospice Care Barriers to Discharge: Continued Medical Work up  Expected Discharge Plan and Services Expected Discharge Plan: [place] In-house Referral: Clinical Social Work Discharge Planning Services: CM Consult Post Acute Care Choice: [facility] Living arrangements for the past 2 months: Single Family Home                                       Social Determinants of Health (SDOH) Interventions    Readmission Risk Interventions No flowsheet data found.

## 2021-05-18 NOTE — Consult Note (Signed)
   [facility] Inpatient Consult   05/18/2021  CASY TAVANO 07-19-1951 [id number]  [place] Organization [ACO] Patient: [facility] Medicare   Patient screened for length of stay  hospitalization with noted high risk score for unplanned readmission risk and  to assess for potential [place] Management service needs for post hospital transition.  Review of patient's medical record for post hospital recommendations from inpatient [facility] notes and PT/OT recommendations as well.  Plan:  Continue to follow progress and disposition to assess for post hospital care management needs. Current disposition is not finalized as this Probation officer can assess.    For questions contact:   Natividad Brood, RN BSN [facility] Liaison  [phone] business mobile phone Toll free office [phone]  Fax number: [phone] [email or web address]

## 2021-05-19 DIAGNOSIS — I639 Cerebral infarction, unspecified: Secondary | ICD-10-CM | POA: Diagnosis not present

## 2021-05-19 DIAGNOSIS — E119 Type 2 diabetes mellitus without complications: Secondary | ICD-10-CM | POA: Diagnosis not present

## 2021-05-19 DIAGNOSIS — C9110 Chronic lymphocytic leukemia of B-cell type not having achieved remission: Secondary | ICD-10-CM

## 2021-05-19 DIAGNOSIS — U071 COVID-19: Secondary | ICD-10-CM

## 2021-05-19 LAB — GLUCOSE, CAPILLARY
Glucose-Capillary: 124 mg/dL — ABNORMAL HIGH (ref 70–99)
Glucose-Capillary: 96 mg/dL (ref 70–99)

## 2021-05-19 NOTE — Unmapped (Signed)
Specialty Medication(s): zanubrutinib    Ms.Sprunger has been dis-enrolled from the [facility] specialty pharmacy services due to medication discontinuation resulting from progression of disease.    Additional information provided to the patient: NA    Ashantee Deupree A Shari [facility] Specialty Pharmacist

## 2021-05-19 NOTE — TOC Transition Note (Signed)
Transition of Care [facility]) - CM/SW Discharge Note   Patient Details  Name: Christine Gibson MRN: [id number] Date of Birth: 1951-10-09  Transition [facility]) CM/SW Contact:  Geralynn Ochs, LCSW Phone Number: 05/19/2021, 10:22 AM   Clinical Narrative:   CSW spoke with patient's spouse about discharge home today. Spouse is available to provide transport home whenever patient is ready for discharge. CSW notified [facility] liaison that patient will discharge home today. No other TOC needs at this time.     Final next level of care: Home w Hospice Care Barriers to Discharge: Barriers Resolved   Patient Goals and CMS Choice Patient states their goals for this hospitalization and ongoing recovery are:: get home CMS Medicare.gov Compare Post Acute Care list provided to:: Patient Represenative (must comment) Choice offered to / list presented to : Patient, Spouse  Discharge Placement                Patient to be transferred to facility by: Family car Name of family member notified: [name] Patient and family notified of of transfer: 05/19/21  Discharge Plan and Services In-house Referral: Clinical Social Work Discharge Planning Services: [employer] Consult Post Acute Care Choice: [place]                               Social Determinants of Health (SDOH) Interventions     Readmission Risk Interventions No flowsheet data found.

## 2021-05-19 NOTE — Discharge Summary (Signed)
Physician Discharge Summary  Christine Gibson XVQ:[id number] DOB: Nov 10, 1950 DOA: 05/09/2021  PCP: Jearld Fenton, NP  Admit date: 05/09/2021 Discharge date: 05/19/2021  Admitted From: home Discharge disposition: home   Recommendations for Outpatient Follow-Up:   Resume hospice (on for CLL)-- need continued goals of care Resume HD patient to call ENT office [phone] to make an appointment on discharge.   Discharge Diagnosis:   Active Problems:   CLL (chronic lymphocytic leukemia) (HCC)   HLD (hyperlipidemia)   HTN (hypertension)   DM type 2 (diabetes mellitus, type 2) (HCC)   Anal warts   Acute CVA (cerebrovascular accident) ([place])   COVID-19 virus infection   ESRD needing dialysis [facility])    Discharge Condition: Improved.  Diet recommendation: renal/Carbohydrate-modified.  Wound care: None.  Code status: Full.   History of Present Illness:   Christine Gibson is a 70 y.o. 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  History is obtained from the patient and her husband via phone, poor historians.  CT head was abnormal, revealing new 1.6 cm amorphous area of hyperdensity within the right cerebellar hemisphere.  MRI brain revealed small acute left thalamocapsular infarct.  Faint edema without mass-effect.  Seen by neurology who recommended hospitalist admission for stroke work-up.   Hospital Course by Problem:   Acute CVA, POA: MRI brain showed small acute left thalamocapsular infarct without mass-effect. MRA head and neck negative for CVO.  Echo shows 50% ejection fraction, grade 1 diastolic dysfunction but no ASD. -no antiplatelet due to severe thrombocytopenia.  Continue home medications which includes Coreg, hydralazine, losartan, Lasix   ESRD on HD TTS: Nephrology following.   Prediabetes:  Hemoglobin A1c 6.0.  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  Echo shows 50% ejection fraction and grade 1 diastolic dysfunction. Elevated BNP greater than 3000.  Chest x-ray also shows pulmonary edema.  Continue hemodialysis.  Continue Lasix   COVID-19 viral infection Self-reported positive COVID-19 screening test 03/15/2021 at outside facility Positive COVID test on 05/09/2021.  Completed 3 doses of remdesivir on 05/11/2021.  Continue prednisone as noted below per ENT   Hyperlipidemia: Continue Lipitor.   CLL/chronic thrombocytopenia: Stable.  No signs of bleeding.  Per neurology, not a candidate for any antiplatelet.     Anemia of chronic disease: Likely related to ESRD.    Sudden right-sided hearing loss: Per husband and patient, she lost her hearing suddenly on the right side about a week ago.  Likely not secondary to stroke since stroke is only lacunar and on the left side.  Per Dr. Doristine Bosworth, discussed with ENT Dr. Hayden Rasmussen and requested to see this patient however was informed that there would be no change in the management even when the patient is seen in the hospital and thus recommended to start this patient on prednisone 60 mg p.o. daily for 10 days and for patient to call ENT office [phone] to make an appointment on discharge.  Patient finished course    Medical Consultants:      Discharge Exam:   Vitals:   05/19/21 0356 05/19/21 0742  BP: 138/60 140/65  Pulse: 74 83  Resp: 18 19  Temp: 98.8 F (37.1 C) 98.3 F (36.8 C)  SpO2: 100% 100%   Vitals:   05/18/21 2226 05/18/21 2351 05/19/21 0356 05/19/21 0742  BP: (!) 120/52 (!) 121/47 138/60 140/65  Pulse: 81 76 74 83  Resp:  18 18 19   Temp:  98.2 F (36.8 C) 98.8 F (37.1 C) 98.3 F (36.8 C)  TempSrc:  Oral Oral Oral  SpO2:  100% 100% 100%  Weight:   43 kg   Height:        General exam:  Appears calm and comfortable.  The results of significant diagnostics from this hospitalization (including imaging, microbiology, ancillary and laboratory) are listed below for reference.     Procedures and Diagnostic Studies:   CT Head Wo Contrast  Result Date: 05/09/2021 CLINICAL DATA:  Neuro deficit, acute stroke suspected. Altered mental status. EXAM: CT HEAD WITHOUT CONTRAST TECHNIQUE: Contiguous axial images were obtained from the base of the skull through the vertex without intravenous contrast. COMPARISON:  CT head January 12, 2019. FINDINGS: Brain: New amorphous 1.6 x 1.6 x 1.1 cm area of hyperdensity within the right cerebellar hemisphere. The surrounding hypodensity appears to be encephalomalacia rather than edema. Also, there is no significant mass effect. No evidence acute large vascular territory infarct. No midline shift. Basal cisterns are patent. Generalized atrophy with ex vacuo ventricular dilation, progressed from the prior. No hydrocephalus. No extra-axial fluid collection. Small remote left cerebellar lacunar infarct with suspected additional small area of dystrophic calcification in the inferior left cerebellum. Mild patchy white matter hypoattenuation, likely the sequela of chronic microvascular ischemic disease. Vascular: No hyperdense vessel identified. Calcific intracranial atherosclerosis. Skull: No acute fracture. Sinuses/Orbits: Severe pansinus disease with complete opacification of bilateral frontal sinuses, near complete opacification of the ethmoid air cells and left sphenoid sinus and moderate mucosal thickening of the remaining sinuses. Other: Trace bilateral mastoid fluid with right greater than left middle ear fluid. IMPRESSION: 1. New 1.6 cm amorphous area of hyperdensity within the right cerebellar hemisphere, which has an appearance suggestive of an area of dystrophic calcification from prior insult. The appearance is atypical for acute hemorrhage, particularly given  suggestion of surrounding encephalomalacia and no significant mass effect. An MRI with contrast is recommended to further characterize and to exclude a mass. 2. Small remote left cerebellar lacunar infarct with suspected additional small area of calcification in the inferior left cerebellum. 3. No evidence of acute large vascular territory infarct. 4. Moderate atrophy and mild chronic microvascular ischemic disease. 5. Severe pansinusitis. 6. Trace bilateral mastoid fluid with right greater than left middle ear fluid. Findings and recommendations discussed with Dr. Regenia Skeeter via telephone at 1:30 p.m. Electronically Signed   By: Margaretha Sheffield MD   On: 05/09/2021 14:00   MR ANGIO HEAD WO CONTRAST  Result Date: 05/10/2021 CLINICAL DATA:  Stroke follow-up EXAM: MRA NECK WITHOUT CONTRAST MRA HEAD WITHOUT CONTRAST TECHNIQUE: Multiplanar and multiecho pulse sequences of the neck were obtained without intravenous contrast. Angiographic images of the neck were obtained using MRA technique without intravenous contrast; Angiographic images of the Circle of Willis were obtained using MRA technique without intravenous contrast. COMPARISON:  None. FINDINGS: MRA NECK FINDINGS Normal carotid and vertebral arteries. MRA HEAD FINDINGS POSTERIOR CIRCULATION: --Vertebral arteries: Normal --Inferior cerebellar arteries: Normal. --Basilar artery: Normal. --Superior cerebellar arteries: Normal. --Posterior cerebral arteries: Narrowing of the right PCA distal P2 segment. Otherwise normal. ANTERIOR CIRCULATION: --Intracranial internal carotid arteries: Normal. --Anterior cerebral arteries (ACA): Normal. Absent left A1 segment, normal variant --Middle cerebral arteries (MCA): Normal. ANATOMIC VARIANTS: None IMPRESSION: Normal MRA of  the head and neck. Electronically Signed   By: Ulyses Jarred M.D.   On: 05/10/2021 03:06   MR ANGIO NECK WO CONTRAST  Result Date: 05/10/2021 CLINICAL DATA:  Stroke follow-up EXAM: MRA NECK WITHOUT  CONTRAST MRA HEAD WITHOUT CONTRAST TECHNIQUE: Multiplanar and multiecho pulse sequences of the neck were obtained without intravenous contrast. Angiographic images of the neck were obtained using MRA technique without intravenous contrast; Angiographic images of the Circle of Willis were obtained using MRA technique without intravenous contrast. COMPARISON:  None. FINDINGS: MRA NECK FINDINGS Normal carotid and vertebral arteries. MRA HEAD FINDINGS POSTERIOR CIRCULATION: --Vertebral arteries: Normal --Inferior cerebellar arteries: Normal. --Basilar artery: Normal. --Superior cerebellar arteries: Normal. --Posterior cerebral arteries: Narrowing of the right PCA distal P2 segment. Otherwise normal. ANTERIOR CIRCULATION: --Intracranial internal carotid arteries: Normal. --Anterior cerebral arteries (ACA): Normal. Absent left A1 segment, normal variant --Middle cerebral arteries (MCA): Normal. ANATOMIC VARIANTS: None IMPRESSION: Normal MRA of the head and neck. Electronically Signed   By: Ulyses Jarred M.D.   On: 05/10/2021 03:06   MR BRAIN WO CONTRAST  Result Date: 05/09/2021 EXAM: MRI HEAD WITHOUT CONTRAST TECHNIQUE: Multiplanar, multiecho pulse sequences of the brain and surrounding structures were obtained without intravenous contrast. COMPARISON:  Same day CT head. FINDINGS: Brain: Small acute left thalamocapsular infarct. Encephalomalacia in the right cerebellum with associated susceptibility artifact. Smaller remote infarcts in the left cerebellum with some areas of susceptibility artifact. Moderate scattered T2/FLAIR hyperintensities in the supratentorial and infratentorial white matter, nonspecific but most likely related to chronic microvascular ischemic disease. Moderate atrophy with ex vacuo ventricular dilation. No hydrocephalus. No evidence of acute hemorrhage. No extra-axial fluid collections. No evidence of a mass lesion. Vascular: Major arterial flow voids are maintained at the skull base. Skull and  upper cervical spine: Normal marrow signal. Sinuses/Orbits: Severe pansinusitis. Other: Small bilateral mastoid effusions with bilateral middle ear fluid. IMPRESSION: 1. Small acute left thalamocapsular infarct. Faint edema without mass effect. 2. Encephalomalacia in the right cerebellum with associated susceptibility artifact, likely the sequela of prior infarct with dystrophic calcification and/or prior hemorrhage. Smaller remote infarcts in the left cerebellum. 3. Moderate chronic microvascular ischemic disease 4. Severe pansinusitis. 5. Small bilateral mastoid effusions with bilateral middle ear fluid. Electronically Signed   By: Margaretha Sheffield MD   On: 05/09/2021 17:59   DG Chest Portable 1 View  Result Date: 05/09/2021 CLINICAL DATA:  Shortness of breath, generalized weakness, unable to talk in complete sentences, end-stage renal disease on dialysis, missed dialysis today due to weakness EXAM: PORTABLE CHEST 1 VIEW COMPARISON:  Portable exam 1123 hours compared to 01/12/2019 FINDINGS: Enlargement of cardiac silhouette with pulmonary vascular congestion. Atherosclerotic calcification aorta. Mild interstitial infiltrates consistent with pulmonary edema and CHF versus fluid overload. LEFT pleural effusion and basilar atelectasis present. No pneumothorax or acute osseous findings. IMPRESSION: Enlargement of cardiac silhouette with pulmonary vascular congestion and mild diffuse pulmonary edema. LEFT pleural effusion and LEFT basilar atelectasis. Aortic Atherosclerosis (ICD10-I70.0). Electronically Signed   By: Lavonia Dana M.D.   On: 05/09/2021 11:53   ECHOCARDIOGRAM COMPLETE BUBBLE STUDY  Result Date: 05/10/2021    ECHOCARDIOGRAM REPORT   Patient Name:   Stephens Shire Date of Exam: 05/10/2021 Medical Rec #:  [id number]       Height:       60.0 in Accession #:    [id number]      Weight:       110.0 lb Date of Birth:  1951-08-05  BSA:          1.448 m Patient Age:    16 years        BP:            152/84 mmHg Patient Gender: F               HR:           75 bpm. Exam Location:  Inpatient Procedure: 2D Echo Indications:    Stroke I63.9  History:        Patient has prior history of Echocardiogram examinations, most                 recent 12/12/2012. Risk Factors:Hypertension, Diabetes and Covid                 Positive.  Sonographer:    Mikki Santee RDCS (AE) Referring Phys: [id number] [place]  1. Left ventricular ejection fraction, by estimation, is 50%. The left ventricle has mildly decreased function. The left ventricle demonstrates global hypokinesis. There is mild left ventricular hypertrophy. Left ventricular diastolic parameters are consistent with Grade I diastolic dysfunction (impaired relaxation).  2. Right ventricular systolic function is normal. The right ventricular size is normal. There is normal pulmonary artery systolic pressure. The estimated right ventricular systolic pressure is 78.6 mmHg.  3. Left atrial size was mildly dilated.  4. The mitral valve is normal in structure. Mild mitral valve regurgitation. No evidence of mitral stenosis.  5. The aortic valve is tricuspid. Aortic valve regurgitation is not visualized. Mild aortic valve sclerosis is present, with no evidence of aortic valve stenosis.  6. The inferior vena cava is normal in size with greater than 50% respiratory variability, suggesting right atrial pressure of 3 mmHg.  7. No PFO or ASD by bubble study. FINDINGS  Left Ventricle: Left ventricular ejection fraction, by estimation, is 50%. The left ventricle has mildly decreased function. The left ventricle demonstrates global hypokinesis. The left ventricular internal cavity size was normal in size. There is mild left ventricular hypertrophy. Left ventricular diastolic parameters are consistent with Grade I diastolic dysfunction (impaired relaxation). Right Ventricle: The right ventricular size is normal. No increase in right ventricular wall thickness. Right  ventricular systolic function is normal. There is normal pulmonary artery systolic pressure. The tricuspid regurgitant velocity is 2.67 m/s, and  with an assumed right atrial pressure of 3 mmHg, the estimated right ventricular systolic pressure is 76.7 mmHg. Left Atrium: Left atrial size was mildly dilated. Right Atrium: Right atrial size was normal in size. Pericardium: There is no evidence of pericardial effusion. Mitral Valve: The mitral valve is normal in structure. There is mild calcification of the mitral valve leaflet(s). Mild mitral annular calcification. Mild mitral valve regurgitation. No evidence of mitral valve stenosis. Tricuspid Valve: The tricuspid valve is normal in structure. Tricuspid valve regurgitation is mild. Aortic Valve: The aortic valve is tricuspid. Aortic valve regurgitation is not visualized. Mild aortic valve sclerosis is present, with no evidence of aortic valve stenosis. Pulmonic Valve: The pulmonic valve was normal in structure. Pulmonic valve regurgitation is not visualized. Aorta: The aortic root is normal in size and structure. Venous: The inferior vena cava is normal in size with greater than 50% respiratory variability, suggesting right atrial pressure of 3 mmHg. IAS/Shunts: No PFO or ASD by bubble study.  LEFT VENTRICLE PLAX 2D LVIDd:         4.10 cm      Diastology LVIDs:  3.20 cm      LV e' medial:    3.59 cm/s LV PW:         1.00 cm      LV E/e' medial:  27.3 LV IVS:        1.10 cm      LV e' lateral:   4.79 cm/s LVOT diam:     2.00 cm      LV E/e' lateral: 20.4 LV SV:         66 LV SV Index:   46 LVOT Area:     3.14 cm  LV Volumes (MOD) LV vol d, MOD A4C: 101.0 ml LV vol s, MOD A4C: 47.5 ml LV SV MOD A4C:     101.0 ml RIGHT VENTRICLE RV Basal diam:  2.60 cm RV S prime:     8.16 cm/s TAPSE (M-mode): 1.4 cm LEFT ATRIUM             Index       RIGHT ATRIUM           Index LA diam:        3.30 cm 2.28 cm/m  RA Area:     13.40 cm LA Vol (A2C):   55.9 ml 38.61 ml/m RA  Volume:   32.10 ml  22.17 ml/m LA Vol (A4C):   50.6 ml 34.95 ml/m LA Biplane Vol: 56.4 ml 38.95 ml/m  AORTIC VALVE LVOT Vmax:   100.00 cm/s LVOT Vmean:  58.700 cm/s LVOT VTI:    0.210 m  AORTA Ao Root diam: 2.30 cm MITRAL VALVE                TRICUSPID VALVE MV Area (PHT): 4.39 cm     TR Peak grad:   28.5 mmHg MV Decel Time: 173 msec     TR Vmax:        267.00 cm/s MV E velocity: 97.90 cm/s MV A velocity: 142.00 cm/s  SHUNTS MV E/A ratio:  0.69         Systemic VTI:  0.21 m                             Systemic Diam: 2.00 cm Loralie Champagne MD Electronically signed by Loralie Champagne MD Signature Date/Time: 05/10/2021/4:40:38 PM    Final      Labs:   Basic Metabolic Panel: Recent Labs  Lab 05/12/21 1036 05/13/21 0349 05/15/21 0419 05/15/21 1805 05/15/21 2228 05/16/21 0142 05/17/21 2229 05/18/21 0850  NA 137 139 138  --   --  134*  --  136  K 3.3* 4.3 3.8  --   --  4.1  --  4.0  CL 98 101 99  --   --  96*  --  100  CO2 27 25 28   --   --  25  --  22  GLUCOSE 278* 54* 170* 650* 460* 315* 425* 118*  BUN 32* 61* 52*  --   --  80*  --  98*  CREATININE 3.21* 4.57* 4.10*  --   --  5.31*  --  5.93*  CALCIUM 8.2* 8.4* 8.1*  --   --  7.7*  --  7.5*  MG 1.9  --   --   --   --   --   --   --   PHOS 3.6  --   --   --   --   --   --  5.4*   GFR Estimated Creatinine Clearance: 6 mL/min (A) (by C-G formula based on SCr of 5.93 mg/dL (H)). Liver Function Tests: Recent Labs  Lab 05/12/21 1036 05/18/21 0850  ALBUMIN 2.8* 2.6*   No results for input(s): LIPASE, AMYLASE in the last 168 hours. No results for input(s): AMMONIA in the last 168 hours. Coagulation profile No results for input(s): INR, PROTIME in the last 168 hours.  CBC: Recent Labs  Lab 05/12/21 1036 05/13/21 0349 05/13/21 1059 05/14/21 0601 05/15/21 0419 05/16/21 0142 05/18/21 0850  WBC 8.4 38.9* 14.1* 15.9* 14.2* 15.6* 21.9*  NEUTROABS 4.2 10.0* 4.6 5.0  --   --   --   HGB 9.4* 10.0* 9.1* 8.8* 8.1* 8.2* 8.3*  HCT 28.1*  30.2* 27.2* 27.0* 24.3* 24.6* 25.3*  MCV 82.6 82.7 81.7 82.1 81.8 82.0 83.5  PLT 39* 58* 39* 36* 38* 41* 44*   Cardiac Enzymes: No results for input(s): CKTOTAL, CKMB, CKMBINDEX, TROPONINI in the last 168 hours. BNP: Invalid input(s): POCBNP CBG: Recent Labs  Lab 05/18/21 0647 05/18/21 1242 05/18/21 1616 05/18/21 2155 05/19/21 0633  GLUCAP 114* 171* 251* 143* 124*   D-Dimer No results for input(s): DDIMER in the last 72 hours. Hgb A1c No results for input(s): HGBA1C in the last 72 hours. Lipid Profile No results for input(s): CHOL, HDL, LDLCALC, TRIG, CHOLHDL, LDLDIRECT in the last 72 hours. Thyroid function studies No results for input(s): TSH, T4TOTAL, T3FREE, THYROIDAB in the last 72 hours.  Invalid input(s): FREET3 Anemia work up No results for input(s): VITAMINB12, FOLATE, FERRITIN, TIBC, IRON, RETICCTPCT in the last 72 hours. Microbiology Recent Results (from the past 240 hour(s))  Resp Panel by RT-PCR (Flu A&B, Covid) Nasopharyngeal Swab     Status: Abnormal   Collection Time: 05/09/21  4:10 PM   Specimen: Nasopharyngeal Swab; Nasopharyngeal(NP) swabs in vial transport medium  Result Value Ref Range Status   SARS Coronavirus 2 by RT PCR POSITIVE (A) NEGATIVE Final    Comment: RESULT CALLED TO, READ BACK BY AND VERIFIED WITH: A BANKS RN 0960 05/09/21 A BROWNING (NOTE) SARS-CoV-2 target nucleic acids are DETECTED.  The SARS-CoV-2 RNA is generally detectable in upper respiratory specimens during the acute phase of infection. Positive results are indicative of the presence of the identified virus, but do not rule out bacterial infection or co-infection with other pathogens not detected by the test. Clinical correlation with patient history and other diagnostic information is necessary to determine patient infection status. The expected result is Negative.  Fact Sheet for Patients: EntrepreneurPulse.com.au  Fact Sheet for Healthcare  Providers: IncredibleEmployment.be  This test is not yet approved or cleared by the [place] FDA and  has been authorized for detection and/or diagnosis of SARS-CoV-2 by FDA under an Emergency Use Authorization (EUA).  This EUA will remain in effect (meaning this test can b e used) for the duration of  the COVID-19 declaration under Section 564(b)(1) of the Act, 21 U.S.C. section 360bbb-3(b)(1), unless the authorization is terminated or revoked sooner.     Influenza A by PCR NEGATIVE NEGATIVE Final   Influenza B by PCR NEGATIVE NEGATIVE Final    Comment: (NOTE) The Xpert Xpress SARS-CoV-2/FLU/RSV plus assay is intended as an aid in the diagnosis of influenza from Nasopharyngeal swab specimens and should not be used as a sole basis for treatment. Nasal washings and aspirates are unacceptable for Xpert Xpress SARS-CoV-2/FLU/RSV testing.  Fact Sheet for Patients: EntrepreneurPulse.com.au  Fact Sheet for Healthcare Providers: IncredibleEmployment.be  This test is  not yet approved or cleared by the [place] and has been authorized for detection and/or diagnosis of SARS-CoV-2 by FDA under an Emergency Use Authorization (EUA). This EUA will remain in effect (meaning this test can be used) for the duration of the COVID-19 declaration under Section 564(b)(1) of the Act, 21 U.S.C. section 360bbb-3(b)(1), unless the authorization is terminated or revoked.  Performed at [facility], [place] [address], [place], [place] [address]   C Difficile Quick Screen w PCR reflex     Status: None   Collection Time: 05/11/21  3:20 PM   Specimen: STOOL  Result Value Ref Range Status   C Diff antigen NEGATIVE NEGATIVE Final   C Diff toxin NEGATIVE NEGATIVE Final   C Diff interpretation No C. difficile detected.  Final    Comment: Performed at [facility], [place] [address] [place], [place] [address]     Discharge  Instructions:   Discharge Instructions     Ambulatory referral to Neurology   Complete by: As directed    An appointment is requested in approximately: 4-8 weeks   Discharge instructions   Complete by: As directed    Resume HD Call ENT office [phone] to make an appointment for hearing loss Renal/carb mod Resume home hospice   Increase activity slowly   Complete by: As directed    No wound care   Complete by: As directed       Allergies as of 05/19/2021       Reactions   Lisinopril Cough        Medication List     TAKE these medications    acetaminophen 325 MG tablet Commonly known as: TYLENOL Take 650 mg by mouth every 4 (four) hours as needed for mild pain or fever.   albuterol 108 (90 Base) MCG/ACT inhaler Commonly known as: VENTOLIN HFA Inhale 1 puff into the lungs every 6 (six) hours as needed for wheezing or shortness of breath.   atorvastatin 40 MG tablet Commonly known as: LIPITOR Take 40 mg by mouth daily.   benzonatate 100 MG capsule Commonly known as: TESSALON Take 100 mg by mouth 3 (three) times daily as needed for cough.   Brukinsa 80 MG Caps Generic drug: Zanubrutinib Take 2 capsules by mouth 3 (three) times a week. Tues,Thur. Sat   carvedilol 25 MG tablet Commonly known as: COREG Take 25 mg by mouth 2 (two) times daily with a meal.   furosemide 80 MG tablet Commonly known as: LASIX Take 80 mg by mouth daily.   guaiFENesin 100 MG/5ML Soln Commonly known as: ROBITUSSIN Take 15 mLs by mouth every 4 (four) hours as needed for cough or to loosen phlegm.   hydrALAZINE 25 MG tablet Commonly known as: APRESOLINE Take 25 mg by mouth 3 (three) times daily.   hydrocortisone cream 1 % Apply 1 application topically 2 (two) times daily.   ipratropium-albuterol 0.5-2.5 (3) MG/3ML Soln Commonly known as: DUONEB Take 3 mLs by nebulization in the morning, at noon, and at bedtime.   lidocaine 5 % ointment Commonly known as: XYLOCAINE Apply 1  application topically 2 (two) times daily as needed (rectal pain).   losartan 100 MG tablet Commonly known as: COZAAR Take 100 mg by mouth daily.   meclizine 25 MG tablet Commonly known as: ANTIVERT Take 1 tablet (25 mg total) by mouth 3 (three) times daily as needed for dizziness.   melatonin 3 MG Tabs tablet Take 3 mg by mouth at  bedtime.   NIFEdipine 90 MG 24 hr tablet Commonly known as: ADALAT CC Take 90 mg by mouth daily.   oxyCODONE 5 MG immediate release tablet Commonly known as: Oxy IR/ROXICODONE Take 5 mg by mouth every 4 (four) hours as needed for pain.   sertraline 100 MG tablet Commonly known as: ZOLOFT Take 100 mg by mouth daily.          Time coordinating discharge: 35 min  Signed:  Geradine Girt DO  [facility] 05/19/2021, 8:35 AM

## 2021-05-20 ENCOUNTER — Telehealth (HOSPITAL_COMMUNITY): Payer: Self-pay | Admitting: Nephrology

## 2021-05-20 NOTE — Telephone Encounter (Signed)
Transition of care contact from inpatient facility  Date of Discharge: 05/19/21 Date of Contact:05/20/21 Method of contact: Phone  Attempted to contact patient to discuss transition of care from inpatient admission. Patient did not answer the phone. Message was left on the patient's voicemail with call back number [phone].  Veneta Penton, PA-C [employer] Pager [phone]

## 2021-05-23 DIAGNOSIS — C9112 Chronic lymphocytic leukemia of B-cell type in relapse: Principal | ICD-10-CM

## 2021-05-23 MED ORDER — MIRCERA INJ
0 days
Start: 2021-05-23 — End: 2022-05-22

## 2021-05-24 DIAGNOSIS — H900 Conductive hearing loss, bilateral: Secondary | ICD-10-CM | POA: Diagnosis not present

## 2021-05-25 MED ORDER — MORPHINE CONCENTRATE 20 MG/ML ORAL SOLUTION
0 days
Start: 2021-05-25 — End: ?

## 2021-05-25 MED ORDER — HYOSCYAMINE 0.125 MG SUBLINGUAL TABLET
0 days
Start: 2021-05-25 — End: ?

## 2021-05-25 MED ORDER — LORAZEPAM 0.5 MG TABLET
0 days
Start: 2021-05-25 — End: ?

## 2021-05-29 ENCOUNTER — Ambulatory Visit: Admit: 2021-05-29 | Discharge: 2021-05-30 | Payer: MEDICARE

## 2021-05-29 DIAGNOSIS — K629 Disease of anus and rectum, unspecified: Secondary | ICD-10-CM | POA: Diagnosis not present

## 2021-05-31 ENCOUNTER — Other Ambulatory Visit: Payer: Self-pay

## 2021-05-31 ENCOUNTER — Ambulatory Visit (INDEPENDENT_AMBULATORY_CARE_PROVIDER_SITE_OTHER): Payer: Medicare Other | Admitting: Nurse Practitioner

## 2021-05-31 ENCOUNTER — Encounter: Payer: Self-pay | Admitting: Nurse Practitioner

## 2021-05-31 ENCOUNTER — Other Ambulatory Visit: Payer: Self-pay | Admitting: Nurse Practitioner

## 2021-05-31 VITALS — BP 154/60 | HR 76 | Temp 96.8°F | Resp 18 | Ht 59.0 in | Wt 98.0 lb

## 2021-05-31 DIAGNOSIS — Z09 Encounter for follow-up examination after completed treatment for conditions other than malignant neoplasm: Secondary | ICD-10-CM | POA: Diagnosis not present

## 2021-05-31 DIAGNOSIS — I120 Hypertensive chronic kidney disease with stage 5 chronic kidney disease or end stage renal disease: Secondary | ICD-10-CM

## 2021-05-31 DIAGNOSIS — Z8673 Personal history of transient ischemic attack (TIA), and cerebral infarction without residual deficits: Secondary | ICD-10-CM

## 2021-05-31 DIAGNOSIS — I10 Essential (primary) hypertension: Secondary | ICD-10-CM | POA: Diagnosis not present

## 2021-05-31 DIAGNOSIS — N185 Chronic kidney disease, stage 5: Secondary | ICD-10-CM

## 2021-05-31 DIAGNOSIS — J3489 Other specified disorders of nose and nasal sinuses: Secondary | ICD-10-CM | POA: Diagnosis not present

## 2021-05-31 DIAGNOSIS — C9110 Chronic lymphocytic leukemia of B-cell type not having achieved remission: Secondary | ICD-10-CM | POA: Diagnosis not present

## 2021-05-31 DIAGNOSIS — I639 Cerebral infarction, unspecified: Secondary | ICD-10-CM

## 2021-05-31 DIAGNOSIS — E119 Type 2 diabetes mellitus without complications: Secondary | ICD-10-CM | POA: Diagnosis not present

## 2021-05-31 MED ORDER — MUPIROCIN CALCIUM 2 % EX CREA: 1.0000 "application " | TOPICAL_CREAM | Freq: Two times a day (BID) | CUTANEOUS | 0 refills | Status: DC

## 2021-05-31 NOTE — Assessment & Plan Note (Signed)
Managed by hematology/oncology.  According to discharge notes patient is now in hospice care.  Spouse at bedside state [employer] this is the company.  Making at least weekly visits.  They did start patient on oxycodone IR, liquid morphine sulfate and lorazepam.  Continue medication as prescribed from hospice.  Keep follow-up with hospice as prescribed.

## 2021-05-31 NOTE — Assessment & Plan Note (Signed)
Last A1c 6.0 in the hospital.  Currently maintained on diet.  Continue diet controlled approach.

## 2021-05-31 NOTE — Assessment & Plan Note (Signed)
Patient hospitalized on 05-09-2021 and discharged on 05-19-2021 for acute CVA.  MRI showed small acute left thalamocapsular infarct.  Neurology consulted in hospital according to neurology note neurology referral placed for patient to follow-up for stroke.  Patient is hemodialysis patient they did not put her on antiplatelet therapy due to her having thrombocytopenia.

## 2021-05-31 NOTE — Assessment & Plan Note (Signed)
Patient currently maintained on carvedilol, losartan, nifedipine.  Patient's blood pressure is a little above goal in office.  States she does check blood pressure at home twice daily.  Can range from 161W systolic to lower 1 teens systolic asked about how she is taking her medication she says she does split dosing with the carvedilol which is twice daily and every morning dosing with losartan and nifedipine.  If we continue struggling with blood pressure control especially in the a.m. can talk about switching either losartan or nifedipine to p.m. dosing. Continue carvedilol, losartan, nifedipine.

## 2021-05-31 NOTE — Assessment & Plan Note (Signed)
Sore left lateral nostril rim.  Patient is unsure of how this happened.  She does wear oxygen at night asked if nasal cannula was the cause patient does not think so.  Seems to be healing.  Medial side of nostril looks fresher than lateral side.  Try to discourage patient picking, scratching, blowing nose.  Start Bactroban ointment twice daily to aid in healing.

## 2021-05-31 NOTE — Assessment & Plan Note (Signed)
Patient still makes urine per report.  Is managed by nephrology.  She is dialysis TTS last treatment yesterday 8-9- 2022 patient states she tolerated full treatment.

## 2021-05-31 NOTE — Progress Notes (Signed)
Established Patient Office Visit  Subjective:  Patient ID: LANDREE FERNHOLZ, female    DOB: 1951-05-23  Age: 70 y.o. MRN: [id number]  CC:  Chief Complaint  Patient presents with   Hospitalization Follow-up    HPI NOVIA LANSBERRY presents for hospital follow up. She was admitted on 05/09/2021 and discharged on 05/19/2021 for acute cva. MRI showed small acute left thalamocapsular infract. She was evaluated by PT, OT, Speech, Neurology, Nephrology, and hospitalist service. Neurology evaluated and sent referral in for neurology for stroke follow up. She is HD and goes TTS for treatments last treatment was yesterday 05/30/2021. Per patient report patient was able to have full treatment and still has urinary output.  Blood pressure in office above goal. States that she does check her blood pressure at home twice daily and that it ranges from 170s all the way down to 1-teens. She does take her BP medications in the morning minus the coreg which is BID dosing.  Past Medical History:  Diagnosis Date   Chronic kidney disease    Diabetes mellitus without complication ([place])    History of leukemia    Hypertension     Past Surgical History:  Procedure Laterality Date   ABDOMINAL HYSTERECTOMY  1991   partial   CESAREAN SECTION     x 3    Family History  Problem Relation Age of Onset   Hyperlipidemia Mother    Diabetes Mother    Diabetes Father    Hyperlipidemia Father     Social History   Socioeconomic History   Marital status: Married    Spouse name: Not on file   Number of children: Not on file   Years of education: Not on file   Highest education level: Not on file  Occupational History   Not on file  Tobacco Use   Smoking status: Never   Smokeless tobacco: Never  Vaping Use   Vaping Use: Never used  Substance and Sexual Activity   Alcohol use: Yes    Alcohol/week: 0.0 standard drinks    Comment: occassional   Drug use: No   Sexual activity: Yes  Other Topics Concern    Not on file  Social History Narrative   [employer]. L knee pain was service related.  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     Social Determinants of Health   Financial Resource Strain: Not on file  Food Insecurity: Not on file  Transportation Needs: Not on file  Physical Activity: Not on file  Stress: Not on file  Social Connections: Not on file  Intimate Partner Violence: Not on file    Outpatient Medications Prior to Visit  Medication Sig Dispense Refill   acetaminophen (TYLENOL) 325 MG tablet Take 650 mg by mouth every 4 (four) hours as needed for mild pain or fever.     albuterol (VENTOLIN HFA) 108 (90 Base) MCG/ACT inhaler Inhale 1 puff into the lungs every 6 (six) hours as needed for wheezing or shortness of breath.     atorvastatin (LIPITOR) 40 MG tablet Take 40 mg by mouth daily.     benzonatate (TESSALON) 100 MG capsule Take 100 mg by mouth 3 (three) times daily as needed for cough.     carvedilol (COREG) 25 MG tablet Take 25 mg by mouth 2 (two) times daily with a meal.     guaiFENesin (ROBITUSSIN) 100 MG/5ML SOLN Take 15 mLs by mouth every 4 (four) hours as needed for cough or to loosen phlegm.  hydrocortisone cream 1 % Apply 1 application topically 2 (two) times daily.     Hyoscyamine Sulfate SL 0.125 MG SUBL 1 to 2 tablets sublingually every 4 hours as needed for excessive secreations     ipratropium-albuterol (DUONEB) 0.5-2.5 (3) MG/3ML SOLN Take 3 mLs by nebulization in the morning, at noon, and at bedtime.     lidocaine (XYLOCAINE) 5 % ointment Apply 1 application topically 2 (two) times daily as needed (rectal pain).     losartan (COZAAR) 100 MG tablet Take 100 mg by mouth daily.     meclizine (ANTIVERT) 25 MG tablet Take 1 tablet (25 mg total) by mouth 3 (three) times daily as needed for dizziness. 30 tablet 0   melatonin 3 MG TABS tablet Take 3 mg by mouth at bedtime.     morphine (ROXANOL) 20 MG/ML concentrated solution Every 1 hour as needed     NIFEdipine (ADALAT CC) 90 MG 24  hr tablet Take 90 mg by mouth daily.     ondansetron (ZOFRAN) 4 MG tablet Take 4 mg by mouth every 8 (eight) hours as needed.     oxyCODONE (OXY IR/ROXICODONE) 5 MG immediate release tablet Take 5 mg by mouth every 4 (four) hours as needed for pain.     sertraline (ZOLOFT) 100 MG tablet Take 100 mg by mouth daily.     Zanubrutinib (BRUKINSA) 80 MG CAPS Take 2 capsules by mouth 3 (three) times a week. Tues,Thur. Sat     furosemide (LASIX) 80 MG tablet Take 80 mg by mouth daily.     hydrALAZINE (APRESOLINE) 25 MG tablet Take 25 mg by mouth 3 (three) times daily.      No facility-administered medications prior to visit.    Allergies  Allergen Reactions   Lisinopril Cough    ROS Review of Systems  Constitutional:  Negative for chills, fatigue and fever.  HENT:  Positive for hearing loss.   Respiratory:  Positive for choking. Negative for shortness of breath and wheezing.   Cardiovascular:  Negative for chest pain.  Gastrointestinal:  Negative for diarrhea, nausea and vomiting.  Genitourinary:  Negative for difficulty urinating.  Skin:  Positive for wound.  Neurological:  Positive for headaches. Negative for weakness and numbness.     Objective:    Physical Exam Vitals and nursing note reviewed. Exam conducted with a chaperone present (Anastasiya).  Constitutional:      Appearance: Normal appearance. She is normal weight.  HENT:     Right Ear: Tympanic membrane, ear canal and external ear normal. There is no impacted cerumen.     Left Ear: Tympanic membrane, ear canal and external ear normal. There is no impacted cerumen.     Nose:     Comments: Sore to left outer nostril rim Eyes:     Extraocular Movements: Extraocular movements intact.     Pupils: Pupils are equal, round, and reactive to light.  Cardiovascular:     Rate and Rhythm: Normal rate and regular rhythm.     Pulses:          Dorsalis pedis pulses are 2+ on the right side and 2+ on the left side.     Arteriovenous  access: Left arteriovenous access is present.    Comments: Pitting edema to bilateral feet.  Bruit and thrill present on fistula Pulmonary:     Effort: Pulmonary effort is normal.     Breath sounds: Normal breath sounds.  Abdominal:     General: Bowel sounds are normal.  Musculoskeletal:     Right lower leg: 1+ Pitting Edema present.     Left lower leg: 1+ Pitting Edema present.     Comments: 5/5 bilateral upper and lower extremity strength   Lymphadenopathy:     Cervical: No cervical adenopathy.  Skin:    General: Skin is warm.  Neurological:     General: No focal deficit present.     Mental Status: She is alert.  Psychiatric:        Mood and Affect: Mood normal.        Behavior: Behavior normal.        Thought Content: Thought content normal.        Judgment: Judgment normal.    BP (!) 154/60   Pulse 76   Temp (!) 96.8 F (36 C)   Resp 18   Ht 4\' 11"  (1.499 m)   Wt 98 lb (44.5 kg)   LMP  (LMP Unknown)   SpO2 97%   BMI 19.79 kg/m  Wt Readings from Last 3 Encounters:  05/31/21 98 lb (44.5 kg)  05/19/21 94 lb 12.8 oz (43 kg)  01/06/21 110 lb (49.9 kg)     Health Maintenance Due  Topic Date Due   Zoster Vaccines- Shingrix (1 of 2) Never done   COLONOSCOPY (Pts 45-83yrs Insurance coverage will need to be confirmed)  Never done   OPHTHALMOLOGY EXAM  09/04/2017   FOOT EXAM  03/11/2018   MAMMOGRAM  05/17/2018   COVID-19 Vaccine (5 - Booster for [place] series) 04/28/2021   INFLUENZA VACCINE  05/22/2021    There are no preventive care reminders to display for this patient.  No results found for: TSH Lab Results  Component Value Date   WBC 21.9 (H) 05/18/2021   HGB 8.3 (L) 05/18/2021   HCT 25.3 (L) 05/18/2021   MCV 83.5 05/18/2021   PLT 44 (L) 05/18/2021   Lab Results  Component Value Date   NA 136 05/18/2021   K 4.0 05/18/2021   CO2 22 05/18/2021   GLUCOSE 118 (H) 05/18/2021   BUN 98 (H) 05/18/2021   CREATININE 5.93 (H) 05/18/2021   BILITOT 0.5  01/06/2021   ALKPHOS 53 01/12/2019   AST 17 01/06/2021   ALT 17 01/06/2021   PROT 5.8 (L) 01/06/2021   ALBUMIN 2.6 (L) 05/18/2021   CALCIUM 7.5 (L) 05/18/2021   ANIONGAP 14 05/18/2021   Lab Results  Component Value Date   CHOL 112 05/10/2021   Lab Results  Component Value Date   HDL 18 (L) 05/10/2021   Lab Results  Component Value Date   LDLCALC 67 05/10/2021   Lab Results  Component Value Date   TRIG 136 05/10/2021   Lab Results  Component Value Date   CHOLHDL 6.2 05/10/2021   Lab Results  Component Value Date   HGBA1C 6.0 (H) 05/10/2021      Assessment & Plan:   Problem List Items Addressed This Visit       Cardiovascular and Mediastinum   HTN (hypertension)    Patient currently maintained on carvedilol, losartan, nifedipine.  Patient's blood pressure is a little above goal in office.  States she does check blood pressure at home twice daily.  Can range from 035W systolic to lower 1 teens systolic asked about how she is taking her medication she says she does split dosing with the carvedilol which is twice daily and every morning dosing with losartan and nifedipine.  If we continue struggling with blood pressure control especially  in the a.m. can talk about switching either losartan or nifedipine to p.m. dosing. Continue carvedilol, losartan, nifedipine.       Acute CVA (cerebrovascular accident) [facility])    Patient hospitalized on 05-09-2021 and discharged on 05-19-2021 for acute CVA.  MRI showed small acute left thalamocapsular infarct.  Neurology consulted in hospital according to neurology note neurology referral placed for patient to follow-up for stroke.  Patient is hemodialysis patient they did not put her on antiplatelet therapy due to her having thrombocytopenia.         Endocrine   DM (diabetes mellitus), type 2 ([facility])    Last A1c 6.0 in the hospital.  Currently maintained on diet.  Continue diet controlled approach.         Genitourinary   CKD stage  5 secondary to hypertension [facility])    Patient still makes urine per report.  Is managed by nephrology.  She is dialysis TTS last treatment yesterday 8-9- 2022 patient states she tolerated full treatment.         Other   CLL (chronic lymphocytic leukemia) ([place])    Managed by hematology/oncology.  According to discharge notes patient is now in hospice care.  Spouse at bedside state [employer] this is the company.  Making at least weekly visits.  They did start patient on oxycodone IR, liquid morphine sulfate and lorazepam.  Continue medication as prescribed from hospice.  Keep follow-up with hospice as prescribed.       Relevant Medications   morphine (ROXANOL) 20 MG/ML concentrated solution   ondansetron (ZOFRAN) 4 MG tablet   Sore in nostril    Sore left lateral nostril rim.  Patient is unsure of how this happened.  She does wear oxygen at night asked if nasal cannula was the cause patient does not think so.  Seems to be healing.  Medial side of nostril looks fresher than lateral side.  Try to discourage patient picking, scratching, blowing nose.  Start Bactroban ointment twice daily to aid in healing.       Relevant Medications   mupirocin cream (BACTROBAN) 2 %   Other Visit Diagnoses     Hospital discharge follow-up    -  Primary       No orders of the defined types were placed in this encounter.   Follow-up: Return in about 2 months (around 07/31/2021) for Recheck.    Romilda Garret, NP

## 2021-06-01 ENCOUNTER — Other Ambulatory Visit: Payer: Self-pay | Admitting: Nurse Practitioner

## 2021-06-01 DIAGNOSIS — H900 Conductive hearing loss, bilateral: Secondary | ICD-10-CM | POA: Diagnosis not present

## 2021-06-01 DIAGNOSIS — J3489 Other specified disorders of nose and nasal sinuses: Secondary | ICD-10-CM

## 2021-06-01 MED ORDER — BACTROBAN NASAL 2 % NA OINT: 1.0000 "application " | TOPICAL_OINTMENT | Freq: Two times a day (BID) | NASAL | 0 refills | Status: DC

## 2021-06-23 DIAGNOSIS — C9112 Chronic lymphocytic leukemia of B-cell type in relapse: Principal | ICD-10-CM

## 2021-06-26 ENCOUNTER — Inpatient Hospital Stay (HOSPITAL_COMMUNITY): Payer: Medicare Other

## 2021-06-26 ENCOUNTER — Inpatient Hospital Stay (HOSPITAL_COMMUNITY)
Admission: EM | Admit: 2021-06-26 | Discharge: 2021-07-21 | DRG: 177 | Disposition: A | Discharge status: Skilled Nursing Facility | Payer: Medicare Other | Attending: Internal Medicine | Admitting: Internal Medicine

## 2021-06-26 ENCOUNTER — Emergency Department (HOSPITAL_COMMUNITY): Payer: Medicare Other

## 2021-06-26 DIAGNOSIS — Z515 Encounter for palliative care: Secondary | ICD-10-CM | POA: Diagnosis not present

## 2021-06-26 DIAGNOSIS — R531 Weakness: Secondary | ICD-10-CM | POA: Diagnosis not present

## 2021-06-26 DIAGNOSIS — R4182 Altered mental status, unspecified: Secondary | ICD-10-CM | POA: Diagnosis not present

## 2021-06-26 DIAGNOSIS — G40909 Epilepsy, unspecified, not intractable, without status epilepticus: Secondary | ICD-10-CM | POA: Diagnosis present

## 2021-06-26 DIAGNOSIS — Z992 Dependence on renal dialysis: Secondary | ICD-10-CM | POA: Diagnosis not present

## 2021-06-26 DIAGNOSIS — I959 Hypotension, unspecified: Secondary | ICD-10-CM | POA: Diagnosis not present

## 2021-06-26 DIAGNOSIS — I10 Essential (primary) hypertension: Secondary | ICD-10-CM

## 2021-06-26 DIAGNOSIS — A630 Anogenital (venereal) warts: Secondary | ICD-10-CM | POA: Diagnosis present

## 2021-06-26 DIAGNOSIS — R52 Pain, unspecified: Secondary | ICD-10-CM | POA: Diagnosis not present

## 2021-06-26 DIAGNOSIS — R509 Fever, unspecified: Secondary | ICD-10-CM | POA: Diagnosis not present

## 2021-06-26 DIAGNOSIS — I639 Cerebral infarction, unspecified: Secondary | ICD-10-CM

## 2021-06-26 DIAGNOSIS — R61 Generalized hyperhidrosis: Secondary | ICD-10-CM | POA: Diagnosis not present

## 2021-06-26 DIAGNOSIS — D696 Thrombocytopenia, unspecified: Secondary | ICD-10-CM | POA: Diagnosis not present

## 2021-06-26 DIAGNOSIS — Z8616 Personal history of COVID-19: Secondary | ICD-10-CM

## 2021-06-26 DIAGNOSIS — N186 End stage renal disease: Secondary | ICD-10-CM | POA: Diagnosis not present

## 2021-06-26 DIAGNOSIS — D6959 Other secondary thrombocytopenia: Secondary | ICD-10-CM | POA: Diagnosis present

## 2021-06-26 DIAGNOSIS — E785 Hyperlipidemia, unspecified: Secondary | ICD-10-CM | POA: Diagnosis present

## 2021-06-26 DIAGNOSIS — R5381 Other malaise: Secondary | ICD-10-CM | POA: Diagnosis not present

## 2021-06-26 DIAGNOSIS — Z8673 Personal history of transient ischemic attack (TIA), and cerebral infarction without residual deficits: Secondary | ICD-10-CM | POA: Diagnosis not present

## 2021-06-26 DIAGNOSIS — U071 COVID-19: Secondary | ICD-10-CM | POA: Diagnosis not present

## 2021-06-26 DIAGNOSIS — I610 Nontraumatic intracerebral hemorrhage in hemisphere, subcortical: Secondary | ICD-10-CM | POA: Diagnosis not present

## 2021-06-26 DIAGNOSIS — Z681 Body mass index (BMI) 19 or less, adult: Secondary | ICD-10-CM | POA: Diagnosis not present

## 2021-06-26 DIAGNOSIS — Z833 Family history of diabetes mellitus: Secondary | ICD-10-CM

## 2021-06-26 DIAGNOSIS — I619 Nontraumatic intracerebral hemorrhage, unspecified: Secondary | ICD-10-CM | POA: Diagnosis not present

## 2021-06-26 DIAGNOSIS — E1122 Type 2 diabetes mellitus with diabetic chronic kidney disease: Secondary | ICD-10-CM | POA: Diagnosis present

## 2021-06-26 DIAGNOSIS — I120 Hypertensive chronic kidney disease with stage 5 chronic kidney disease or end stage renal disease: Secondary | ICD-10-CM | POA: Diagnosis not present

## 2021-06-26 DIAGNOSIS — I313 Pericardial effusion (noninflammatory): Secondary | ICD-10-CM | POA: Diagnosis not present

## 2021-06-26 DIAGNOSIS — R569 Unspecified convulsions: Secondary | ICD-10-CM | POA: Diagnosis not present

## 2021-06-26 DIAGNOSIS — Z79899 Other long term (current) drug therapy: Secondary | ICD-10-CM

## 2021-06-26 DIAGNOSIS — Z743 Need for continuous supervision: Secondary | ICD-10-CM | POA: Diagnosis not present

## 2021-06-26 DIAGNOSIS — E119 Type 2 diabetes mellitus without complications: Secondary | ICD-10-CM | POA: Diagnosis not present

## 2021-06-26 DIAGNOSIS — R627 Adult failure to thrive: Secondary | ICD-10-CM | POA: Diagnosis present

## 2021-06-26 DIAGNOSIS — D631 Anemia in chronic kidney disease: Secondary | ICD-10-CM | POA: Diagnosis not present

## 2021-06-26 DIAGNOSIS — G9340 Encephalopathy, unspecified: Secondary | ICD-10-CM | POA: Diagnosis not present

## 2021-06-26 DIAGNOSIS — M6281 Muscle weakness (generalized): Secondary | ICD-10-CM | POA: Diagnosis not present

## 2021-06-26 DIAGNOSIS — J1282 Pneumonia due to coronavirus disease 2019: Secondary | ICD-10-CM | POA: Diagnosis not present

## 2021-06-26 DIAGNOSIS — R64 Cachexia: Secondary | ICD-10-CM | POA: Diagnosis present

## 2021-06-26 DIAGNOSIS — Z83438 Family history of other disorder of lipoprotein metabolism and other lipidemia: Secondary | ICD-10-CM

## 2021-06-26 DIAGNOSIS — E43 Unspecified severe protein-calorie malnutrition: Secondary | ICD-10-CM | POA: Diagnosis not present

## 2021-06-26 DIAGNOSIS — C9110 Chronic lymphocytic leukemia of B-cell type not having achieved remission: Secondary | ICD-10-CM | POA: Diagnosis present

## 2021-06-26 DIAGNOSIS — N2581 Secondary hyperparathyroidism of renal origin: Secondary | ICD-10-CM | POA: Diagnosis present

## 2021-06-26 DIAGNOSIS — Z7189 Other specified counseling: Secondary | ICD-10-CM

## 2021-06-26 DIAGNOSIS — J690 Pneumonitis due to inhalation of food and vomit: Secondary | ICD-10-CM | POA: Diagnosis not present

## 2021-06-26 DIAGNOSIS — J189 Pneumonia, unspecified organism: Secondary | ICD-10-CM

## 2021-06-26 DIAGNOSIS — R0902 Hypoxemia: Secondary | ICD-10-CM | POA: Diagnosis not present

## 2021-06-26 DIAGNOSIS — I700 Atherosclerosis of aorta: Secondary | ICD-10-CM | POA: Diagnosis not present

## 2021-06-26 DIAGNOSIS — Z66 Do not resuscitate: Secondary | ICD-10-CM | POA: Diagnosis not present

## 2021-06-26 DIAGNOSIS — H9190 Unspecified hearing loss, unspecified ear: Secondary | ICD-10-CM | POA: Diagnosis present

## 2021-06-26 DIAGNOSIS — J9811 Atelectasis: Secondary | ICD-10-CM | POA: Diagnosis not present

## 2021-06-26 DIAGNOSIS — Z0184 Encounter for antibody response examination: Secondary | ICD-10-CM

## 2021-06-26 DIAGNOSIS — Z888 Allergy status to other drugs, medicaments and biological substances status: Secondary | ICD-10-CM

## 2021-06-26 DIAGNOSIS — I69391 Dysphagia following cerebral infarction: Secondary | ICD-10-CM | POA: Diagnosis not present

## 2021-06-26 DIAGNOSIS — D630 Anemia in neoplastic disease: Secondary | ICD-10-CM | POA: Diagnosis present

## 2021-06-26 DIAGNOSIS — J3489 Other specified disorders of nose and nasal sinuses: Secondary | ICD-10-CM | POA: Diagnosis not present

## 2021-06-26 DIAGNOSIS — N250 Renal osteodystrophy: Secondary | ICD-10-CM | POA: Diagnosis not present

## 2021-06-26 DIAGNOSIS — G9341 Metabolic encephalopathy: Secondary | ICD-10-CM | POA: Diagnosis present

## 2021-06-26 DIAGNOSIS — I69320 Aphasia following cerebral infarction: Secondary | ICD-10-CM | POA: Diagnosis not present

## 2021-06-26 DIAGNOSIS — M898X9 Other specified disorders of bone, unspecified site: Secondary | ICD-10-CM | POA: Diagnosis present

## 2021-06-26 DIAGNOSIS — R279 Unspecified lack of coordination: Secondary | ICD-10-CM | POA: Diagnosis not present

## 2021-06-26 HISTORY — DX: End stage renal disease: N18.6

## 2021-06-26 LAB — CBC WITH DIFFERENTIAL/PLATELET
Abs Immature Granulocytes: 0.04 10*3/uL (ref 0.00–0.07)
Basophils Absolute: 0 10*3/uL (ref 0.0–0.1)
Basophils Relative: 1 %
Eosinophils Absolute: 0.1 10*3/uL (ref 0.0–0.5)
Eosinophils Relative: 2 %
HCT: 35.5 % — ABNORMAL LOW (ref 36.0–46.0)
Hemoglobin: 10.6 g/dL — ABNORMAL LOW (ref 12.0–15.0)
Immature Granulocytes: 1 %
Lymphocytes Relative: 60 %
Lymphs Abs: 2.4 10*3/uL (ref 0.7–4.0)
MCH: 27.2 pg (ref 26.0–34.0)
MCHC: 29.9 g/dL — ABNORMAL LOW (ref 30.0–36.0)
MCV: 91.3 fL (ref 80.0–100.0)
Monocytes Absolute: 0.6 10*3/uL (ref 0.1–1.0)
Monocytes Relative: 14 %
Neutro Abs: 0.9 10*3/uL — ABNORMAL LOW (ref 1.7–7.7)
Neutrophils Relative %: 22 %
Platelets: 42 10*3/uL — ABNORMAL LOW (ref 150–400)
RBC: 3.89 MIL/uL (ref 3.87–5.11)
RDW: 18.1 % — ABNORMAL HIGH (ref 11.5–15.5)
WBC: 4 10*3/uL (ref 4.0–10.5)
nRBC: 1 % — ABNORMAL HIGH (ref 0.0–0.2)

## 2021-06-26 LAB — LACTIC ACID, PLASMA
Lactic Acid, Venous: 2.8 mmol/L (ref 0.5–1.9)
Lactic Acid, Venous: 1.1 mmol/L (ref 0.5–1.9)

## 2021-06-26 LAB — COMPREHENSIVE METABOLIC PANEL
ALT: 12 U/L (ref 0–44)
AST: 26 U/L (ref 15–41)
Albumin: 2.4 g/dL — ABNORMAL LOW (ref 3.5–5.0)
Alkaline Phosphatase: 53 U/L (ref 38–126)
Anion gap: 14 (ref 5–15)
BUN: 35 mg/dL — ABNORMAL HIGH (ref 8–23)
CO2: 25 mmol/L (ref 22–32)
Calcium: 8.3 mg/dL — ABNORMAL LOW (ref 8.9–10.3)
Chloride: 103 mmol/L (ref 98–111)
Creatinine, Ser: 6.5 mg/dL — ABNORMAL HIGH (ref 0.44–1.00)
GFR, Estimated: 6 mL/min — ABNORMAL LOW (ref >60)
Glucose, Bld: 134 mg/dL — ABNORMAL HIGH (ref 70–99)
Potassium: 4.5 mmol/L (ref 3.5–5.1)
Sodium: 142 mmol/L (ref 135–145)
Total Bilirubin: 0.5 mg/dL (ref 0.3–1.2)
Total Protein: 4.6 g/dL — ABNORMAL LOW (ref 6.5–8.1)

## 2021-06-26 LAB — PROTIME-INR
INR: 1.2 (ref 0.8–1.2)
Prothrombin Time: 15.4 seconds — ABNORMAL HIGH (ref 11.4–15.2)

## 2021-06-26 LAB — APTT: aPTT: 31 seconds (ref 24–36)

## 2021-06-26 LAB — D-DIMER, QUANTITATIVE: D-Dimer, Quant: 0.8 ug/mL-FEU — ABNORMAL HIGH (ref 0.00–0.50)

## 2021-06-26 LAB — RESP PANEL BY RT-PCR (FLU A&B, COVID) ARPGX2
Influenza A by PCR: NEGATIVE
Influenza B by PCR: NEGATIVE
SARS Coronavirus 2 by RT PCR: POSITIVE — AB

## 2021-06-26 LAB — PROCALCITONIN: Procalcitonin: 0.93 ng/mL

## 2021-06-26 LAB — C-REACTIVE PROTEIN: CRP: 9.5 mg/dL — ABNORMAL HIGH (ref <1.0)

## 2021-06-26 LAB — AMMONIA: Ammonia: 34 umol/L (ref 9–35)

## 2021-06-26 IMAGING — MR MR HEAD W/O CM
6 of 11 series · 24 of 48 positions shown · non-contrast
Comparison: CT from [DATE] as well as previous exams from
[DATE] and [DATE].

CLINICAL DATA: Initial evaluation for altered mental status, neuro
deficit, stroke suspected.

EXAM:
MRI HEAD WITHOUT CONTRAST
TECHNIQUE: Multiplanar, multiecho pulse sequences of the brain and surrounding
structures were obtained without intravenous contrast.

[Series 2: DWI · axial · 3.0mm · 0.94mm/px · z∈[-126,+20]mm · 7 of 100 slices shown (1 of 2)]
[im 1/100]
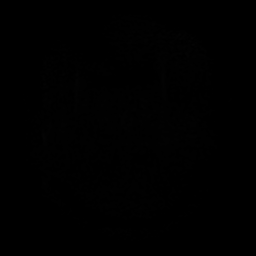
[im 17/100]
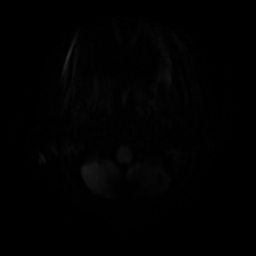
[im 34/100]
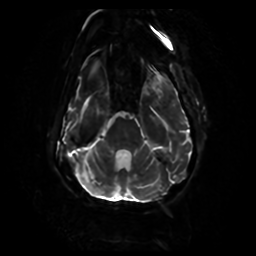
[im 50/100]
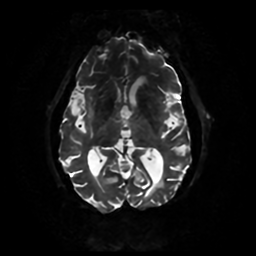
[im 67/100]
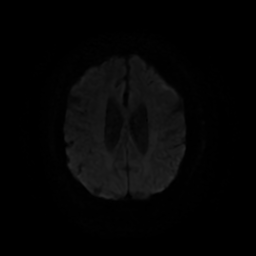
[im 83/100]
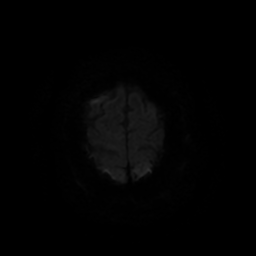
[im 100/100]
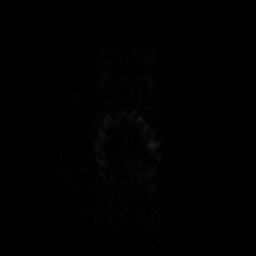

[Series 3: DWI · coronal · 4.0mm · 0.94mm/px · 5 of 74 slices shown (2 of 2)]
[im 1/74]
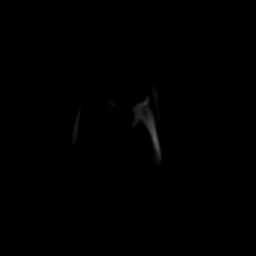
[im 19/74]
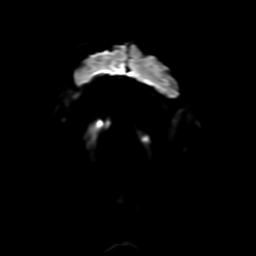
[im 37/74]
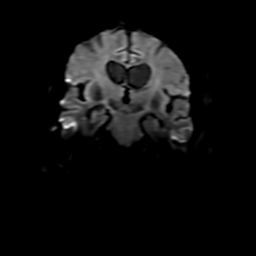
[im 55/74]
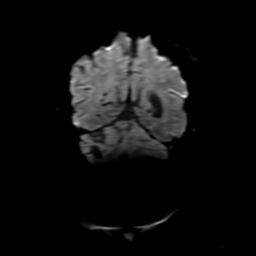
[im 74/74]
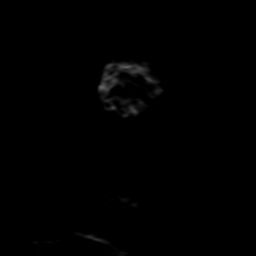

[Series 4: FLAIR · axial · 4.0mm · 0.45mm/px · z∈[-127,+21]mm · 3 of 35 slices shown (1 of 2)]
[im 1/35]
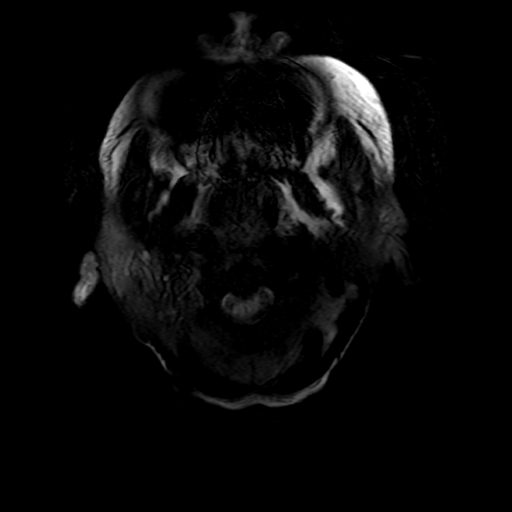
[im 18/35]
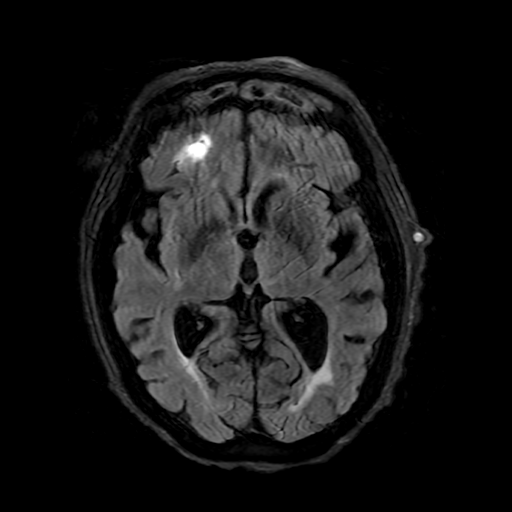
[im 35/35]
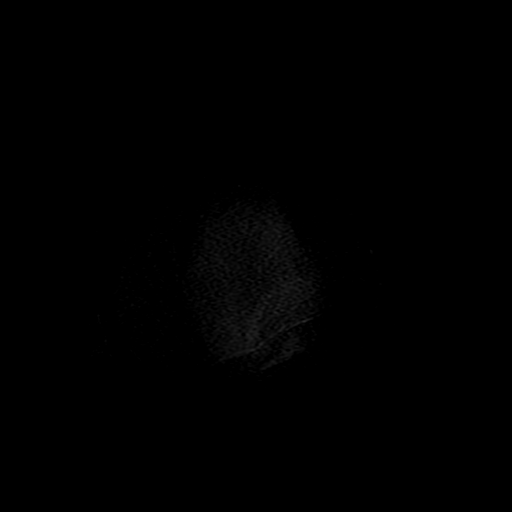

[Series 6: FLAIR · sagittal · 5.0mm · 0.23mm/px · 2 of 23 slices shown (2 of 2)]
[im 1/23]
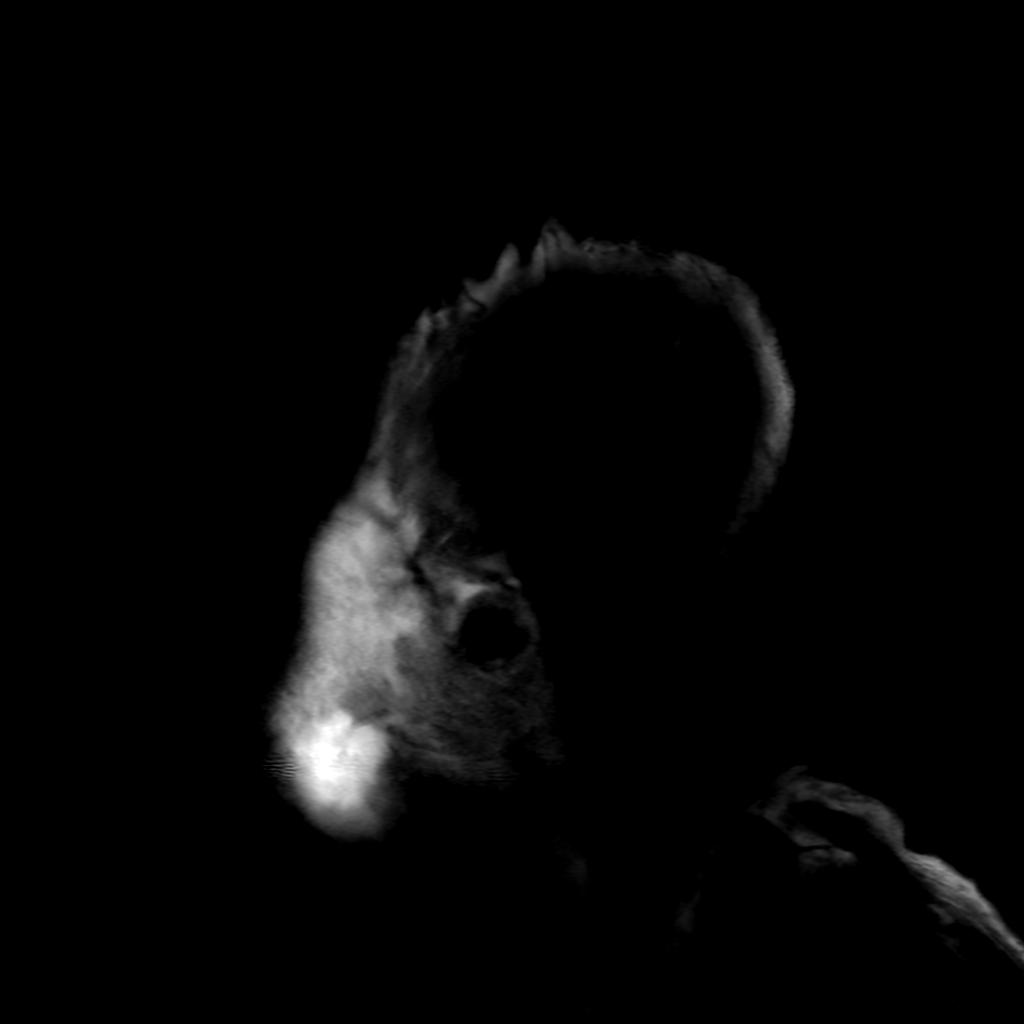
[im 23/23]
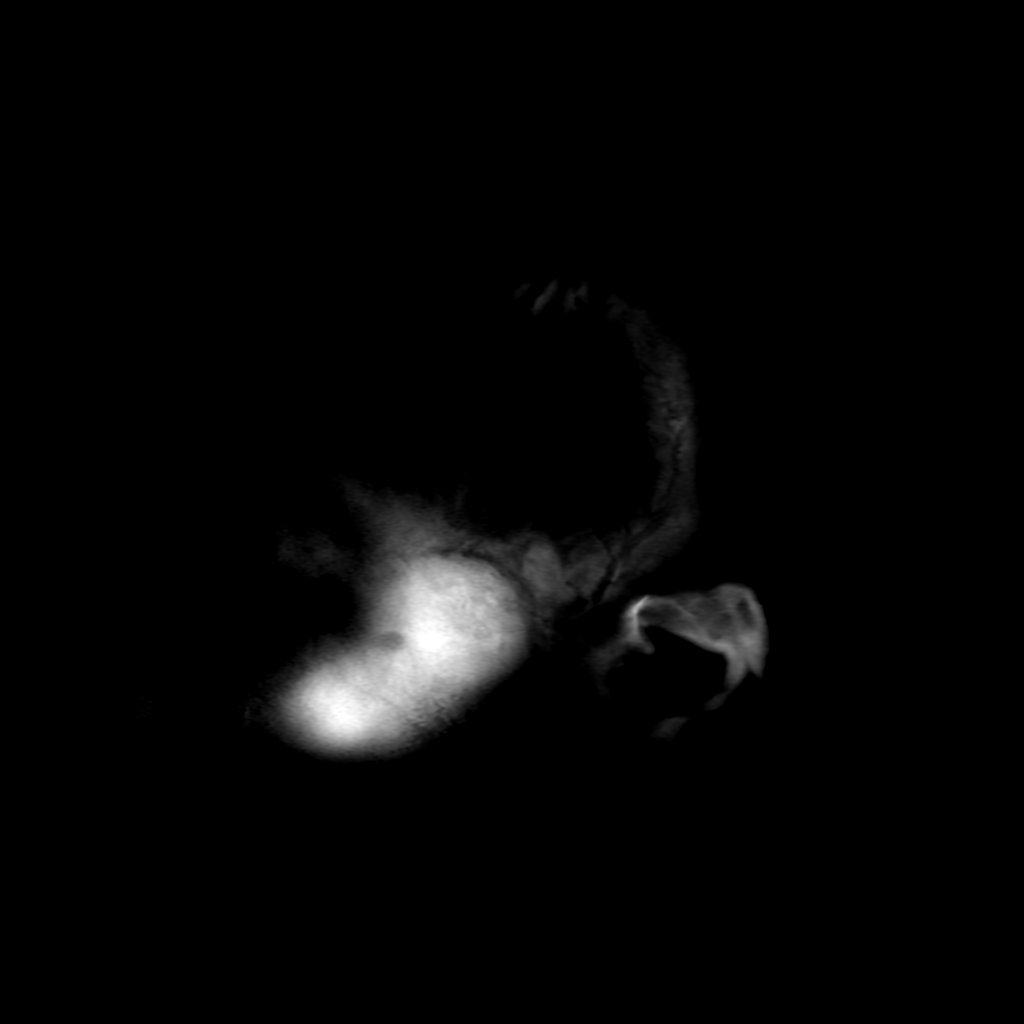

[Series 250: ADC · axial · 3.0mm · 0.94mm/px · z∈[-126,+20]mm · 4 of 50 slices shown (1 of 2)]
[im 1/50]
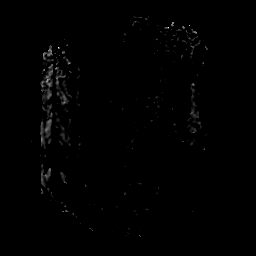
[im 17/50]
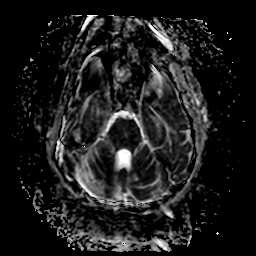
[im 33/50]
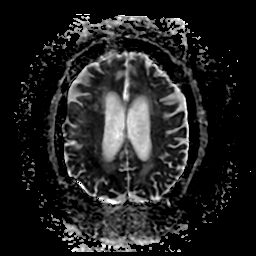
[im 50/50]
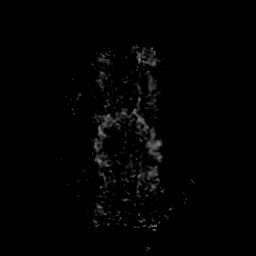

[Series 350: ADC · coronal · 4.0mm · 0.94mm/px · 3 of 37 slices shown (2 of 2)]
[im 1/37]
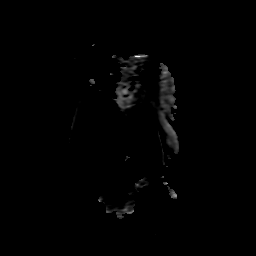
[im 19/37]
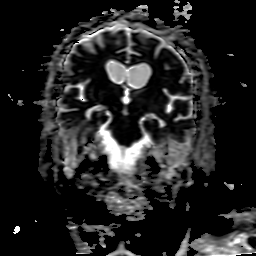
[im 37/37]
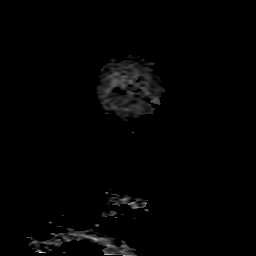

[24 of 48 positions shown; findings below may reference images not displayed]

FINDINGS: Brain: Examination moderately degraded by motion artifact, limiting
assessment.

Diffuse prominence of the CSF containing spaces compatible with
generalized age-related cerebral atrophy. Patchy T2/FLAIR
hyperintensity about the periventricular and deep white matter both
cerebral hemispheres most consistent with chronic microvascular
ischemic disease, moderate in nature, and grossly stable from
previous. Superimposed small remote left thalamic lacunar infarct
with chronic hemosiderin staining. Scattered remote bilateral
cerebellar infarcts again seen, right greater than left. Associated
susceptibility artifact consistent with dystrophic calcification
and/or chronic blood products as seen on prior studies, stable.

There is a 2 cm focus of FLAIR and T1 hyperintensity involving the
anterior/inferior aspect of the right frontal lobe (series 4, image
17), new as compared to prior MRI. In comparison with prior CT,
there appears to be a subtle small extra-axial hemorrhage and
probable parenchymal contusion at this location (series 5, image 16
on prior CT). This is suspected to be subacute in nature. No
significant regional mass effect or edema.

No other evidence for acute or subacute infarct. Gray-white matter
differentiation otherwise maintained. No other evidence for acute or
chronic intracranial hemorrhage.

No mass lesion, midline shift or mass effect. No hydrocephalus. No
other visible extra-axial fluid collection. Pituitary gland
suprasellar region grossly normal. Midline structures grossly
intact.

Vascular: Major intracranial vascular flow voids are grossly
maintained.

Skull and upper cervical spine: Craniocervical junction grossly
within normal limits. Bone marrow signal intensity diffusely
heterogeneous. No visible focal marrow replacing lesion seen on this
motion degraded exam. No visible scalp soft tissue abnormality.

Sinuses/Orbits: Globes and orbital soft tissues demonstrate no acute
finding. Extensive pan sinusitis with large bilateral mastoid
effusions noted. Inner ear structures grossly normal.

Other: None.
IMPRESSION: 1. Motion degraded exam.
2. 2 cm focus of FLAIR and T1 hyperintensity involving the
anterior/inferior right frontal lobe, new as compared to recent MRI
from [DATE]. In comparison with prior CT from [DATE], there
appears to be a small extra-axial hemorrhage and probable
parenchymal contusion at this location. This is likely subacute in
nature without significant regional mass effect or edema.
Correlation with history for possible recent trauma suggested.
Appearance would be atypical and unusual for intracranial spread of
infection from adjacent paranasal sinus disease, which would be the
primary differential consideration. Short interval follow-up MRI in
2-3 months to ensure these changes resolve is suggested. If there is
concern for possible intracranial infection, then correlation with
dedicated LP and CSF studies could be performed as warranted.
3. No other acute intracranial abnormality.
4. Underlying chronic microvascular ischemic disease with multiple
remote cerebellar and left thalamic infarcts, stable.
5. Extensive pan sinusitis with large bilateral mastoid effusions.

Results were communicated by telephone at the time of interpretation
on [DATE] at [DATE] to provider BRUST.

## 2021-06-26 IMAGING — CR DG CHEST 1V
1 series · 1 of 1 positions shown · non-contrast
Comparison: Chest x-ray [DATE].

CLINICAL DATA: Fever.

EXAM:
CHEST  1 VIEW

[chest ap]
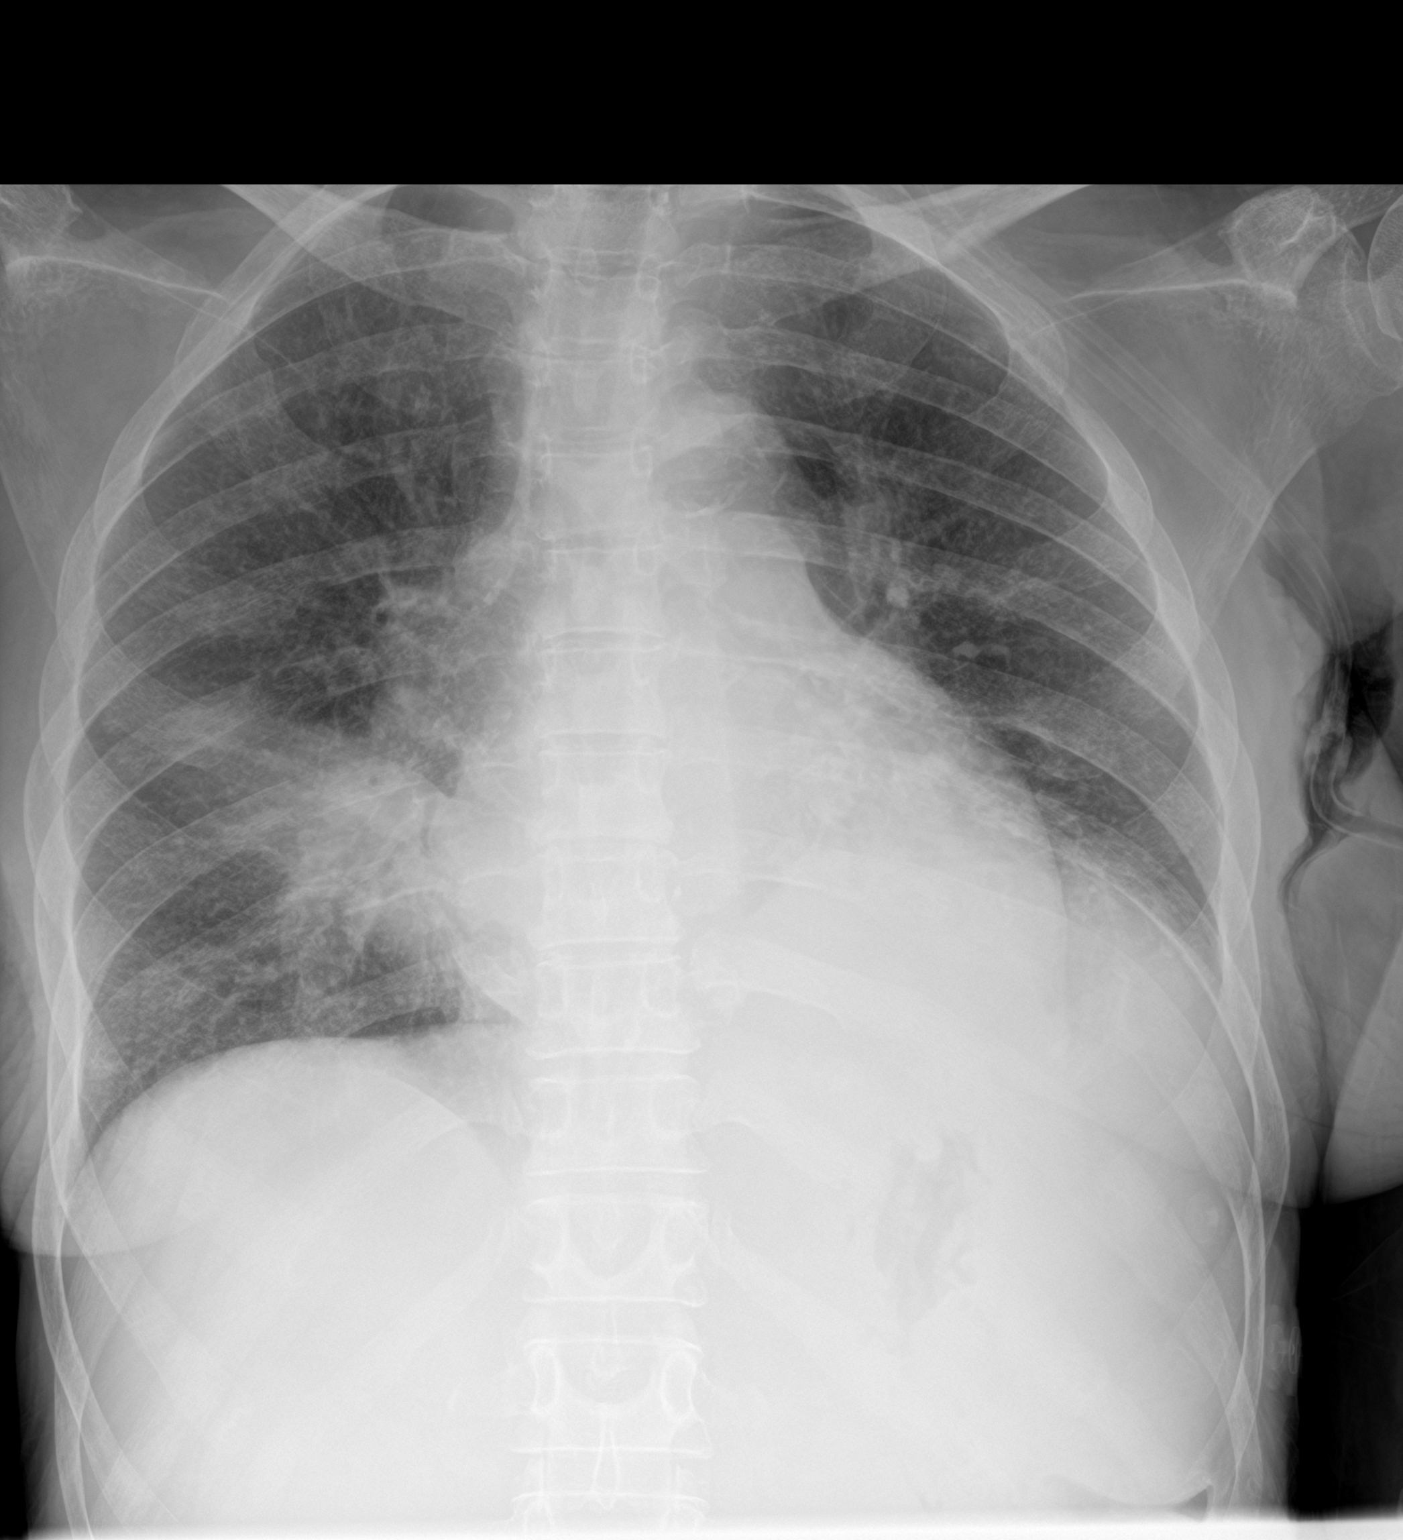

[1 of 1 positions shown; findings below may reference images not displayed]

FINDINGS: Cardiac silhouette is enlarged, unchanged. Small left pleural
effusion and patchy opacities in the left lung base persists. There
is new focal opacity in medial right mid lung overlying the right
hilar region. There may also be some new right hilar enlargement.
There is no evidence for pneumothorax. No acute fractures are seen.
IMPRESSION: 1. New right midlung infiltrate. Questionable new right hilar
enlargement. Findings may be related to pneumonia with right hilar
lymphadenopathy. Short-term follow-up two views chest and/or CT
recommended to exclude underlying mass at this level.
2. Stable cardiomegaly.
3. Stable small left pleural effusion with left basilar
atelectasis/airspace disease.

## 2021-06-26 IMAGING — CT CT HEAD W/O CM
3 series · 15 of 47 positions shown, 18 images · non-contrast
Comparison: [DATE]

CLINICAL DATA: Altered mental status.

EXAM:
CT HEAD WITHOUT CONTRAST
TECHNIQUE: Contiguous axial images were obtained from the base of the skull
through the vertex without intravenous contrast.

[Series 4: head 5.0 h30s · axial · 0.43mm/px · z∈[-78,+52]mm · 9 of 32 slices shown, 12 images]
[im 3/32  brain]
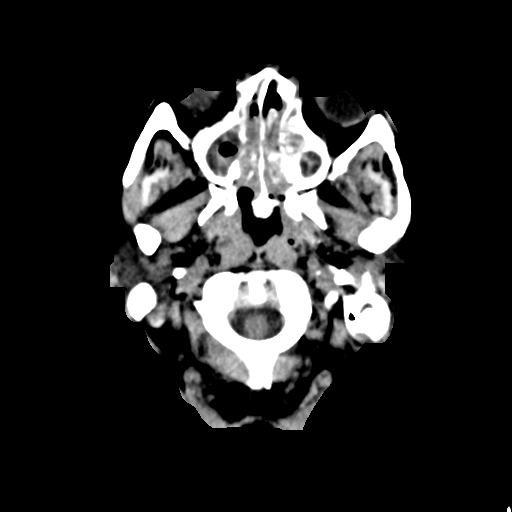
[im 3/32  bone]
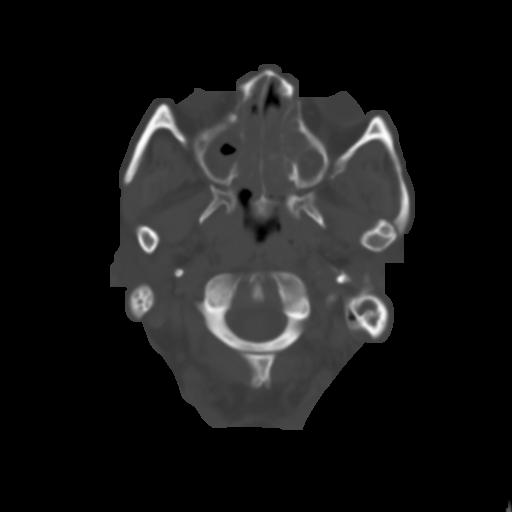
[im 6/32  brain]
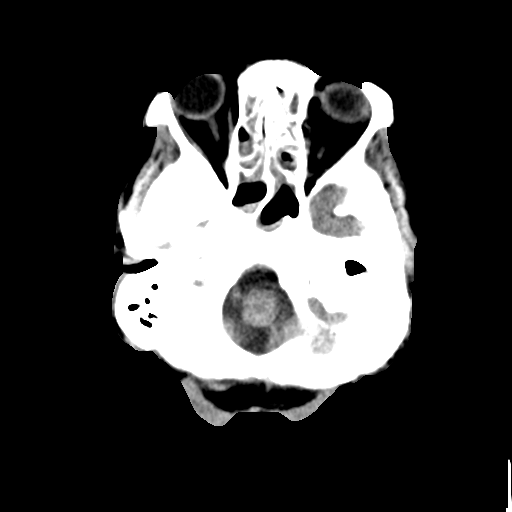
[im 9/32  brain]
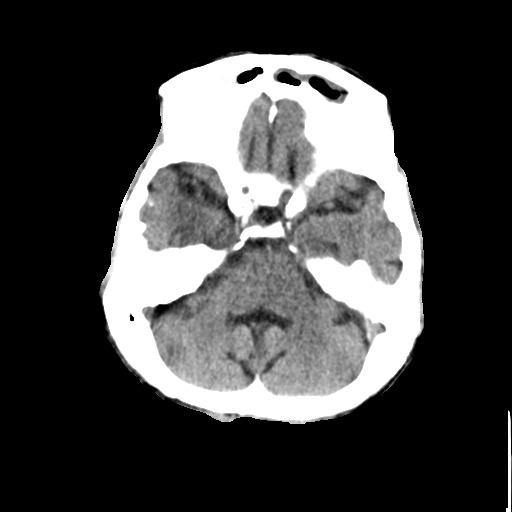
[im 12/32  brain]
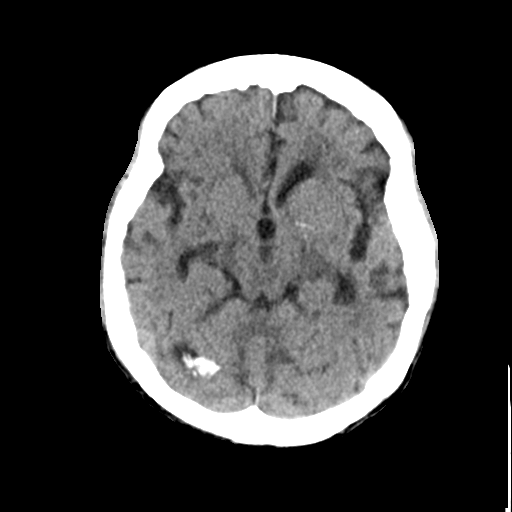
[im 17/32  brain]
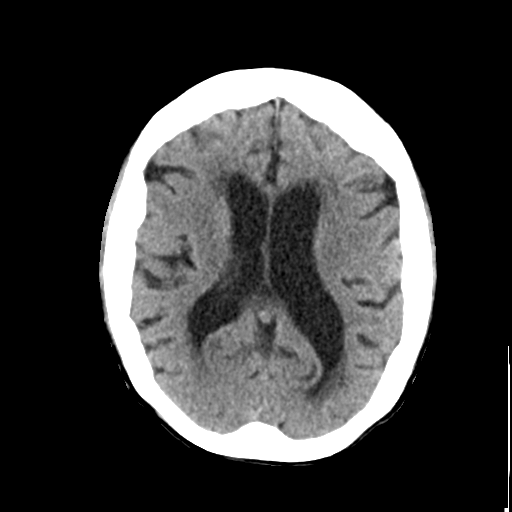
[im 17/32  bone]
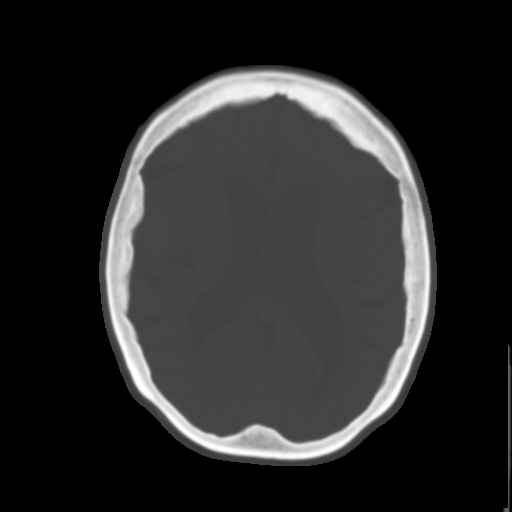
[im 20/32  brain]
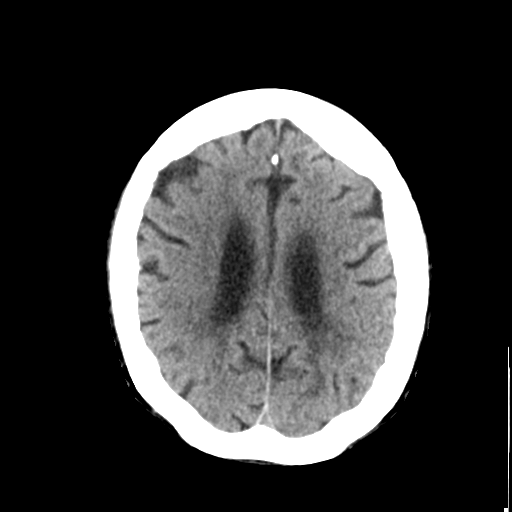
[im 23/32  brain]
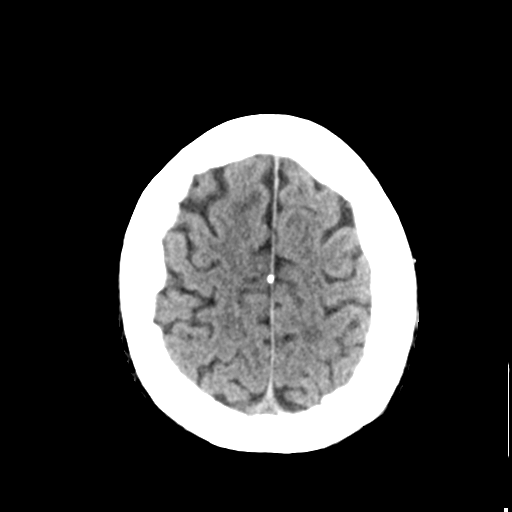
[im 26/32  brain]
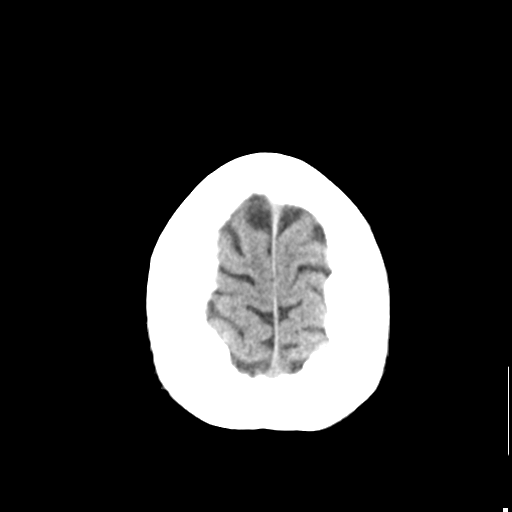
[im 29/32  brain]
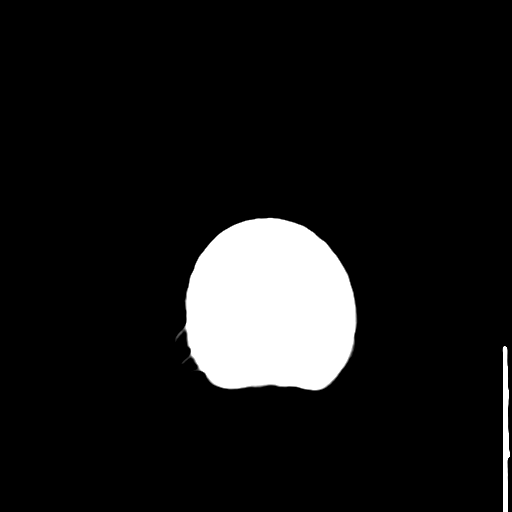
[im 29/32  bone]
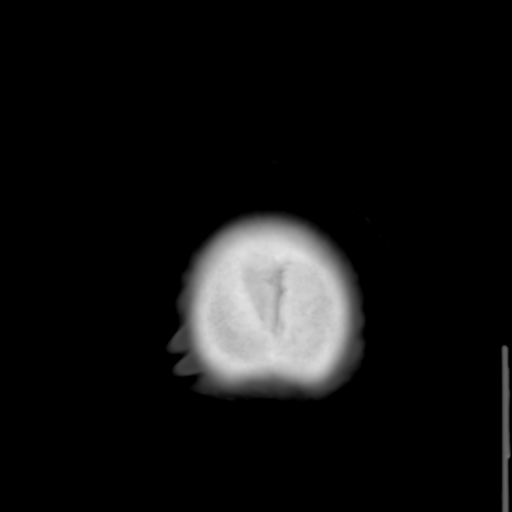

[Series 5: head 3.0 mpr cor · coronal · 0.31mm/px · 3 of 62 slices shown]
[im 21/62  brain]
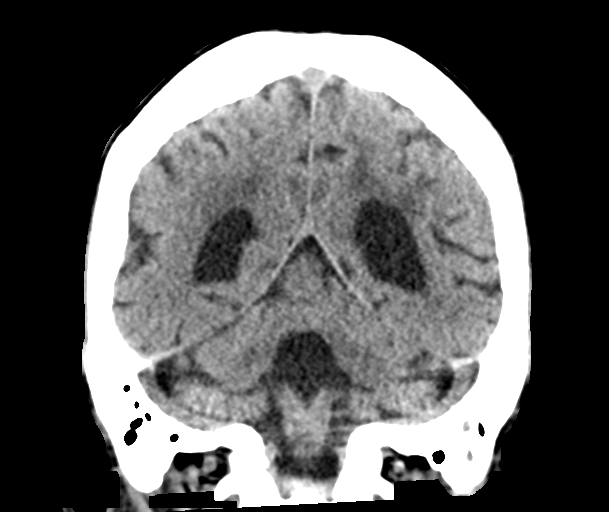
[im 28/62  brain]
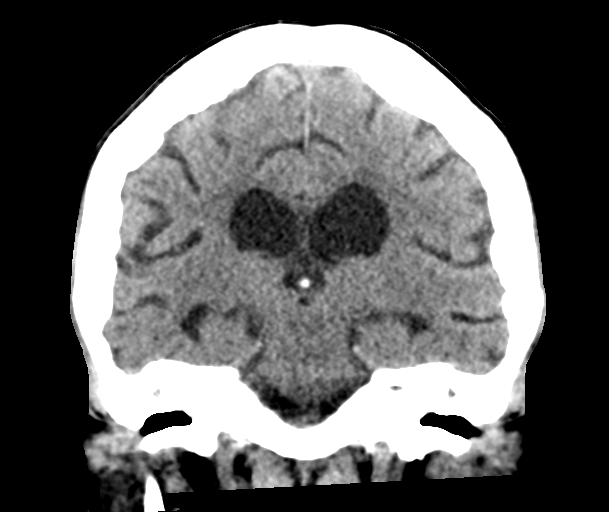
[im 34/62  brain]
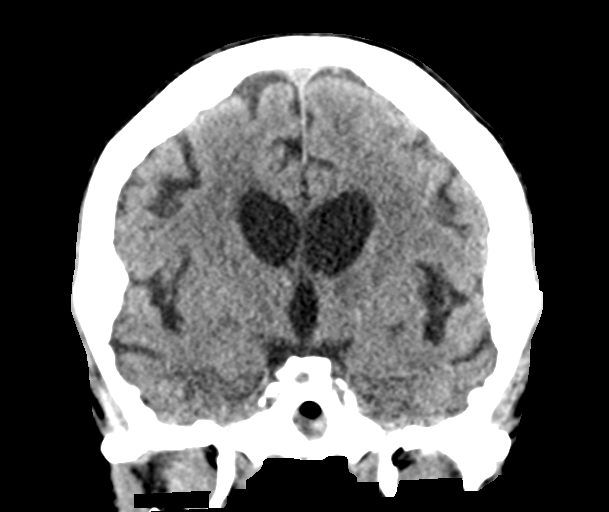

[Series 6: head 3.0 mpr sag · sagittal · 0.31mm/px · 3 of 53 slices shown]
[im 18/53  brain]
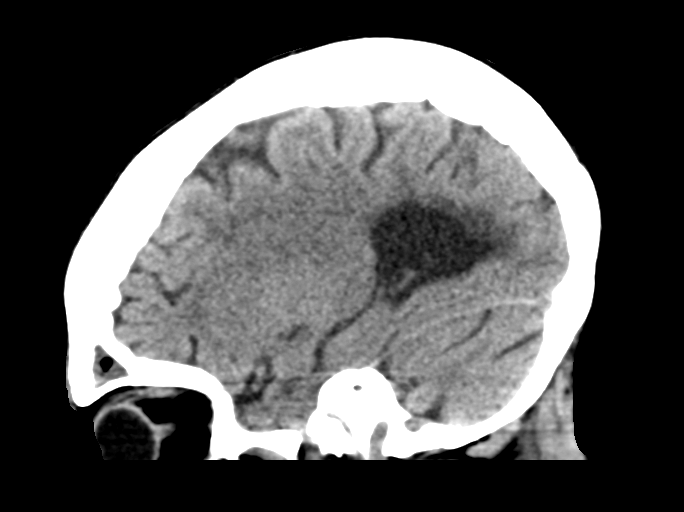
[im 27/53  brain]
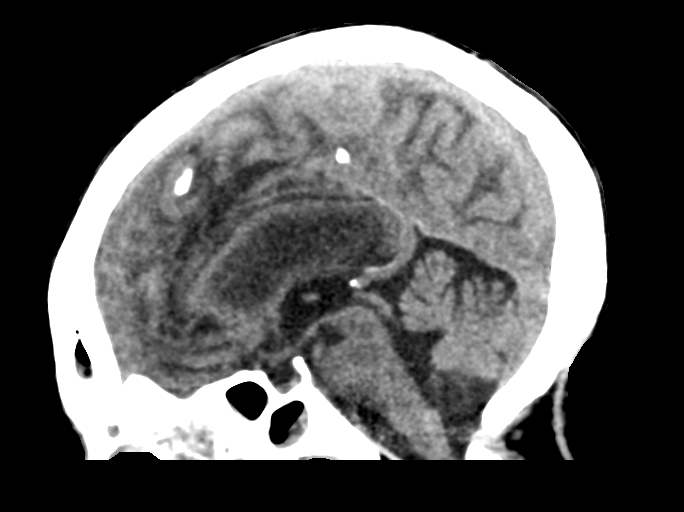
[im 35/53  brain]
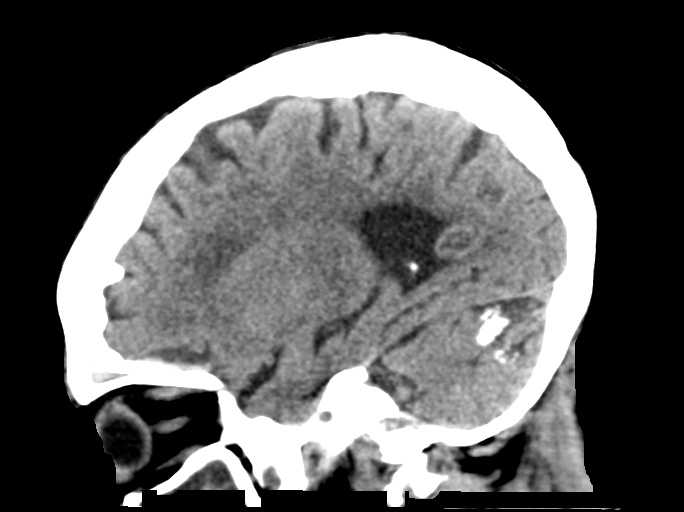

[15 of 47 positions shown; findings below may reference images not displayed]

FINDINGS: Brain: Questionable new hypoattenuating area in the right
thalamus/basal ganglia on image [DATE]. Areas of encephalomalacia and
calcifications in the bilateral cerebellum, larger on the right,
previously evaluated on MRI [DATE] and consistent with prior
infarctions. Moderate burden chronic ischemic white matter disease.
Lacunar type infarct in the left thalamus. Global parenchymal volume
loss with ex vacuo dilatation of ventricular system.

Vascular: No hyperdense vessel. Calcified intracranial
atherosclerosis.

Skull: No acute fracture.

Sinuses/Orbits: Near complete opacification of the paranasal
sinuses, similar to prior.

Other: Progressive opacification of bilateral mastoid sinuses with
bilateral middle ear fluid.
IMPRESSION: 1. Questionable hypoattenuating area in the right thalamus/basal
ganglia which may reflect an acute/subacute infarction. Consider
further evaluation with MRI brain.
2. Otherwise similar appearance of chronic findings including prior
infarctions and chronic ischemic white matter disease.
3. Progressive opacification of bilateral mastoid sinuses and
bilateral middle ear fluid.
4. Pansinusitis.

These results were called by telephone at the time of interpretation
on [DATE] at [DATE] to provider Dr. KENCHASHVILI, who verbally
acknowledged these results.

## 2021-06-26 MED ORDER — ACETAMINOPHEN 325 MG PO TABS
650.0000 mg | ORAL_TABLET | Freq: Four times a day (QID) | ORAL | Status: DC | PRN
  Administered 2021-07-14: 650 mg via ORAL
  Filled 2021-06-26: qty 2

## 2021-06-26 MED ORDER — CEFEPIME HCL 1 G IJ SOLR
1.0000 g | INTRAMUSCULAR | Status: DC
Start: 2021-06-27 — End: 2021-06-29
  Administered 2021-06-27 – 2021-06-28 (×2): 1 g via INTRAVENOUS
  Filled 2021-06-26 (×4): qty 1

## 2021-06-26 MED ORDER — SODIUM CHLORIDE 0.9 % IV SOLN
500.0000 mg | INTRAVENOUS | Status: DC
  Administered 2021-06-26 – 2021-06-28 (×3): 500 mg via INTRAVENOUS
  Filled 2021-06-26 (×4): qty 500

## 2021-06-26 MED ORDER — SODIUM CHLORIDE 0.9 % IV BOLUS
500.0000 mL | Freq: Once | INTRAVENOUS | Status: AC
  Administered 2021-06-26: 500 mL via INTRAVENOUS

## 2021-06-26 MED ORDER — ONDANSETRON HCL 4 MG/2ML IJ SOLN: 4.0000 mg | Freq: Four times a day (QID) | INTRAMUSCULAR | Status: DC | PRN

## 2021-06-26 MED ORDER — MORPHINE SULFATE (PF) 2 MG/ML IV SOLN: 2.0000 mg | INTRAVENOUS | Status: DC | PRN

## 2021-06-26 MED ORDER — SODIUM CHLORIDE 0.9 % IV SOLN
2.0000 g | Freq: Once | INTRAVENOUS | Status: AC
  Administered 2021-06-26: 2 g via INTRAVENOUS
  Filled 2021-06-26: qty 2

## 2021-06-26 MED ORDER — VANCOMYCIN HCL IN DEXTROSE 1-5 GM/200ML-% IV SOLN
1000.0000 mg | Freq: Once | INTRAVENOUS | Status: AC
  Administered 2021-06-26: 1000 mg via INTRAVENOUS
  Filled 2021-06-26: qty 200

## 2021-06-26 MED ORDER — ACETAMINOPHEN 650 MG RE SUPP: 650.0000 mg | Freq: Four times a day (QID) | RECTAL | Status: DC | PRN

## 2021-06-26 MED ORDER — ONDANSETRON HCL 4 MG PO TABS: 4.0000 mg | ORAL_TABLET | Freq: Four times a day (QID) | ORAL | Status: DC | PRN

## 2021-06-26 MED ORDER — VANCOMYCIN VARIABLE DOSE PER UNSTABLE RENAL FUNCTION (PHARMACIST DOSING): Status: DC

## 2021-06-26 MED ORDER — MORPHINE SULFATE (PF) 2 MG/ML IV SOLN
1.0000 mg | INTRAVENOUS | Status: DC | PRN
  Administered 2021-06-29: 1 mg via INTRAVENOUS
  Administered 2021-06-30: 2 mg via INTRAVENOUS
  Administered 2021-07-01: 1 mg via INTRAVENOUS
  Administered 2021-07-01 – 2021-07-02 (×5): 2 mg via INTRAVENOUS
  Filled 2021-06-26 (×8): qty 1

## 2021-06-26 NOTE — Progress Notes (Signed)
Acknowledged ABG ordered per MD Alcario Drought. Patient platelets is 42 per lab documentation. Patient is not in the room at this time, therefor could not assess clinical presentation or oxygenation status. Per documentation saturations are acceptable/stable on RA. Awaiting response via secure chat from ordering provider.  Jensen Cheramie L. Tamala Julian, BS, RRT-ACCS, RCP

## 2021-06-26 NOTE — ED Notes (Signed)
I introduced myself to pt. I pulled her up in bed and readjusted her. Pt oriented to self and place. Disoriented to time and situation (was able to say 2020?). Pt asked for a couple warm blankets which were provided. Pt repetitive and difficult to understand (very slow to answer and sometimes doesn't make sense). GCS 14. Pt denies further needs. IV antibx started.

## 2021-06-26 NOTE — ED Notes (Signed)
Pt's husband taking primary set of hearing aids home, leaving secondary set w pt. Additional batteries w pt for secondary set.

## 2021-06-26 NOTE — ED Triage Notes (Signed)
Pt here via [facility] for new-onset weakness, fever. Called by hospice RN, 100.20F w EMS, took home dose of tylenol. "Almost lethargic," hospice pt for lymphocytic leukemia, 2L [place] at baseline, dialysis T, Th, Sa, has not gone since last Thursday. 20G R forearm  122/60 88 HR 99 2L [place]  Does not meet code sepsis per EMS

## 2021-06-26 NOTE — ED Provider Notes (Addendum)
[facility] EMERGENCY DEPARTMENT Provider Note   CSN: [id number] Arrival date & time: 06/26/21  1701   History Chief Complaint  Patient presents with   Fever    Christine Gibson is a 70 y.o. female.  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  Per triage note EMS was called by hospice RN for fever of 100.5.  Additional report that she has not gone to dialysis since last Thursday.  On my exam the patient is minimally interactive with me.  She is able to state her name and that it is [birth year]22.  However she does not respond to other questions.  She is able to follow my commands and track me with multiple commands.  She states that only her bottom is in pain.   Fever     Past Medical History:  Diagnosis Date   Chronic kidney disease    Diabetes mellitus without complication ([place])    History of leukemia    Hypertension     Patient Active Problem List   Diagnosis Date Noted   Sore in nostril 05/31/2021   COVID-19 virus infection 05/13/2021   ESRD needing dialysis ([place]) 05/13/2021   Acute CVA (cerebrovascular accident) ([place]) 05/09/2021   Thrombocytopenia ([place])    Iron deficiency anemia 01/06/2021   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/2021   CKD stage 5 secondary to hypertension ([place]) 07/29/2016   HLD (hyperlipidemia) 07/19/2015   HTN (hypertension) 07/19/2015   DM (diabetes mellitus), type 2 ([place]) 07/19/2015   CLL (chronic lymphocytic leukemia) ([place])    Past Surgical History:  Procedure Laterality Date   ABDOMINAL HYSTERECTOMY  1991   partial   CESAREAN SECTION     x 3    OB History   No obstetric history on file.    Family History  Problem Relation Age of Onset   Hyperlipidemia Mother    Diabetes Mother    Diabetes  Father    Hyperlipidemia Father    Social History   Tobacco Use   Smoking status: Never   Smokeless tobacco: Never  Vaping Use   Vaping Use: Never used  Substance Use Topics   Alcohol use: Yes    Alcohol/week: 0.0 standard drinks    Comment: occassional   Drug use: No   Home Medications Prior to Admission medications   Medication Sig Start Date End Date Taking? Authorizing Provider  acetaminophen (TYLENOL) 325 MG tablet Take 650 mg by mouth every 4 (four) hours as needed for mild pain or fever.    [provider]  albuterol (VENTOLIN HFA) 108 (90 Base) MCG/ACT inhaler Inhale 1 puff into the lungs every 6 (six) hours as needed for wheezing or shortness of breath.    [provider]  atorvastatin (LIPITOR) 40 MG tablet Take 40 mg by mouth daily.    [provider]  benzonatate (TESSALON) 100 MG capsule Take 100 mg by mouth 3 (three) times daily as needed for cough.    [provider]  carvedilol (COREG) 25 MG tablet Take 25 mg by mouth 2 (two) times daily with a meal.    [provider]  guaiFENesin (ROBITUSSIN) 100 MG/5ML SOLN Take 15 mLs by mouth every 4 (four) hours as needed for cough or to loosen phlegm.    [provider]  hydrocortisone cream 1 %  Apply 1 application topically 2 (two) times daily. 12/26/20 12/26/21  [provider]  Hyoscyamine Sulfate SL 0.125 MG SUBL 1 to 2 tablets sublingually every 4 hours as needed for excessive secreations 05/25/21   [provider]  ipratropium-albuterol (DUONEB) 0.5-2.5 (3) MG/3ML SOLN Take 3 mLs by nebulization in the morning, at noon, and at bedtime.    [provider]  lidocaine (XYLOCAINE) 5 % ointment Apply 1 application topically 2 (two) times daily as needed (rectal pain). 06/01/20   [provider]  losartan (COZAAR) 100 MG tablet Take 100 mg by mouth daily.    [provider]  meclizine (ANTIVERT) 25 MG tablet Take 1 tablet (25 mg total) by  mouth 3 (three) times daily as needed for dizziness. 01/12/19   Mosetta Anis, MD  melatonin 3 MG TABS tablet Take 3 mg by mouth at bedtime.    [provider]  morphine (ROXANOL) 20 MG/ML concentrated solution Every 1 hour as needed 05/25/21   [provider]  mupirocin nasal ointment (BACTROBAN NASAL) 2 % Place 1 application into the nose 2 (two) times daily. Apply to sore on nostril twice daily 06/01/21   Michela Pitcher, NP  NIFEdipine (ADALAT CC) 90 MG 24 hr tablet Take 90 mg by mouth daily.    [provider]  ondansetron (ZOFRAN) 4 MG tablet Take 4 mg by mouth every 8 (eight) hours as needed. 04/28/21   [provider]  oxyCODONE (OXY IR/ROXICODONE) 5 MG immediate release tablet Take 5 mg by mouth every 4 (four) hours as needed for pain. 04/28/21   [provider]  sertraline (ZOLOFT) 100 MG tablet Take 100 mg by mouth daily. 02/07/21   [provider]  Zanubrutinib (BRUKINSA) 80 MG CAPS Take 2 capsules by mouth 3 (three) times a week. Tues,Thur. Sat    [provider]   Allergies    Lisinopril  Review of Systems   Review of Systems  Unable to perform ROS: Mental status change  Constitutional:  Positive for fever.  Gastrointestinal:  Positive for rectal pain.   Physical Exam Updated Vital Signs BP (!) 106/55   Pulse 79   Temp 98.5 F (36.9 C) (Oral)   Resp 14   LMP  (LMP Unknown)   SpO2 93%   Physical Exam Vitals and nursing note reviewed.  Constitutional:      General: She is not in acute distress. HENT:     Head: Normocephalic and atraumatic.     Mouth/Throat:     Mouth: Mucous membranes are dry.     Pharynx: Oropharynx is clear.  Eyes:     Extraocular Movements: Extraocular movements intact.     Pupils: Pupils are equal, round, and reactive to light.  Cardiovascular:     Rate and Rhythm: Normal rate and regular rhythm.     Pulses: Normal pulses.     Heart sounds: No murmur heard. Pulmonary:     Effort:  Pulmonary effort is normal.     Breath sounds: Rhonchi present.  Abdominal:     General: Abdomen is flat. Bowel sounds are normal. There is no distension.     Palpations: Abdomen is soft.     Tenderness: There is no abdominal tenderness. There is no guarding.  Musculoskeletal:        General: Normal range of motion.     Cervical back: Normal range of motion and neck supple. No rigidity.  Skin:    General: Skin is warm and  dry.     Capillary Refill: Capillary refill takes less than 2 seconds.  Neurological:     General: No focal deficit present.     Mental Status: She is alert. She is disoriented.     GCS: GCS eye subscore is 4. GCS verbal subscore is 4. GCS motor subscore is 6.     Motor: Weakness present.  Psychiatric:        Speech: Speech is delayed.        Behavior: Behavior is cooperative.   ED Results / Procedures / Treatments   Labs (all labs ordered are listed, but only abnormal results are displayed) Labs Reviewed  RESP PANEL BY RT-PCR (FLU A&B, COVID) ARPGX2 - Abnormal; Notable for the following components:      Result Value   SARS Coronavirus 2 by RT PCR POSITIVE (*)    All other components within normal limits  LACTIC ACID, PLASMA - Abnormal; Notable for the following components:   Lactic Acid, Venous 2.8 (*)    All other components within normal limits  COMPREHENSIVE METABOLIC PANEL - Abnormal; Notable for the following components:   Glucose, Bld 134 (*)    BUN 35 (*)    Creatinine, Ser 6.50 (*)    Calcium 8.3 (*)    Total Protein 4.6 (*)    Albumin 2.4 (*)    GFR, Estimated 6 (*)    All other components within normal limits  CBC WITH DIFFERENTIAL/PLATELET - Abnormal; Notable for the following components:   Hemoglobin 10.6 (*)    HCT 35.5 (*)    MCHC 29.9 (*)    RDW 18.1 (*)    Platelets 42 (*)    nRBC 1.0 (*)    Neutro Abs 0.9 (*)    All other components within normal limits  PROTIME-INR - Abnormal; Notable for the following components:   Prothrombin  Time 15.4 (*)    All other components within normal limits  CULTURE, BLOOD (ROUTINE X 2)  CULTURE, BLOOD (ROUTINE X 2)  APTT  LACTIC ACID, PLASMA  PROCALCITONIN  D-DIMER, QUANTITATIVE  C-REACTIVE PROTEIN  BRAIN NATRIURETIC PEPTIDE  BLOOD GAS, ARTERIAL   EKG None  Radiology DG Chest 1 View  Result Date: 06/26/2021 CLINICAL DATA:  Fever. EXAM: CHEST  1 VIEW COMPARISON:  Chest x-ray 07/09/2021. FINDINGS: Cardiac silhouette is enlarged, unchanged. Small left pleural effusion and patchy opacities in the left lung base persists. There is new focal opacity in medial right mid lung overlying the right hilar region. There may also be some new right hilar enlargement. There is no evidence for pneumothorax. No acute fractures are seen. IMPRESSION: 1. New right midlung infiltrate. Questionable new right hilar enlargement. Findings may be related to pneumonia with right hilar lymphadenopathy. Short-term follow-up two views chest and/or CT recommended to exclude underlying mass at this level. 2. Stable cardiomegaly. 3. Stable small left pleural effusion with left basilar atelectasis/airspace disease. Electronically Signed   By: Ronney Asters M.D.   On: 06/26/2021 18:15    Procedures Procedures   Medications Ordered in ED Medications  vancomycin (VANCOCIN) IVPB 1000 mg/200 mL premix (1,000 mg Intravenous New Bag/Given 06/26/21 1947)  vancomycin variable dose per unstable renal function (pharmacist dosing) (has no administration in time range)  ceFEPIme (MAXIPIME) 1 g in sodium chloride 0.9 % 100 mL IVPB (has no administration in time range)  sodium chloride 0.9 % bolus 500 mL (500 mLs Intravenous New Bag/Given 06/26/21 1859)  ceFEPIme (MAXIPIME) 2 g in sodium chloride 0.9 %  100 mL IVPB (0 g Intravenous Stopped 06/26/21 1937)    ED Course  I have reviewed the triage vital signs and the nursing notes.  Pertinent labs & imaging results that were available during my care of the patient were reviewed by me  and considered in my medical decision making (see chart for details).    MDM Rules/Calculators/A&P Ariane Ditullio is a 70 year old female with past medical history, HPI, PE as described above.   Work-up is consistent with right-sided pneumonia with positive COVID test. Lactic 2.8 cannot give sepsis fluids per protocol due to end-stage renal disease.  Given 500 mL IV bolus. Empirically started vancomycin/cefepime. Hemoglobin 10.6 which is increased from baseline of 8-9.  Platelets 42 similar to previous. Creatinine 6.5, last dialysis Saturday.  No electrolyte derangements.  Next dialysis tomorrow.  I spoke with the husband at length about the plan of care.  He does not have any significant input regarding her current illness.  He states that she is no longer doing IV chemotherapy however has still taking her oral Brukinsa.  He states that she has been fatigued for a long period of time now; however, last week her sons visited from out of town and he states she slept the entire time which is abnormal for her.  He denies sick contacts.  He also endorses that he is unable to take care of her at home by himself.  States that hospice comes intermittently and he has had to call them previously to help with care.  Given new finding of right-sided pneumonia with COVID and immunosuppressed patient will admit to hospitalist service continued care.  Husband is aware of the plan of care Final Clinical Impression(s) / ED Diagnoses Final diagnoses:  COVID-19  Community acquired pneumonia of right middle lobe of lung   Rx / DC Orders ED Discharge Orders     None        Mickie Hillier, PA-C 06/26/21 2041    Mickie Hillier, PA-C 06/26/21 2137    Sherwood Gambler, MD 06/27/21 [phone]

## 2021-06-26 NOTE — ED Notes (Addendum)
Pt to MRI via stretcher.

## 2021-06-26 NOTE — H&P (Signed)
History and Physical    Christine Gibson JHE:[id number] DOB: 27-Jul-1951 DOA: 06/26/2021  PCP: Michela Pitcher, NP  Patient coming from: Home  I have personally briefly reviewed patient's old medical records in [place]  Chief Complaint: AMS, fever  HPI: Christine Gibson is a 70 y.o. 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  Pt hasnt gone to dialysis since Thursday.  Per husband: pt with AMS, lethargy, confusion since Friday or Saturday.  Symptoms are persistent, nothing seems to make better or worse.  Tm 100.5 at home with hospice RN today.  Pt intermittently able to follow commands, intermittently answers yes to everything.  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  Per husband has chronic cough since COVID diagnosis in May of this year which is unchanged.  Pt was also COVID+ in July.   ED Course: No SIRS, BP running 109/55  WBC 4k.  Platelets 42 (chronic).  Lactate 2.8  BUN only 35 despite missed dialysis and K 4.5.  COVID+ again today  Satting 94% on RA  CXR with RML infiltrate, ? PNA  Pt given cefepime + vanc in ED for possible sepsis and 500cc IVF bolus.  CT head concerning for possible acute/subacute RIGHT thalamic infarct   Review of Systems: As per HPI, otherwise all review of systems negative.  Past Medical History:  Diagnosis Date   Chronic kidney disease    Diabetes mellitus without complication ([place])    History of leukemia    Hypertension     Past Surgical History:  Procedure Laterality Date   ABDOMINAL HYSTERECTOMY  1991   partial   CESAREAN SECTION     x 3     reports that she has never smoked. She has never used smokeless tobacco. She reports current alcohol use. She reports that she does not use drugs.  Allergies  Allergen Reactions   Lisinopril Cough    Family History  Problem  Relation Age of Onset   Hyperlipidemia Mother    Diabetes Mother    Diabetes Father    Hyperlipidemia Father      Prior to Admission medications   Medication Sig Start Date End Date Taking? Authorizing Provider  acetaminophen (TYLENOL) 325 MG tablet Take 650 mg by mouth every 4 (four) hours as needed for mild pain or fever.   Yes [provider]  albuterol (VENTOLIN HFA) 108 (90 Base) MCG/ACT inhaler Inhale 1 puff into the lungs every 6 (six) hours as needed for wheezing or shortness of breath.   Yes [provider]  amLODipine (NORVASC) 5 MG tablet Take 5 mg by mouth daily. 03/27/21  Yes [provider]  atorvastatin (LIPITOR) 40 MG tablet Take 40 mg by mouth daily.   Yes [provider]  benzonatate (TESSALON) 100 MG capsule Take 100 mg by mouth 3 (three) times daily.   Yes [provider]  carvedilol (COREG) 25 MG tablet Take 25 mg by mouth 2 (two) times daily with a meal.   Yes [provider]  guaiFENesin (ROBITUSSIN) 100 MG/5ML SOLN Take 15 mLs by mouth every 4 (four) hours as needed for cough or to loosen phlegm.   Yes [provider]  hydrocortisone-pramoxine [facility]) 2.5-1 % rectal cream Place 1 application rectally 3 (three) times daily. 06/23/21  Yes [provider]  Hyoscyamine Sulfate SL 0.125 MG SUBL Place  1-2 tablets under the tongue See admin instructions. 1 to 2 tablets sublingually every 4 hours as needed for excessive secreations 05/25/21  Yes [provider]  lidocaine (XYLOCAINE) 5 % ointment Apply 1 application topically 2 (two) times daily as needed for moderate pain (rectal pain). 06/01/20  Yes [provider]  loratadine (CLARITIN) 10 MG tablet Take 10 mg by mouth daily.   Yes [provider]  LORazepam (ATIVAN) 0.5 MG tablet Take 0.5 mg by mouth every 4 (four) hours as needed for anxiety. 05/25/21  Yes [provider]  losartan (COZAAR) 100 MG tablet Take 100 mg by  mouth daily.   Yes [provider]  melatonin 3 MG TABS tablet Take 3 mg by mouth daily as needed (sleep).   Yes [provider]  morphine (ROXANOL) 20 MG/ML concentrated solution Take 10 mg by mouth every 4 (four) hours as needed for severe pain. Every 1 hour as needed 05/25/21  Yes [provider]  NIFEdipine (ADALAT CC) 60 MG 24 hr tablet Take 60 mg by mouth daily.   Yes [provider]  ondansetron (ZOFRAN) 4 MG tablet Take 4 mg by mouth daily. 04/28/21  Yes [provider]  oxyCODONE (OXY IR/ROXICODONE) 5 MG immediate release tablet Take 5 mg by mouth every 4 (four) hours as needed for pain or severe pain. 04/28/21  Yes [provider]  pantoprazole (PROTONIX) 40 MG tablet Take 40 mg by mouth 2 (two) times daily.   Yes [provider]  sertraline (ZOLOFT) 100 MG tablet Take 100 mg by mouth daily. 02/07/21  Yes [provider]  Zanubrutinib (BRUKINSA) 80 MG CAPS Take 2 capsules by mouth 3 (three) times a week. Tues,Thur. Sat   Yes [provider]  meclizine (ANTIVERT) 25 MG tablet Take 1 tablet (25 mg total) by mouth 3 (three) times daily as needed for dizziness. Patient not taking: Reported on 06/26/2021 01/12/19   Mosetta Anis, MD  mupirocin nasal ointment (BACTROBAN NASAL) 2 % Place 1 application into the nose 2 (two) times daily. Apply to sore on nostril twice daily Patient not taking: No sig reported 06/01/21   Michela Pitcher, NP    Physical Exam: Vitals:   06/26/21 1845 06/26/21 2000 06/26/21 2130 06/26/21 2215  BP: (!) 106/55 (!) 106/52 110/63 (!) 109/55  Pulse: 79 73 74 76  Resp: 14 13 11 14   Temp:      TempSrc:      SpO2: 93% 94% 93% 93%    Constitutional: NAD, calm, comfortable Eyes: PERRL, lids and conjunctivae normal ENMT: Mucous membranes are moist. Posterior pharynx clear of any exudate or lesions.Normal dentition.  Neck: normal, supple, no masses, no thyromegaly Respiratory: Few  rhonchi Cardiovascular: Regular rate and rhythm, no murmurs / rubs / gallops. No extremity edema. 2+ pedal pulses. No carotid bruits.  Abdomen: no tenderness, no masses palpated. No hepatosplenomegaly. Bowel sounds positive.  Musculoskeletal: no clubbing / cyanosis. No joint deformity upper and lower extremities. Good ROM, no contractures. Normal muscle tone.  Skin: no rashes, lesions, ulcers. No induration Neurologic: MAE, seems to have aphasia at times, speech delayed Psychiatric: Not oriented   Labs on Admission: I have personally reviewed following labs and imaging studies  CBC: Recent Labs  Lab 06/26/21 1800  WBC 4.0  NEUTROABS 0.9*  HGB 10.6*  HCT 35.5*  MCV 91.3  PLT 42*   Basic Metabolic Panel: Recent Labs  Lab 06/26/21 1800  NA 142  K 4.5  CL 103  CO2 25  GLUCOSE 134*  BUN 35*  CREATININE 6.50*  CALCIUM 8.3*   GFR: CrCl cannot be calculated (Unknown ideal weight.). Liver Function Tests: Recent Labs  Lab 06/26/21 1800  AST 26  ALT 12  ALKPHOS 53  BILITOT 0.5  PROT 4.6*  ALBUMIN 2.4*   No results for input(s): LIPASE, AMYLASE in the last 168 hours. No results for input(s): AMMONIA in the last 168 hours. Coagulation Profile: Recent Labs  Lab 06/26/21 1800  INR 1.2   Cardiac Enzymes: No results for input(s): CKTOTAL, CKMB, CKMBINDEX, TROPONINI in the last 168 hours. BNP (last 3 results) No results for input(s): PROBNP in the last 8760 hours. HbA1C: No results for input(s): HGBA1C in the last 72 hours. CBG: No results for input(s): GLUCAP in the last 168 hours. Lipid Profile: No results for input(s): CHOL, HDL, LDLCALC, TRIG, CHOLHDL, LDLDIRECT in the last 72 hours. Thyroid Function Tests: No results for input(s): TSH, T4TOTAL, FREET4, T3FREE, THYROIDAB in the last 72 hours. Anemia Panel: No results for input(s): VITAMINB12, FOLATE, FERRITIN, TIBC, IRON, RETICCTPCT in the last 72 hours. Urine analysis:    Component Value Date/Time    COLORURINE STRAW (A) 01/12/2019 1130   APPEARANCEUR CLEAR 01/12/2019 1130   LABSPEC 1.007 01/12/2019 1130   PHURINE 7.0 01/12/2019 1130   GLUCOSEU 150 (A) 01/12/2019 1130   HGBUR NEGATIVE 01/12/2019 1130   BILIRUBINUR NEGATIVE 01/12/2019 1130   KETONESUR NEGATIVE 01/12/2019 1130   PROTEINUR >=300 (A) 01/12/2019 1130   NITRITE NEGATIVE 01/12/2019 1130   LEUKOCYTESUR NEGATIVE 01/12/2019 1130    Radiological Exams on Admission: DG Chest 1 View  Result Date: 06/26/2021 CLINICAL DATA:  Fever. EXAM: CHEST  1 VIEW COMPARISON:  Chest x-ray 07/09/2021. FINDINGS: Cardiac silhouette is enlarged, unchanged. Small left pleural effusion and patchy opacities in the left lung base persists. There is new focal opacity in medial right mid lung overlying the right hilar region. There may also be some new right hilar enlargement. There is no evidence for pneumothorax. No acute fractures are seen. IMPRESSION: 1. New right midlung infiltrate. Questionable new right hilar enlargement. Findings may be related to pneumonia with right hilar lymphadenopathy. Short-term follow-up two views chest and/or CT recommended to exclude underlying mass at this level. 2. Stable cardiomegaly. 3. Stable small left pleural effusion with left basilar atelectasis/airspace disease. Electronically Signed   By: Ronney Asters M.D.   On: 06/26/2021 18:15   CT HEAD WO CONTRAST (5MM)  Result Date: 06/26/2021 CLINICAL DATA:  Altered mental status. EXAM: CT HEAD WITHOUT CONTRAST TECHNIQUE: Contiguous axial images were obtained from the base of the skull through the vertex without intravenous contrast. COMPARISON:  May 09, 2021 FINDINGS: Brain: Questionable new hypoattenuating area in the right thalamus/basal ganglia on image 15/4. Areas of encephalomalacia and calcifications in the bilateral cerebellum, larger on the right, previously evaluated on MRI May 09, 2021 and consistent with prior infarctions. Moderate burden chronic ischemic white matter  disease. Lacunar type infarct in the left thalamus. Global parenchymal volume loss with ex vacuo dilatation of ventricular system. Vascular: No hyperdense vessel. Calcified intracranial atherosclerosis. Skull: No acute fracture. Sinuses/Orbits: Near complete opacification of the paranasal sinuses, similar to prior. Other: Progressive opacification of bilateral mastoid sinuses with bilateral middle ear fluid. IMPRESSION: 1. Questionable hypoattenuating area in the right thalamus/basal ganglia which may reflect an acute/subacute infarction. Consider further evaluation with MRI brain. 2. Otherwise similar appearance of chronic findings including prior infarctions and chronic ischemic white matter disease. 3.  Progressive opacification of bilateral mastoid sinuses and bilateral middle ear fluid. 4. Pansinusitis. These results were called by telephone at the time of interpretation on 06/26/2021 at 9:33 pm to provider Dr. Verner Chol, who verbally acknowledged these results. Electronically Signed   By: Dahlia Bailiff M.D.   On: 06/26/2021 21:35    EKG: Independently reviewed.  Assessment/Plan Principal Problem:   Acute encephalopathy Active Problems:   CLL (chronic lymphocytic leukemia) (HCC)   HTN (hypertension)   DM (diabetes mellitus), type 2 (HCC)   Anal warts   Acute CVA (cerebrovascular accident) ([place])   Thrombocytopenia ([place])   COVID-19 virus infection   ESRD needing dialysis ([place])   Right middle lobe pneumonia    Acute encephalopathy - DDx includes Delirium due to PNA Acute/subacute stroke Combination of above Other Pending tests at this time: MRI brain Ammonia D.Dimer CRP Procalcitonin May wish to get CT chest as recd by CXR today to further evaluate the RML opacity.  Will hold off on the moment though since numerous other tests still pending at this time. SLP eval + treat, dont think shes safe to take POs at the moment ? RML PNA - Empiric cefepime + vanc given in ED Will switch to  cefepime + azithro for the moment Repeat labs in AM ? Acute/subacute ischemic stroke - Spoke with Dr. Rory Percy Recd MRI Formal consult if MRI positive for stroke Not candidate for antiplatelets due to thrombocytopenia. COVID positive - Not clear that this is resulting in PNA Pt has been COVID positive on tests since May of this year per husband Was positive back in July on testing here Holding off on remdesivir for the moment Thrombocytopenia - Chronic and stable Presumably related to CLL CLL - Not on chemo per husband at this time Though still takes [place] On hospice, but per husband goals of care right now = do everything up to and including putting her on life support if needed. May want to get [place] care involved in AM, though id prefer to get more information about cause of AMS (test results) before contacting [place] care tonight. ESRD - Despite reported missed dialysis, BUN only 35, K only 4.5, no emergent indications for dialysis tonight. Call nephro in AM HTN - Having to hold home PO meds due to NPO status Add PRN short acting if needed DM - Looks like this just diet controlled, on no home meds for this. Pain control - Using PRN morphine IV due to NPO status  DVT prophylaxis: SCDs Code Status: Full code per husband Family Communication: Husband at bedside Disposition Plan: TBD Consults called: None formally, Curbsided Dr. Rory Percy as above Admission status: Admit to inpatient  Severity of Illness: The appropriate patient status for this patient is INPATIENT. Inpatient status is judged to be reasonable and necessary in order to provide the required intensity of service to ensure the patient's safety. The patient's presenting symptoms, physical exam findings, and initial radiographic and laboratory data in the context of their chronic comorbidities is felt to place them at high risk for further clinical deterioration. Furthermore, it is not anticipated that the patient will be  medically stable for discharge from the hospital within 2 midnights of admission. The following factors support the patient status of inpatient.   IP status due to acute encephalopathy.  Not currently safe to take POs   * I certify that at the point of admission it is my clinical judgment that the patient will require inpatient hospital care spanning beyond 2 midnights  from the point of admission due to high intensity of service, high risk for further deterioration and high frequency of surveillance required.*   Corbin Falck M. DO [facility]  How to contact the [facility] Attending or Consulting provider [place] or covering provider during after hours [place], for this patient?  Check the care team in [facility] and look for a) attending/consulting [facility] provider listed and b) the [facility] team listed Log into [email or web address]  Amion Physician Scheduling and messaging for groups and whole hospitals  On call and physician scheduling software for group practices, residents, hospitalists and other medical providers for call, clinic, rotation and shift schedules. On[employer] is a hospital-wide system for scheduling doctors and paging doctors on call. EasyPlot is for scientific plotting and data analysis.  [email or web address]  and use [place]'s universal password to access. If you do not have the password, please contact the hospital operator.  Locate the [facility] provider you are looking for under [facility] and page to a number that you can be directly reached. If you still have difficulty reaching the provider, please page the [facility] (Director on Call) for the Hospitalists listed on amion for assistance.  06/26/2021, 10:30 PM

## 2021-06-26 NOTE — Progress Notes (Signed)
Pharmacy Antibiotic Note  Christine Gibson is a 70 y.o. female admitted on 06/26/2021 with sepsis.  Pharmacy has been consulted for cefepime and vancomycin dosing.  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 Patient presenting with weakness and fever. Patient is ESRD on HD TuThSa. Last completed session 9/1.  WBC 4; COVID is positive (Also positive ~1 mo. Ago).  Plan: Cefepime 1g q24h Vancomycin 1000 mg once - subsequent dosing per HD plan F/u cultures De-escalate when appropriate     Temp (24hrs), Avg:98.5 F (36.9 C), Min:98.5 F (36.9 C), Max:98.5 F (36.9 C)  No results for input(s): WBC, CREATININE, LATICACIDVEN, VANCOTROUGH, VANCOPEAK, VANCORANDOM, GENTTROUGH, GENTPEAK, GENTRANDOM, TOBRATROUGH, TOBRAPEAK, TOBRARND, AMIKACINPEAK, AMIKACINTROU, AMIKACIN in the last 168 hours.  CrCl cannot be calculated (Patient's most recent lab result is older than the maximum 21 days allowed.).    Allergies  Allergen Reactions   Lisinopril Cough    Antimicrobials this admission: cefepime 9/5 >>  Vancomycin 9/5 >>   Microbiology results: pending  Thank you for allowing pharmacy to be a part of this patient's care.  Lorelei Pont, PharmD, BCPS 06/26/2021 7:50 PM ED Clinical Pharmacist -  [phone]

## 2021-06-26 NOTE — ED Provider Notes (Signed)
Emergency Medicine Provider Triage Evaluation Note  Christine Gibson , a 70 y.o. female  was evaluated in triage.  Pt complains of called by hospice RN for weakness and fever.  Patient is on 2 L at baseline per EMS report.  She has dialysis Tuesday Thursday Saturday but has not gone since Thursday.      Review of Systems  Positive: Fever at 100.5 Negative: Unable to obtain  Physical Exam  BP (!) 105/51 (BP Location: Right Arm)   Pulse 81   Temp 98.5 F (36.9 C) (Oral)   Resp 16   LMP  (LMP Unknown)   SpO2 100%  Gen:   Patient is somnolent.  She appears her of hearing.  She is able to tell me her name and that her bottom hurts however unable to provide additional meaningful history. Resp:  Normal effort  MSK:   Moves extremities without difficulty  Other:   Warm to touch  Medical Decision Making  Medically screening exam initiated at 5:27 PM.  Appropriate orders placed.  Christine Gibson was informed that the remainder of the evaluation will be completed by another provider, this initial triage assessment does not replace that evaluation, and the importance of remaining in the ED until their evaluation is complete.  Note: Portions of this report may have been transcribed using voice recognition software. Every effort was made to ensure accuracy; however, inadvertent computerized transcription errors may be present    Ollen Gross 06/26/21 2215    Dorie Rank, MD 06/27/21 2127

## 2021-06-27 ENCOUNTER — Encounter (HOSPITAL_COMMUNITY): Payer: Self-pay | Admitting: Internal Medicine

## 2021-06-27 ENCOUNTER — Inpatient Hospital Stay (HOSPITAL_COMMUNITY): Payer: Medicare Other

## 2021-06-27 DIAGNOSIS — I700 Atherosclerosis of aorta: Secondary | ICD-10-CM | POA: Diagnosis not present

## 2021-06-27 DIAGNOSIS — N250 Renal osteodystrophy: Secondary | ICD-10-CM | POA: Diagnosis not present

## 2021-06-27 DIAGNOSIS — I120 Hypertensive chronic kidney disease with stage 5 chronic kidney disease or end stage renal disease: Secondary | ICD-10-CM | POA: Diagnosis not present

## 2021-06-27 DIAGNOSIS — R531 Weakness: Secondary | ICD-10-CM | POA: Diagnosis not present

## 2021-06-27 DIAGNOSIS — N186 End stage renal disease: Secondary | ICD-10-CM | POA: Diagnosis not present

## 2021-06-27 DIAGNOSIS — I313 Pericardial effusion (noninflammatory): Secondary | ICD-10-CM | POA: Diagnosis not present

## 2021-06-27 DIAGNOSIS — D631 Anemia in chronic kidney disease: Secondary | ICD-10-CM | POA: Diagnosis not present

## 2021-06-27 DIAGNOSIS — J189 Pneumonia, unspecified organism: Secondary | ICD-10-CM | POA: Diagnosis not present

## 2021-06-27 DIAGNOSIS — J9811 Atelectasis: Secondary | ICD-10-CM | POA: Diagnosis not present

## 2021-06-27 DIAGNOSIS — J3489 Other specified disorders of nose and nasal sinuses: Secondary | ICD-10-CM | POA: Diagnosis not present

## 2021-06-27 DIAGNOSIS — Z992 Dependence on renal dialysis: Secondary | ICD-10-CM | POA: Diagnosis not present

## 2021-06-27 DIAGNOSIS — I639 Cerebral infarction, unspecified: Secondary | ICD-10-CM | POA: Diagnosis not present

## 2021-06-27 DIAGNOSIS — I619 Nontraumatic intracerebral hemorrhage, unspecified: Secondary | ICD-10-CM | POA: Diagnosis present

## 2021-06-27 DIAGNOSIS — C9110 Chronic lymphocytic leukemia of B-cell type not having achieved remission: Secondary | ICD-10-CM | POA: Diagnosis not present

## 2021-06-27 DIAGNOSIS — R4182 Altered mental status, unspecified: Secondary | ICD-10-CM | POA: Diagnosis not present

## 2021-06-27 LAB — BRAIN NATRIURETIC PEPTIDE: B Natriuretic Peptide: 833.1 pg/mL — ABNORMAL HIGH (ref 0.0–100.0)

## 2021-06-27 LAB — CBC
HCT: 34.8 % — ABNORMAL LOW (ref 36.0–46.0)
HCT: 32.9 % — ABNORMAL LOW (ref 36.0–46.0)
Hemoglobin: 10.6 g/dL — ABNORMAL LOW (ref 12.0–15.0)
Hemoglobin: 9.9 g/dL — ABNORMAL LOW (ref 12.0–15.0)
MCH: 27.3 pg (ref 26.0–34.0)
MCH: 27 pg (ref 26.0–34.0)
MCHC: 30.5 g/dL (ref 30.0–36.0)
MCHC: 30.1 g/dL (ref 30.0–36.0)
MCV: 89.7 fL (ref 80.0–100.0)
MCV: 89.9 fL (ref 80.0–100.0)
Platelets: 47 10*3/uL — ABNORMAL LOW (ref 150–400)
Platelets: 43 10*3/uL — ABNORMAL LOW (ref 150–400)
RBC: 3.88 MIL/uL (ref 3.87–5.11)
RBC: 3.66 MIL/uL — ABNORMAL LOW (ref 3.87–5.11)
RDW: 17.9 % — ABNORMAL HIGH (ref 11.5–15.5)
RDW: 17.8 % — ABNORMAL HIGH (ref 11.5–15.5)
WBC: 4.3 10*3/uL (ref 4.0–10.5)
WBC: 3.8 10*3/uL — ABNORMAL LOW (ref 4.0–10.5)
nRBC: 0.5 % — ABNORMAL HIGH (ref 0.0–0.2)
nRBC: 0.8 % — ABNORMAL HIGH (ref 0.0–0.2)

## 2021-06-27 LAB — COMPREHENSIVE METABOLIC PANEL
ALT: 12 U/L (ref 0–44)
AST: 21 U/L (ref 15–41)
Albumin: 2.4 g/dL — ABNORMAL LOW (ref 3.5–5.0)
Alkaline Phosphatase: 50 U/L (ref 38–126)
Anion gap: 14 (ref 5–15)
BUN: 40 mg/dL — ABNORMAL HIGH (ref 8–23)
CO2: 24 mmol/L (ref 22–32)
Calcium: 8.3 mg/dL — ABNORMAL LOW (ref 8.9–10.3)
Chloride: 105 mmol/L (ref 98–111)
Creatinine, Ser: 7.1 mg/dL — ABNORMAL HIGH (ref 0.44–1.00)
GFR, Estimated: 6 mL/min — ABNORMAL LOW (ref >60)
Glucose, Bld: 91 mg/dL (ref 70–99)
Potassium: 4.6 mmol/L (ref 3.5–5.1)
Sodium: 143 mmol/L (ref 135–145)
Total Bilirubin: 0.7 mg/dL (ref 0.3–1.2)
Total Protein: 4.8 g/dL — ABNORMAL LOW (ref 6.5–8.1)

## 2021-06-27 LAB — GLUCOSE, CAPILLARY
Glucose-Capillary: 64 mg/dL — ABNORMAL LOW (ref 70–99)
Glucose-Capillary: 96 mg/dL (ref 70–99)

## 2021-06-27 IMAGING — CT CT CHEST W/O CM
2 of 4 series · 15 of 36 positions shown, 18 images · non-contrast
Comparison: Chest radiograph [DATE]

CLINICAL DATA: Question right hilar infiltrate on chest radiograph

EXAM:
CT CHEST WITHOUT CONTRAST
TECHNIQUE: Multidetector CT imaging of the chest was performed following the
standard protocol without IV contrast.

[Series 4: thorax 2.0 · axial · 0.64mm/px · z∈[+1149,+1387]mm · 12 of 135 slices shown, 15 images]
[im 8/135  mediastinal]
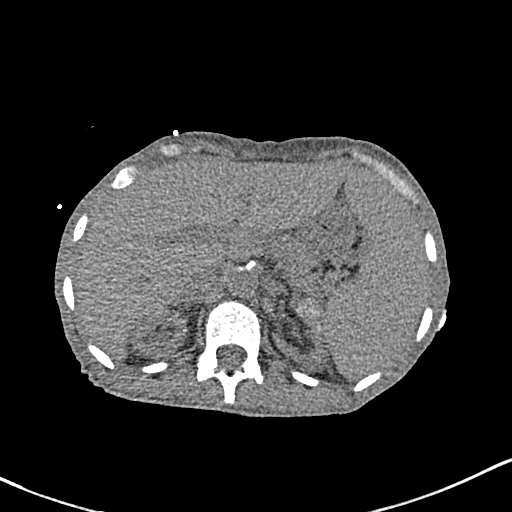
[im 8/135  lung]
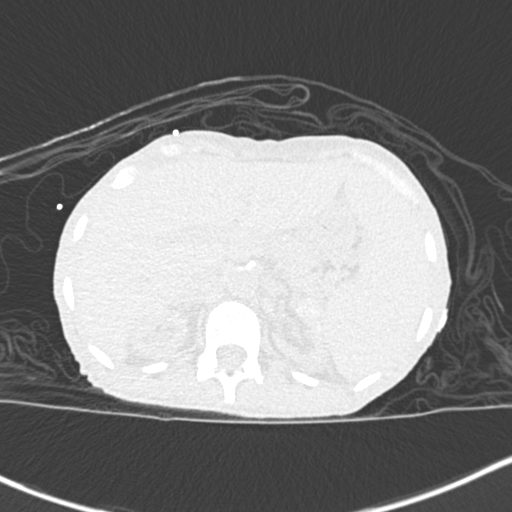
[im 22/135  lung]
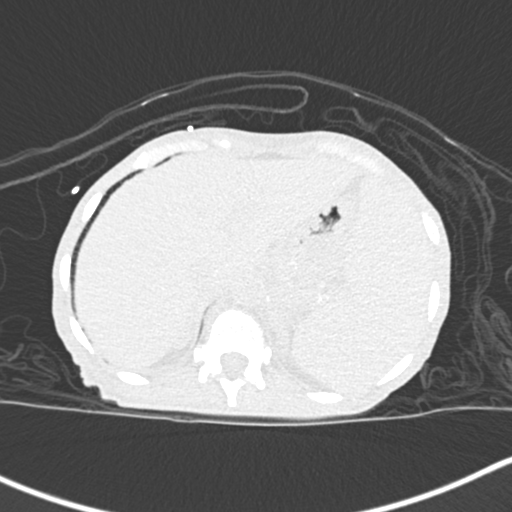
[im 29/135  lung]
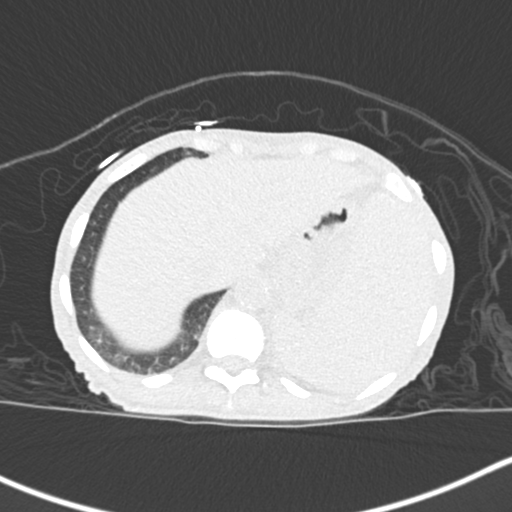
[im 43/135  lung]
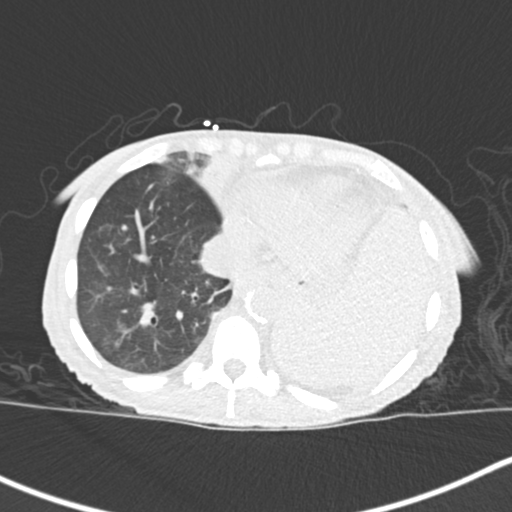
[im 50/135  mediastinal]
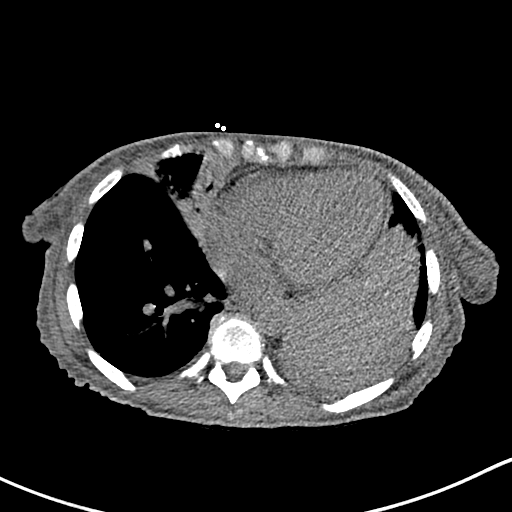
[im 50/135  lung]
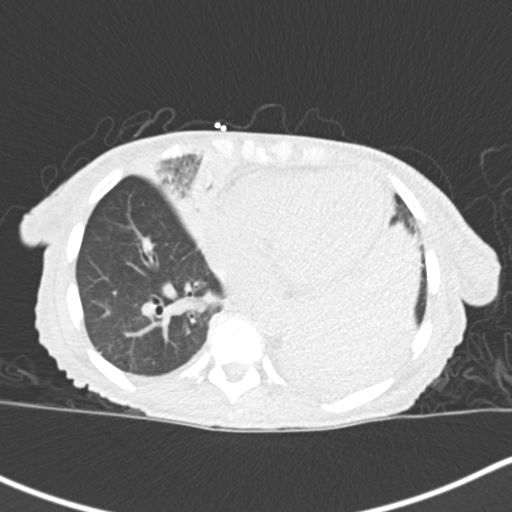
[im 64/135  lung]
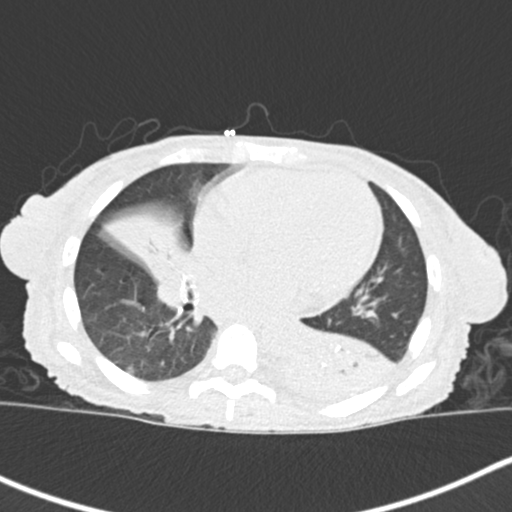
[im 71/135  lung]
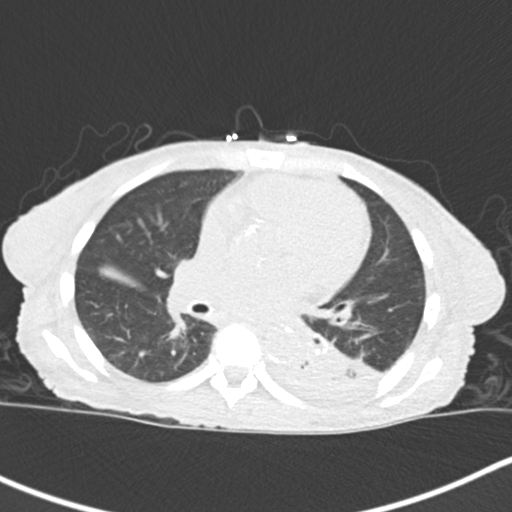
[im 85/135  lung]
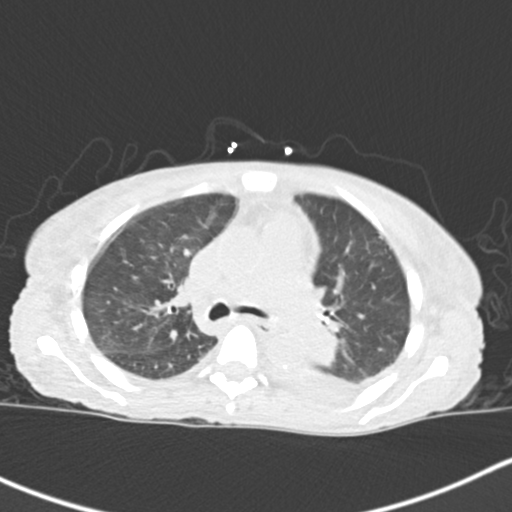
[im 92/135  mediastinal]
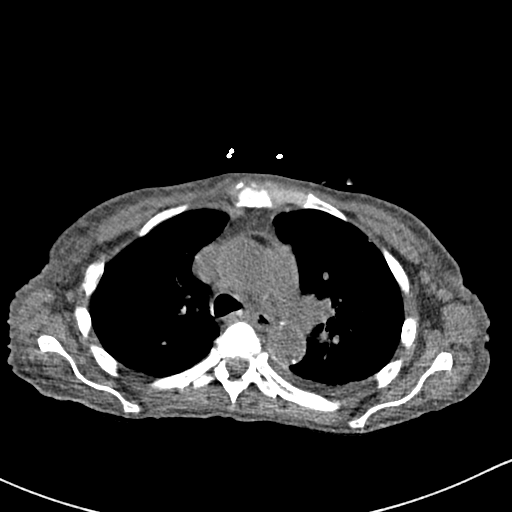
[im 92/135  lung]
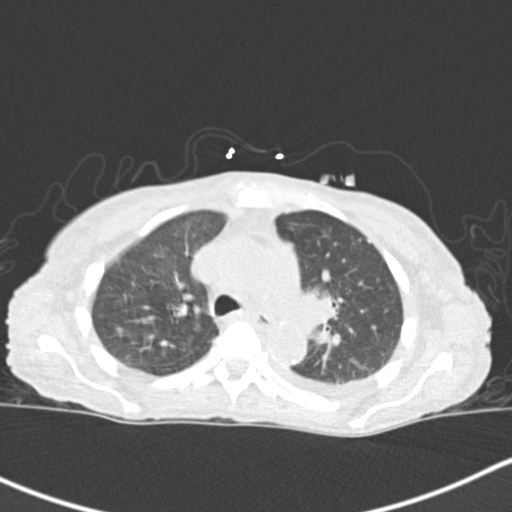
[im 106/135  lung]
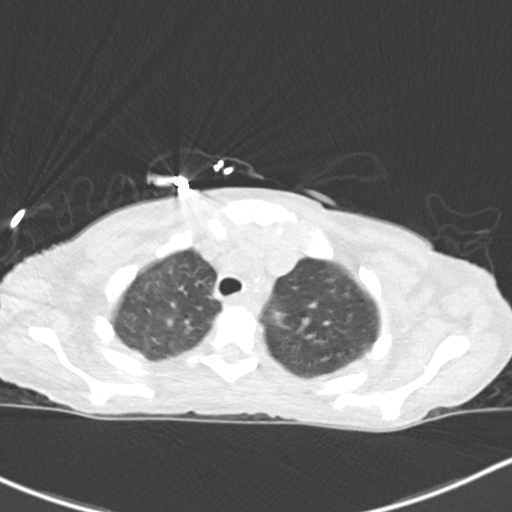
[im 113/135  lung]
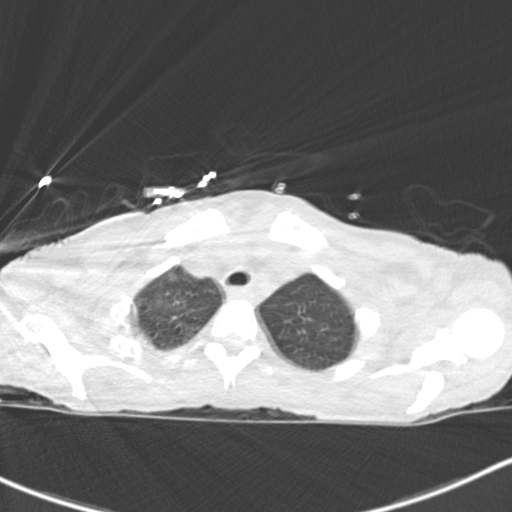
[im 127/135  lung]
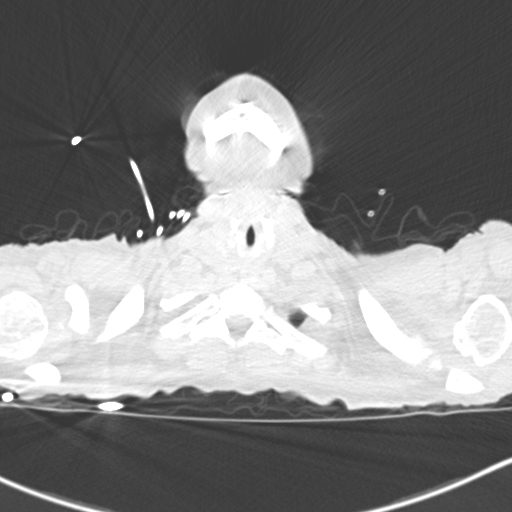

[Series 6: coronal · coronal · 0.55mm/px · 3 of 59 slices shown]
[im 12/59  lung]
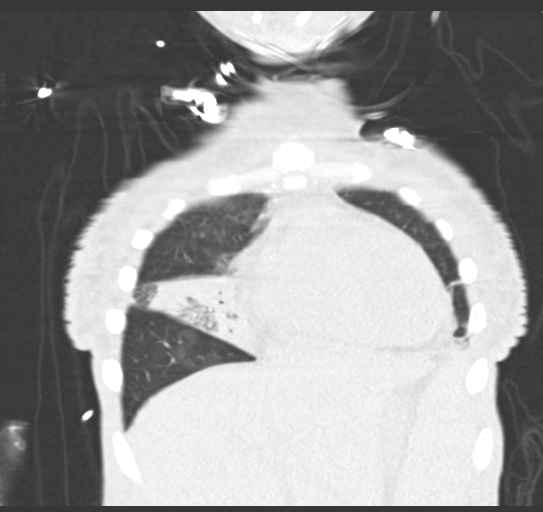
[im 24/59  lung]
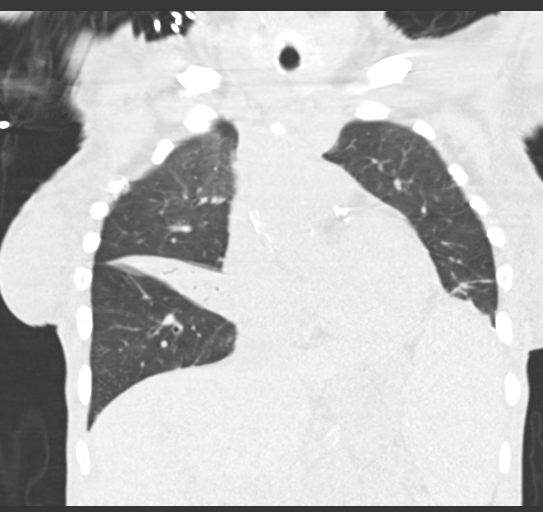
[im 35/59  lung]
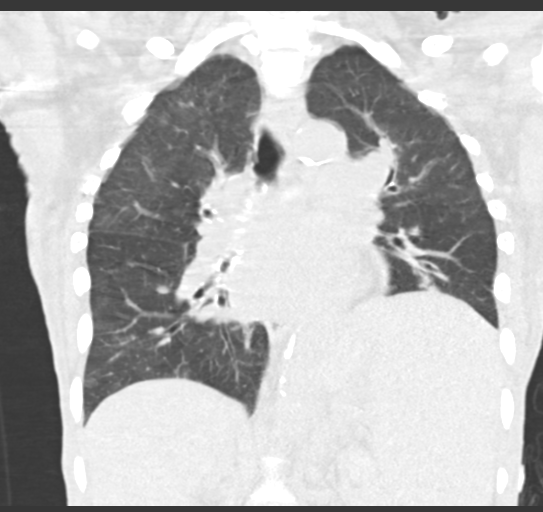

[15 of 36 positions shown; findings below may reference images not displayed]

FINDINGS: Cardiovascular: The heart is at the upper limits of normal for size.
There is trace pericardial fluid. There are aortic valve
calcifications and coronary artery calcifications. There is
calcified atherosclerotic plaque of the thoracic aorta.

Mediastinum/Nodes: The thyroid is unremarkable. The esophagus is not
well assessed. There is no bulky mediastinal lymphadenopathy. There
are a few prominent right axillary lymph nodes measuring up to 7 mm,
nonspecific.

There is fullness in the left suprahilar region, incompletely
characterized in the absence of intravenous contrast but favored to
reflect a vascular structure.

Lungs/Pleura: The trachea is patent.

There is occlusion of the left lower lobe bronchus probably by
mucoid secretions. There is complete postobstructive atelectasis of
the left lower lobe. There is also near complete collapse of the
right lower lobe with patchy opacities in the aerated portion of the
lateral segment. There are patchy ground-glass opacities in the
right apex

As above, there is fullness in the left suprahilar region (3-18).

There is no pleural effusion or pneumothorax.

Upper Abdomen: The kidneys are atrophic. The spleen is enlarged
measuring up to 13.8 cm AP.

Musculoskeletal: There is slight deformity of the left eleventh rib
consistent with prior trauma. There is no acute osseous abnormality.
IMPRESSION: 1. Debris in the left lower lobe bronchus with complete collapse of
the left lower lobe, and near complete collapse of the right middle
lobe likely due to aspiration with mucous plugging.
2. Patchy opacities in the aerated right middle lobe and right apex
also favored to reflect aspiration.
3. Fullness in the left suprahilar region is favored to reflect
vasculature; however, lymphadenopathy can not be excluded in the
absence of intravenous contrast.
4. Recommend follow-up CT chest with intravenous contrast (if not
contraindicated) in 1-3 months to assess for resolution of the
above.
5. Splenomegaly.
6. Trace pericardial effusion.
7. Extensive coronary artery calcifications and aortic
Atherosclerosis ([U5]-[U5]).

## 2021-06-27 MED ORDER — SODIUM CHLORIDE 0.9 % IV SOLN: 100.0000 mL | INTRAVENOUS | Status: DC | PRN

## 2021-06-27 MED ORDER — INSULIN ASPART 100 UNIT/ML IJ SOLN
0.0000 [IU] | Freq: Three times a day (TID) | INTRAMUSCULAR | Status: DC
  Administered 2021-07-05 – 2021-07-12 (×8): 1 [IU] via SUBCUTANEOUS
  Administered 2021-07-12 – 2021-07-13 (×2): 4 [IU] via SUBCUTANEOUS
  Administered 2021-07-14: 2 [IU] via SUBCUTANEOUS
  Administered 2021-07-14: 1 [IU] via SUBCUTANEOUS
  Administered 2021-07-15: 2 [IU] via SUBCUTANEOUS
  Administered 2021-07-15 – 2021-07-17 (×3): 1 [IU] via SUBCUTANEOUS
  Administered 2021-07-18: 2 [IU] via SUBCUTANEOUS
  Administered 2021-07-19: 1 [IU] via SUBCUTANEOUS

## 2021-06-27 MED ORDER — ALTEPLASE 2 MG IJ SOLR: 2.0000 mg | Freq: Once | INTRAMUSCULAR | Status: DC | PRN

## 2021-06-27 MED ORDER — EPINEPHRINE 0.3 MG/0.3ML IJ SOAJ
0.3000 mg | Freq: Once | INTRAMUSCULAR | Status: AC | PRN
  Filled 2021-06-27: qty 0.6

## 2021-06-27 MED ORDER — FAMOTIDINE 20 MG IN NS 100 ML IVPB
20.0000 mg | Freq: Once | INTRAVENOUS | Status: AC | PRN
  Filled 2021-06-27: qty 100

## 2021-06-27 MED ORDER — LIDOCAINE HCL (PF) 1 % IJ SOLN
5.0000 mL | INTRAMUSCULAR | Status: DC | PRN
Start: 2021-06-27 — End: 2021-06-28
  Filled 2021-06-27: qty 5

## 2021-06-27 MED ORDER — CHLORHEXIDINE GLUCONATE CLOTH 2 % EX PADS
6.0000 | MEDICATED_PAD | Freq: Every day | CUTANEOUS | Status: DC
  Administered 2021-06-28 – 2021-07-07 (×8): 6 via TOPICAL

## 2021-06-27 MED ORDER — DIPHENHYDRAMINE HCL 50 MG/ML IJ SOLN: 50.0000 mg | Freq: Once | INTRAMUSCULAR | Status: AC | PRN

## 2021-06-27 MED ORDER — ALBUTEROL SULFATE HFA 108 (90 BASE) MCG/ACT IN AERS
2.0000 | INHALATION_SPRAY | Freq: Once | RESPIRATORY_TRACT | Status: AC | PRN
  Filled 2021-06-27: qty 6.7

## 2021-06-27 MED ORDER — BEBTELOVIMAB 175 MG/2 ML IV (EUA)
175.0000 mg | Freq: Once | INTRAMUSCULAR | Status: AC
  Administered 2021-06-27: 175 mg via INTRAVENOUS
  Filled 2021-06-27: qty 2

## 2021-06-27 MED ORDER — SODIUM CHLORIDE 0.9% IV SOLUTION: Freq: Once | INTRAVENOUS | Status: DC

## 2021-06-27 MED ORDER — HEPARIN SODIUM (PORCINE) 1000 UNIT/ML DIALYSIS
1000.0000 [IU] | INTRAMUSCULAR | Status: DC | PRN
  Filled 2021-06-27: qty 1

## 2021-06-27 MED ORDER — AMLODIPINE BESYLATE 5 MG PO TABS
5.0000 mg | ORAL_TABLET | Freq: Every day | ORAL | Status: DC
  Administered 2021-06-27 – 2021-06-30 (×4): 5 mg via ORAL
  Filled 2021-06-27 (×4): qty 1

## 2021-06-27 MED ORDER — METHYLPREDNISOLONE SODIUM SUCC 125 MG IJ SOLR: 125.0000 mg | Freq: Once | INTRAMUSCULAR | Status: AC | PRN

## 2021-06-27 MED ORDER — SODIUM CHLORIDE 0.9 % IV SOLN: INTRAVENOUS | Status: AC | PRN

## 2021-06-27 MED ORDER — PENTAFLUOROPROP-TETRAFLUOROETH EX AERO
1.0000 "application " | INHALATION_SPRAY | CUTANEOUS | Status: DC | PRN
  Filled 2021-06-27: qty 116

## 2021-06-27 MED ORDER — LIDOCAINE-PRILOCAINE 2.5-2.5 % EX CREA
1.0000 "application " | TOPICAL_CREAM | CUTANEOUS | Status: DC | PRN
  Filled 2021-06-27: qty 5

## 2021-06-27 NOTE — ED Notes (Signed)
Patient's brief changed and bed linens with help of NT. Unable to obtain ordered CBC at this time, consulting lab for draw.

## 2021-06-27 NOTE — ED Notes (Signed)
Will complete morning labs after platelets finish infusing

## 2021-06-27 NOTE — ED Notes (Addendum)
Derek Mound RN, called and spoke to pt's spouse Luverne Farone to get permission to start an infusion of blood platelets.  While he had just woken up he did give consent for transfusion. Lily Lovings RN also verified w/ spouse permission.

## 2021-06-27 NOTE — Significant Event (Signed)
Called by neurorads - MRI shows small acute to subacute R frontal hemorrhage.  Neuro rads thinking more Subacute contusion.  Spoke with neurology, Dr. Rory Percy: Suggested contusion most likely given the location.  Recd platelet transfusion and getting NS to weigh in.  Will call NS.  Spoke with Dr. Glenford Peers: 1) likely going to be non-surgical, they will see in the morning 2) transfuse platelets with goal platelets > 50, starting with 1u, repeat CBC after transfusion.  Patient is already not on any blood thinners, antiplatelets agents, etc secondary to known thrombocytopenia.  INR 1.2 today.  APTT 31.

## 2021-06-27 NOTE — Progress Notes (Signed)
OT Cancellation Note  Patient Details Name: Christine Gibson MRN: [id number] DOB: 12/13/1950   Cancelled Treatment:    Reason Eval/Treat Not Completed: OT screened, no needs identified, will sign off (Pt active with Hospice, OT signing off acutely.)  [name] 06/27/2021, 10:26 AM

## 2021-06-27 NOTE — Consult Note (Signed)
Renal Service Consult Note [facility]  Christine Gibson 06/27/2021 Sol Blazing, MD Requesting Physician: Dr. Sloan Leiter, Chauncey Cruel.   Reason for Consult: ESRD pt w/ fever and AMS HPI: The patient is a 70 y.o. year-old w/ hx of CLL/ thrombocytopenia, HTN, DM2, ESRD on HD. 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 Pt presented yest 9/05 w/ fever and gen'd weakness, lethargy per home hospice RN. On 2L [place] O2 at home. Missed Sat HD, last HD was 9/01. In ED BP 109/55, wBC 4K, plt 42k, lactate 2.8, BUN 35, K 4.5. CXR w/ RML infiltrate, started on IV vanc/ cefepime for suspected PNA. Got 500 cc bolus. COVID + again (has tested + repeatedly since May 2022 admit). Asked to see for ESRD.    Pt seen in ED, curled up on the stretcher. No specific c/o's. Is confused , mild to moderate.   ROS - n/a   Past Medical History  Past Medical History:  Diagnosis Date   Chronic kidney disease    Diabetes mellitus without complication (HCC)    History of leukemia    Hypertension    Past Surgical History  Past Surgical History:  Procedure Laterality Date   ABDOMINAL HYSTERECTOMY  1991   partial   CESAREAN SECTION     x 3   Family History  Family History  Problem Relation Age of Onset   Hyperlipidemia Mother    Diabetes Mother    Diabetes Father    Hyperlipidemia Father    Social History  reports that she has never smoked. She has never used smokeless tobacco. She reports current alcohol use. She reports that she does not use drugs. Allergies  Allergies  Allergen Reactions   Lisinopril Cough   Home medications Prior to Admission medications   Medication Sig Start Date End Date Taking? Authorizing Provider  acetaminophen (TYLENOL) 325 MG tablet Take 650 mg by mouth every 4 (four) hours as needed for mild pain or fever.   Yes [provider]  albuterol (VENTOLIN HFA) 108 (90 Base) MCG/ACT inhaler Inhale 1 puff into the lungs  every 6 (six) hours as needed for wheezing or shortness of breath.   Yes [provider]  amLODipine (NORVASC) 5 MG tablet Take 5 mg by mouth daily. 03/27/21  Yes [provider]  atorvastatin (LIPITOR) 40 MG tablet Take 40 mg by mouth daily.   Yes [provider]  benzonatate (TESSALON) 100 MG capsule Take 100 mg by mouth 3 (three) times daily.   Yes [provider]  carvedilol (COREG) 25 MG tablet Take 25 mg by mouth 2 (two) times daily with a meal.   Yes [provider]  guaiFENesin (ROBITUSSIN) 100 MG/5ML SOLN Take 15 mLs by mouth every 4 (four) hours as needed for cough or to loosen phlegm.   Yes [provider]  hydrocortisone-pramoxine [facility]) 2.5-1 % rectal cream Place 1 application rectally 3 (three) times daily. 06/23/21  Yes [provider]  Hyoscyamine Sulfate SL 0.125 MG SUBL Place 1-2 tablets under the tongue See admin instructions. 1 to 2 tablets sublingually every 4 hours as needed for excessive secreations 05/25/21  Yes [provider]  lidocaine (XYLOCAINE) 5 % ointment Apply 1 application topically 2 (two) times daily as needed for moderate pain (rectal pain). 06/01/20  Yes [provider]  loratadine (CLARITIN) 10 MG tablet Take 10 mg by mouth daily.   Yes [provider]  LORazepam (ATIVAN) 0.5 MG tablet Take 0.5 mg by mouth every 4 (four) hours as needed for anxiety. 05/25/21  Yes [provider]  losartan (COZAAR) 100 MG tablet Take 100 mg by mouth daily.   Yes [provider]  melatonin 3 MG TABS tablet Take 3 mg by mouth daily as needed (sleep).   Yes [provider]  morphine (ROXANOL) 20 MG/ML concentrated solution Take 10 mg by mouth every 4 (four) hours as needed for severe pain. Every 1 hour as needed 05/25/21  Yes [provider]  NIFEdipine (ADALAT CC) 60 MG 24 hr tablet Take 60 mg by mouth daily.   Yes [provider]  ondansetron (ZOFRAN) 4  MG tablet Take 4 mg by mouth daily. 04/28/21  Yes [provider]  oxyCODONE (OXY IR/ROXICODONE) 5 MG immediate release tablet Take 5 mg by mouth every 4 (four) hours as needed for pain or severe pain. 04/28/21  Yes [provider]  pantoprazole (PROTONIX) 40 MG tablet Take 40 mg by mouth 2 (two) times daily.   Yes [provider]  sertraline (ZOLOFT) 100 MG tablet Take 100 mg by mouth daily. 02/07/21  Yes [provider]  Zanubrutinib (BRUKINSA) 80 MG CAPS Take 2 capsules by mouth 3 (three) times a week. Tues,Thur. Sat   Yes [provider]  meclizine (ANTIVERT) 25 MG tablet Take 1 tablet (25 mg total) by mouth 3 (three) times daily as needed for dizziness. Patient not taking: Reported on 06/26/2021 01/12/19   Mosetta Anis, MD  mupirocin nasal ointment (BACTROBAN NASAL) 2 % Place 1 application into the nose 2 (two) times daily. Apply to sore on nostril twice daily Patient not taking: No sig reported 06/01/21   Michela Pitcher, NP     Vitals:   06/27/21 0516 06/27/21 0537 06/27/21 0700 06/27/21 0900  BP: 114/61 123/63 137/62 113/67  Pulse: 87 84 85 93  Resp: 11 11 13 18   Temp: 98.5 F (36.9 C) 98.5 F (36.9 C)    TempSrc: Oral Oral    SpO2:   100% 100%   Exam Gen alert, frail and moderately confused, but pleasant No rash, cyanosis or gangrene Sclera anicteric, throat clear  No jvd or bruits Chest clear bilat to bases, no rales/ wheezing RRR no MRG Abd soft ntnd no mass or ascites +bs GU deferred MS no joint effusions or deformity Ext no LE or UE edema, no wounds or ulcers Neuro is alert, Ox 3 , nf         Home meds include norvasc, lipitor, coreg, ativan prn, losartan, roxanol prn, adalat 60 qd, oxy IR prn, protonix, zoloft, Zanubrutinib tiw po, prn's   CXR - IMPRESSION: 1. New right midlung infiltrate. Questionable new right hilar enlargement. Findings may be related to pneumonia with right hilar lymphadenopathy. Short-term follow-up  two views chest and/or CT recommended to exclude underlying mass at this level.   OP HD: TTS [place]  3.5h  41.5kg  400/500  3K/2.5 bath    L AVG  Hep NONE (due to low plts)    Assessment/ Plan: AMS - possible CVA, vs delirium d/t PNA +/- other PNA - RML, on IV cefepime and azithromax  Recent CVA - in July 2022 COVID + - was + first during May 2022 admit, has been testing + repeatedly since then.  ESRD - on HD TTS.  Missed last HD. Labs okay, plan HD later today, or possibly will be postponed until tomorrow.  CLL/ chronic thrombocytopenia Anemia ckd - get records MBD ckd - get records HTN/ volume - no vol excess on exam or CXR, BP's low-normal, would hold home BP meds for now.       [name]  MD 06/27/2021, 10:43 AM  Recent Labs  Lab 06/26/21 1800 06/27/21 0816  WBC 4.0 4.3  HGB 10.6* 10.6*   Recent Labs  Lab 06/26/21 1800 06/27/21 0816  K 4.5 4.6  BUN 35* 40*  CREATININE 6.50* 7.10*  CALCIUM 8.3* 8.3*

## 2021-06-27 NOTE — Consult Note (Signed)
Reason for Consult:Right frontal hemorrhage Referring Physician: Etta Quill, DO    HPI: Christine Gibson is an 70 y.o. 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  Per triage note EMS was called by hospice RN for fever of 100.5. Additional report that she has not gone to dialysis since last Thursday. History obtained from chart review as patient is unable to provide HPI due to AMS. Imaging of the brain was obtained and revealed a small acute to subacute right frontal hemorrhage. Neurosurgery was asked to consult.   Past Medical History:  Diagnosis Date   Chronic kidney disease    Diabetes mellitus without complication ([place])    History of leukemia    Hypertension     Past Surgical History:  Procedure Laterality Date   ABDOMINAL HYSTERECTOMY  1991   partial   CESAREAN SECTION     x 3    Family History  Problem Relation Age of Onset   Hyperlipidemia Mother    Diabetes Mother    Diabetes Father    Hyperlipidemia Father     Social History:  reports that she has never smoked. She has never used smokeless tobacco. She reports current alcohol use. She reports that she does not use drugs.  Allergies:  Allergies  Allergen Reactions   Lisinopril Cough    Medications: I have reviewed the patient's current medications.  Results for orders placed or performed during the hospital encounter of 06/26/21 (from the past 48 hour(s))  Lactic acid, plasma     Status: Abnormal   Collection Time: 06/26/21  5:26 PM  Result Value Ref Range   Lactic Acid, Venous 2.8 (HH) 0.5 - 1.9 mmol/L    Comment: CRITICAL RESULT CALLED TO, READ BACK BY AND VERIFIED WITH: Isaac Bliss RN BY SSTEPHENS [id number] Performed at [facility] [address], [place], [place] [address]   Blood Culture (routine x 2)      Status: None (Preliminary result)   Collection Time: 06/26/21  5:26 PM   Specimen: BLOOD RIGHT HAND  Result Value Ref Range   Specimen Description BLOOD RIGHT HAND    Special Requests      BOTTLES DRAWN AEROBIC ONLY Blood Culture results may not be optimal due to an inadequate volume of blood received in culture bottles   Culture      NO GROWTH < 12 HOURS Performed at [facility] [address], [place], [place] [address]    Report Status PENDING   Blood Culture (routine x 2)     Status: None (Preliminary result)   Collection Time: 06/26/21  5:31 PM   Specimen: BLOOD  Result Value Ref Range   Specimen Description BLOOD RIGHT ANTECUBITAL    Special Requests      BOTTLES DRAWN AEROBIC AND ANAEROBIC Blood Culture results may not be optimal due to an inadequate volume of blood received in culture bottles   Culture      NO GROWTH < 12 HOURS Performed at [facility] [address], [place], [place] [address]    Report Status PENDING   Comprehensive metabolic panel     Status: Abnormal   Collection Time: 06/26/21  6:00 PM  Result Value Ref Range   Sodium 142 135 - 145 mmol/L  Potassium 4.5 3.5 - 5.1 mmol/L   Chloride 103 98 - 111 mmol/L   CO2 25 22 - 32 mmol/L   Glucose, Bld 134 (H) 70 - 99 mg/dL    Comment: Glucose reference range applies only to samples taken after fasting for at least 8 hours.   BUN 35 (H) 8 - 23 mg/dL   Creatinine, Ser 6.50 (H) 0.44 - 1.00 mg/dL   Calcium 8.3 (L) 8.9 - 10.3 mg/dL   Total Protein 4.6 (L) 6.5 - 8.1 g/dL   Albumin 2.4 (L) 3.5 - 5.0 g/dL   AST 26 15 - 41 U/L   ALT 12 0 - 44 U/L   Alkaline Phosphatase 53 38 - 126 U/L   Total Bilirubin 0.5 0.3 - 1.2 mg/dL   GFR, Estimated 6 (L) >60 mL/min    Comment: (NOTE) Calculated using the CKD-EPI Creatinine Equation (2021)    Anion gap 14 5 - 15    Comment: Performed at [facility], [place] [address], [place], [place] [address]  CBC WITH DIFFERENTIAL     Status: Abnormal    Collection Time: 06/26/21  6:00 PM  Result Value Ref Range   WBC 4.0 4.0 - 10.5 K/uL   RBC 3.89 3.87 - 5.11 MIL/uL   Hemoglobin 10.6 (L) 12.0 - 15.0 g/dL   HCT 35.5 (L) 36.0 - 46.0 %   MCV 91.3 80.0 - 100.0 fL   MCH 27.2 26.0 - 34.0 pg   MCHC 29.9 (L) 30.0 - 36.0 g/dL   RDW 18.1 (H) 11.5 - 15.5 %   Platelets 42 (L) 150 - 400 K/uL    Comment: Immature Platelet Fraction may be clinically indicated, consider ordering this additional test [id number] REPEATED TO VERIFY PLATELET COUNT CONFIRMED BY SMEAR    nRBC 1.0 (H) 0.0 - 0.2 %   Neutrophils Relative % 22 %   Neutro Abs 0.9 (L) 1.7 - 7.7 K/uL   Lymphocytes Relative 60 %   Lymphs Abs 2.4 0.7 - 4.0 K/uL   Monocytes Relative 14 %   Monocytes Absolute 0.6 0.1 - 1.0 K/uL   Eosinophils Relative 2 %   Eosinophils Absolute 0.1 0.0 - 0.5 K/uL   Basophils Relative 1 %   Basophils Absolute 0.0 0.0 - 0.1 K/uL   Immature Granulocytes 1 %   Abs Immature Granulocytes 0.04 0.00 - 0.07 K/uL    Comment: Performed at [facility], [place] [address] [place], [place] [address]  Protime-INR     Status: Abnormal   Collection Time: 06/26/21  6:00 PM  Result Value Ref Range   Prothrombin Time 15.4 (H) 11.4 - 15.2 seconds   INR 1.2 0.8 - 1.2    Comment: (NOTE) INR goal varies based on device and disease states. Performed at [facility], [place] [address] [place], [place] [address]   APTT     Status: None   Collection Time: 06/26/21  6:00 PM  Result Value Ref Range   aPTT 31 24 - 36 seconds    Comment: Performed at [facility] [address] [place], [place] [address]  Resp Panel by RT-PCR (Flu A&B, Covid) Nasopharyngeal Swab     Status: Abnormal   Collection Time: 06/26/21  6:10 PM   Specimen: Nasopharyngeal Swab; Nasopharyngeal(NP) swabs in vial transport medium  Result Value Ref Range   SARS Coronavirus 2 by RT PCR POSITIVE (A) NEGATIVE    Comment: RESULT CALLED TO, READ BACK BY AND VERIFIED WITH:  MEGAN RUGGIEO 06/26/21 @1935    BY JW (NOTE) SARS-CoV-2 target nucleic acids are DETECTED.  The SARS-CoV-2 RNA is generally detectable in upper respiratory specimens during the acute phase of infection. Positive results are indicative of the presence of the identified virus, but do not rule out bacterial infection or co-infection with other pathogens not detected by the test. Clinical correlation with patient history and other diagnostic information is necessary to determine patient infection status. The expected result is Negative.  Fact Sheet for Patients: EntrepreneurPulse.com.au  Fact Sheet for Healthcare Providers: IncredibleEmployment.be  This test is not yet approved or cleared by the [place] FDA and  has been authorized for detection and/or diagnosis of SARS-CoV-2 by FDA under an Emergency Use Authorization (EUA).  This EUA will remain in effect (meaning this test can be  used) for the duration of  the COVID-19 declaration under Section 564(b)(1) of the Act, 21 U.S.C. section 360bbb-3(b)(1), unless the authorization is terminated or revoked sooner.     Influenza A by PCR NEGATIVE NEGATIVE   Influenza B by PCR NEGATIVE NEGATIVE    Comment: (NOTE) The Xpert Xpress SARS-CoV-2/FLU/RSV plus assay is intended as an aid in the diagnosis of influenza from Nasopharyngeal swab specimens and should not be used as a sole basis for treatment. Nasal washings and aspirates are unacceptable for Xpert Xpress SARS-CoV-2/FLU/RSV testing.  Fact Sheet for Patients: EntrepreneurPulse.com.au  Fact Sheet for Healthcare Providers: IncredibleEmployment.be  This test is not yet approved or cleared by the [place] FDA and has been authorized for detection and/or diagnosis of SARS-CoV-2 by FDA under an Emergency Use Authorization (EUA). This EUA will remain in effect (meaning this test can be used) for the duration of the COVID-19 declaration  under Section 564(b)(1) of the Act, 21 U.S.C. section 360bbb-3(b)(1), unless the authorization is terminated or revoked.  Performed at [facility], [place] [address] [place], [place] [address]   Procalcitonin - Baseline     Status: None   Collection Time: 06/26/21  8:00 PM  Result Value Ref Range   Procalcitonin 0.93 ng/mL    Comment:        Interpretation: PCT > 0.5 ng/mL and <= 2 ng/mL: Systemic infection (sepsis) is possible, but other conditions are known to elevate PCT as well. (NOTE)       Sepsis PCT Algorithm           Lower Respiratory Tract                                      Infection PCT Algorithm    ----------------------------     ----------------------------         PCT < 0.25 ng/mL                PCT < 0.10 ng/mL          Strongly encourage             Strongly discourage   discontinuation of antibiotics    initiation of antibiotics    ----------------------------     -----------------------------       PCT 0.25 - 0.50 ng/mL            PCT 0.10 - 0.25 ng/mL               OR       >80% decrease in PCT  Discourage initiation of                                            antibiotics      Encourage discontinuation           of antibiotics    ----------------------------     -----------------------------         PCT >= 0.50 ng/mL              PCT 0.26 - 0.50 ng/mL                AND       <80% decrease in PCT             Encourage initiation of                                             antibiotics       Encourage continuation           of antibiotics    ----------------------------     -----------------------------        PCT >= 0.50 ng/mL                  PCT > 0.50 ng/mL               AND         increase in PCT                  Strongly encourage                                      initiation of antibiotics    Strongly encourage escalation           of antibiotics                                     -----------------------------                                            PCT <= 0.25 ng/mL                                                 OR                                        > 80% decrease in PCT                                      Discontinue / Do not initiate  antibiotics  Performed at [facility], [place] [address] [place], [place] [address]   D-dimer, quantitative     Status: Abnormal   Collection Time: 06/26/21  8:00 PM  Result Value Ref Range   D-Dimer, Quant 0.80 (H) 0.00 - 0.50 ug/mL-FEU    Comment: (NOTE) At the manufacturer cut-off value of 0.5 g/mL FEU, this assay has a negative predictive value of 95-100%.This assay is intended for use in conjunction with a clinical pretest probability (PTP) assessment model to exclude pulmonary embolism (PE) and deep venous thrombosis (DVT) in outpatients suspected of PE or DVT. Results should be correlated with clinical presentation. Performed at [facility], [place] [address], [place], [place] [address]   C-reactive protein     Status: Abnormal   Collection Time: 06/26/21  8:00 PM  Result Value Ref Range   CRP 9.5 (H) <1.0 mg/dL    Comment: Performed at [facility], [place] [address], [place], [place] [address]  Lactic acid, plasma     Status: None   Collection Time: 06/26/21 10:04 PM  Result Value Ref Range   Lactic Acid, Venous 1.1 0.5 - 1.9 mmol/L    Comment: Performed at [facility] [address] [place], [place] [address]  Ammonia     Status: None   Collection Time: 06/26/21 10:04 PM  Result Value Ref Range   Ammonia 34 9 - 35 umol/L    Comment: Performed at [facility], [place] [address], [place], [place] [address]  Prepare Pheresed Platelets     Status: None (Preliminary result)   Collection Time: 06/27/21  2:54 AM  Result Value Ref Range   Unit Number [id number]    Blood Component Type PLTP1 PSORALEN TREATED    Unit division 00    Status of Unit ISSUED    Transfusion Status       OK TO TRANSFUSE Performed at [facility] [address], [place], [place] [address]   Type and screen [place]     Status: None   Collection Time: 06/27/21  3:00 AM  Result Value Ref Range   ABO/RH(D) B POS    Antibody Screen NEG    Sample Expiration      06/30/2021,2359 Performed at [facility], [place] [address], [place], [place] [address]     DG Chest 1 View  Result Date: 06/26/2021 CLINICAL DATA:  Fever. EXAM: CHEST  1 VIEW COMPARISON:  Chest x-ray 07/09/2021. FINDINGS: Cardiac silhouette is enlarged, unchanged. Small left pleural effusion and patchy opacities in the left lung base persists. There is new focal opacity in medial right mid lung overlying the right hilar region. There may also be some new right hilar enlargement. There is no evidence for pneumothorax. No acute fractures are seen. IMPRESSION: 1. New right midlung infiltrate. Questionable new right hilar enlargement. Findings may be related to pneumonia with right hilar lymphadenopathy. Short-term follow-up two views chest and/or CT recommended to exclude underlying mass at this level. 2. Stable cardiomegaly. 3. Stable small left pleural effusion with left basilar atelectasis/airspace disease. Electronically Signed   By: Ronney Asters M.D.   On: 06/26/2021 18:15   CT HEAD WO CONTRAST (5MM)  Result Date: 06/26/2021 CLINICAL DATA:  Altered mental status. EXAM: CT HEAD WITHOUT CONTRAST TECHNIQUE: Contiguous axial images were obtained from the base of the skull through the vertex without intravenous contrast. COMPARISON:  May 09, 2021 FINDINGS: Brain: Questionable new hypoattenuating  area in the right thalamus/basal ganglia on image 15/4. Areas of encephalomalacia and calcifications in the bilateral cerebellum, larger on the right, previously evaluated on MRI May 09, 2021 and consistent with prior infarctions. Moderate burden chronic ischemic white matter disease. Lacunar type infarct in the left  thalamus. Global parenchymal volume loss with ex vacuo dilatation of ventricular system. Vascular: No hyperdense vessel. Calcified intracranial atherosclerosis. Skull: No acute fracture. Sinuses/Orbits: Near complete opacification of the paranasal sinuses, similar to prior. Other: Progressive opacification of bilateral mastoid sinuses with bilateral middle ear fluid. IMPRESSION: 1. Questionable hypoattenuating area in the right thalamus/basal ganglia which may reflect an acute/subacute infarction. Consider further evaluation with MRI brain. 2. Otherwise similar appearance of chronic findings including prior infarctions and chronic ischemic white matter disease. 3. Progressive opacification of bilateral mastoid sinuses and bilateral middle ear fluid. 4. Pansinusitis. These results were called by telephone at the time of interpretation on 06/26/2021 at 9:33 pm to provider Dr. Verner Chol, who verbally acknowledged these results. Electronically Signed   By: Dahlia Bailiff M.D.   On: 06/26/2021 21:35   MR BRAIN WO CONTRAST  Result Date: 06/27/2021 CLINICAL DATA:  Initial evaluation for altered mental status, neuro deficit, stroke suspected. EXAM: MRI HEAD WITHOUT CONTRAST TECHNIQUE: Multiplanar, multiecho pulse sequences of the brain and surrounding structures were obtained without intravenous contrast. COMPARISON:  CT from 06/26/2021 as well as previous exams from 05/10/2021 and 05/09/2021. FINDINGS: Brain: Examination moderately degraded by motion artifact, limiting assessment. Diffuse prominence of the CSF containing spaces compatible with generalized age-related cerebral atrophy. Patchy T2/FLAIR hyperintensity about the periventricular and deep white matter both cerebral hemispheres most consistent with chronic microvascular ischemic disease, moderate in nature, and grossly stable from previous. Superimposed small remote left thalamic lacunar infarct with chronic hemosiderin staining. Scattered remote bilateral  cerebellar infarcts again seen, right greater than left. Associated susceptibility artifact consistent with dystrophic calcification and/or chronic blood products as seen on prior studies, stable. There is a 2 cm focus of FLAIR and T1 hyperintensity involving the anterior/inferior aspect of the right frontal lobe (series 4, image 17), new as compared to prior MRI. In comparison with prior CT, there appears to be a subtle small extra-axial hemorrhage and probable parenchymal contusion at this location (series 5, image 16 on prior CT). This is suspected to be subacute in nature. No significant regional mass effect or edema. No other evidence for acute or subacute infarct. Gray-white matter differentiation otherwise maintained. No other evidence for acute or chronic intracranial hemorrhage. No mass lesion, midline shift or mass effect. No hydrocephalus. No other visible extra-axial fluid collection. Pituitary gland suprasellar region grossly normal. Midline structures grossly intact. Vascular: Major intracranial vascular flow voids are grossly maintained. Skull and upper cervical spine: Craniocervical junction grossly within normal limits. Bone marrow signal intensity diffusely heterogeneous. No visible focal marrow replacing lesion seen on this motion degraded exam. No visible scalp soft tissue abnormality. Sinuses/Orbits: Globes and orbital soft tissues demonstrate no acute finding. Extensive pan sinusitis with large bilateral mastoid effusions noted. Inner ear structures grossly normal. Other: None. IMPRESSION: 1. Motion degraded exam. 2. 2 cm focus of FLAIR and T1 hyperintensity involving the anterior/inferior right frontal lobe, new as compared to recent MRI from 05/09/2021. In comparison with prior CT from 06/26/2021, there appears to be a small extra-axial hemorrhage and probable parenchymal contusion at this location. This is likely subacute in nature without significant regional mass effect or edema.  Correlation with history for possible recent trauma suggested. Appearance would be  atypical and unusual for intracranial spread of infection from adjacent paranasal sinus disease, which would be the primary differential consideration. Short interval follow-up MRI in 2-3 months to ensure these changes resolve is suggested. If there is concern for possible intracranial infection, then correlation with dedicated LP and CSF studies could be performed as warranted. 3. No other acute intracranial abnormality. 4. 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 5. Extensive pan sinusitis with large bilateral mastoid effusions. Results were communicated by telephone at the time of interpretation on 06/27/2021 at 2:32 am to provider JARED GARDNER. Electronically Signed   By: Jeannine Boga M.D.   On: 06/27/2021 02:45    ROS: Per HPI Blood pressure 137/62, pulse 85, temperature 98.5 F (36.9 C), temperature source Oral, resp. rate 13, SpO2 100 %.  Physical Exam: Patient is awake and laying comfortably in bed. She is able to follow intermittent commands. She has poor attention and concentration. Speech is mostly inappropriate. They are in NAD and VSS. MAEW. PERLA, EOMI. CNs grossly intact.     Assessment/Plan: 70 y.o. female with new onset AMS. MRI imaging revealed a contusion at the anterior/inferior right frontal lobe. This is most likely subacute and there is no mass effect or edema. No acute neurosurgical intervention is indicated at this time. Continue supportive care. Call with any questions.   Marvis Moeller, DNP, NP-C 06/27/2021, 7:28 AM

## 2021-06-27 NOTE — ED Notes (Signed)
Pt passed swallow screen. Oriented to person only. [facility] was at [facility] and then she relayed the information back to me. I relayed the swallow screen assessment to Dr. Sloan Leiter. I re-introduced myself to pt. Pt denies pain. Pulled up in bed and sat up. Pt able to follow commands, but still having a hard time answering questions.

## 2021-06-27 NOTE — Progress Notes (Signed)
[facility]  EEG REPORT    NAME: Christine Gibson   MRN: [id number]  DOB: 05/26/1966  GENDER: female AGE: 55yr     SERVICE DATE: 03/22/22  STUDY DURATION: 33 minutes  STUDY NUMBER: [id number]  ORDERING PHYSICIAN: Rasmussen, Ben Gregory, DO  EEG TECHNOLOGIST: R. Bastidas  R. EEG T.  LOCATION: [facility]  EEG SYSTEM: [employer] [facility]     CLINICAL HISTORY:  70yr old female who is having this EEG done for abnormal spell.    Other Data   MEDICATIONS:  Current Outpatient Medications   Medication Sig Dispense Refill    Albuterol (PROAIR HFA, PROVENTIL HFA, VENTOLIN HFA) 90 mcg/actuation inhaler Take 1-2 puffs by inhalation every 4 hours if needed for wheezing. 17 g 1    Allopurinol (ZYLOPRIM) 300 mg Tablet TAKE ONE TABLET BY MOUTH EVERY DAY 90 tablet 1    Aspirin 81 mg Chewable Tablet Take 1 tablet by mouth every day. 30 tablet 0    Atorvastatin (LIPITOR) 40 mg tablet TAKE ONE TABLET BY MOUTH AT BEDTIME 90 tablet 1    Azelaic Acid (FINACEA) 15 % Gel Apply to forehead, cheeks, nose, and chin twice daily 50 g 3    Azelastine Nasal (ASTELIN) 137 mcg (0.1 %) Spray Instill 2 sprays into EACH nostril 2 times daily. 30 mL 6    Chlorthalidone (THALITONE) 25 mg Tablet TAKE ONE TABLET BY MOUTH EVERY DAY 30 tablet 5    Diclofenac (VOLTAREN) 1 % Gel Apply 2 g to the affected area three times daily if needed. 100 g 1    FLOVENT HFA 110 mcg/actuation Inhaler Take 1 puff by inhalation every day. 12 g 1    Fluticasone (FLONASE) 50 mcg/actuation nasal spray Instill 1 spray into EACH nostril every day. Use after performing nasal saline irrigations 16 g 11    Gabapentin (NEURONTIN) 300 mg Capsule TAKE ONE CAPSULE BY MOUTH EVERY MORNING AND TAKE TWO CAPSULES BY MOUTH EVERY EVENING 90 capsule 5    Losartan (COZAAR) 100 mg tablet TAKE ONE TABLET BY MOUTH EVERY DAY 30 tablet 5    Pantoprazole (PROTONIX) 40 mg Delayed Release Tablet TAKE ONE TABLET BY MOUTH EVERY MORNING BEFORE A MEAL 90 tablet 0    Prazosin (MINIPRESS) 5 mg Capsule Take 1 capsule by mouth  every day at bedtime. 90 capsule 1    Trazodone (DESYREL) 50 mg Tablet TAKE ONE TABLET BY MOUTH AT BEDTIME AS NEEDED 30 tablet 5     No current facility-administered medications for this visit.       TECHNICAL DESCRIPTION:  This digital video EEG was performed using the standard 10-20 system of electrode placement and one channel of EKG.           EEG DESCRIPTION:    Clinical State: Awake  Background:  9-10 Hz; posterior head regions; symmetric, waxing and waning, reactive to eye opening and closure  Beta: 18-22 Hz; frontocentral, symmetric, waxing and waning        No stage II/N2 sleep was recorded    ACTIVATION:  Hyperventilation was performed for 3-minutes, producing no abnormal discharges  Photic stimulation was performed at frequencies ranging from 1-60 Hz, producing no abnormal discharges           IMPRESSION: Normal        CLINICAL CORRELATION:  This EEG in the awake state is normal. There are no focal, lateralized or epileptiform abnormalities.   No episodes of concern were captured.      Palak   Parikh, MD

## 2021-06-27 NOTE — ED Notes (Signed)
Pt transported upstairs via stretcher on monitor via RN.

## 2021-06-27 NOTE — Progress Notes (Signed)
PT Cancellation Note  Patient Details Name: Christine Gibson MRN: [id number] DOB: 1951/02/15   Cancelled Treatment:    Reason Eval/Treat Not Completed: PT screened, no needs identified, will sign off Pt from home with hospice services. No acute skilled PT needs. Will sign off. If needs change, please re-consult.   Lou Miner, DPT  Acute Rehabilitation Services  Pager: [phone] Office: [phone]  Rudean Hitt 06/27/2021, 9:32 AM

## 2021-06-27 NOTE — ED Notes (Signed)
Pt appears to be asleep. NAD noted. 

## 2021-06-27 NOTE — ED Notes (Signed)
Pt back from MRI. Hooked back up to cardiac monitor and zithromax. Pulled up in bed and readjusted. Denies further needs.

## 2021-06-27 NOTE — ED Notes (Signed)
Pt to CT

## 2021-06-27 NOTE — ED Notes (Signed)
Report given to Jennifer, RN

## 2021-06-27 NOTE — Progress Notes (Addendum)
PROGRESS NOTE        PATIENT DETAILS Name: Christine Gibson Age: 70 y.o. Sex: female Date of Birth: 08/04/1951 Admit Date: 06/26/2021 Admitting Physician Etta Quill, DO ERD:EYCXK, Alyson Locket, NP  Brief Narrative: Patient is a 70 y.o. female ESRD on HD TTS, CLL, DM-2, HTN, HLD, recent CVA in July-presented to the hospital with confusion and low-grade fever.  Significant events: 9/5>> admit-for evaluation of confusion-MRI brain with acute/subacute right frontal hemorrhage/contusion.  Significant studies: 9/5>> CXR: Right midlung infiltrate. 9/6>> MRI brain: Anterior/inferior right frontal lobe-likely contusion.  Antimicrobial therapy: Vancomycin: 9/5 x 1 Cefepime: 9/5>> Zithromax: 9/5>>  Microbiology data: 5/25>> COVID screening test positive at outside facility (per prior records) 7/19>> COVID PCR: Positive (CT value 24.4) 9/05>> COVID PCR: Positive (CT value 20.4) 9/05>> blood culture: No growth  Procedures : None  Consults: Renal Neurosurgery Neurology (over the phone by admitting MD)  DVT Prophylaxis : SCDs Start: 06/26/21 2226   Subjective: Awake-following some commands-tends to repeat what I say but with persistence-we will able to answer questions appropriately.   Assessment/Plan: Acute metabolic encephalopathy: Seems to be improving-more awake and alert compared to what was described in the H&P.  Etiology probably multifactorial-due to COVID-19 infection/pneumonia/frontal lobe contusion.  Continue supportive care-check EEG.  Acute/subacute right frontal lobe contusion: Neurosurgery following-continue supportive care-avoid antiplatelets for now-spouse acknowledge that patient did fall in hit her head sometime in early August.  Right middle lobe infiltrate: Unclear if this is pneumonia or a enlarged lymph node.  Getting CT chest.  Continue empiric cefepime/Zithromax for now.  If this is pneumonia-radiographically-does not appear to be  consistent with COVID 19 pneumonia.  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  X-ray shows a right middle lobe infiltrate-not consistent with COVID-19 infection.  CT value 20.4-appears to have recurrent COVID-19 infection-immunocompromise from CLL and on Zanubrutinib.  Will order monoclonal antibody.  Check COVID-19 IgG level to see if she has mounted an immune response.  Recent Labs    06/26/21 2000  DDIMER 0.80*  CRP 9.5*   ESRD: On HD TTS-nephrology consulted this morning.  Chronic lymphocytic leukemia with thrombocytopenia: Supportive care for now-hold zanubrutinib (takes 3 times a week)  CVA: Hold antiplatelets due to hemorrhage/contusion seen on MRI brain.  HTN: Resume amlodipine-hold Coreg/losartan-follow BP trend and resume accordingly.  DM-2 (A1c 6.0): Diet controlled at home-start SSI and follow   Diet: Diet Order             Diet NPO time specified  Diet effective now                    Code Status: Full code   Family Communication: Spoke with Spouse [name]-[phone]-over phone on 9/6  Disposition Plan: Status is: Inpatient  Remains inpatient appropriate because:Inpatient level of care appropriate due to severity of illness  Dispo: The patient is from: Home              Anticipated d/c is to: Home              Patient currently is not medically stable to d/c.   Difficult to place patient No    Barriers to Discharge: Encephalopathic-recurrent COVID-19 infection-not yet stable for discharge-see above documentation.  Antimicrobial agents: Anti-infectives (From admission, onward)    Start  Dose/Rate Route Frequency Ordered Stop   06/27/21 2100  ceFEPIme (MAXIPIME) 1 g in sodium chloride 0.9 % 100 mL IVPB        1 g 200 mL/hr over 30 Minutes Intravenous Every 24 hours 06/26/21 1949     06/26/21 2230  azithromycin (ZITHROMAX) 500 mg in sodium chloride 0.9 % 250 mL IVPB         500 mg 250 mL/hr over 60 Minutes Intravenous Every 24 hours 06/26/21 2221 07/01/21 2229   06/26/21 1930  vancomycin (VANCOCIN) IVPB 1000 mg/200 mL premix        1,000 mg 200 mL/hr over 60 Minutes Intravenous  Once 06/26/21 1846 06/26/21 2052   06/26/21 1900  ceFEPIme (MAXIPIME) 2 g in sodium chloride 0.9 % 100 mL IVPB        2 g 200 mL/hr over 30 Minutes Intravenous  Once 06/26/21 1846 06/26/21 1937   06/26/21 1845  vancomycin variable dose per unstable renal function (pharmacist dosing)  Status:  Discontinued         Does not apply See admin instructions 06/26/21 1846 06/26/21 2221        Time spent: 35 minutes-Greater than 50% of this time was spent in counseling, explanation of diagnosis, planning of further management, and coordination of care.  MEDICATIONS: Scheduled Meds:  sodium chloride   Intravenous Once   [START ON 06/28/2021] Chlorhexidine Gluconate Cloth  6 each Topical Q0600   Continuous Infusions:  sodium chloride     sodium chloride     azithromycin Stopped (06/27/21 0130)   ceFEPime (MAXIPIME) IV     PRN Meds:.sodium chloride, sodium chloride, acetaminophen **OR** acetaminophen, alteplase, heparin, lidocaine (PF), lidocaine-prilocaine, morphine injection, ondansetron **OR** ondansetron (ZOFRAN) IV, pentafluoroprop-tetrafluoroeth   PHYSICAL EXAM: Vital signs: Vitals:   06/27/21 0537 06/27/21 0700 06/27/21 0900 06/27/21 1136  BP: 123/63 137/62 113/67 139/62  Pulse: 84 85 93 84  Resp: 11 13 18 15   Temp: 98.5 F (36.9 C)   98.1 F (36.7 C)  TempSrc: Oral   Oral  SpO2:  100% 100% 100%   There were no vitals filed for this visit. There is no height or weight on file to calculate BMI.   Gen Exam:Alert awake-not in any distress HEENT:atraumatic, normocephalic Chest: B/L clear to auscultation anteriorly CVS:S1S2 regular Abdomen:soft non tender, non distended Extremities:no edema Neurology: Non focal Skin: no rash  I have personally reviewed following labs  and imaging studies  LABORATORY DATA: CBC: Recent Labs  Lab 06/26/21 1800 06/27/21 0816 06/27/21 1317  WBC 4.0 4.3 3.8*  NEUTROABS 0.9*  --   --   HGB 10.6* 10.6* 9.9*  HCT 35.5* 34.8* 32.9*  MCV 91.3 89.7 89.9  PLT 42* 47* 43*    Basic Metabolic Panel: Recent Labs  Lab 06/26/21 1800 06/27/21 0816  NA 142 143  K 4.5 4.6  CL 103 105  CO2 25 24  GLUCOSE 134* 91  BUN 35* 40*  CREATININE 6.50* 7.10*  CALCIUM 8.3* 8.3*    GFR: CrCl cannot be calculated (Unknown ideal weight.).  Liver Function Tests: Recent Labs  Lab 06/26/21 1800 06/27/21 0816  AST 26 21  ALT 12 12  ALKPHOS 53 50  BILITOT 0.5 0.7  PROT 4.6* 4.8*  ALBUMIN 2.4* 2.4*   No results for input(s): LIPASE, AMYLASE in the last 168 hours. Recent Labs  Lab 06/26/21 2204  AMMONIA 34    Coagulation Profile: Recent Labs  Lab 06/26/21 1800  INR 1.2    Cardiac  Enzymes: No results for input(s): CKTOTAL, CKMB, CKMBINDEX, TROPONINI in the last 168 hours.  BNP (last 3 results) No results for input(s): PROBNP in the last 8760 hours.  Lipid Profile: No results for input(s): CHOL, HDL, LDLCALC, TRIG, CHOLHDL, LDLDIRECT in the last 72 hours.  Thyroid Function Tests: No results for input(s): TSH, T4TOTAL, FREET4, T3FREE, THYROIDAB in the last 72 hours.  Anemia Panel: No results for input(s): VITAMINB12, FOLATE, FERRITIN, TIBC, IRON, RETICCTPCT in the last 72 hours.  Urine analysis:    Component Value Date/Time   COLORURINE STRAW (A) 01/12/2019 1130   APPEARANCEUR CLEAR 01/12/2019 1130   LABSPEC 1.007 01/12/2019 1130   PHURINE 7.0 01/12/2019 1130   GLUCOSEU 150 (A) 01/12/2019 1130   HGBUR NEGATIVE 01/12/2019 1130   BILIRUBINUR NEGATIVE 01/12/2019 1130   KETONESUR NEGATIVE 01/12/2019 1130   PROTEINUR >=300 (A) 01/12/2019 1130   NITRITE NEGATIVE 01/12/2019 1130   LEUKOCYTESUR NEGATIVE 01/12/2019 1130    Sepsis Labs: Lactic Acid, Venous    Component Value Date/Time   LATICACIDVEN 1.1  06/26/2021 2204    MICROBIOLOGY: Recent Results (from the past 240 hour(s))  Blood Culture (routine x 2)     Status: None (Preliminary result)   Collection Time: 06/26/21  5:26 PM   Specimen: BLOOD RIGHT HAND  Result Value Ref Range Status   Specimen Description BLOOD RIGHT HAND  Final   Special Requests   Final    BOTTLES DRAWN AEROBIC ONLY Blood Culture results may not be optimal due to an inadequate volume of blood received in culture bottles   Culture   Final    NO GROWTH < 12 HOURS Performed at [facility] [address], [place], [place] [address]    Report Status PENDING  Incomplete  Blood Culture (routine x 2)     Status: None (Preliminary result)   Collection Time: 06/26/21  5:31 PM   Specimen: BLOOD  Result Value Ref Range Status   Specimen Description BLOOD RIGHT ANTECUBITAL  Final   Special Requests   Final    BOTTLES DRAWN AEROBIC AND ANAEROBIC Blood Culture results may not be optimal due to an inadequate volume of blood received in culture bottles   Culture   Final    NO GROWTH < 12 HOURS Performed at [facility], [place] [address], [place], [place] [address]    Report Status PENDING  Incomplete  Resp Panel by RT-PCR (Flu A&B, Covid) Nasopharyngeal Swab     Status: Abnormal   Collection Time: 06/26/21  6:10 PM   Specimen: Nasopharyngeal Swab; Nasopharyngeal(NP) swabs in vial transport medium  Result Value Ref Range Status   SARS Coronavirus 2 by RT PCR POSITIVE (A) NEGATIVE Final    Comment: RESULT CALLED TO, READ BACK BY AND VERIFIED WITH: MEGAN RUGGIEO 06/26/21 @1935   BY JW (NOTE) SARS-CoV-2 target nucleic acids are DETECTED.  The SARS-CoV-2 RNA is generally detectable in upper respiratory specimens during the acute phase of infection. Positive results are indicative of the presence of the identified virus, but do not rule out bacterial infection or co-infection with other pathogens not detected by the test. Clinical correlation with patient  history and other diagnostic information is necessary to determine patient infection status. The expected result is Negative.  Fact Sheet for Patients: EntrepreneurPulse.com.au  Fact Sheet for Healthcare Providers: IncredibleEmployment.be  This test is not yet approved or cleared by the [place] FDA and  has been authorized for detection and/or diagnosis of SARS-CoV-2 by FDA under  an Emergency Use Authorization (EUA).  This EUA will remain in effect (meaning this test can be  used) for the duration of  the COVID-19 declaration under Section 564(b)(1) of the Act, 21 U.S.C. section 360bbb-3(b)(1), unless the authorization is terminated or revoked sooner.     Influenza A by PCR NEGATIVE NEGATIVE Final   Influenza B by PCR NEGATIVE NEGATIVE Final    Comment: (NOTE) The Xpert Xpress SARS-CoV-2/FLU/RSV plus assay is intended as an aid in the diagnosis of influenza from Nasopharyngeal swab specimens and should not be used as a sole basis for treatment. Nasal washings and aspirates are unacceptable for Xpert Xpress SARS-CoV-2/FLU/RSV testing.  Fact Sheet for Patients: EntrepreneurPulse.com.au  Fact Sheet for Healthcare Providers: IncredibleEmployment.be  This test is not yet approved or cleared by the [place] FDA and has been authorized for detection and/or diagnosis of SARS-CoV-2 by FDA under an Emergency Use Authorization (EUA). This EUA will remain in effect (meaning this test can be used) for the duration of the COVID-19 declaration under Section 564(b)(1) of the Act, 21 U.S.C. section 360bbb-3(b)(1), unless the authorization is terminated or revoked.  Performed at [facility], [place] [address] [place], [place] [address]     RADIOLOGY STUDIES/RESULTS: DG Chest 1 View  Result Date: 06/26/2021 CLINICAL DATA:  Fever. EXAM: CHEST  1 VIEW COMPARISON:  Chest x-ray 07/09/2021. FINDINGS:  Cardiac silhouette is enlarged, unchanged. Small left pleural effusion and patchy opacities in the left lung base persists. There is new focal opacity in medial right mid lung overlying the right hilar region. There may also be some new right hilar enlargement. There is no evidence for pneumothorax. No acute fractures are seen. IMPRESSION: 1. New right midlung infiltrate. Questionable new right hilar enlargement. Findings may be related to pneumonia with right hilar lymphadenopathy. Short-term follow-up two views chest and/or CT recommended to exclude underlying mass at this level. 2. Stable cardiomegaly. 3. Stable small left pleural effusion with left basilar atelectasis/airspace disease. Electronically Signed   By: Ronney Asters M.D.   On: 06/26/2021 18:15   CT HEAD WO CONTRAST (5MM)  Result Date: 06/26/2021 CLINICAL DATA:  Altered mental status. EXAM: CT HEAD WITHOUT CONTRAST TECHNIQUE: Contiguous axial images were obtained from the base of the skull through the vertex without intravenous contrast. COMPARISON:  May 09, 2021 FINDINGS: Brain: Questionable new hypoattenuating area in the right thalamus/basal ganglia on image 15/4. Areas of encephalomalacia and calcifications in the bilateral cerebellum, larger on the right, previously evaluated on MRI May 09, 2021 and consistent with prior infarctions. Moderate burden chronic ischemic white matter disease. Lacunar type infarct in the left thalamus. Global parenchymal volume loss with ex vacuo dilatation of ventricular system. Vascular: No hyperdense vessel. Calcified intracranial atherosclerosis. Skull: No acute fracture. Sinuses/Orbits: Near complete opacification of the paranasal sinuses, similar to prior. Other: Progressive opacification of bilateral mastoid sinuses with bilateral middle ear fluid. IMPRESSION: 1. Questionable hypoattenuating area in the right thalamus/basal ganglia which may reflect an acute/subacute infarction. Consider further evaluation  with MRI brain. 2. Otherwise similar appearance of chronic findings including prior infarctions and chronic ischemic white matter disease. 3. Progressive opacification of bilateral mastoid sinuses and bilateral middle ear fluid. 4. Pansinusitis. These results were called by telephone at the time of interpretation on 06/26/2021 at 9:33 pm to provider Dr. Verner Chol, who verbally acknowledged these results. Electronically Signed   By: Dahlia Bailiff M.D.   On: 06/26/2021 21:35   MR BRAIN WO CONTRAST  Result Date: 06/27/2021 CLINICAL  DATA:  Initial evaluation for altered mental status, neuro deficit, stroke suspected. EXAM: MRI HEAD WITHOUT CONTRAST TECHNIQUE: Multiplanar, multiecho pulse sequences of the brain and surrounding structures were obtained without intravenous contrast. COMPARISON:  CT from 06/26/2021 as well as previous exams from 05/10/2021 and 05/09/2021. FINDINGS: Brain: Examination moderately degraded by motion artifact, limiting assessment. Diffuse prominence of the CSF containing spaces compatible with generalized age-related cerebral atrophy. Patchy T2/FLAIR hyperintensity about the periventricular and deep white matter both cerebral hemispheres most consistent with chronic microvascular ischemic disease, moderate in nature, and grossly stable from previous. Superimposed small remote left thalamic lacunar infarct with chronic hemosiderin staining. Scattered remote bilateral cerebellar infarcts again seen, right greater than left. Associated susceptibility artifact consistent with dystrophic calcification and/or chronic blood products as seen on prior studies, stable. There is a 2 cm focus of FLAIR and T1 hyperintensity involving the anterior/inferior aspect of the right frontal lobe (series 4, image 17), new as compared to prior MRI. In comparison with prior CT, there appears to be a subtle small extra-axial hemorrhage and probable parenchymal contusion at this location (series 5, image 16 on prior  CT). This is suspected to be subacute in nature. No significant regional mass effect or edema. No other evidence for acute or subacute infarct. Gray-white matter differentiation otherwise maintained. No other evidence for acute or chronic intracranial hemorrhage. No mass lesion, midline shift or mass effect. No hydrocephalus. No other visible extra-axial fluid collection. Pituitary gland suprasellar region grossly normal. Midline structures grossly intact. Vascular: Major intracranial vascular flow voids are grossly maintained. Skull and upper cervical spine: Craniocervical junction grossly within normal limits. Bone marrow signal intensity diffusely heterogeneous. No visible focal marrow replacing lesion seen on this motion degraded exam. No visible scalp soft tissue abnormality. Sinuses/Orbits: Globes and orbital soft tissues demonstrate no acute finding. Extensive pan sinusitis with large bilateral mastoid effusions noted. Inner ear structures grossly normal. Other: None. IMPRESSION: 1. Motion degraded exam. 2. 2 cm focus of FLAIR and T1 hyperintensity involving the anterior/inferior right frontal lobe, new as compared to recent MRI from 05/09/2021. In comparison with prior CT from 06/26/2021, there appears to be a small extra-axial hemorrhage and probable parenchymal contusion at this location. This is likely subacute in nature without significant regional mass effect or edema. Correlation with history for possible recent trauma suggested. Appearance would be atypical and unusual for intracranial spread of infection from adjacent paranasal sinus disease, which would be the primary differential consideration. Short interval follow-up MRI in 2-3 months to ensure these changes resolve is suggested. If there is concern for possible intracranial infection, then correlation with dedicated LP and CSF studies could be performed as warranted. 3. No other acute intracranial abnormality. 4. Underlying chronic microvascular  ischemic disease with multiple remote cerebellar and left thalamic infarcts, stable. 5. Extensive pan sinusitis with large bilateral mastoid effusions. Results were communicated by telephone at the time of interpretation on 06/27/2021 at 2:32 am to provider JARED GARDNER. Electronically Signed   By: Jeannine Boga M.D.   On: 06/27/2021 02:45     LOS: 1 day   Oren Binet, MD  [facility]    To contact the attending provider between 7A-7P or the covering provider during after hours 7P-7A, please log into the web site [email or web address] and access using universal [place] password for that web site. If you do not have the password, please call the hospital operator.  06/27/2021, 1:53 PM

## 2021-06-27 NOTE — ED Notes (Signed)
I went to check on pt again and pt's IV end had come off and pt was bleeding back. I did a full linen change, gown change and IV cap change. Pt pulled up in bed and sat up. Placed on 2 L because when she falls asleep her O2 sats drop to the high 80s. I asked pt if she can tell me when she needs to urinate and she said yes. Given new warm blankets. Denies further needs.

## 2021-06-27 NOTE — ED Notes (Signed)
Pt and I leaving the floor now.

## 2021-06-28 ENCOUNTER — Encounter (HOSPITAL_COMMUNITY): Payer: Self-pay | Admitting: Internal Medicine

## 2021-06-28 ENCOUNTER — Other Ambulatory Visit: Payer: Self-pay

## 2021-06-28 ENCOUNTER — Inpatient Hospital Stay (HOSPITAL_COMMUNITY): Payer: Medicare Other

## 2021-06-28 DIAGNOSIS — C9110 Chronic lymphocytic leukemia of B-cell type not having achieved remission: Secondary | ICD-10-CM | POA: Diagnosis not present

## 2021-06-28 DIAGNOSIS — I120 Hypertensive chronic kidney disease with stage 5 chronic kidney disease or end stage renal disease: Secondary | ICD-10-CM | POA: Diagnosis not present

## 2021-06-28 DIAGNOSIS — R531 Weakness: Secondary | ICD-10-CM | POA: Diagnosis not present

## 2021-06-28 DIAGNOSIS — D631 Anemia in chronic kidney disease: Secondary | ICD-10-CM | POA: Diagnosis not present

## 2021-06-28 DIAGNOSIS — N186 End stage renal disease: Secondary | ICD-10-CM | POA: Diagnosis not present

## 2021-06-28 DIAGNOSIS — J189 Pneumonia, unspecified organism: Secondary | ICD-10-CM | POA: Diagnosis not present

## 2021-06-28 DIAGNOSIS — G9340 Encephalopathy, unspecified: Secondary | ICD-10-CM

## 2021-06-28 DIAGNOSIS — Z992 Dependence on renal dialysis: Secondary | ICD-10-CM | POA: Diagnosis not present

## 2021-06-28 DIAGNOSIS — N250 Renal osteodystrophy: Secondary | ICD-10-CM | POA: Diagnosis not present

## 2021-06-28 DIAGNOSIS — I639 Cerebral infarction, unspecified: Secondary | ICD-10-CM | POA: Diagnosis not present

## 2021-06-28 DIAGNOSIS — R4182 Altered mental status, unspecified: Secondary | ICD-10-CM | POA: Diagnosis not present

## 2021-06-28 LAB — RENAL FUNCTION PANEL
Albumin: 2.3 g/dL — ABNORMAL LOW (ref 3.5–5.0)
Anion gap: 16 — ABNORMAL HIGH (ref 5–15)
BUN: 44 mg/dL — ABNORMAL HIGH (ref 8–23)
CO2: 24 mmol/L (ref 22–32)
Calcium: 8.3 mg/dL — ABNORMAL LOW (ref 8.9–10.3)
Chloride: 103 mmol/L (ref 98–111)
Creatinine, Ser: 7.79 mg/dL — ABNORMAL HIGH (ref 0.44–1.00)
GFR, Estimated: 5 mL/min — ABNORMAL LOW (ref >60)
Glucose, Bld: 83 mg/dL (ref 70–99)
Phosphorus: 8.1 mg/dL — ABNORMAL HIGH (ref 2.5–4.6)
Potassium: 4.7 mmol/L (ref 3.5–5.1)
Sodium: 143 mmol/L (ref 135–145)

## 2021-06-28 LAB — CBC
HCT: 22.9 % — ABNORMAL LOW (ref 36.0–46.0)
Hemoglobin: 7 g/dL — ABNORMAL LOW (ref 12.0–15.0)
MCH: 27.7 pg (ref 26.0–34.0)
MCHC: 30.6 g/dL (ref 30.0–36.0)
MCV: 90.5 fL (ref 80.0–100.0)
Platelets: 28 10*3/uL — CL (ref 150–400)
RBC: 2.53 MIL/uL — ABNORMAL LOW (ref 3.87–5.11)
RDW: 17.3 % — ABNORMAL HIGH (ref 11.5–15.5)
WBC: 2.3 10*3/uL — ABNORMAL LOW (ref 4.0–10.5)
nRBC: 1.3 % — ABNORMAL HIGH (ref 0.0–0.2)

## 2021-06-28 LAB — BLOOD CULTURE ID PANEL (REFLEXED) - BCID2

## 2021-06-28 LAB — BPAM PLATELET PHERESIS
Blood Product Expiration Date: 202209062359
ISSUE DATE / TIME: 202209060500
Unit Type and Rh: 5100

## 2021-06-28 LAB — SAVE SMEAR(SSMR), FOR PROVIDER SLIDE REVIEW

## 2021-06-28 LAB — PREPARE PLATELET PHERESIS: Unit division: 0

## 2021-06-28 LAB — HEMOGLOBIN AND HEMATOCRIT, BLOOD
HCT: 40.4 % (ref 36.0–46.0)
Hemoglobin: 12.7 g/dL (ref 12.0–15.0)

## 2021-06-28 LAB — PREPARE RBC (CROSSMATCH)

## 2021-06-28 LAB — GLUCOSE, CAPILLARY
Glucose-Capillary: 86 mg/dL (ref 70–99)
Glucose-Capillary: 97 mg/dL (ref 70–99)
Glucose-Capillary: 109 mg/dL — ABNORMAL HIGH (ref 70–99)
Glucose-Capillary: 109 mg/dL — ABNORMAL HIGH (ref 70–99)

## 2021-06-28 LAB — C-REACTIVE PROTEIN: CRP: 8.6 mg/dL — ABNORMAL HIGH (ref <1.0)

## 2021-06-28 LAB — SAR COV2 SEROLOGY (COVID19)AB(IGG),IA: SARS-CoV-2 Ab, IgG: NONREACTIVE

## 2021-06-28 MED ORDER — ACETAMINOPHEN 325 MG PO TABS
650.0000 mg | ORAL_TABLET | Freq: Once | ORAL | Status: AC
  Administered 2021-06-28: 650 mg via ORAL
  Filled 2021-06-28: qty 2

## 2021-06-28 MED ORDER — LORAZEPAM 2 MG/ML IJ SOLN: 1.0000 mg | INTRAMUSCULAR | Status: DC | PRN

## 2021-06-28 MED ORDER — DIPHENHYDRAMINE HCL 50 MG/ML IJ SOLN
25.0000 mg | Freq: Once | INTRAMUSCULAR | Status: AC
  Administered 2021-06-28: 25 mg via INTRAVENOUS
  Filled 2021-06-28: qty 1

## 2021-06-28 MED ORDER — DOXERCALCIFEROL 4 MCG/2ML IV SOLN
2.0000 ug | INTRAVENOUS | Status: DC
  Administered 2021-06-29 – 2021-07-20 (×6): 2 ug via INTRAVENOUS
  Filled 2021-06-28 (×14): qty 2

## 2021-06-28 MED ORDER — SODIUM CHLORIDE 0.9% IV SOLUTION: Freq: Once | INTRAVENOUS | Status: AC

## 2021-06-28 NOTE — Progress Notes (Signed)
PHARMACY - PHYSICIAN COMMUNICATION CRITICAL VALUE ALERT - BLOOD CULTURE IDENTIFICATION (BCID)  Christine Gibson is an 70 y.o. female who presented to [facility] on 06/26/2021 with a chief complaint of AMS and fever.  Assessment:   1/3 bottles GPC BCID detected Staphylococcus Epidermidis, MecA detected  Name of physician (or Provider) Contacted: Dr. Sloan Leiter  Current antibiotics:  Cefepime 1g IV q24h Azithromycin 500 mg IV q24h x 5 doses  Changes to prescribed antibiotics recommended:  Staphylococcus Epidermidis likely represents a contaminant.  No additional antibiotics recommended.    Results for orders placed or performed during the hospital encounter of 06/26/21  Blood Culture ID Panel (Reflexed) (Collected: 06/26/2021  5:26 PM)  Result Value Ref Range   Enterococcus faecalis NOT DETECTED NOT DETECTED   Enterococcus Faecium NOT DETECTED NOT DETECTED   Listeria monocytogenes NOT DETECTED NOT DETECTED   Staphylococcus species DETECTED (A) NOT DETECTED   Staphylococcus aureus (BCID) NOT DETECTED NOT DETECTED   Staphylococcus epidermidis DETECTED (A) NOT DETECTED   Staphylococcus lugdunensis NOT DETECTED NOT DETECTED   Streptococcus species NOT DETECTED NOT DETECTED   Streptococcus agalactiae NOT DETECTED NOT DETECTED   Streptococcus pneumoniae NOT DETECTED NOT DETECTED   Streptococcus pyogenes NOT DETECTED NOT DETECTED   A.calcoaceticus-baumannii NOT DETECTED NOT DETECTED   Bacteroides fragilis NOT DETECTED NOT DETECTED   Enterobacterales NOT DETECTED NOT DETECTED   Enterobacter cloacae complex NOT DETECTED NOT DETECTED   Escherichia coli NOT DETECTED NOT DETECTED   Klebsiella aerogenes NOT DETECTED NOT DETECTED   Klebsiella oxytoca NOT DETECTED NOT DETECTED   Klebsiella pneumoniae NOT DETECTED NOT DETECTED   Proteus species NOT DETECTED NOT DETECTED   Salmonella species NOT DETECTED NOT DETECTED   Serratia marcescens NOT DETECTED NOT DETECTED   Haemophilus influenzae NOT  DETECTED NOT DETECTED   Neisseria meningitidis NOT DETECTED NOT DETECTED   Pseudomonas aeruginosa NOT DETECTED NOT DETECTED   Stenotrophomonas maltophilia NOT DETECTED NOT DETECTED   Candida albicans NOT DETECTED NOT DETECTED   Candida auris NOT DETECTED NOT DETECTED   Candida glabrata NOT DETECTED NOT DETECTED   Candida krusei NOT DETECTED NOT DETECTED   Candida parapsilosis NOT DETECTED NOT DETECTED   Candida tropicalis NOT DETECTED NOT DETECTED   Cryptococcus neoformans/gattii NOT DETECTED NOT DETECTED   Methicillin resistance mecA/C DETECTED (A) NOT DETECTED    Lestine Box, PharmD PGY2 Infectious Diseases Pharmacy Resident   Please check [email or web address] for unit-specific pharmacy phone numbers

## 2021-06-28 NOTE — Evaluation (Signed)
Clinical/Bedside Swallow Evaluation Patient Details  Name: Christine Gibson MRN: [id number] Date of Birth: 1951-09-25  Today's Date: 06/28/2021 Time: SLP Start Time (ACUTE ONLY): 1045 SLP Stop Time (ACUTE ONLY): 1110 SLP Time Calculation (min) (ACUTE ONLY): 25 min  Past Medical History:  Past Medical History:  Diagnosis Date   Chronic kidney disease    Diabetes mellitus without complication ([place])    History of leukemia    Hypertension    Past Surgical History:  Past Surgical History:  Procedure Laterality Date   ABDOMINAL HYSTERECTOMY  1991   partial   CESAREAN SECTION     x 3   HPI:  Patient is a 70 y.o. female ESRD on HD TTS, CLL, DM-2, HTN, HLD, recent CVA in July (passed [place] seen by SLP for cognition)-presented to the hospital with confusion and low-grade fever. Pt found to have had a fall, and also COVID. MRI brain: Anterior/inferior right frontal lobe-likely contusion. CT chest shows Debris in the left lower lobe bronchus with complete collapse of the left lower lobe, and near complete collapse of the right middle lobe likely due to aspiration with mucous plugging. Patchy opacities in the aerated right middle lobe and right apex also favored to reflect aspiration. Again passed [facility] this admission.   Assessment / Plan / Recommendation Clinical Impression  Pt demonstrates risk of aspiration primarily from impairmed mentation. Though oral and pharyngeal phase appear subjectively normal under observation, she does have delayed congested coughing after POs. This may or may not be indicative of aspiration. Altered mentation, CT chest findings and inconsistent coughing warrant instrumental assessment. Will f/u with MBS when possible (late pm either today or tomorrow given pts active COVID status). SLP Visit Diagnosis: Dysphagia, unspecified (R13.10)    Aspiration Risk       Diet Recommendation Regular;Thin liquid   Supervision: Full supervision/cueing for compensatory  strategies Compensations: Slow rate;Small sips/bites Postural Changes: Seated upright at 90 degrees    Other  Recommendations     Follow up Recommendations        Frequency and Duration            Prognosis Prognosis for Safe Diet Advancement: Good      Swallow Study   General HPI: Patient is a 70 y.o. female ESRD on HD TTS, CLL, DM-2, HTN, HLD, recent CVA in July (passed [facility] seen by SLP for cognition)-presented to the hospital with confusion and low-grade fever. Pt found to have had a fall, and also COVID. MRI brain: Anterior/inferior right frontal lobe-likely contusion. CT chest shows Debris in the left lower lobe bronchus with complete collapse of the left lower lobe, and near complete collapse of the right middle lobe likely due to aspiration with mucous plugging. Patchy opacities in the aerated right middle lobe and right apex also favored to reflect aspiration. Again passed [facility] this admission. Type of Study: Bedside Swallow Evaluation Diet Prior to this Study: Regular;Thin liquids Temperature Spikes Noted: No Respiratory Status: Room air History of Recent Intubation: No Behavior/Cognition: Confused;Doesn't follow directions;Distractible Oral Cavity Assessment: Within Functional Limits Oral Care Completed by SLP: No Oral Cavity - Dentition: Adequate natural dentition Self-Feeding Abilities: Needs assist;Needs set up Patient Positioning: Postural control interferes with function Baseline Vocal Quality: Low vocal intensity    Oral/Motor/Sensory Function Overall Oral Motor/Sensory Function: Other (comment) (did not follow commands)   Ice Chips Ice chips: Not tested   Thin Liquid Thin Liquid: Impaired Presentation: Straw Pharyngeal  Phase Impairments: Cough - Delayed    Nectar  Thick Nectar Thick Liquid: Not tested   Honey Thick Honey Thick Liquid: Not tested   Puree Puree: Impaired Pharyngeal Phase Impairments: Cough - Delayed   Solid     Solid: Impaired Pharyngeal  Phase Impairments: Cough - Delayed     Herbie Baltimore, MA CCC-SLP  Acute Rehabilitation Services Pager [phone] Office [phone]  Lynann Beaver 06/28/2021,11:21 AM

## 2021-06-28 NOTE — Evaluation (Signed)
Modified Barium Swallow Progress Note  Patient Details  Name: Christine Gibson MRN: [id number] Date of Birth: 08-31-1951  Today's Date: 06/28/2021  Modified Barium Swallow completed.  Full report located under Chart Review in the Imaging Section.  Brief recommendations include the following:  Clinical Impression  Pt demonstrates a mild dysphagia; with liquids there is no impairment, pt does trigger swallow with bolus spilling the pharynx, but airway prtoection is adeqaute. However, when masticating solids pt is distratcted and inattentive. There is a prolonged period after transit to pharynx, up to a minute with verbal cues given, before pt swallows pooled solids. Overall, pts cognition is impaired, impacting swallowing, though this does appear to be improving pt would benefit from softer solids in the meantime to prevent solid food aspiration. Will downgrade to dys 2/thin liquids and f/u for upgrade as pt demonstrate improved attention to meals.   Swallow Evaluation Recommendations       SLP Diet Recommendations: Dysphagia 2 (Fine chop) solids;Thin liquid   Liquid Administration via: Cup;Straw   Medication Administration: Whole meds with liquid   Supervision: Staff to assist with self feeding   Compensations: Minimize environmental distractions;Slow rate;Small sips/bites   Postural Changes: Remain semi-upright after after feeds/meals (Comment)   Oral Care Recommendations: Oral care BID       Herbie Baltimore, MA CCC-SLP  Acute Rehabilitation Services Office [phone]  Christine Gibson 06/28/2021,3:38 PM

## 2021-06-28 NOTE — Plan of Care (Signed)
  Problem: Health Behavior/Discharge Planning: Goal: Ability to manage health-related needs will improve Outcome: Not Progressing   Problem: Coping: Goal: Ability to identify and develop effective coping behavior will improve Outcome: Not Progressing   Problem: Role Relationship: Goal: Ability to verbalize concerns, feelings, and thoughts to partner or family member will improve Outcome: Not Progressing

## 2021-06-28 NOTE — Evaluation (Signed)
Speech Language Pathology Evaluation Patient Details Name: Christine Gibson MRN: [id number] DOB: [birth year] Today's Date: 06/28/2021 Time: 1829-9371 SLP Time Calculation (min) (ACUTE ONLY): 20 min  Problem List:  Patient Active Problem List   Diagnosis Date Noted   ICH (intracerebral hemorrhage) ([place]) 06/27/2021   Acute encephalopathy 06/26/2021   Right middle lobe pneumonia 06/26/2021   Sore in nostril 05/31/2021   COVID-19 virus infection 05/13/2021   ESRD needing dialysis ([place]) 05/13/2021   Acute CVA (cerebrovascular accident) ([place]) 05/09/2021   Thrombocytopenia ([place])    Iron deficiency anemia 01/06/2021   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/2021   CKD stage 5 secondary to hypertension ([place]) 07/29/2016   HLD (hyperlipidemia) 07/19/2015   HTN (hypertension) 07/19/2015   DM (diabetes mellitus), type 2 ([place]) 07/19/2015   CLL (chronic lymphocytic leukemia) ([place])    Past Medical History:  Past Medical History:  Diagnosis Date   Chronic kidney disease    Diabetes mellitus without complication ([place])    History of leukemia    Hypertension    Past Surgical History:  Past Surgical History:  Procedure Laterality Date   ABDOMINAL HYSTERECTOMY  1991   partial   CESAREAN SECTION     x 3   HPI:  Patient is a 70 y.o. female ESRD on HD TTS, CLL, DM-2, HTN, HLD, recent CVA in July (passed [facility] seen by SLP for cognition)-presented to the hospital with confusion and low-grade fever. Pt found to have had a fall, and also COVID. MRI brain: Anterior/inferior right frontal lobe-likely contusion. CT chest shows Debris in the left lower lobe bronchus with complete collapse of the left lower lobe, and near complete collapse of the right middle lobe likely due to aspiration with mucous plugging. Patchy opacities in the aerated right middle lobe and right apex also favored to reflect aspiration. Again passed [facility] this admission.   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 Behaviors on a Rancho scale would be consistent with a Rancho 3/4 (localized/confused and inappopriate). Pt is restless but not agitated; she is unable to focus attention to verbal or functional tasks in better than 50% of opportunities though hearing impairment and N95 mask on therapist may have been complicating factors. Attempted to place hearing aids but they did not work even after batteries replaced. Pt unable to follow one step verbal commands or reply to basic y/N questions. Her verbalizations could be called language of confusion/word salad. When naming pt perseverated on the first object named throughout and also perseverated on the movement of self feeding after hand over hand assist with puree and also giving the middle finger gesture. Pt will need ongoing cognitive linguistic interventions, would recommend SNF care.    SLP Assessment  SLP Recommendation/Assessment: Patient needs continued Speech Lanaguage Pathology Services SLP Visit Diagnosis: Cognitive communication deficit (R41.841)    Follow Up Recommendations       Frequency and Duration min 2x/week  2 weeks      SLP Evaluation Cognition  Overall Cognitive Status: Impaired/Different from baseline Arousal/Alertness: Awake/alert Orientation Level: Disoriented X4 Attention: Focused Focused Attention: Impaired Focused Attention Impairment: Verbal basic;Functional basic Awareness: Impaired Awareness Impairment: Intellectual impairment;Emergent impairment;Anticipatory impairment Problem Solving: Impaired Problem Solving Impairment: Verbal basic;Functional basic Safety/Judgment: Impaired       Comprehension  Auditory Comprehension Overall Auditory Comprehension: Impaired Yes/No Questions: Impaired Basic Biographical Questions: 0-25% accurate Commands: Impaired One Step Basic Commands: 0-24% accurate    Expression Verbal Expression Overall Verbal  Expression: Impaired Initiation: Impaired Automatic Speech:  (unable) Level of Generative/Spontaneous Verbalization: Word Repetition: Impaired Level of Impairment: Word level Naming: Impairment Responsive: Not tested Confrontation: Impaired Convergent: Not tested Verbal Errors: Language of confusion Interfering Components: Attention Written Expression Dominant Hand: Right   Oral / Motor  Oral Motor/Sensory Function Overall Oral Motor/Sensory Function: Other (comment) Motor Speech Overall Motor Speech: Impaired Respiration: Within functional limits Phonation: Low vocal intensity Resonance: Within functional limits Articulation: Impaired Level of Impairment: Word   GO                    Lijah Bourque, Katherene Ponto 06/28/2021, 11:46 AM

## 2021-06-28 NOTE — Evaluation (Signed)
Occupational Therapy Evaluation Patient Details Name: Christine Gibson MRN: [id number] DOB: 07/12/1951 Today's Date: 06/28/2021    History of Present Illness Patient is admitted with acute encephalopathy and pneumonia. Christine Gibson is a 70 y.o. 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 Orginally had COVID in May and has continued to test positive (tested positive in July and now in September).   Clinical Impression   Mrs. Christine Gibson is a 70 year old woman admitted to hospital with above pertinent medical history. Prior to admission patient lived at home with her husband and could ambulate (unsure if she used a walker: she used a walker on her prior hospital admission with therapy) and could perform most of her ADLs. She had assistance with LB dressing and bathing. On evaluation patient exhibits significant confusion/cognitive impairment limiting evaluation. She is able to state her name and her husband's name but otherwise her conversation consists of her perseverating throughout evaluation on the sentence "by yourself" and some other unintelligible murmurings. Patient was max assist to transfer to side of bed and mod assist to stand x 2. Legs buckling with standing and patient returned to supine. Patient intermittently able to follow commands with tactile and visual cues to move lower extremities but did not move her upper extremities on commands. Exhibits some RUE stiffness in shoulder, elbow and fingers from prior stroke limiting ROM and strength. Patient is currently total care at bed level due to impaired cognition. Therapist called patient's spouse for PLOF information. He reports patient has to improve before he can take her home. Therapist recommends short term rehab at discharge.    Follow Up Recommendations  SNF    Equipment Recommendations  None recommended by OT    Recommendations for Other  Services PT consult     Precautions / Restrictions Precautions Precautions: Fall Precaution Comments: HOH Restrictions Weight Bearing Restrictions: No      Mobility Bed Mobility Overal bed mobility: Needs Assistance Bed Mobility: Supine to Sit;Sit to Supine     Supine to sit: Max assist Sit to supine: Total assist   General bed mobility comments: Patient required tactile cues to perform movement of supine to assist and patinet attempted to assist with pushing up in to sitting. Total assist to return to supine.    Transfers Overall transfer level: Needs assistance Equipment used: None Transfers: Sit to/from Stand Sit to Stand: Mod assist         General transfer comment: Mod assist to stand. With tactile and verbal cues to take step toward head of bed patient able to take one step and then legs buckled and patient returned to seated posistion. Attempted stand again to get patient further up in bed - once again patient's legs buckled.    Balance Overall balance assessment: Needs assistance Sitting-balance support: No upper extremity supported Sitting balance-Leahy Scale: Poor     Standing balance support: During functional activity Standing balance-Leahy Scale: Poor                             ADL either performed or assessed with clinical judgement   ADL Overall ADL's : Needs assistance/impaired Eating/Feeding: Total assistance;Bed level   Grooming: Total assistance;Bed level   Upper Body Bathing: Total assistance;Bed level   Lower Body Bathing: Total assistance;Bed level   Upper Body Dressing : Total assistance;Bed level   Lower  Body Dressing: Total assistance;Bed level   Toilet Transfer: Total assistance;+2 for physical assistance;+2 for safety/equipment   Toileting- Clothing Manipulation and Hygiene: Total assistance;Bed level       Functional mobility during ADLs: Total assistance;+2 for physical assistance       Vision   Vision  Assessment?: No apparent visual deficits Additional Comments: unable to formally assess due to cognitive impairments.     Perception     Praxis      Pertinent Vitals/Pain Pain Assessment: Faces Pain Score: 0-No pain     Hand Dominance Right   Extremity/Trunk Assessment Upper Extremity Assessment Upper Extremity Assessment: RUE deficits/detail;LUE deficits/detail RUE Deficits / Details: decreased ROM, stiffness of joint (history of right sided hemiparesis) RUE Coordination: decreased fine motor;decreased gross motor LUE Deficits / Details: Patient did not actively move UE on commands, grossly able to passively range in [facility]   Lower Extremity Assessment Lower Extremity Assessment: Defer to PT evaluation   Cervical / Trunk Assessment Cervical / Trunk Assessment: Normal   Communication Communication Communication: HOH (patient reports new onset HOH since May 25.)   Cognition Arousal/Alertness: Awake/alert Behavior During Therapy: Restless;Flat affect Overall Cognitive Status: Impaired/Different from baseline Area of Impairment: Orientation;Attention;Memory;Following commands;Awareness;Safety/judgement;Problem solving                 Orientation Level: Person Current Attention Level: Focused Memory: Decreased short-term memory Following Commands: Follows one step commands inconsistently Safety/Judgement: Decreased awareness of safety;Decreased awareness of deficits Awareness: Intellectual Problem Solving: Slow processing;Decreased initiation;Difficulty sequencing;Requires tactile cues     General Comments       Exercises     Shoulder Instructions      Home Living Family/patient expects to be discharged to:: Skilled nursing facility                                 Additional Comments: Patient lived at home with husband and Hospice services prior to admission.      Prior Functioning/Environment Level of Independence: Needs assistance  Gait /  Transfers Assistance Needed: walking with walker since prior admission. ADL's / Homemaking Assistance Needed: Hospice assists with bathing and getting patient in to shower. [name] assists "a little" with lower body dressing. reports she goes to the bathroom on her own. Communication / Swallowing Assistance Needed: HOH but able to speak and communicate.          OT Problem List: Decreased strength;Decreased activity tolerance;Impaired balance (sitting and/or standing);Decreased cognition;Decreased safety awareness;Impaired UE functional use;Decreased knowledge of use of DME or AE;Decreased knowledge of precautions      OT Treatment/Interventions: Self-care/ADL training;Therapeutic exercise;Energy conservation;DME and/or AE instruction;Therapeutic activities;Balance training;Patient/family education    OT Goals(Current goals can be found in the care plan section) Acute Rehab OT Goals Patient Stated Goal: to be able to walk and assist with ADLs OT Goal Formulation: With family Time For Goal Achievement: 07/12/21 Potential to Achieve Goals: Fair  OT Frequency: Min 2X/week   Barriers to D/C:            Co-evaluation              AM-PAC OT "6 Clicks" Daily Activity     Outcome Measure Help from another person eating meals?: Total Help from another person taking care of personal grooming?: Total Help from another person toileting, which includes using toliet, bedpan, or urinal?: Total Help from another person bathing (including washing, rinsing, drying)?: Total Help from  another person to put on and taking off regular upper body clothing?: Total Help from another person to put on and taking off regular lower body clothing?: Total 6 Click Score: 6   End of Session Nurse Communication: Mobility status  Activity Tolerance: Patient limited by fatigue Patient left: in bed;with call bell/phone within reach;with bed alarm set  OT Visit Diagnosis: Unsteadiness on feet  (R26.81);Muscle weakness (generalized) (M62.81);Other symptoms and signs involving cognitive function                Time: 4103-0131 OT Time Calculation (min): 13 min Charges:  OT General Charges $OT Visit: 1 Visit OT Evaluation $OT Eval Moderate Complexity: 1 Mod  Ngina Royer, OTR/L [place]  Office [phone] Pager: [place] 06/28/2021, 11:43 AM

## 2021-06-28 NOTE — Progress Notes (Addendum)
Blood transfusion Consent obtained from husband, Emilyann Banka via telephone; witnessed by Loren Racer, RN.

## 2021-06-28 NOTE — Progress Notes (Signed)
Patient was complaining of itchy skin, especiall back. Used a moisturizing skin cream which helped a little. [name] said at other hospital ([facility]) they ended up using a hydrocortisone cream which worked well.

## 2021-06-28 NOTE — Procedures (Signed)
Patient Name: Christine Gibson  MRN: [id number]  Epilepsy Attending: Lora Havens  Referring Physician/Provider: Dr Oren Binet Date: 06/27/2021 Duration: 24.19 mins  Patient history: 70 year old female with acute/subacute right frontal lobe contusion and altered mental status.  EEG evaluate for seizures.  Level of alertness: Awake  AEDs during EEG study: None  Technical aspects: This EEG study was done with scalp electrodes positioned according to the 10-20 International system of electrode placement. Electrical activity was acquired at a sampling rate of 500Hz  and reviewed with a high frequency filter of 70Hz  and a low frequency filter of 1Hz . EEG data were recorded continuously and digitally stored.   Description: No clear posterior dominant rhythm was seen.  EEG showed continuous generalized 3 to 5 Hz theta and delta slowing. Hyperventilation and photic stimulation were not performed.     ABNORMALITY - Continuous slow, generalized  IMPRESSION: This study is suggestive of moderate diffuse encephalopathy, nonspecific etiology. No seizures or epileptiform discharges were seen throughout the recording.   Warner Laduca Barbra Sarks

## 2021-06-28 NOTE — Progress Notes (Signed)
     Referral received for Christine Gibson :goals of care discussion. Chart reviewed and updates received from [employer]. Patient assessed and is unable to engage appropriately in discussions.   I was able to speak with patient's husband Abraham Margulies. [place] meeting scheduled for Thursday 9/8 @ 12:30pm. Family is aware that we will meet at patient's bedside and relocate to a conference room nearby. PMT contact information was provided.  Detailed note and recommendations to follow once GOC has been completed.   Thank you for your referral and allowing PMT to assist in Christine Gibson's care.   Dorthy Cooler, [facility] Palliative Medicine Team  Team Phone # [phone]   NO CHARGE

## 2021-06-28 NOTE — Progress Notes (Addendum)
Notified by RN that pt had a 2 minute seizure and is now post ictal. No prn meds for seizure available. Ordered ativan Q 4 hr as needed for seziure.  EEG ordered. Pt is still a full code.  If has recurrent seizure activity will start Keppra

## 2021-06-28 NOTE — Progress Notes (Signed)
PROGRESS NOTE        PATIENT DETAILS Name: Christine Gibson Age: 70 y.o. Sex: female Date of Birth: [birth year] Admit Date: 06/26/2021 Admitting Physician Etta Quill, DO TTS:VXBLT, Alyson Locket, NP  Brief Narrative: Patient is a 70 y.o. female ESRD on HD TTS, CLL, DM-2, HTN, HLD, recent CVA in July-presented to the hospital with confusion and low-grade fever.  Significant events: 9/5>> admit-for evaluation of confusion-MRI brain with acute/subacute right frontal hemorrhage/contusion.  Significant studies: 9/5>> CXR: Right midlung infiltrate. 9/6>> MRI brain: Anterior/inferior right frontal lobe-likely contusion. 9/6>> EEG: No seizures-moderate diffuse encephalopathy 9/6>> SARS-COV-2 antibody IgG: Not detected.  Antimicrobial therapy: Vancomycin: 9/5 x 1 Cefepime: 9/5>> Zithromax: 9/5>>  Microbiology data: 5/25>> COVID screening test positive at outside facility (per prior records) 7/19>> COVID PCR: Positive (CT value 24.4) 9/05>> COVID PCR: Positive (CT value 20.4) 9/05>> blood culture: 1/2 Gram-positive cocci (likely coag negative staph)  Procedures : None  Consults: Renal Neurosurgery Neurology (over the phone by admitting MD)  DVT Prophylaxis : SCDs Start: 06/26/21 2226   Subjective: Lying comfortably in bed.  Able to answer simple questions appropriately-seems to be very hard of hearing as well.  Seems to repeat some of the words still.  No family at bedside-unclear how far from her baseline she is.   Assessment/Plan: Acute metabolic encephalopathy: Seems to have improved-no family at bedside to collaborate with her baseline is usually like.  Etiology of encephalopathy felt to be multifactorial-due to COVID-19 infection/pneumonia/frontal lobe contusion.  Acute/subacute right frontal lobe contusion: Recommendations from neurosurgery are to continue supportive care.  From my conversation with patient's spouse apparently patient did fall and  hit her head sometime in early August.    Aspiration pneumonia: Continue empiric antimicrobial therapy.  Her CT/chest x-ray imaging is more consistent with aspiration pneumonia rather than COVID-19 pneumonia.    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  Unclear whether she was still taking zanubrutinib prior to this hospitalization as well.  She received a monoclonal antibody infusion on 9/6.  Recent Labs    06/26/21 2000 06/28/21 0108  DDIMER 0.80*  --   CRP 9.5* 8.6*    ESRD: On HD TTS-nephrology following for HD needs.  Chronic lymphocytic leukemia with anemia/thrombocytopenia: She follows with oncology at [facility] last oncology note on 04/07/2021-plans were to stop zanubrutinib and initiate hospice care.  I touched base with the patient's husband-explained what patient's primary oncologist had recommended-have asked palliative care to continue goals of care conversation.  Spouse aware that patient will likely continue to deteriorate (apparently has deteriorated quite a bit since she last saw oncologist in Major acute CVA/cognitive decline/frailty worsening etc.)  Anemia: Likely multifactorial due to CLL and ESRD.  Transfuse 1 unit of PRBC on 9/7-follow-up posttransfusion CBC.  Thrombocytopenia: Likely due to CLL-platelet count fluctuating-reviewed last oncology note on 6/17-recommendations were to stop zanubrutinib-and initiate hospice care.  Discussed briefly with patient's husband-palliative care consulted.  Not sure if any further platelet transfusion is going to be beneficial given that she appears to have advanced CLL (transfused 1 unit of pheresis platelets on 9/6).  Repeat CBC tomorrow.  Suspect better  served by initiation of  hospice care as recommended by oncology.  CVA: Hold antiplatelets due to hemorrhage/contusion seen on MRI brain.  HTN: BP reasonable-continue amlodipine-should improve further with HD.  Coreg/losartan on hold.    DM-2 (A1c 6.0): Diet controlled at home-start SSI and follow  Hard of hearing  Palliative care: Long discussion with patient's spouse over the phone on 9/7-I went over progress note written by oncology at [facility] on 6/17-he acknowledges significant cognitive/physical decline in patient since her last appointment with oncology.  Explained that patient likely will continue to deteriorate over the next few weeks.  Have asked palliative care to continue to engage family/patient for further delineation of goals of care.  Full code for now.  Overall prognosis is very poor.   Diet: Diet Order             Diet renal/carb modified with fluid restriction Diet-HS Snack? Nothing; Fluid restriction: 1200 mL Fluid; Room service appropriate? Yes; Fluid consistency: Thin  Diet effective now                    Code Status: Full code   Family Communication: Spoke with Spouse [name]-[phone]-over phone on 9/6  Disposition Plan: Status is: Inpatient  Remains inpatient appropriate because:Inpatient level of care appropriate due to severity of illness  Dispo: The patient is from: Home              Anticipated d/c is to: Home              Patient currently is not medically stable to d/c.   Difficult to place patient No    Barriers to Discharge: Encephalopathic-recurrent COVID-19 infection-not yet stable for discharge-see above documentation.  Antimicrobial agents: Anti-infectives (From admission, onward)    Start     Dose/Rate Route Frequency Ordered Stop   06/27/21 2100  ceFEPIme (MAXIPIME) 1 g in sodium chloride 0.9 % 100 mL IVPB        1 g 200 mL/hr over 30 Minutes Intravenous Every 24 hours 06/26/21 1949 06/30/21 2359   06/26/21 2230  azithromycin (ZITHROMAX) 500 mg  in sodium chloride 0.9 % 250 mL IVPB        500 mg 250 mL/hr over 60 Minutes Intravenous Every 24 hours 06/26/21 2221 07/01/21 2229   06/26/21 1930  vancomycin (VANCOCIN) IVPB 1000 mg/200 mL premix        1,000 mg 200 mL/hr over 60 Minutes Intravenous  Once 06/26/21 1846 06/26/21 2052   06/26/21 1900  ceFEPIme (MAXIPIME) 2 g in sodium chloride 0.9 % 100 mL IVPB        2 g 200 mL/hr over 30 Minutes Intravenous  Once 06/26/21 1846 06/26/21 1937   06/26/21 1845  vancomycin variable dose per unstable renal function (pharmacist dosing)  Status:  Discontinued         Does not apply See admin instructions 06/26/21 1846 06/26/21 2221        Time spent: 35 minutes-Greater than 50% of this time was spent in counseling, explanation of diagnosis, planning of further management, and coordination of care.  MEDICATIONS: Scheduled Meds:  sodium chloride   Intravenous Once   amLODipine  5 mg Oral Daily   Chlorhexidine Gluconate Cloth  6 each Topical Q0600   insulin aspart  0-6 Units Subcutaneous TID WC   Continuous Infusions:  sodium chloride     sodium chloride     sodium chloride Stopped (06/28/21 0944)   azithromycin Stopped (06/28/21 0700)  ceFEPime (MAXIPIME) IV Stopped (06/28/21 0600)   PRN Meds:.sodium chloride, sodium chloride, sodium chloride, acetaminophen **OR** acetaminophen, albuterol, alteplase, diphenhydrAMINE, EPINEPHrine, famotidine (PEPCID) IV, heparin, lidocaine (PF), lidocaine-prilocaine, methylPREDNISolone (SOLU-MEDROL) injection, morphine injection, ondansetron **OR** ondansetron (ZOFRAN) IV, pentafluoroprop-tetrafluoroeth   PHYSICAL EXAM: Vital signs: Vitals:   06/28/21 0728 06/28/21 0935 06/28/21 0938 06/28/21 1146  BP: (!) 161/65 (!) 148/73 (!) 148/73 (!) 159/66  Pulse: 75 73  74  Resp: 14 16 16 12   Temp: 98.4 F (36.9 C) 98.4 F (36.9 C)  98.1 F (36.7 C)  TempSrc: Oral Oral  Axillary  SpO2: 92% 98%  96%   There were no vitals filed for this visit. There  is no height or weight on file to calculate BMI.   Gen Exam: Not in any distress HEENT:atraumatic, normocephalic Chest: B/L clear to auscultation anteriorly CVS:S1S2 regular Abdomen:soft non tender, non distended Extremities:no edema Neurology: Moving all 4 extremities. Skin: no rash   I have personally reviewed following labs and imaging studies  LABORATORY DATA: CBC: Recent Labs  Lab 06/26/21 1800 06/27/21 0816 06/27/21 1317 06/28/21 0108 06/28/21 1215  WBC 4.0 4.3 3.8* 2.3*  --   NEUTROABS 0.9*  --   --   --   --   HGB 10.6* 10.6* 9.9* 7.0* 12.7  HCT 35.5* 34.8* 32.9* 22.9* 40.4  MCV 91.3 89.7 89.9 90.5  --   PLT 42* 47* 43* 28*  --      Basic Metabolic Panel: Recent Labs  Lab 06/26/21 1800 06/27/21 0816 06/28/21 0315  NA 142 143 143  K 4.5 4.6 4.7  CL 103 105 103  CO2 25 24 24   GLUCOSE 134* 91 83  BUN 35* 40* 44*  CREATININE 6.50* 7.10* 7.79*  CALCIUM 8.3* 8.3* 8.3*  PHOS  --   --  8.1*     GFR: CrCl cannot be calculated (Unknown ideal weight.).  Liver Function Tests: Recent Labs  Lab 06/26/21 1800 06/27/21 0816 06/28/21 0315  AST 26 21  --   ALT 12 12  --   ALKPHOS 53 50  --   BILITOT 0.5 0.7  --   PROT 4.6* 4.8*  --   ALBUMIN 2.4* 2.4* 2.3*    No results for input(s): LIPASE, AMYLASE in the last 168 hours. Recent Labs  Lab 06/26/21 2204  AMMONIA 34     Coagulation Profile: Recent Labs  Lab 06/26/21 1800  INR 1.2     Cardiac Enzymes: No results for input(s): CKTOTAL, CKMB, CKMBINDEX, TROPONINI in the last 168 hours.  BNP (last 3 results) No results for input(s): PROBNP in the last 8760 hours.  Lipid Profile: No results for input(s): CHOL, HDL, LDLCALC, TRIG, CHOLHDL, LDLDIRECT in the last 72 hours.  Thyroid Function Tests: No results for input(s): TSH, T4TOTAL, FREET4, T3FREE, THYROIDAB in the last 72 hours.  Anemia Panel: No results for input(s): VITAMINB12, FOLATE, FERRITIN, TIBC, IRON, RETICCTPCT in the last 72  hours.  Urine analysis:    Component Value Date/Time   COLORURINE STRAW (A) 01/12/2019 1130   APPEARANCEUR CLEAR 01/12/2019 1130   LABSPEC 1.007 01/12/2019 1130   PHURINE 7.0 01/12/2019 1130   GLUCOSEU 150 (A) 01/12/2019 1130   HGBUR NEGATIVE 01/12/2019 1130   BILIRUBINUR NEGATIVE 01/12/2019 1130   KETONESUR NEGATIVE 01/12/2019 1130   PROTEINUR >=300 (A) 01/12/2019 1130   NITRITE NEGATIVE 01/12/2019 1130   LEUKOCYTESUR NEGATIVE 01/12/2019 1130    Sepsis Labs: Lactic Acid, Venous    Component Value Date/Time   LATICACIDVEN  1.1 06/26/2021 2204    MICROBIOLOGY: Recent Results (from the past 240 hour(s))  Blood Culture (routine x 2)     Status: None (Preliminary result)   Collection Time: 06/26/21  5:26 PM   Specimen: BLOOD RIGHT HAND  Result Value Ref Range Status   Specimen Description BLOOD RIGHT HAND  Final   Special Requests   Final    BOTTLES DRAWN AEROBIC ONLY Blood Culture results may not be optimal due to an inadequate volume of blood received in culture bottles   Culture  Setup Time   Final    GRAM POSITIVE COCCI AEROBIC BOTTLE ONLY CRITICAL RESULT CALLED TO, READ BACK BY AND VERIFIED WITH: PHARMD A LAWLESS [id number] AT 854 AM BY CM Performed at [facility], [place] [address], [place], [place] [address]    Culture GRAM POSITIVE COCCI  Final   Report Status PENDING  Incomplete  Blood Culture ID Panel (Reflexed)     Status: Abnormal   Collection Time: 06/26/21  5:26 PM  Result Value Ref Range Status   Enterococcus faecalis NOT DETECTED NOT DETECTED Final   Enterococcus Faecium NOT DETECTED NOT DETECTED Final   Listeria monocytogenes NOT DETECTED NOT DETECTED Final   Staphylococcus species DETECTED (A) NOT DETECTED Final    Comment: CRITICAL RESULT CALLED TO, READ BACK BY AND VERIFIED WITH: PHARMD A LAWLESS [id number] AT 855 AM BY CM    Staphylococcus aureus (BCID) NOT DETECTED NOT DETECTED Final   Staphylococcus epidermidis DETECTED (A) NOT DETECTED Final     Comment: Methicillin (oxacillin) resistant coagulase negative staphylococcus. Possible blood culture contaminant (unless isolated from more than one blood culture draw or clinical case suggests pathogenicity). No antibiotic treatment is indicated for blood  culture contaminants. CRITICAL RESULT CALLED TO, READ BACK BY AND VERIFIED WITH: PHARMD A LAWLESS [id number] AT 23 AM BY CM    Staphylococcus lugdunensis NOT DETECTED NOT DETECTED Final   Streptococcus species NOT DETECTED NOT DETECTED Final   Streptococcus agalactiae NOT DETECTED NOT DETECTED Final   Streptococcus pneumoniae NOT DETECTED NOT DETECTED Final   Streptococcus pyogenes NOT DETECTED NOT DETECTED Final   A.calcoaceticus-baumannii NOT DETECTED NOT DETECTED Final   Bacteroides fragilis NOT DETECTED NOT DETECTED Final   Enterobacterales NOT DETECTED NOT DETECTED Final   Enterobacter cloacae complex NOT DETECTED NOT DETECTED Final   Escherichia coli NOT DETECTED NOT DETECTED Final   Klebsiella aerogenes NOT DETECTED NOT DETECTED Final   Klebsiella oxytoca NOT DETECTED NOT DETECTED Final   Klebsiella pneumoniae NOT DETECTED NOT DETECTED Final   Proteus species NOT DETECTED NOT DETECTED Final   Salmonella species NOT DETECTED NOT DETECTED Final   Serratia marcescens NOT DETECTED NOT DETECTED Final   Haemophilus influenzae NOT DETECTED NOT DETECTED Final   Neisseria meningitidis NOT DETECTED NOT DETECTED Final   Pseudomonas aeruginosa NOT DETECTED NOT DETECTED Final   Stenotrophomonas maltophilia NOT DETECTED NOT DETECTED Final   Candida albicans NOT DETECTED NOT DETECTED Final   Candida auris NOT DETECTED NOT DETECTED Final   Candida glabrata NOT DETECTED NOT DETECTED Final   Candida krusei NOT DETECTED NOT DETECTED Final   Candida parapsilosis NOT DETECTED NOT DETECTED Final   Candida tropicalis NOT DETECTED NOT DETECTED Final   Cryptococcus neoformans/gattii NOT DETECTED NOT DETECTED Final   Methicillin resistance mecA/C  DETECTED (A) NOT DETECTED Final    Comment: CRITICAL RESULT CALLED TO, READ BACK BY AND VERIFIED WITH: PHARMD A LAWLESS [id number] AT 855 AM BY CM Performed at [facility]  Lab, [address], [place], [place] [address]   Blood Culture (routine x 2)     Status: None (Preliminary result)   Collection Time: 06/26/21  5:31 PM   Specimen: BLOOD  Result Value Ref Range Status   Specimen Description BLOOD RIGHT ANTECUBITAL  Final   Special Requests   Final    BOTTLES DRAWN AEROBIC AND ANAEROBIC Blood Culture results may not be optimal due to an inadequate volume of blood received in culture bottles   Culture   Final    NO GROWTH 2 DAYS Performed at [facility], [place] [address], [place], [place] [address]    Report Status PENDING  Incomplete  Resp Panel by RT-PCR (Flu A&B, Covid) Nasopharyngeal Swab     Status: Abnormal   Collection Time: 06/26/21  6:10 PM   Specimen: Nasopharyngeal Swab; Nasopharyngeal(NP) swabs in vial transport medium  Result Value Ref Range Status   SARS Coronavirus 2 by RT PCR POSITIVE (A) NEGATIVE Final    Comment: RESULT CALLED TO, READ BACK BY AND VERIFIED WITH: MEGAN RUGGIEO 06/26/21 @1935   BY JW (NOTE) SARS-CoV-2 target nucleic acids are DETECTED.  The SARS-CoV-2 RNA is generally detectable in upper respiratory specimens during the acute phase of infection. Positive results are indicative of the presence of the identified virus, but do not rule out bacterial infection or co-infection with other pathogens not detected by the test. Clinical correlation with patient history and other diagnostic information is necessary to determine patient infection status. The expected result is Negative.  Fact Sheet for Patients: EntrepreneurPulse.com.au  Fact Sheet for Healthcare Providers: IncredibleEmployment.be  This test is not yet approved or cleared by the [place] FDA and  has been authorized for detection and/or diagnosis  of SARS-CoV-2 by FDA under an Emergency Use Authorization (EUA).  This EUA will remain in effect (meaning this test can be  used) for the duration of  the COVID-19 declaration under Section 564(b)(1) of the Act, 21 U.S.C. section 360bbb-3(b)(1), unless the authorization is terminated or revoked sooner.     Influenza A by PCR NEGATIVE NEGATIVE Final   Influenza B by PCR NEGATIVE NEGATIVE Final    Comment: (NOTE) The Xpert Xpress SARS-CoV-2/FLU/RSV plus assay is intended as an aid in the diagnosis of influenza from Nasopharyngeal swab specimens and should not be used as a sole basis for treatment. Nasal washings and aspirates are unacceptable for Xpert Xpress SARS-CoV-2/FLU/RSV testing.  Fact Sheet for Patients: EntrepreneurPulse.com.au  Fact Sheet for Healthcare Providers: IncredibleEmployment.be  This test is not yet approved or cleared by the [place] FDA and has been authorized for detection and/or diagnosis of SARS-CoV-2 by FDA under an Emergency Use Authorization (EUA). This EUA will remain in effect (meaning this test can be used) for the duration of the COVID-19 declaration under Section 564(b)(1) of the Act, 21 U.S.C. section 360bbb-3(b)(1), unless the authorization is terminated or revoked.  Performed at [facility], [place] [address] [place], [place] [address]     RADIOLOGY STUDIES/RESULTS: DG Chest 1 View  Result Date: 06/26/2021 CLINICAL DATA:  Fever. EXAM: CHEST  1 VIEW COMPARISON:  Chest x-ray 07/09/2021. FINDINGS: Cardiac silhouette is enlarged, unchanged. Small left pleural effusion and patchy opacities in the left lung base persists. There is new focal opacity in medial right mid lung overlying the right hilar region. There may also be some new right hilar enlargement. There is no evidence for pneumothorax. No acute fractures are seen. IMPRESSION: 1. New right midlung infiltrate.  Questionable new right hilar  enlargement. Findings may be related to pneumonia with right hilar lymphadenopathy. Short-term follow-up two views chest and/or CT recommended to exclude underlying mass at this level. 2. Stable cardiomegaly. 3. Stable small left pleural effusion with left basilar atelectasis/airspace disease. Electronically Signed   By: Ronney Asters M.D.   On: 06/26/2021 18:15   CT HEAD WO CONTRAST (5MM)  Result Date: 06/26/2021 CLINICAL DATA:  Altered mental status. EXAM: CT HEAD WITHOUT CONTRAST TECHNIQUE: Contiguous axial images were obtained from the base of the skull through the vertex without intravenous contrast. COMPARISON:  May 09, 2021 FINDINGS: Brain: Questionable new hypoattenuating area in the right thalamus/basal ganglia on image 15/4. Areas of encephalomalacia and calcifications in the bilateral cerebellum, larger on the right, previously evaluated on MRI May 09, 2021 and consistent with prior infarctions. Moderate burden chronic ischemic white matter disease. Lacunar type infarct in the left thalamus. Global parenchymal volume loss with ex vacuo dilatation of ventricular system. Vascular: No hyperdense vessel. Calcified intracranial atherosclerosis. Skull: No acute fracture. Sinuses/Orbits: Near complete opacification of the paranasal sinuses, similar to prior. Other: Progressive opacification of bilateral mastoid sinuses with bilateral middle ear fluid. IMPRESSION: 1. Questionable hypoattenuating area in the right thalamus/basal ganglia which may reflect an acute/subacute infarction. Consider further evaluation with MRI brain. 2. Otherwise similar appearance of chronic findings including prior infarctions and chronic ischemic white matter disease. 3. Progressive opacification of bilateral mastoid sinuses and bilateral middle ear fluid. 4. Pansinusitis. These results were called by telephone at the time of interpretation on 06/26/2021 at 9:33 pm to provider Dr. Verner Chol, who verbally acknowledged these results.  Electronically Signed   By: Dahlia Bailiff M.D.   On: 06/26/2021 21:35   CT CHEST WO CONTRAST  Result Date: 06/27/2021 CLINICAL DATA:  Question right hilar infiltrate on chest radiograph EXAM: CT CHEST WITHOUT CONTRAST TECHNIQUE: Multidetector CT imaging of the chest was performed following the standard protocol without IV contrast. COMPARISON:  Chest radiograph 06/26/2021 FINDINGS: Cardiovascular: The heart is at the upper limits of normal for size. There is trace pericardial fluid. There are aortic valve calcifications and coronary artery calcifications. There is calcified atherosclerotic plaque of the thoracic aorta. Mediastinum/Nodes: The thyroid is unremarkable. The esophagus is not well assessed. There is no bulky mediastinal lymphadenopathy. There are a few prominent right axillary lymph nodes measuring up to 7 mm, nonspecific. There is fullness in the left suprahilar region, incompletely characterized in the absence of intravenous contrast but favored to reflect a vascular structure. Lungs/Pleura: The trachea is patent. There is occlusion of the left lower lobe bronchus probably by mucoid secretions. There is complete postobstructive atelectasis of the left lower lobe. There is also near complete collapse of the right lower lobe with patchy opacities in the aerated portion of the lateral segment. There are patchy ground-glass opacities in the right apex As above, there is fullness in the left suprahilar region (3-18). There is no pleural effusion or pneumothorax. Upper Abdomen: The kidneys are atrophic. The spleen is enlarged measuring up to 13.8 cm AP. Musculoskeletal: There is slight deformity of the left eleventh rib consistent with prior trauma. There is no acute osseous abnormality. IMPRESSION: 1. Debris in the left lower lobe bronchus with complete collapse of the left lower lobe, and near complete collapse of the right middle lobe likely due to aspiration with mucous plugging. 2. Patchy opacities  in the aerated right middle lobe and right apex also favored to reflect aspiration. 3. Fullness in the left  suprahilar region is favored to reflect vasculature; however, lymphadenopathy can not be excluded in the absence of intravenous contrast. 4. Recommend follow-up CT chest with intravenous contrast (if not contraindicated) in 1-3 months to assess for resolution of the above. 5. Splenomegaly. 6. Trace pericardial effusion. 7. Extensive coronary artery calcifications and aortic Atherosclerosis (ICD10-I70.0). Electronically Signed   By: Valetta Mole M.D.   On: 06/27/2021 16:06   MR BRAIN WO CONTRAST  Result Date: 06/27/2021 CLINICAL DATA:  Initial evaluation for altered mental status, neuro deficit, stroke suspected. EXAM: MRI HEAD WITHOUT CONTRAST TECHNIQUE: Multiplanar, multiecho pulse sequences of the brain and surrounding structures were obtained without intravenous contrast. COMPARISON:  CT from 06/26/2021 as well as previous exams from 05/10/2021 and 05/09/2021. FINDINGS: Brain: Examination moderately degraded by motion artifact, limiting assessment. Diffuse prominence of the CSF containing spaces compatible with generalized age-related cerebral atrophy. Patchy T2/FLAIR hyperintensity about the periventricular and deep white matter both cerebral hemispheres most consistent with chronic microvascular ischemic disease, moderate in nature, and grossly stable from previous. Superimposed small remote left thalamic lacunar infarct with chronic hemosiderin staining. Scattered remote bilateral cerebellar infarcts again seen, right greater than left. Associated susceptibility artifact consistent with dystrophic calcification and/or chronic blood products as seen on prior studies, stable. There is a 2 cm focus of FLAIR and T1 hyperintensity involving the anterior/inferior aspect of the right frontal lobe (series 4, image 17), new as compared to prior MRI. In comparison with prior CT, there appears to be a subtle  small extra-axial hemorrhage and probable parenchymal contusion at this location (series 5, image 16 on prior CT). This is suspected to be subacute in nature. No significant regional mass effect or edema. No other evidence for acute or subacute infarct. Gray-white matter differentiation otherwise maintained. No other evidence for acute or chronic intracranial hemorrhage. No mass lesion, midline shift or mass effect. No hydrocephalus. No other visible extra-axial fluid collection. Pituitary gland suprasellar region grossly normal. Midline structures grossly intact. Vascular: Major intracranial vascular flow voids are grossly maintained. Skull and upper cervical spine: Craniocervical junction grossly within normal limits. Bone marrow signal intensity diffusely heterogeneous. No visible focal marrow replacing lesion seen on this motion degraded exam. No visible scalp soft tissue abnormality. Sinuses/Orbits: Globes and orbital soft tissues demonstrate no acute finding. Extensive pan sinusitis with large bilateral mastoid effusions noted. Inner ear structures grossly normal. Other: None. IMPRESSION: 1. Motion degraded exam. 2. 2 cm focus of FLAIR and T1 hyperintensity involving the anterior/inferior right frontal lobe, new as compared to recent MRI from 05/09/2021. In comparison with prior CT from 06/26/2021, there appears to be a small extra-axial hemorrhage and probable parenchymal contusion at this location. This is likely subacute in nature without significant regional mass effect or edema. Correlation with history for possible recent trauma suggested. Appearance would be atypical and unusual for intracranial spread of infection from adjacent paranasal sinus disease, which would be the primary differential consideration. Short interval follow-up MRI in 2-3 months to ensure these changes resolve is suggested. If there is concern for possible intracranial infection, then correlation with dedicated LP and CSF studies  could be performed as warranted. 3. No other acute intracranial abnormality. 4. 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 5. Extensive pan sinusitis with large bilateral mastoid effusions. Results were communicated by telephone at the time of interpretation on 06/27/2021 at 2:32 am to provider JARED GARDNER. Electronically Signed   By: Jeannine Boga M.D.   On:  06/27/2021 02:45   EEG adult  Result Date: 06/28/2021 Lora Havens, MD     06/28/2021 11:04 AM Patient Name: DEZAREE TRACEY MRN: [id number] Epilepsy Attending: Lora Havens Referring Physician/Provider: Dr Oren Binet Date: 06/27/2021 Duration: 24.19 mins Patient history: 71 year old female with acute/subacute right frontal lobe contusion and altered mental status.  EEG evaluate for seizures. Level of alertness: Awake AEDs during EEG study: None Technical aspects: This EEG study was done with scalp electrodes positioned according to the 10-20 International system of electrode placement. Electrical activity was acquired at a sampling rate of 500Hz  and reviewed with a high frequency filter of 70Hz  and a low frequency filter of 1Hz . EEG data were recorded continuously and digitally stored. Description: No clear posterior dominant rhythm was seen.  EEG showed continuous generalized 3 to 5 Hz theta and delta slowing. Hyperventilation and photic stimulation were not performed.   ABNORMALITY - Continuous slow, generalized IMPRESSION: This study is suggestive of moderate diffuse encephalopathy, nonspecific etiology. No seizures or epileptiform discharges were seen throughout the recording. Priyanka Barbra Sarks     LOS: 2 days   Oren Binet, MD  [facility]    To contact the attending provider between 7A-7P or the covering provider during after hours 7P-7A, please log into the web site [email or web address] and access using universal [place] password for that web  site. If you do not have the password, please call the hospital operator.  06/28/2021, 1:57 PM

## 2021-06-28 NOTE — Progress Notes (Addendum)
[facility] Progress Note  Subjective: seen in room, confused   Vitals:   06/28/21 0728 06/28/21 0935 06/28/21 0938 06/28/21 1146  BP: (!) 161/65 (!) 148/73 (!) 148/73 (!) 159/66  Pulse: 75 73  74  Resp: 14 16 16 12   Temp: 98.4 F (36.9 C) 98.4 F (36.9 C)  98.1 F (36.7 C)  TempSrc: Oral Oral  Axillary  SpO2: 92% 98%  96%    Exam:  Lethargic, confused, nad   no jvd  Chest cta bilat  Cor reg no RG  Abd soft ntnd no ascites   Ext no LE edema   Nonfocal, confused   L arm AVG+bruit        Home meds include norvasc, lipitor, coreg, ativan prn, losartan, roxanol prn, adalat 60 qd, oxy IR prn, protonix, zoloft, Zanubrutinib tiw po, prn's    CXR - IMPRESSION: 1. New right midlung infiltrate. Questionable new right hilar enlargement. Findings may be related to pneumonia with right hilar lymphadenopathy. Short-term follow-up two views chest and/or CT recommended to exclude underlying mass at this level.    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  getting HD today off schedule, then resume HD tomorrow to get back on schedule.  Acute/ subactue R frontal lobe contusion: per Nsurg supportive care AMS - possible CVA, vs delirium d/t PNA +/- other Recurrent COVID infection - was + first during May 2022 admit, has been testing + repeatedly since then. COVID IgG Ab's were measured here and came back at non-reactive, despite full vaccination course.  CLL/ chronic thrombocytopenia - f/u [facility]. Last seen in June, 2022 by [facility] and plans were to stop zanubrutinib and initiate hospice care.  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 Ca 8.3, phos 8 HTN/ volume - no vol excess on exam, BP's high today. Resuming norvasc, resume others as  needed (coreg, losartan).            Rob Teran Knittle 06/28/2021, 3:26 PM   Recent Labs  Lab 06/27/21 0816 06/27/21 1317 06/28/21 0108 06/28/21 0315 06/28/21 1215  K 4.6  --   --  4.7  --   BUN 40*  --   --  44*  --   CREATININE 7.10*  --   --  7.79*  --   CALCIUM 8.3*  --   --  8.3*  --   PHOS  --   --   --  8.1*  --   HGB 10.6*   < > 7.0*  --  12.7   < > = values in this interval not displayed.   Inpatient medications:  sodium chloride   Intravenous Once   amLODipine  5 mg Oral Daily   Chlorhexidine Gluconate Cloth  6 each Topical Q0600   insulin aspart  0-6 Units Subcutaneous TID WC    sodium chloride     sodium chloride     azithromycin Stopped (06/28/21 0700)   ceFEPime (MAXIPIME) IV Stopped (06/28/21 0600)   sodium chloride, sodium chloride, acetaminophen **OR** acetaminophen, alteplase, heparin, lidocaine (PF), lidocaine-prilocaine, morphine injection, ondansetron **OR** ondansetron (ZOFRAN) IV, pentafluoroprop-tetrafluoroeth

## 2021-06-28 NOTE — Plan of Care (Signed)
  Problem: Health Behavior/Discharge Planning: Goal: Ability to manage health-related needs will improve Outcome: Not Progressing   

## 2021-06-28 NOTE — Progress Notes (Signed)
   06/28/21 2037  Assess: MEWS Score  Temp 98.6 F (37 C)  BP (!) 187/63  Pulse Rate 73  ECG Heart Rate 72  Resp 18  SpO2 100 %  O2 Device Nasal Cannula  Patient Activity (if Appropriate) In bed  O2 Flow Rate (L/min) 2 L/min  Assess: MEWS Score  MEWS Temp 0  MEWS Systolic 0  MEWS Pulse 0  MEWS RR 0  MEWS LOC 3  MEWS Score 3  MEWS Score Color Yellow  Assess: if the MEWS score is Yellow or Red  Were vital signs taken at a resting state? No  Focused Assessment Change from prior assessment (see assessment flowsheet)  Early Detection of Sepsis Score *See Row Information* High  MEWS guidelines implemented *See Row Information* Yes  Treat  MEWS Interventions Escalated (See documentation below)  Pain Scale PAINAD  Breathing 0  Negative Vocalization 1  Facial Expression 0  Body Language 0  Consolability 1  PAINAD Score 2  Take Vital Signs  Increase Vital Sign Frequency  Yellow: Q 2hr X 2 then Q 4hr X 2, if remains yellow, continue Q 4hrs  Escalate  MEWS: Escalate Yellow: discuss with charge nurse/RN and consider discussing with provider and RRT  Notify: Charge Nurse/RN  Name of Charge Nurse/RN Notified Wenda Overland, RN  Date Charge Nurse/RN Notified 06/28/21  Time Charge Nurse/RN Notified 2037  Notify: Provider  Provider Name/Title Dr. Tonie Griffith MD  Date Provider Notified 06/28/21  Time Provider Notified 2340  Notification Type Call  Notification Reason Change in status;Other (Comment) (seizure)  Provider response See new orders  Date of Provider Response 06/28/21  Time of Provider Response 2042  Document  Patient Outcome Stabilized after interventions  Progress note created (see row info) Yes

## 2021-06-28 NOTE — Plan of Care (Signed)
  Problem: Health Behavior/Discharge Planning: Goal: Ability to manage health-related needs will improve Outcome: Progressing   Problem: Coping: Goal: Ability to identify and develop effective coping behavior will improve Outcome: Progressing   Problem: Respiratory: Goal: Verbalizations of increased ease of respirations will increase Outcome: Progressing   Problem: Role Relationship: Goal: Ability to verbalize concerns, feelings, and thoughts to partner or family member will improve Outcome: Progressing

## 2021-06-28 NOTE — Progress Notes (Signed)
Patient came back to the unit from HD at around [facility]; at around 8:35PM, RN was notified that patient was having seizure. Tonic clonic seizure noted and lasted for about 2 minutes, VS monitored (see VS flowsheet). MD notified (see orders).

## 2021-06-29 ENCOUNTER — Inpatient Hospital Stay (HOSPITAL_COMMUNITY): Payer: Medicare Other

## 2021-06-29 DIAGNOSIS — J189 Pneumonia, unspecified organism: Secondary | ICD-10-CM | POA: Diagnosis not present

## 2021-06-29 DIAGNOSIS — R569 Unspecified convulsions: Secondary | ICD-10-CM | POA: Diagnosis not present

## 2021-06-29 DIAGNOSIS — D631 Anemia in chronic kidney disease: Secondary | ICD-10-CM | POA: Diagnosis not present

## 2021-06-29 DIAGNOSIS — N250 Renal osteodystrophy: Secondary | ICD-10-CM | POA: Diagnosis not present

## 2021-06-29 DIAGNOSIS — R531 Weakness: Secondary | ICD-10-CM | POA: Diagnosis not present

## 2021-06-29 DIAGNOSIS — R4182 Altered mental status, unspecified: Secondary | ICD-10-CM | POA: Diagnosis not present

## 2021-06-29 DIAGNOSIS — I120 Hypertensive chronic kidney disease with stage 5 chronic kidney disease or end stage renal disease: Secondary | ICD-10-CM | POA: Diagnosis not present

## 2021-06-29 DIAGNOSIS — N186 End stage renal disease: Secondary | ICD-10-CM | POA: Diagnosis not present

## 2021-06-29 DIAGNOSIS — C9110 Chronic lymphocytic leukemia of B-cell type not having achieved remission: Secondary | ICD-10-CM | POA: Diagnosis not present

## 2021-06-29 DIAGNOSIS — I639 Cerebral infarction, unspecified: Secondary | ICD-10-CM | POA: Diagnosis not present

## 2021-06-29 DIAGNOSIS — Z992 Dependence on renal dialysis: Secondary | ICD-10-CM | POA: Diagnosis not present

## 2021-06-29 LAB — BPAM RBC
Blood Product Expiration Date: 202210032359
ISSUE DATE / TIME: 202209070640
Unit Type and Rh: 7300

## 2021-06-29 LAB — CBC
HCT: 42.9 % (ref 36.0–46.0)
Hemoglobin: 13.7 g/dL (ref 12.0–15.0)
MCH: 27.3 pg (ref 26.0–34.0)
MCHC: 31.9 g/dL (ref 30.0–36.0)
MCV: 85.6 fL (ref 80.0–100.0)
Platelets: 41 10*3/uL — ABNORMAL LOW (ref 150–400)
RBC: 5.01 MIL/uL (ref 3.87–5.11)
RDW: 17.3 % — ABNORMAL HIGH (ref 11.5–15.5)
WBC: 4.7 10*3/uL (ref 4.0–10.5)
nRBC: 0.4 % — ABNORMAL HIGH (ref 0.0–0.2)

## 2021-06-29 LAB — RENAL FUNCTION PANEL
Albumin: 2.3 g/dL — ABNORMAL LOW (ref 3.5–5.0)
Anion gap: 14 (ref 5–15)
BUN: 17 mg/dL (ref 8–23)
CO2: 25 mmol/L (ref 22–32)
Calcium: 7.8 mg/dL — ABNORMAL LOW (ref 8.9–10.3)
Chloride: 101 mmol/L (ref 98–111)
Creatinine, Ser: 5 mg/dL — ABNORMAL HIGH (ref 0.44–1.00)
GFR, Estimated: 9 mL/min — ABNORMAL LOW (ref >60)
Glucose, Bld: 80 mg/dL (ref 70–99)
Phosphorus: 4.6 mg/dL (ref 2.5–4.6)
Potassium: 3.7 mmol/L (ref 3.5–5.1)
Sodium: 140 mmol/L (ref 135–145)

## 2021-06-29 LAB — TYPE AND SCREEN
ABO/RH(D): B POS
Antibody Screen: NEGATIVE
Unit division: 0

## 2021-06-29 LAB — MAGNESIUM: Magnesium: 1.7 mg/dL (ref 1.7–2.4)

## 2021-06-29 LAB — VITAMIN B12: Vitamin B-12: 3204 pg/mL — ABNORMAL HIGH (ref 180–914)

## 2021-06-29 LAB — GLUCOSE, CAPILLARY
Glucose-Capillary: 78 mg/dL (ref 70–99)
Glucose-Capillary: 113 mg/dL — ABNORMAL HIGH (ref 70–99)
Glucose-Capillary: 80 mg/dL (ref 70–99)
Glucose-Capillary: 95 mg/dL (ref 70–99)

## 2021-06-29 LAB — HEPATITIS B SURFACE ANTIGEN: Hepatitis B Surface Ag: NONREACTIVE

## 2021-06-29 LAB — TSH: TSH: 2.4 u[IU]/mL (ref 0.350–4.500)

## 2021-06-29 MED ORDER — SODIUM CHLORIDE 0.9 % IV SOLN
250.0000 mg | INTRAVENOUS | Status: DC
  Administered 2021-06-29 – 2021-07-04 (×2): 250 mg via INTRAVENOUS
  Filled 2021-06-29 (×7): qty 2.5

## 2021-06-29 MED ORDER — SODIUM CHLORIDE 0.9 % IV SOLN
3.0000 g | INTRAVENOUS | Status: DC
  Administered 2021-06-29 – 2021-07-02 (×4): 3 g via INTRAVENOUS
  Filled 2021-06-29 (×4): qty 8

## 2021-06-29 MED ORDER — DIPHENHYDRAMINE HCL 50 MG/ML IJ SOLN
25.0000 mg | Freq: Four times a day (QID) | INTRAMUSCULAR | Status: AC | PRN
  Administered 2021-06-29 – 2021-07-06 (×4): 25 mg via INTRAVENOUS
  Filled 2021-06-29 (×4): qty 1

## 2021-06-29 MED ORDER — LEVETIRACETAM IN NACL 500 MG/100ML IV SOLN
500.0000 mg | Freq: Every day | INTRAVENOUS | Status: DC
  Administered 2021-06-29 – 2021-07-10 (×11): 500 mg via INTRAVENOUS
  Filled 2021-06-29 (×12): qty 100

## 2021-06-29 MED ORDER — DIPHENHYDRAMINE HCL 50 MG/ML IJ SOLN
25.0000 mg | Freq: Once | INTRAMUSCULAR | Status: AC
  Administered 2021-06-29: 25 mg via INTRAVENOUS
  Filled 2021-06-29: qty 1

## 2021-06-29 NOTE — Progress Notes (Signed)
Pharmacy Antibiotic Note  Christine Gibson is a 70 y.o. female admitted on 06/26/2021 with pneumonia.  Pharmacy has been consulted for Unasyn dosing. S/p 3 days of Cefepime and Azithromycin. ESRD on dialysis. WBC wnl. Afebrile. Waxing and waning mental status.   Plan: Unasyn 3g IV every 24 hours.  Follow-up LOT.    Weight: 40.9 kg (90 lb 2.7 oz)  Temp (24hrs), Avg:98.5 F (36.9 C), Min:97.9 F (36.6 C), Max:99.1 F (37.3 C)  Recent Labs  Lab 06/26/21 1726 06/26/21 1800 06/26/21 2204 06/27/21 0816 06/27/21 1317 06/28/21 0108 06/28/21 0315 06/29/21 0307 06/29/21 0558  WBC  --  4.0  --  4.3 3.8* 2.3*  --  4.7  --   CREATININE  --  6.50*  --  7.10*  --   --  7.79*  --  5.00*  LATICACIDVEN 2.8*  --  1.1  --   --   --   --   --   --     Estimated Creatinine Clearance: 6.7 mL/min (A) (by C-G formula based on SCr of 5 mg/dL (H)).    Allergies  Allergen Reactions   Lisinopril Cough    Antimicrobials this admission: Cefepime 9/5 >>9/8 Azithromycin 9/5> 9/8 Vancomycin 9/5 x 1 Uansyn 9/8 >>  Dose adjustments this admission:   Microbiology results: 9/5 BCx 1/8 Staph Epi  Thank you for allowing pharmacy to be a part of this patient's care.  Sloan Leiter, PharmD, BCPS, BCCCP Clinical Pharmacist Please refer to [facility] for [place] numbers 06/29/2021 2:18 PM

## 2021-06-29 NOTE — Consult Note (Signed)
Consultation Note Date: 06/29/2021   Patient Name: Christine Gibson  DOB: [birth year]  MRN: [id number]  Age / Sex: 70 y.o., female  PCP: Christine Pitcher, NP Referring Physician: Jonetta Osgood, MD  Reason for Consultation: Establishing goals of care  HPI/Patient Profile: 70 y.o. 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  MRI brain showed acute/subacute right frontal hemorrhage/contusion. CXR consistent with aspiration PNA. Patient has also had recurrent COVID-19 infection for several months, with no immune response to infection or vaccinations likely due to CLL. Palliative care has been consulted to assist with goals of care conversation.  Clinical Assessment and Goals of Care:  I have reviewed medical records including EPIC notes, labs and imaging, received report from RN, assessed the patient and then met at the bedside along with patient's husband Christine Gibson to discuss diagnosis prognosis, [place] Gibson, EOL wishes, disposition and options. I was also joined by Dr. Jonnie Finner for a portion of this family meeting.  I introduced Palliative Medicine as specialized medical care for people living with serious illness. It focuses on providing relief from the symptoms and stress of a serious illness. The goal is to improve quality of life for both the patient and the family.  We discussed a brief life review of the patient and then focused on their current illness. The natural disease trajectory and expectations at EOL were discussed. Christine Gibson shares three sons with [place] who live in [place]. Unfortunately, their fourth son has passed away years ago after some time on dialysis. She served in [place] until 2014 due to health decline and Christine Gibson notes that it was difficult for her to  adjust to retirement. Christine Gibson is a strong-willed woman who has always cared for others by trying to do as much for herself as possible. She lives at home with [place] Gibson and unfortunately has been declining more rapidly over the past few months. She was initially on PD and then iHD since 09/2020. During a 03/2021 visit with oncology, hospice was recommended and she was told that she had two months to live. Christine Gibson tells me that she handled this well and wanted to get her nails done and have something good to eat. She continued with interventions such as HD and medications, and was expected to have another oncology appointment in September. Christine Gibson relies heavily on his faith and hopes for her improvement. He recognizes that this is the worst he has ever seen her and that she will likely not be strong enough to return home, even with some improvement. He also speaks of his bad experiences with hospice in the past during another marriage.    I attempted to elicit values and goals of care important to the patient.   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 She has also had loss of taste and worsening appetite. While she  has not specified her end-of-life preferences in conversations with [place] Gibson, we discussed her response to news of her poor prognosis and her apparent emphasis on comfort, dignity, and happiness from comfort foods at that time. She was a DNR for many years but changed her mind about a year ago. Discussed that cardiopulmonary resuscitation would likely cause more harm than benefit and recommended a DNR and focus on her comfort.  The difference between aggressive medical intervention and comfort care was considered in light of the patient's goals of care.   Advanced directives, concepts specific to code status, artifical feeding and hydration, and rehospitalization were considered and discussed at length.  Hospice and Palliative Care services outpatient  were explained and offered.  Discussed the importance of continued conversation with family and the medical providers regarding overall plan of care and treatment options, ensuring decisions are within the context of the patient's values and GOCs.    Questions and concerns were addressed.  Hard Choices booklet left for review. Gone From My Sight booklet left for review. The family was encouraged to call with questions or concerns.  PMT will continue to support holistically.   NEXT OF KIN is patient's husband [place] Gibson. No HCPOA on file.    SUMMARY OF RECOMMENDATIONS   -Full code/full scope -[name] is hopeful for improvement and also understands that she will likely continue to decline -Recommend DNR and transition to comfort care, ongoing discussions -Psychosocial and emotional support provided -Spiritual care consult appreciated  Code Status/Advance Care Planning: Full code  Palliative Prophylaxis:  Aspiration, Delirium Protocol, Frequent Pain Assessment, and Turn Reposition  Additional Recommendations (Limitations, Scope, Preferences): Full Scope Treatment  Psycho-social/Spiritual:  Desire for further Chaplaincy support:yes Additional Recommendations: Caregiving  Support/Resources, Education on Hospice, and Referral to [employer] [facility]   Prognosis:  Poor prognosis given multiple comorbidities and nutritional/functional/cognitive decline  Discharge Planning: To Be Determined      Primary Diagnoses: Present on Admission:  CLL (chronic lymphocytic leukemia) (HCC)  HTN (hypertension)  Thrombocytopenia ([place])  COVID-19 virus infection  Acute encephalopathy  Right middle lobe pneumonia  Acute CVA (cerebrovascular accident) ([place])  Anal warts  ICH (intracerebral hemorrhage) ([place])   I have reviewed the medical record, interviewed the patient and family, and examined the patient. The following aspects are pertinent.  Past Medical History:  Diagnosis Date    Chronic kidney disease    Diabetes mellitus without complication ([place])    ESRD (end stage renal disease) ([place])    Clinician notes   History of leukemia    Hypertension    Social History   Socioeconomic History   Marital status: Married    Spouse name: Not on file   Number of children: Not on file   Years of education: Not on file   Highest education level: Not on file  Occupational History   Not on file  Tobacco Use   Smoking status: Never   Smokeless tobacco: Never  Vaping Use   Vaping Use: Never used  Substance and Sexual Activity   Alcohol use: Yes    Alcohol/week: 0.0 standard drinks    Comment: occassional   Drug use: No   Sexual activity: Yes  Other Topics Concern   Not on file  Social History Narrative   [employer]. L knee pain was service related.  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     Social Determinants of Health   Financial Resource Strain: Not on file  Food Insecurity: Not on file  Transportation Needs: Not on  file  Physical Activity: Not on file  Stress: Not on file  Social Connections: Not on file   Family History  Problem Relation Age of Onset   Hyperlipidemia Mother    Diabetes Mother    Diabetes Father    Hyperlipidemia Father    Scheduled Meds:  sodium chloride   Intravenous Once   amLODipine  5 mg Oral Daily   Chlorhexidine Gluconate Cloth  6 each Topical Q0600   doxercalciferol  2 mcg Intravenous Q T,Th,Sa-HD   insulin aspart  0-6 Units Subcutaneous TID WC   Continuous Infusions:  ampicillin-sulbactam (UNASYN) IV     levETIRAcetam     levETIRAcetam 500 mg (06/29/21 1053)   PRN Meds:.acetaminophen **OR** acetaminophen, diphenhydrAMINE, LORazepam, morphine injection, ondansetron **OR** ondansetron (ZOFRAN) IV Medications Prior to Admission:  Prior to Admission medications   Medication Sig Start Date End Date Taking? Authorizing Provider  acetaminophen (TYLENOL) 325 MG tablet Take 650 mg by mouth every 4 (four) hours as needed for mild pain or  fever.   Yes [provider]  albuterol (VENTOLIN HFA) 108 (90 Base) MCG/ACT inhaler Inhale 1 puff into the lungs every 6 (six) hours as needed for wheezing or shortness of breath.   Yes [provider]  amLODipine (NORVASC) 5 MG tablet Take 5 mg by mouth daily. 03/27/21  Yes [provider]  atorvastatin (LIPITOR) 40 MG tablet Take 40 mg by mouth daily.   Yes [provider]  benzonatate (TESSALON) 100 MG capsule Take 100 mg by mouth 3 (three) times daily.   Yes [provider]  carvedilol (COREG) 25 MG tablet Take 25 mg by mouth 2 (two) times daily with a meal.   Yes [provider]  guaiFENesin (ROBITUSSIN) 100 MG/5ML SOLN Take 15 mLs by mouth every 4 (four) hours as needed for cough or to loosen phlegm.   Yes [provider]  hydrocortisone-pramoxine [facility]) 2.5-1 % rectal cream Place 1 application rectally 3 (three) times daily. 06/23/21  Yes [provider]  Hyoscyamine Sulfate SL 0.125 MG SUBL Place 1-2 tablets under the tongue See admin instructions. 1 to 2 tablets sublingually every 4 hours as needed for excessive secreations 05/25/21  Yes [provider]  lidocaine (XYLOCAINE) 5 % ointment Apply 1 application topically 2 (two) times daily as needed for moderate pain (rectal pain). 06/01/20  Yes [provider]  loratadine (CLARITIN) 10 MG tablet Take 10 mg by mouth daily.   Yes [provider]  LORazepam (ATIVAN) 0.5 MG tablet Take 0.5 mg by mouth every 4 (four) hours as needed for anxiety. 05/25/21  Yes [provider]  losartan (COZAAR) 100 MG tablet Take 100 mg by mouth daily.   Yes [provider]  melatonin 3 MG TABS tablet Take 3 mg by mouth daily as needed (sleep).   Yes [provider]  morphine (ROXANOL) 20 MG/ML concentrated solution Take 10 mg by mouth every 4 (four) hours as needed for severe pain. Every 1 hour as needed 05/25/21  Yes [provider]   NIFEdipine (ADALAT CC) 60 MG 24 hr tablet Take 60 mg by mouth daily.   Yes [provider]  ondansetron (ZOFRAN) 4 MG tablet Take 4 mg by mouth daily. 04/28/21  Yes [provider]  oxyCODONE (OXY IR/ROXICODONE) 5 MG immediate release tablet Take 5 mg by mouth every 4 (four) hours as needed for pain or severe pain. 04/28/21  Yes [provider]  pantoprazole (PROTONIX) 40 MG tablet Take  40 mg by mouth 2 (two) times daily.   Yes [provider]  sertraline (ZOLOFT) 100 MG tablet Take 100 mg by mouth daily. 02/07/21  Yes [provider]  Zanubrutinib (BRUKINSA) 80 MG CAPS Take 2 capsules by mouth 3 (three) times a week. Tues,Thur. Sat   Yes [provider]  meclizine (ANTIVERT) 25 MG tablet Take 1 tablet (25 mg total) by mouth 3 (three) times daily as needed for dizziness. Patient not taking: Reported on 06/26/2021 01/12/19   Mosetta Anis, MD  mupirocin nasal ointment (BACTROBAN NASAL) 2 % Place 1 application into the nose 2 (two) times daily. Apply to sore on nostril twice daily Patient not taking: No sig reported 06/01/21   Christine Pitcher, NP   Allergies  Allergen Reactions   Lisinopril Cough   Review of Systems  Unable to perform ROS: Mental status change   Physical Exam Vitals and nursing note reviewed.  Constitutional:      General: She is not in acute distress.    Appearance: She is ill-appearing.  Cardiovascular:     Rate and Rhythm: Normal rate.  Pulmonary:     Effort: Pulmonary effort is normal.  Neurological:     Mental Status: She is disoriented.    Vital Signs: BP (!) 154/66 (BP Location: Right Arm)   Pulse 75   Temp 98.9 F (37.2 C) (Oral)   Resp 13   Wt 40.9 kg   LMP  (LMP Unknown)   SpO2 98%   BMI 18.21 kg/m  Pain Scale: 0-10 POSS *See Group Information*: 4-INTERVENTION REQUIRED,Unacceptable,Somnolent, mininal or no response to verbal and physical stimulation Pain Score: Asleep   SpO2: SpO2: 98 % O2  Device:SpO2: 98 % O2 Flow Rate: .O2 Flow Rate (L/min): 1 L/min  IO: Intake/output summary:  Intake/Output Summary (Last 24 hours) at 06/29/2021 1537 Last data filed at 06/28/2021 1905 Gross per 24 hour  Intake --  Output 1500 ml  Net -1500 ml    LBM:   Baseline Weight: Weight: 44.5 kg Most recent weight: Weight: 40.9 kg     Palliative Assessment/Data: 20%     Time In: 12:30p Time Out: 2:30p Time Total: 120 minutes Prolonged Time: Yes Greater than 50% of this time was spent in counseling and coordinating care related to the above assessment and plan.  Dorthy Cooler, PA-C Palliative Medicine Team Team phone # [phone]  Thank you for allowing the Palliative Medicine Team to assist in the care of this patient. Please utilize secure chat with additional questions, if there is no response within 30 minutes please call the above phone number.  Palliative Medicine Team providers are available by phone from 7am to 7pm daily and can be reached through the team cell phone.  Should this patient require assistance outside of these hours, please call the patient's attending physician.

## 2021-06-29 NOTE — Progress Notes (Signed)
[facility] Progress Note  Subjective: seen in room, remains confused   Vitals:   06/29/21 1256 06/29/21 1300 06/29/21 1330 06/29/21 1400  BP: (!) 170/72 (!) 166/78 (!) 157/88 134/75  Pulse: 78 79 81 94  Resp: 14 15 14 15   Temp:      TempSrc:      SpO2:      Weight:        Exam:  Lethargic, confused, nad   no jvd  Chest cta bilat  Cor reg no RG  Abd soft ntnd no ascites   Ext no LE edema   Nonfocal, confused   L arm AVG+bruit        Home meds include norvasc, lipitor, coreg, ativan prn, losartan, roxanol prn, adalat 60 qd, oxy IR prn, protonix, zoloft, Zanubrutinib tiw po, prn's    CXR - IMPRESSION: 1. New right midlung infiltrate. Questionable new right hilar enlargement. Findings may be related to pneumonia with right hilar lymphadenopathy. Short-term follow-up two views chest and/or CT recommended to exclude underlying mass at this level.    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  HD today to get back on schedule.  Chronic COVID infection / FTT - pt was COVID + first during May 2022 admit, has been testing + repeatedly since then. COVID IgG Ab's were measured here and came back at non-reactive, despite full vaccination course. Poor prognosis as immune system can't win the fight against the virus. Palliative consult appreciated.  AMS - persistent, delirium likely, multifactorial , FTT Acute/ subactue R frontal lobe contusion: per Nsurg supportive care CLL/ chronic thrombocytopenia - f/u [facility]. Last seen in June, 2022 by [facility] and plans were to stop zanubrutinib and initiate hospice care.  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 Ca 8.3, phos 8 HTN/ volume - under dry wt slightly. No vol excess on exam, BP's up, on  home norvasc.        Rob Dalon Reichart 06/29/2021, 2:15 PM   Recent Labs  Lab 06/28/21 0315 06/28/21 1215 06/29/21 0307 06/29/21 0558  K 4.7  --   --  3.7  BUN 44*  --   --  17  CREATININE 7.79*  --   --  5.00*  CALCIUM 8.3*  --   --  7.8*  PHOS 8.1*  --   --  4.6  HGB  --  12.7 13.7  --     Inpatient medications:  sodium chloride   Intravenous Once   amLODipine  5 mg Oral Daily   Chlorhexidine Gluconate Cloth  6 each Topical Q0600   doxercalciferol  2 mcg Intravenous Q T,Th,Sa-HD   insulin aspart  0-6 Units Subcutaneous TID WC    levETIRAcetam     levETIRAcetam 500 mg (06/29/21 1053)   acetaminophen **OR** acetaminophen, diphenhydrAMINE, LORazepam, morphine injection, ondansetron **OR** ondansetron (ZOFRAN) IV

## 2021-06-29 NOTE — Progress Notes (Signed)
Pt unable to get her EEG. Pt going to Dialysis

## 2021-06-29 NOTE — Progress Notes (Signed)
EEG complete - results pending 

## 2021-06-29 NOTE — Consult Note (Signed)
Neurology Consultation  Reason for Consult: seizure like activity.   Referring Physician: Nena Alexander., MD.   CC: seizure like activity.   History is obtained from: chart.   HPI: Ms Majkowski is a 70 yo female who appears older than her stated age with a PMHx of CVA 04/2021, COVID, HTN, ESRD on HD TTS, DM II, CLL, on Hospice at home, and HLD. Stroke in July 2022 was small acute left thalamocapsular infarct. Patient was not treated with DAPT due to thombocytopenia. Patient was COVID + May 2022 and also COVID + in July 2022 and remains + on this hospitalization, apparently unable to mount an effective immune response against the virus despite vaccination and recurrent infection.   Patient was admitted here 2 days ago for aspiration PNA with low grade fever and confusion after missed HD appointments. Work up showed MRI brain with small acute to subacute right frontal hemorrhage. NSU consulted and no surgical intervention was warranted. Neurology consulted and we felt it was more of a contusion given the location. Per husband, she fell last month striking her head. Her HD was restarted on 06/27/21. Platelets 41 up from 28 (chronically low). Mg 1.7. WBCC 2.3 up yo 4.7.   Patient had a seizure 06/28/21 after HD session. Described as GTC lasting about 2 minutes. Keppra was loaded and ordered daily and after HD on HD days. No further seizure activity noted in chart. EEG was negative for seizure activity and + for encephalopathy.   In review of chart, patient was able to follow commands on admission and participate in ROS. On 06/28/21 exam, she was following some commands with perseverating speech.   Neurology asked to see for new onset seizure.   ROS: A robust ROS was unable to be performed due to patient's encephalopathy.    Past Medical History:  Diagnosis Date   Chronic kidney disease    Diabetes mellitus without complication ([place])    ESRD (end stage renal disease) ([place])    Clinician notes   History of  leukemia    Hypertension   CLL with thrombocytopenia, ESRD on HD TTS, HLD, COVID  Social History:   reports that she has never smoked. She has never used smokeless tobacco. She reports current alcohol use. She reports that she does not use drugs.  Medications  Current Facility-Administered Medications:    0.9 %  sodium chloride infusion (Manually program via Guardrails IV Fluids), , Intravenous, Once, Alcario Drought, Jared M, DO   acetaminophen (TYLENOL) tablet 650 mg, 650 mg, Oral, Q6H PRN **OR** acetaminophen (TYLENOL) suppository 650 mg, 650 mg, Rectal, Q6H PRN, Alcario Drought, Jared M, DO   amLODipine (NORVASC) tablet 5 mg, 5 mg, Oral, Daily, Ghimire, Henreitta Leber, MD, 5 mg at 06/28/21 0941   Chlorhexidine Gluconate Cloth 2 % PADS 6 each, 6 each, Topical, Q0600, Adelfa Koh, NP, 6 each at 06/29/21 5462   diphenhydrAMINE (BENADRYL) injection 25 mg, 25 mg, Intravenous, Q6H PRN, Chotiner, Yevonne Aline, MD   doxercalciferol (HECTOROL) injection 2 mcg, 2 mcg, Intravenous, Q T,Th,Sa-HD, Roney Jaffe, MD   insulin aspart (novoLOG) injection 0-6 Units, 0-6 Units, Subcutaneous, TID WC, Ghimire, Henreitta Leber, MD   levETIRAcetam (KEPPRA) 250 mg in sodium chloride 0.9 % 100 mL IVPB, 250 mg, Intravenous, Q T,Th,Sa-HD, Kerney Elbe, MD   levETIRAcetam (KEPPRA) IVPB 500 mg/100 mL premix, 500 mg, Intravenous, Daily, Ghimire, Shanker M, MD   LORazepam (ATIVAN) injection 1 mg, 1 mg, Intravenous, Q4H PRN, Chotiner, Yevonne Aline, MD   morphine 2 MG/ML  injection 1-2 mg, 1-2 mg, Intravenous, Q4H PRN, Alcario Drought, Jared M, DO   ondansetron [facility]) tablet 4 mg, 4 mg, Oral, Q6H PRN **OR** ondansetron (ZOFRAN) injection 4 mg, 4 mg, Intravenous, Q6H PRN, Etta Quill, DO  Exam: Current vital signs: BP (!) 167/66 (BP Location: Right Arm)   Pulse 81   Temp 98.8 F (37.1 C) (Oral)   Resp 18   Wt 40.9 kg   LMP  (LMP Unknown)   SpO2 97%   BMI 18.21 kg/m  Vital signs in last 24 hours: Temp:  [97.9 F (36.6 C)-99.1 F  (37.3 C)] 98.8 F (37.1 C) (09/08 0819) Pulse Rate:  [73-100] 81 (09/08 0819) Resp:  [10-25] 18 (09/08 0819) BP: (108-220)/(60-98) 167/66 (09/08 0819) SpO2:  [94 %-100 %] 97 % (09/08 0819) Weight:  [40.9 kg-44.5 kg] 40.9 kg (09/08 0500)  PE: GENERAL: Acute on chronically ill appearing female. Non toxic. Lying in bed in NAD. HEENT: - Normocephalic and atraumatic, moist mucous membranes. LUNGS - Normal respiratory effort.  CV - RRR. ABDOMEN - Soft, nontender. Ext: warm, well perfused. Psych: Confused.   NEURO:  Mental Status: Awake, alert. Does not answer orientation questions. She smiles and laughs inappropriately and perseverates on 1 word which NP does not understand. Does not follow commands.  Speech/Language: Speaks only one word. Unable to name, repeat, or speak fluently. Comprehension is impaired.  Cranial Nerves:  II: PERRL 3 mm/brisk.  III, IV, VI:  Lid elevation symmetric and full.  V, VII: Smile is symmetrical grossly.   VIII: Unsure if hearing intact to voice due to not following commands. IX, X: Phonation is normal with one word.  XI: head is grossly midline.  XII: Will not open mouth.  Motor:  Moves all extremities spontaneously. Grips with left hand, but not on command. No other participation in strength exam.  Tone is normal. Bulk is decreased.  Sensation- withdraws to noxious stimuli in 4 extremities.  Cerebellar: No tremors, twitching, jerking, or shaking noted. No clonus.  Gait: deferred  Labs as per HPI.   CBC    Component Value Date/Time   WBC 4.7 06/29/2021 0307   RBC 5.01 06/29/2021 0307   HGB 13.7 06/29/2021 0307   HCT 42.9 06/29/2021 0307   PLT 41 (L) 06/29/2021 0307   MCV 85.6 06/29/2021 0307   MCH 27.3 06/29/2021 0307   MCHC 31.9 06/29/2021 0307   RDW 17.3 (H) 06/29/2021 0307   LYMPHSABS 2.4 06/26/2021 1800   MONOABS 0.6 06/26/2021 1800   EOSABS 0.1 06/26/2021 1800   BASOSABS 0.0 06/26/2021 1800    CMP     Component Value Date/Time    NA 140 06/29/2021 0558   K 3.7 06/29/2021 0558   CL 101 06/29/2021 0558   CO2 25 06/29/2021 0558   GLUCOSE 80 06/29/2021 0558   BUN 17 06/29/2021 0558   CREATININE 5.00 (H) 06/29/2021 0558   CREATININE 4.72 (H) 01/06/2021 1615   CALCIUM 7.8 (L) 06/29/2021 0558   PROT 4.8 (L) 06/27/2021 0816   ALBUMIN 2.3 (L) 06/29/2021 0558   AST 21 06/27/2021 0816   ALT 12 06/27/2021 0816   ALKPHOS 50 06/27/2021 0816   BILITOT 0.7 06/27/2021 0816   GFRNONAA 9 (L) 06/29/2021 0558   GFRAA 12 (L) 01/12/2019 0908    Imaging  CT head -Questionable hypoattenuating area in the right thalamus/basal ganglia which may reflect an acute/subacute infarction. Consider further evaluation with MRI brain. -Otherwise similar appearance of chronic findings including prior infarctions and chronic  ischemic white matter disease. -Progressive opacification of bilateral mastoid sinuses and bilateral middle ear fluid. -Pansinusitis.  MRI brain -2 cm focus of FLAIR and T1 hyperintensity involving the anterior/inferior right frontal lobe, new as compared to recent MRI from 05/09/2021. In comparison with prior CT from 06/26/2021, there appears to be a small extra-axial hemorrhage and probable parenchymal contusion at this location. This is likely subacute in nature without significant regional mass effect or edema. Correlation with history for possible recent trauma suggested. -Appearance would be atypical and unusual for intracranial spread of infection from adjacent paranasal sinus disease, which would be the primary differential consideration.  -Short interval follow-up MRI in 2-3 months to ensure these changes resolve is suggested. If there is concern for possible intracranial infection, then correlation with dedicated LP and CSF studies could be performed as warranted. -No other acute intracranial abnormality. -Underlying chronic microvascular ischemic disease with multiple remote cerebellar and left thalamic  infarcts, stable.  EEG This study is suggestive of moderate diffuse encephalopathy, nonspecific etiology. No seizures or epileptiform discharges were seen throughout the recording.   Assessment: 70 yo female who is chronically ill with ESRD and CLL, multiple other comorbidities as listed in her PMHx, presenting 2 days ago with low grade fever and confusion. Found to have PNA and was being treated with antibiotics. She had a seizure yesterday with no previously known history of seizures. Her risk factors for seizures are prior CVA and contusion on MRIb. She was loaded with Keppra and is on renally dosed Keppra daily and after HD on HD days.  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  -She is more encephalopathic on today's exam than previously charted which may be due to yesterday's seizure.  -Given that her EEG was performed prior to yesterday's seizure, will check another rEEG today.    Impression: -New onset seizure.  -Brain contusion query head trauma last month.  -Prior CVA 04/2021.  -Multifactorial moderate to severe encephalopathy.  -On Hospice at home.   Recommendations: -r/p rEEG.  -Agree with palliative care meeting.  -Check B12, TSH. -Supplement Vit B12 if B12 level is < 500.  -Renal dosing of Keppra: Continue Keppra 500mg  IV qd and 250mg  IV after each HD session.  -seizure precautions.  -Ativan 1mg  IV prn seizure lasting over 5 minutes.  -Avoid medications on Beer's list for the elderly.  -If mental status does not improve, consider changing Cefepime to another antibiotic due to potential for Cefepime toxicity.  -Appropriate PPE worn for COVID isolation.    Pt seen by Clance Boll, NP/Neuro  Pager: [phone]  I have seen and examined the patient. I have discussed the assessment  and recommendations with the Neurohospitalist NP and made amendations above as needed. 70 yo female who is chronically ill with ESRD and CLL, multiple other comorbidities as listed in her PMHx, presenting 2 days ago with low grade fever and confusion. Found to have PNA and was being treated with antibiotics. She had a seizure yesterday with no previously known history of seizures. Her risk factors for seizures are prior CVA and contusion on MRIb. Exam reveals an encephalopathic, frail appearing elderly female without focal weakness. She has been loaded with Keppra and is on renally dosed Keppra daily and after HD on HD days. Other recommendations as above.  Electronically signed: Dr. Kerney Elbe

## 2021-06-29 NOTE — Progress Notes (Signed)
Patient returned from HD at 1715, HD RN indicated 1 L was taken off, patient lethargic, BP post HD 141/99. 250 mg Keppra IV ordered from pharmacy.

## 2021-06-29 NOTE — Progress Notes (Addendum)
PROGRESS NOTE        PATIENT DETAILS Name: Christine Gibson Age: 70 y.o. Sex: female Date of Birth: 09/09/1951 Admit Date: 06/26/2021 Admitting Physician Etta Quill, DO NWG:NFAOZ, Alyson Locket, NP  Brief Narrative: Patient is a 70 y.o. female ESRD on HD TTS, CLL, DM-2, HTN, HLD, recent CVA in July-presented to the hospital with confusion and low-grade fever.  Significant events: 9/5>> admit-for evaluation of confusion-MRI brain with acute/subacute right frontal hemorrhage/contusion. 9/7>>  witnessed seizure by nursing staff  Significant studies: 9/5>> CXR: Right midlung infiltrate. 9/6>> MRI brain: Anterior/inferior right frontal lobe-likely contusion. 9/6>> EEG: No seizures-moderate diffuse encephalopathy 9/6>> SARS-COV-2 antibody IgG: Not detected.  Antimicrobial therapy: Vancomycin: 9/5 x 1 Cefepime: 9/5>> 9/7 Zithromax: 9/5>> 9/7 Unasyn: 9/8>>  Microbiology data: 5/25>> COVID screening test positive at outside facility (per prior records) 7/19>> COVID PCR: Positive (CT value 24.4) 9/05>> COVID PCR: Positive (CT value 20.4) 9/05>> blood culture: 1/2 Gram-positive cocci (likely coag negative staph)  Procedures : None  Consults: Renal Neurology Neurosurgery Palliative care  DVT Prophylaxis : SCDs Start: 06/26/21 2226   Subjective: Lying comfortably in bed-confused-keeps on repeating the same words at times.  At times answers simple questions.   Assessment/Plan: Acute metabolic encephalopathy: Mental status seems to wax and wane-she seems to be perseverating at times.  Encephalopathy felt to be from infection/pneumonia/frontal lobe contusion/recent stroke/possible seizures.  Her overall prognosis is poor-palliative care is engaging with family.    Acute/subacute right frontal lobe contusion: Recommendations from neurosurgery are to continue supportive care.  From my conversation with patient's spouse apparently patient did fall and hit  her head sometime in early August.    New onset seizures: Likely due to recent CVA/frontal lobe contusion seen on MRI.  Neurology consulted-started on Keppra.  Aspiration pneumonia: Continue empiric antimicrobial therapy.  Her CT/chest x-ray imaging is more consistent with aspiration pneumonia rather than COVID-19 pneumonia.  We will stop cefepime as it lowers seizure threshold-start Unasyn instead.  Appreciate SLP evaluation.  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  Unclear whether she was still taking zanubrutinib prior to this hospitalization as well.  She received a monoclonal antibody infusion on 9/6.  Recent Labs    06/26/21 2000 06/28/21 0108  DDIMER 0.80*  --   CRP 9.5* 8.6*    ESRD: On HD TTS-nephrology following for HD needs.  Chronic lymphocytic leukemia with anemia/thrombocytopenia: She follows with oncology at [facility] last oncology note on 04/07/2021-plans were to stop zanubrutinib and initiate hospice care.  I touched base with the patient's husband on 9/7-explained what patient's primary oncologist had recommended-he is aware of poor overall prognosis-and the likelihood that the patient will continue to deteriorate.  Palliative care meeting with family today-we will await further recommendations.   Anemia: Likely multifactorial due to CLL and ESRD.  Transfused 1 unit of PRBC on 9/7.  Follow CBC.  Weekly.  Thrombocytopenia: Likely due to CLL-platelet count fluctuating-reviewed last oncology note on 6/17-recommendations were to stop zanubrutinib-and initiate hospice care.  Discussed briefly with patient's husband-palliative care consulted.  Not sure if any further platelet transfusion is going to be beneficial given that  she appears to have advanced CLL (transfused 1 unit of pheresis platelets on 9/6).  Follow CBC periodically.  CVA: Hold antiplatelets due to hemorrhage/contusion seen on MRI brain.  HTN: BP reasonable-continue amlodipine-should improve further with HD.  Coreg/losartan on hold.    DM-2 (A1c 6.0): Diet controlled at home-start SSI and follow  Hard of hearing  Palliative care: Long discussion with patient's spouse over the phone on 9/7-I went over progress note written by oncology at [facility] on 6/17-he acknowledges significant cognitive/physical decline in patient since her last appointment with oncology.  Explained that patient likely will continue to deteriorate over the next few weeks.  Overall prognosis is very poor-palliative care meeting with family on 9/8-we will await further recommendations.   Diet: Diet Order             DIET DYS 2 Room service appropriate? Yes; Fluid consistency: Thin  Diet effective now                    Code Status: Full code   Family Communication: Spoke with Spouse [name]-[phone]-over phone on 9/6  Disposition Plan: Status is: Inpatient  Remains inpatient appropriate because:Inpatient level of care appropriate due to severity of illness  Dispo: The patient is from: Home              Anticipated d/c is to: Home              Patient currently is not medically stable to d/c.   Difficult to place patient No    Barriers to Discharge: Encephalopathic-recurrent COVID-19 infection-not yet stable for discharge-see above documentation.  Antimicrobial agents: Anti-infectives (From admission, onward)    Start     Dose/Rate Route Frequency Ordered Stop   06/27/21 2100  ceFEPIme (MAXIPIME) 1 g in sodium chloride 0.9 % 100 mL IVPB  Status:  Discontinued        1 g 200 mL/hr over 30 Minutes Intravenous Every 24 hours 06/26/21 1949 06/29/21 0749   06/26/21 2230  azithromycin (ZITHROMAX) 500 mg in sodium chloride 0.9 % 250 mL IVPB  Status:   Discontinued        500 mg 250 mL/hr over 60 Minutes Intravenous Every 24 hours 06/26/21 2221 06/29/21 0749   06/26/21 1930  vancomycin (VANCOCIN) IVPB 1000 mg/200 mL premix        1,000 mg 200 mL/hr over 60 Minutes Intravenous  Once 06/26/21 1846 06/26/21 2052   06/26/21 1900  ceFEPIme (MAXIPIME) 2 g in sodium chloride 0.9 % 100 mL IVPB        2 g 200 mL/hr over 30 Minutes Intravenous  Once 06/26/21 1846 06/26/21 1937   06/26/21 1845  vancomycin variable dose per unstable renal function (pharmacist dosing)  Status:  Discontinued         Does not apply See admin instructions 06/26/21 1846 06/26/21 2221        Time spent: 35 minutes-Greater than 50% of this time was spent in counseling, explanation of diagnosis, planning of further management, and coordination of care.  MEDICATIONS: Scheduled Meds:  sodium chloride   Intravenous Once   amLODipine  5 mg Oral Daily   Chlorhexidine Gluconate Cloth  6 each Topical Q0600   doxercalciferol  2 mcg Intravenous Q T,Th,Sa-HD   insulin aspart  0-6 Units Subcutaneous TID WC   Continuous Infusions:  levETIRAcetam     levETIRAcetam 500 mg (06/29/21 1053)   PRN Meds:.acetaminophen **OR** acetaminophen, diphenhydrAMINE, LORazepam, morphine injection, ondansetron **OR** ondansetron (  ZOFRAN) IV   PHYSICAL EXAM: Vital signs: Vitals:   06/29/21 1256 06/29/21 1300 06/29/21 1330 06/29/21 1400  BP: (!) 170/72 (!) 166/78 (!) 157/88 134/75  Pulse: 78 79 81 94  Resp: 14 15 14 15   Temp:      TempSrc:      SpO2:      Weight:       Filed Weights   06/28/21 1605 06/28/21 1905 06/29/21 0500  Weight: 44.5 kg 43 kg 40.9 kg   Body mass index is 18.21 kg/m.   Gen Exam:-not in any distress HEENT:atraumatic, normocephalic Chest: B/L clear to auscultation anteriorly CVS:S1S2 regular Abdomen:soft non tender, non distended Extremities:no edema Neurology: Seems to be moving all 4 extremities-remains very confused. Skin: no rash   I have  personally reviewed following labs and imaging studies  LABORATORY DATA: CBC: Recent Labs  Lab 06/26/21 1800 06/27/21 0816 06/27/21 1317 06/28/21 0108 06/28/21 1215 06/29/21 0307  WBC 4.0 4.3 3.8* 2.3*  --  4.7  NEUTROABS 0.9*  --   --   --   --   --   HGB 10.6* 10.6* 9.9* 7.0* 12.7 13.7  HCT 35.5* 34.8* 32.9* 22.9* 40.4 42.9  MCV 91.3 89.7 89.9 90.5  --  85.6  PLT 42* 47* 43* 28*  --  41*     Basic Metabolic Panel: Recent Labs  Lab 06/26/21 1800 06/27/21 0816 06/28/21 0315 06/29/21 0558  NA 142 143 143 140  K 4.5 4.6 4.7 3.7  CL 103 105 103 101  CO2 25 24 24 25   GLUCOSE 134* 91 83 80  BUN 35* 40* 44* 17  CREATININE 6.50* 7.10* 7.79* 5.00*  CALCIUM 8.3* 8.3* 8.3* 7.8*  MG  --   --   --  1.7  PHOS  --   --  8.1* 4.6     GFR: Estimated Creatinine Clearance: 6.7 mL/min (A) (by C-G formula based on SCr of 5 mg/dL (H)).  Liver Function Tests: Recent Labs  Lab 06/26/21 1800 06/27/21 0816 06/28/21 0315 06/29/21 0558  AST 26 21  --   --   ALT 12 12  --   --   ALKPHOS 53 50  --   --   BILITOT 0.5 0.7  --   --   PROT 4.6* 4.8*  --   --   ALBUMIN 2.4* 2.4* 2.3* 2.3*    No results for input(s): LIPASE, AMYLASE in the last 168 hours. Recent Labs  Lab 06/26/21 2204  AMMONIA 34     Coagulation Profile: Recent Labs  Lab 06/26/21 1800  INR 1.2     Cardiac Enzymes: No results for input(s): CKTOTAL, CKMB, CKMBINDEX, TROPONINI in the last 168 hours.  BNP (last 3 results) No results for input(s): PROBNP in the last 8760 hours.  Lipid Profile: No results for input(s): CHOL, HDL, LDLCALC, TRIG, CHOLHDL, LDLDIRECT in the last 72 hours.  Thyroid Function Tests: Recent Labs    06/29/21 1049  TSH 2.400    Anemia Panel: Recent Labs    06/29/21 1049  VITAMINB12 3,204*    Urine analysis:    Component Value Date/Time   COLORURINE STRAW (A) 01/12/2019 1130   APPEARANCEUR CLEAR 01/12/2019 1130   LABSPEC 1.007 01/12/2019 1130   PHURINE 7.0  01/12/2019 1130   GLUCOSEU 150 (A) 01/12/2019 1130   HGBUR NEGATIVE 01/12/2019 1130   BILIRUBINUR NEGATIVE 01/12/2019 1130   KETONESUR NEGATIVE 01/12/2019 1130   PROTEINUR >=300 (A) 01/12/2019 1130   NITRITE NEGATIVE 01/12/2019  [place] 01/12/2019 1130    Sepsis Labs: Lactic Acid, Venous    Component Value Date/Time   LATICACIDVEN 1.1 06/26/2021 2204    MICROBIOLOGY: Recent Results (from the past 240 hour(s))  Blood Culture (routine x 2)     Status: Abnormal (Preliminary result)   Collection Time: 06/26/21  5:26 PM   Specimen: BLOOD RIGHT HAND  Result Value Ref Range Status   Specimen Description BLOOD RIGHT HAND  Final   Special Requests   Final    BOTTLES DRAWN AEROBIC ONLY Blood Culture results may not be optimal due to an inadequate volume of blood received in culture bottles   Culture  Setup Time   Final    GRAM POSITIVE COCCI AEROBIC BOTTLE ONLY CRITICAL RESULT CALLED TO, READ BACK BY AND VERIFIED WITH: PHARMD A LAWLESS [id number] AT 27 AM BY CM    Culture (A)  Final    STAPHYLOCOCCUS EPIDERMIDIS THE SIGNIFICANCE OF ISOLATING THIS ORGANISM FROM A SINGLE SET OF BLOOD CULTURES WHEN MULTIPLE SETS ARE DRAWN IS UNCERTAIN. PLEASE NOTIFY THE MICROBIOLOGY DEPARTMENT WITHIN ONE WEEK IF SPECIATION AND SENSITIVITIES ARE REQUIRED. Performed at [facility], [place] [address] [place], [place] [address]    Report Status PENDING  Incomplete  Blood Culture ID Panel (Reflexed)     Status: Abnormal   Collection Time: 06/26/21  5:26 PM  Result Value Ref Range Status   Enterococcus faecalis NOT DETECTED NOT DETECTED Final   Enterococcus Faecium NOT DETECTED NOT DETECTED Final   Listeria monocytogenes NOT DETECTED NOT DETECTED Final   Staphylococcus species DETECTED (A) NOT DETECTED Final    Comment: CRITICAL RESULT CALLED TO, READ BACK BY AND VERIFIED WITH: PHARMD A LAWLESS [id number] AT 855 AM BY CM    Staphylococcus aureus (BCID) NOT DETECTED NOT DETECTED Final    Staphylococcus epidermidis DETECTED (A) NOT DETECTED Final    Comment: Methicillin (oxacillin) resistant coagulase negative staphylococcus. Possible blood culture contaminant (unless isolated from more than one blood culture draw or clinical case suggests pathogenicity). No antibiotic treatment is indicated for blood  culture contaminants. CRITICAL RESULT CALLED TO, READ BACK BY AND VERIFIED WITH: PHARMD A LAWLESS [id number] AT 54 AM BY CM    Staphylococcus lugdunensis NOT DETECTED NOT DETECTED Final   Streptococcus species NOT DETECTED NOT DETECTED Final   Streptococcus agalactiae NOT DETECTED NOT DETECTED Final   Streptococcus pneumoniae NOT DETECTED NOT DETECTED Final   Streptococcus pyogenes NOT DETECTED NOT DETECTED Final   A.calcoaceticus-baumannii NOT DETECTED NOT DETECTED Final   Bacteroides fragilis NOT DETECTED NOT DETECTED Final   Enterobacterales NOT DETECTED NOT DETECTED Final   Enterobacter cloacae complex NOT DETECTED NOT DETECTED Final   Escherichia coli NOT DETECTED NOT DETECTED Final   Klebsiella aerogenes NOT DETECTED NOT DETECTED Final   Klebsiella oxytoca NOT DETECTED NOT DETECTED Final   Klebsiella pneumoniae NOT DETECTED NOT DETECTED Final   Proteus species NOT DETECTED NOT DETECTED Final   Salmonella species NOT DETECTED NOT DETECTED Final   Serratia marcescens NOT DETECTED NOT DETECTED Final   Haemophilus influenzae NOT DETECTED NOT DETECTED Final   Neisseria meningitidis NOT DETECTED NOT DETECTED Final   Pseudomonas aeruginosa NOT DETECTED NOT DETECTED Final   Stenotrophomonas maltophilia NOT DETECTED NOT DETECTED Final   Candida albicans NOT DETECTED NOT DETECTED Final   Candida auris NOT DETECTED NOT DETECTED Final   Candida glabrata NOT DETECTED NOT DETECTED Final   Candida krusei NOT DETECTED NOT DETECTED Final   Candida parapsilosis  NOT DETECTED NOT DETECTED Final   Candida tropicalis NOT DETECTED NOT DETECTED Final   Cryptococcus neoformans/gattii NOT  DETECTED NOT DETECTED Final   Methicillin resistance mecA/C DETECTED (A) NOT DETECTED Final    Comment: CRITICAL RESULT CALLED TO, READ BACK BY AND VERIFIED WITH: PHARMD A LAWLESS [id number] AT 855 AM BY CM Performed at [facility], [place] [address], [place], [place] [address]   Blood Culture (routine x 2)     Status: None (Preliminary result)   Collection Time: 06/26/21  5:31 PM   Specimen: BLOOD  Result Value Ref Range Status   Specimen Description BLOOD RIGHT ANTECUBITAL  Final   Special Requests   Final    BOTTLES DRAWN AEROBIC AND ANAEROBIC Blood Culture results may not be optimal due to an inadequate volume of blood received in culture bottles   Culture   Final    NO GROWTH 3 DAYS Performed at [facility], [place] [address], [place], [place] [address]    Report Status PENDING  Incomplete  Resp Panel by RT-PCR (Flu A&B, Covid) Nasopharyngeal Swab     Status: Abnormal   Collection Time: 06/26/21  6:10 PM   Specimen: Nasopharyngeal Swab; Nasopharyngeal(NP) swabs in vial transport medium  Result Value Ref Range Status   SARS Coronavirus 2 by RT PCR POSITIVE (A) NEGATIVE Final    Comment: RESULT CALLED TO, READ BACK BY AND VERIFIED WITH: MEGAN RUGGIEO 06/26/21 @1935   BY JW (NOTE) SARS-CoV-2 target nucleic acids are DETECTED.  The SARS-CoV-2 RNA is generally detectable in upper respiratory specimens during the acute phase of infection. Positive results are indicative of the presence of the identified virus, but do not rule out bacterial infection or co-infection with other pathogens not detected by the test. Clinical correlation with patient history and other diagnostic information is necessary to determine patient infection status. The expected result is Negative.  Fact Sheet for Patients: EntrepreneurPulse.com.au  Fact Sheet for Healthcare Providers: IncredibleEmployment.be  This test is not yet approved or cleared by the [place]  FDA and  has been authorized for detection and/or diagnosis of SARS-CoV-2 by FDA under an Emergency Use Authorization (EUA).  This EUA will remain in effect (meaning this test can be  used) for the duration of  the COVID-19 declaration under Section 564(b)(1) of the Act, 21 U.S.C. section 360bbb-3(b)(1), unless the authorization is terminated or revoked sooner.     Influenza A by PCR NEGATIVE NEGATIVE Final   Influenza B by PCR NEGATIVE NEGATIVE Final    Comment: (NOTE) The Xpert Xpress SARS-CoV-2/FLU/RSV plus assay is intended as an aid in the diagnosis of influenza from Nasopharyngeal swab specimens and should not be used as a sole basis for treatment. Nasal washings and aspirates are unacceptable for Xpert Xpress SARS-CoV-2/FLU/RSV testing.  Fact Sheet for Patients: EntrepreneurPulse.com.au  Fact Sheet for Healthcare Providers: IncredibleEmployment.be  This test is not yet approved or cleared by the [place] FDA and has been authorized for detection and/or diagnosis of SARS-CoV-2 by FDA under an Emergency Use Authorization (EUA). This EUA will remain in effect (meaning this test can be used) for the duration of the COVID-19 declaration under Section 564(b)(1) of the Act, 21 U.S.C. section 360bbb-3(b)(1), unless the authorization is terminated or revoked.  Performed at [facility], [place] [address], [place],  [address]   Culture, blood (routine x 2)     Status: None (Preliminary result)   Collection Time: 06/28/21 12:00 PM   Specimen: BLOOD  RIGHT HAND  Result Value Ref Range Status   Specimen Description BLOOD RIGHT HAND  Final   Special Requests   Final    BOTTLES DRAWN AEROBIC AND ANAEROBIC Blood Culture adequate volume   Culture   Final    NO GROWTH < 24 HOURS Performed at [facility], [address] [place], [place] [address]    Report Status PENDING  Incomplete  Culture, blood (routine x 2)     Status:  None (Preliminary result)   Collection Time: 06/28/21 12:12 PM   Specimen: BLOOD RIGHT WRIST  Result Value Ref Range Status   Specimen Description BLOOD RIGHT WRIST  Final   Special Requests   Final    BOTTLES DRAWN AEROBIC AND ANAEROBIC Blood Culture adequate volume   Culture   Final    NO GROWTH < 24 HOURS Performed at [facility], [place] [address] [place],  [address]    Report Status PENDING  Incomplete    RADIOLOGY STUDIES/RESULTS: CT CHEST WO CONTRAST  Result Date: 06/27/2021 CLINICAL DATA:  Question right hilar infiltrate on chest radiograph EXAM: CT CHEST WITHOUT CONTRAST TECHNIQUE: Multidetector CT imaging of the chest was performed following the standard protocol without IV contrast. COMPARISON:  Chest radiograph 06/26/2021 FINDINGS: Cardiovascular: The heart is at the upper limits of normal for size. There is trace pericardial fluid. There are aortic valve calcifications and coronary artery calcifications. There is calcified atherosclerotic plaque of the thoracic aorta. Mediastinum/Nodes: The thyroid is unremarkable. The esophagus is not well assessed. There is no bulky mediastinal lymphadenopathy. There are a few prominent right axillary lymph nodes measuring up to 7 mm, nonspecific. There is fullness in the left suprahilar region, incompletely characterized in the absence of intravenous contrast but favored to reflect a vascular structure. Lungs/Pleura: The trachea is patent. There is occlusion of the left lower lobe bronchus probably by mucoid secretions. There is complete postobstructive atelectasis of the left lower lobe. There is also near complete collapse of the right lower lobe with patchy opacities in the aerated portion of the lateral segment. There are patchy ground-glass opacities in the right apex As above, there is fullness in the left suprahilar region (3-18). There is no pleural effusion or pneumothorax. Upper Abdomen: The kidneys are atrophic. The spleen is  enlarged measuring up to 13.8 cm AP. Musculoskeletal: There is slight deformity of the left eleventh rib consistent with prior trauma. There is no acute osseous abnormality. IMPRESSION: 1. Debris in the left lower lobe bronchus with complete collapse of the left lower lobe, and near complete collapse of the right middle lobe likely due to aspiration with mucous plugging. 2. Patchy opacities in the aerated right middle lobe and right apex also favored to reflect aspiration. 3. Fullness in the left suprahilar region is favored to reflect vasculature; however, lymphadenopathy can not be excluded in the absence of intravenous contrast. 4. Recommend follow-up CT chest with intravenous contrast (if not contraindicated) in 1-3 months to assess for resolution of the above. 5. Splenomegaly. 6. Trace pericardial effusion. 7. Extensive coronary artery calcifications and aortic Atherosclerosis (ICD10-I70.0). Electronically Signed   By: Valetta Mole M.D.   On: 06/27/2021 16:06   EEG adult  Result Date: 06/28/2021 Lora Havens, MD     06/28/2021 11:04 AM Patient Name: BRAE SCHAAFSMA MRN: [id number] Epilepsy Attending: Lora Havens Referring Physician/Provider: Dr Oren Binet Date: 06/27/2021 Duration: 24.19 mins Patient history: 70 year old female with acute/subacute right frontal lobe contusion and altered mental  status.  EEG evaluate for seizures. Level of alertness: Awake AEDs during EEG study: None Technical aspects: This EEG study was done with scalp electrodes positioned according to the 10-20 International system of electrode placement. Electrical activity was acquired at a sampling rate of 500Hz  and reviewed with a high frequency filter of 70Hz  and a low frequency filter of 1Hz . EEG data were recorded continuously and digitally stored. Description: No clear posterior dominant rhythm was seen.  EEG showed continuous generalized 3 to 5 Hz theta and delta slowing. Hyperventilation and photic stimulation were not  performed.   ABNORMALITY - Continuous slow, generalized IMPRESSION: This study is suggestive of moderate diffuse encephalopathy, nonspecific etiology. No seizures or epileptiform discharges were seen throughout the recording. [place]     LOS: 3 days   Oren Binet, MD  [facility]    To contact the attending provider between 7A-7P or the covering provider during after hours 7P-7A, please log into the web site [email or web address] and access using universal [place] password for that web site. If you do not have the password, please call the hospital operator.  06/29/2021, 2:09 PM

## 2021-06-30 ENCOUNTER — Inpatient Hospital Stay (HOSPITAL_COMMUNITY): Payer: Medicare Other

## 2021-06-30 DIAGNOSIS — J189 Pneumonia, unspecified organism: Secondary | ICD-10-CM | POA: Diagnosis not present

## 2021-06-30 DIAGNOSIS — R4182 Altered mental status, unspecified: Secondary | ICD-10-CM | POA: Diagnosis not present

## 2021-06-30 DIAGNOSIS — Z515 Encounter for palliative care: Secondary | ICD-10-CM

## 2021-06-30 DIAGNOSIS — I639 Cerebral infarction, unspecified: Secondary | ICD-10-CM | POA: Diagnosis not present

## 2021-06-30 DIAGNOSIS — N186 End stage renal disease: Secondary | ICD-10-CM | POA: Diagnosis not present

## 2021-06-30 DIAGNOSIS — Z992 Dependence on renal dialysis: Secondary | ICD-10-CM | POA: Diagnosis not present

## 2021-06-30 DIAGNOSIS — Z7189 Other specified counseling: Secondary | ICD-10-CM

## 2021-06-30 DIAGNOSIS — G9340 Encephalopathy, unspecified: Secondary | ICD-10-CM

## 2021-06-30 DIAGNOSIS — C9110 Chronic lymphocytic leukemia of B-cell type not having achieved remission: Secondary | ICD-10-CM

## 2021-06-30 DIAGNOSIS — U071 COVID-19: Secondary | ICD-10-CM | POA: Diagnosis not present

## 2021-06-30 DIAGNOSIS — I120 Hypertensive chronic kidney disease with stage 5 chronic kidney disease or end stage renal disease: Secondary | ICD-10-CM | POA: Diagnosis not present

## 2021-06-30 DIAGNOSIS — N250 Renal osteodystrophy: Secondary | ICD-10-CM | POA: Diagnosis not present

## 2021-06-30 DIAGNOSIS — R569 Unspecified convulsions: Secondary | ICD-10-CM | POA: Diagnosis not present

## 2021-06-30 DIAGNOSIS — R531 Weakness: Secondary | ICD-10-CM | POA: Diagnosis not present

## 2021-06-30 DIAGNOSIS — D631 Anemia in chronic kidney disease: Secondary | ICD-10-CM | POA: Diagnosis not present

## 2021-06-30 LAB — CULTURE, BLOOD (ROUTINE X 2)

## 2021-06-30 LAB — HEPATITIS B SURFACE ANTIBODY, QUANTITATIVE: Hep B S AB Quant (Post): 43.5 m[IU]/mL (ref >9.9)

## 2021-06-30 LAB — GLUCOSE, CAPILLARY
Glucose-Capillary: 76 mg/dL (ref 70–99)
Glucose-Capillary: 72 mg/dL (ref 70–99)
Glucose-Capillary: 82 mg/dL (ref 70–99)
Glucose-Capillary: 77 mg/dL (ref 70–99)
Glucose-Capillary: 84 mg/dL (ref 70–99)

## 2021-06-30 MED ORDER — HYDRALAZINE HCL 20 MG/ML IJ SOLN
10.0000 mg | Freq: Four times a day (QID) | INTRAMUSCULAR | Status: DC | PRN
  Administered 2021-06-30: 10 mg via INTRAVENOUS
  Filled 2021-06-30 (×2): qty 1

## 2021-06-30 MED ORDER — AMLODIPINE BESYLATE 10 MG PO TABS
10.0000 mg | ORAL_TABLET | Freq: Every day | ORAL | Status: DC
  Administered 2021-07-01 – 2021-07-20 (×16): 10 mg via ORAL
  Filled 2021-06-30 (×18): qty 1

## 2021-06-30 MED ORDER — CARVEDILOL 12.5 MG PO TABS
12.5000 mg | ORAL_TABLET | Freq: Two times a day (BID) | ORAL | Status: DC
  Administered 2021-06-30 – 2021-07-20 (×29): 12.5 mg via ORAL
  Filled 2021-06-30 (×32): qty 1

## 2021-06-30 NOTE — Progress Notes (Signed)
PROGRESS NOTE        PATIENT DETAILS Name: Christine Gibson Age: 70 y.o. Sex: female Date of Birth: May 29, 1951 Admit Date: 06/26/2021 Admitting Physician Etta Quill, DO YTK:ZSWFU, Alyson Locket, NP  Brief Narrative: Patient is a 70 y.o. female ESRD on HD TTS, CLL, DM-2, HTN, HLD, recent CVA in July-presented to the hospital with confusion and low-grade fever.  Significant events: 9/5>> admit-for evaluation of confusion-MRI brain with acute/subacute right frontal hemorrhage/contusion. 9/7>>  witnessed seizure by nursing staff  Significant studies: 9/5>> CXR: Right midlung infiltrate. 9/6>> MRI brain: Anterior/inferior right frontal lobe-likely contusion. 9/6>> EEG: No seizures-moderate diffuse encephalopathy 9/6>> Serum SARS-COV-2 antibody IgG: Not detected. 9/8>> EEG: Generalized periodic discharges-ictal-interictal continuum with high potential for seizures  Antimicrobial therapy: Vancomycin: 9/5 x 1 Cefepime: 9/5>> 9/7 Zithromax: 9/5>> 9/7 Unasyn: 9/8>>  Microbiology data: 5/25>> COVID screening test positive at outside facility (per prior records) 7/19>> COVID PCR: Positive (CT value 24.4) 9/05>> COVID PCR: Positive (CT value 20.4) 9/05>> blood culture: 1/2 staff epidermidis (likely a contaminant)  9/07>> blood culture: No growth   Procedures : None  Consults: Renal Neurology Neurosurgery Palliative care  DVT Prophylaxis : SCDs Start: 06/26/21 2226   Subjective: Remains confused-repeating same words over and over again at times..  Per nursing staff-hardly any oral intake.  Assessment/Plan: Acute metabolic encephalopathy: Mental status continues to wax and-but mostly confused-perseverating at times.  Multifactorial etiology-from recent CVA/contusion/seizures/COVID-19 infectio/aspiration pneumonia.  Her prognosis is very poor-palliative care following-and engaging with family.    Acute/subacute right frontal lobe contusion:  Recommendations from neurosurgery are to continue supportive care.  From my conversation with patient's spouse apparently patient did fall and hit her head sometime in early August.    New onset seizures: Likely due to recent CVA/frontal lobe contusion seen on MRI.  Started on Keppra after discussion with neurology on 9/8.  Repeat EEG on 9/8 with abnormal waves that places the patient at high risk for seizures-neurology planning LTM EEG.  Aspiration pneumonia: Continue empiric antimicrobial therapy.  Her CT/chest x-ray imaging is more consistent with aspiration pneumonia rather than COVID-19 pneumonia.  No longer on cefepime (due to concern that it may reduce seizure threshold and contribute to encephalopathy)-switched to Unasyn instead.  Appreciate SLP evaluation.   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  Review of records from [facility] that she received a dose of Evusheld in January 2022.  Unclear whether she was still taking zanubrutinib prior to this hospitalization as well.  She received a monoclonal antibody infusion on 9/6.  Recent Labs    06/28/21 0108  CRP 8.6*    ESRD: On HD TTS-nephrology following for HD needs.  Chronic lymphocytic leukemia with anemia/thrombocytopenia: She follows with oncology at [facility] last oncology note on 04/07/2021-plans were to stop zanubrutinib and initiate hospice care.   Anemia: Likely multifactorial due to CLL and ESRD.  Transfused 1 unit of PRBC on 9/7.  Follow CBC.    Thrombocytopenia: Likely due to CLL-platelet count fluctuating-  Not sure if any further platelet transfusion is going to be beneficial given that she appears to have advanced CLL (transfused 1  unit of pheresis  platelets on 9/6).  Follow CBC periodically.  CVA: Hold antiplatelets due to hemorrhage/contusion seen on MRI brain.  HTN: BP reasonable-continue amlodipine-should improve further with HD.  Coreg/losartan on hold.    DM-2 (A1c 6.0): Diet controlled at home-start SSI and follow  Hard of hearing  Palliative care: Long discussion with patient's spouse over the phone on 9/9-he had a long meeting with palliative care and Dr. Jonnie Finner on 9/8.  He seems to be very understanding that patient will likely continue to deteriorate-and acknowledges that she may be suffering.  After extensive discussion-he was agreeable to a DNR order.  I have asked him to consider stopping HD and perhaps transition to full comfort measures/residential hospice.  Diet: Diet Order             DIET DYS 2 Room service appropriate? No; Fluid consistency: Thin  Diet effective now                    Code Status: Full code   Family Communication: Spoke with Spouse [name]-[phone]-over phone on 9/9  Disposition Plan: Status is: Inpatient  Remains inpatient appropriate because:Inpatient level of care appropriate due to severity of illness  Dispo: The patient is from: Home              Anticipated d/c is to: Home              Patient currently is not medically stable to d/c.   Difficult to place patient No    Barriers to Discharge: Encephalopathic-recurrent COVID-19 infection-not yet stable for discharge-see above documentation.  Antimicrobial agents: Anti-infectives (From admission, onward)    Start     Dose/Rate Route Frequency Ordered Stop   06/29/21 2200  Ampicillin-Sulbactam (UNASYN) 3 g in sodium chloride 0.9 % 100 mL IVPB        3 g 200 mL/hr over 30 Minutes Intravenous Every 24 hours 06/29/21 1424     06/27/21 2100  ceFEPIme (MAXIPIME) 1 g in sodium chloride 0.9 % 100 mL IVPB  Status:  Discontinued        1 g 200 mL/hr over 30 Minutes Intravenous Every 24 hours 06/26/21 1949 06/29/21  0749   06/26/21 2230  azithromycin (ZITHROMAX) 500 mg in sodium chloride 0.9 % 250 mL IVPB  Status:  Discontinued        500 mg 250 mL/hr over 60 Minutes Intravenous Every 24 hours 06/26/21 2221 06/29/21 0749   06/26/21 1930  vancomycin (VANCOCIN) IVPB 1000 mg/200 mL premix        1,000 mg 200 mL/hr over 60 Minutes Intravenous  Once 06/26/21 1846 06/26/21 2052   06/26/21 1900  ceFEPIme (MAXIPIME) 2 g in sodium chloride 0.9 % 100 mL IVPB        2 g 200 mL/hr over 30 Minutes Intravenous  Once 06/26/21 1846 06/26/21 1937   06/26/21 1845  vancomycin variable dose per unstable renal function (pharmacist dosing)  Status:  Discontinued         Does not apply See admin instructions 06/26/21 1846 06/26/21 2221        Time spent: 35 minutes-Greater than 50% of this time was spent in counseling, explanation of diagnosis, planning of further management, and coordination of care.  MEDICATIONS: Scheduled Meds:  sodium chloride   Intravenous Once   amLODipine  5 mg Oral Daily   Chlorhexidine Gluconate Cloth  6 each Topical Q0600   doxercalciferol  2 mcg Intravenous Q T,Th,Sa-HD  insulin aspart  0-6 Units Subcutaneous TID WC   Continuous Infusions:  ampicillin-sulbactam (UNASYN) IV Stopped (06/30/21 0705)   levETIRAcetam Stopped (06/29/21 1838)   levETIRAcetam Stopped (06/30/21 1001)   PRN Meds:.acetaminophen **OR** acetaminophen, diphenhydrAMINE, LORazepam, morphine injection, ondansetron **OR** ondansetron (ZOFRAN) IV   PHYSICAL EXAM: Vital signs: Vitals:   06/30/21 0400 06/30/21 0500 06/30/21 0806 06/30/21 1306  BP: (!) 173/74  (!) 156/67 (!) 184/81  Pulse: 86  92 83  Resp: 20  18 18   Temp: 98.9 F (37.2 C)  97.6 F (36.4 C) 98.7 F (37.1 C)  TempSrc: Axillary  Oral Oral  SpO2: 97%  93% 96%  Weight:  39 kg     Filed Weights   06/28/21 1905 06/29/21 0500 06/30/21 0500  Weight: 43 kg 40.9 kg 39 kg   Body mass index is 17.37 kg/m.   Gen Exam: Confused-but not in any  distress. HEENT:atraumatic, normocephalic Chest: B/L clear to auscultation anteriorly CVS:S1S2 regular Abdomen:soft non tender, non distended Extremities:no edema Neurology: Seems to be moving all 4 extremities.   Skin: no rash   I have personally reviewed following labs and imaging studies  LABORATORY DATA: CBC: Recent Labs  Lab 06/26/21 1800 06/27/21 0816 06/27/21 1317 06/28/21 0108 06/28/21 1215 06/29/21 0307  WBC 4.0 4.3 3.8* 2.3*  --  4.7  NEUTROABS 0.9*  --   --   --   --   --   HGB 10.6* 10.6* 9.9* 7.0* 12.7 13.7  HCT 35.5* 34.8* 32.9* 22.9* 40.4 42.9  MCV 91.3 89.7 89.9 90.5  --  85.6  PLT 42* 47* 43* 28*  --  41*     Basic Metabolic Panel: Recent Labs  Lab 06/26/21 1800 06/27/21 0816 06/28/21 0315 06/29/21 0558  NA 142 143 143 140  K 4.5 4.6 4.7 3.7  CL 103 105 103 101  CO2 25 24 24 25   GLUCOSE 134* 91 83 80  BUN 35* 40* 44* 17  CREATININE 6.50* 7.10* 7.79* 5.00*  CALCIUM 8.3* 8.3* 8.3* 7.8*  MG  --   --   --  1.7  PHOS  --   --  8.1* 4.6     GFR: Estimated Creatinine Clearance: 6.4 mL/min (A) (by C-G formula based on SCr of 5 mg/dL (H)).  Liver Function Tests: Recent Labs  Lab 06/26/21 1800 06/27/21 0816 06/28/21 0315 06/29/21 0558  AST 26 21  --   --   ALT 12 12  --   --   ALKPHOS 53 50  --   --   BILITOT 0.5 0.7  --   --   PROT 4.6* 4.8*  --   --   ALBUMIN 2.4* 2.4* 2.3* 2.3*    No results for input(s): LIPASE, AMYLASE in the last 168 hours. Recent Labs  Lab 06/26/21 2204  AMMONIA 34     Coagulation Profile: Recent Labs  Lab 06/26/21 1800  INR 1.2     Cardiac Enzymes: No results for input(s): CKTOTAL, CKMB, CKMBINDEX, TROPONINI in the last 168 hours.  BNP (last 3 results) No results for input(s): PROBNP in the last 8760 hours.  Lipid Profile: No results for input(s): CHOL, HDL, LDLCALC, TRIG, CHOLHDL, LDLDIRECT in the last 72 hours.  Thyroid Function Tests: Recent Labs    06/29/21 1049  TSH 2.400      Anemia Panel: Recent Labs    06/29/21 1049  VITAMINB12 3,204*     Urine analysis:    Component Value Date/Time   COLORURINE STRAW (  A) 01/12/2019 1130   APPEARANCEUR CLEAR 01/12/2019 1130   LABSPEC 1.007 01/12/2019 1130   PHURINE 7.0 01/12/2019 1130   GLUCOSEU 150 (A) 01/12/2019 1130   HGBUR NEGATIVE 01/12/2019 1130   BILIRUBINUR NEGATIVE 01/12/2019 1130   KETONESUR NEGATIVE 01/12/2019 1130   PROTEINUR >=300 (A) 01/12/2019 1130   NITRITE NEGATIVE 01/12/2019 1130   LEUKOCYTESUR NEGATIVE 01/12/2019 1130    Sepsis Labs: Lactic Acid, Venous    Component Value Date/Time   LATICACIDVEN 1.1 06/26/2021 2204    MICROBIOLOGY: Recent Results (from the past 240 hour(s))  Blood Culture (routine x 2)     Status: Abnormal   Collection Time: 06/26/21  5:26 PM   Specimen: BLOOD RIGHT HAND  Result Value Ref Range Status   Specimen Description BLOOD RIGHT HAND  Final   Special Requests   Final    BOTTLES DRAWN AEROBIC ONLY Blood Culture results may not be optimal due to an inadequate volume of blood received in culture bottles   Culture  Setup Time   Final    GRAM POSITIVE COCCI AEROBIC BOTTLE ONLY CRITICAL RESULT CALLED TO, READ BACK BY AND VERIFIED WITH: PHARMD A LAWLESS [id number] AT 9 AM BY CM    Culture (A)  Final    STAPHYLOCOCCUS EPIDERMIDIS THE SIGNIFICANCE OF ISOLATING THIS ORGANISM FROM A SINGLE SET OF BLOOD CULTURES WHEN MULTIPLE SETS ARE DRAWN IS UNCERTAIN. PLEASE NOTIFY THE MICROBIOLOGY DEPARTMENT WITHIN ONE WEEK IF SPECIATION AND SENSITIVITIES ARE REQUIRED. Performed at [facility], [place] [address] [place], [place] [address]    Report Status 06/30/2021 FINAL  Final  Blood Culture ID Panel (Reflexed)     Status: Abnormal   Collection Time: 06/26/21  5:26 PM  Result Value Ref Range Status   Enterococcus faecalis NOT DETECTED NOT DETECTED Final   Enterococcus Faecium NOT DETECTED NOT DETECTED Final   Listeria monocytogenes NOT DETECTED NOT DETECTED Final    Staphylococcus species DETECTED (A) NOT DETECTED Final    Comment: CRITICAL RESULT CALLED TO, READ BACK BY AND VERIFIED WITH: PHARMD A LAWLESS [id number] AT 855 AM BY CM    Staphylococcus aureus (BCID) NOT DETECTED NOT DETECTED Final   Staphylococcus epidermidis DETECTED (A) NOT DETECTED Final    Comment: Methicillin (oxacillin) resistant coagulase negative staphylococcus. Possible blood culture contaminant (unless isolated from more than one blood culture draw or clinical case suggests pathogenicity). No antibiotic treatment is indicated for blood  culture contaminants. CRITICAL RESULT CALLED TO, READ BACK BY AND VERIFIED WITH: PHARMD A LAWLESS [id number] AT 77 AM BY CM    Staphylococcus lugdunensis NOT DETECTED NOT DETECTED Final   Streptococcus species NOT DETECTED NOT DETECTED Final   Streptococcus agalactiae NOT DETECTED NOT DETECTED Final   Streptococcus pneumoniae NOT DETECTED NOT DETECTED Final   Streptococcus pyogenes NOT DETECTED NOT DETECTED Final   A.calcoaceticus-baumannii NOT DETECTED NOT DETECTED Final   Bacteroides fragilis NOT DETECTED NOT DETECTED Final   Enterobacterales NOT DETECTED NOT DETECTED Final   Enterobacter cloacae complex NOT DETECTED NOT DETECTED Final   Escherichia coli NOT DETECTED NOT DETECTED Final   Klebsiella aerogenes NOT DETECTED NOT DETECTED Final   Klebsiella oxytoca NOT DETECTED NOT DETECTED Final   Klebsiella pneumoniae NOT DETECTED NOT DETECTED Final   Proteus species NOT DETECTED NOT DETECTED Final   Salmonella species NOT DETECTED NOT DETECTED Final   Serratia marcescens NOT DETECTED NOT DETECTED Final   Haemophilus influenzae NOT DETECTED NOT DETECTED Final   Neisseria meningitidis NOT DETECTED NOT DETECTED Final  Pseudomonas aeruginosa NOT DETECTED NOT DETECTED Final   Stenotrophomonas maltophilia NOT DETECTED NOT DETECTED Final   Candida albicans NOT DETECTED NOT DETECTED Final   Candida auris NOT DETECTED NOT DETECTED Final   Candida  glabrata NOT DETECTED NOT DETECTED Final   Candida krusei NOT DETECTED NOT DETECTED Final   Candida parapsilosis NOT DETECTED NOT DETECTED Final   Candida tropicalis NOT DETECTED NOT DETECTED Final   Cryptococcus neoformans/gattii NOT DETECTED NOT DETECTED Final   Methicillin resistance mecA/C DETECTED (A) NOT DETECTED Final    Comment: CRITICAL RESULT CALLED TO, READ BACK BY AND VERIFIED WITH: PHARMD A LAWLESS [id number] AT 855 AM BY CM Performed at [facility], [place] [address], [place], [place] [address]   Blood Culture (routine x 2)     Status: None (Preliminary result)   Collection Time: 06/26/21  5:31 PM   Specimen: BLOOD  Result Value Ref Range Status   Specimen Description BLOOD RIGHT ANTECUBITAL  Final   Special Requests   Final    BOTTLES DRAWN AEROBIC AND ANAEROBIC Blood Culture results may not be optimal due to an inadequate volume of blood received in culture bottles   Culture   Final    NO GROWTH 4 DAYS Performed at [facility], [place] [address] [place], [place] [address]    Report Status PENDING  Incomplete  Resp Panel by RT-PCR (Flu A&B, Covid) Nasopharyngeal Swab     Status: Abnormal   Collection Time: 06/26/21  6:10 PM   Specimen: Nasopharyngeal Swab; Nasopharyngeal(NP) swabs in vial transport medium  Result Value Ref Range Status   SARS Coronavirus 2 by RT PCR POSITIVE (A) NEGATIVE Final    Comment: RESULT CALLED TO, READ BACK BY AND VERIFIED WITH: MEGAN RUGGIEO 06/26/21 @1935   BY JW (NOTE) SARS-CoV-2 target nucleic acids are DETECTED.  The SARS-CoV-2 RNA is generally detectable in upper respiratory specimens during the acute phase of infection. Positive results are indicative of the presence of the identified virus, but do not rule out bacterial infection or co-infection with other pathogens not detected by the test. Clinical correlation with patient history and other diagnostic information is necessary to determine patient infection status. The expected  result is Negative.  Fact Sheet for Patients: EntrepreneurPulse.com.au  Fact Sheet for Healthcare Providers: IncredibleEmployment.be  This test is not yet approved or cleared by the [place] FDA and  has been authorized for detection and/or diagnosis of SARS-CoV-2 by FDA under an Emergency Use Authorization (EUA).  This EUA will remain in effect (meaning this test can be  used) for the duration of  the COVID-19 declaration under Section 564(b)(1) of the Act, 21 U.S.C. section 360bbb-3(b)(1), unless the authorization is terminated or revoked sooner.     Influenza A by PCR NEGATIVE NEGATIVE Final   Influenza B by PCR NEGATIVE NEGATIVE Final    Comment: (NOTE) The Xpert Xpress SARS-CoV-2/FLU/RSV plus assay is intended as an aid in the diagnosis of influenza from Nasopharyngeal swab specimens and should not be used as a sole basis for treatment. Nasal washings and aspirates are unacceptable for Xpert Xpress SARS-CoV-2/FLU/RSV testing.  Fact Sheet for Patients: EntrepreneurPulse.com.au  Fact Sheet for Healthcare Providers: IncredibleEmployment.be  This test is not yet approved or cleared by the [place] FDA and has been authorized for detection and/or diagnosis of SARS-CoV-2 by FDA under an Emergency Use Authorization (EUA). This EUA will remain in effect (meaning this test can be used) for the duration of the COVID-19 declaration under  Section 564(b)(1) of the Act, 21 U.S.C. section 360bbb-3(b)(1), unless the authorization is terminated or revoked.  Performed at [facility], [place] [address], [place], [place] [address]   Culture, blood (routine x 2)     Status: None (Preliminary result)   Collection Time: 06/28/21 12:00 PM   Specimen: BLOOD RIGHT HAND  Result Value Ref Range Status   Specimen Description BLOOD RIGHT HAND  Final   Special Requests   Final    BOTTLES DRAWN AEROBIC AND  ANAEROBIC Blood Culture adequate volume   Culture   Final    NO GROWTH 2 DAYS Performed at [facility], [place] [address], [place], [place] [address]    Report Status PENDING  Incomplete  Culture, blood (routine x 2)     Status: None (Preliminary result)   Collection Time: 06/28/21 12:12 PM   Specimen: BLOOD RIGHT WRIST  Result Value Ref Range Status   Specimen Description BLOOD RIGHT WRIST  Final   Special Requests   Final    BOTTLES DRAWN AEROBIC AND ANAEROBIC Blood Culture adequate volume   Culture   Final    NO GROWTH 2 DAYS Performed at [facility], [place] [address], [place], [place] [address]    Report Status PENDING  Incomplete    RADIOLOGY STUDIES/RESULTS: EEG adult  Result Date: 06/30/2021 Lora Havens, MD     06/30/2021  9:44 AM Patient Name: ANISAH KUCK MRN: [id number] Epilepsy Attending: Lora Havens Referring Physician/Provider: Dr Harrold Donath Date: 06/29/2021 Duration: 23.16 mins Patient history: 70 yo female who is chronically ill with ESRD and CLL, multiple other comorbidities as listed in her PMHx, presenting 2 days ago with low grade fever and confusion. She had a seizure yesterday with no previously known history of seizures. Her risk factors for seizures are prior CVA and contusion on MRIb. EEG to evaluate for seizure. Level of alertness: Awake AEDs during EEG study: LEV Technical aspects: This EEG study was done with scalp electrodes positioned according to the 10-20 International system of electrode placement. Electrical activity was acquired at a sampling rate of 500Hz  and reviewed with a high frequency filter of 70Hz  and a low frequency filter of 1Hz . EEG data were recorded continuously and digitally stored. Description: EEG showed continuous generalized 3 to 6 Hz theta-delta slowing. Generalized periodic discharges with triphasic morphology at 1.5-2 Hz. Hyperventilation and photic stimulation were not performed.   ABNORMALITY - Periodic discharges  with triphasic morphology, generalized ( GPDs) - Continuous slow, generalized IMPRESSION: This study showed generalized periodic discharges at 1.5 to 2 Hz which is on the ictal-interictal continuum with high potential for seizures.  Additionally there is moderate diffuse encephalopathy, nonspecific etiology but could be secondary to cefepime toxicity, renal dysfunction, hepatic dysfunction, hyperammonemia.  No seizures were seen throughout the recording. If suspicion for ictal-interictal activity persists, please consider long-term EEG monitoring. [place]     LOS: 4 days   Oren Binet, MD  [facility]    To contact the attending provider between 7A-7P or the covering provider during after hours 7P-7A, please log into the web site [email or web address] and access using universal [place] password for that web site. If you do not have the password, please call the hospital operator.  06/30/2021, 1:57 PM

## 2021-06-30 NOTE — Progress Notes (Signed)
Occupational Therapy Treatment Patient Details Name: Christine Gibson MRN: [id number] DOB: 11/13/1950 Today's Date: 06/30/2021    History of present illness Patient is admitted with acute encephalopathy and pneumonia. Christine Gibson is a 70 y.o. 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 Orginally had COVID in May and has continued to test positive (tested positive in July and now in September).   OT comments  Pt. Was lethargic but could be aroused. Pt. Was inconsistant with following 1 step commands. Pt. With jerky movements during grooming tasks. Pt. Sat eob with assist and did have decreased motor control of b ue. Pt. Was returned to supine and b ue rom was preformed. Acute ot to follow.   Follow Up Recommendations  SNF    Equipment Recommendations  None recommended by OT    Recommendations for Other Services      Precautions / Restrictions Precautions Precautions: Fall Precaution Comments: HOH Restrictions Weight Bearing Restrictions: No       Mobility Bed Mobility Overal bed mobility: Needs Assistance Bed Mobility: Supine to Sit;Sit to Supine     Supine to sit: Total assist Sit to supine: Total assist        Transfers                      Balance     Sitting balance-Leahy Scale: Poor                                     ADL either performed or assessed with clinical judgement   ADL Overall ADL's : Needs assistance/impaired Eating/Feeding: Total assistance;Bed level   Grooming: Total assistance;Bed level   Upper Body Bathing: Total assistance;Bed level   Lower Body Bathing: Total assistance;Bed level   Upper Body Dressing : Total assistance;Bed level   Lower Body Dressing: Total assistance;Bed level   Toilet Transfer: Total assistance;+2 for physical assistance;+2 for safety/equipment   Toileting- Clothing Manipulation and Hygiene: Total  assistance;Bed level       Functional mobility during ADLs:  (total assist for supine to sit and sit to supne) General ADL Comments: Pt. did attempt to asist wtih washing hands and stated it felf good.     Vision   Vision Assessment?: No apparent visual deficits   Perception     Praxis      Cognition Arousal/Alertness: Awake/alert Behavior During Therapy: Restless;Flat affect Overall Cognitive Status: Impaired/Different from baseline Area of Impairment: Orientation;Attention;Memory;Following commands;Awareness;Safety/judgement;Problem solving                 Orientation Level: Person   Memory: Decreased short-term memory Following Commands: Follows one step commands inconsistently Safety/Judgement: Decreased awareness of safety;Decreased awareness of deficits   Problem Solving: Slow processing;Decreased initiation;Difficulty sequencing;Requires tactile cues          Exercises     Shoulder Instructions       General Comments      Pertinent Vitals/ Pain       Pain Assessment: No/denies pain  Home Living                                          Prior Functioning/Environment  Frequency  Min 2X/week        Progress Toward Goals  OT Goals(current goals can now be found in the care plan section)  Progress towards OT goals: Progressing toward goals  Acute Rehab OT Goals Patient Stated Goal: none stated OT Goal Formulation: With family Time For Goal Achievement: 07/12/21 Potential to Achieve Goals: Fair ADL Goals Pt Will Perform Eating: with set-up;bed level Pt Will Perform Grooming: with set-up;bed level Pt Will Transfer to Toilet: with mod assist;bedside commode Pt Will Perform Toileting - Clothing Manipulation and hygiene: with mod assist;sit to/from stand Additional ADL Goal #1: Patient will sit edge of bed with supervision to perform ADL tasks demonstrating improved balance Additional ADL Goal #2: Patient  will follow 2/3 commands exhibiting improved cognition.  Plan Discharge plan remains appropriate    Co-evaluation                 AM-PAC OT "6 Clicks" Daily Activity     Outcome Measure   Help from another person eating meals?: Total Help from another person taking care of personal grooming?: Total Help from another person toileting, which includes using toliet, bedpan, or urinal?: Total Help from another person bathing (including washing, rinsing, drying)?: Total Help from another person to put on and taking off regular upper body clothing?: Total Help from another person to put on and taking off regular lower body clothing?: Total 6 Click Score: 6    End of Session    OT Visit Diagnosis: Unsteadiness on feet (R26.81);Muscle weakness (generalized) (M62.81);Other symptoms and signs involving cognitive function   Activity Tolerance Patient limited by lethargy   Patient Left in bed;with call bell/phone within reach;with bed alarm set   Nurse Communication  (ok therapy)        Time: 6256-3893 OT Time Calculation (min): 38 min  Charges: OT General Charges $OT Visit: 1 Visit OT Treatments $Self Care/Home Management : 8-22 mins $Therapeutic Activity: 8-22 mins $Therapeutic Exercise: 8-22 mins  {Kelsy Polack OT/L    Deyanira Fesler 06/30/2021, 1:58 PM

## 2021-06-30 NOTE — Plan of Care (Signed)
  Problem: Health Behavior/Discharge Planning: Goal: Ability to manage health-related needs will improve Outcome: Progressing   

## 2021-06-30 NOTE — Progress Notes (Signed)
  Speech Language Pathology Treatment: Dysphagia  Patient Details Name: Christine Gibson MRN: [id number] DOB: October 19, 1951 Today's Date: 06/30/2021 Time: 7939-0300 SLP Time Calculation (min) (ACUTE ONLY): 17 min  Assessment / Plan / Recommendation Clinical Impression  Pt was seen for dysphagia treatment with her husband and NT present. Pt's RN, PQZRAQT, reported that the pt has been refusing crushed meds. NT stated that the pt has been tolerating thin liquids without overt s/sx of aspiration, but has been refusing meals. Pt's husband indicated that the pt does not like the food, but that refusal of p.o. intake precedes her admission. Pt's mentation is improved compared to that noted on 9/7. She tolerated dysphagia 3 solids, mixed consistency boluses and thin liquids without overt s/sx of aspiration. Mastication and swallowing were [facility] for dysphagia 3 solids, but an oral and/or pharyngeal delay is still suspected with liquids. Oral clearance was adequate. Pt's diet will be advanced to dysphagia 3 solids and thin liquids. Meds may be given whole with puree/thin liquids as tolerated. SLP will continue to follow pt.    HPI HPI: Patient is a 70 y.o. female ESRD on HD TTS, CLL, DM-2, HTN, HLD, recent CVA in July (passed [place] seen by SLP for cognition)-presented to the hospital with confusion and low-grade fever. Pt found to have had a fall, and also COVID. MRI brain: Anterior/inferior right frontal lobe-likely contusion. CT chest shows Debris in the left lower lobe bronchus with complete collapse of the left lower lobe, and near complete collapse of the right middle lobe likely due to aspiration with mucous plugging. Patchy opacities in the aerated right middle lobe and right apex also favored to reflect aspiration. Again passed [facility] this admission.      SLP Plan  Continue with current plan of care       Recommendations  Diet recommendations: Dysphagia 3 (mechanical soft);Thin liquid Liquids provided via:  Cup;Straw Medication Administration: Whole meds with puree Supervision: Staff to assist with self feeding;Full supervision/cueing for compensatory strategies Compensations: Minimize environmental distractions;Slow rate;Small sips/bites Postural Changes and/or Swallow Maneuvers: Seated upright 90 degrees                Oral Care Recommendations: Oral care BID Follow up Recommendations:  (TBD) SLP Visit Diagnosis: Dysphagia, unspecified (R13.10) Plan: Continue with current plan of care       Yavier Snider I. Hardin Negus, [place], [place] Office number [phone] Pager [phone]                Horton Marshall 06/30/2021, 5:17 PM

## 2021-06-30 NOTE — Progress Notes (Signed)
Daily Progress Note   Patient Name: Christine Gibson       Date: 06/30/2021 DOB: 11-26-1950  Age: 70 y.o. MRN#: [id number] Attending Physician: Jonetta Osgood, MD Primary Care Physician: Michela Pitcher, NP Admit Date: 06/26/2021  Reason for Consultation/Follow-up: Establishing goals of care  Subjective: Medical records reviewed. Discussed with care team. Patient assessment deferred due to COVID+ status.  I called patient's husband to continue ongoing goals of care conversation. He shares that he has read "Gone From My Sight" and this has been immensely helpful. He has been observant of her decline and we discussed how she has in fact displayed many of the expected signs and symptoms that occur during the dying process. He has started reading "Hard Choices for [employer]" and is still feeling torn about "pulling the plug." He states that today Christine Gibson appears comfortable - she is enjoying his presence, smiling and appears less restless than yesterday. This gives him hope that she may recover from her illness, although he also recognizes that she is likely rallying ahead of a continued decline. Emotional support and therapeutic listening was provided.  Discussed the option of transitioning to comfort before HD tomorrow to focus on her current happiness and allow her to continue spend as much time as possible with Christine Gibson. We discussed the excellent care available at [facility]. Counseled that her intake of broccoli is not enough to sustain her and that providing her favorite foods would be more enjoyable and aligned with a comfort-focused approach. He is struggling with this decision and shares that he is leaning towards proceeding with HD tomorrow and basing his next decisions on her response. Christine Gibson  shares that he has agreed for a DNR order and he feels this is the right thing to do, although still "touchy" about it and working on emotionally processing everything while also caring for himself.   Questions and concerns addressed. PMT will continue to support holistically.  Length of Stay: 4  Current Medications: Scheduled Meds:   sodium chloride   Intravenous Once   [START ON 07/01/2021] amLODipine  10 mg Oral Daily   carvedilol  12.5 mg Oral BID WC   Chlorhexidine Gluconate Cloth  6 each Topical Q0600   doxercalciferol  2 mcg Intravenous Q T,Th,Sa-HD   insulin aspart  0-6 Units Subcutaneous  TID WC    Continuous Infusions:  ampicillin-sulbactam (UNASYN) IV Stopped (06/30/21 0705)   levETIRAcetam Stopped (06/29/21 1838)   levETIRAcetam Stopped (06/30/21 1001)    PRN Meds: acetaminophen **OR** acetaminophen, diphenhydrAMINE, hydrALAZINE, LORazepam, morphine injection, ondansetron **OR** ondansetron (ZOFRAN) IV            Vital Signs: BP (!) 184/81 (BP Location: Right Arm)   Pulse 83   Temp 98.7 F (37.1 C) (Oral)   Resp 18   Wt 39 kg   LMP  (LMP Unknown)   SpO2 96%   BMI 17.37 kg/m  SpO2: SpO2: 96 % O2 Device: O2 Device: Room Air O2 Flow Rate: O2 Flow Rate (L/min): 1 L/min  Intake/output summary:  Intake/Output Summary (Last 24 hours) at 06/30/2021 1533 Last data filed at 06/30/2021 1500 Gross per 24 hour  Intake 424.9 ml  Output 1000 ml  Net -575.1 ml   LBM:   Baseline Weight: Weight: 44.5 kg Most recent weight: Weight: 39 kg       Palliative Assessment/Data: 20%      Patient Active Problem List   Diagnosis Date Noted   Goals of care, counseling/discussion    Palliative care by specialist    ICH (intracerebral hemorrhage) ([place]) 06/27/2021   Acute encephalopathy 06/26/2021   Right middle lobe pneumonia 06/26/2021   Sore in nostril 05/31/2021   COVID-19 virus infection 05/13/2021   ESRD needing dialysis ([place]) 05/13/2021   Acute CVA (cerebrovascular  accident) ([place]) 05/09/2021   Thrombocytopenia ([place])    Iron deficiency anemia 01/06/2021   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/2021   CKD stage 5 secondary to hypertension ([place]) 07/29/2016   HLD (hyperlipidemia) 07/19/2015   HTN (hypertension) 07/19/2015   DM (diabetes mellitus), type 2 ([place]) 07/19/2015   CLL (chronic lymphocytic leukemia) ([place])     Palliative Care Assessment & Plan   Patient Profile: 70 y.o. 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   MRI brain showed acute/subacute right frontal hemorrhage/contusion. CXR consistent with aspiration PNA. Patient has also had recurrent COVID-19 infection for several months, with no immune response to infection or vaccinations likely due to CLL. Palliative care has been consulted to assist with goals of care conversation.  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  He will then base his decisions on whether she appears to be suffering Psychosocial and emotional support provided Ongoing support from PMT  Goals of Care and Additional Recommendations: Limitations on Scope of Treatment: Full Scope Treatment  Prognosis:  Poor prognosis given multiple comorbidities and nutritional/functional/cognitive decline  Discharge Planning: To Be Determined  Care plan was discussed with patient's husband, Dr. Sloan Leiter  Total time: 25 minutes Greater than 50% of this time was spent in counseling and coordinating care related to the above assessment and plan.  Dorthy Cooler, PA-C Palliative Medicine Team Team phone # [phone]  Thank you for  allowing the Palliative Medicine Team to assist in the care of this patient. Please utilize secure chat with additional questions, if there is no response within 30 minutes please call the above phone number.  Palliative Medicine Team providers are available by phone from 7am to 7pm daily and can be reached  through the team cell phone.  Should this patient require assistance outside of these hours, please call the patient's attending physician.

## 2021-06-30 NOTE — Progress Notes (Signed)
[facility] Progress Note  Subjective: Patient not seen directly today given COVID-19 + status, utilizing data taken from chart +/- discussions w/ providers and staff.     Vitals:   06/30/21 0400 06/30/21 0500 06/30/21 0806 06/30/21 1306  BP: (!) 173/74  (!) 156/67 (!) 184/81  Pulse: 86  92 83  Resp: '20  18 18  ' Temp: 98.9 F (37.2 C)  97.6 F (36.4 C) 98.7 F (37.1 C)  TempSrc: Axillary  Oral Oral  SpO2: 97%  93% 96%  Weight:  39 kg      Exam:  Patient not seen directly today given COVID-19 + status, utilizing data taken from chart +/- discussions w/ providers and staff.          Home meds include norvasc, lipitor, coreg, ativan prn, losartan, roxanol prn, adalat 60 qd, oxy IR prn, protonix, zoloft, Zanubrutinib tiw po, prn's    CXR - IMPRESSION: 1. New right midlung infiltrate. Questionable new right hilar enlargement. Findings may be related to pneumonia with right hilar lymphadenopathy. Short-term follow-up two views chest and/or CT recommended to exclude underlying mass at this level.    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  HD today to get back on schedule.  Chronic COVID infection / FTT - pt was COVID + first during May 2022 admit, has been testing + repeatedly since then. COVID IgG Ab's were measured here and came back at non-reactive, despite full vaccination course.  AMS - persistent, delirium likely, multifactorial FTT - met w/ palliative care yesterday, appreciate efforts, working w/ family Acute/ subactue R frontal lobe contusion: per Nsurg no surg needed CLL/ chronic thrombocytopenia - f/u [facility]. Last seen in June, 2022 by [facility] and plans were to stop zanubrutinib and initiate hospice care.  Recent CVA - in July 2022 Anemia ckd - Hb 12, no esa  needed MBD ckd - cont vdra w/ hd. Ca 8.3, phos 8 HTN/ volume - under dry wt slightly. No vol excess on exam, BP's up, on home norvasc.        Rob Darlin Stenseth 06/30/2021, 1:18 PM   Recent Labs  Lab 06/28/21 0315 06/28/21 1215 06/29/21 0307 06/29/21 0558  K 4.7  --   --  3.7  BUN 44*  --   --  17  CREATININE 7.79*  --   --  5.00*  CALCIUM 8.3*  --   --  7.8*  PHOS 8.1*  --   --  4.6  HGB  --  12.7 13.7  --     Inpatient medications:  sodium chloride   Intravenous Once   amLODipine  5 mg Oral Daily   Chlorhexidine Gluconate Cloth  6 each Topical Q0600   doxercalciferol  2 mcg Intravenous Q T,Th,Sa-HD   insulin aspart  0-6 Units Subcutaneous TID WC    ampicillin-sulbactam (UNASYN) IV Stopped (06/30/21 0705)   levETIRAcetam Stopped (06/29/21 1838)   levETIRAcetam Stopped (06/30/21 1001)   acetaminophen **OR** acetaminophen, diphenhydrAMINE, LORazepam, morphine injection, ondansetron **OR** ondansetron (ZOFRAN) IV

## 2021-06-30 NOTE — Procedures (Addendum)
Patient Name: Christine Gibson  MRN: [id number]  Epilepsy Attending: Lora Havens  Referring Physician/Provider: Dr Harrold Donath Date: 06/29/2021 Duration: 23.16 mins  Patient history: 70 yo female who is chronically ill with ESRD and CLL, multiple other comorbidities as listed in her PMHx, presenting 2 days ago with low grade fever and confusion. She had a seizure yesterday with no previously known history of seizures. Her risk factors for seizures are prior CVA and contusion on MRIb. EEG to evaluate for seizure.   Level of alertness: Awake  AEDs during EEG study: LEV  Technical aspects: This EEG study was done with scalp electrodes positioned according to the 10-20 International system of electrode placement. Electrical activity was acquired at a sampling rate of 500Hz  and reviewed with a high frequency filter of 70Hz  and a low frequency filter of 1Hz . EEG data were recorded continuously and digitally stored.   Description: EEG showed continuous generalized 3 to 6 Hz theta-delta slowing. Generalized periodic discharges with triphasic morphology at 1.5-2 Hz. Hyperventilation and photic stimulation were not performed.     ABNORMALITY - Periodic discharges with triphasic morphology, generalized ( GPDs) - Continuous slow, generalized  IMPRESSION: This study showed generalized periodic discharges at 1.5 to 2 Hz which is on the ictal-interictal continuum with high potential for seizures.  Additionally there is moderate diffuse encephalopathy, nonspecific etiology but could be secondary to cefepime toxicity, renal dysfunction, hepatic dysfunction, hyperammonemia.  No seizures were seen throughout the recording.  If suspicion for ictal-interictal activity persists, please consider long-term EEG monitoring.  Jayle Solarz Barbra Sarks

## 2021-06-30 NOTE — Progress Notes (Addendum)
Neurology Progress Note  S: Patient is awake today. Smiling.   There is no noted ongoing seizure activity per nurse charting.   Palliative care meeting with husband yesterday resulted in continued FULL CODE with full scope of treatment.   O: Current vital signs: BP (!) 156/67 (BP Location: Right Arm)   Pulse 92   Temp 97.6 F (36.4 C) (Oral)   Resp 18   Wt 39 kg   LMP  (LMP Unknown)   SpO2 93%   BMI 17.37 kg/m  Vital signs in last 24 hours: Temp:  [97.6 F (36.4 C)-99.6 F (37.6 C)] 97.6 F (36.4 C) (09/09 0806) Pulse Rate:  [75-98] 92 (09/09 0806) Resp:  [13-20] 18 (09/09 0806) BP: (134-184)/(66-103) 156/67 (09/09 0806) SpO2:  [93 %-98 %] 93 % (09/09 0806) Weight:  [39 kg] 39 kg (09/09 0500)  GENERAL: Chronically ill appearing. Frail. Awake, alert in NAD. HEENT: Normocephalic and atraumatic. LUNGS: Normal respiratory effort.  Ext: warm.  NEURO:  Mental Status: Alert and oriented to name. Can not tell NP why she is here. Mumbles a lot. Disoriented to all other questions. Speech/Language: Speaking in more than one word phrases today, but her speech is still nonsensical.  Mumbles if speaks in more than one word phrases. Able to name pinky and thumb. Unable to repeat. Bradyphrenic, confused.  Cranial Nerves:  PERRL. Eyelids elevate symmetrically. Smile is symmetrical. Hearing intact to voice.  Motor: She moves all four extremities spontaneously and purposefully. She grips on command today (4+/5), but will not participate in bicep, tricep, thigh, knee, or plantar/dorsiflexion strength exam.  Tone: is normal and bulk is decreased.  Sensation- Moves to noxious stimuli.    Coordination: does not understand exam.  Gait- deferred.  Medications  Current Facility-Administered Medications:    0.9 %  sodium chloride infusion (Manually program via Guardrails IV Fluids), , Intravenous, Once, Alcario Drought, Jared M, DO   acetaminophen (TYLENOL) tablet 650 mg, 650 mg, Oral, Q6H PRN **OR**  acetaminophen (TYLENOL) suppository 650 mg, 650 mg, Rectal, Q6H PRN, Alcario Drought, Jared M, DO   amLODipine (NORVASC) tablet 5 mg, 5 mg, Oral, Daily, Ghimire, Henreitta Leber, MD, 5 mg at 06/30/21 0945   Ampicillin-Sulbactam (UNASYN) 3 g in sodium chloride 0.9 % 100 mL IVPB, 3 g, Intravenous, Q24H, Millen, Jessica B, RPH, Stopping Infusion hung by another clincian at 06/30/21 0705   Chlorhexidine Gluconate Cloth 2 % PADS 6 each, 6 each, Topical, Q0600, Adelfa Koh, NP, 6 each at 06/30/21 0535   diphenhydrAMINE (BENADRYL) injection 25 mg, 25 mg, Intravenous, Q6H PRN, Chotiner, Yevonne Aline, MD, 25 mg at 06/29/21 2256   doxercalciferol (HECTOROL) injection 2 mcg, 2 mcg, Intravenous, Q T,Th,Sa-HD, Roney Jaffe, MD, 2 mcg at 06/29/21 1326   insulin aspart (novoLOG) injection 0-6 Units, 0-6 Units, Subcutaneous, TID WC, Ghimire, Henreitta Leber, MD   levETIRAcetam (KEPPRA) 250 mg in sodium chloride 0.9 % 100 mL IVPB, 250 mg, Intravenous, Q T,Th,Sa-HD, Kerney Elbe, MD, Stopped at 06/29/21 1838   levETIRAcetam (KEPPRA) IVPB 500 mg/100 mL premix, 500 mg, Intravenous, Daily, Ghimire, Henreitta Leber, MD, Stopped at 06/30/21 1001   LORazepam (ATIVAN) injection 1 mg, 1 mg, Intravenous, Q4H PRN, Kirby-Graham, Karsten Fells, NP   morphine 2 MG/ML injection 1-2 mg, 1-2 mg, Intravenous, Q4H PRN, Etta Quill, DO, 1 mg at 06/29/21 2256   ondansetron (ZOFRAN) tablet 4 mg, 4 mg, Oral, Q6H PRN **OR** ondansetron (ZOFRAN) injection 4 mg, 4 mg, Intravenous, Q6H PRN, Etta Quill, DO  Pertinent  Labs Vitamin B12 3204      TSH 2.400      Glucose 80     Plts  41  No new Imaging  EEG This study showed generalized periodic discharges at 1.5 to 2 Hz which is on the ictal-interictal continuum with high potential for seizures.  Additionally there is moderate diffuse encephalopathy, nonspecific etiology but could be secondary to cefepime toxicity, renal dysfunction, hepatic dysfunction, hyperammonemia.  No seizures were seen throughout  the recording. If suspicion for ictal-interictal activity persists, please consider long-term EEG monitoring.   Assessment: 70 yo female who is chronically ill with ESRD and CLL, multiple other comorbidities as listed in her PMHx, presenting 2 days ago with low grade fever and confusion. Found to have PNA and is being treated with antibiotics. She had a seizure yesterday with no previously known history of seizures. Her risk factors for seizures are prior CVA and contusion on MRI brain. She was loaded with Keppra and is on renally dosed Keppra daily and after HD on HD days.  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  -2nd EEG + for GPEDS which can be caused by Cefepime which has been discontinued in favor of Unasyn.  -Given findings on 2nd EEG, will place overnight EEG with video.  -We may have to add AED depending on overnight EEG results, as she is already at max dose of Keppra for renal adjustment.  -Palliative family meeting yesterday. Patient is still a full code with full scope of treatment.   Impression: -New onset seizure activity -GPEDs on EEG, possibly secondary to Cefepime.  -Multifactorial encephalopathy, improved today.  -Brain contusion, query fall last month.  -On Hospice at home.  Recommendations/Plan:  -LTM with video overnight.  -She may need additional AED if LTM is positive.  -Cefepime changed to a different antibiotic. LTM EEG to determine if GPEDs resolve.   -Seizure precautions.  -Ativan 1mg  IV for seizure over 5 mins and call Neurology.  -Continue Keppra 500 mg IV daily as well as 250mg  IV after all HD sessions.  -Avoid medications on [facility]'s list for the elderly.  -Appropriate PPE worn for COVID precautions.   Pt  seen by Clance Boll, MSN, APN-BC/Nurse Practitioner/Neuro   Pager: [phone]  Electronically signed: Dr. Kerney Elbe

## 2021-06-30 NOTE — Progress Notes (Signed)
LTM EEG hooked up and running - no initial skin breakdown - neuro notified. [facility] monitoring.

## 2021-06-30 NOTE — Consult Note (Signed)
   [facility] CM Inpatient Consult   06/30/2021  Christine Gibson 1951-02-06 [id number]  [place] Organization [ACO] Patient: [employer]   Primary Care Provider:  Michela Pitcher, NP [name] is an Embedded provider with a chronic care management team and program.  Patient screened for hospitalization with noted high risk score for unplanned readmission risk.  Review of patient's medical record reveals patient is for a skilled nursing facility level of care.    Plan:  Continue to follow progress and disposition to assess for post hospital care management needs.    For questions contact:   Natividad Brood, RN BSN [facility] Liaison  [phone] business mobile phone Toll free office [phone]  Fax number: [phone] [email or web address]

## 2021-06-30 NOTE — TOC Initial Note (Addendum)
Transition of Care [facility]) - Initial/Assessment Note    Patient Details  Name: Christine Gibson MRN: [id number] Date of Birth: [birth year]  Transition of [facility]) CM/SW Contact:    Benard Halsted, LCSW Phone Number: 06/30/2021, 3:21 PM  Clinical Narrative:                 CSW received request from MD to speak with patient's spouse regarding end of life. CSW spoke with [name]. He requested information on the end of life process. CSW explained that once a patient transitions to comfort care, there are two options for hospice services. The first would be home hospice like patient currently has with [facility] where family would provide the bulk of care. The second being a residential hospice facility which would provide 24/hr care. He reported that family would be unable to care for her and he would prefer the [facility] facility. He then asked what to do after patient passes away. CSW clarified that he meant what to do with her body at hospice. CSW explained that hospice would assist him in arranging transportation to a funeral home. He stated that she had picked out a funeral home and wanted to be cremated but he has not paid them yet as he feels wrong doing that now. CSW expressed understanding and provided supportive listening. He thanked CSW for support. CSW left voicemail for Tim with [facility] to see if they would need to revoke services prior to being able to refer patient to [facility] (as [facility] does not have a residential hospice facility). CSW will continue to follow for official recommendations from the MD.   CSW received return call from [place] with [facility]. He stated that instead of doing a hospice requisition form, [facility] can do a hospice transfer on the day that they have a bed available. CSW will follow up once official referral made.   Expected Discharge Plan: [place] Barriers to Discharge: Continued Medical Work  up   Patient Goals and CMS Choice Patient states their goals for this hospitalization and ongoing recovery are:: comfort CMS Medicare.gov Compare Post Acute Care list provided to:: Patient Represenative (must comment) Choice offered to / list presented to : Spouse  Expected Discharge Plan and Services Expected Discharge Plan: [place] In-house Referral: Clinical Social Work, Hospice / [place] Acute Care Choice: Hospice Living arrangements for the past 2 months: Single Family Home                                      Prior Living Arrangements/Services Living arrangements for the past 2 months: Single Family Home Lives with:: Spouse Patient language and need for interpreter reviewed:: Yes Do you feel safe going back to the place where you live?: Yes      Need for Family Participation in Patient Care: Yes (Comment) Care giver support system in place?: Yes (comment) Current home services: Hospice Criminal Activity/Legal Involvement Pertinent to Current Situation/Hospitalization: No - Comment as needed  Activities of Daily Living Home Assistive Devices/Equipment: Bedside commode/3-in-1 ADL Screening (condition at time of admission) Patient's cognitive ability adequate to safely complete daily activities?: No Is the patient deaf or have difficulty hearing?: Yes Does the patient have difficulty seeing, even when wearing glasses/contacts?: No Does the patient have difficulty concentrating, remembering, or making decisions?: Yes Patient able to express need for assistance with  ADLs?: No Does the patient have difficulty dressing or bathing?: Yes Independently performs ADLs?: No Communication: Needs assistance Is this a change from baseline?: Pre-admission baseline Dressing (OT): Needs assistance Is this a change from baseline?: Pre-admission baseline Grooming: Needs assistance Is this a change from baseline?: Pre-admission baseline Feeding:  Needs assistance Is this a change from baseline?: Pre-admission baseline Bathing: Needs assistance Is this a change from baseline?: Pre-admission baseline Toileting: Needs assistance Is this a change from baseline?: Pre-admission baseline In/Out Bed: Needs assistance Is this a change from baseline?: Change from baseline, expected to last >3 days Walks in Home: Needs assistance Is this a change from baseline?: Pre-admission baseline Does the patient have difficulty walking or climbing stairs?: Yes Weakness of Legs: Both Weakness of Arms/Hands: None  Permission Sought/Granted Permission sought to share information with : Facility Sport and exercise psychologist, Family Supports Permission granted to share information with : No  Share Information with NAME: [name]  Permission granted to share info w AGENCY: hospice  Permission granted to share info w Relationship: Spouse  Permission granted to share info w Contact Information: [phone]  Emotional Assessment Appearance:: Appears stated age Attitude/Demeanor/Rapport: Unable to Assess Affect (typically observed): Unable to Assess Orientation: :  (Disoriented x4) Alcohol / Substance Use: Not Applicable Psych Involvement: No (comment)  Admission diagnosis:  Pain [R52] Acute encephalopathy [G93.40] Community acquired pneumonia of right middle lobe of lung [J18.9] COVID-19 [U07.1] Patient Active Problem List   Diagnosis Date Noted   Goals of care, counseling/discussion    Palliative care by specialist    ICH (intracerebral hemorrhage) ([place]) 06/27/2021   Acute encephalopathy 06/26/2021   Right middle lobe pneumonia 06/26/2021   Sore in nostril 05/31/2021   COVID-19 virus infection 05/13/2021   ESRD needing dialysis ([place]) 05/13/2021   Acute CVA (cerebrovascular accident) ([place]) 05/09/2021   Thrombocytopenia ([place])    Iron deficiency anemia 01/06/2021   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/2021   CKD stage 5 secondary to hypertension ([place]) 07/29/2016   HLD  (hyperlipidemia) 07/19/2015   HTN (hypertension) 07/19/2015   DM (diabetes mellitus), type 2 ([place]) 07/19/2015   CLL (chronic lymphocytic leukemia) ([place])    PCP:  Michela Pitcher, NP Pharmacy:   [place], [place] - [place], [place] [address], [place] [address] Phone: [phone] Fax: [phone]  [id number] - [place], [place] [address] [place] [address] Phone: [phone] Fax: [phone]     Social Determinants of Health (SDOH) Interventions    Readmission Risk Interventions No flowsheet data found.

## 2021-06-30 NOTE — Plan of Care (Signed)
  Problem: Coping: Goal: Ability to identify and develop effective coping behavior will improve Outcome: Progressing   Problem: Health Behavior/Discharge Planning: Goal: Ability to manage health-related needs will improve Outcome: Progressing

## 2021-07-01 DIAGNOSIS — R569 Unspecified convulsions: Secondary | ICD-10-CM | POA: Diagnosis not present

## 2021-07-01 DIAGNOSIS — U071 COVID-19: Secondary | ICD-10-CM | POA: Diagnosis not present

## 2021-07-01 DIAGNOSIS — J189 Pneumonia, unspecified organism: Secondary | ICD-10-CM | POA: Diagnosis not present

## 2021-07-01 DIAGNOSIS — I120 Hypertensive chronic kidney disease with stage 5 chronic kidney disease or end stage renal disease: Secondary | ICD-10-CM | POA: Diagnosis not present

## 2021-07-01 DIAGNOSIS — D631 Anemia in chronic kidney disease: Secondary | ICD-10-CM | POA: Diagnosis not present

## 2021-07-01 DIAGNOSIS — G9340 Encephalopathy, unspecified: Secondary | ICD-10-CM | POA: Diagnosis not present

## 2021-07-01 DIAGNOSIS — R4182 Altered mental status, unspecified: Secondary | ICD-10-CM | POA: Diagnosis not present

## 2021-07-01 DIAGNOSIS — N186 End stage renal disease: Secondary | ICD-10-CM | POA: Diagnosis not present

## 2021-07-01 DIAGNOSIS — I639 Cerebral infarction, unspecified: Secondary | ICD-10-CM | POA: Diagnosis not present

## 2021-07-01 DIAGNOSIS — R531 Weakness: Secondary | ICD-10-CM | POA: Diagnosis not present

## 2021-07-01 DIAGNOSIS — N250 Renal osteodystrophy: Secondary | ICD-10-CM | POA: Diagnosis not present

## 2021-07-01 DIAGNOSIS — Z992 Dependence on renal dialysis: Secondary | ICD-10-CM | POA: Diagnosis not present

## 2021-07-01 LAB — GLUCOSE, CAPILLARY
Glucose-Capillary: 77 mg/dL (ref 70–99)
Glucose-Capillary: 131 mg/dL — ABNORMAL HIGH (ref 70–99)
Glucose-Capillary: 108 mg/dL — ABNORMAL HIGH (ref 70–99)
Glucose-Capillary: 146 mg/dL — ABNORMAL HIGH (ref 70–99)

## 2021-07-01 LAB — CULTURE, BLOOD (ROUTINE X 2): Culture: NO GROWTH

## 2021-07-01 NOTE — Progress Notes (Addendum)
Patient constantly moaning, restless. Patient was able to verbalize "back hurt", but unable to give pain score. Blood pressure Pain medication administered.

## 2021-07-01 NOTE — Procedures (Addendum)
Patient Name: Christine Gibson  MRN: [id number]  Epilepsy Attending: Lora Havens  Referring Physician/Provider: Clance Boll, NP Duration: 06/30/2021 1156 to 07/01/2021 1156   Patient history: 70 yo female who is chronically ill with ESRD and CLL, multiple other comorbidities as listed in her PMHx, presenting 2 days ago with low grade fever and confusion. She had a seizure yesterday with no previously known history of seizures. Her risk factors for seizures are prior CVA and contusion on MRIb. EEG to evaluate for seizure.    Level of alertness: Awake   AEDs during EEG study: LEV   Technical aspects: This EEG study was done with scalp electrodes positioned according to the 10-20 International system of electrode placement. Electrical activity was acquired at a sampling rate of 500Hz  and reviewed with a high frequency filter of 70Hz  and a low frequency filter of 1Hz . EEG data were recorded continuously and digitally stored.    Description: EEG showed continuous generalized 3 to 6 Hz theta-delta slowing. Generalized periodic discharges with triphasic morphology at 1.5-2 Hz were noted predominantly when awake/stimulated. These gradually improved in frequency to 0.5-1hz .  Hyperventilation and photic stimulation were not performed.      ABNORMALITY - Periodic discharges with triphasic morphology, generalized ( GPDs) - Continuous slow, generalized   IMPRESSION: This study showed generalized periodic discharges which were initially at at 1.5 to 2 Hz and gradually improved to 0.5-1hz , predominantly when awake/stimulated. The morphology of discharges, reactivity to stimulation and improvement after discontinuing cefepime is most likely indicative of toxic-metabolic etiology.  Additionally there is moderate diffuse encephalopathy, nonspecific etiology but most likely secondary to cefepime toxicity, renal dysfunction.  No seizures were seen throughout the recording.  EEG appears to be improving  compared to previous day.    Jaselle Pryer Barbra Sarks

## 2021-07-01 NOTE — Progress Notes (Signed)
LTM EEG running - no skin breakdown check performed, impedances all below 10k ohms, [facility] continues monitoring.

## 2021-07-01 NOTE — Progress Notes (Signed)
Daily Progress Note   Patient Name: Christine Gibson       Date: 07/01/2021 DOB: 1951/03/07  Age: 70 y.o. MRN#: [id number] Attending Physician: Thurnell Lose, MD Primary Care Physician: Michela Pitcher, NP Admit Date: 06/26/2021  Reason for Consultation/Follow-up: Establishing goals of care  Subjective: Medical records reviewed. Patient assessed at the bedside. She remains confused, responds to yes/no questions at times. I met with patient's husband [name] to continue goals of care discussion.   [name] continues to consider best case and worst case scenario outcomes and the patient's wishes if she were able to speak for herself. He shares that she has previously made statements such as "I'm tried of doing this" but they did not discuss at length. [name] remains hopeful that she will become more conversant and participate in these discussions to share her preferences. He feels that she would be more willing to discuss her readiness for comfort care in more detail with PMT than she has with him. He also feels that she has noticed her DNR bracelet and understands the implication, as she was an Hydrologist. He is still adjusting to the thought of losing his wife. I encouraged [name] to continue his efforts to do what is best for her and be prepared to make difficult decisions, as she may not improve enough to participate.   We discussed her nutritional status, lethargy, and risk of recurrent aspiration pneumonia. A MOST form was introduced and reviewed in detail.   Questions and concerns addressed. PMT will continue to support holistically.  Length of Stay: 5  Current Medications: Scheduled Meds:   sodium chloride   Intravenous Once   amLODipine  10 mg Oral Daily   carvedilol  12.5 mg Oral BID WC    Chlorhexidine Gluconate Cloth  6 each Topical Q0600   doxercalciferol  2 mcg Intravenous Q T,Th,Sa-HD   insulin aspart  0-6 Units Subcutaneous TID WC    Continuous Infusions:  ampicillin-sulbactam (UNASYN) IV 3 g (06/30/21 2105)   levETIRAcetam Stopped (06/29/21 1838)   levETIRAcetam Stopped (06/30/21 1001)    PRN Meds: acetaminophen **OR** acetaminophen, diphenhydrAMINE, hydrALAZINE, LORazepam, morphine injection, ondansetron **OR** ondansetron (ZOFRAN) IV            Vital Signs: BP (!) 181/78 (BP Location: Right Arm)   Pulse 85  Temp 98 F (36.7 C) (Oral)   Resp 19   Wt 38.8 kg   LMP  (LMP Unknown)   SpO2 99%   BMI 17.28 kg/m  SpO2: SpO2: 99 % O2 Device: O2 Device: Room Air O2 Flow Rate: O2 Flow Rate (L/min): 1 L/min  Intake/output summary:  Intake/Output Summary (Last 24 hours) at 07/01/2021 0908 Last data filed at 06/30/2021 1500 Gross per 24 hour  Intake 220 ml  Output --  Net 220 ml    LBM:   Baseline Weight: Weight: 44.5 kg Most recent weight: Weight: 38.8 kg       Palliative Assessment/Data: 20%      Patient Active Problem List   Diagnosis Date Noted   Goals of care, counseling/discussion    Palliative care by specialist    ICH (intracerebral hemorrhage) ([facility]) 06/27/2021   Acute encephalopathy 06/26/2021   Right middle lobe pneumonia 06/26/2021   Sore in nostril 05/31/2021   COVID-19 05/13/2021   ESRD needing dialysis ([facility]) 05/13/2021   Acute CVA (cerebrovascular accident) ([facility]) 05/09/2021   Thrombocytopenia ([facility])    Iron deficiency anemia 01/06/2021   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/2021   CKD stage 5 secondary to hypertension ([facility]) 07/29/2016   HLD (hyperlipidemia) 07/19/2015   HTN (hypertension) 07/19/2015   DM (diabetes mellitus), type 2 ([facility]) 07/19/2015   CLL (chronic lymphocytic leukemia) ([facility])     Palliative Care Assessment & Plan   Patient Profile: 70 y.o. 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   MRI brain showed acute/subacute right frontal hemorrhage/contusion. CXR consistent with aspiration PNA. Patient has also had recurrent COVID-19 infection for several months, with no immune response to infection or vaccinations likely due to CLL. Palliative care has been consulted to assist with goals of care conversation.  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 ESRD on HD Goals of care conversation  Recommendations/Plan: DNR  Full scope treatment [name] remains cautiously hopeful for improvement, understands she is at high risk to decline rapidly Psychosocial and emotional support provided PMT will f/u on Monday 9/12, please reach out for any acute needs  Goals of Care and Additional Recommendations: Limitations on Scope of Treatment: Full Scope Treatment  Prognosis:  Poor prognosis given multiple comorbidities and nutritional/functional/cognitive decline  Discharge Planning: To Be Determined  Care plan was discussed with patient's husband   Total time: 35 minutes Greater than 50% of this time was spent in counseling and coordinating care related to the above assessment and plan.   , PA-C Palliative Medicine Team Team phone # [phone]  Thank you for allowing the Palliative Medicine Team to assist in the care of this patient. Please utilize secure chat with additional questions, if there is no response within 30 minutes please call the above phone number.  Palliative Medicine Team providers are available by phone from 7am to 7pm daily and can be reached through the team cell phone.  Should this patient require assistance outside of these hours, please call the patient's attending physician.    

## 2021-07-01 NOTE — Progress Notes (Signed)
LTM maint complete - no skin breakdown  Maintenance Cz Fp1 F4 [facility] monitored, Event button test confirmed by [facility].

## 2021-07-01 NOTE — Progress Notes (Signed)
[facility] Progress Note  Subjective: seen in room, remains confused   Vitals:   07/01/21 0000 07/01/21 0400 07/01/21 0500 07/01/21 0823  BP: (!) 175/96 (!) 172/83  (!) 181/78  Pulse: 82 83  85  Resp: 18 12  19   Temp: 99 F (37.2 C) 98.5 F (36.9 C)  98 F (36.7 C)  TempSrc: Axillary Axillary  Oral  SpO2: 97% 98%  99%  Weight:   38.8 kg     Exam:   Lethargic, confused, nad   no jvd  Chest cta bilat  Cor reg no RG  Abd soft ntnd no ascites   Ext no LE edema   Nonfocal, confused   L arm AVG+bruit       Home meds include norvasc, lipitor, coreg, ativan prn, losartan, roxanol prn, adalat 60 qd, oxy IR prn, protonix, zoloft, Zanubrutinib tiw po, prn's    CXR - IMPRESSION: 1. New right midlung infiltrate. Questionable new right hilar enlargement. Findings may be related to pneumonia with right hilar lymphadenopathy. Short-term follow-up two views chest and/or CT recommended to exclude underlying mass at this level.    OP HD: TTS [place]  3.5h  41.5kg  400/500  3K/2.5 bath    L AVG  Hep NONE (due to low plts)  - hect 2 ug tiw  - mircera 200 q2 last 8/30     Assessment/ Plan: Aspiration PNA - RML, on IV unasyn now AMS - recent CVA, contusion, seizures, COVID-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 Not improving. Very poor prognosis.  ESRD - on HD TTS.  HD today.  Chronic COVID infection / FTT - pt was COVID + first during May 2022 admit, has been testing + repeatedly since then. Negative testing for COVID antibodies FTT - appreciate palliative efforts, working w/ family Acute/ subactue R frontal lobe contusion: per Nsurg no surg needed CLL/ chronic thrombocytopenia - f/u [facility]. Last seen in June, 2022 by [facility] and plans were to stop zanubrutinib and initiate hospice care.  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 Ca 8.3, phos 8 HTN/ volume - under dry wt slightly. No vol excess on exam, BP's up, on home norvasc.         Christine Gibson 07/01/2021, 10:27 AM   Recent Labs  Lab 06/28/21 0315 06/28/21 1215 06/29/21 0307 06/29/21 0558  K 4.7  --   --  3.7  BUN 44*  --   --  17  CREATININE 7.79*  --   --  5.00*  CALCIUM 8.3*  --   --  7.8*  PHOS 8.1*  --   --  4.6  HGB  --  12.7 13.7  --     Inpatient medications:  sodium chloride   Intravenous Once   amLODipine  10 mg Oral Daily   carvedilol  12.5 mg Oral BID WC   Chlorhexidine Gluconate Cloth  6 each Topical Q0600   doxercalciferol  2 mcg Intravenous Q T,Th,Sa-HD   insulin aspart  0-6 Units Subcutaneous TID WC    ampicillin-sulbactam (UNASYN) IV Stopped (06/30/21 2135)   levETIRAcetam Stopped (06/29/21 1838)   levETIRAcetam Stopped (06/30/21 1001)   acetaminophen **OR** acetaminophen, diphenhydrAMINE, hydrALAZINE, LORazepam, morphine injection, ondansetron **OR** ondansetron (ZOFRAN) IV

## 2021-07-01 NOTE — Progress Notes (Signed)
Neurology Progress Note  Brief HPI: 70 y.o. female with PMHx of CVA 04/2021, COVID, HTN, ESRD on HD TTS, type 2 DM, CLL, HLD, on home hospice who presented from home 9/5 for evaluation of confusion and low grade fever after missed HD appointments and was found to have aspiration PNA. She was started on abx and admitted. MRI brain revealed a small acute to subacute right frontal hemorrhage that was felt to be consistent with a contusion with a reported fall with head strike approximately 1 month PTA. She had a witnessed 2 minute GTC seizure 9/7 after HD and was started on renally dosed Keppra.   Subjective: No acute overnight events noted  Exam: Vitals:   07/01/21 0400 07/01/21 0823  BP: (!) 172/83 (!) 181/78  Pulse: 83 85  Resp: 12 19  Temp: 98.5 F (36.9 C) 98 F (36.7 C)  SpO2: 98% 99%   Gen: Frail elderly female laying comfortably in hospital bed, in no acute distress Resp: non-labored breathing, no respiratory distress on room air Abd: soft, non-tender, non-distended  Neuro: Mental Status: Asleep initially, wakes easily to voice and light touch.  She does not answer most orientation questions or follow commands.  [place]" when asked where she is. She does not answer her name, year, or situation. When asked the year she states "two thousand, twenty, twenty, twenty thousand" repetitively. She is perseverating on the year and answers the same when asked further orientation questions. She was able to clearly state "I have to go to the bathroom" but otherwise, she mumbled frequently.  Initially she did not participate to name objects, however, once she was repositioned in bed, she was able to name 4/5 objects before she began perseverating on "two thousand" again.  She does not repeat phrases. Poor attention noted.  Cranial Nerves: PERRL, will fixate and track examiner, face is symmetric resting and with movement, hearing is intact to voice, phonation  normal, does not protrude tongue on command.  Motor: Moves all extremities spontaneously but does not follow commands for formal strength evaluation. She will temporarily elevate extremities with antigravity movement but despite constant coaching, she loses attention and drops them. She is able to kick both legs out and draw them up spontaneously without asymmetry noted 4/5 strength.  Tone is normal, bulk is decreased throughout.  Sensory: Increases movement of each extremity with manipulation, she does not provide information regarding symmetry or sensation to light touch.  Coordination: She is unable to follow commands.  DTR: 2+ and symmetric biceps and brachioradialis Gait: Deferred  Pertinent Labs: CBC    Component Value Date/Time   WBC 4.7 06/29/2021 0307   RBC 5.01 06/29/2021 0307   HGB 13.7 06/29/2021 0307   HCT 42.9 06/29/2021 0307   PLT 41 (L) 06/29/2021 0307   MCV 85.6 06/29/2021 0307   MCH 27.3 06/29/2021 0307   MCHC 31.9 06/29/2021 0307   RDW 17.3 (H) 06/29/2021 0307   LYMPHSABS 2.4 06/26/2021 1800   MONOABS 0.6 06/26/2021 1800   EOSABS 0.1 06/26/2021 1800   BASOSABS 0.0 06/26/2021 1800   CMP     Component Value Date/Time   NA 140 06/29/2021 0558   K 3.7 06/29/2021 0558   CL 101 06/29/2021 0558   CO2 25 06/29/2021 0558   GLUCOSE 80 06/29/2021 0558   BUN 17 06/29/2021 0558   CREATININE 5.00 (H) 06/29/2021 0558   CREATININE 4.72 (H) 01/06/2021 1615   CALCIUM 7.8 (L) 06/29/2021 7741  PROT 4.8 (L) 06/27/2021 0816   ALBUMIN 2.3 (L) 06/29/2021 0558   AST 21 06/27/2021 0816   ALT 12 06/27/2021 0816   ALKPHOS 50 06/27/2021 0816   BILITOT 0.7 06/27/2021 0816   GFRNONAA 9 (L) 06/29/2021 0558   GFRAA 12 (L) 01/12/2019 0908   Lab Results  Component Value Date   VITAMINB12 3,204 (H) 06/29/2021   Imaging Reviewed:  MRI brain wo contrast 06/27/2021: 1. Motion degraded exam. 2. 2 cm focus of FLAIR and T1 hyperintensity involving the anterior/inferior right frontal  lobe, new as compared to recent MRI from 05/09/2021. In comparison with prior CT from 06/26/2021, there appears to be a small extra-axial hemorrhage and probable parenchymal contusion at this location. This is likely subacute in nature without significant regional mass effect or edema. Correlation with history for possible recent trauma suggested. Appearance would be atypical and unusual for intracranial spread of infection from adjacent paranasal sinus disease, which would be the primary differential consideration. Short interval follow-up MRI in 2-3 months to ensure these changes resolve is suggested. If there is concern for possible intracranial infection, then correlation with dedicated LP and CSF studies could be performed as warranted. 3. No other acute intracranial abnormality. 4. 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 5. Extensive pan sinusitis with large bilateral mastoid effusions.  Overnight EEG 06/30/2021 - 07/01/2021: "This study showed generalized periodic discharges which were initially at at 1.5 to 2 Hz and gradually improved to 0.5-1hz , predominantly when awake/stimulated. The morphology of discharges, reactivity to stimulation and improvement after discontinuing cefepime is most likely indicative of toxic-metabolic etiology.  Additionally there is moderate diffuse encephalopathy, nonspecific etiology but most likely secondary to cefepime toxicity, renal dysfunction.  No seizures were seen throughout the recording. EEG appears to be improving compared to previous day."  Routine EEG 06/30/2021: "This study showed generalized periodic discharges at 1.5 to 2 Hz which is on the ictal-interictal continuum with high potential for seizures.  Additionally there is moderate diffuse encephalopathy, nonspecific etiology but could be secondary to cefepime toxicity, renal dysfunction, hepatic dysfunction, hyperammonemia.  No  seizures were seen throughout the recording. If suspicion for ictal-interictal activity persists, please consider long-term EEG monitoring."  Assessment:  70 yo female who is chronically ill with ESRD and CLL, multiple other comorbidities as listed in her PMHx, presenting 06/26/2021 with low grade fever and confusion. Found to have PNA and is being treated with antibiotics. She had a seizure 06/28/2021 following HD with no previously known history of seizures. Her risk factors for seizures are prior CVA and contusion on MRI brain. She was loaded with Keppra and is on renally dosed Keppra daily and after HD on HD days.  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  - 9/9 EEG + for GPEDS which can be caused by Cefepime which has been discontinued in favor of Unasyn.  - Overnight EEG revealed GPEDS with gradual improvement overnight that occur mostly while patient is awake or stimulated, however, the morphology of discharges is felt to be most likely indicative of a toxic-metabolic etiology with improvement following discontinuation of Cefepime in favor of Unasyn. EEG also revealed moderate diffuse encephalopathy of nonspecific etiology but without seizures. EEG is noted to be improving today compared to yesterday.  -Will consider additional AED medication following  further EEG results, as she is already at max dose of Keppra for renal adjustment. Unable to obtain contrasted MRI brain imaging due to patient ESRD.  -Palliative family meeting 9/7. Patient is still a full code with full scope of treatment.    Impression:  -New onset seizure activity -GPEDs on EEG, possibly secondary to Cefepime.  -Multifactorial encephalopathy, improved today.  -Brain contusion, query fall last  month.  -On Hospice at home.  Recommendations: -Continue EEG for 24 more hours -Continue inpatient seizure precautions -Ativan 1 mg IV for seizure over 5 mins and call Neurology.  -Continue Keppra 500 mg IV daily as well as 250mg  IV after all HD sessions.  -Avoid medications on [employer] list for the elderly.   Anibal Henderson, AGACNP-BC [facility] [phone]  Electronically signed: Dr. Kerney Elbe

## 2021-07-01 NOTE — Progress Notes (Signed)
D/C patient. [facility] notified. Nurse assisted no skin breakdown was noted.

## 2021-07-01 NOTE — Progress Notes (Signed)
Pt was supposed to go to HD, however is on continuous EEG. Transfer was sent. It was communicated that HD needs to communicate with EEG tech. Second time transfer came but no EEG tech available. I was told by HD that it will be done later. Around 5 pm two phone numbers were called to EEG department [phone] and [phone]). No answer on both phoned.  HD was called and asked if any luck to reach EEG tech. I was told HD will be done tomorrow. MDs were, notified and aware (Dr. Candiss Norse and Dr. Jonnie Finner.)  Looking through the notes EEG Lab tech note states that he assessed the patient at 3:53 pm. No communication was provided to the nurse or HD.  At this time, no changes with the patient, VS stable.HD will be performed tomorrow, per Dr. Jonnie Finner

## 2021-07-01 NOTE — Progress Notes (Signed)
PROGRESS NOTE        PATIENT DETAILS Name: Christine Gibson Age: 70 y.o. Sex: female Date of Birth: 20-Jan-1951 Admit Date: 06/26/2021 Admitting Physician Etta Quill, DO POE:UMPNT, Alyson Locket, NP  Brief Narrative: Patient is a 70 y.o. female ESRD on HD TTS, CLL, DM-2, HTN, HLD, recent CVA in July-presented to the hospital with confusion and low-grade fever.  Significant events: 9/5>> admit-for evaluation of confusion-MRI brain with acute/subacute right frontal hemorrhage/contusion. 9/7>>  witnessed seizure by nursing staff  Significant studies: 9/5>> CXR: Right midlung infiltrate. 9/6>> MRI brain: Anterior/inferior right frontal lobe-likely contusion. 9/6>> EEG: No seizures-moderate diffuse encephalopathy 9/6>> Serum SARS-COV-2 antibody IgG: Not detected. 9/8>> EEG: Generalized periodic discharges-ictal-interictal continuum with high potential for seizures  Antimicrobial therapy: Vancomycin: 9/5 x 1 Cefepime: 9/5>> 9/7 Zithromax: 9/5>> 9/7 Unasyn: 9/8>>  Microbiology data: 5/25>> COVID screening test positive at outside facility (per prior records) 7/19>> COVID PCR: Positive (CT value 24.4) 9/05>> COVID PCR: Positive (CT value 20.4) 9/05>> blood culture: 1/2 staff epidermidis (likely a contaminant)  9/07>> blood culture: No growth   Procedures : None  Consults: Renal Neurology Neurosurgery Palliative care  DVT Prophylaxis : SCDs Start: 06/26/21 2226   Subjective:  Patient in bed appears to be in no distress, she is awake but confused, she does have some expressive aphasia and tends to staying on several different times however denies any headache or chest pain.  Assessment/Plan:  Acute metabolic encephalopathy: Mental status continues to wax and-but mostly confused-perseverating at times.  Multifactorial etiology-from recent CVA/contusion/seizures/COVID-19 infection/aspiration pneumonia.  Her prognosis is very poor-palliative care  following-and engaging with family.    Acute/subacute right frontal lobe contusion: Recommendations from neurosurgery are to continue supportive care.  From my conversation with patient's spouse apparently patient did fall and hit her head sometime in early August.    New onset seizures: Likely due to recent CVA/frontal lobe contusion seen on MRI.  Started on Keppra after discussion with neurology on 9/8.  Repeat EEG on 9/8 with abnormal waves that places the patient at high risk for seizures-neurology planning LTM EEG.  Aspiration pneumonia: Continue empiric antimicrobial therapy.  Her CT/chest x-ray imaging is more consistent with aspiration pneumonia rather than COVID-19 pneumonia.  No longer on cefepime (due to concern that it may reduce seizure threshold and contribute to encephalopathy)-switched to Unasyn instead.  Appreciate SLP evaluation.   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  Review of records from [facility] that she received a dose of Evusheld in January 2022.  Unclear whether she was still taking zanubrutinib prior to this hospitalization as well.  She received a monoclonal antibody infusion on 9/6.  ESRD: On HD TTS-nephrology following for HD needs.  Chronic lymphocytic leukemia with anemia/thrombocytopenia: She follows with oncology at [facility] last oncology note on 04/07/2021-plans were to stop zanubrutinib and initiate hospice care.   Anemia: Likely multifactorial due to CLL and ESRD.  Transfused 1 unit of PRBC on 9/7.  Follow CBC.    Thrombocytopenia: Likely due to CLL-platelet count fluctuating-  Not sure if any further platelet transfusion is going to be beneficial given that she  appears to have advanced  CLL (transfused 1 unit of pheresis platelets on 9/6).  Follow CBC periodically.  CVA: Hold antiplatelets due to hemorrhage/contusion seen on MRI brain.  HTN: BP reasonable-continue amlodipine-should improve further with HD.  Coreg/losartan on hold.    Hard of hearing  Palliative care: Long discussion with patient's spouse over the phone on 9/9-he had a long meeting with palliative care and Dr. Jonnie Finner on 9/8.  He seems to be very understanding that patient will likely continue to deteriorate-and acknowledges that she may be suffering.  After extensive discussion-he was agreeable to a DNR order.  I have asked him to consider stopping HD and perhaps transition to full comfort measures/residential hospice.  DM-2 (A1c 6.0): Diet controlled at home-start SSI and follow  CBG (last 3)  Recent Labs    06/30/21 2108 07/01/21 0826 07/01/21 1226  GLUCAP 84 77 131*    Diet: Diet Order             DIET DYS 3 Room service appropriate? Yes with Assist; Fluid consistency: Thin  Diet effective now                    Code Status: Full code   Family Communication: Spoke with Spouse Christine [phone]-over phone on 9/9  Disposition Plan: Status is: Inpatient  Remains inpatient appropriate because:Inpatient level of care appropriate due to severity of illness  Dispo: The patient is from: Home              Anticipated d/c is to: Home              Patient currently is not medically stable to d/c.   Difficult to place patient No    Barriers to Discharge: Encephalopathic-recurrent COVID-19 infection-not yet stable for discharge-see above documentation.  Antimicrobial agents: Anti-infectives (From admission, onward)    Start     Dose/Rate Route Frequency Ordered Stop   06/29/21 2200  Ampicillin-Sulbactam (UNASYN) 3 g in sodium chloride 0.9 % 100 mL IVPB        3 g 200 mL/hr over 30 Minutes Intravenous Every 24 hours 06/29/21 1424     06/27/21 2100  ceFEPIme (MAXIPIME) 1 g  in sodium chloride 0.9 % 100 mL IVPB  Status:  Discontinued        1 g 200 mL/hr over 30 Minutes Intravenous Every 24 hours 06/26/21 1949 06/29/21 0749   06/26/21 2230  azithromycin (ZITHROMAX) 500 mg in sodium chloride 0.9 % 250 mL IVPB  Status:  Discontinued        500 mg 250 mL/hr over 60 Minutes Intravenous Every 24 hours 06/26/21 2221 06/29/21 0749   06/26/21 1930  vancomycin (VANCOCIN) IVPB 1000 mg/200 mL premix        1,000 mg 200 mL/hr over 60 Minutes Intravenous  Once 06/26/21 1846 06/26/21 2052   06/26/21 1900  ceFEPIme (MAXIPIME) 2 g in sodium chloride 0.9 % 100 mL IVPB        2 g 200 mL/hr over 30 Minutes Intravenous  Once 06/26/21 1846 06/26/21 1937   06/26/21 1845  vancomycin variable dose per unstable renal function (pharmacist dosing)  Status:  Discontinued         Does not apply See admin instructions 06/26/21 1846 06/26/21 2221        Time spent: 35 minutes-Greater than 50% of this time was spent in counseling, explanation of diagnosis, planning of further management, and coordination of care.  MEDICATIONS: Scheduled Meds:  sodium chloride  Intravenous Once   amLODipine  10 mg Oral Daily   carvedilol  12.5 mg Oral BID WC   Chlorhexidine Gluconate Cloth  6 each Topical Q0600   doxercalciferol  2 mcg Intravenous Q T,Th,Sa-HD   insulin aspart  0-6 Units Subcutaneous TID WC   Continuous Infusions:  ampicillin-sulbactam (UNASYN) IV Stopped (06/30/21 2135)   levETIRAcetam Stopped (06/29/21 1838)   levETIRAcetam 500 mg (07/01/21 1106)   PRN Meds:.acetaminophen **OR** acetaminophen, diphenhydrAMINE, hydrALAZINE, LORazepam, morphine injection, ondansetron **OR** ondansetron (ZOFRAN) IV   PHYSICAL EXAM: Vital signs: Vitals:   07/01/21 0823 07/01/21 1110 07/01/21 1200 07/01/21 1224  BP: (!) 181/78 (!) 165/81 (!) 173/76 (!) 176/76  Pulse: 85 90 82 89  Resp: 19 18 17 16   Temp: 98 F (36.7 C)   98.4 F (36.9 C)  TempSrc: Oral   Axillary  SpO2: 99% 99% 98% 98%   Weight:       Filed Weights   06/29/21 0500 06/30/21 0500 07/01/21 0500  Weight: 40.9 kg 39 kg 38.8 kg   Body mass index is 17.28 kg/m.   Awake but not alert ++ expressive aphasia [place].AT,PERRAL Supple Neck,No JVD, No cervical lymphadenopathy appriciated.  Symmetrical Chest wall movement, Good air movement bilaterally, CTAB RRR,No Gallops, Rubs or new Murmurs, No Parasternal Heave +ve B.Sounds, Abd Soft, No tenderness, No organomegaly appriciated, No rebound - guarding or rigidity. No Cyanosis, Clubbing or edema, No new Rash or bruise   I have personally reviewed following labs and imaging studies  LABORATORY DATA: CBC: Recent Labs  Lab 06/26/21 1800 06/27/21 0816 06/27/21 1317 06/28/21 0108 06/28/21 1215 06/29/21 0307  WBC 4.0 4.3 3.8* 2.3*  --  4.7  NEUTROABS 0.9*  --   --   --   --   --   HGB 10.6* 10.6* 9.9* 7.0* 12.7 13.7  HCT 35.5* 34.8* 32.9* 22.9* 40.4 42.9  MCV 91.3 89.7 89.9 90.5  --  85.6  PLT 42* 47* 43* 28*  --  41*    Basic Metabolic Panel: Recent Labs  Lab 06/26/21 1800 06/27/21 0816 06/28/21 0315 06/29/21 0558  NA 142 143 143 140  K 4.5 4.6 4.7 3.7  CL 103 105 103 101  CO2 25 24 24 25   GLUCOSE 134* 91 83 80  BUN 35* 40* 44* 17  CREATININE 6.50* 7.10* 7.79* 5.00*  CALCIUM 8.3* 8.3* 8.3* 7.8*  MG  --   --   --  1.7  PHOS  --   --  8.1* 4.6    GFR: Estimated Creatinine Clearance: 6.3 mL/min (A) (by C-G formula based on SCr of 5 mg/dL (H)).  Liver Function Tests: Recent Labs  Lab 06/26/21 1800 06/27/21 0816 06/28/21 0315 06/29/21 0558  AST 26 21  --   --   ALT 12 12  --   --   ALKPHOS 53 50  --   --   BILITOT 0.5 0.7  --   --   PROT 4.6* 4.8*  --   --   ALBUMIN 2.4* 2.4* 2.3* 2.3*   No results for input(s): LIPASE, AMYLASE in the last 168 hours. Recent Labs  Lab 06/26/21 2204  AMMONIA 34    Coagulation Profile: Recent Labs  Lab 06/26/21 1800  INR 1.2    Cardiac Enzymes: No results for input(s): CKTOTAL, CKMB,  CKMBINDEX, TROPONINI in the last 168 hours.  BNP (last 3 results) No results for input(s): PROBNP in the last 8760 hours.  Lipid Profile: No results for input(s): CHOL,  HDL, LDLCALC, TRIG, CHOLHDL, LDLDIRECT in the last 72 hours.  Thyroid Function Tests: Recent Labs    06/29/21 1049  TSH 2.400    Anemia Panel: Recent Labs    06/29/21 1049  VITAMINB12 3,204*    Urine analysis:    Component Value Date/Time   COLORURINE STRAW (A) 01/12/2019 1130   APPEARANCEUR CLEAR 01/12/2019 1130   LABSPEC 1.007 01/12/2019 1130   PHURINE 7.0 01/12/2019 1130   GLUCOSEU 150 (A) 01/12/2019 1130   HGBUR NEGATIVE 01/12/2019 1130   BILIRUBINUR NEGATIVE 01/12/2019 1130   KETONESUR NEGATIVE 01/12/2019 1130   PROTEINUR >=300 (A) 01/12/2019 1130   NITRITE NEGATIVE 01/12/2019 1130   LEUKOCYTESUR NEGATIVE 01/12/2019 1130    Sepsis Labs: Lactic Acid, Venous    Component Value Date/Time   LATICACIDVEN 1.1 06/26/2021 2204     RADIOLOGY STUDIES/RESULTS: EEG adult  Result Date: 06/30/2021 Lora Havens, MD     06/30/2021  9:44 AM Patient Name: Christine Gibson MRN: [id number] Epilepsy Attending: Lora Havens Referring Physician/Provider: Dr Harrold Donath Date: 06/29/2021 Duration: 23.16 mins Patient history: 70 yo female who is chronically ill with ESRD and CLL, multiple other comorbidities as listed in her PMHx, presenting 2 days ago with low grade fever and confusion. She had a seizure yesterday with no previously known history of seizures. Her risk factors for seizures are prior CVA and contusion on MRIb. EEG to evaluate for seizure. Level of alertness: Awake AEDs during EEG study: LEV Technical aspects: This EEG study was done with scalp electrodes positioned according to the 10-20 International system of electrode placement. Electrical activity was acquired at a sampling rate of 500Hz  and reviewed with a high frequency filter of 70Hz  and a low frequency filter of 1Hz . EEG data were recorded  continuously and digitally stored. Description: EEG showed continuous generalized 3 to 6 Hz theta-delta slowing. Generalized periodic discharges with triphasic morphology at 1.5-2 Hz. Hyperventilation and photic stimulation were not performed.   ABNORMALITY - Periodic discharges with triphasic morphology, generalized ( GPDs) - Continuous slow, generalized IMPRESSION: This study showed generalized periodic discharges at 1.5 to 2 Hz which is on the ictal-interictal continuum with high potential for seizures.  Additionally there is moderate diffuse encephalopathy, nonspecific etiology but could be secondary to cefepime toxicity, renal dysfunction, hepatic dysfunction, hyperammonemia.  No seizures were seen throughout the recording. If suspicion for ictal-interictal activity persists, please consider long-term EEG monitoring. Priyanka Barbra Sarks   Overnight EEG with video  Result Date: 07/01/2021 Lora Havens, MD     07/01/2021  9:52 AM Patient Name: Christine Gibson MRN: [id number] Epilepsy Attending: Lora Havens Referring Physician/Provider: Clance Boll, NP Duration: 06/30/2021 1156 to 07/01/2021 1000  Patient history: 70 yo female who is chronically ill with ESRD and CLL, multiple other comorbidities as listed in her PMHx, presenting 2 days ago with low grade fever and confusion. She had a seizure yesterday with no previously known history of seizures. Her risk factors for seizures are prior CVA and contusion on MRIb. EEG to evaluate for seizure.  Level of alertness: Awake  AEDs during EEG study: LEV  Technical aspects: This EEG study was done with scalp electrodes positioned according to the 10-20 International system of electrode placement. Electrical activity was acquired at a sampling rate of 500Hz  and reviewed with a high frequency filter of 70Hz  and a low frequency filter of 1Hz . EEG data were recorded continuously and digitally stored.  Description: EEG showed continuous generalized 3 to 6 Hz  theta-delta slowing. Generalized periodic discharges with triphasic morphology at 1.5-2 Hz were noted predominantly when awake/stimulated. These gradually improved in frequency to 0.5-1hz .  Hyperventilation and photic stimulation were not performed.    ABNORMALITY - Periodic discharges with triphasic morphology, generalized ( GPDs) - Continuous slow, generalized  IMPRESSION: This study showed generalized periodic discharges which were initially at at 1.5 to 2 Hz and gradually improved to 0.5-1hz , predominantly when awake/stimulated. The morphology of discharges, reactivity to stimulation and improvement after discontinuing cefepime is most likely indicative of toxic-metabolic etiology.  Additionally there is moderate diffuse encephalopathy, nonspecific etiology but most likely secondary to cefepime toxicity, renal dysfunction.  No seizures were seen throughout the recording. EEG appears to be improving compared to previous day.  Lora Havens     LOS: 5 days   Signature  Lala Lund M.D on 07/01/2021 at 1:25 PM   -  To page go to [email or web address]

## 2021-07-02 DIAGNOSIS — N186 End stage renal disease: Secondary | ICD-10-CM | POA: Diagnosis not present

## 2021-07-02 DIAGNOSIS — R531 Weakness: Secondary | ICD-10-CM | POA: Diagnosis not present

## 2021-07-02 DIAGNOSIS — G9340 Encephalopathy, unspecified: Secondary | ICD-10-CM | POA: Diagnosis not present

## 2021-07-02 DIAGNOSIS — Z992 Dependence on renal dialysis: Secondary | ICD-10-CM | POA: Diagnosis not present

## 2021-07-02 DIAGNOSIS — R4182 Altered mental status, unspecified: Secondary | ICD-10-CM | POA: Diagnosis not present

## 2021-07-02 DIAGNOSIS — R569 Unspecified convulsions: Secondary | ICD-10-CM | POA: Diagnosis not present

## 2021-07-02 DIAGNOSIS — N250 Renal osteodystrophy: Secondary | ICD-10-CM | POA: Diagnosis not present

## 2021-07-02 DIAGNOSIS — I120 Hypertensive chronic kidney disease with stage 5 chronic kidney disease or end stage renal disease: Secondary | ICD-10-CM | POA: Diagnosis not present

## 2021-07-02 DIAGNOSIS — J189 Pneumonia, unspecified organism: Secondary | ICD-10-CM | POA: Diagnosis not present

## 2021-07-02 DIAGNOSIS — D631 Anemia in chronic kidney disease: Secondary | ICD-10-CM | POA: Diagnosis not present

## 2021-07-02 LAB — RENAL FUNCTION PANEL
Albumin: 2.4 g/dL — ABNORMAL LOW (ref 3.5–5.0)
Anion gap: 18 — ABNORMAL HIGH (ref 5–15)
BUN: 21 mg/dL (ref 8–23)
CO2: 23 mmol/L (ref 22–32)
Calcium: 9.2 mg/dL (ref 8.9–10.3)
Chloride: 101 mmol/L (ref 98–111)
Creatinine, Ser: 6.74 mg/dL — ABNORMAL HIGH (ref 0.44–1.00)
GFR, Estimated: 6 mL/min — ABNORMAL LOW (ref >60)
Glucose, Bld: 91 mg/dL (ref 70–99)
Phosphorus: 5.1 mg/dL — ABNORMAL HIGH (ref 2.5–4.6)
Potassium: 3.6 mmol/L (ref 3.5–5.1)
Sodium: 142 mmol/L (ref 135–145)

## 2021-07-02 LAB — CBC
HCT: 48 % — ABNORMAL HIGH (ref 36.0–46.0)
Hemoglobin: 14.9 g/dL (ref 12.0–15.0)
MCH: 26.5 pg (ref 26.0–34.0)
MCHC: 31 g/dL (ref 30.0–36.0)
MCV: 85.4 fL (ref 80.0–100.0)
Platelets: 55 10*3/uL — ABNORMAL LOW (ref 150–400)
RBC: 5.62 MIL/uL — ABNORMAL HIGH (ref 3.87–5.11)
RDW: 17.8 % — ABNORMAL HIGH (ref 11.5–15.5)
WBC: 16.4 10*3/uL — ABNORMAL HIGH (ref 4.0–10.5)
nRBC: 0.3 % — ABNORMAL HIGH (ref 0.0–0.2)

## 2021-07-02 LAB — GLUCOSE, CAPILLARY
Glucose-Capillary: 114 mg/dL — ABNORMAL HIGH (ref 70–99)
Glucose-Capillary: 99 mg/dL (ref 70–99)
Glucose-Capillary: 87 mg/dL (ref 70–99)
Glucose-Capillary: 84 mg/dL (ref 70–99)

## 2021-07-02 MED ORDER — HYDRALAZINE HCL 50 MG PO TABS
50.0000 mg | ORAL_TABLET | Freq: Three times a day (TID) | ORAL | Status: DC
  Administered 2021-07-02 – 2021-07-20 (×43): 50 mg via ORAL
  Filled 2021-07-02 (×46): qty 1

## 2021-07-02 NOTE — Procedures (Signed)
Patient Name: Christine Gibson  MRN: [id number]  Epilepsy Attending: Lora Havens  Referring Physician/Provider: Clance Boll, NP Duration: 07/01/2021 1156 to 07/02/2021 2103   Patient history: 70 yo female who is chronically ill with ESRD and CLL, multiple other comorbidities as listed in her PMHx, presenting 2 days ago with low grade fever and confusion. She had a seizure yesterday with no previously known history of seizures. Her risk factors for seizures are prior CVA and contusion on MRIb. EEG to evaluate for seizure.    Level of alertness: Awake   AEDs during EEG study: LEV   Technical aspects: This EEG study was done with scalp electrodes positioned according to the 10-20 International system of electrode placement. Electrical activity was acquired at a sampling rate of 500Hz  and reviewed with a high frequency filter of 70Hz  and a low frequency filter of 1Hz . EEG data were recorded continuously and digitally stored.    Description: EEG showed continuous generalized 3 to 6 Hz theta-delta slowing. Generalized periodic discharges with triphasic morphology at 0.5-1  Hz were noted predominantly when awake/stimulated. Hyperventilation and photic stimulation were not performed.      ABNORMALITY - Periodic discharges with triphasic morphology, generalized ( GPDs) - Continuous slow, generalized   IMPRESSION: This study showed generalized periodic discharges at 0.5-1hz , predominantly when awake/stimulated. The morphology of discharges, reactivity to stimulation is most likely indicative of toxic-metabolic etiology.  Additionally there is moderate diffuse encephalopathy, nonspecific etiology but most likely secondary to cefepime toxicity, renal dysfunction.  No seizures were seen throughout the recording.   Charolette Bultman Barbra Sarks

## 2021-07-02 NOTE — Progress Notes (Signed)
Neurology Progress Note  Brief HPI: 70 y.o. female with PMHx of CVA 04/2021, COVID, HTN, ESRD on HD TTS, type 2 DM, CLL, HLD, on home hospice who presented from home 9/5 for evaluation of confusion and low grade fever after missed HD appointments and was found to have aspiration PNA. She was started on abx and admitted. MRI brain revealed a small acute to subacute right frontal hemorrhage that was felt to be consistent with a contusion with a reported fall with head strike approximately 1 month PTA. She had a witnessed 2 minute GTC seizure 9/7 after HD and was started on renally dosed Keppra.   Subjective: No acute overnight events, patient assessed while receiving HD this morning LTM EEG discontinued overnight  Exam: Vitals:   07/02/21 0700 07/02/21 0800  BP:  (!) 161/69  Pulse: 81 85  Resp: 18 17  Temp:  98.8 F (37.1 C)  SpO2: 97% 96%   Gen: Frail elderly female laying in hospital bed, receiving hemodialysis. She has restless movements with constant moaning and saying "ow" and "ouch" but does not provide further information regarding pain location, severity, onset, or characteristics at this time.  Resp: non-labored breathing, no respiratory distress on room air Abd: soft, non-distended, non-tender to palpation throughout  Neuro: Mental Status: Awake with restless movements and moaning. She is able to state her name but does not answer further orientation questions. She is unable to provide a clear or coherent history of present illness.  She is able to name provider's thumb and then perseverates on "thumb" throughout the rest of the assessment. She does not provide any further speech outside of moaning and answering each question with "thumb" repetitively.  No neglect is noted.  Intermittently and inconsistently followed simple commands- squeeze examiner's fingers and show thumb on the left. She did not follow other commands.  Cranial Nerves: PERRL, will fixate and track examiner, face  is symmetric resting and with movement, hearing is intact to voice, phonation normal, does not protrude tongue on command.  Motor: Moves all extremities spontaneously but does not follow commands for formal strength evaluation. She will temporarily elevate extremities with antigravity movement but despite constant coaching, she loses attention and drops them. She is able to kick both legs out, hold each leg against gravity intermittently, and draw them up spontaneously without asymmetry noted 4/5 strength.  Tone is normal, bulk is decreased throughout.  Sensory: Increases movement of each extremity with manipulation, she does not provide information regarding symmetry or sensation to light touch.  Coordination: She is unable to follow commands.  Gait: Deferred for patient safety  Pertinent Labs: CBC    Component Value Date/Time   WBC 4.7 06/29/2021 0307   RBC 5.01 06/29/2021 0307   HGB 13.7 06/29/2021 0307   HCT 42.9 06/29/2021 0307   PLT 41 (L) 06/29/2021 0307   MCV 85.6 06/29/2021 0307   MCH 27.3 06/29/2021 0307   MCHC 31.9 06/29/2021 0307   RDW 17.3 (H) 06/29/2021 0307   LYMPHSABS 2.4 06/26/2021 1800   MONOABS 0.6 06/26/2021 1800   EOSABS 0.1 06/26/2021 1800   BASOSABS 0.0 06/26/2021 1800   CMP     Component Value Date/Time   NA 140 06/29/2021 0558   K 3.7 06/29/2021 0558   CL 101 06/29/2021 0558   CO2 25 06/29/2021 0558   GLUCOSE 80 06/29/2021 0558   BUN 17 06/29/2021 0558   CREATININE 5.00 (H) 06/29/2021 0558   CREATININE 4.72 (H) 01/06/2021 1615   CALCIUM 7.8 (  L) 06/29/2021 0558   PROT 4.8 (L) 06/27/2021 0816   ALBUMIN 2.3 (L) 06/29/2021 0558   AST 21 06/27/2021 0816   ALT 12 06/27/2021 0816   ALKPHOS 50 06/27/2021 0816   BILITOT 0.7 06/27/2021 0816   GFRNONAA 9 (L) 06/29/2021 0558   GFRAA 12 (L) 01/12/2019 0908   Imaging Reviewed: MRI brain wo contrast 06/27/2021: 1. Motion degraded exam. 2. 2 cm focus of FLAIR and T1 hyperintensity involving the  anterior/inferior right frontal lobe, new as compared to recent MRI from 05/09/2021. In comparison with prior CT from 06/26/2021, there appears to be a small extra-axial hemorrhage and probable parenchymal contusion at this location. This is likely subacute in nature without significant regional mass effect or edema. Correlation with history for possible recent trauma suggested. Appearance would be atypical and unusual for intracranial spread of infection from adjacent paranasal sinus disease, which would be the primary differential consideration. Short interval follow-up MRI in 2-3 months to ensure these changes resolve is suggested. If there is concern for possible intracranial infection, then correlation with dedicated LP and CSF studies could be performed as warranted. 3. No other acute intracranial abnormality. 4. 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 5. Extensive pan sinusitis with large bilateral mastoid effusions.  EEG 07/01/2021  11:56 - 2103: "This study showed generalized periodic discharges at 0.5-1hz , predominantly when awake/stimulated. The morphology of discharges, reactivity to stimulation is most likely indicative of toxic-metabolic etiology.  Additionally there is moderate diffuse encephalopathy, nonspecific etiology but most likely secondary to cefepime toxicity, renal dysfunction.  No seizures were seen throughout the recording."  Overnight EEG 06/30/2021 - 07/01/2021: "This study showed generalized periodic discharges which were initially at at 1.5 to 2 Hz and gradually improved to 0.5-1hz , predominantly when awake/stimulated. The morphology of discharges, reactivity to stimulation and improvement after discontinuing cefepime is most likely indicative of toxic-metabolic etiology.  Additionally there is moderate diffuse encephalopathy, nonspecific etiology but most likely secondary to cefepime toxicity, renal  dysfunction.  No seizures were seen throughout the recording. EEG appears to be improving compared to previous day."   Routine EEG 06/30/2021: "This study showed generalized periodic discharges at 1.5 to 2 Hz which is on the ictal-interictal continuum with high potential for seizures.  Additionally there is moderate diffuse encephalopathy, nonspecific etiology but could be secondary to cefepime toxicity, renal dysfunction, hepatic dysfunction, hyperammonemia.  No seizures were seen throughout the recording. If suspicion for ictal-interictal activity persists, please consider long-term EEG monitoring."  Assessment: 70 yo female who is chronically ill with ESRD and CLL, multiple other comorbidities as listed in her PMHx, presenting 06/26/2021 with low grade fever and confusion. Found to have PNA and is being treated with antibiotics. She had a seizure 06/28/2021 following HD with no previously known history of seizures. Her risk factors for seizures are prior CVA and contusion on MRI brain. She was loaded with Keppra and is on renally dosed Keppra daily and after HD on HD days.  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  - 9/9 EEG + for GPEDS which can be caused by Cefepime which has been discontinued in favor of Unasyn.  - Overnight EEG (07/01/2021 1156 to 07/02/2021 2103) showed generalized periodic discharges at 0.5-1hz , predominantly when awake/stimulated. The morphology of discharges, reactivity to stimulation is  most likely indicative of toxic-metabolic etiology.  Additionally there is moderate diffuse encephalopathy, nonspecific etiology but most likely secondary to cefepime toxicity, renal dysfunction.  No seizures were seen throughout the recording. - Previous LTM EEG  (06/30/2021 1156 to 07/01/2021 1156) revealed GPEDS with gradual improvement overnight that occur mostly while patient is awake or stimulated, however, the morphology of discharges is felt to be most likely indicative of a toxic-metabolic etiology with improvement following discontinuation of Cefepime in favor of Unasyn. EEG also revealed moderate diffuse encephalopathy of nonspecific etiology but without seizures. EEG is noted to be improving from initial EEG. LTM EEG discontinued without further evidence of seizures.  - She is currently at max dose of Keppra for renal adjustment, will continue renally dosed Keppra at current dose without further evidence of seizures. Unable to obtain contrasted MRI brain imaging due to patient ESRD.  -Palliative family meeting 9/7. Patient is still a full code with full scope of treatment.   Impression:  -New onset seizure activity -GPEDs on EEG, possibly secondary to Cefepime.  -Multifactorial encephalopathy, improved today.  -Brain contusion, query fall last month.  -On Hospice at home.  Recommendations: -LTM discontinued overnight without evidence of ongoing seizure activity -Continue inpatient seizure precautions -Ativan 1 mg IV for seizure over 5 mins and call Neurology.  -Continue Keppra 500 mg IV daily as well as 250mg  IV after all HD sessions.  -Avoid medications on Beer's list for the elderly -Continue management of infection and metabolic derangements per primary team  Anibal Henderson, AGACNP-BC [facility] [phone]  Electronically signed: Dr. Kerney Elbe

## 2021-07-02 NOTE — Progress Notes (Addendum)
PROGRESS NOTE        PATIENT DETAILS Name: Christine Gibson Age: 70 y.o. Sex: female Date of Birth: 1951-05-04 Admit Date: 06/26/2021 Admitting Physician Etta Quill, DO NUU:[id number], Alyson Locket, NP  Brief Narrative: Patient is a 70 y.o. female ESRD on HD TTS, CLL, DM-2, HTN, HLD, recent CVA in July-presented to the hospital with confusion and low-grade fever.  Significant events: 9/5>> admit-for evaluation of confusion-MRI brain with acute/subacute right frontal hemorrhage/contusion. 9/7>>  witnessed seizure by nursing staff  Significant studies: 9/5>> CXR: Right midlung infiltrate. 9/6>> MRI brain: Anterior/inferior right frontal lobe-likely contusion. 9/6>> EEG: No seizures-moderate diffuse encephalopathy 9/6>> Serum SARS-COV-2 antibody IgG: Not detected. 9/8>> EEG: Generalized periodic discharges-ictal-interictal continuum with high potential for seizures  Antimicrobial therapy: Vancomycin: 9/5 x 1 Cefepime: 9/5>> 9/7 Zithromax: 9/5>> 9/7 Unasyn: 9/8>>  Microbiology data: 5/25>> COVID screening test positive at outside facility (per prior records) 7/19>> COVID PCR: Positive (CT value 24.4) 9/05>> COVID PCR: Positive (CT value 20.4) 9/05>> blood culture: 1/2 staff epidermidis (likely a contaminant)  9/07>> blood culture: No growth   Procedures : None  Consults: Renal Neurology Neurosurgery Palliative care  DVT Prophylaxis : SCDs Start: 06/26/21 2226   Subjective:  Patient in bed appears to be in no distress, unreliable historian due to underlying encephalopathy and expressive aphasia but denies any headache chest or abdominal pain.  Assessment/Plan:  Acute metabolic encephalopathy: Mental status continues to wax and-but mostly confused-perseverating at times.  Multifactorial etiology-from recent CVA/contusion/seizures/COVID-19 infection/aspiration pneumonia.  Her prognosis is very poor-palliative care following-and engaging with  family.  Unfortunately husband seems to have very poor understanding of her prognosis.  She is DNR but husband not coming to terms with her extremely poor prognosis.  DW husband on 07/02/21 2pm, now understands that prognosis is poor, confirmed DNR goal keep patient comfortable.  Acute/subacute right frontal lobe contusion: Recommendations from neurosurgery are to continue supportive care.  From my conversation with patient's spouse apparently patient did fall and hit her head sometime in early August.    New onset seizures: Likely due to recent CVA/frontal lobe contusion seen on MRI.  Started on Keppra after discussion with neurology on 9/8.  Repeat EEG on 9/8 with abnormal waves that places the patient at high risk for seizures-neurology planning LTM EEG.  Chronic lymphocytic leukemia with anemia/thrombocytopenia: She follows with oncology at [facility] last oncology note on 04/07/2021-plans were to stop zanubrutinib and initiate hospice care.   Aspiration pneumonia: Continue empiric antimicrobial therapy.  Her CT/chest x-ray imaging is more consistent with aspiration pneumonia rather than COVID-19 pneumonia.  No longer on cefepime (due to concern that it may reduce seizure threshold and contribute to encephalopathy)-switched to Unasyn instead.  Appreciate SLP evaluation.   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  Review of records from [facility] that she received a dose of Evusheld in January 2022.  Unclear whether she was still taking zanubrutinib prior to this hospitalization as well.  She received a monoclonal antibody infusion on 9/6.  ESRD: On HD TTS-nephrology following for HD needs.  Anemia: Likely multifactorial due to  CLL and ESRD.  Transfused  1 unit of PRBC on 9/7.  Follow CBC.    Thrombocytopenia: Likely due to CLL-platelet count fluctuating-  Not sure if any further platelet transfusion is going to be beneficial given that she appears to have advanced CLL (transfused 1 unit of pheresis platelets on 9/6).  Follow CBC periodically.  CVA: Hold antiplatelets due to hemorrhage/contusion seen on MRI brain.  HTN: BP high on Coreg +  amlodipine-add Hydralazine.  Hard of hearing  Palliative care: Long discussion with patient's spouse over the phone on 9/9-he had a long meeting with palliative care and Dr. Jonnie Finner on 9/8.  He seems to be very understanding that patient will likely continue to deteriorate-and acknowledges that she may be suffering.  After extensive discussion-he was agreeable to a DNR order.  I have asked him to consider stopping HD and perhaps transition to full comfort measures/residential hospice.  DM-2 (A1c 6.0): Diet controlled at home-start SSI and follow  CBG (last 3)  Recent Labs    07/01/21 1722 07/01/21 2107 07/02/21 0829  GLUCAP 108* 146* 114*    Diet: Diet Order             DIET DYS 3 Room service appropriate? Yes with Assist; Fluid consistency: Thin  Diet effective now                    Code Status: DNR  Family Communication: Spoke with Spouse [name]-[phone]-over phone on 9/9  Disposition Plan: Status is: Inpatient  Remains inpatient appropriate because:Inpatient level of care appropriate due to severity of illness  Dispo: The patient is from: Home              Anticipated d/c is to: Home              Patient currently is not medically stable to d/c.   Difficult to place patient No    Barriers to Discharge: Encephalopathic-recurrent COVID-19 infection-not yet stable for discharge-see above documentation.  Antimicrobial agents: Anti-infectives (From admission, onward)    Start     Dose/Rate Route Frequency Ordered Stop   06/29/21 2200   Ampicillin-Sulbactam (UNASYN) 3 g in sodium chloride 0.9 % 100 mL IVPB        3 g 200 mL/hr over 30 Minutes Intravenous Every 24 hours 06/29/21 1424     06/27/21 2100  ceFEPIme (MAXIPIME) 1 g in sodium chloride 0.9 % 100 mL IVPB  Status:  Discontinued        1 g 200 mL/hr over 30 Minutes Intravenous Every 24 hours 06/26/21 1949 06/29/21 0749   06/26/21 2230  azithromycin (ZITHROMAX) 500 mg in sodium chloride 0.9 % 250 mL IVPB  Status:  Discontinued        500 mg 250 mL/hr over 60 Minutes Intravenous Every 24 hours 06/26/21 2221 06/29/21 0749   06/26/21 1930  vancomycin (VANCOCIN) IVPB 1000 mg/200 mL premix        1,000 mg 200 mL/hr over 60 Minutes Intravenous  Once 06/26/21 1846 06/26/21 2052   06/26/21 1900  ceFEPIme (MAXIPIME) 2 g in sodium chloride 0.9 % 100 mL IVPB        2 g 200 mL/hr over 30 Minutes Intravenous  Once 06/26/21 1846 06/26/21 1937   06/26/21 1845  vancomycin variable dose per unstable renal function (pharmacist dosing)  Status:  Discontinued         Does not apply See admin instructions 06/26/21 1846 06/26/21 2221        Time spent: 35 minutes-Greater  than 50% of this time was spent in counseling, explanation of diagnosis, planning of further management, and coordination of care.  MEDICATIONS: Scheduled Meds:  sodium chloride   Intravenous Once   amLODipine  10 mg Oral Daily   carvedilol  12.5 mg Oral BID WC   Chlorhexidine Gluconate Cloth  6 each Topical Q0600   doxercalciferol  2 mcg Intravenous Q T,Th,Sa-HD   insulin aspart  0-6 Units Subcutaneous TID WC   Continuous Infusions:  ampicillin-sulbactam (UNASYN) IV Stopped (07/01/21 2215)   levETIRAcetam Stopped (06/29/21 1838)   levETIRAcetam 500 mg (07/01/21 1106)   PRN Meds:.acetaminophen **OR** acetaminophen, diphenhydrAMINE, hydrALAZINE, LORazepam, morphine injection, ondansetron **OR** ondansetron (ZOFRAN) IV   PHYSICAL EXAM: Vital signs: Vitals:   07/02/21 1000 07/02/21 1030 07/02/21 1100  07/02/21 1130  BP: (!) 183/90 136/88 (!) 148/84 (!) 148/100  Pulse: 100 (!) 111 (!) 108 (!) 105  Resp:      Temp:      TempSrc:      SpO2:      Weight:       Filed Weights   07/01/21 0500 07/02/21 0455 07/02/21 0850  Weight: 38.8 kg 39.9 kg 39.2 kg   Body mass index is 17.45 kg/m.   Awake but not alert ++ expressive aphasia Outlook.AT,PERRAL Supple Neck,No JVD, No cervical lymphadenopathy appriciated.  Symmetrical Chest wall movement, Good air movement bilaterally, CTAB RRR,No Gallops, Rubs or new Murmurs, No Parasternal Heave +ve B.Sounds, Abd Soft, No tenderness, No organomegaly appriciated, No rebound - guarding or rigidity. No Cyanosis, Clubbing or edema, No new Rash or bruise    I have personally reviewed following labs and imaging studies  LABORATORY DATA: CBC: Recent Labs  Lab 06/26/21 1800 06/27/21 0816 06/27/21 1317 06/28/21 0108 06/28/21 1215 06/29/21 0307  WBC 4.0 4.3 3.8* 2.3*  --  4.7  NEUTROABS 0.9*  --   --   --   --   --   HGB 10.6* 10.6* 9.9* 7.0* 12.7 13.7  HCT 35.5* 34.8* 32.9* 22.9* 40.4 42.9  MCV 91.3 89.7 89.9 90.5  --  85.6  PLT 42* 47* 43* 28*  --  41*    Basic Metabolic Panel: Recent Labs  Lab 06/26/21 1800 06/27/21 0816 06/28/21 0315 06/29/21 0558 07/02/21 1000  NA 142 143 143 140 142  K 4.5 4.6 4.7 3.7 3.6  CL 103 105 103 101 101  CO2 25 24 24 25 23   GLUCOSE 134* 91 83 80 91  BUN 35* 40* 44* 17 21  CREATININE 6.50* 7.10* 7.79* 5.00* 6.74*  CALCIUM 8.3* 8.3* 8.3* 7.8* 9.2  MG  --   --   --  1.7  --   PHOS  --   --  8.1* 4.6 5.1*    GFR: Estimated Creatinine Clearance: 4.7 mL/min (A) (by C-G formula based on SCr of 6.74 mg/dL (H)).  Liver Function Tests: Recent Labs  Lab 06/26/21 1800 06/27/21 0816 06/28/21 0315 06/29/21 0558 07/02/21 1000  AST 26 21  --   --   --   ALT 12 12  --   --   --   ALKPHOS 53 50  --   --   --   BILITOT 0.5 0.7  --   --   --   PROT 4.6* 4.8*  --   --   --   ALBUMIN 2.4* 2.4* 2.3* 2.3* 2.4*    No results for input(s): LIPASE, AMYLASE in the last 168 hours. Recent Labs  Lab 06/26/21 2204  AMMONIA 34    Coagulation Profile: Recent Labs  Lab 06/26/21 1800  INR 1.2    Cardiac Enzymes: No results for input(s): CKTOTAL, CKMB, CKMBINDEX, TROPONINI in the last 168 hours.  BNP (last 3 results) No results for input(s): PROBNP in the last 8760 hours.  Lipid Profile: No results for input(s): CHOL, HDL, LDLCALC, TRIG, CHOLHDL, LDLDIRECT in the last 72 hours.  Thyroid Function Tests: No results for input(s): TSH, T4TOTAL, FREET4, T3FREE, THYROIDAB in the last 72 hours.   Anemia Panel: No results for input(s): VITAMINB12, FOLATE, FERRITIN, TIBC, IRON, RETICCTPCT in the last 72 hours.   Urine analysis:    Component Value Date/Time   COLORURINE STRAW (A) 01/12/2019 1130   APPEARANCEUR CLEAR 01/12/2019 1130   LABSPEC 1.007 01/12/2019 1130   PHURINE 7.0 01/12/2019 1130   GLUCOSEU 150 (A) 01/12/2019 1130   HGBUR NEGATIVE 01/12/2019 1130   BILIRUBINUR NEGATIVE 01/12/2019 1130   KETONESUR NEGATIVE 01/12/2019 1130   PROTEINUR >=300 (A) 01/12/2019 1130   NITRITE NEGATIVE 01/12/2019 1130   LEUKOCYTESUR NEGATIVE 01/12/2019 1130    Sepsis Labs: Lactic Acid, Venous    Component Value Date/Time   LATICACIDVEN 1.1 06/26/2021 2204     RADIOLOGY STUDIES/RESULTS: Overnight EEG with video  Result Date: 07/01/2021 Lora Havens, MD     07/02/2021  9:51 AM Patient Name: ANNETH BRUNELL MRN: [id number] Epilepsy Attending: Lora Havens Referring Physician/Provider: Clance Boll, NP Duration: 06/30/2021 1156 to 07/01/2021 1156  Patient history: 70 yo female who is chronically ill with ESRD and CLL, multiple other comorbidities as listed in her PMHx, presenting 2 days ago with low grade fever and confusion. She had a seizure yesterday with no previously known history of seizures. Her risk factors for seizures are prior CVA and contusion on MRIb. EEG to evaluate for seizure.   Level of alertness: Awake  AEDs during EEG study: LEV  Technical aspects: This EEG study was done with scalp electrodes positioned according to the 10-20 International system of electrode placement. Electrical activity was acquired at a sampling rate of 500Hz  and reviewed with a high frequency filter of 70Hz  and a low frequency filter of 1Hz . EEG data were recorded continuously and digitally stored.  Description: EEG showed continuous generalized 3 to 6 Hz theta-delta slowing. Generalized periodic discharges with triphasic morphology at 1.5-2 Hz were noted predominantly when awake/stimulated. These gradually improved in frequency to 0.5-1hz .  Hyperventilation and photic stimulation were not performed.    ABNORMALITY - Periodic discharges with triphasic morphology, generalized ( GPDs) - Continuous slow, generalized  IMPRESSION: This study showed generalized periodic discharges which were initially at at 1.5 to 2 Hz and gradually improved to 0.5-1hz , predominantly when awake/stimulated. The morphology of discharges, reactivity to stimulation and improvement after discontinuing cefepime is most likely indicative of toxic-metabolic etiology.  Additionally there is moderate diffuse encephalopathy, nonspecific etiology but most likely secondary to cefepime toxicity, renal dysfunction.  No seizures were seen throughout the recording. EEG appears to be improving compared to previous day.  Lora Havens     LOS: 6 days   Signature  Lala Lund M.D on 07/02/2021 at 11:37 AM   -  To page go to [email or web address]

## 2021-07-02 NOTE — Progress Notes (Signed)
[facility] Progress Note  Subjective: seen on HD, remains confused   Vitals:   07/02/21 1212 07/02/21 1220 07/02/21 1247 07/02/21 1329  BP:  (!) 160/106 (!) 168/77 (!) 175/76  Pulse: 91  92   Resp:   20   Temp: (!) 97.5 F (36.4 C)  98.9 F (37.2 C)   TempSrc: Oral  Oral   SpO2: 95%  98%   Weight: 38.5 kg       Exam:   Lethargic, confused, nad   no jvd  Chest cta bilat  Cor reg no RG  Abd soft ntnd no ascites   Ext no LE edema   Nonfocal, confused   L arm AVG+bruit       Home meds include norvasc, lipitor, coreg, ativan prn, losartan, roxanol prn, adalat 60 qd, oxy IR prn, protonix, zoloft, Zanubrutinib tiw po, prn's    CXR - IMPRESSION: 1. New right midlung infiltrate. Questionable new right hilar enlargement. Findings may be related to pneumonia with right hilar lymphadenopathy. Short-term follow-up two views chest and/or CT recommended to exclude underlying mass at this level.    OP HD: TTS [place]  3.5h  41.5kg  400/500  3K/2.5 bath    L AVG  Hep NONE (due to low plts)  - hect 2 ug tiw  - mircera 200 q2 last 8/30     Assessment/ Plan: Aspiration PNA - RML, on IV unasyn now AMS - recent CVA, contusion, seizures, COVID-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 Not improving. Very poor prognosis. Support transition to comfort care/ hospice.  ESRD - on HD TTS.  HD today, postponed from yesterday.   Chronic COVID infection / FTT - pt was COVID + first during May 2022 admit, has been testing + repeatedly since then. Negative testing for COVID antibodies FTT - appreciate palliative efforts, working w/ family Acute/ subactue R frontal lobe contusion: per Nsurg no surg needed CLL/ chronic thrombocytopenia - f/u [facility]. Last seen in June, 2022 by [facility] and plans were to stop zanubrutinib and initiate hospice care.  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 Ca 8.3, phos 8 HTN/ volume - under dry wt slightly. No vol excess  on exam, BP's up, on home norvasc. No UF w/ HD today.        Rob Makhari Dovidio 07/02/2021, 1:32 PM   Recent Labs  Lab 06/29/21 0307 06/29/21 0558 07/02/21 1000  K  --  3.7 3.6  BUN  --  17 21  CREATININE  --  5.00* 6.74*  CALCIUM  --  7.8* 9.2  PHOS  --  4.6 5.1*  HGB 13.7  --  14.9    Inpatient medications:  sodium chloride   Intravenous Once   amLODipine  10 mg Oral Daily   carvedilol  12.5 mg Oral BID WC   Chlorhexidine Gluconate Cloth  6 each Topical Q0600   doxercalciferol  2 mcg Intravenous Q T,Th,Sa-HD   hydrALAZINE  50 mg Oral Q8H   insulin aspart  0-6 Units Subcutaneous TID WC    ampicillin-sulbactam (UNASYN) IV Stopped (07/01/21 2215)   levETIRAcetam Stopped (06/29/21 1838)   levETIRAcetam 500 mg (07/02/21 1314)   acetaminophen **OR** acetaminophen, diphenhydrAMINE, hydrALAZINE, LORazepam, morphine injection, ondansetron **OR** ondansetron (ZOFRAN) IV

## 2021-07-03 DIAGNOSIS — C9110 Chronic lymphocytic leukemia of B-cell type not having achieved remission: Secondary | ICD-10-CM | POA: Diagnosis not present

## 2021-07-03 DIAGNOSIS — I639 Cerebral infarction, unspecified: Secondary | ICD-10-CM | POA: Diagnosis not present

## 2021-07-03 DIAGNOSIS — G9340 Encephalopathy, unspecified: Secondary | ICD-10-CM | POA: Diagnosis not present

## 2021-07-03 DIAGNOSIS — U071 COVID-19: Secondary | ICD-10-CM | POA: Diagnosis not present

## 2021-07-03 LAB — BASIC METABOLIC PANEL
Anion gap: 17 — ABNORMAL HIGH (ref 5–15)
BUN: 12 mg/dL (ref 8–23)
CO2: 22 mmol/L (ref 22–32)
Calcium: 8.9 mg/dL (ref 8.9–10.3)
Chloride: 100 mmol/L (ref 98–111)
Creatinine, Ser: 4.66 mg/dL — ABNORMAL HIGH (ref 0.44–1.00)
GFR, Estimated: 10 mL/min — ABNORMAL LOW (ref >60)
Glucose, Bld: 77 mg/dL (ref 70–99)
Potassium: 3.8 mmol/L (ref 3.5–5.1)
Sodium: 139 mmol/L (ref 135–145)

## 2021-07-03 LAB — CULTURE, BLOOD (ROUTINE X 2)
Culture: NO GROWTH
Culture: NO GROWTH
Special Requests: ADEQUATE
Special Requests: ADEQUATE

## 2021-07-03 LAB — GLUCOSE, CAPILLARY
Glucose-Capillary: 78 mg/dL (ref 70–99)
Glucose-Capillary: 77 mg/dL (ref 70–99)

## 2021-07-03 MED ORDER — CHLORHEXIDINE GLUCONATE CLOTH 2 % EX PADS
6.0000 | MEDICATED_PAD | Freq: Every day | CUTANEOUS | Status: DC
  Administered 2021-07-04 – 2021-07-07 (×2): 6 via TOPICAL

## 2021-07-03 MED ORDER — OXYCODONE HCL 5 MG PO TABS
5.0000 mg | ORAL_TABLET | ORAL | Status: DC | PRN
Start: 2021-07-03 — End: 2021-07-21
  Administered 2021-07-06 – 2021-07-20 (×20): 5 mg via ORAL
  Filled 2021-07-03 (×20): qty 1

## 2021-07-03 MED ORDER — HYDROMORPHONE HCL 1 MG/ML IJ SOLN
0.5000 mg | INTRAMUSCULAR | Status: DC | PRN
  Administered 2021-07-03 – 2021-07-19 (×20): 0.5 mg via INTRAVENOUS
  Filled 2021-07-03 (×21): qty 0.5

## 2021-07-03 NOTE — Progress Notes (Signed)
[facility] Progress Note  Subjective: last HD on 9/11 with UF of 0.2 kg.  I spoke with her husband and palliative at bedside.  He is not ready yet to transition to comfort measures and still does want her to have HD.   Review of systems:  Unable to obtain 2/2 ams   Vitals:   07/02/21 1600 07/02/21 2125 07/03/21 0544 07/03/21 0825  BP: (!) 152/71 (!) 174/74 (!) 171/84 (!) 161/78  Pulse: 88   87  Resp: 18 20  (!) 115  Temp:  98.2 F (36.8 C)  98.9 F (37.2 C)  TempSrc:  Oral  Oral  SpO2: 98% 95%  99%  Weight:        Exam:  General adult female in bed, chronically ill appearing and lethargic - no acute distress HEENT normocephalic atraumatic  Neck supple trachea midline Lungs clear to auscultation bilaterally normal work of breathing at rest  Heart S1S2 no rub Abdomen soft nontender nondistended Extremities no edema  Psych - no agitation  Neuro - confused; turns head and opens eyes to name but does not answer questions Access:   L arm AVG+bruit      Home meds include norvasc, lipitor, coreg, ativan prn, losartan, roxanol prn, adalat 60 qd, oxy IR prn, protonix, zoloft, Zanubrutinib tiw po, prn's    CXR - IMPRESSION: 1. New right midlung infiltrate. Questionable new right hilar enlargement. Findings may be related to pneumonia with right hilar lymphadenopathy. Short-term follow-up two views chest and/or CT recommended to exclude underlying mass at this level.    OP HD: TTS [place]  3.5h  41.5kg  400/500  3K/2.5 bath    L AVG  Hep NONE (due to low plts)  - hect 2 ug tiw  - mircera 200 q2 last 8/30     Assessment/ Plan: Aspiration PNA - RML, on IV unasyn  AMS - recent CVA, contusion, seizures, COVID-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 Not improving. Very poor prognosis.  Nephrology has charted of support transition to comfort care/ hospice and today I discussed with her husband as well  ESRD - on HD TTS schedule usually.  last hd on 9/11 off schedule due to pt  volumes.  Continue HD as aligns with goals of care - please notify nephrology if goals do change.  Seem in flux at this time Chronic COVID infection / FTT - pt was COVID + first during May 2022 admit, has been testing + repeatedly since then. Negative testing for COVID antibodies FTT - appreciate palliative efforts, working w/ family Acute/ subactue R frontal lobe contusion: per Nsurg no surg needed CLL/ chronic thrombocytopenia - f/u [facility]. Last seen in June, 2022 by [facility] and plans were to stop zanubrutinib and initiate hospice care.  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 HTN/ volume - under dry wt slightly. No vol excess on exam, BP's up, on home norvasc and note hydralazine added   Disposition - per primary team. Appreciate primary and palliative.  Appreciate their assistance with goals of care; discussions ongoing   Recent Labs  Lab 06/29/21 0307 06/29/21 0558 07/02/21 1000 07/03/21 0118  K  --  3.7 3.6 3.8  BUN  --  17 21 12   CREATININE  --  5.00* 6.74* 4.66*  CALCIUM  --  7.8* 9.2 8.9  PHOS  --  4.6 5.1*  --   HGB 13.7  --  14.9  --    Inpatient medications:  sodium chloride   Intravenous Once   amLODipine  10 mg Oral Daily   carvedilol  12.5 mg Oral BID WC   Chlorhexidine Gluconate Cloth  6 each Topical Q0600   doxercalciferol  2 mcg Intravenous Q T,Th,Sa-HD   hydrALAZINE  50 mg Oral Q8H   insulin aspart  0-6 Units Subcutaneous TID WC    ampicillin-sulbactam (UNASYN) IV 3 g (07/02/21 2143)   levETIRAcetam Stopped (06/29/21 1838)   levETIRAcetam 500 mg (07/03/21 0909)   acetaminophen **OR** acetaminophen, diphenhydrAMINE, hydrALAZINE, HYDROmorphone (DILAUDID) injection, LORazepam, ondansetron **OR** ondansetron (ZOFRAN) IV, oxyCODONE    Claudia Desanctis, MD 07/03/2021 11:13 AM

## 2021-07-03 NOTE — Progress Notes (Signed)
[name] brought in a pair of hearing aids; a black hearing aid case and a pair of hearing aid batteries. Patient now had a total of 3 hearing aid devices, 4 hearing aid batteries and 1 black hearing aid case. 1 hearing aid is in the pink denture cup.

## 2021-07-03 NOTE — Progress Notes (Signed)
Subjective: Husband at bedside denies any other acute events.  States she has been in the hospital multiple times this year (months in total) and it usually takes her 1 to 2 weeks to improve.  ROS: Unable to obtain due to poor mental status  Examination  Vital signs in last 24 hours: Temp:  [98.2 F (36.8 C)-98.9 F (37.2 C)] 98.9 F (37.2 C) (09/12 0825) Pulse Rate:  [84-88] 87 (09/12 0825) Resp:  [18-115] 115 (09/12 0825) BP: (152-175)/(71-84) 161/78 (09/12 0825) SpO2:  [95 %-99 %] 99 % (09/12 0825)  General: lying in bed, frail looking CVS: pulse-normal rate and rhythm RS: breathing comfortably, CTAB Extremities: normal, warm Neuro: Opens eyes to verbal stimulation, did not follow commands, unable to tell me her name, called her husband "Jenny Reichmann", spontaneously moving all 4 extremities  Basic Metabolic Panel: Recent Labs  Lab 06/27/21 0816 06/28/21 0315 06/29/21 0558 07/02/21 1000 07/03/21 0118  NA 143 143 140 142 139  K 4.6 4.7 3.7 3.6 3.8  CL 105 103 101 101 100  CO2 24 24 25 23 22   GLUCOSE 91 83 80 91 77  BUN 40* 44* 17 21 12   CREATININE 7.10* 7.79* 5.00* 6.74* 4.66*  CALCIUM 8.3* 8.3* 7.8* 9.2 8.9  MG  --   --  1.7  --   --   PHOS  --  8.1* 4.6 5.1*  --     CBC: Recent Labs  Lab 06/26/21 1800 06/27/21 0816 06/27/21 1317 06/28/21 0108 06/28/21 1215 06/29/21 0307 07/02/21 1000  WBC 4.0 4.3 3.8* 2.3*  --  4.7 16.4*  NEUTROABS 0.9*  --   --   --   --   --   --   HGB 10.6* 10.6* 9.9* 7.0* 12.7 13.7 14.9  HCT 35.5* 34.8* 32.9* 22.9* 40.4 42.9 48.0*  MCV 91.3 89.7 89.9 90.5  --  85.6 85.4  PLT 42* 47* 43* 28*  --  41* 55*     Coagulation Studies: No results for input(s): LABPROT, INR in the last 72 hours.  Imaging MRI brain without contrast 06/26/2021: 1. Motion degraded exam. 2. 2 cm focus of FLAIR and T1 hyperintensity involving the anterior/inferior right frontal lobe, new as compared to recent MRI from 05/09/2021. In comparison with prior CT from  06/26/2021, there appears to be a small extra-axial hemorrhage and probable parenchymal contusion at this location. This is likely subacute in nature without significant regional mass effect or edema. Correlation with history for possible recent trauma suggested. Appearance would be atypical and unusual for intracranial spread of infection from adjacent paranasal sinus disease, which would be the primary differential consideration. Short interval follow-up MRI in 2-3 months to ensure these changes resolve is suggested. If there is concern for possible intracranial infection, then correlation with dedicated LP and CSF studies could be performed as warranted. 3. No other acute intracranial abnormality. 4. 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 5. Extensive pan sinusitis with large bilateral mastoid effusions.  ASSESSMENT AND PLAN: 70 year old chronically ill female with end-stage renal disease on hemodialysis, CLL who presented on 06/26/2021 with low-grade fever and confusion and was found to have pneumonia for which she received antibiotics.  Patient also had seizure-like episode on 06/28/2021 following HD and was started on renally dosed Keppra.  New onset epilepsy CLL ESRD on HD Severe protein calorie malnutrition Brain contusion due to fall Prior CVA -LTM EEG showed periodic discharges at 0.5 to 1 Hz predominantly when  awake and stimulated likely due to toxic-metabolic causes.  Recommendations -Continue current dose of Keppra.  Patient will need to be discharged on this even if she is transitioned to hospice -Primary team has been discussing transition to hospice with patient and her husband.  I agree that given patient's significant comorbidities, deteriorating health, she would be a good candidate for hospice.  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  Neurology will sign off.  Please call us for any further questions.  I have spent a total of 25  minutes with the patient reviewing hospital notes,  test results, labs and examining the patient as well as establishing an assessment and plan that was discussed personally with the patient's husband.  > 50% of time was spent in direct patient care.  Zeb Com[facility] Epilepsy [facility] For questions after 5pm please refer to AMION to reach the Neurologist on call

## 2021-07-03 NOTE — Progress Notes (Signed)
Daily Progress Note   Patient Name: Christine Gibson       Date: 07/03/2021 DOB: 1951-02-19  Age: 70 y.o. MRN#: [id number] Attending Physician: Thurnell Lose, MD Primary Care Physician: Michela Pitcher, NP Admit Date: 06/26/2021  Reason for Consultation/Follow-up: Establishing goals of care  Subjective: Medical records reviewed. Patient assessed at the bedside. She remains confused and lethargic, awakens to her name but unable to answer any questions.  Created space and opportunity for thoughts and feelings on patient's current illness. [name] [name] shares his grief and that he was uncertain whether she would survive through the night. We discussed interval history and patient's lack of improvement of the weekend. He would like to support patient to remain as comfortable as possible and would not be surprised if she died in the coming days. However, he is emotionally conflicted and finds it difficult to discontinue life-prolonging interventions. Emotional support and therapeutic listening was provided. [name] notes that she has had a very difficult 3 years and deserves relief from suffering. He also feeling hopeful that her mental status will recover, as this has occurred gradually over 1-2 weeks in the past.  Counseled on the distinction between a full comfort path vs continuing aggressive interventions such as hemodialysis, insulin, labs, vital signs. Patient would no longer receive aggressive medical interventions such as continuous vital signs, lab work, radiology testing, or medications not focused on comfort. All care would focus on how the patient is looking and feeling. This would include management of any symptoms that may cause discomfort, pain, shortness of breath, cough, nausea,  agitation, anxiety, and/or secretions etc. Symptoms would be managed with medications and other non-pharmacological interventions such as spiritual support if requested, repositioning, music therapy, or therapeutic listening.   [name] realizes that continuing these interventions come with a high risk of prolonging her suffering. I shared my professional recommendation for a transition to full comfort care. Discussed rationale for use of dilaudid rather than morphine for pain/dyspnea in the context of ESRD. Updated on head CT results and unfortunately poor prognosis. Reviewed [employer] as an option for end of life care with hospice support.   Questions and concerns addressed. PMT will continue to support holistically.  Length of Stay: 7  Current Medications: Scheduled Meds:   sodium chloride   Intravenous Once  amLODipine  10 mg Oral Daily   carvedilol  12.5 mg Oral BID WC   Chlorhexidine Gluconate Cloth  6 each Topical Q0600   doxercalciferol  2 mcg Intravenous Q T,Th,Sa-HD   hydrALAZINE  50 mg Oral Q8H   insulin aspart  0-6 Units Subcutaneous TID WC    Continuous Infusions:  levETIRAcetam Stopped (06/29/21 1838)   levETIRAcetam 500 mg (07/03/21 0909)    PRN Meds: acetaminophen **OR** acetaminophen, diphenhydrAMINE, hydrALAZINE, HYDROmorphone (DILAUDID) injection, LORazepam, ondansetron **OR** ondansetron (ZOFRAN) IV, oxyCODONE            Vital Signs: BP (!) 161/78 (BP Location: Right Wrist)   Pulse 87   Temp 98.9 F (37.2 C) (Oral)   Resp (!) 115   Wt 38.5 kg   LMP  (LMP Unknown)   SpO2 99%   BMI 17.14 kg/m  SpO2: SpO2: 99 % O2 Device: O2 Device: Room Air O2 Flow Rate: O2 Flow Rate (L/min): 1 L/min  Intake/output summary:  Intake/Output Summary (Last 24 hours) at 07/03/2021 1117 Last data filed at 07/03/2021 4854 Gross per 24 hour  Intake 0 ml  Output 200 ml  Net -200 ml    LBM: Last BM Date: 07/02/21 Baseline Weight: Weight: 44.5 kg Most recent weight:  Weight: 38.5 kg       Palliative Assessment/Data: 20%      Patient Active Problem List   Diagnosis Date Noted   Goals of care, counseling/discussion    Palliative care by specialist    ICH (intracerebral hemorrhage) ([place]) 06/27/2021   Acute encephalopathy 06/26/2021   Community acquired pneumonia of right middle lobe of lung 06/26/2021   Sore in nostril 05/31/2021   COVID-19 05/13/2021   ESRD needing dialysis ([place]) 05/13/2021   Acute CVA (cerebrovascular accident) ([place]) 05/09/2021   Thrombocytopenia ([place])    Iron deficiency anemia 01/06/2021   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/2021   CKD stage 5 secondary to hypertension ([place]) 07/29/2016   HLD (hyperlipidemia) 07/19/2015   HTN (hypertension) 07/19/2015   DM (diabetes mellitus), type 2 ([place]) 07/19/2015   CLL (chronic lymphocytic leukemia) ([place])     Palliative Care Assessment & Plan   Patient Profile: 70 y.o. 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   MRI brain showed acute/subacute right frontal hemorrhage/contusion. CXR consistent with aspiration PNA. Patient has also had recurrent COVID-19 infection for several months, with no immune response to infection or vaccinations likely due to CLL. Palliative care has been consulted to assist with goals of care conversation.  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 ESRD on HD Goals of care conversation  Recommendations/Plan: [name] will tour [employer] and continue deliberating, he is not yet ready to transition to full comfort care Continue current interventions including HD in the meantime, ongoing goc discussions Psychosocial and emotional support provided  Goals of Care and Additional Recommendations: Limitations on Scope of  Treatment: Full Scope Treatment  Prognosis:  Poor prognosis given multiple comorbidities and nutritional/functional/cognitive decline  Discharge Planning: To Be Determined  Care plan was discussed with patient's husband, Dr. Royce Macadamia, Dr. Candiss Norse  Time in: 9:50am Time out: 11:10am Total time: 80 minutes Prolonged time: Yes Greater than 50% of this time was spent in counseling and coordinating care related to the above assessment and plan.  Dorthy Cooler, PA-C Palliative Medicine Team  Team phone # [phone]  Thank you for allowing the Palliative Medicine Team to assist in the care of this patient. Please utilize secure chat with additional questions, if there is no response within 30 minutes please call the above phone number.  Palliative Medicine Team providers are available by phone from 7am to 7pm daily and can be reached through the team cell phone.  Should this patient require assistance outside of these hours, please call the patient's attending physician.

## 2021-07-03 NOTE — Progress Notes (Signed)
PROGRESS NOTE        PATIENT DETAILS Name: Christine Gibson Age: 70 y.o. Sex: female Date of Birth: 03-Jan-1951 Admit Date: 06/26/2021 Admitting Physician Etta Quill, DO EXN:TZGYF, Alyson Locket, NP  Brief Narrative: Patient is a 70 y.o. female ESRD on HD TTS, CLL, DM-2, HTN, HLD, recent CVA in July-presented to the hospital with confusion and low-grade fever.  Significant events: 9/5>> admit-for evaluation of confusion-MRI brain with acute/subacute right frontal hemorrhage/contusion. 9/7>>  witnessed seizure by nursing staff  Significant studies: 9/5>> CXR: Right midlung infiltrate. 9/6>> MRI brain: Anterior/inferior right frontal lobe-likely contusion. 9/6>> EEG: No seizures-moderate diffuse encephalopathy 9/6>> Serum SARS-COV-2 antibody IgG: Not detected. 9/8>> EEG: Generalized periodic discharges-ictal-interictal continuum with high potential for seizures  Antimicrobial therapy: Vancomycin: 9/5 x 1 Cefepime: 9/5>> 9/7 Zithromax: 9/5>> 9/7 Unasyn: 9/8>>  Microbiology data: 5/25>> COVID screening test positive at outside facility (per prior records) 7/19>> COVID PCR: Positive (CT value 24.4) 9/05>> COVID PCR: Positive (CT value 20.4) 9/05>> blood culture: 1/2 staff epidermidis (likely a contaminant)  9/07>> blood culture: No growth   Procedures : None  Consults: Renal Neurology Neurosurgery Palliative care  DVT Prophylaxis : SCDs Start: 06/26/21 2226   Subjective:  Patient in no distress but remains confused and has expressive, denies any headache chest or abdominal pain.  Assessment/Plan:  Acute metabolic encephalopathy: Mental status continues to wax and-but mostly confused-perseverating at times.  Multifactorial etiology-from recent CVA/contusion/seizures/COVID-19 infection/aspiration pneumonia.  Her prognosis is very poor-palliative care following-and engaging with family.  Unfortunately husband seems to have very poor understanding  of her prognosis.  She is DNR, had a detailed DW husband on 07/02/21 2pm, now understands that prognosis is poor, confirmed DNR goal keep patient comfortable, will try and >> Hospice in a few days.  Acute/subacute right frontal lobe contusion: Recommendations from neurosurgery are to continue supportive care.  From my conversation with patient's spouse apparently patient did fall and hit her head sometime in early August.    New onset seizures: Likely due to recent CVA/frontal lobe contusion seen on MRI.  Started on Keppra after discussion with neurology on 9/8.  Repeat EEG on 9/8 with abnormal waves that places the patient at high risk for seizures-neurology planning LTM EEG.  Chronic lymphocytic leukemia with anemia/thrombocytopenia: She follows with oncology at [facility] last oncology note on 04/07/2021-plans were to stop zanubrutinib and initiate hospice care.   Aspiration pneumonia: Continue empiric antimicrobial therapy.  Her CT/chest x-ray imaging is more consistent with aspiration pneumonia rather than COVID-19 pneumonia.  Finished Unasyn course..   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  Review of records from [facility] that she received a dose of Evusheld in January 2022.  Unclear whether she was still taking zanubrutinib prior to this hospitalization as well.  She received a monoclonal antibody infusion on 9/6.  ESRD: On HD TTS-nephrology following for HD needs.  Anemia: Likely multifactorial due to CLL and ESRD.  Transfused 1 unit of PRBC on 9/7.  Follow CBC.    Thrombocytopenia: Likely due to CLL-platelet count fluctuating-  Not sure if any further platelet transfusion is going to be  beneficial given that she appears  to have advanced CLL (transfused 1 unit of pheresis platelets on 9/6).  Follow CBC periodically.  CVA: Hold antiplatelets due to hemorrhage/contusion seen on MRI brain.  HTN: BP high on Coreg +  amlodipine-add Hydralazine.  Hard of hearing  Palliative care: Long discussion with patient's spouse over the phone on 9/9-he had a long meeting with palliative care and Dr. Jonnie Finner on 9/8.  He seems to be very understanding that patient will likely continue to deteriorate-and acknowledges that she may be suffering.  After extensive discussion-he was agreeable to a DNR order.  I have asked him to consider stopping HD and perhaps transition to full comfort measures/residential hospice.  DM-2 (A1c 6.0): Diet controlled at home-start SSI and follow  CBG (last 3)  Recent Labs    07/02/21 1723 07/02/21 2133 07/03/21 0829  GLUCAP 87 84 78    Diet: Diet Order             DIET DYS 3 Room service appropriate? Yes with Assist; Fluid consistency: Thin  Diet effective now                    Code Status: DNR  Family Communication: Spoke with Spouse [name]-[phone]-over phone on 07/02/21 - in detail, understands that she is now dying.  Disposition Plan: Status is: Inpatient  Remains inpatient appropriate because:Inpatient level of care appropriate due to severity of illness  Dispo: The patient is from: Home              Anticipated d/c is to: Home              Patient currently is not medically stable to d/c.   Difficult to place patient No    Barriers to Discharge: Encephalopathic-recurrent COVID-19 infection-not yet stable for discharge-see above documentation.  Antimicrobial agents: Anti-infectives (From admission, onward)    Start     Dose/Rate Route Frequency Ordered Stop   06/29/21 2200  Ampicillin-Sulbactam (UNASYN) 3 g in sodium chloride 0.9 % 100 mL IVPB  Status:  Discontinued        3 g 200 mL/hr over 30 Minutes Intravenous Every 24 hours 06/29/21 1424  07/03/21 1026   06/27/21 2100  ceFEPIme (MAXIPIME) 1 g in sodium chloride 0.9 % 100 mL IVPB  Status:  Discontinued        1 g 200 mL/hr over 30 Minutes Intravenous Every 24 hours 06/26/21 1949 06/29/21 0749   06/26/21 2230  azithromycin (ZITHROMAX) 500 mg in sodium chloride 0.9 % 250 mL IVPB  Status:  Discontinued        500 mg 250 mL/hr over 60 Minutes Intravenous Every 24 hours 06/26/21 2221 06/29/21 0749   06/26/21 1930  vancomycin (VANCOCIN) IVPB 1000 mg/200 mL premix        1,000 mg 200 mL/hr over 60 Minutes Intravenous  Once 06/26/21 1846 06/26/21 2052   06/26/21 1900  ceFEPIme (MAXIPIME) 2 g in sodium chloride 0.9 % 100 mL IVPB        2 g 200 mL/hr over 30 Minutes Intravenous  Once 06/26/21 1846 06/26/21 1937   06/26/21 1845  vancomycin variable dose per unstable renal function (pharmacist dosing)  Status:  Discontinued         Does not apply See admin instructions 06/26/21 1846 06/26/21 2221        Time spent: 35 minutes-Greater than 50% of this time was spent in counseling, explanation of diagnosis, planning of further management, and  coordination of care.  MEDICATIONS: Scheduled Meds:  sodium chloride   Intravenous Once   amLODipine  10 mg Oral Daily   carvedilol  12.5 mg Oral BID WC   Chlorhexidine Gluconate Cloth  6 each Topical Q0600   doxercalciferol  2 mcg Intravenous Q T,Th,Sa-HD   hydrALAZINE  50 mg Oral Q8H   insulin aspart  0-6 Units Subcutaneous TID WC   Continuous Infusions:  levETIRAcetam Stopped (06/29/21 1838)   levETIRAcetam 500 mg (07/03/21 0909)   PRN Meds:.acetaminophen **OR** acetaminophen, diphenhydrAMINE, hydrALAZINE, HYDROmorphone (DILAUDID) injection, LORazepam, ondansetron **OR** ondansetron (ZOFRAN) IV, oxyCODONE   PHYSICAL EXAM: Vital signs: Vitals:   07/02/21 1600 07/02/21 2125 07/03/21 0544 07/03/21 0825  BP: (!) 152/71 (!) 174/74 (!) 171/84 (!) 161/78  Pulse: 88   87  Resp: 18 20  (!) 115  Temp:  98.2 F (36.8 C)  98.9 F (37.2  C)  TempSrc:  Oral  Oral  SpO2: 98% 95%  99%  Weight:       Filed Weights   07/02/21 0455 07/02/21 0850 07/02/21 1212  Weight: 39.9 kg 39.2 kg 38.5 kg   Body mass index is 17.14 kg/m.    Exam  Awake but not alert ++ expressive aphasia, appears weak and cachectic  [place].AT,PERRAL Supple Neck,No JVD, No cervical lymphadenopathy appriciated.  Symmetrical Chest wall movement, Good air movement bilaterally, CTAB RRR,No Gallops, Rubs or new Murmurs, No Parasternal Heave +ve B.Sounds, Abd Soft, No tenderness, No organomegaly appriciated, No rebound - guarding or rigidity. No Cyanosis, Clubbing or edema, No new Rash or bruise     I have personally reviewed following labs and imaging studies  LABORATORY DATA: CBC: Recent Labs  Lab 06/26/21 1800 06/27/21 0816 06/27/21 1317 06/28/21 0108 06/28/21 1215 06/29/21 0307 07/02/21 1000  WBC 4.0 4.3 3.8* 2.3*  --  4.7 16.4*  NEUTROABS 0.9*  --   --   --   --   --   --   HGB 10.6* 10.6* 9.9* 7.0* 12.7 13.7 14.9  HCT 35.5* 34.8* 32.9* 22.9* 40.4 42.9 48.0*  MCV 91.3 89.7 89.9 90.5  --  85.6 85.4  PLT 42* 47* 43* 28*  --  41* 55*    Basic Metabolic Panel: Recent Labs  Lab 06/27/21 0816 06/28/21 0315 06/29/21 0558 07/02/21 1000 07/03/21 0118  NA 143 143 140 142 139  K 4.6 4.7 3.7 3.6 3.8  CL 105 103 101 101 100  CO2 24 24 25 23 22   GLUCOSE 91 83 80 91 77  BUN 40* 44* 17 21 12   CREATININE 7.10* 7.79* 5.00* 6.74* 4.66*  CALCIUM 8.3* 8.3* 7.8* 9.2 8.9  MG  --   --  1.7  --   --   PHOS  --  8.1* 4.6 5.1*  --     GFR: Estimated Creatinine Clearance: 6.7 mL/min (A) (by C-G formula based on SCr of 4.66 mg/dL (H)).  Liver Function Tests: Recent Labs  Lab 06/26/21 1800 06/27/21 0816 06/28/21 0315 06/29/21 0558 07/02/21 1000  AST 26 21  --   --   --   ALT 12 12  --   --   --   ALKPHOS 53 50  --   --   --   BILITOT 0.5 0.7  --   --   --   PROT 4.6* 4.8*  --   --   --   ALBUMIN 2.4* 2.4* 2.3* 2.3* 2.4*   No results for  input(s): LIPASE, AMYLASE in the  last 168 hours. Recent Labs  Lab 06/26/21 2204  AMMONIA 34    Coagulation Profile: Recent Labs  Lab 06/26/21 1800  INR 1.2    Cardiac Enzymes: No results for input(s): CKTOTAL, CKMB, CKMBINDEX, TROPONINI in the last 168 hours.  BNP (last 3 results) No results for input(s): PROBNP in the last 8760 hours.  Lipid Profile: No results for input(s): CHOL, HDL, LDLCALC, TRIG, CHOLHDL, LDLDIRECT in the last 72 hours.  Thyroid Function Tests: No results for input(s): TSH, T4TOTAL, FREET4, T3FREE, THYROIDAB in the last 72 hours.   Anemia Panel: No results for input(s): VITAMINB12, FOLATE, FERRITIN, TIBC, IRON, RETICCTPCT in the last 72 hours.   Urine analysis:    Component Value Date/Time   COLORURINE STRAW (A) 01/12/2019 1130   APPEARANCEUR CLEAR 01/12/2019 1130   LABSPEC 1.007 01/12/2019 1130   PHURINE 7.0 01/12/2019 1130   GLUCOSEU 150 (A) 01/12/2019 1130   HGBUR NEGATIVE 01/12/2019 1130   BILIRUBINUR NEGATIVE 01/12/2019 1130   KETONESUR NEGATIVE 01/12/2019 1130   PROTEINUR >=300 (A) 01/12/2019 1130   NITRITE NEGATIVE 01/12/2019 1130   LEUKOCYTESUR NEGATIVE 01/12/2019 1130    Sepsis Labs: Lactic Acid, Venous    Component Value Date/Time   LATICACIDVEN 1.1 06/26/2021 2204     RADIOLOGY STUDIES/RESULTS: No results found.   LOS: 7 days   Signature  Lala Lund M.D on 07/03/2021 at 10:26 AM   -  To page go to [email or web address]

## 2021-07-03 NOTE — Progress Notes (Signed)
Notified md of patient status: good morning, Christine Gibson has been pocketing food and not eating, even with encouragement, are we able to change her bp meds to iv? her systolic this am was not above 180, did not fit the parameter of prn apresoline. thank you  Received md reply: patient is comfort care, ok if she isn't able to receive all medications at this time.

## 2021-07-04 DIAGNOSIS — C9110 Chronic lymphocytic leukemia of B-cell type not having achieved remission: Secondary | ICD-10-CM | POA: Diagnosis not present

## 2021-07-04 DIAGNOSIS — G9340 Encephalopathy, unspecified: Secondary | ICD-10-CM | POA: Diagnosis not present

## 2021-07-04 DIAGNOSIS — U071 COVID-19: Secondary | ICD-10-CM | POA: Diagnosis not present

## 2021-07-04 DIAGNOSIS — I639 Cerebral infarction, unspecified: Secondary | ICD-10-CM | POA: Diagnosis not present

## 2021-07-04 LAB — GLUCOSE, CAPILLARY
Glucose-Capillary: 158 mg/dL — ABNORMAL HIGH (ref 70–99)
Glucose-Capillary: 185 mg/dL — ABNORMAL HIGH (ref 70–99)

## 2021-07-04 LAB — TYPE AND SCREEN
ABO/RH(D): B POS
Antibody Screen: NEGATIVE

## 2021-07-04 NOTE — Progress Notes (Signed)
PROGRESS NOTE        PATIENT DETAILS Name: Christine Gibson Age: 70 y.o. Sex: female Date of Birth: 11/07/1950 Admit Date: 06/26/2021 Admitting Physician Etta Quill, DO ZGY:FVCBS, Christine Locket, NP  Brief Narrative: Patient is a 70 y.o. female ESRD on HD TTS, CLL, DM-2, HTN, HLD, recent CVA in July-presented to the hospital with confusion and low-grade fever.  Significant events: 9/5>> admit-for evaluation of confusion-MRI brain with acute/subacute right frontal hemorrhage/contusion. 9/7>>  witnessed seizure by nursing staff  Significant studies: 9/5>> CXR: Right midlung infiltrate. 9/6>> MRI brain: Anterior/inferior right frontal lobe-likely contusion. 9/6>> EEG: No seizures-moderate diffuse encephalopathy 9/6>> Serum SARS-COV-2 antibody IgG: Not detected. 9/8>> EEG: Generalized periodic discharges-ictal-interictal continuum with high potential for seizures  Antimicrobial therapy: Vancomycin: 9/5 x 1 Cefepime: 9/5>> 9/7 Zithromax: 9/5>> 9/7 Unasyn: 9/8>>  Microbiology data: 5/25>> COVID screening test positive at outside facility (per prior records) 7/19>> COVID PCR: Positive (CT value 24.4) 9/05>> COVID PCR: Positive (CT value 20.4) 9/05>> blood culture: 1/2 staff epidermidis (likely a contaminant)  9/07>> blood culture: No growth   Procedures : None  Consults: Renal Neurology Neurosurgery Palliative care  DVT Prophylaxis : SCDs Start: 06/26/21 2226   Subjective:  Patient in no distress but remains confused and has expressive, denies any headache chest or abdominal pain.  Assessment/Plan:  Acute metabolic encephalopathy: Mental status continues to wax and-but mostly confused-perseverating at times.  Multifactorial etiology-from recent CVA/contusion/seizures/COVID-19 infection/aspiration pneumonia.  Her prognosis is very poor-palliative care following-and engaging with family.  Unfortunately husband seems to have very poor understanding  of her prognosis.  She is DNR, had a detailed DW husband on 07/02/21 2pm, now understands that prognosis is poor, confirmed DNR goal keep patient comfortable, will try and >> Hospice in a few days.  Discussed with him again on 07/05/2019, continue HD but getting close to deciding about residential hospice and cessation of future HD treatments.  Acute/subacute right frontal lobe contusion: Recommendations from neurosurgery are to continue supportive care.  From my conversation with patient's spouse apparently patient did fall and hit her head sometime in early August.    New onset seizures: Likely due to recent CVA/frontal lobe contusion seen on MRI.  Started on Keppra after discussion with neurology on 9/8.  Repeat EEG on 9/8 with abnormal waves that places the patient at high risk for seizures-neurology planning LTM EEG.  Chronic lymphocytic leukemia with anemia/thrombocytopenia: She follows with oncology at [facility] last oncology note on 04/07/2021-plans were to stop zanubrutinib and initiate hospice care.   Aspiration pneumonia: Continue empiric antimicrobial therapy.  Her CT/chest x-ray imaging is more consistent with aspiration pneumonia rather than COVID-19 pneumonia.  Finished Unasyn course..   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  Review of records from [facility] that she received a dose of Evusheld in January 2022.  Unclear whether she was still taking zanubrutinib prior to this hospitalization as well.  She received a monoclonal antibody infusion on 9/6.  ESRD: On HD TTS-nephrology following for HD needs.  Anemia: Likely multifactorial due to CLL and ESRD.  Transfused 1 unit of PRBC on 9/7.  Follow  CBC.     Thrombocytopenia: Likely due to CLL-platelet count fluctuating-  Not sure if any further platelet transfusion is going to be beneficial given that she appears to have advanced CLL (transfused 1 unit of pheresis platelets on 9/6).  Follow CBC periodically.  CVA: Hold antiplatelets due to hemorrhage/contusion seen on MRI brain.  HTN: BP high on Coreg +  amlodipine-add Hydralazine.  Hard of hearing  Palliative care: Long discussion with patient's spouse over the phone on 9/9-he had a long meeting with palliative care and Dr. Jonnie Finner on 9/8.  He seems to be very understanding that patient will likely continue to deteriorate-and acknowledges that she may be suffering.  After extensive discussion-he was agreeable to a DNR order.  I have asked him to consider stopping HD and perhaps transition to full comfort measures/residential hospice.  DM-2 (A1c 6.0): Diet controlled at home-start SSI and follow  CBG (last 3)  Recent Labs    07/03/21 0829 07/03/21 1949 07/04/21 0939  GLUCAP 78 77 158*    Diet: Diet Order             DIET DYS 3 Room service appropriate? Yes with Assist; Fluid consistency: Thin  Diet effective now                    Code Status: DNR  Family Communication: Spoke with Spouse [name]-[phone]-over phone on 07/02/21 - in detail, understands that she is now dying.  Disposition Plan: Status is: Inpatient  Remains inpatient appropriate because:Inpatient level of care appropriate due to severity of illness  Dispo: The patient is from: Home              Anticipated d/c is to: Home              Patient currently is not medically stable to d/c.   Difficult to place patient No    Barriers to Discharge: Encephalopathic-recurrent COVID-19 infection-not yet stable for discharge-see above documentation.  Antimicrobial agents: Anti-infectives (From admission, onward)    Start     Dose/Rate Route Frequency Ordered Stop   06/29/21 2200   Ampicillin-Sulbactam (UNASYN) 3 g in sodium chloride 0.9 % 100 mL IVPB  Status:  Discontinued        3 g 200 mL/hr over 30 Minutes Intravenous Every 24 hours 06/29/21 1424 07/03/21 1026   06/27/21 2100  ceFEPIme (MAXIPIME) 1 g in sodium chloride 0.9 % 100 mL IVPB  Status:  Discontinued        1 g 200 mL/hr over 30 Minutes Intravenous Every 24 hours 06/26/21 1949 06/29/21 0749   06/26/21 2230  azithromycin (ZITHROMAX) 500 mg in sodium chloride 0.9 % 250 mL IVPB  Status:  Discontinued        500 mg 250 mL/hr over 60 Minutes Intravenous Every 24 hours 06/26/21 2221 06/29/21 0749   06/26/21 1930  vancomycin (VANCOCIN) IVPB 1000 mg/200 mL premix        1,000 mg 200 mL/hr over 60 Minutes Intravenous  Once 06/26/21 1846 06/26/21 2052   06/26/21 1900  ceFEPIme (MAXIPIME) 2 g in sodium chloride 0.9 % 100 mL IVPB        2 g 200 mL/hr over 30 Minutes Intravenous  Once 06/26/21 1846 06/26/21 1937   06/26/21 1845  vancomycin variable dose per unstable renal function (pharmacist dosing)  Status:  Discontinued         Does not apply See admin instructions 06/26/21 1846 06/26/21 2221  Time spent: 35 minutes-Greater than 50% of this time was spent in counseling, explanation of diagnosis, planning of further management, and coordination of care.  MEDICATIONS: Scheduled Meds:  amLODipine  10 mg Oral Daily   carvedilol  12.5 mg Oral BID WC   Chlorhexidine Gluconate Cloth  6 each Topical Q0600   Chlorhexidine Gluconate Cloth  6 each Topical Q0600   doxercalciferol  2 mcg Intravenous Q T,Th,Sa-HD   hydrALAZINE  50 mg Oral Q8H   insulin aspart  0-6 Units Subcutaneous TID WC   Continuous Infusions:  levETIRAcetam Stopped (06/29/21 1838)   levETIRAcetam Stopped (07/03/21 0924)   PRN Meds:.acetaminophen **OR** acetaminophen, diphenhydrAMINE, hydrALAZINE, HYDROmorphone (DILAUDID) injection, LORazepam, ondansetron **OR** ondansetron (ZOFRAN) IV, oxyCODONE   PHYSICAL EXAM: Vital signs: Vitals:    07/03/21 2000 07/03/21 2006 07/04/21 0337 07/04/21 0935  BP: (!) 175/73 (!) 175/73  (!) 185/80  Pulse: 77   85  Resp:      Temp:    98.7 F (37.1 C)  TempSrc:    Axillary  SpO2: 97%   97%  Weight:   39.6 kg    Filed Weights   07/02/21 0850 07/02/21 1212 07/04/21 0337  Weight: 39.2 kg 38.5 kg 39.6 kg   Body mass index is 17.63 kg/m.    Exam  Awake but not alert ++ expressive aphasia, appears weak and cachectic  [place].AT,PERRAL Supple Neck,No JVD, No cervical lymphadenopathy appriciated.  Symmetrical Chest wall movement, Good air movement bilaterally, CTAB RRR,No Gallops, Rubs or new Murmurs, No Parasternal Heave +ve B.Sounds, Abd Soft, No tenderness, No organomegaly appriciated, No rebound - guarding or rigidity. No Cyanosis, Clubbing or edema, No new Rash or bruise     I have personally reviewed following labs and imaging studies  LABORATORY DATA: CBC: Recent Labs  Lab 06/27/21 1317 06/28/21 0108 06/28/21 1215 06/29/21 0307 07/02/21 1000  WBC 3.8* 2.3*  --  4.7 16.4*  HGB 9.9* 7.0* 12.7 13.7 14.9  HCT 32.9* 22.9* 40.4 42.9 48.0*  MCV 89.9 90.5  --  85.6 85.4  PLT 43* 28*  --  41* 55*    Basic Metabolic Panel: Recent Labs  Lab 06/28/21 0315 06/29/21 0558 07/02/21 1000 07/03/21 0118  NA 143 140 142 139  K 4.7 3.7 3.6 3.8  CL 103 101 101 100  CO2 24 25 23 22   GLUCOSE 83 80 91 77  BUN 44* 17 21 12   CREATININE 7.79* 5.00* 6.74* 4.66*  CALCIUM 8.3* 7.8* 9.2 8.9  MG  --  1.7  --   --   PHOS 8.1* 4.6 5.1*  --     GFR: Estimated Creatinine Clearance: 6.9 mL/min (A) (by C-G formula based on SCr of 4.66 mg/dL (H)).  Liver Function Tests: Recent Labs  Lab 06/28/21 0315 06/29/21 0558 07/02/21 1000  ALBUMIN 2.3* 2.3* 2.4*   No results for input(s): LIPASE, AMYLASE in the last 168 hours. No results for input(s): AMMONIA in the last 168 hours.   Coagulation Profile: No results for input(s): INR, PROTIME in the last 168 hours.   Cardiac  Enzymes: No results for input(s): CKTOTAL, CKMB, CKMBINDEX, TROPONINI in the last 168 hours.  BNP (last 3 results) No results for input(s): PROBNP in the last 8760 hours.  Lipid Profile: No results for input(s): CHOL, HDL, LDLCALC, TRIG, CHOLHDL, LDLDIRECT in the last 72 hours.  Thyroid Function Tests: No results for input(s): TSH, T4TOTAL, FREET4, T3FREE, THYROIDAB in the last 72 hours.   Anemia Panel: No results for input(s):  VITAMINB12, FOLATE, FERRITIN, TIBC, IRON, RETICCTPCT in the last 72 hours.   Urine analysis:    Component Value Date/Time   COLORURINE STRAW (A) 01/12/2019 1130   APPEARANCEUR CLEAR 01/12/2019 1130   LABSPEC 1.007 01/12/2019 1130   PHURINE 7.0 01/12/2019 1130   GLUCOSEU 150 (A) 01/12/2019 1130   HGBUR NEGATIVE 01/12/2019 1130   BILIRUBINUR NEGATIVE 01/12/2019 1130   KETONESUR NEGATIVE 01/12/2019 1130   PROTEINUR >=300 (A) 01/12/2019 1130   NITRITE NEGATIVE 01/12/2019 1130   LEUKOCYTESUR NEGATIVE 01/12/2019 1130    Sepsis Labs: Lactic Acid, Venous    Component Value Date/Time   LATICACIDVEN 1.1 06/26/2021 2204     RADIOLOGY STUDIES/RESULTS: No results found.   LOS: 8 days   Signature  Lala Lund M.D on 07/04/2021 at 11:13 AM   -  To page go to [email or web address]

## 2021-07-04 NOTE — Progress Notes (Signed)
Daily Progress Note   Patient Name: Christine Gibson       Date: 07/04/2021 DOB: [birth year]  Age: 70 y.o. MRN#: [id number] Attending Physician: Thurnell Lose, MD Primary Care Physician: Michela Pitcher, NP Admit Date: 06/26/2021  Reason for Consultation/Follow-up: Establishing goals of care  Subjective: Medical records reviewed. Called patient's husband to continue goals of care conversation. He tells me he is sad today. He tried to arrive at the hospital this morning in time to avoid patient going through HD, but he was too late. He is worried that his decisions are leading to her suffering and shares that he has now decided that today will be her last HD treatment. Discussed his thoughts on [facility] after his tour and he is still concerned/uncertain at this time. He is also not ready to discontinue other medical interventions, such as insulin, labs, vital signs. Counseled on the lack of benefit from these interventions at end of life. He prefers to discuss further after patient returns from HD.  Returned to the bedside at husband's request. He shares that Christine Gibson does not appear to be suffering as much as he expected after HD. He also asked her if she would like to stop HD and reports that she clearly stated "no." He tells me that he asked if she would like to continue HD and she responded "yes."   Patient assessed at the bedside. She remains disoriented x3, endorses pain. She is unable to recall that had HD session today and her responses vary greatly. Counseled patient's husband that she does not have capacity and unfortunately it is still up to him to process medical updates and make an informed decision on her behalf. Discussed his concern that he is being pressured to allow patient's death.  Reassured that the priority is honoring patient's wishes and providing relief from unnecessary suffering. [name] does not want to proceed with HD if she declines further but is now uncertain if he will change his mind. He is reflecting heavily on the impact this decision will have on his faith and God's judgement of his patience. Emotional support and therapeutic listening was provided.   Questions and concerns addressed. PMT will continue to support holistically.  Length of Stay: 8  Current Medications: Scheduled Meds:   amLODipine  10 mg Oral Daily  carvedilol  12.5 mg Oral BID WC   Chlorhexidine Gluconate Cloth  6 each Topical Q0600   Chlorhexidine Gluconate Cloth  6 each Topical Q0600   doxercalciferol  2 mcg Intravenous Q T,Th,Sa-HD   hydrALAZINE  50 mg Oral Q8H   insulin aspart  0-6 Units Subcutaneous TID WC    Continuous Infusions:  levETIRAcetam Stopped (06/29/21 1838)   levETIRAcetam Stopped (07/03/21 0924)    PRN Meds: acetaminophen **OR** acetaminophen, diphenhydrAMINE, hydrALAZINE, HYDROmorphone (DILAUDID) injection, LORazepam, ondansetron **OR** ondansetron (ZOFRAN) IV, oxyCODONE            Vital Signs: BP 140/69   Pulse 72   Temp 97.6 F (36.4 C) (Axillary)   Resp 20   Wt 39.6 kg   LMP  (LMP Unknown)   SpO2 97%   BMI 17.63 kg/m  SpO2: SpO2: 97 % O2 Device: O2 Device: Room Air O2 Flow Rate: O2 Flow Rate (L/min): 1 L/min  Intake/output summary:  Intake/Output Summary (Last 24 hours) at 07/04/2021 1332 Last data filed at 07/03/2021 2200 Gross per 24 hour  Intake 237 ml  Output --  Net 237 ml    LBM: Last BM Date: 07/02/21 Baseline Weight: Weight: 44.5 kg Most recent weight: Weight: 39.6 kg       Palliative Assessment/Data: 20%      Patient Active Problem List   Diagnosis Date Noted   Goals of care, counseling/discussion    Palliative care by specialist    ICH (intracerebral hemorrhage) ([place]) 06/27/2021   Acute encephalopathy 06/26/2021    Community acquired pneumonia of right middle lobe of lung 06/26/2021   Sore in nostril 05/31/2021   COVID-19 05/13/2021   ESRD needing dialysis ([place]) 05/13/2021   Acute CVA (cerebrovascular accident) ([place]) 05/09/2021   Thrombocytopenia ([place])    Iron deficiency anemia 01/06/2021   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/2021   CKD stage 5 secondary to hypertension ([place]) 07/29/2016   HLD (hyperlipidemia) 07/19/2015   HTN (hypertension) 07/19/2015   DM (diabetes mellitus), type 2 ([place]) 07/19/2015   CLL (chronic lymphocytic leukemia) ([place])     Palliative Care Assessment & Plan   Patient Profile: 70 y.o. 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   MRI brain showed acute/subacute right frontal hemorrhage/contusion. CXR consistent with aspiration PNA. Patient has also had recurrent COVID-19 infection for several months, with no immune response to infection or vaccinations likely due to CLL. Palliative care has been consulted to assist with goals of care conversation.  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 ESRD on HD Goals of care conversation  Recommendations/Plan: DNR [name] is leaning towards discontinuing HD after today's session, however he remains uncertain and hopes to gain clarity over the next couple of days Spiritual care consult appreciated  Psychosocial and emotional support provided PMT will continue supporting daily   Goals of Care and Additional Recommendations: Limitations on Scope of Treatment: Full Scope Treatment  Prognosis:  Poor prognosis given multiple comorbidities and nutritional/functional/cognitive decline  Discharge Planning: To Be Determined  Care plan was discussed with patient's husband, Dr. Royce Macadamia, Dr.  Candiss Norse  Total time: 60 minutes Greater than 50% of this time was spent in counseling and coordinating care related to the above assessment and plan.  Dorthy Cooler, PA-C Palliative Medicine Team Team phone # [phone]  Thank you for allowing  the Palliative Medicine Team to assist in the care of this patient. Please utilize secure chat with additional questions, if there is no response within 30 minutes please call the above phone number.  Palliative Medicine Team providers are available by phone from 7am to 7pm daily and can be reached through the team cell phone.  Should this patient require assistance outside of these hours, please call the patient's attending physician.

## 2021-07-04 NOTE — Progress Notes (Signed)
OT Cancellation Note  Patient Details Name: Christine Gibson MRN: [id number] DOB: 13-Jul-1951   Cancelled Treatment:    Reason Eval/Treat Not Completed: Patient at procedure or test/ unavailable: Per RN, pt off floor for HD. Will continue efforts.  Julien Girt 07/04/2021, 11:28 AM

## 2021-07-04 NOTE — Progress Notes (Signed)
SLP Cancellation Note  Patient Details Name: Christine Gibson MRN: [id number] DOB: Jun 10, 1951   Cancelled treatment:       Reason Eval/Treat Not Completed: Patient at procedure or test/unavailable. Pt currently at dialysis. Will continue efforts    Hayden Rasmussen MA, CCC-SLP Acute Rehabilitation Services   07/04/2021, 11:06 AM

## 2021-07-04 NOTE — Progress Notes (Signed)
Pt transported to dialysis

## 2021-07-04 NOTE — Progress Notes (Signed)
[facility] Progress Note  Subjective:  Seen and examined on dialysis.  Blood pressure 151/75 on HD.  LUE AVG in use.  Spoke with HD RN and earlier she was given a bolus for hypotension.  She has been on room air   Vitals:   07/04/21 1100 07/04/21 1115 07/04/21 1120 07/04/21 1130  BP: (!) 158/80 (!) 85/55 130/85 130/69  Pulse: 87 86 87 85  Resp: 16 16 18 18   Temp:      TempSrc:      SpO2:      Weight:        Exam:  General adult female in bed, chronically ill appearing no acute distress HEENT normocephalic atraumatic  Lungs normal work of breathing at rest on room air Heart S1S2  Extremities no edema  Psych - no agitation  Neuro - confused per nursing  Access:   L arm AVG in use      Home meds include norvasc, lipitor, coreg, ativan prn, losartan, roxanol prn, adalat 60 qd, oxy IR prn, protonix, zoloft, Zanubrutinib tiw po, prn's    CXR - IMPRESSION: 1. New right midlung infiltrate. Questionable new right hilar enlargement. Findings may be related to pneumonia with right hilar lymphadenopathy. Short-term follow-up two views chest and/or CT recommended to exclude underlying mass at this level.    OP HD: TTS [place]  3.5h  41.5kg  400/500  3K/2.5 bath    L AVG  Hep NONE (due to low plts)  - hect 2 ug tiw  - mircera 200 q2 last 8/30     Assessment/ Plan: Aspiration PNA - RML, on IV unasyn  AMS - recent CVA, contusion, seizures, COVID-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 Not improving. Very poor prognosis.  Nephrology has charted support transition to comfort care/ hospice  ESRD - on HD TTS schedule usually.  last hd on 9/11 off schedule due to pt volumes.  Continue HD as aligns with goals of care - please notify nephrology if goals do change.  Goals seem in flux at this time Chronic COVID infection / FTT - pt was COVID + first during May 2022 admit, has been testing + repeatedly since then. Negative testing for COVID antibodies per charting FTT - appreciate  palliative efforts, working w/ family Acute/ subactue R frontal lobe contusion: per Nsurg no surg needed CLL/ chronic thrombocytopenia - f/u [facility]. Last seen in June, 2022 by [facility] and plans were to stop zanubrutinib and initiate hospice care.  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 HTN/ volume - under dry wt slightly. No vol excess on exam, BP's up, on home norvasc and note hydralazine added   Disposition - per primary team. Appreciate primary and palliative.  Appreciate their assistance with goals of care; discussions ongoing   Recent Labs  Lab 06/29/21 0307 06/29/21 0558 07/02/21 1000 07/03/21 0118  K  --  3.7 3.6 3.8  BUN  --  17 21 12   CREATININE  --  5.00* 6.74* 4.66*  CALCIUM  --  7.8* 9.2 8.9  PHOS  --  4.6 5.1*  --   HGB 13.7  --  14.9  --    Inpatient medications:  amLODipine  10 mg Oral Daily   carvedilol  12.5 mg Oral BID WC   Chlorhexidine Gluconate Cloth  6 each Topical Q0600   Chlorhexidine Gluconate Cloth  6 each Topical Q0600   doxercalciferol  2 mcg Intravenous Q T,Th,Sa-HD   hydrALAZINE  50 mg Oral Q8H   insulin aspart  0-6 Units Subcutaneous TID WC    levETIRAcetam Stopped (06/29/21 1838)   levETIRAcetam Stopped (07/03/21 0924)   acetaminophen **OR** acetaminophen, diphenhydrAMINE, hydrALAZINE, HYDROmorphone (DILAUDID) injection, LORazepam, ondansetron **OR** ondansetron (ZOFRAN) IV, oxyCODONE    Claudia Desanctis, MD 07/04/2021 12:01 PM

## 2021-07-05 DIAGNOSIS — I639 Cerebral infarction, unspecified: Secondary | ICD-10-CM | POA: Diagnosis not present

## 2021-07-05 DIAGNOSIS — G9340 Encephalopathy, unspecified: Secondary | ICD-10-CM | POA: Diagnosis not present

## 2021-07-05 DIAGNOSIS — U071 COVID-19: Secondary | ICD-10-CM | POA: Diagnosis not present

## 2021-07-05 DIAGNOSIS — R52 Pain, unspecified: Secondary | ICD-10-CM

## 2021-07-05 DIAGNOSIS — I10 Essential (primary) hypertension: Secondary | ICD-10-CM

## 2021-07-05 DIAGNOSIS — Z66 Do not resuscitate: Secondary | ICD-10-CM

## 2021-07-05 LAB — RENAL FUNCTION PANEL
Albumin: 2.6 g/dL — ABNORMAL LOW (ref 3.5–5.0)
Anion gap: 14 (ref 5–15)
BUN: 14 mg/dL (ref 8–23)
CO2: 27 mmol/L (ref 22–32)
Calcium: 9.2 mg/dL (ref 8.9–10.3)
Chloride: 100 mmol/L (ref 98–111)
Creatinine, Ser: 4.41 mg/dL — ABNORMAL HIGH (ref 0.44–1.00)
GFR, Estimated: 10 mL/min — ABNORMAL LOW (ref >60)
Glucose, Bld: 113 mg/dL — ABNORMAL HIGH (ref 70–99)
Phosphorus: 3.4 mg/dL (ref 2.5–4.6)
Potassium: 3.7 mmol/L (ref 3.5–5.1)
Sodium: 141 mmol/L (ref 135–145)

## 2021-07-05 LAB — GLUCOSE, CAPILLARY
Glucose-Capillary: 156 mg/dL — ABNORMAL HIGH (ref 70–99)
Glucose-Capillary: 122 mg/dL — ABNORMAL HIGH (ref 70–99)
Glucose-Capillary: 121 mg/dL — ABNORMAL HIGH (ref 70–99)

## 2021-07-05 MED ORDER — CHLORHEXIDINE GLUCONATE CLOTH 2 % EX PADS: 6.0000 | MEDICATED_PAD | Freq: Every day | CUTANEOUS | Status: DC

## 2021-07-05 NOTE — Progress Notes (Signed)
SLP Cancellation Note  Patient Details Name: Christine Gibson MRN: [id number] DOB: Oct 02, 1951   Cancelled treatment:       Reason Eval/Treat Not Completed: Other (comment) (Per Devin, RN and indicator on pt's door, pt has been transitioned to comfort care. SLP will sign off.)  Amanada Philbrick I. Hardin Negus, [place], [place] Office number [phone] Pager [place] 07/05/2021, 11:42 AM

## 2021-07-05 NOTE — Progress Notes (Signed)
Nutrition Brief Note  Chart reviewed. Pt now transitioning to comfort care.  No further nutrition interventions planned at this time.  Please re-consult as needed.   Venesa Semidey W, RD, LDN, CDCES Registered Dietitian II Certified Diabetes Care and Education Specialist Please refer to [facility] for RD and/or RD on-call/weekend/after hours pager   

## 2021-07-05 NOTE — Progress Notes (Signed)
   Palliative Medicine Inpatient Follow Up Note   Chart Reviewed. Patient examined at the bedside. She is awake and alert. Able to state name and date of birth. She is observed moaning intermittently but denies pain. Husband is at the bedside. Patient is requesting something to eat and drink. Husband observed feeding patient. She ate a few bites and took a few sips before declining. Husband has their [place] on live at the bedside.  Support provided to Mr. Rocha. He spends time sharing patient has been told on multiple occassions that she is at end-of-life and she tends to "bounce back and prove the medical team wrong". I acknowledge his previous experiences encouraging him to focus on her quality of life after each event and how it was affected.   Mr. Langseth was tearful stating his awareness of her decline but would then compare to when she was doing well. He verbalizes understanding that his wife is at end-of-life but that it is hard to accept because of his love for her. He does not wish for her to suffer.   We discussed comfort focused care (including discontinuation of dialysis) and hospice. Mr. Kemnitz shares patient was under hospice support in the home prior to hospitalization. Education provided on [employer] home with hospice support. Husband verbalizes understanding and states he wishes to have additional time to make final decisions. He would like to focus on spending time with Loryn given she is more awake, alert, and interactive with him today.   Discussed the importance of continued conversation with family and their  medical providers regarding overall plan of care and treatment options, ensuring decisions are within the context of the patients values and GOCs.  Questions and concerns addressed   Objective Assessment: Vital Signs Vitals:   07/05/21 0936 07/05/21 1329  BP: (!) 168/71 (!) 175/82  Pulse: 73 86  Resp: 20 18  Temp: 98.8 F (37.1 C) 97.9 F (36.6 C)  SpO2:  94% 95%    Intake/Output Summary (Last 24 hours) at 07/05/2021 1549 Last data filed at 07/04/2021 2000 Gross per 24 hour  Intake 100 ml  Output --  Net 100 ml   Last Weight  Most recent update: 07/05/2021  4:06 AM    Weight  38.6 kg (85 lb 1.6 oz)            Gen:  NAD, cachectic, chronically-ill appearing HEENT: moist mucous membranes CV: RRR PULM: diminished bilaterally ABD: soft/nontender/nondistended/normal bowel sounds EXT: thin Neuro: Alert and oriented, mood appropriate, occasional moaning  SUMMARY OF RECOMMENDATIONS   -Ongoing support to husband as he makes complex decisions. Goal is for comfort focused care.  -DNR/DNI -Emotional support provided -PMT will continue to support and follow    Time Total: 45 min.   Visit consisted of counseling and education dealing with the complex and emotionally intense issues of symptom management and palliative care in the setting of serious and potentially life-threatening illness.Greater than 50%  of this time was spent counseling and coordinating care related to the above assessment and plan.  Alda Lea, AGPCNP-BC  Palliative Medicine Team [phone]  Palliative Medicine Team providers are available by phone from 7am to 7pm daily and can be reached through the team cell phone.  Should this patient require assistance outside of these hours, please call the patient's attending physician.

## 2021-07-05 NOTE — Progress Notes (Signed)
[facility] Progress Note  Subjective:  Last HD on 9/13 with 1 kg UF.  her husband has been having goals of care discussions with her team and palliative and had initially said 9/13 would be her last tx then changed his mind. Lots to think about and this has been hard.  He is not at bedside today.   Review of systems:  Limited given ams  Denies overt shortness of breath   Vitals:   07/04/21 1355 07/04/21 2008 07/05/21 0406 07/05/21 0936  BP: (!) 147/75 (!) 136/59  (!) 168/71  Pulse: 88 83  73  Resp: 20 18  20   Temp: 97.6 F (36.4 C) 97.6 F (36.4 C)  98.8 F (37.1 C)  TempSrc: Axillary Oral  Oral  SpO2: 98% 96%  94%  Weight: 38.7 kg  38.6 kg     Exam: General adult female in bed, chronically ill appearing no acute distress HEENT normocephalic atraumatic  Neck supple trachea midline Lungs clear to auscultation bilaterally normal work of breathing at rest  Heart S1S2 no rub Abdomen soft nontender nondistended Extremities no edema  Psych - no agitation  Neuro - confused; turns head and opens eyes to name. States year is "2020 2022."   Access:   L arm AVG+bruit      per charting Home meds include norvasc, lipitor, coreg, ativan prn, losartan, roxanol prn, adalat 60 qd, oxy IR prn, protonix, zoloft, Zanubrutinib tiw po, prn's    CXR - IMPRESSION: 1. New right midlung infiltrate. Questionable new right hilar enlargement. Findings may be related to pneumonia with right hilar lymphadenopathy. Short-term follow-up two views chest and/or CT recommended to exclude underlying mass at this level.    OP HD: TTS [place]  3.5h  41.5kg  400/500  3K/2.5 bath    L AVG  Hep NONE (due to low plts)  - hect 2 ug tiw  - mircera 200 q2 last 8/30     Assessment/ Plan: Aspiration PNA - RML, on IV unasyn  AMS - recent CVA, contusion, seizures, COVID-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 Not improving and she has a poor prognosis. Nephrology has charted support transition to comfort  care/ hospice  ESRD - on HD TTS schedule.  Continue HD as aligns with goals of care - please notify nephrology if goals do change.  Goals seem in flux at this time Chronic COVID infection / FTT - pt was COVID + first during May 2022 admit, has been testing + repeatedly since then. Negative testing for COVID antibodies per charting FTT - appreciate palliative efforts, working w/ family Acute/ subactue R frontal lobe contusion: per Nsurg no surg needed CLL/ chronic thrombocytopenia - f/u [facility]. Last seen in June, 2022 by [facility] and plans were to stop zanubrutinib and initiate hospice care.  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 HTN/ volume - under dry wt slightly. No vol excess on exam, BP's up, on home norvasc and note hydralazine added but she missed two doses of hydralazine on 9/13 - perhaps due to HD but not given afterward    Disposition - per primary team. Appreciate primary and palliative.  Appreciate their assistance with goals of care; discussions ongoing   Recent Labs  Lab 06/29/21 0307 06/29/21 0558 07/02/21 1000 07/03/21 0118 07/05/21 0026  K  --    < > 3.6 3.8 3.7  BUN  --    < >  21 12 14   CREATININE  --    < > 6.74* 4.66* 4.41*  CALCIUM  --    < > 9.2 8.9 9.2  PHOS  --    < > 5.1*  --  3.4  HGB 13.7  --  14.9  --   --    < > = values in this interval not displayed.   Inpatient medications:  amLODipine  10 mg Oral Daily   carvedilol  12.5 mg Oral BID WC   Chlorhexidine Gluconate Cloth  6 each Topical Q0600   Chlorhexidine Gluconate Cloth  6 each Topical Q0600   doxercalciferol  2 mcg Intravenous Q T,Th,Sa-HD   hydrALAZINE  50 mg Oral Q8H   insulin aspart  0-6 Units Subcutaneous TID WC    levETIRAcetam 250 mg (07/04/21 1912)   levETIRAcetam 500 mg (07/05/21 1049)   acetaminophen **OR** acetaminophen, diphenhydrAMINE, hydrALAZINE, HYDROmorphone (DILAUDID) injection, LORazepam, ondansetron **OR** ondansetron  (ZOFRAN) IV, oxyCODONE    Claudia Desanctis, MD 07/05/2021 12:00 PM

## 2021-07-05 NOTE — Progress Notes (Signed)
PROGRESS NOTE        PATIENT DETAILS Name: Christine Gibson Age: 70 y.o. Sex: female Date of Birth: 08-16-1951 Admit Date: 06/26/2021 Admitting Physician Etta Quill, DO BWL:SLHTD, Alyson Locket, NP  Brief Narrative: Patient is a 70 y.o. female ESRD on HD TTS, CLL, DM-2, HTN, HLD, recent CVA in July-presented to the hospital with confusion and low-grade fever.  Significant events: 9/5>> admit-for evaluation of confusion-MRI brain with acute/subacute right frontal hemorrhage/contusion. 9/7>>  witnessed seizure by nursing staff  Significant studies: 9/5>> CXR: Right midlung infiltrate. 9/6>> MRI brain: Anterior/inferior right frontal lobe-likely contusion. 9/6>> EEG: No seizures-moderate diffuse encephalopathy 9/6>> Serum SARS-COV-2 antibody IgG: Not detected. 9/8>> EEG: Generalized periodic discharges-ictal-interictal continuum with high potential for seizures  Antimicrobial therapy: Vancomycin: 9/5 x 1 Cefepime: 9/5>> 9/7 Zithromax: 9/5>> 9/7 Unasyn: 9/8>>  Microbiology data: 5/25>> COVID screening test positive at outside facility (per prior records) 7/19>> COVID PCR: Positive (CT value 24.4) 9/05>> COVID PCR: Positive (CT value 20.4) 9/05>> blood culture: 1/2 staff epidermidis (likely a contaminant)  9/07>> blood culture: No growth   Procedures : None  Consults: Renal Neurology Neurosurgery Palliative care  DVT Prophylaxis : SCDs Start: 06/26/21 2226   Subjective:  Patient in no distress but remains confused and has expressive, denies any headache chest or abdominal pain.  Assessment/Plan:  Acute metabolic encephalopathy: Mental status continues to wax and-but mostly confused-perseverating at times.  Multifactorial etiology-from recent CVA/contusion/seizures/COVID-19 infection/aspiration pneumonia.  Her prognosis is very poor-palliative care following-and engaging with family.  Unfortunately husband seems to have very poor understanding  of her prognosis.  She is DNR, had a detailed DW husband on 07/02/21 2pm, now understands that prognosis is poor, confirmed DNR goal keep patient comfortable, will try and >> Hospice in a few days.  Discussed with him again on 07/05/2019, continue HD but getting close to deciding about residential hospice and cessation of future HD treatments, he says he wants a few more days to make up his side.   Acute/subacute right frontal lobe contusion: Recommendations from neurosurgery are to continue supportive care.  From my conversation with patient's spouse apparently patient did fall and hit her head sometime in early August.    New onset seizures: Likely due to recent CVA/frontal lobe contusion seen on MRI.  Started on Keppra after discussion with neurology on 9/8.  Repeat EEG on 9/8 with abnormal waves that places the patient at high risk for seizures-neurology planning LTM EEG.  Chronic lymphocytic leukemia with anemia/thrombocytopenia: She follows with oncology at [facility] last oncology note on 04/07/2021-plans were to stop zanubrutinib and initiate hospice care.   Aspiration pneumonia: Continue empiric antimicrobial therapy.  Her CT/chest x-ray imaging is more consistent with aspiration pneumonia rather than COVID-19 pneumonia.  Finished Unasyn course..   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  Review of records from [facility] that she received a dose of Evusheld in January 2022.  Unclear whether she was still taking zanubrutinib prior to this hospitalization as well.  She received a monoclonal antibody infusion on 9/6.  ESRD: On HD TTS-nephrology following for HD needs.  Anemia: Likely multifactorial due  to CLL and ESRD.   Transfused 1 unit of PRBC on 9/7.  Follow CBC.    Thrombocytopenia: Likely due to CLL-platelet count fluctuating-  Not sure if any further platelet transfusion is going to be beneficial given that she appears to have advanced CLL (transfused 1 unit of pheresis platelets on 9/6).  Follow CBC periodically.  CVA: Hold antiplatelets due to hemorrhage/contusion seen on MRI brain.  HTN: BP high on Coreg +  amlodipine-add Hydralazine.  Hard of hearing  Palliative care: Long discussion with patient's spouse over the phone on 9/9-he had a long meeting with palliative care and Dr. Jonnie Finner on 9/8.  He seems to be very understanding that patient will likely continue to deteriorate-and acknowledges that she may be suffering.  After extensive discussion-he was agreeable to a DNR order.  I have asked him to consider stopping HD and perhaps transition to full comfort measures/residential hospice.  DM-2 (A1c 6.0): Diet controlled at home-start SSI and follow  CBG (last 3)  Recent Labs    07/04/21 2007 07/05/21 0941 07/05/21 1231  GLUCAP 185* 156* 122*    Diet: Diet Order             DIET DYS 3 Room service appropriate? Yes with Assist; Fluid consistency: Thin  Diet effective now                    Code Status: DNR  Family Communication: Spoke with Spouse [name]-[phone]-over phone on 07/02/21 - in detail, understands that she is now dying.  Disposition Plan: Status is: Inpatient  Remains inpatient appropriate because:Inpatient level of care appropriate due to severity of illness  Dispo: The patient is from: Home              Anticipated d/c is to: Home              Patient currently is not medically stable to d/c.   Difficult to place patient No    Barriers to Discharge: Encephalopathic-recurrent COVID-19 infection-not yet stable for discharge-see above documentation.  Antimicrobial agents: Anti-infectives (From admission, onward)    Start     Dose/Rate Route  Frequency Ordered Stop   06/29/21 2200  Ampicillin-Sulbactam (UNASYN) 3 g in sodium chloride 0.9 % 100 mL IVPB  Status:  Discontinued        3 g 200 mL/hr over 30 Minutes Intravenous Every 24 hours 06/29/21 1424 07/03/21 1026   06/27/21 2100  ceFEPIme (MAXIPIME) 1 g in sodium chloride 0.9 % 100 mL IVPB  Status:  Discontinued        1 g 200 mL/hr over 30 Minutes Intravenous Every 24 hours 06/26/21 1949 06/29/21 0749   06/26/21 2230  azithromycin (ZITHROMAX) 500 mg in sodium chloride 0.9 % 250 mL IVPB  Status:  Discontinued        500 mg 250 mL/hr over 60 Minutes Intravenous Every 24 hours 06/26/21 2221 06/29/21 0749   06/26/21 1930  vancomycin (VANCOCIN) IVPB 1000 mg/200 mL premix        1,000 mg 200 mL/hr over 60 Minutes Intravenous  Once 06/26/21 1846 06/26/21 2052   06/26/21 1900  ceFEPIme (MAXIPIME) 2 g in sodium chloride 0.9 % 100 mL IVPB        2 g 200 mL/hr over 30 Minutes Intravenous  Once 06/26/21 1846 06/26/21 1937   06/26/21 1845  vancomycin variable dose per unstable renal function (pharmacist dosing)  Status:  Discontinued         Does  not apply See admin instructions 06/26/21 1846 06/26/21 2221        Time spent: 35 minutes-Greater than 50% of this time was spent in counseling, explanation of diagnosis, planning of further management, and coordination of care.  MEDICATIONS: Scheduled Meds:  amLODipine  10 mg Oral Daily   carvedilol  12.5 mg Oral BID WC   Chlorhexidine Gluconate Cloth  6 each Topical Q0600   Chlorhexidine Gluconate Cloth  6 each Topical Q0600   doxercalciferol  2 mcg Intravenous Q T,Th,Sa-HD   hydrALAZINE  50 mg Oral Q8H   insulin aspart  0-6 Units Subcutaneous TID WC   Continuous Infusions:  levETIRAcetam 250 mg (07/04/21 1912)   levETIRAcetam 500 mg (07/05/21 1049)   PRN Meds:.acetaminophen **OR** acetaminophen, diphenhydrAMINE, hydrALAZINE, HYDROmorphone (DILAUDID) injection, LORazepam, ondansetron **OR** ondansetron (ZOFRAN) IV,  oxyCODONE   PHYSICAL EXAM: Vital signs: Vitals:   07/04/21 1355 07/04/21 2008 07/05/21 0406 07/05/21 0936  BP: (!) 147/75 (!) 136/59  (!) 168/71  Pulse: 88 83  73  Resp: 20 18  20   Temp: 97.6 F (36.4 C) 97.6 F (36.4 C)  98.8 F (37.1 C)  TempSrc: Axillary Oral  Oral  SpO2: 98% 96%  94%  Weight: 38.7 kg  38.6 kg    Filed Weights   07/04/21 1055 07/04/21 1355 07/05/21 0406  Weight: 39.6 kg 38.7 kg 38.6 kg   Body mass index is 17.19 kg/m.    Exam  Awake but not alert ++ expressive aphasia, appears weak and cachectic  [place].AT,PERRAL Supple Neck,No JVD, No cervical lymphadenopathy appriciated.  Symmetrical Chest wall movement, Good air movement bilaterally, CTAB RRR,No Gallops, Rubs or new Murmurs, No Parasternal Heave +ve B.Sounds, Abd Soft, No tenderness, No organomegaly appriciated, No rebound - guarding or rigidity. No Cyanosis, Clubbing or edema, No new Rash or bruise     I have personally reviewed following labs and imaging studies  LABORATORY DATA: CBC: Recent Labs  Lab 06/29/21 0307 07/02/21 1000  WBC 4.7 16.4*  HGB 13.7 14.9  HCT 42.9 48.0*  MCV 85.6 85.4  PLT 41* 55*    Basic Metabolic Panel: Recent Labs  Lab 06/29/21 0558 07/02/21 1000 07/03/21 0118 07/05/21 0026  NA 140 142 139 141  K 3.7 3.6 3.8 3.7  CL 101 101 100 100  CO2 25 23 22 27   GLUCOSE 80 91 77 113*  BUN 17 21 12 14   CREATININE 5.00* 6.74* 4.66* 4.41*  CALCIUM 7.8* 9.2 8.9 9.2  MG 1.7  --   --   --   PHOS 4.6 5.1*  --  3.4    GFR: Estimated Creatinine Clearance: 7.1 mL/min (A) (by C-G formula based on SCr of 4.41 mg/dL (H)).  Liver Function Tests: Recent Labs  Lab 06/29/21 0558 07/02/21 1000 07/05/21 0026  ALBUMIN 2.3* 2.4* 2.6*   No results for input(s): LIPASE, AMYLASE in the last 168 hours. No results for input(s): AMMONIA in the last 168 hours.   Coagulation Profile: No results for input(s): INR, PROTIME in the last 168 hours.   Cardiac Enzymes: No  results for input(s): CKTOTAL, CKMB, CKMBINDEX, TROPONINI in the last 168 hours.  BNP (last 3 results) No results for input(s): PROBNP in the last 8760 hours.  Lipid Profile: No results for input(s): CHOL, HDL, LDLCALC, TRIG, CHOLHDL, LDLDIRECT in the last 72 hours.  Thyroid Function Tests: No results for input(s): TSH, T4TOTAL, FREET4, T3FREE, THYROIDAB in the last 72 hours.   Anemia Panel: No results for input(s): VITAMINB12, FOLATE,  FERRITIN, TIBC, IRON, RETICCTPCT in the last 72 hours.   Urine analysis:    Component Value Date/Time   COLORURINE STRAW (A) 01/12/2019 1130   APPEARANCEUR CLEAR 01/12/2019 1130   LABSPEC 1.007 01/12/2019 1130   PHURINE 7.0 01/12/2019 1130   GLUCOSEU 150 (A) 01/12/2019 1130   HGBUR NEGATIVE 01/12/2019 1130   BILIRUBINUR NEGATIVE 01/12/2019 1130   KETONESUR NEGATIVE 01/12/2019 1130   PROTEINUR >=300 (A) 01/12/2019 1130   NITRITE NEGATIVE 01/12/2019 1130   LEUKOCYTESUR NEGATIVE 01/12/2019 1130    Sepsis Labs: Lactic Acid, Venous    Component Value Date/Time   LATICACIDVEN 1.1 06/26/2021 2204     RADIOLOGY STUDIES/RESULTS: No results found.   LOS: 9 days   Signature  Lala Lund M.D on 07/05/2021 at 12:56 PM   -  To page go to [email or web address]

## 2021-07-05 NOTE — Plan of Care (Signed)
  Problem: Health Behavior/Discharge Planning: Goal: Ability to manage health-related needs will improve Outcome: Progressing   Problem: Coping: Goal: Ability to identify and develop effective coping behavior will improve Outcome: Progressing

## 2021-07-05 NOTE — Progress Notes (Signed)
Occupational Therapy Discharge Patient Details Name: Christine Gibson MRN: [id number] DOB: September 03, 1951 Today's Date: 07/05/2021 Time:  -     Patient discharged from OT services secondary to medical decline - will need to re-order OT to resume therapy services.  Patient is now comfort care.  Patient unable to participate in discharge planning and no caregivers available  [place] OT/L  [place] 07/05/2021, 2:41 PM

## 2021-07-05 NOTE — Plan of Care (Signed)
  Problem: Coping: Goal: Ability to identify and develop effective coping behavior will improve Outcome: Progressing   

## 2021-07-06 DIAGNOSIS — I639 Cerebral infarction, unspecified: Secondary | ICD-10-CM | POA: Diagnosis not present

## 2021-07-06 DIAGNOSIS — C9110 Chronic lymphocytic leukemia of B-cell type not having achieved remission: Secondary | ICD-10-CM | POA: Diagnosis not present

## 2021-07-06 DIAGNOSIS — U071 COVID-19: Secondary | ICD-10-CM | POA: Diagnosis not present

## 2021-07-06 DIAGNOSIS — G9340 Encephalopathy, unspecified: Secondary | ICD-10-CM | POA: Diagnosis not present

## 2021-07-06 LAB — RENAL FUNCTION PANEL
Albumin: 2.7 g/dL — ABNORMAL LOW (ref 3.5–5.0)
Anion gap: 16 — ABNORMAL HIGH (ref 5–15)
BUN: 26 mg/dL — ABNORMAL HIGH (ref 8–23)
CO2: 24 mmol/L (ref 22–32)
Calcium: 9.6 mg/dL (ref 8.9–10.3)
Chloride: 102 mmol/L (ref 98–111)
Creatinine, Ser: 6.72 mg/dL — ABNORMAL HIGH (ref 0.44–1.00)
GFR, Estimated: 6 mL/min — ABNORMAL LOW (ref >60)
Glucose, Bld: 101 mg/dL — ABNORMAL HIGH (ref 70–99)
Phosphorus: 4.4 mg/dL (ref 2.5–4.6)
Potassium: 4.5 mmol/L (ref 3.5–5.1)
Sodium: 142 mmol/L (ref 135–145)

## 2021-07-06 LAB — GLUCOSE, CAPILLARY
Glucose-Capillary: 117 mg/dL — ABNORMAL HIGH (ref 70–99)
Glucose-Capillary: 104 mg/dL — ABNORMAL HIGH (ref 70–99)
Glucose-Capillary: 174 mg/dL — ABNORMAL HIGH (ref 70–99)
Glucose-Capillary: 135 mg/dL — ABNORMAL HIGH (ref 70–99)

## 2021-07-06 NOTE — Progress Notes (Signed)
Daily Progress Note   Patient Name: Christine Gibson       Date: 07/06/2021 DOB: December 25, 1950  Age: 70 y.o. MRN#: [id number] Attending Physician: Thurnell Lose, MD Primary Care Physician: Michela Pitcher, NP Admit Date: 06/26/2021  Reason for Consultation/Follow-up: Establishing goals of care  Subjective: Medical records reviewed. I met with patient's husband [name] at the bedside to provide support and continue goals of care conversation. I was also joined by palliative team chaplain Sallyanne Kuster.   [name] has been reflecting heavily on his faith and shares that he feels pressured at times to "just let her go." He again shares that he understands she is dying, although uncertain of when, and he wishes to support her as best as he can. She had a good day yesterday and this has caused him to reconsider a comfort-focused care approach. He does not want to give up on her. Emotional support and therapeutic listening was provided. Discussed the option of going home vs SNF upon discharge. This decision overwhelms [name] as well, but he acknowledges that he is unable to care for her at home any longer. We discussed the possibility of further decline and suffering at SNF, which may necessitate further consideration of [facility]. He continues to voice preference for watchful waiting while continuing medical interventions. He would like to see how Lacie does after HD today.  Questions and concerns addressed. PMT will continue to support holistically.  Length of Stay: 10  Current Medications: Scheduled Meds:   amLODipine  10 mg Oral Daily   carvedilol  12.5 mg Oral BID WC   Chlorhexidine Gluconate Cloth  6 each Topical Q0600   Chlorhexidine Gluconate Cloth  6 each Topical Q0600   Chlorhexidine  Gluconate Cloth  6 each Topical Q0600   doxercalciferol  2 mcg Intravenous Q T,Th,Sa-HD   hydrALAZINE  50 mg Oral Q8H   insulin aspart  0-6 Units Subcutaneous TID WC    Continuous Infusions:  levETIRAcetam 250 mg (07/04/21 1912)   levETIRAcetam 500 mg (07/06/21 1106)    PRN Meds: acetaminophen **OR** acetaminophen, diphenhydrAMINE, hydrALAZINE, HYDROmorphone (DILAUDID) injection, LORazepam, ondansetron **OR** ondansetron (ZOFRAN) IV, oxyCODONE            Vital Signs: BP 110/70 (BP Location: Right Wrist)   Pulse 83   Temp 98.1 F (36.7  C) (Oral)   Resp 15   Wt 38 kg   LMP  (LMP Unknown)   SpO2 98%   BMI 16.92 kg/m  SpO2: SpO2: 98 % O2 Device: O2 Device: Room Air O2 Flow Rate: O2 Flow Rate (L/min): 1 L/min  Intake/output summary:  Intake/Output Summary (Last 24 hours) at 07/06/2021 1331 Last data filed at 07/06/2021 0956 Gross per 24 hour  Intake --  Output 1 ml  Net -1 ml    LBM: Last BM Date: 07/04/21 Baseline Weight: Weight: 44.5 kg Most recent weight: Weight: 38 kg       Palliative Assessment/Data: 20%      Patient Active Problem List   Diagnosis Date Noted   Goals of care, counseling/discussion    Palliative care by specialist    ICH (intracerebral hemorrhage) ([place]) 06/27/2021   Acute encephalopathy 06/26/2021   Community acquired pneumonia of right middle lobe of lung 06/26/2021   Sore in nostril 05/31/2021   COVID-19 05/13/2021   ESRD needing dialysis ([place]) 05/13/2021   Acute CVA (cerebrovascular accident) ([place]) 05/09/2021   Thrombocytopenia ([place])    Iron deficiency anemia 01/06/2021   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/2021   CKD stage 5 secondary to hypertension ([place]) 07/29/2016   HLD (hyperlipidemia) 07/19/2015   HTN (hypertension) 07/19/2015   DM (diabetes mellitus), type 2 ([place]) 07/19/2015   CLL (chronic lymphocytic leukemia) ([place])     Palliative Care Assessment & Plan   Patient Profile: 70 y.o. 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   MRI brain showed acute/subacute right frontal hemorrhage/contusion. CXR consistent with aspiration PNA. Patient has also had recurrent COVID-19 infection for several months, with no immune response to infection or vaccinations likely due to CLL. Palliative care has been consulted to assist with goals of care conversation.  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 ESRD on HD Goals of care conversation  Recommendations/Plan: DNR Goals are no longer aligned with comfort-focused care at this time, continue watchful waiting and all indicated life-prolonging interventions including HD today [name] is hopeful for improvement of her mental status and understands overall poor prognosis  Psychosocial and emotional support provided Ongoing support from PMT  Goals of Care and Additional Recommendations: Limitations on Scope of Treatment: Full Scope Treatment  Prognosis:  Poor prognosis given multiple comorbidities and nutritional/functional/cognitive decline  Discharge Planning: [place] for rehab with Palliative care service follow-up  Care plan was discussed with patient's husband  Total time: 25 minutes Greater than 50% of this time was spent in counseling and coordinating care related to the above assessment and plan.  Dorthy Cooler, PA-C Palliative Medicine Team Team phone # [phone]  Thank you for allowing the Palliative Medicine Team to assist in the care of this patient. Please utilize secure chat with additional questions, if there is no response within 30 minutes please call the above phone number.  Palliative Medicine Team providers are available by phone from 7am to 7pm daily and can be reached through the team  cell phone.  Should this patient require assistance outside of these hours, please call the patient's attending physician.
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
## 2021-07-06 NOTE — Progress Notes (Signed)
This chaplain responded to PMT consult for spiritual care. More specifically support and companionship for the Pt. husband-[name].   The chaplain joined PMT PA Josseline Cooper outside the Pt. room and then moved to the room to greet the Pt.  The chaplain understands the Pt. is very hard of hearing and responded to the chaplain with her hearing aids on.  The Pt. told [name] she wanted to proceed with HD and I love you.   Through reflective listening, the chaplain understands [name] is hoping for communication from the Pt. and leaning on his faith to guide his healthcare decision making process for the Pt.  [name] is open to the chaplain's prayer and F/U spiritual care.  [name] shares his appreciation of the support he is receiving from the PMT.

## 2021-07-06 NOTE — Plan of Care (Signed)
  Problem: Health Behavior/Discharge Planning: Goal: Ability to manage health-related needs will improve Outcome: Progressing   Problem: Coping: Goal: Ability to identify and develop effective coping behavior will improve Outcome: Progressing

## 2021-07-06 NOTE — Progress Notes (Signed)
[facility] Progress Note  Subjective:  Last HD on 9/13 with 1 kg UF.  her husband is still discussing goals with palliative and primary team per charting.  He is not at bedside at this time  Review of systems:  Limited given ams  Denies overt shortness of breath   Vitals:   07/05/21 1329 07/05/21 2027 07/06/21 0406 07/06/21 0911  BP: (!) 175/82 (!) 161/81  (!) 158/64  Pulse: 86   82  Resp: 18 18  15   Temp: 97.9 F (36.6 C) 99.2 F (37.3 C)  98.3 F (36.8 C)  TempSrc: Oral Oral  Axillary  SpO2: 95%   93%  Weight:   38 kg     Exam General adult female in bed, chronically ill appearing no acute distress HEENT normocephalic atraumatic  Neck supple trachea midline Lungs clear to auscultation bilaterally normal work of breathing at rest on room air Heart S1S2 no rub Abdomen soft nontender nondistended Extremities no edema  Psych - no agitation  Neuro - confused; turns head states her name. States year is "22." And repeats this a few times   Access:   L arm AVG+bruit      per charting Home meds include norvasc, lipitor, coreg, ativan prn, losartan, roxanol prn, adalat 60 qd, oxy IR prn, protonix, zoloft, Zanubrutinib tiw po, prn's    CXR - IMPRESSION: 1. New right midlung infiltrate. Questionable new right hilar enlargement. Findings may be related to pneumonia with right hilar lymphadenopathy. Short-term follow-up two views chest and/or CT recommended to exclude underlying mass at this level.    OP HD: TTS [place]  3.5h  41.5kg  400/500  3K/2.5 bath    L AVG  Hep NONE (due to low plts)  - hect 2 ug tiw  - mircera 200 q2 last 8/30     Assessment/ Plan: Aspiration PNA - RML, on IV unasyn  AMS - recent CVA, contusion, seizures, COVID-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  More interactive today.  She has a poor prognosis. Nephrology has charted support for transition to comfort care/ hospice  ESRD - on HD TTS schedule.  Continue HD as aligns with goals of care -  please notify nephrology if goals do change.  Goals seem in flux at this time.  HD today.  Ordered renal panel Chronic COVID infection / FTT - pt was COVID + first during May 2022 admit, has been testing + repeatedly since then. Negative testing for COVID antibodies per charting FTT - appreciate palliative efforts, working w/ family Acute/ subactue R frontal lobe contusion: per Nsurg no surg needed CLL/ chronic thrombocytopenia - f/u [facility]. Last seen in June, 2022 by [facility] and plans were to stop zanubrutinib and initiate hospice care.  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 phos ok HTN/ volume - under dry wt slightly. No vol excess on exam, BP's up, on home norvasc and note hydralazine added - has missed some doses perhaps due to HD   Disposition - per primary team. Appreciate primary and palliative assistance with goals of care; discussions ongoing   Recent Labs  Lab 07/02/21 1000 07/03/21 0118 07/05/21 0026  K 3.6 3.8 3.7  BUN 21 12 14   CREATININE 6.74* 4.66* 4.41*  CALCIUM 9.2 8.9 9.2  PHOS 5.1*  --  3.4  HGB 14.9  --   --    Inpatient medications:  amLODipine  10 mg Oral Daily   carvedilol  12.5 mg Oral BID WC   Chlorhexidine Gluconate Cloth  6 each Topical Q0600   Chlorhexidine Gluconate Cloth  6 each Topical Q0600   Chlorhexidine Gluconate Cloth  6 each Topical Q0600   doxercalciferol  2 mcg Intravenous Q T,Th,Sa-HD   hydrALAZINE  50 mg Oral Q8H   insulin aspart  0-6 Units Subcutaneous TID WC    levETIRAcetam 250 mg (07/04/21 1912)   levETIRAcetam 500 mg (07/05/21 1049)   acetaminophen **OR** acetaminophen, diphenhydrAMINE, hydrALAZINE, HYDROmorphone (DILAUDID) injection, LORazepam, ondansetron **OR** ondansetron (ZOFRAN) IV, oxyCODONE    Claudia Desanctis, MD 07/06/2021 10:41 AM

## 2021-07-07 DIAGNOSIS — R52 Pain, unspecified: Secondary | ICD-10-CM | POA: Diagnosis not present

## 2021-07-07 DIAGNOSIS — U071 COVID-19: Secondary | ICD-10-CM | POA: Diagnosis not present

## 2021-07-07 DIAGNOSIS — A630 Anogenital (venereal) warts: Secondary | ICD-10-CM

## 2021-07-07 DIAGNOSIS — I639 Cerebral infarction, unspecified: Secondary | ICD-10-CM | POA: Diagnosis not present

## 2021-07-07 DIAGNOSIS — Z66 Do not resuscitate: Secondary | ICD-10-CM

## 2021-07-07 DIAGNOSIS — G9340 Encephalopathy, unspecified: Secondary | ICD-10-CM | POA: Diagnosis not present

## 2021-07-07 LAB — GLUCOSE, CAPILLARY
Glucose-Capillary: 107 mg/dL — ABNORMAL HIGH (ref 70–99)
Glucose-Capillary: 197 mg/dL — ABNORMAL HIGH (ref 70–99)
Glucose-Capillary: 118 mg/dL — ABNORMAL HIGH (ref 70–99)
Glucose-Capillary: 110 mg/dL — ABNORMAL HIGH (ref 70–99)

## 2021-07-07 MED ORDER — CHLORHEXIDINE GLUCONATE CLOTH 2 % EX PADS
6.0000 | MEDICATED_PAD | Freq: Every day | CUTANEOUS | Status: DC
  Administered 2021-07-08 – 2021-07-16 (×6): 6 via TOPICAL

## 2021-07-07 MED ORDER — DIPHENHYDRAMINE HCL 50 MG/ML IJ SOLN
25.0000 mg | Freq: Once | INTRAMUSCULAR | Status: AC
  Administered 2021-07-07: 25 mg via INTRAVENOUS
  Filled 2021-07-07: qty 1

## 2021-07-07 MED ORDER — HYDROCORT-PRAMOXINE (PERIANAL) 1-1 % EX FOAM
1.0000 | Freq: Four times a day (QID) | CUTANEOUS | Status: DC | PRN
  Filled 2021-07-07: qty 10
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
## 2021-07-07 NOTE — Progress Notes (Signed)
Daily Progress Note   Patient Name: Christine Gibson       Date: 07/07/2021 DOB: [birth year]  Age: 70 y.o. MRN#: [id number] Attending Physician: Thurnell Lose, MD Primary Care Physician: Michela Pitcher, NP Admit Date: 06/26/2021  Reason for Consultation/Follow-up: Establishing goals of care  Subjective: Medical records reviewed. Patient is somnolent. I met with patient's husband Christine Gibson outside the patient's room.  Discussed patient's increased lethargy and the complex emotions that accompany caregivers through this difficult process. Christine Gibson feels that she is getting closer to death despite medical interventions. He wonders if her sedation is from pain medications and I provided education that this is likely due to post-dialysis fatigue and overall declining health. He feels that a DNR order was the right thing to do and he laments that he was unable to be with her when she returning from HD last night. He hopes that his son will be able to visit soon and assist him with these difficult decisions. He has a feeling that she is approaching death, but is not ready to transition back to a comfort-focused approach.  Questions and concerns addressed. PMT will continue to support holistically.  Length of Stay: 11  Current Medications: Scheduled Meds:   amLODipine  10 mg Oral Daily   carvedilol  12.5 mg Oral BID WC   Chlorhexidine Gluconate Cloth  6 each Topical Q0600   Chlorhexidine Gluconate Cloth  6 each Topical Q0600   Chlorhexidine Gluconate Cloth  6 each Topical Q0600   doxercalciferol  2 mcg Intravenous Q T,Th,Sa-HD   hydrALAZINE  50 mg Oral Q8H   insulin aspart  0-6 Units Subcutaneous TID WC    Continuous Infusions:  levETIRAcetam 250 mg (07/04/21 1912)   levETIRAcetam 500 mg (07/07/21  1034)    PRN Meds: acetaminophen **OR** acetaminophen, hydrALAZINE, HYDROmorphone (DILAUDID) injection, LORazepam, ondansetron **OR** ondansetron (ZOFRAN) IV, oxyCODONE            Vital Signs: BP (!) 145/73   Pulse 91   Temp 97.6 F (36.4 C) (Oral)   Resp 15   Wt 36.1 kg   LMP  (LMP Unknown)   SpO2 96%   BMI 16.07 kg/m  SpO2: SpO2: 96 % O2 Device: O2 Device: Room Air O2 Flow Rate: O2 Flow Rate (L/min): 1 L/min  Intake/output summary:  Intake/Output  Summary (Last 24 hours) at 07/07/2021 1551 Last data filed at 07/07/2021 1003 Gross per 24 hour  Intake 360 ml  Output 1000 ml  Net -640 ml    LBM: Last BM Date: 07/07/21 Baseline Weight: Weight: 44.5 kg Most recent weight: Weight: 36.1 kg       Palliative Assessment/Data: 20%      Patient Active Problem List   Diagnosis Date Noted   Goals of care, counseling/discussion    Palliative care by specialist    ICH (intracerebral hemorrhage) ([place]) 06/27/2021   Acute encephalopathy 06/26/2021   Community acquired pneumonia of right middle lobe of lung 06/26/2021   Sore in nostril 05/31/2021   COVID-19 05/13/2021   ESRD needing dialysis ([place]) 05/13/2021   Acute CVA (cerebrovascular accident) ([place]) 05/09/2021   Thrombocytopenia ([place])    Iron deficiency anemia 01/06/2021   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/2021   CKD stage 5 secondary to hypertension ([place]) 07/29/2016   HLD (hyperlipidemia) 07/19/2015   HTN (hypertension) 07/19/2015   DM (diabetes mellitus), type 2 ([place]) 07/19/2015   CLL (chronic lymphocytic leukemia) ([place])     Palliative Care Assessment & Plan   Patient Profile: 70 y.o. 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   MRI brain showed acute/subacute right frontal hemorrhage/contusion. CXR consistent with aspiration PNA. Patient has also had recurrent COVID-19 infection for several months, with no  immune response to infection or vaccinations likely due to CLL. Palliative care has been consulted to assist with goals of care conversation.  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 ESRD on HD Goals of care conversation  Recommendations/Plan: [name] understands overall poor prognosis, goal remains to continue life-prolonging interventions and watchful waiting; he would consider declining next HD treatment if she remains somnolent  Psychosocial and emotional support provided, patient's husband has been extensively educated on options for comfort care and hospice Ongoing support from PMT; husband is aware that we have limited weekend availability and will attempt to return calls in a timely manner  Goals of Care and Additional Recommendations: Limitations on Scope of Treatment: Full Scope Treatment  Prognosis:  Poor prognosis given multiple comorbidities and nutritional/functional/cognitive decline  Discharge Planning: [place] for rehab with Palliative care service follow-up  Care plan was discussed with patient's husband Christine Gibson  Total time: 25 minutes Greater than 50% of this time was spent in counseling and coordinating care related to the above assessment and plan.  Christine Cooler, PA-C Palliative Medicine Team Team phone # [phone]  Thank you for allowing the Palliative Medicine Team to assist in the care of this patient. Please utilize secure chat with additional questions, if there is no response within 30 minutes please call the above phone number.  Palliative Medicine Team providers are available by phone from 7am to 7pm daily and can be reached through the team cell phone.  Should this patient require assistance outside of these hours, please call the patient's attending physician.

## 2021-07-07 NOTE — Progress Notes (Signed)
[facility] Progress Note  Subjective:  Last HD on 9/15 with 1 kg UF.  Husband is not at bedside. Per primary team he states that he does not want hospice; team and RN relate that his first wife died after being in hospice care and he found .  I called him and we spoke for several minutes.  He states she recently told him that she wanted to continue dialysis.  He feels compelled to continue until her body is not able to tolerate it or the circumstances worsen.  Per RN large wound on backside - primary does not feel that she would be able to sit for HD  Review of systems:  Limited given ams  Denies overt shortness of breath   Vitals:   07/07/21 0121 07/07/21 0151 07/07/21 0500 07/07/21 0807  BP: 129/80   (!) 159/82  Pulse: 93   93  Resp: 18   15  Temp:  98.8 F (37.1 C)  97.6 F (36.4 C)  TempSrc:  (P) Oral  Oral  SpO2: 93%   98%  Weight:   36.1 kg     Physical Exam General adult female in bed, chronically ill appearing no acute distress HEENT normocephalic atraumatic  Neck supple trachea midline Lungs clear to auscultation bilaterally normal work of breathing at rest on room air Heart S1S2 no rub Abdomen soft nontender nondistended Extremities no edema  Psych - no agitation  Neuro - confused; states her name. States year is "2028th." Access:  L arm AVG+bruit      per charting Home meds include norvasc, lipitor, coreg, ativan prn, losartan, roxanol prn, adalat 60 qd, oxy IR prn, protonix, zoloft, Zanubrutinib tiw po, prn's    CXR - IMPRESSION: 1. New right midlung infiltrate. Questionable new right hilar enlargement. Findings may be related to pneumonia with right hilar lymphadenopathy. Short-term follow-up two views chest and/or CT recommended to exclude underlying mass at this level.    OP HD: TTS [place]  3.5h  41.5kg  400/500  3K/2.5 bath    L AVG  Hep NONE (due to low plts)  - hect 2 ug tiw  - mircera 200 q2 last 8/30     Assessment/  Plan: Aspiration PNA - RML, on IV unasyn   AMS - recent CVA, contusion, seizures, COVID-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  She has a poor prognosis. Nephrology has charted support for transition to comfort care/ hospice   ESRD - on HD TTS schedule.   Continue HD per TTS as aligns with goals of care - please notify nephrology if goals do change.  Goals seem in flux at this time.   renal panel for am   Chronic COVID infection / FTT - pt was COVID + first during May 2022 admit, has been testing + repeatedly since then. Negative testing for COVID antibodies per charting  FTT - appreciate palliative efforts, working w/ family  Acute/ subactue R frontal lobe contusion: per Nsurg no surg needed  CLL/ chronic thrombocytopenia - f/u [facility]. Last seen in June, 2022 by [facility] and plans were to stop zanubrutinib and initiate hospice care.   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 phos ok  HTN/ volume - under dry weight. No vol excess on exam, BP's up, on home norvasc and note hydralazine added    Disposition - per primary team. Appreciate primary and  palliative assistance with goals of care; discussions ongoing.  I called her husband today as well    Recent Labs  Lab 07/02/21 1000 07/03/21 0118 07/05/21 0026 07/06/21 1137  K 3.6   < > 3.7 4.5  BUN 21   < > 14 26*  CREATININE 6.74*   < > 4.41* 6.72*  CALCIUM 9.2   < > 9.2 9.6  PHOS 5.1*  --  3.4 4.4  HGB 14.9  --   --   --    < > = values in this interval not displayed.   Inpatient medications:  amLODipine  10 mg Oral Daily   carvedilol  12.5 mg Oral BID WC   Chlorhexidine Gluconate Cloth  6 each Topical Q0600   Chlorhexidine Gluconate Cloth  6 each Topical Q0600   Chlorhexidine Gluconate Cloth  6 each Topical Q0600   doxercalciferol  2 mcg Intravenous Q T,Th,Sa-HD   hydrALAZINE  50 mg Oral Q8H   insulin aspart  0-6 Units Subcutaneous TID WC     levETIRAcetam 250 mg (07/04/21 1912)   levETIRAcetam 500 mg (07/06/21 1106)   acetaminophen **OR** acetaminophen, hydrALAZINE, HYDROmorphone (DILAUDID) injection, LORazepam, ondansetron **OR** ondansetron (ZOFRAN) IV, oxyCODONE    Claudia Desanctis, MD 07/07/2021 10:13 AM

## 2021-07-08 DIAGNOSIS — I639 Cerebral infarction, unspecified: Secondary | ICD-10-CM | POA: Diagnosis not present

## 2021-07-08 DIAGNOSIS — Z7189 Other specified counseling: Secondary | ICD-10-CM

## 2021-07-08 DIAGNOSIS — C9110 Chronic lymphocytic leukemia of B-cell type not having achieved remission: Secondary | ICD-10-CM | POA: Diagnosis not present

## 2021-07-08 DIAGNOSIS — G9340 Encephalopathy, unspecified: Secondary | ICD-10-CM | POA: Diagnosis not present

## 2021-07-08 DIAGNOSIS — Z515 Encounter for palliative care: Secondary | ICD-10-CM

## 2021-07-08 DIAGNOSIS — U071 COVID-19: Secondary | ICD-10-CM | POA: Diagnosis not present

## 2021-07-08 LAB — RENAL FUNCTION PANEL
Albumin: 2.8 g/dL — ABNORMAL LOW (ref 3.5–5.0)
Anion gap: 20 — ABNORMAL HIGH (ref 5–15)
BUN: 16 mg/dL (ref 8–23)
CO2: 22 mmol/L (ref 22–32)
Calcium: 9.8 mg/dL (ref 8.9–10.3)
Chloride: 96 mmol/L — ABNORMAL LOW (ref 98–111)
Creatinine, Ser: 4.89 mg/dL — ABNORMAL HIGH (ref 0.44–1.00)
GFR, Estimated: 9 mL/min — ABNORMAL LOW (ref >60)
Glucose, Bld: 99 mg/dL (ref 70–99)
Phosphorus: 4 mg/dL (ref 2.5–4.6)
Potassium: 5.2 mmol/L — ABNORMAL HIGH (ref 3.5–5.1)
Sodium: 138 mmol/L (ref 135–145)

## 2021-07-08 LAB — GLUCOSE, CAPILLARY
Glucose-Capillary: 117 mg/dL — ABNORMAL HIGH (ref 70–99)
Glucose-Capillary: 164 mg/dL — ABNORMAL HIGH (ref 70–99)
Glucose-Capillary: 139 mg/dL — ABNORMAL HIGH (ref 70–99)
Glucose-Capillary: 168 mg/dL — ABNORMAL HIGH (ref 70–99)

## 2021-07-08 LAB — CBC
HCT: 49.3 % — ABNORMAL HIGH (ref 36.0–46.0)
HCT: 50.8 % — ABNORMAL HIGH (ref 36.0–46.0)
Hemoglobin: 15.7 g/dL — ABNORMAL HIGH (ref 12.0–15.0)
Hemoglobin: 15.7 g/dL — ABNORMAL HIGH (ref 12.0–15.0)
MCH: 26.7 pg (ref 26.0–34.0)
MCH: 27 pg (ref 26.0–34.0)
MCHC: 31.8 g/dL (ref 30.0–36.0)
MCHC: 30.9 g/dL (ref 30.0–36.0)
MCV: 84 fL (ref 80.0–100.0)
MCV: 87.4 fL (ref 80.0–100.0)
Platelets: 55 10*3/uL — ABNORMAL LOW (ref 150–400)
Platelets: 57 10*3/uL — ABNORMAL LOW (ref 150–400)
RBC: 5.87 MIL/uL — ABNORMAL HIGH (ref 3.87–5.11)
RBC: 5.81 MIL/uL — ABNORMAL HIGH (ref 3.87–5.11)
RDW: 18.6 % — ABNORMAL HIGH (ref 11.5–15.5)
RDW: 18.6 % — ABNORMAL HIGH (ref 11.5–15.5)
WBC: 46.9 10*3/uL — ABNORMAL HIGH (ref 4.0–10.5)
WBC: 58.1 10*3/uL (ref 4.0–10.5)
nRBC: 0.1 % (ref 0.0–0.2)
nRBC: 0.1 % (ref 0.0–0.2)

## 2021-07-08 NOTE — Evaluation (Signed)
Physical Therapy Evaluation Patient Details Name: Christine Gibson MRN: [id number] DOB: August 22, 1951 Today's Date: 07/08/2021  History of Present Illness  Pt is a 70 y/o female admitted 9/5 secondary  to confusion and low-grade fever. MRI showed acute/subacute right frontal hemorrhage/contusion.  On 9/7 pt with witnessed seizure by nursing staff. Imaging also showing PNA. PMH includes CLL, ESRD on HD, CVA.  Clinical Impression  Pt admitted secondary to problem above with deficits below. Pt sleepy throughout, but would wake up when prompted. Pt requiring mod A for assist with rolling for repositioning. Educated husband about repositioning in bed every 2 hours to help with skin integrity. Per notes, awaiting to see pt progress to determine disposition. If plan is not to return with hospice services, will need SNF level therapies. Will continue to follow acutely and update recommendations based on POC.      Recommendations for follow up therapy are one component of a multi-disciplinary discharge planning process, led by the attending physician.  Recommendations may be updated based on patient status, additional functional criteria and insurance authorization.  Follow Up Recommendations SNF;Supervision/Assistance - 24 hour (vs hospice follow)    Equipment Recommendations  Wheelchair cushion (measurements PT);Wheelchair (measurements PT);Hospital bed    Recommendations for Other Services       Precautions / Restrictions Precautions Precautions: Fall Restrictions Weight Bearing Restrictions: No      Mobility  Bed Mobility Overal bed mobility: Needs Assistance Bed Mobility: Rolling Rolling: Mod assist         General bed mobility comments: Mod A for assist with rolling for repositioning. Educated husband about repositioning for comfort. Pt falling asleep easily so further mobility deferred.    Transfers                    Ambulation/Gait                Stairs             Wheelchair Mobility    Modified Rankin (Stroke Patients Only)       Balance                                             Pertinent Vitals/Pain Pain Assessment: No/denies pain    Home Living Family/patient expects to be discharged to:: Skilled nursing facility                      Prior Function Level of Independence: Needs assistance   Gait / Transfers Assistance Needed: Per husband, pt was independent with ambulation, but moved very slow.  ADL's / Homemaking Assistance Needed: Required assist with bathing and dressing.        Hand Dominance        Extremity/Trunk Assessment   Upper Extremity Assessment Upper Extremity Assessment: Defer to OT evaluation    Lower Extremity Assessment Lower Extremity Assessment: Generalized weakness    Cervical / Trunk Assessment Cervical / Trunk Assessment: Kyphotic  Communication   Communication: HOH  Cognition Arousal/Alertness: Lethargic Behavior During Therapy: Flat affect Overall Cognitive Status: Impaired/Different from baseline Area of Impairment: Problem solving;Awareness;Safety/judgement;Memory                     Memory: Decreased short-term memory   Safety/Judgement: Decreased awareness of deficits;Decreased awareness of safety Awareness: Intellectual Problem Solving: Slow processing General Comments: Pt able  to answer question about her name. When asked who her husband was she states "we just stay together" and was not able to report it was her husband. Tangential at times when awake. Would fall asleep easily.      General Comments      Exercises     Assessment/Plan    PT Assessment Patient needs continued PT services  PT Problem List Decreased strength;Decreased activity tolerance;Decreased balance;Decreased mobility;Decreased cognition;Decreased knowledge of use of DME;Decreased safety awareness;Decreased knowledge of precautions;Cardiopulmonary status  limiting activity       PT Treatment Interventions DME instruction;Functional mobility training;Gait training;Therapeutic activities;Therapeutic exercise;Balance training;Patient/family education    PT Goals (Current goals can be found in the Care Plan section)  Acute Rehab PT Goals Patient Stated Goal: none stated PT Goal Formulation: With patient Time For Goal Achievement: 07/22/21 Potential to Achieve Goals: Fair    Frequency Min 1X/week   Barriers to discharge        Co-evaluation               AM-PAC PT "6 Clicks" Mobility  Outcome Measure Help needed turning from your back to your side while in a flat bed without using bedrails?: A Lot Help needed moving from lying on your back to sitting on the side of a flat bed without using bedrails?: A Lot Help needed moving to and from a bed to a chair (including a wheelchair)?: Total Help needed standing up from a chair using your arms (e.g., wheelchair or bedside chair)?: Total Help needed to walk in hospital room?: Total Help needed climbing 3-5 steps with a railing? : Total 6 Click Score: 8    End of Session   Activity Tolerance: Patient limited by lethargy;Patient limited by fatigue Patient left: in bed;with call bell/phone within reach;with family/visitor present;with bed alarm set Nurse Communication: Mobility status PT Visit Diagnosis: Other abnormalities of gait and mobility (R26.89);Difficulty in walking, not elsewhere classified (R26.2);Muscle weakness (generalized) (M62.81)    Time: 2800-3491 PT Time Calculation (min) (ACUTE ONLY): 19 min   Charges:   PT Evaluation $PT Eval Moderate Complexity: 1 Mod          Reuel Derby, PT, DPT  Acute Rehabilitation Services  Pager: [phone] Office: [phone]   Rudean Hitt 07/08/2021, 12:57 PM

## 2021-07-08 NOTE — Progress Notes (Signed)
HD appointment moved to Sunday, patient no longer on contact precautions.

## 2021-07-08 NOTE — Progress Notes (Signed)
Ok to move HD treatment to 07/09/21 per Dr. Jonnie Finner. Orders modified.

## 2021-07-08 NOTE — Progress Notes (Signed)
[facility] Progress Note  Subjective: pt seen in room, she is alert, confused, pleasant  Vitals:   07/07/21 2041 07/08/21 0101 07/08/21 0604 07/08/21 0748  BP: (!) 154/73 139/79 (!) 166/81 (!) 127/45  Pulse: 74 85 86 81  Resp: 15 14 16 18   Temp: 98 F (36.7 C) 98.1 F (36.7 C) 98.6 F (37 C) 97.7 F (36.5 C)  TempSrc: Oral Oral Oral Oral  SpO2: 97% 95% 97% 100%  Weight:        Physical Exam  alert, nad , thin  no jvd  Chest cta bilat  Cor reg no RG  Abd soft ntnd no ascites   Ext no LE edema   Alert, NF, ox3  Access:  L arm AVG+bruit      Home meds include - norvasc, lipitor, coreg, ativan prn, losartan, roxanol prn, adalat 60 qd, oxy IR prn, protonix, zoloft, Zanubrutinib tiw po, prn's    CXR - IMPRESSION: 1. New right midlung infiltrate. Questionable new right hilar enlargement. Findings may be related to pneumonia with right hilar lymphadenopathy. Short-term follow-up two views chest and/or CT recommended to exclude underlying mass at this level.    OP HD: TTS [place]  3.5h  41.5kg  400/500  3K/2.5 bath    L AVG  Hep NONE (due to low plts)  - hect 2 ug tiw  - mircera 200 q2 last 8/30     Assessment/ Plan: Aspiration PNA - RML, on IV unasyn   AMS - recent CVA, contusion, seizures, COVID-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  She has a poor prognosis. Nephrology has charted support for transition to comfort care/ hospice   ESRD - on HD TTS schedule.  Continue HD per TTS as aligns with goals of care - please notify nephrology if goals do change.  Goals seem in flux at this time.    Chronic COVID infection / FTT - pt was COVID + first during May 2022 admit, has been testing + repeatedly since then. Negative testing for COVID antibodies per charting  FTT - appreciate palliative efforts, working w/ family  Acute/ subactue R frontal lobe contusion: per Nsurg no surg needed  CLL/ chronic thrombocytopenia - f/u [facility]. Last seen in June, 2022 by [facility] and  plans were to stop zanubrutinib and initiate hospice care.   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 phos ok  HTN/ volume - under dry weight. No vol excess on exam, BP's up, on home norvasc and note hydralazine added    [name], MD 07/08/2021, 12:49 PM       Recent Labs  Lab 07/06/21 1137 07/08/21 0030 07/08/21 0402 07/08/21 0711  K 4.5 5.2*  --   --   BUN 26* 16  --   --   CREATININE 6.72* 4.89*  --   --   CALCIUM 9.6 9.8  --   --   PHOS 4.4 4.0  --   --   HGB  --   --  15.7* 15.7*    Inpatient medications:  amLODipine  10 mg Oral Daily   carvedilol  12.5 mg Oral BID WC   Chlorhexidine Gluconate Cloth  6 each Topical Q0600   doxercalciferol  2 mcg Intravenous Q T,Th,Sa-HD   hydrALAZINE  50 mg Oral Q8H   insulin aspart  0-6 Units Subcutaneous TID WC    levETIRAcetam 250  mg (07/04/21 1912)   levETIRAcetam 500 mg (07/08/21 0915)   acetaminophen **OR** acetaminophen, hydrALAZINE, hydrocortisone-pramoxine, HYDROmorphone (DILAUDID) injection, LORazepam, ondansetron **OR** ondansetron (ZOFRAN) IV, oxyCODONE    Sol Blazing, MD 07/08/2021 12:46 PM
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
## 2021-07-09 DIAGNOSIS — U071 COVID-19: Secondary | ICD-10-CM | POA: Diagnosis not present

## 2021-07-09 DIAGNOSIS — I639 Cerebral infarction, unspecified: Secondary | ICD-10-CM | POA: Diagnosis not present

## 2021-07-09 DIAGNOSIS — G9340 Encephalopathy, unspecified: Secondary | ICD-10-CM | POA: Diagnosis not present

## 2021-07-09 DIAGNOSIS — A630 Anogenital (venereal) warts: Secondary | ICD-10-CM | POA: Diagnosis not present

## 2021-07-09 LAB — GLUCOSE, CAPILLARY
Glucose-Capillary: 125 mg/dL — ABNORMAL HIGH (ref 70–99)
Glucose-Capillary: 162 mg/dL — ABNORMAL HIGH (ref 70–99)
Glucose-Capillary: 160 mg/dL — ABNORMAL HIGH (ref 70–99)
Glucose-Capillary: 105 mg/dL — ABNORMAL HIGH (ref 70–99)

## 2021-07-09 NOTE — TOC Progression Note (Signed)
Transition of Care [facility]) - Progression Note    Patient Details  Name: Christine Gibson MRN: [id number] Date of Birth: [birth year]  Transition of Care [facility]) CM/SW Contact  Joanne Chars, LCSW Phone Number: 07/09/2021, 3:34 PM  Clinical Narrative:   CSW spoke with husband in pt room, discussed referral for SNF and husband wants to pursue this.  Choice document given, permission given to send referral out in hub.      Expected Discharge Plan: [place] Barriers to Discharge: Continued Medical Work up  Expected Discharge Plan and Services Expected Discharge Plan: [place] In-house Referral: Clinical Social Work, Hospice / [place] Acute Care Choice: Hospice Living arrangements for the past 2 months: Single Family Home                                       Social Determinants of Health (SDOH) Interventions    Readmission Risk Interventions No flowsheet data found.

## 2021-07-09 NOTE — NC FL2 (Signed)
[place] LEVEL OF CARE SCREENING TOOL     IDENTIFICATION  Patient Name: Christine Gibson Birthdate: 1951/07/22 Sex: female Admission Date (Current Location): 06/26/2021  [facility] and [place] Number:  Herbalist and Address:  [facility], [place] [address] [place], [place] [address]      Provider Number: [id number]  Attending Physician Name and Address:  Thurnell Lose, MD  Relative Name and Phone Number:  Kaylena, [place] [phone]    Current Level of Care: Hospital Recommended Level of Care: [place] Prior Approval Number:    Date Approved/Denied:   PASRR Number: [id number]  Discharge Plan: SNF    Current Diagnoses: Patient Active Problem List   Diagnosis Date Noted   Goals of care, counseling/discussion    Palliative care by specialist    ICH (intracerebral hemorrhage) ([place]) 06/27/2021   Acute encephalopathy 06/26/2021   Community acquired pneumonia of right middle lobe of lung 06/26/2021   Sore in nostril 05/31/2021   COVID-19 05/13/2021   ESRD needing dialysis ([place]) 05/13/2021   Acute CVA (cerebrovascular accident) ([place]) 05/09/2021   Thrombocytopenia ([place])    Iron deficiency anemia 01/06/2021   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/2021   CKD stage 5 secondary to hypertension ([place]) 07/29/2016   HLD (hyperlipidemia) 07/19/2015   HTN (hypertension) 07/19/2015   DM (diabetes mellitus), type 2 ([place]) 07/19/2015   CLL (chronic lymphocytic leukemia) ([place])     Orientation RESPIRATION BLADDER Height & Weight     Self  Normal Continent Weight: 81 lb 9.1 oz (37 kg) Height:     BEHAVIORAL SYMPTOMS/MOOD NEUROLOGICAL BOWEL NUTRITION STATUS      Incontinent Diet (see discharge summary)  AMBULATORY STATUS COMMUNICATION OF NEEDS Skin   Total Care Verbally Normal                       Personal Care Assistance Level of Assistance  Total care       Total Care Assistance: Maximum assistance   Functional  Limitations Info  Sight, Hearing, Speech Sight Info: Adequate Hearing Info: Adequate Speech Info: Adequate    SPECIAL CARE FACTORS FREQUENCY  PT (By licensed PT), OT (By licensed OT), Speech therapy     PT Frequency: 5x week OT Frequency: 5x week     Speech Therapy Frequency: 2x week      Contractures      Additional Factors Info  Code Status, Allergies, Insulin Sliding Scale Code Status Info: DNR Allergies Info: Lisinopril   Insulin Sliding Scale Info: Novolog, 0-6 units 3x day with meals.  See discharge summary.       Current Medications (07/09/2021):  This is the current hospital active medication list Current Facility-Administered Medications  Medication Dose Route Frequency Provider Last Rate Last Admin   acetaminophen (TYLENOL) tablet 650 mg  650 mg Oral Q6H PRN Etta Quill, DO       Or   acetaminophen (TYLENOL) suppository 650 mg  650 mg Rectal Q6H PRN Etta Quill, DO       amLODipine (NORVASC) tablet 10 mg  10 mg Oral Daily Jonetta Osgood, MD   10 mg at 07/09/21 0925   carvedilol (COREG) tablet 12.5 mg  12.5 mg Oral BID WC Jonetta Osgood, MD   12.5 mg at 07/09/21 0925   Chlorhexidine Gluconate Cloth 2 % PADS 6 each  6 each Topical Q0600 Claudia Desanctis, MD   6 each at 07/09/21 [phone]  doxercalciferol (HECTOROL) injection 2 mcg  2 mcg Intravenous Q T,Th,Sa-HD Roney Jaffe, MD   2 mcg at 07/02/21 1023   hydrALAZINE (APRESOLINE) injection 10 mg  10 mg Intravenous Q6H PRN Jonetta Osgood, MD   10 mg at 06/30/21 1501   hydrALAZINE (APRESOLINE) tablet 50 mg  50 mg Oral Q8H Thurnell Lose, MD   50 mg at 07/09/21 1500   hydrocortisone-pramoxine (PROCTOFOAM-HC) rectal foam 1 applicator  1 applicator Rectal J5K PRN Thurnell Lose, MD       HYDROmorphone (DILAUDID) injection 0.5 mg  0.5 mg Intravenous Q2H PRN Cooper, Josseline P, PA-C   0.5 mg at 07/08/21 1527   insulin aspart (novoLOG) injection 0-6 Units  0-6 Units Subcutaneous TID WC Jonetta Osgood, MD   1 Units at 07/09/21 1300   levETIRAcetam (KEPPRA) 250 mg in sodium chloride 0.9 % 100 mL IVPB  250 mg Intravenous Q T,Th,Sa-HD Kerney Elbe, MD   Held at 07/08/21 1530   levETIRAcetam (KEPPRA) IVPB 500 mg/100 mL premix  500 mg Intravenous Daily Jonetta Osgood, MD 400 mL/hr at 07/09/21 0929 500 mg at 07/09/21 0929   LORazepam (ATIVAN) injection 1 mg  1 mg Intravenous Q4H PRN Gardiner Barefoot, NP       ondansetron [facility]) tablet 4 mg  4 mg Oral Q6H PRN Etta Quill, DO       Or   ondansetron [facility]) injection 4 mg  4 mg Intravenous Q6H PRN Etta Quill, DO       oxyCODONE (Oxy IR/ROXICODONE) immediate release tablet 5 mg  5 mg Oral Q4H PRN Cooper, Josseline P, PA-C   5 mg at 07/09/21 1506     Discharge Medications: Please see discharge summary for a list of discharge medications.  Relevant Imaging Results:  Relevant Lab Results:   Additional Information SS#: [id number]--BZJ is HD patient. [facility] to pay for transport to HD. VA will cover the cost of SNF stay--has been approved  Kiala Faraj, Veronia Beets, LCSW

## 2021-07-09 NOTE — Progress Notes (Signed)
[facility] Progress Note   Subjective:  Seen in room. Pleasant, answers questions, but confused to surroundings.  Dialysis moved to today d/t staffing constraints.    Objective Vitals:   07/08/21 2307 07/09/21 0509 07/09/21 0536 07/09/21 0838  BP: (!) 183/69  (!) 158/70 (!) 164/72  Pulse: 78  84 84  Resp: 17  16 18   Temp: 97.7 F (36.5 C)  98.4 F (36.9 C) 98.5 F (36.9 C)  TempSrc: Oral  Axillary Oral  SpO2: 95%  100% 96%  Weight:  37 kg        Additional Objective Labs: Basic Metabolic Panel: Recent Labs  Lab 07/05/21 0026 07/06/21 1137 07/08/21 0030  NA 141 142 138  K 3.7 4.5 5.2*  CL 100 102 96*  CO2 27 24 22   GLUCOSE 113* 101* 99  BUN 14 26* 16  CREATININE 4.41* 6.72* 4.89*  CALCIUM 9.2 9.6 9.8  PHOS 3.4 4.4 4.0   CBC: Recent Labs  Lab 07/08/21 0402 07/08/21 0711  WBC 46.9* 58.1*  HGB 15.7* 15.7*  HCT 49.3* 50.8*  MCV 84.0 87.4  PLT 55* 57*   Blood Culture    Component Value Date/Time   SDES BLOOD RIGHT WRIST 06/28/2021 1212   SPECREQUEST  06/28/2021 1212    BOTTLES DRAWN AEROBIC AND ANAEROBIC Blood Culture adequate volume   CULT  06/28/2021 1212    NO GROWTH 5 DAYS Performed at [facility], [place] [address] [place], [place] [address]    REPTSTATUS 07/03/2021 FINAL 06/28/2021 1212     Physical Exam General: Elderly woman, very frail appearing, nad  Heart: RRR No rub  Lungs: Clear anteriorly  Abdomen: soft non-distended  Extremities: No LE edema  Dialysis Access: LUE AVG +bruit   Medications:  levETIRAcetam Stopped (07/08/21 1530)   levETIRAcetam 500 mg (07/09/21 0929)    amLODipine  10 mg Oral Daily   carvedilol  12.5 mg Oral BID WC   Chlorhexidine Gluconate Cloth  6 each Topical Q0600   doxercalciferol  2 mcg Intravenous Q T,Th,Sa-HD   hydrALAZINE  50 mg Oral Q8H   insulin aspart  0-6 Units Subcutaneous TID WC    Dialysis Orders:  [facility]  3.5h  41.5kg  400/500  3K/2.5 bath    L AVG  Hep NONE (due  to low plts)  - hect 2 ug tiw  - mircera 200 q2 last 8/30  Assessment/Plan: Aspiration PNA - RML   s/p course of IV unasyn    AMS - recent CVA, contusion, seizures, COVID-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  She has a poor prognosis. Nephrology has charted support for transition to comfort care/ hospice    ESRD - on HD TTS schedule.  Continue HD per TTS as aligns with goals of care - please notify nephrology if goals do change.  Goals seem in flux at this time.     Chronic COVID infection / FTT - pt was COVID + first during May 2022 admit, has been testing + repeatedly since then. Negative testing for COVID antibodies per charting. Off isolation precautions.   FTT - appreciate palliative efforts, working w/ family   Acute/ subactue R frontal lobe contusion: per Nsurg no surg needed   CLL/ chronic thrombocytopenia - f/u [facility]. Last seen in June, 2022 by [facility] and plans were to stop zanubrutinib and initiate hospice care.    Recent CVA - in July 2022   Anemia ckd - Hgb above goal. 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 phos ok   HTN/ volume - under dry weight. No vol excess on exam, BP's up, on home norvasc and note hydralazine added     Lynnda Child PA-C [place] 07/09/2021,10:23 AM
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
## 2021-07-10 DIAGNOSIS — G9340 Encephalopathy, unspecified: Secondary | ICD-10-CM | POA: Diagnosis not present

## 2021-07-10 DIAGNOSIS — A630 Anogenital (venereal) warts: Secondary | ICD-10-CM | POA: Diagnosis not present

## 2021-07-10 DIAGNOSIS — I639 Cerebral infarction, unspecified: Secondary | ICD-10-CM | POA: Diagnosis not present

## 2021-07-10 DIAGNOSIS — U071 COVID-19: Secondary | ICD-10-CM | POA: Diagnosis not present

## 2021-07-10 LAB — GLUCOSE, CAPILLARY
Glucose-Capillary: 125 mg/dL — ABNORMAL HIGH (ref 70–99)
Glucose-Capillary: 119 mg/dL — ABNORMAL HIGH (ref 70–99)
Glucose-Capillary: 114 mg/dL — ABNORMAL HIGH (ref 70–99)
Glucose-Capillary: 166 mg/dL — ABNORMAL HIGH (ref 70–99)
Glucose-Capillary: 92 mg/dL (ref 70–99)

## 2021-07-10 MED ORDER — LEVETIRACETAM 500 MG PO TABS
500.0000 mg | ORAL_TABLET | Freq: Every day | ORAL | Status: DC
  Administered 2021-07-11 – 2021-07-20 (×10): 500 mg via ORAL
  Filled 2021-07-10 (×10): qty 1

## 2021-07-10 MED ORDER — CHLORHEXIDINE GLUCONATE CLOTH 2 % EX PADS
6.0000 | MEDICATED_PAD | Freq: Every day | CUTANEOUS | Status: DC
  Administered 2021-07-11 – 2021-07-16 (×4): 6 via TOPICAL

## 2021-07-10 MED ORDER — ZINC OXIDE 12.8 % EX OINT
TOPICAL_OINTMENT | Freq: Three times a day (TID) | CUTANEOUS | Status: DC
  Administered 2021-07-14: 1 via TOPICAL
  Filled 2021-07-10: qty 56.7

## 2021-07-10 MED ORDER — LEVETIRACETAM 250 MG PO TABS
250.0000 mg | ORAL_TABLET | ORAL | Status: DC
  Administered 2021-07-11 – 2021-07-20 (×5): 250 mg via ORAL
  Filled 2021-07-10 (×6): qty 1

## 2021-07-10 NOTE — Care Management Important Message (Signed)
Important Message  Patient Details  Name: Christine Gibson MRN: [id number] Date of Birth: 07/14/1951   Medicare Important Message Given:  Yes     Memory Argue 07/10/2021, 1:12 PM

## 2021-07-10 NOTE — TOC Progression Note (Signed)
Transition of Care [facility] - Progression Note    Patient Details  Name: BAUDELIA SCHROEPFER MRN: [id number] Date of Birth: 1951-06-06  Transition of Care [facility]) CM/SW [place], [place] Phone Number: 07/10/2021, 5:19 PM  Clinical Narrative:    CSW called pt's spouse to provide bed offers, VM was left.   Expected Discharge Plan: [place] Barriers to Discharge: Continued Medical Work up  Expected Discharge Plan and Services Expected Discharge Plan: [place] In-house Referral: Clinical Social Work, Hospice / [place] Acute Care Choice: Hospice Living arrangements for the past 2 months: Single Family Home                                       Social Determinants of Health (SDOH) Interventions    Readmission Risk Interventions No flowsheet data found.

## 2021-07-10 NOTE — Consult Note (Signed)
[place] Nurse Consult Note: Reason for Consult: Patient with fecal incontinence, perianal warts (viral) Wound type:irritant contact dermatitis, viral eruption  Pressure Injury POA: ICD-10 CM Codes for Irritant Dermatitis  L24A2 - Due to fecal, urinary or dual incontinence  Measurement:N/A Wound bed:few areas of partial thickness tissue loss in perineum secondary to cleansing post fecal incontinence Drainage (amount, consistency, odor) n/a Periwound: erythematous, presence of perianal warts Dressing procedure/placement/frequency:I have provided Nursing with guidance in the care of this area using a triple paste (Desitin) ointment of 40% zinc oxide as a moisture barrier. This is to be applied three times daily and PRN soiling.  Turning and repositioning is in place, heels are to be floated.  A silicone foam dressing is to be placed over the sacrum as PI prevention.  [place] nursing team will not follow, but will remain available to this patient, the nursing and medical teams.  Please re-consult if needed. Thanks, Maudie Flakes, MSN, RN, [place], Arther Abbott  Pager# [phone]
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
## 2021-07-10 NOTE — Progress Notes (Signed)
[facility] Progress Note   Subjective:   Patient seen and examined at bedside.  No hearing aids this AM so unable to answer questions.   Objective Vitals:   07/10/21 0133 07/10/21 0427 07/10/21 0641 07/10/21 0757  BP: (!) 125/58 138/62 (!) 114/58 129/62  Pulse: 87 90 70 90  Resp: 18 15  15   Temp: 97.8 F (36.6 C) 98.3 F (36.8 C)  98.3 F (36.8 C)  TempSrc: Oral Oral  Oral  SpO2: 96% 95%  93%  Weight:   36.1 kg    Physical Exam General:chronically ill appearing, elderly female in NAD Heart:RRR, no mrg Lungs:CTAB, nml WOB on RA Abdomen:soft, NTND Extremities:no LE edema Dialysis Access: LU AVG +b/t   Filed Weights   07/09/21 0509 07/09/21 2120 07/10/21 0641  Weight: 37 kg 36.4 kg 36.1 kg    Intake/Output Summary (Last 24 hours) at 07/10/2021 1135 Last data filed at 07/10/2021 0654 Gross per 24 hour  Intake 240 ml  Output 1000 ml  Net -760 ml    Additional Objective Labs: Basic Metabolic Panel: Recent Labs  Lab 07/05/21 0026 07/06/21 1137 07/08/21 0030  NA 141 142 138  K 3.7 4.5 5.2*  CL 100 102 96*  CO2 27 24 22   GLUCOSE 113* 101* 99  BUN 14 26* 16  CREATININE 4.41* 6.72* 4.89*  CALCIUM 9.2 9.6 9.8  PHOS 3.4 4.4 4.0    Medications:  levETIRAcetam Stopped (07/08/21 1530)   levETIRAcetam 500 mg (07/10/21 1016)    amLODipine  10 mg Oral Daily   carvedilol  12.5 mg Oral BID WC   Chlorhexidine Gluconate Cloth  6 each Topical Q0600   doxercalciferol  2 mcg Intravenous Q T,Th,Sa-HD   hydrALAZINE  50 mg Oral Q8H   insulin aspart  0-6 Units Subcutaneous TID WC   Zinc Oxide   Topical TID    Dialysis Orders: [facility]  3.5h  41.5kg  400/500  3K/2.5 bath    L AVG  Hep NONE (due to low plts)  - hect 2 ug tiw  - mircera 200 q2 last 8/30   Assessment/Plan: Aspiration PNA - RML s/p course of IV unasyn    AMS - multifactorial with recent CVA, contusion, seizures, COVID-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  She has a poor prognosis, waxing and  wanning mental status.  Nephrology has charted support for transition to comfort care/ hospice.  Patient husband adamantly refusing hospice due to negative experience with 1st wife per notes.    ESRD - on HD TTS schedule.  Continue HD per TTS as aligns with goals of care - please notify nephrology if goals do change. Patient DNR but would like to continue dialysis for now.    Chronic COVID infection / FTT - pt was COVID + first during May 2022 admit, has been testing + repeatedly since then. Negative testing for COVID antibodies per charting. Off isolation precautions.   FTT - appreciate palliative efforts, working w/ family   Acute/ subactue R frontal lobe contusion: per Nsurg no surg needed   CLL/ chronic thrombocytopenia - f/u [facility]. Last seen in June, 2022 by [facility] and plans were to stop zanubrutinib and initiate hospice care.    Recent CVA - in July 2022   Anemia ckd - Hgb above goal. No ESA indicated and is not ordered.   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 cont hectorol w/ hd.    HTN/ volume - Blood pressure in goal on amlodipine and hydralazine.  Does not appear volume overloaded.  UF as tolerated.  Under dry weight, will need to be lowered if discharged.   Jen Mow, PA-C [facility] 07/10/2021,11:35 AM  LOS: 14 days

## 2021-07-11 DIAGNOSIS — U071 COVID-19: Secondary | ICD-10-CM | POA: Diagnosis not present

## 2021-07-11 DIAGNOSIS — A630 Anogenital (venereal) warts: Secondary | ICD-10-CM | POA: Diagnosis not present

## 2021-07-11 DIAGNOSIS — I639 Cerebral infarction, unspecified: Secondary | ICD-10-CM | POA: Diagnosis not present

## 2021-07-11 DIAGNOSIS — G9340 Encephalopathy, unspecified: Secondary | ICD-10-CM | POA: Diagnosis not present

## 2021-07-11 LAB — RENAL FUNCTION PANEL
Albumin: 2.7 g/dL — ABNORMAL LOW (ref 3.5–5.0)
Anion gap: 16 — ABNORMAL HIGH (ref 5–15)
BUN: 24 mg/dL — ABNORMAL HIGH (ref 8–23)
CO2: 21 mmol/L — ABNORMAL LOW (ref 22–32)
Calcium: 9.5 mg/dL (ref 8.9–10.3)
Chloride: 98 mmol/L (ref 98–111)
Creatinine, Ser: 6.28 mg/dL — ABNORMAL HIGH (ref 0.44–1.00)
GFR, Estimated: 7 mL/min — ABNORMAL LOW (ref >60)
Glucose, Bld: 95 mg/dL (ref 70–99)
Phosphorus: 4.5 mg/dL (ref 2.5–4.6)
Potassium: 4.6 mmol/L (ref 3.5–5.1)
Sodium: 135 mmol/L (ref 135–145)

## 2021-07-11 LAB — CBC
HCT: 46.3 % — ABNORMAL HIGH (ref 36.0–46.0)
Hemoglobin: 14.3 g/dL (ref 12.0–15.0)
MCH: 26 pg (ref 26.0–34.0)
MCHC: 30.9 g/dL (ref 30.0–36.0)
MCV: 84.2 fL (ref 80.0–100.0)
Platelets: 74 10*3/uL — ABNORMAL LOW (ref 150–400)
RBC: 5.5 MIL/uL — ABNORMAL HIGH (ref 3.87–5.11)
RDW: 18.1 % — ABNORMAL HIGH (ref 11.5–15.5)
WBC: 85.8 10*3/uL (ref 4.0–10.5)
nRBC: 0.1 % (ref 0.0–0.2)

## 2021-07-11 LAB — GLUCOSE, CAPILLARY
Glucose-Capillary: 111 mg/dL — ABNORMAL HIGH (ref 70–99)
Glucose-Capillary: 149 mg/dL — ABNORMAL HIGH (ref 70–99)
Glucose-Capillary: 288 mg/dL — ABNORMAL HIGH (ref 70–99)

## 2021-07-11 LAB — PATHOLOGIST SMEAR REVIEW

## 2021-07-11 MED ORDER — PENTAFLUOROPROP-TETRAFLUOROETH EX AERO: 1.0000 "application " | INHALATION_SPRAY | CUTANEOUS | Status: DC | PRN

## 2021-07-11 MED ORDER — LIDOCAINE HCL (PF) 1 % IJ SOLN: 5.0000 mL | INTRAMUSCULAR | Status: DC | PRN

## 2021-07-11 MED ORDER — ALTEPLASE 2 MG IJ SOLR: 2.0000 mg | Freq: Once | INTRAMUSCULAR | Status: DC | PRN

## 2021-07-11 MED ORDER — HEPARIN SODIUM (PORCINE) 1000 UNIT/ML DIALYSIS: 1000.0000 [IU] | INTRAMUSCULAR | Status: DC | PRN

## 2021-07-11 MED ORDER — LIDOCAINE-PRILOCAINE 2.5-2.5 % EX CREA: 1.0000 "application " | TOPICAL_CREAM | CUTANEOUS | Status: DC | PRN

## 2021-07-11 MED ORDER — SODIUM CHLORIDE 0.9 % IV SOLN: 100.0000 mL | INTRAVENOUS | Status: DC | PRN

## 2021-07-11 NOTE — Progress Notes (Signed)
   07/11/21 1035  Vitals  Temp (!) 97.2 F (36.2 C)  Temp Source Oral  BP 128/69  BP Location Right Arm  BP Method Automatic  Patient Position (if appropriate) Lying  Pulse Rate 85  Pulse Rate Source Monitor  Resp 18  Oxygen Therapy  SpO2 99 %  O2 Device Room Air  Pain Assessment  Pain Scale 0-10  Pain Score 0  Dialysis Weight  Weight 35.9 kg  Type of Weight Post-Dialysis  Post-Hemodialysis Assessment  Rinseback Volume (mL) 250 mL  KECN 202 V  Dialyzer Clearance Lightly streaked  Duration of HD Treatment -hour(s) 3 hour(s)  Hemodialysis Intake (mL) 500 mL  UF Total -Machine (mL) 1095 mL  Net UF (mL) 595 mL  Tolerated HD Treatment No (Comment)  Post-Hemodialysis Comments tx complete-pt stable  AVG/AVF Arterial Site Held (minutes) 10 minutes  AVG/AVF Venous Site Held (minutes) 10 minutes  Fistula / Graft Left Upper arm  No placement date or time found.   Orientation: Left  Access Location: Upper arm  Site Condition No complications  Fistula / Graft Assessment Present;Thrill;Bruit  Status Deaccessed;Flushed  HD tx complete, pt stable. UF held for approximately 45 minutes due to SBP running in 80s. Lindsay-PA on unit and made aware. Received instructions to continue to "pull fluid" as tolerated.
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
## 2021-07-11 NOTE — Progress Notes (Signed)
[facility] Progress Note   Subjective:   Patient seen and examined at bedside during dialysis.  Seems more confused today, not really answering questions.  BP drop with HD limiting ultrafiltration.    Objective Vitals:   07/11/21 0930 07/11/21 1000 07/11/21 1029 07/11/21 1035  BP: (!) 101/57 (!) 98/54 109/66 128/69  Pulse: 91 95 94 85  Resp:    18  Temp:    (!) 97.2 F (36.2 C)  TempSrc:    Oral  SpO2:    99%  Weight:    35.9 kg  Height:       Physical Exam General:chronically ill appearing, thin, elderly female in NAD Heart:RRR, no mrg Lungs:CTAB, nml WOB on RA Abdomen:soft, NTND Extremities:no LE edema Dialysis Access: LU AVG in use  Filed Weights   07/10/21 0641 07/11/21 0713 07/11/21 1035  Weight: 36.1 kg 36.1 kg 35.9 kg    Intake/Output Summary (Last 24 hours) at 07/11/2021 1107 Last data filed at 07/11/2021 1035 Gross per 24 hour  Intake 180 ml  Output 595 ml  Net -415 ml    Additional Objective Labs: Basic Metabolic Panel: Recent Labs  Lab 07/06/21 1137 07/08/21 0030 07/11/21 0752  NA 142 138 135  K 4.5 5.2* 4.6  CL 102 96* 98  CO2 24 22 21*  GLUCOSE 101* 99 95  BUN 26* 16 24*  CREATININE 6.72* 4.89* 6.28*  CALCIUM 9.6 9.8 9.5  PHOS 4.4 4.0 4.5   Liver Function Tests: Recent Labs  Lab 07/06/21 1137 07/08/21 0030 07/11/21 0752  ALBUMIN 2.7* 2.8* 2.7*   CBC: Recent Labs  Lab 07/08/21 0402 07/08/21 0711 07/11/21 0752  WBC 46.9* 58.1* 85.8*  HGB 15.7* 15.7* 14.3  HCT 49.3* 50.8* 46.3*  MCV 84.0 87.4 84.2  PLT 55* 57* 74*   CBG: Recent Labs  Lab 07/10/21 0315 07/10/21 0755 07/10/21 1153 07/10/21 1605 07/10/21 2116  GLUCAP 125* 119* 114* 166* 92   Medications:  sodium chloride     sodium chloride      amLODipine  10 mg Oral Daily   carvedilol  12.5 mg Oral BID WC   Chlorhexidine Gluconate Cloth  6 each Topical Q0600   Chlorhexidine Gluconate Cloth  6 each Topical Q0600   doxercalciferol  2 mcg Intravenous  Q T,Th,Sa-HD   hydrALAZINE  50 mg Oral Q8H   insulin aspart  0-6 Units Subcutaneous TID WC   levETIRAcetam  500 mg Oral Daily   And   levETIRAcetam  250 mg Oral Q T,Th,Sat-1800   Zinc Oxide   Topical TID    Dialysis Orders: [facility]  3.5h  41.5kg  400/500  3K/2.5 bath    L AVG  Hep NONE (due to low plts)  - hect 2 ug tiw  - mircera 200 q2 last 8/30   Assessment/Plan: Aspiration PNA - RML s/p course of IV unasyn    AMS - multifactorial with recent CVA, contusion, seizures, COVID-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  She has a poor prognosis, waxing and wanning mental status.  Nephrology has charted support for transition to comfort care/ hospice.  Patient husband adamantly refusing hospice due to negative experience with 1st wife per notes.    ESRD - on HD TTS schedule.  Continue HD per TTS as aligns with goals of care - please notify nephrology if goals do change. Patient DNR but would like to continue dialysis for now.    Chronic COVID infection / FTT - pt was COVID + first during  May 2022 admit, has been testing + repeatedly since then. Negative testing for COVID antibodies per charting. Off isolation precautions.   FTT - appreciate palliative efforts, working w/ family   Acute/ subactue R frontal lobe contusion: per Neurosurgery - no surg needed   CLL/ chronic thrombocytopenia - f/u [place]. Last seen in June, 2022 by [facility] and plans were to stop zanubrutinib and initiate hospice care.    Recent CVA - in July 2022   Anemia ckd - Hgb above goal. No ESA indicated and is not ordered.   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 cont hectorol w/ hd.    HTN/ volume - Blood pressure in goal on amlodipine and hydralazine. Does not appear volume overloaded.  UF as tolerated.  Under dry weight, will need to be lowered if discharged.     Jen Mow, PA-C [facility] 07/11/2021,11:07 AM  LOS: 15 days

## 2021-07-12 DIAGNOSIS — G9340 Encephalopathy, unspecified: Secondary | ICD-10-CM | POA: Diagnosis not present

## 2021-07-12 LAB — GLUCOSE, CAPILLARY
Glucose-Capillary: 171 mg/dL — ABNORMAL HIGH (ref 70–99)
Glucose-Capillary: 340 mg/dL — ABNORMAL HIGH (ref 70–99)
Glucose-Capillary: 79 mg/dL (ref 70–99)
Glucose-Capillary: 247 mg/dL — ABNORMAL HIGH (ref 70–99)

## 2021-07-12 MED ORDER — CHLORHEXIDINE GLUCONATE CLOTH 2 % EX PADS
6.0000 | MEDICATED_PAD | Freq: Every day | CUTANEOUS | Status: DC
  Administered 2021-07-13 – 2021-07-20 (×8): 6 via TOPICAL

## 2021-07-12 MED ORDER — INSULIN ASPART 100 UNIT/ML IJ SOLN
5.0000 [IU] | Freq: Three times a day (TID) | INTRAMUSCULAR | Status: DC
  Administered 2021-07-15 – 2021-07-16 (×5): 5 [IU] via SUBCUTANEOUS

## 2021-07-12 NOTE — Progress Notes (Signed)
Physical Therapy Treatment Patient Details Name: Christine Gibson MRN: [id number] DOB: 1950-11-22 Today's Date: 07/12/2021   History of Present Illness Pt is a 71 y/o female admitted 9/5 secondary  to confusion and low-grade fever. MRI showed acute/subacute right frontal hemorrhage/contusion.  On 9/7 pt with witnessed seizure by nursing staff. Imaging also showing PNA and pt with metabolic encephaloptahy. Hospice has been recommended but at this time spouse wants to pursue SNF. PMH includes CLL, ESRD on HD, CVA.    PT Comments    Pt fatigued very easily and with little to no communication with therapy during session.  She followed commands inconsistently and was returning to sleep once positioned.  Pt with sore buttock - positioned in chair with pillow to offload.  Had assist of 2 due to was max-total assist last visit, however, due to pt's small size and she is assisting could likely do with +1.  Noted spouse has declined hospice and wants SNF so increased to 2 x week therapy. Pt with poor rehab potential.   Recommendations for follow up therapy are one component of a multi-disciplinary discharge planning process, led by the attending physician.  Recommendations may be updated based on patient status, additional functional criteria and insurance authorization.  Follow Up Recommendations  SNF;Supervision/Assistance - 24 hour     Equipment Recommendations  Wheelchair cushion (measurements PT);Wheelchair (measurements PT);Hospital bed    Recommendations for Other Services       Precautions / Restrictions Precautions Precautions: Fall Precaution Comments: HOH, skin breakdown sacrum     Mobility  Bed Mobility Overal bed mobility: Needs Assistance Bed Mobility: Rolling;Sidelying to Sit Rolling: Mod assist Sidelying to sit: Max assist;+2 for safety/equipment       General bed mobility comments: Rolling both sides for ADLs with assist to bend opposite knee and reach.  Sidelying to sit  with max A for legs and trunk    Transfers Overall transfer level: Needs assistance Equipment used: None Transfers: Sit to/from Stand Sit to Stand: +2 safety/equipment;Mod assist         General transfer comment: Light mod A to stand with gait belt.  Had assist of 2 for safety  Ambulation/Gait Ambulation/Gait assistance: Mod assist;+2 safety/equipment Gait Distance (Feet): 3 Feet Assistive device: 2 person hand held assist Gait Pattern/deviations: Step-to pattern;Decreased stride length;Shuffle Gait velocity: decreased   General Gait Details: light mod A x 2 to shuffle step to chair with cues for direction   Stairs             Wheelchair Mobility    Modified Rankin (Stroke Patients Only) Modified Rankin (Stroke Patients Only) Pre-Morbid Rankin Score: Moderate disability Modified Rankin: Severe disability     Balance Overall balance assessment: Needs assistance Sitting-balance support: No upper extremity supported Sitting balance-Leahy Scale: Poor Sitting balance - Comments: Requiring min guard to min A at EOB     Standing balance-Leahy Scale: Poor Standing balance comment: requiring mod A                            Cognition Arousal/Alertness: Lethargic Behavior During Therapy: Flat affect Overall Cognitive Status: Impaired/Different from baseline                   Orientation Level: Disoriented to;Place;Time;Situation Current Attention Level: Focused Memory: Decreased short-term memory Following Commands: Follows one step commands inconsistently Safety/Judgement: Decreased awareness of deficits;Decreased awareness of safety Awareness: Intellectual Problem Solving: Slow processing;Decreased initiation General Comments: Pt  not verbally responding at all to therapy today.      Exercises      General Comments General comments (skin integrity, edema, etc.): Pt with scratches on her back - observed pt scratching back.  Also with raw  buttock that was painful -positioned with pillow to offload      Pertinent Vitals/Pain Pain Assessment: No/denies pain    Home Living Family/patient expects to be discharged to:: Skilled nursing facility               Additional Comments: Patient lived at home with husband and Hospice services prior to admission.    Prior Function Level of Independence: Needs assistance          PT Goals (current goals can now be found in the care plan section) Progress towards PT goals: Progressing toward goals    Frequency    Min 2X/week      PT Plan Frequency needs to be updated    Co-evaluation              AM-PAC PT "6 Clicks" Mobility   Outcome Measure  Help needed turning from your back to your side while in a flat bed without using bedrails?: A Lot Help needed moving from lying on your back to sitting on the side of a flat bed without using bedrails?: Total Help needed moving to and from a bed to a chair (including a wheelchair)?: Total Help needed standing up from a chair using your arms (e.g., wheelchair or bedside chair)?: Total Help needed to walk in hospital room?: Total Help needed climbing 3-5 steps with a railing? : Total 6 Click Score: 7    End of Session Equipment Utilized During Treatment: Gait belt Activity Tolerance: Patient limited by fatigue Patient left: in chair;with call bell/phone within reach;with family/visitor present (alarm pad in place but no box - notified RN, family present) Nurse Communication: Mobility status PT Visit Diagnosis: Other abnormalities of gait and mobility (R26.89);Difficulty in walking, not elsewhere classified (R26.2);Muscle weakness (generalized) (M62.81)     Time: 3704-8889 PT Time Calculation (min) (ACUTE ONLY): 29 min  Charges:  $Therapeutic Activity: 23-37 mins                     Abran Richard, PT Acute Rehab Services Pager [phone] [facility] 07/12/2021, 1:27 PM

## 2021-07-12 NOTE — Progress Notes (Signed)
[facility] Progress Note   Subjective:   Patient seen and examined at bedside.  More alert today.  Denies CP, SOB, n/v/d and abdominal pain.   Objective Vitals:   07/11/21 2355 07/12/21 0355 07/12/21 0810 07/12/21 1224  BP: 122/65 (!) 136/49 (!) 147/78 128/70  Pulse:   85 81  Resp: 18 18 16 16   Temp: 98.5 F (36.9 C) 98.6 F (37 C) 98.4 F (36.9 C) 98.7 F (37.1 C)  TempSrc:  Oral Oral Oral  SpO2: 94% 95% 96% 97%  Weight:  35.5 kg    Height:       Physical Exam General:chronically ill appearing, thin, elderly female in NAD Heart:RRR, no mrg Lungs:CTAB, nml WOB on RA Abdomen:soft, NTND Extremities:No LE edema Dialysis Access: LU AVG +b/t   Filed Weights   07/11/21 0713 07/11/21 1035 07/12/21 0355  Weight: 36.1 kg 35.9 kg 35.5 kg    Intake/Output Summary (Last 24 hours) at 07/12/2021 1242 Last data filed at 07/12/2021 0901 Gross per 24 hour  Intake 240 ml  Output --  Net 240 ml    Additional Objective Labs: Basic Metabolic Panel: Recent Labs  Lab 07/06/21 1137 07/08/21 0030 07/11/21 0752  NA 142 138 135  K 4.5 5.2* 4.6  CL 102 96* 98  CO2 24 22 21*  GLUCOSE 101* 99 95  BUN 26* 16 24*  CREATININE 6.72* 4.89* 6.28*  CALCIUM 9.6 9.8 9.5  PHOS 4.4 4.0 4.5   Liver Function Tests: Recent Labs  Lab 07/06/21 1137 07/08/21 0030 07/11/21 0752  ALBUMIN 2.7* 2.8* 2.7*   CBC: Recent Labs  Lab 07/08/21 0402 07/08/21 0711 07/11/21 0752  WBC 46.9* 58.1* 85.8*  HGB 15.7* 15.7* 14.3  HCT 49.3* 50.8* 46.3*  MCV 84.0 87.4 84.2  PLT 55* 57* 74*   CBG: Recent Labs  Lab 07/11/21 1248 07/11/21 1640 07/11/21 2036 07/12/21 0808 07/12/21 1157  GLUCAP 111* 149* 288* 171* 340*    Medications:   amLODipine  10 mg Oral Daily   carvedilol  12.5 mg Oral BID WC   Chlorhexidine Gluconate Cloth  6 each Topical Q0600   Chlorhexidine Gluconate Cloth  6 each Topical Q0600   doxercalciferol  2 mcg Intravenous Q T,Th,Sa-HD   hydrALAZINE  50 mg  Oral Q8H   insulin aspart  0-6 Units Subcutaneous TID WC   levETIRAcetam  500 mg Oral Daily   And   levETIRAcetam  250 mg Oral Q T,Th,Sat-1800   Zinc Oxide   Topical TID    Dialysis Orders: [facility]  3.5h  41.5kg  400/500  3K/2.5 bath    L AVG  Hep NONE (due to low plts)  - hect 2 ug tiw  - mircera 200 q2 last 8/30   Assessment/Plan: Aspiration PNA - RML s/p course of IV unasyn    AMS - multifactorial with recent CVA, contusion, seizures, COVID-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  She has a poor prognosis, waxing and wanning mental status.  Nephrology has charted support for transition to comfort care/ hospice.  Patient husband adamantly refusing hospice due to negative experience with 1st wife per notes.    ESRD - on HD TTS schedule.  Continue HD per TTS as aligns with goals of care - please notify nephrology if goals do change. Patient DNR but would like to continue dialysis for now.    Chronic COVID infection / FTT - pt was COVID + first during May 2022 admit, has been testing + repeatedly since then. Negative  testing for COVID antibodies per charting. Off isolation precautions.   FTT - appreciate palliative efforts, working w/ family   Acute/ subactue R frontal lobe contusion: per Neurosurgery - no surg needed   CLL/ chronic thrombocytopenia - f/u [place]. Last seen in June, 2022 by [facility] and plans were to stop zanubrutinib and initiate hospice care.    Recent CVA - in July 2022   Anemia ckd - Hgb above goal. No ESA indicated and is not ordered.   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 cont hectorol w/ hd.    HTN/ volume - Blood pressure in goal on amlodipine and hydralazine. Does not appear volume overloaded.  UF as tolerated.  Under dry weight, will need to be lowered if discharged.   Jen Mow, PA-C [facility] 07/12/2021,12:42 PM  LOS: 16 days

## 2021-07-12 NOTE — Progress Notes (Signed)
Patient only tolerated sitting up in chair for about an hour and fifty minutes this shift.  Patient may benefit from a cushion for her bottom since this is what is most uncomfortable.

## 2021-07-12 NOTE — Progress Notes (Signed)
PROGRESS NOTE        PATIENT DETAILS Name: Christine Gibson Age: 70 y.o. Sex: female Date of Birth: 09/12/1951 Admit Date: 06/26/2021 Admitting Physician Etta Quill, DO NTZ:GYFVC, Alyson Locket, NP  Brief Narrative: Patient is a 70 y.o. female ESRD on HD TTS, CLL, DM-2, HTN, HLD, recent CVA in July-presented to the hospital with confusion and low-grade fever.  COVID 19 +  Significant events: 9/5>> admit-for evaluation of confusion-MRI brain with acute/subacute right frontal hemorrhage/contusion. 9/7>>  witnessed seizure by nursing staff  Significant studies: 9/5>> CXR: Right midlung infiltrate. 9/6>> MRI brain: Anterior/inferior right frontal lobe-likely contusion. 9/6>> EEG: No seizures-moderate diffuse encephalopathy 9/6>> Serum SARS-COV-2 antibody IgG: Not detected. 9/8>> EEG: Generalized periodic discharges-ictal-interictal continuum with high potential for seizures  Antimicrobial therapy: Vancomycin: 9/5 x 1 Cefepime: 9/5>> 9/7 Zithromax: 9/5>> 9/7 Unasyn: 9/8>>  Microbiology data: 5/25>> COVID screening test positive at outside facility (per prior records) 7/19>> COVID PCR: Positive (CT value 24.4) 9/05>> COVID PCR: Positive (CT value 20.4) 9/05>> blood culture: 1/2 staff epidermidis (likely a contaminant)  9/07>> blood culture: No growth   Procedures : None  Consults: Renal Neurology Neurosurgery Palliative care  DVT Prophylaxis : SCDs Start: 06/26/21 2226   Subjective:  No complaints  Assessment/Plan:  Acute metabolic encephalopathy: Mental status continues to wax and-but mostly confused-perseverating at times.  Multifactorial etiology-from recent CVA/contusion/seizures/COVID-19 infection/aspiration pneumonia.  Her prognosis is very poor-palliative care following-and engaging with family.  Unfortunately husband seems to have very poor understanding of her prognosis.   Acute/subacute right frontal lobe contusion:  Recommendations from neurosurgery are to continue supportive care     New onset seizures: Likely due to recent CVA/frontal lobe contusion seen on MRI.  Started on Keppra after discussion with neurology on 9/8.  Repeat EEG on 9/8 with abnormal waves that places the patient at high risk for seizures  Chronic lymphocytic leukemia with anemia/thrombocytopenia: She follows with oncology at [facility] last oncology note on 04/07/2021-plans were to stop zanubrutinib and initiate hospice care.  Severe leukocytosis on 07/08/21 -likely due to the CLL, monitor.  Aspiration pneumonia: Continue empiric antimicrobial therapy.  Her CT/chest x-ray imaging is more consistent with aspiration pneumonia rather than COVID-19 pneumonia.  Finished Unasyn course..   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  Review of records from [facility] that she received a dose of Evusheld in January 2022.  Unclear whether she was still taking zanubrutinib prior to this hospitalization as well.  She received a monoclonal antibody infusion on 9/6.  ESRD: On HD TTS-nephrology following for HD needs. -needs to be able to sit in chair for HD  Anemia: Likely multifactorial due to CLL and ESRD.  Transfused 1 unit of PRBC on 9/7.  Follow CBC.    Thrombocytopenia: Likely due to CLL-platelet count fluctuating-  Not sure if any further platelet transfusion is going to be beneficial given that she appears to have advanced CLL (transfused 1 unit of pheresis platelets on 9/6).  Follow CBC periodically.  CVA: Hold antiplatelets due to hemorrhage/contusion seen on MRI brain.  HTN:  -on meds, monitor  Hard of hearing  DM-2 (A1c 6.0):  -SSI   GOC: Med Rx, DNR, no  heroics.    Diet: Diet Order             DIET DYS 3 Room service appropriate? Yes with Assist; Fluid consistency: Thin  Diet effective now                    Code Status: DNR   Disposition Plan: Status is: Inpatient  Remains inpatient appropriate because:Inpatient level of care appropriate due to severity of illness  Dispo: The patient is from: Home              Anticipated d/c is to: tbd              Patient currently is not medically stable to d/c.   Difficult to place patient No    Barriers to Discharge: needs to be able to sit in chair for HD   Time spent: 35 minutes-Greater than 50% of this time was spent in counseling, explanation of diagnosis, planning of further management, and coordination of care.  MEDICATIONS:  Scheduled Meds:  amLODipine  10 mg Oral Daily   carvedilol  12.5 mg Oral BID WC   Chlorhexidine Gluconate Cloth  6 each Topical Q0600   Chlorhexidine Gluconate Cloth  6 each Topical Q0600   [START ON 07/13/2021] Chlorhexidine Gluconate Cloth  6 each Topical Q0600   doxercalciferol  2 mcg Intravenous Q T,Th,Sa-HD   hydrALAZINE  50 mg Oral Q8H   insulin aspart  0-6 Units Subcutaneous TID WC   levETIRAcetam  500 mg Oral Daily   And   levETIRAcetam  250 mg Oral Q T,Th,Sat-1800   Zinc Oxide   Topical TID   Continuous Infusions:   PRN Meds:.acetaminophen **OR** acetaminophen, hydrALAZINE, hydrocortisone-pramoxine, HYDROmorphone (DILAUDID) injection, LORazepam, ondansetron **OR** ondansetron (ZOFRAN) IV, oxyCODONE   PHYSICAL EXAM: Vital signs: Vitals:   07/11/21 2355 07/12/21 0355 07/12/21 0810 07/12/21 1224  BP: 122/65 (!) 136/49 (!) 147/78 128/70  Pulse:   85 81  Resp: 18 18 16 16   Temp: 98.5 F (36.9 C) 98.6 F (37 C) 98.4 F (36.9 C) 98.7 F (37.1 C)  TempSrc:  Oral Oral Oral  SpO2: 94% 95% 96% 97%  Weight:  35.5 kg    Height:       Filed Weights   07/11/21 0713 07/11/21 1035 07/12/21 0355  Weight: 36.1 kg 35.9 kg 35.5 kg    Body mass index is 15.81 kg/m.    Exam  General: Appearance:    Thin female in no acute distress- chronically ill appearing     Lungs:      respirations unlabored  Heart:    Normal heart rate. Normal rhythm. No murmurs, rubs, or gallops.    MS:   All extremities are intact.    Neurologic:   Awake, alert       I have personally reviewed following labs and imaging studies  LABORATORY DATA: CBC: Recent Labs  Lab 07/08/21 0402 07/08/21 0711 07/11/21 0752  WBC 46.9* 58.1* 85.8*  HGB 15.7* 15.7* 14.3  HCT 49.3* 50.8* 46.3*  MCV 84.0 87.4 84.2  PLT 55* 57* 74*    Basic Metabolic Panel: Recent Labs  Lab 07/06/21 1137 07/08/21 0030 07/11/21 0752  NA 142 138 135  K 4.5 5.2* 4.6  CL 102 96* 98  CO2 24 22 21*  GLUCOSE 101* 99 95  BUN 26* 16 24*  CREATININE 6.72* 4.89* 6.28*  CALCIUM 9.6 9.8 9.5  PHOS 4.4 4.0 4.5    GFR: Estimated Creatinine Clearance: 4.6 mL/min (A) (by C-G formula based on SCr of 6.28 mg/dL (H)).  Liver Function Tests: Recent Labs  Lab 07/06/21 1137 07/08/21 0030 07/11/21 0752  ALBUMIN 2.7* 2.8* 2.7*   No results for input(s): LIPASE, AMYLASE in the last 168 hours. No results for input(s): AMMONIA in the last 168 hours.   Coagulation Profile: No results for input(s): INR, PROTIME in the last 168 hours.   Cardiac Enzymes: No results for input(s): CKTOTAL, CKMB, CKMBINDEX, TROPONINI in the last 168 hours.  BNP (last 3 results) No results for input(s): PROBNP in the last 8760 hours.  Lipid Profile: No results for input(s): CHOL, HDL, LDLCALC, TRIG, CHOLHDL, LDLDIRECT in the last 72 hours.  Thyroid Function Tests: No results for input(s): TSH, T4TOTAL, FREET4, T3FREE, THYROIDAB in the last 72 hours.   Anemia Panel: No results for input(s): VITAMINB12, FOLATE, FERRITIN, TIBC, IRON, RETICCTPCT in the last 72 hours.   Urine analysis:    Component Value Date/Time   COLORURINE STRAW (A) 01/12/2019 1130   APPEARANCEUR CLEAR  01/12/2019 1130   LABSPEC 1.007 01/12/2019 1130   PHURINE 7.0 01/12/2019 1130   GLUCOSEU 150 (A) 01/12/2019 1130   HGBUR NEGATIVE 01/12/2019 1130   BILIRUBINUR NEGATIVE 01/12/2019 1130   KETONESUR NEGATIVE 01/12/2019 1130   PROTEINUR >=300 (A) 01/12/2019 1130   NITRITE NEGATIVE 01/12/2019 1130   LEUKOCYTESUR NEGATIVE 01/12/2019 1130    Sepsis Labs: Lactic Acid, Venous    Component Value Date/Time   LATICACIDVEN 1.1 06/26/2021 2204     RADIOLOGY STUDIES/RESULTS: No results found.   LOS: 16 days   [place] DO on 07/12/2021 at 2:22 PM   -  To page go to [email or web address]

## 2021-07-12 NOTE — TOC Progression Note (Addendum)
Transition of Care [facility]) - Progression Note    Patient Details  Name: DEIDREA GAETZ MRN: [id number] Date of Birth: Jan 12, 1951  Transition of Care [facility]) CM/SW [place], [place] Phone Number: 07/12/2021, 10:06 AM  Clinical Narrative:    CSW continuing to follow for medical readiness/being able to sit in a chair for outpatient dialysis.   CSW spoke with patient's [facility], Steva Ready [phone]). She stated that patient is eligible for a [facility]-contracted SNF. She emailed CSW the referral packet to be sent in for review. In the event patient's Medicare does not approve SNF rehab, CSW will see if patient can be placed at a [place] SNF facility, which would be outside of [place] and would need to change dialysis centers.    Expected Discharge Plan: Skilled Nursing Facility Barriers to Discharge:  (needs to sit for op HD)  Expected Discharge Plan and Services Expected Discharge Plan: [place] In-house Referral: Clinical Social Work, Hospice / [place] Acute Care Choice: Hospice Living arrangements for the past 2 months: Single Family Home                                       Social Determinants of Health (SDOH) Interventions    Readmission Risk Interventions No flowsheet data found.

## 2021-07-13 DIAGNOSIS — G9340 Encephalopathy, unspecified: Secondary | ICD-10-CM | POA: Diagnosis not present

## 2021-07-13 LAB — CBC
HCT: 44.4 % (ref 36.0–46.0)
Hemoglobin: 14.3 g/dL (ref 12.0–15.0)
MCH: 26.5 pg (ref 26.0–34.0)
MCHC: 32.2 g/dL (ref 30.0–36.0)
MCV: 82.2 fL (ref 80.0–100.0)
Platelets: 65 10*3/uL — ABNORMAL LOW (ref 150–400)
RBC: 5.4 MIL/uL — ABNORMAL HIGH (ref 3.87–5.11)
RDW: 17.6 % — ABNORMAL HIGH (ref 11.5–15.5)
WBC: 97.7 10*3/uL (ref 4.0–10.5)
nRBC: 0.1 % (ref 0.0–0.2)

## 2021-07-13 LAB — RENAL FUNCTION PANEL
Albumin: 2.7 g/dL — ABNORMAL LOW (ref 3.5–5.0)
Anion gap: 13 (ref 5–15)
BUN: 30 mg/dL — ABNORMAL HIGH (ref 8–23)
CO2: 22 mmol/L (ref 22–32)
Calcium: 9.3 mg/dL (ref 8.9–10.3)
Chloride: 96 mmol/L — ABNORMAL LOW (ref 98–111)
Creatinine, Ser: 6 mg/dL — ABNORMAL HIGH (ref 0.44–1.00)
GFR, Estimated: 7 mL/min — ABNORMAL LOW (ref >60)
Glucose, Bld: 146 mg/dL — ABNORMAL HIGH (ref 70–99)
Phosphorus: 4.3 mg/dL (ref 2.5–4.6)
Potassium: 4.8 mmol/L (ref 3.5–5.1)
Sodium: 131 mmol/L — ABNORMAL LOW (ref 135–145)

## 2021-07-13 LAB — GLUCOSE, CAPILLARY
Glucose-Capillary: 163 mg/dL — ABNORMAL HIGH (ref 70–99)
Glucose-Capillary: 150 mg/dL — ABNORMAL HIGH (ref 70–99)
Glucose-Capillary: 320 mg/dL — ABNORMAL HIGH (ref 70–99)
Glucose-Capillary: 111 mg/dL — ABNORMAL HIGH (ref 70–99)

## 2021-07-13 NOTE — Progress Notes (Addendum)
[facility] Progress Note   Subjective:   Patient seen and examined at bedside in dialysis.  Lost her hearing aids so unable to answer in questions.  Tolerating treatment well so far.   Objective Vitals:   07/13/21 0930 07/13/21 1000 07/13/21 1030 07/13/21 1100  BP: 137/84 124/68 106/70 (!) 93/57  Pulse: (!) 102 95 98 (!) 103  Resp: 16  16 16   Temp:      TempSrc:      SpO2: 94% 95% 96% 99%  Weight:      Height:       Physical Exam General:Chronically ill appearing, frail, elderly female in NAD Heart:RRR, no mrg Lungs:CTAB, nml WOB on RA Abdomen:NTND Extremities:no LE edema Dialysis Access: LU AVF in use   Filed Weights   07/11/21 1035 07/12/21 0355 07/13/21 0849  Weight: 35.9 kg 35.5 kg 35.2 kg    Intake/Output Summary (Last 24 hours) at 07/13/2021 1118 Last data filed at 07/12/2021 1828 Gross per 24 hour  Intake 360 ml  Output --  Net 360 ml    Additional Objective Labs: Basic Metabolic Panel: Recent Labs  Lab 07/08/21 0030 07/11/21 0752 07/13/21 0856  NA 138 135 131*  K 5.2* 4.6 4.8  CL 96* 98 96*  CO2 22 21* 22  GLUCOSE 99 95 146*  BUN 16 24* 30*  CREATININE 4.89* 6.28* 6.00*  CALCIUM 9.8 9.5 9.3  PHOS 4.0 4.5 4.3   Liver Function Tests: Recent Labs  Lab 07/08/21 0030 07/11/21 0752 07/13/21 0856  ALBUMIN 2.8* 2.7* 2.7*   CBC: Recent Labs  Lab 07/08/21 0402 07/08/21 0711 07/11/21 0752 07/13/21 0856  WBC 46.9* 58.1* 85.8* 97.7*  HGB 15.7* 15.7* 14.3 14.3  HCT 49.3* 50.8* 46.3* 44.4  MCV 84.0 87.4 84.2 82.2  PLT 55* 57* 74* 65*    CBG: Recent Labs  Lab 07/12/21 0808 07/12/21 1157 07/12/21 1656 07/12/21 2024 07/13/21 0741  GLUCAP 171* 340* 79 247* 163*   Medications:   amLODipine  10 mg Oral Daily   carvedilol  12.5 mg Oral BID WC   Chlorhexidine Gluconate Cloth  6 each Topical Q0600   Chlorhexidine Gluconate Cloth  6 each Topical Q0600   Chlorhexidine Gluconate Cloth  6 each Topical Q0600   doxercalciferol   2 mcg Intravenous Q T,Th,Sa-HD   hydrALAZINE  50 mg Oral Q8H   insulin aspart  0-6 Units Subcutaneous TID WC   insulin aspart  5 Units Subcutaneous TID WC   levETIRAcetam  500 mg Oral Daily   And   levETIRAcetam  250 mg Oral Q T,Th,Sat-1800   Zinc Oxide   Topical TID    Dialysis Orders: [facility]  3.5h  41.5kg  400/500  3K/2.5 bath    L AVG  Hep NONE (due to low plts)  - hect 2 ug tiw  - mircera 200 q2 last 8/30   Assessment/Plan: Aspiration PNA - RML s/p course of IV unasyn    AMS - multifactorial with recent CVA, contusion, seizures, COVID-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  She has a poor prognosis, waxing and wanning mental status.  Nephrology has charted support for transition to comfort care/ hospice.  Patient husband adamantly refusing hospice due to negative experience with 1st wife per notes.    ESRD - on HD TTS schedule.  Continue HD per TTS as aligns with goals of care - please notify nephrology if goals do change. Patient DNR but would like to continue dialysis for now.  Need to try  HD in a chair.  Plan for HD in chair on Saturday.   Chronic COVID infection / FTT - pt was COVID + first during May 2022 admit, has been testing + repeatedly since then. Negative testing for COVID antibodies per charting. Off isolation precautions.   FTT - appreciate palliative efforts, working w/ family   Acute/ subactue R frontal lobe contusion: per Neurosurgery - no surg needed   CLL/ chronic thrombocytopenia - f/u [place]. Last seen in June, 2022 by [facility] and plans were to stop zanubrutinib and initiate hospice care.    Recent CVA - in July 2022   Anemia ckd - Hgb above goal. No ESA indicated and is not ordered.   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 cont hectorol w/ hd.    HTN/ volume - Blood pressure in goal on amlodipine and hydralazine. Does not appear volume overloaded.  UF as tolerated.  Under dry weight, will need to be lowered if discharged.   Jen Mow, PA-C [facility] 07/13/2021,11:18 AM  LOS: 17 days

## 2021-07-13 NOTE — Progress Notes (Signed)
PROGRESS NOTE        PATIENT DETAILS Name: Christine Gibson Age: 70 y.o. Sex: female Date of Birth: 1951/09/03 Admit Date: 06/26/2021 Admitting Physician Etta Quill, DO BZJ:IRCVE, Alyson Locket, NP  Brief Narrative: Patient is a 70 y.o. female ESRD on HD TTS, CLL, DM-2, HTN, HLD, recent CVA in July-presented to the hospital with confusion and low-grade fever.  COVID 19 +  Poor overall prognosis  Significant events: 9/5>> admit-for evaluation of confusion-MRI brain with acute/subacute right frontal hemorrhage/contusion. 9/7>>  witnessed seizure by nursing staff  Significant studies: 9/5>> CXR: Right midlung infiltrate. 9/6>> MRI brain: Anterior/inferior right frontal lobe-likely contusion. 9/6>> EEG: No seizures-moderate diffuse encephalopathy 9/6>> Serum SARS-COV-2 antibody IgG: Not detected. 9/8>> EEG: Generalized periodic discharges-ictal-interictal continuum with high potential for seizures  Antimicrobial therapy: Vancomycin: 9/5 x 1 Cefepime: 9/5>> 9/7 Zithromax: 9/5>> 9/7 Unasyn: 9/8>>  Microbiology data: 5/25>> COVID screening test positive at outside facility (per prior records) 7/19>> COVID PCR: Positive (CT value 24.4) 9/05>> COVID PCR: Positive (CT value 20.4) 9/05>> blood culture: 1/2 staff epidermidis (likely a contaminant)  9/07>> blood culture: No growth   Procedures : None  Consults: Renal Neurology Neurosurgery Palliative care  DVT Prophylaxis : SCDs Start: 06/26/21 2226   Subjective:  Sat 2 hours in chair on 9/21  Assessment/Plan:  Acute metabolic encephalopathy: Mental status continues to wax and-but mostly confused-perseverating at times.  Multifactorial etiology-from recent CVA/contusion/seizures/COVID-19 infection/aspiration pneumonia.  Her prognosis is very poor-palliative care following-and engaging with family.  Unfortunately husband seems to have very poor understanding of her prognosis.   Acute/subacute right  frontal lobe contusion: Recommendations from neurosurgery are to continue supportive care     New onset seizures: Likely due to recent CVA/frontal lobe contusion seen on MRI.  Started on Keppra after discussion with neurology on 9/8.  Repeat EEG on 9/8 with abnormal waves that places the patient at high risk for seizures  Chronic lymphocytic leukemia with anemia/thrombocytopenia: She follows with oncology at [facility] last oncology note on 04/07/2021-plans were to stop zanubrutinib and initiate hospice care.  Severe leukocytosis on 07/08/21 -likely due to the CLL, monitor.  Aspiration pneumonia: Continue empiric antimicrobial therapy.  Her CT/chest x-ray imaging is more consistent with aspiration pneumonia rather than COVID-19 pneumonia.  Finished Unasyn course..   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  Review of records from [facility] that she received a dose of Evusheld in January 2022.  Unclear whether she was still taking zanubrutinib prior to this hospitalization as well.  She received a monoclonal antibody infusion on 9/6.  ESRD: On HD TTS-nephrology following for HD needs. -needs to be able to sit in chair for HD  Anemia: Likely multifactorial due to CLL and ESRD.  Transfused 1 unit of PRBC on 9/7.  Follow CBC.    Thrombocytopenia: Likely due to CLL-platelet count fluctuating-  Not sure if any further platelet transfusion is going to be beneficial given that she appears to have advanced CLL (transfused 1 unit of pheresis platelets on 9/6).  Follow CBC periodically.  CVA: Hold antiplatelets due to hemorrhage/contusion seen on MRI brain.  HTN:  -  on meds, monitor  Hard of hearing   DM-2 (A1c 6.0):  -SSI   GOC:  Med Rx, DNR, no heroics.    Diet: Diet Order             DIET DYS 3 Room service appropriate? Yes with Assist; Fluid consistency: Thin  Diet effective now                    Code Status: DNR   Disposition Plan: Status is: Inpatient  Remains inpatient appropriate because:Inpatient level of care appropriate due to severity of illness  Dispo: The patient is from: Home              Anticipated d/c is to:SNF              Patient currently is not medically stable to d/c.   Difficult to place patient No    Barriers to Discharge: needs to be able to sit in chair for HD (sat 2 hours on 9/21)   Time spent: 35 minutes-Greater than 50% of this time was spent in counseling, explanation of diagnosis, planning of further management, and coordination of care.  MEDICATIONS:  Scheduled Meds:  amLODipine  10 mg Oral Daily   carvedilol  12.5 mg Oral BID WC   Chlorhexidine Gluconate Cloth  6 each Topical Q0600   Chlorhexidine Gluconate Cloth  6 each Topical Q0600   Chlorhexidine Gluconate Cloth  6 each Topical Q0600   doxercalciferol  2 mcg Intravenous Q T,Th,Sa-HD   hydrALAZINE  50 mg Oral Q8H   insulin aspart  0-6 Units Subcutaneous TID WC   insulin aspart  5 Units Subcutaneous TID WC   levETIRAcetam  500 mg Oral Daily   And   levETIRAcetam  250 mg Oral Q T,Th,Sat-1800   Zinc Oxide   Topical TID   Continuous Infusions:   PRN Meds:.acetaminophen **OR** acetaminophen, hydrALAZINE, hydrocortisone-pramoxine, HYDROmorphone (DILAUDID) injection, LORazepam, ondansetron **OR** ondansetron (ZOFRAN) IV, oxyCODONE   PHYSICAL EXAM: Vital signs: Vitals:   07/13/21 1130 07/13/21 1200 07/13/21 1205 07/13/21 1311  BP: (!) 77/46 (!) 86/49 120/71 (!) 112/57  Pulse: 97 88 84 (!) 103  Resp: 16 16 18 18   Temp:  97.7 F (36.5 C)  98.1 F (36.7 C)  TempSrc:    Oral  SpO2: 99% 98% 98% 93%  Weight:   35 kg   Height:       Filed Weights   07/12/21 0355 07/13/21 0849 07/13/21 1205   Weight: 35.5 kg 35.2 kg 35 kg   Body mass index is 15.58 kg/m.    Exam  General: Appearance:    Thin female in no acute distress- chronically ill appearing     Lungs:      respirations unlabored  Heart:    Tachycardic. Normal rhythm. No murmurs, rubs, or gallops.    MS:   All extremities are intact.    Neurologic:   Awake, alert       I have personally reviewed following labs and imaging studies  LABORATORY DATA: CBC: Recent Labs  Lab 07/08/21 0402 07/08/21 0711 07/11/21 0752 07/13/21 0856  WBC 46.9* 58.1* 85.8* 97.7*  HGB 15.7* 15.7* 14.3 14.3  HCT 49.3* 50.8* 46.3* 44.4  MCV 84.0 87.4 84.2 82.2  PLT 55* 57* 74* 65*    Basic Metabolic Panel: Recent Labs  Lab 07/08/21 0030 07/11/21 0752 07/13/21 0856  NA 138 135 131*  K 5.2* 4.6 4.8  CL 96* 98 96*  CO2 22 21*  22  GLUCOSE 99 95 146*  BUN 16 24* 30*  CREATININE 4.89* 6.28* 6.00*  CALCIUM 9.8 9.5 9.3  PHOS 4.0 4.5 4.3    GFR: Estimated Creatinine Clearance: 4.8 mL/min (A) (by C-G formula based on SCr of 6 mg/dL (H)).  Liver Function Tests: Recent Labs  Lab 07/08/21 0030 07/11/21 0752 07/13/21 0856  ALBUMIN 2.8* 2.7* 2.7*   No results for input(s): LIPASE, AMYLASE in the last 168 hours. No results for input(s): AMMONIA in the last 168 hours.   Coagulation Profile: No results for input(s): INR, PROTIME in the last 168 hours.   Cardiac Enzymes: No results for input(s): CKTOTAL, CKMB, CKMBINDEX, TROPONINI in the last 168 hours.  BNP (last 3 results) No results for input(s): PROBNP in the last 8760 hours.  Lipid Profile: No results for input(s): CHOL, HDL, LDLCALC, TRIG, CHOLHDL, LDLDIRECT in the last 72 hours.  Thyroid Function Tests: No results for input(s): TSH, T4TOTAL, FREET4, T3FREE, THYROIDAB in the last 72 hours.   Anemia Panel: No results for input(s): VITAMINB12, FOLATE, FERRITIN, TIBC, IRON, RETICCTPCT in the last 72 hours.   Urine analysis:    Component Value Date/Time    COLORURINE STRAW (A) 01/12/2019 1130   APPEARANCEUR CLEAR 01/12/2019 1130   LABSPEC 1.007 01/12/2019 1130   PHURINE 7.0 01/12/2019 1130   GLUCOSEU 150 (A) 01/12/2019 1130   HGBUR NEGATIVE 01/12/2019 1130   BILIRUBINUR NEGATIVE 01/12/2019 1130   KETONESUR NEGATIVE 01/12/2019 1130   PROTEINUR >=300 (A) 01/12/2019 1130   NITRITE NEGATIVE 01/12/2019 1130   LEUKOCYTESUR NEGATIVE 01/12/2019 1130    Sepsis Labs: Lactic Acid, Venous    Component Value Date/Time   LATICACIDVEN 1.1 06/26/2021 2204     RADIOLOGY STUDIES/RESULTS: No results found.   LOS: 17 days   [place] DO on 07/13/2021 at 1:29 PM   -  To page go to [email or web address]

## 2021-07-14 DIAGNOSIS — G9340 Encephalopathy, unspecified: Secondary | ICD-10-CM | POA: Diagnosis not present

## 2021-07-14 LAB — GLUCOSE, CAPILLARY
Glucose-Capillary: 118 mg/dL — ABNORMAL HIGH (ref 70–99)
Glucose-Capillary: 161 mg/dL — ABNORMAL HIGH (ref 70–99)
Glucose-Capillary: 243 mg/dL — ABNORMAL HIGH (ref 70–99)
Glucose-Capillary: 154 mg/dL — ABNORMAL HIGH (ref 70–99)

## 2021-07-14 NOTE — Progress Notes (Signed)
PROGRESS NOTE        PATIENT DETAILS Name: Christine Gibson Age: 70 y.o. Sex: female Date of Birth: 09/11/1951 Admit Date: 06/26/2021 Admitting Physician Etta Quill, DO TJQ:[id number], Alyson Locket, NP  Brief Narrative: Patient is a 70 y.o. female ESRD on HD TTS, CLL, DM-2, HTN, HLD, recent CVA in July-presented to the hospital with confusion and low-grade fever.  COVID 19 +  Poor overall prognosis  Significant events: 9/5>> admit-for evaluation of confusion-MRI brain with acute/subacute right frontal hemorrhage/contusion. 9/7>>  witnessed seizure by nursing staff  Significant studies: 9/5>> CXR: Right midlung infiltrate. 9/6>> MRI brain: Anterior/inferior right frontal lobe-likely contusion. 9/6>> EEG: No seizures-moderate diffuse encephalopathy 9/6>> Serum SARS-COV-2 antibody IgG: Not detected. 9/8>> EEG: Generalized periodic discharges-ictal-interictal continuum with high potential for seizures  Antimicrobial therapy: Vancomycin: 9/5 x 1 Cefepime: 9/5>> 9/7 Zithromax: 9/5>> 9/7 Unasyn: 9/8>>  Microbiology data: 5/25>> COVID screening test positive at outside facility (per prior records) 7/19>> COVID PCR: Positive (CT value 24.4) 9/05>> COVID PCR: Positive (CT value 20.4) 9/05>> blood culture: 1/2 staff epidermidis (likely a contaminant)  9/07>> blood culture: No growth     Consults: Renal Neurology Neurosurgery Palliative care  DVT Prophylaxis : SCDs Start: 06/26/21 2226   Subjective:  Asking to get in chair-- will need to do next HD in chair  Assessment/Plan:  Acute metabolic encephalopathy: Mental status continues to wax and-but mostly confused-perseverating at times.  Multifactorial etiology-from recent CVA/contusion/seizures/COVID-19 infection/aspiration pneumonia.  Her prognosis is very poor-palliative care following-and engaging with family.  Unfortunately husband seems to have very poor understanding of her  prognosis.   Acute/subacute right frontal lobe contusion: Recommendations from neurosurgery are to continue supportive care  New onset seizures: Likely due to recent CVA/frontal lobe contusion seen on MRI.  Started on Keppra after discussion with neurology on 9/8.  Repeat EEG on 9/8 with abnormal waves that places the patient at high risk for seizures  Chronic lymphocytic leukemia with anemia/thrombocytopenia: She follows with oncology at [facility] last oncology note on 04/07/2021-plans were to stop zanubrutinib and initiate hospice care.  Severe leukocytosis on 07/08/21 -likely due to the CLL, monitor.  Aspiration pneumonia: Her CT/chest x-ray imaging was more consistent with aspiration pneumonia rather than COVID-19 pneumonia.  Finished Unasyn course..   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  Review of records from [facility] that she received a dose of Evusheld in January 2022.  Unclear whether she was still taking zanubrutinib prior to this hospitalization as well.  She received a monoclonal antibody infusion on 9/6.  ESRD: On HD TTS-nephrology following for HD needs. -needs to be able to sit in chair for HD  Anemia: Likely multifactorial due to CLL and ESRD.  Transfused 1 unit of PRBC on 9/7.  Follow CBC.    Thrombocytopenia: Likely due to CLL-platelet count fluctuating-  Not sure if any further platelet transfusion is going to be beneficial given that she appears to have advanced CLL (transfused 1 unit of pheresis platelets on 9/6).  Follow CBC periodically.  CVA: Hold antiplatelets due to hemorrhage/contusion seen on MRI brain.  HTN:  -on meds, monitor  Hard of hearing   DM-2 (A1c 6.0):  -SSI   GOC: Med Rx,  DNR, no heroics.    Diet: Diet Order             DIET DYS 3 Room service appropriate? Yes with Assist; Fluid consistency: Thin  Diet effective now                    Code Status: DNR   Disposition Plan: Status is: Inpatient  Remains inpatient appropriate because:Inpatient level of care appropriate due to severity of illness  Dispo: The patient is from: Home              Anticipated d/c is to:SNF              Patient currently is not medically stable to d/c.   Difficult to place patient No    Barriers to Discharge: needs to be able to sit in chair for HD (sat 2 hours on 9/21)   Time spent: 35 minutes-Greater than 50% of this time was spent in counseling, explanation of diagnosis, planning of further management, and coordination of care.  MEDICATIONS:  Scheduled Meds:  amLODipine  10 mg Oral Daily   carvedilol  12.5 mg Oral BID WC   Chlorhexidine Gluconate Cloth  6 each Topical Q0600   Chlorhexidine Gluconate Cloth  6 each Topical Q0600   Chlorhexidine Gluconate Cloth  6 each Topical Q0600   doxercalciferol  2 mcg Intravenous Q T,Th,Sa-HD   hydrALAZINE  50 mg Oral Q8H   insulin aspart  0-6 Units Subcutaneous TID WC   insulin aspart  5 Units Subcutaneous TID WC   levETIRAcetam  500 mg Oral Daily   And   levETIRAcetam  250 mg Oral Q T,Th,Sat-1800   Zinc Oxide   Topical TID   Continuous Infusions:   PRN Meds:.acetaminophen **OR** acetaminophen, hydrALAZINE, hydrocortisone-pramoxine, HYDROmorphone (DILAUDID) injection, LORazepam, ondansetron **OR** ondansetron (ZOFRAN) IV, oxyCODONE   PHYSICAL EXAM: Vital signs: Vitals:   07/13/21 2309 07/14/21 0433 07/14/21 0634 07/14/21 0744  BP: (!) 129/57 128/79  135/66  Pulse: 87 82  84  Resp: 16 18  16   Temp: 98.8 F (37.1 C) 98.7 F (37.1 C)  (!) 97.4 F (36.3 C)  TempSrc: Oral Axillary  Axillary  SpO2: 97% 98%  97%  Weight:   35.9 kg   Height:       Filed Weights   07/13/21 0849 07/13/21 1205 07/14/21  0634  Weight: 35.2 kg 35 kg 35.9 kg   Body mass index is 15.99 kg/m.    Exam  General: Appearance:    Thin female in no acute distress     Lungs:     respirations unlabored  Heart:    Normal heart rate. Normal rhythm. No murmurs, rubs, or gallops.    MS:   All extremities are intact.    Neurologic:   Awake, alert.      I have personally reviewed following labs and imaging studies  LABORATORY DATA: CBC: Recent Labs  Lab 07/08/21 0402 07/08/21 0711 07/11/21 0752 07/13/21 0856  WBC 46.9* 58.1* 85.8* 97.7*  HGB 15.7* 15.7* 14.3 14.3  HCT 49.3* 50.8* 46.3* 44.4  MCV 84.0 87.4 84.2 82.2  PLT 55* 57* 74* 65*    Basic Metabolic Panel: Recent Labs  Lab 07/08/21 0030 07/11/21 0752 07/13/21 0856  NA 138 135 131*  K 5.2* 4.6 4.8  CL 96* 98 96*  CO2 22 21* 22  GLUCOSE 99 95 146*  BUN 16 24* 30*  CREATININE 4.89* 6.28* 6.00*  CALCIUM 9.8 9.5 9.3  PHOS 4.0 4.5 4.3    GFR: Estimated Creatinine Clearance: 4.9 mL/min (A) (by C-G formula based on SCr of 6 mg/dL (H)).  Liver Function Tests: Recent Labs  Lab 07/08/21 0030 07/11/21 0752 07/13/21 0856  ALBUMIN 2.8* 2.7* 2.7*   No results for input(s): LIPASE, AMYLASE in the last 168 hours. No results for input(s): AMMONIA in the last 168 hours.   Coagulation Profile: No results for input(s): INR, PROTIME in the last 168 hours.   Cardiac Enzymes: No results for input(s): CKTOTAL, CKMB, CKMBINDEX, TROPONINI in the last 168 hours.  BNP (last 3 results) No results for input(s): PROBNP in the last 8760 hours.  Lipid Profile: No results for input(s): CHOL, HDL, LDLCALC, TRIG, CHOLHDL, LDLDIRECT in the last 72 hours.  Thyroid Function Tests: No results for input(s): TSH, T4TOTAL, FREET4, T3FREE, THYROIDAB in the last 72 hours.   Anemia Panel: No results for input(s): VITAMINB12, FOLATE, FERRITIN, TIBC, IRON, RETICCTPCT in the last 72 hours.   Urine analysis:    Component Value Date/Time   COLORURINE STRAW  (A) 01/12/2019 1130   APPEARANCEUR CLEAR 01/12/2019 1130   LABSPEC 1.007 01/12/2019 1130   PHURINE 7.0 01/12/2019 1130   GLUCOSEU 150 (A) 01/12/2019 1130   HGBUR NEGATIVE 01/12/2019 1130   BILIRUBINUR NEGATIVE 01/12/2019 1130   KETONESUR NEGATIVE 01/12/2019 1130   PROTEINUR >=300 (A) 01/12/2019 1130   NITRITE NEGATIVE 01/12/2019 1130   LEUKOCYTESUR NEGATIVE 01/12/2019 1130    Sepsis Labs: Lactic Acid, Venous    Component Value Date/Time   LATICACIDVEN 1.1 06/26/2021 2204     RADIOLOGY STUDIES/RESULTS: No results found.   LOS: 18 days   [place] DO on 07/14/2021 at 12:31 PM   -  To page go to [email or web address]

## 2021-07-14 NOTE — Progress Notes (Signed)
[facility] Progress Note   Subjective:    Patient seen and examined at bedside. Noted patient became hypotensive during yesterday's HD. Only able to remove 560ml. Plan for HD 9/24.  Objective Vitals:   07/13/21 2309 07/14/21 0433 07/14/21 0634 07/14/21 0744  BP: (!) 129/57 128/79  135/66  Pulse: 87 82  84  Resp: 16 18  16   Temp: 98.8 F (37.1 C) 98.7 F (37.1 C)  (!) 97.4 F (36.3 C)  TempSrc: Oral Axillary  Axillary  SpO2: 97% 98%  97%  Weight:   35.9 kg   Height:       Physical Exam General:Chronically ill appearing, frail, elderly female in NAD; Hard of hearing Heart:RRR, no mrg Lungs:CTAB, nml WOB on RA Abdomen:NTND Extremities:no LE edema Dialysis Access: LU AVF in use   Filed Weights   07/13/21 0849 07/13/21 1205 07/14/21 0634  Weight: 35.2 kg 35 kg 35.9 kg    Intake/Output Summary (Last 24 hours) at 07/14/2021 1130 Last data filed at 07/13/2021 1820 Gross per 24 hour  Intake 440 ml  Output 500 ml  Net -60 ml    Additional Objective Labs: Basic Metabolic Panel: Recent Labs  Lab 07/08/21 0030 07/11/21 0752 07/13/21 0856  NA 138 135 131*  K 5.2* 4.6 4.8  CL 96* 98 96*  CO2 22 21* 22  GLUCOSE 99 95 146*  BUN 16 24* 30*  CREATININE 4.89* 6.28* 6.00*  CALCIUM 9.8 9.5 9.3  PHOS 4.0 4.5 4.3   Liver Function Tests: Recent Labs  Lab 07/08/21 0030 07/11/21 0752 07/13/21 0856  ALBUMIN 2.8* 2.7* 2.7*   No results for input(s): LIPASE, AMYLASE in the last 168 hours. CBC: Recent Labs  Lab 07/08/21 0402 07/08/21 0711 07/11/21 0752 07/13/21 0856  WBC 46.9* 58.1* 85.8* 97.7*  HGB 15.7* 15.7* 14.3 14.3  HCT 49.3* 50.8* 46.3* 44.4  MCV 84.0 87.4 84.2 82.2  PLT 55* 57* 74* 65*   Blood Culture    Component Value Date/Time   SDES BLOOD RIGHT WRIST 06/28/2021 1212   SPECREQUEST  06/28/2021 1212    BOTTLES DRAWN AEROBIC AND ANAEROBIC Blood Culture adequate volume   CULT  06/28/2021 1212    NO GROWTH 5 DAYS Performed at [facility] [address] [place], [place] [address]    REPTSTATUS 07/03/2021 FINAL 06/28/2021 1212    Cardiac Enzymes: No results for input(s): CKTOTAL, CKMB, CKMBINDEX, TROPONINI in the last 168 hours. CBG: Recent Labs  Lab 07/13/21 0741 07/13/21 1317 07/13/21 1640 07/13/21 2308 07/14/21 0747  GLUCAP 163* 150* 320* 111* 118*   Iron Studies: No results for input(s): IRON, TIBC, TRANSFERRIN, FERRITIN in the last 72 hours. Lab Results  Component Value Date   INR 1.2 06/26/2021   Studies/Results: No results found.  Medications:   amLODipine  10 mg Oral Daily   carvedilol  12.5 mg Oral BID WC   Chlorhexidine Gluconate Cloth  6 each Topical Q0600   Chlorhexidine Gluconate Cloth  6 each Topical Q0600   Chlorhexidine Gluconate Cloth  6 each Topical Q0600   doxercalciferol  2 mcg Intravenous Q T,Th,Sa-HD   hydrALAZINE  50 mg Oral Q8H   insulin aspart  0-6 Units Subcutaneous TID WC   insulin aspart  5 Units Subcutaneous TID WC   levETIRAcetam  500 mg Oral Daily   And   levETIRAcetam  250 mg Oral Q T,Th,Sat-1800   Zinc Oxide   Topical TID    Dialysis Orders: TTS [place]  3.5h  41.5kg  400/500  3K/2.5 bath    L AVG  Hep NONE (due to low plts)  - hect 2 ug tiw  - mircera 200 q2 last 8/30  Assessment/Plan: Aspiration PNA - RML s/p course of IV unasyn    AMS - multifactorial with recent CVA, contusion, seizures, COVID-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  She has a poor prognosis, waxing and wanning mental status.  Nephrology has charted support for transition to comfort care/ hospice.  Patient husband adamantly refusing hospice due to negative experience with 1st wife per notes.    ESRD - on HD TTS schedule.  Continue HD per TTS as aligns with goals of care - please notify nephrology if goals do change. Patient DNR but would like to continue dialysis for now.  Need to try HD in a chair.  Plan for HD in chair on Saturday 9/24-plan to run short tx for 2hrs d/t current census on HD  unit.   Chronic COVID infection / FTT - pt was COVID + first during May 2022 admit, has been testing + repeatedly since then. Negative testing for COVID antibodies per charting. Off isolation precautions.   FTT - appreciate palliative efforts, working w/ family   Acute/ subactue R frontal lobe contusion: per Neurosurgery - no surg needed   CLL/ chronic thrombocytopenia - f/u [place]. Last [facility] in June, 20[facility] and plans were to stop zanubrutinib and initiate hospice care.    Recent CVA - in July 2022   Anemia ckd - Hgb above goal. No ESA indicated and is not ordered.   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 cont hectorol w/ hd.    HTN/ volume - Blood pressure in goal on amlodipine and hydralazine. Does not appear volume overloaded.  UF as tolerated.  Under dry weight, will need to be lowered if discharged.   Tobie Poet, NP [facility] 07/14/2021,11:30 AM  LOS: 18 days

## 2021-07-15 DIAGNOSIS — G9340 Encephalopathy, unspecified: Secondary | ICD-10-CM | POA: Diagnosis not present

## 2021-07-15 LAB — GLUCOSE, CAPILLARY
Glucose-Capillary: 144 mg/dL — ABNORMAL HIGH (ref 70–99)
Glucose-Capillary: 219 mg/dL — ABNORMAL HIGH (ref 70–99)
Glucose-Capillary: 151 mg/dL — ABNORMAL HIGH (ref 70–99)
Glucose-Capillary: 99 mg/dL (ref 70–99)

## 2021-07-15 NOTE — Progress Notes (Signed)
[facility] Progress Note   Subjective:    Seen and examined patient on HD. Informed by HD RN of SBPs in 80s-UF held. Patient hard of hearing. Denies SOB, CP, ABD pain, and N/V. Continue UF as tolerated.  Objective Vitals:   07/15/21 1010 07/15/21 1030 07/15/21 1100 07/15/21 1205  BP: (!) 88/59 (!) 93/56 (!) 101/52 (!) 167/65  Pulse: 95 88 92 99  Resp:    19  Temp:    97.9 F (36.6 C)  TempSrc:    Axillary  SpO2:    95%  Weight:      Height:       Physical Exam General:Chronically ill appearing, frail, elderly female in NAD; Hard of hearing Heart:RRR, no mrg Lungs:CTAB, nml WOB on RA Abdomen:NTND Extremities:no LE edema Dialysis Access: LU AVF in use   Filed Weights   07/14/21 0634 07/15/21 0339 07/15/21 0908  Weight: 35.9 kg 38.2 kg 38.2 kg    Intake/Output Summary (Last 24 hours) at 07/15/2021 1518 Last data filed at 07/15/2021 1433 Gross per 24 hour  Intake 600 ml  Output --  Net 600 ml    Additional Objective Labs: Basic Metabolic Panel: Recent Labs  Lab 07/11/21 0752 07/13/21 0856  NA 135 131*  K 4.6 4.8  CL 98 96*  CO2 21* 22  GLUCOSE 95 146*  BUN 24* 30*  CREATININE 6.28* 6.00*  CALCIUM 9.5 9.3  PHOS 4.5 4.3   Liver Function Tests: Recent Labs  Lab 07/11/21 0752 07/13/21 0856  ALBUMIN 2.7* 2.7*   No results for input(s): LIPASE, AMYLASE in the last 168 hours. CBC: Recent Labs  Lab 07/11/21 0752 07/13/21 0856  WBC 85.8* 97.7*  HGB 14.3 14.3  HCT 46.3* 44.4  MCV 84.2 82.2  PLT 74* 65*   Blood Culture    Component Value Date/Time   SDES BLOOD RIGHT WRIST 06/28/2021 1212   SPECREQUEST  06/28/2021 1212    BOTTLES DRAWN AEROBIC AND ANAEROBIC Blood Culture adequate volume   CULT  06/28/2021 1212    NO GROWTH 5 DAYS Performed at [facility], [place] [address], [place], [place] [address]    REPTSTATUS 07/03/2021 FINAL 06/28/2021 1212    Cardiac Enzymes: No results for input(s): CKTOTAL, CKMB, CKMBINDEX,  TROPONINI in the last 168 hours. CBG: Recent Labs  Lab 07/14/21 1225 07/14/21 1627 07/14/21 2034 07/15/21 0806 07/15/21 1209  GLUCAP 161* 243* 154* 144* 219*   Iron Studies: No results for input(s): IRON, TIBC, TRANSFERRIN, FERRITIN in the last 72 hours. Lab Results  Component Value Date   INR 1.2 06/26/2021   Studies/Results: No results found.  Medications:   amLODipine  10 mg Oral Daily   carvedilol  12.5 mg Oral BID WC   Chlorhexidine Gluconate Cloth  6 each Topical Q0600   Chlorhexidine Gluconate Cloth  6 each Topical Q0600   Chlorhexidine Gluconate Cloth  6 each Topical Q0600   doxercalciferol  2 mcg Intravenous Q T,Th,Sa-HD   hydrALAZINE  50 mg Oral Q8H   insulin aspart  0-6 Units Subcutaneous TID WC   insulin aspart  5 Units Subcutaneous TID WC   levETIRAcetam  500 mg Oral Daily   And   levETIRAcetam  250 mg Oral Q T,Th,Sat-1800   Zinc Oxide   Topical TID    Dialysis Orders: [place]  3.5h  41.5kg  400/500  3K/2.5 bath    L AVG  Hep NONE (due to low plts)  - hect 2 ug tiw  -  mircera 200 q2 last 8/30  Assessment/Plan: Aspiration PNA - RML s/p course of IV unasyn    AMS - multifactorial with recent CVA, contusion, seizures, COVID-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  She has a poor prognosis, waxing and wanning mental status.  Nephrology has charted support for transition to comfort care/ hospice.  Patient husband adamantly refusing hospice due to negative experience with 1st wife per notes.    ESRD - on HD TTS schedule.  Continue HD per TTS as aligns with goals of care - please notify nephrology if goals do change. Patient DNR but would like to continue dialysis for now.  Need to try HD in a chair.  On HD today in chair-UF held during treatment d/t episode of hypotension this has occurred previous treatment-patient denies SOB, CP, N/V-continue UF as tolerated.   Chronic COVID infection / FTT - pt was COVID + first during May 2022 admit, has been testing + repeatedly  since then. Negative testing for COVID antibodies per charting. Off isolation precautions.   FTT - appreciate palliative efforts, working w/ family   Acute/ subactue R frontal lobe contusion: per Neurosurgery - no surg needed   CLL/ chronic thrombocytopenia - f/u [place]. Last seen in June, 2022 by [facility] and plans were to stop zanubrutinib and initiate hospice care.    Recent CVA - in July 2022   Anemia ckd - Hgb above goal. No ESA indicated and is not ordered.   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 cont hectorol w/ hd.    HTN/ volume - Blood pressure in goal on amlodipine and hydralazine. Noted episodes of hypotension during HD. Does not appear volume overloaded.  UF as tolerated.  Under dry weight, will need to be lowered if discharged.    Tobie Poet, NP [facility] 07/15/2021,3:18 PM  LOS: 19 days

## 2021-07-15 NOTE — Progress Notes (Signed)
PROGRESS NOTE        PATIENT DETAILS Name: Christine Gibson Age: 70 y.o. Sex: female Date of Birth: 1951-08-28 Admit Date: 06/26/2021 Admitting Physician Etta Quill, DO [id number], Alyson Locket, NP  Brief Narrative: Patient is a 70 y.o. female ESRD on HD TTS, CLL, DM-2, HTN, HLD, recent CVA in July-presented to the hospital with confusion and low-grade fever.  COVID 19 +  Poor overall prognosis.  Has to sit in a chair for HD prior to d/c  Significant events: 9/5>> admit-for evaluation of confusion-MRI brain with acute/subacute right frontal hemorrhage/contusion. 9/7>>  witnessed seizure by nursing staff  Significant studies: 9/5>> CXR: Right midlung infiltrate. 9/6>> MRI brain: Anterior/inferior right frontal lobe-likely contusion. 9/6>> EEG: No seizures-moderate diffuse encephalopathy 9/6>> Serum SARS-COV-2 antibody IgG: Not detected. 9/8>> EEG: Generalized periodic discharges-ictal-interictal continuum with high potential for seizures  Antimicrobial therapy: Vancomycin: 9/5 x 1 Cefepime: 9/5>> 9/7 Zithromax: 9/5>> 9/7 Unasyn: 9/8>>  Microbiology data: 5/25>> COVID screening test positive at outside facility (per prior records) 7/19>> COVID PCR: Positive (CT value 24.4) 9/05>> COVID PCR: Positive (CT value 20.4) 9/05>> blood culture: 1/2 staff epidermidis (likely a contaminant)  9/07>> blood culture: No growth     Consults: Renal Neurology Neurosurgery Palliative care  DVT Prophylaxis : SCDs Start: 06/26/21 2226   Subjective: Going to HD in chair today  Assessment/Plan:  Acute metabolic encephalopathy: Mental status continues to wax and-but mostly confused-perseverating at times.  Multifactorial etiology-from recent CVA/contusion/seizures/COVID-19 infection/aspiration pneumonia.  Her prognosis is very poor-palliative care following-and engaging with family.  Unfortunately husband seems to have very poor understanding of her  prognosis.   Acute/subacute right frontal lobe contusion: Recommendations from neurosurgery are to continue supportive care  New onset seizures: Likely due to recent CVA/frontal lobe contusion seen on MRI.  Started on Keppra after discussion with neurology on 9/8.  Repeat EEG on 9/8 with abnormal waves that places the patient at high risk for seizures  Chronic lymphocytic leukemia with anemia/thrombocytopenia: She follows with oncology at [facility] last oncology note on 04/07/2021-plans were to stop zanubrutinib and initiate hospice care.  Severe leukocytosis on 07/08/21 -likely due to the CLL, monitor.  Aspiration pneumonia: Her CT/chest x-ray imaging was more consistent with aspiration pneumonia rather than COVID-19 pneumonia.  Finished Unasyn course..   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  Review of records from [facility] that she received a dose of Evusheld in January 2022.  Unclear whether she was still taking zanubrutinib prior to this hospitalization as well.  She received a monoclonal antibody infusion on 9/6.  ESRD: On HD TTS-nephrology following for HD needs. -needs to be able to sit in chair for HD  Anemia: Likely multifactorial due to CLL and ESRD.  Transfused 1 unit of PRBC on 9/7.  Follow CBC.    Thrombocytopenia: Likely due to CLL-platelet count fluctuating-  Not sure if any further platelet transfusion is going to be beneficial given that she appears to have advanced CLL (transfused 1 unit of pheresis platelets on 9/6).  Follow CBC periodically.  CVA: Hold antiplatelets due to hemorrhage/contusion seen on MRI brain.  HTN:  -  on meds, monitor  Hard of hearing   DM-2 (A1c 6.0):  -SSI    GOC: Med  Rx, DNR, no heroics.    Diet: Diet Order             DIET DYS 3 Room service appropriate? Yes with Assist; Fluid consistency: Thin  Diet effective now                    Code Status: DNR   Disposition Plan: Status is: Inpatient  Remains inpatient appropriate because:Inpatient level of care appropriate due to severity of illness  Dispo: The patient is from: Home              Anticipated d/c is to:SNF              Patient currently is not medically stable to d/c.   Difficult to place patient No    Barriers to Discharge: needs to be able to sit in chair for HD   Time spent: 35 minutes-Greater than 50% of this time was spent in counseling, explanation of diagnosis, planning of further management, and coordination of care.  MEDICATIONS:  Scheduled Meds:  amLODipine  10 mg Oral Daily   carvedilol  12.5 mg Oral BID WC   Chlorhexidine Gluconate Cloth  6 each Topical Q0600   Chlorhexidine Gluconate Cloth  6 each Topical Q0600   Chlorhexidine Gluconate Cloth  6 each Topical Q0600   doxercalciferol  2 mcg Intravenous Q T,Th,Sa-HD   hydrALAZINE  50 mg Oral Q8H   insulin aspart  0-6 Units Subcutaneous TID WC   insulin aspart  5 Units Subcutaneous TID WC   levETIRAcetam  500 mg Oral Daily   And   levETIRAcetam  250 mg Oral Q T,Th,Sat-1800   Zinc Oxide   Topical TID   Continuous Infusions:   PRN Meds:.acetaminophen **OR** acetaminophen, hydrALAZINE, hydrocortisone-pramoxine, HYDROmorphone (DILAUDID) injection, LORazepam, ondansetron **OR** ondansetron (ZOFRAN) IV, oxyCODONE   PHYSICAL EXAM: Vital signs: Vitals:   07/15/21 1000 07/15/21 1010 07/15/21 1030 07/15/21 1100  BP: (!) 113/48 (!) 88/59 (!) 93/56 (!) 101/52  Pulse: 100 95 88 92  Resp:      Temp:      TempSrc:      SpO2:      Weight:      Height:       Filed Weights   07/14/21 0634 07/15/21 0339 07/15/21 0908  Weight: 35.9 kg 38.2 kg 38.2 kg   Body mass index is 17.01 kg/m.    Exam   General:  Appearance:    Thin female in no acute distress     Lungs:     Clear to auscultation bilaterally, respirations unlabored  Heart:    Normal heart rate.   MS:   All extremities are intact.    Neurologic:   Awake, alert       I have personally reviewed following labs and imaging studies  LABORATORY DATA: CBC: Recent Labs  Lab 07/11/21 0752 07/13/21 0856  WBC 85.8* 97.7*  HGB 14.3 14.3  HCT 46.3* 44.4  MCV 84.2 82.2  PLT 74* 65*    Basic Metabolic Panel: Recent Labs  Lab 07/11/21 0752 07/13/21 0856  NA 135 131*  K 4.6 4.8  CL 98 96*  CO2 21* 22  GLUCOSE 95 146*  BUN 24* 30*  CREATININE 6.28* 6.00*  CALCIUM 9.5 9.3  PHOS 4.5 4.3    GFR: Estimated Creatinine Clearance: 5.2 mL/min (A) (by C-G formula based on  SCr of 6 mg/dL (H)).  Liver Function Tests: Recent Labs  Lab 07/11/21 0752 07/13/21 0856  ALBUMIN 2.7* 2.7*   No results for input(s): LIPASE, AMYLASE in the last 168 hours. No results for input(s): AMMONIA in the last 168 hours.   Coagulation Profile: No results for input(s): INR, PROTIME in the last 168 hours.   Cardiac Enzymes: No results for input(s): CKTOTAL, CKMB, CKMBINDEX, TROPONINI in the last 168 hours.  BNP (last 3 results) No results for input(s): PROBNP in the last 8760 hours.  Lipid Profile: No results for input(s): CHOL, HDL, LDLCALC, TRIG, CHOLHDL, LDLDIRECT in the last 72 hours.  Thyroid Function Tests: No results for input(s): TSH, T4TOTAL, FREET4, T3FREE, THYROIDAB in the last 72 hours.   Anemia Panel: No results for input(s): VITAMINB12, FOLATE, FERRITIN, TIBC, IRON, RETICCTPCT in the last 72 hours.   Urine analysis:    Component Value Date/Time   COLORURINE STRAW (A) 01/12/2019 1130   APPEARANCEUR CLEAR 01/12/2019 1130   LABSPEC 1.007 01/12/2019 1130   PHURINE 7.0 01/12/2019 1130   GLUCOSEU 150 (A) 01/12/2019 1130   HGBUR NEGATIVE 01/12/2019 1130   BILIRUBINUR NEGATIVE 01/12/2019 1130   KETONESUR NEGATIVE  01/12/2019 1130   PROTEINUR >=300 (A) 01/12/2019 1130   NITRITE NEGATIVE 01/12/2019 1130   LEUKOCYTESUR NEGATIVE 01/12/2019 1130    Sepsis Labs: Lactic Acid, Venous    Component Value Date/Time   LATICACIDVEN 1.1 06/26/2021 2204     RADIOLOGY STUDIES/RESULTS: No results found.   LOS: 19 days   [place] DO on 07/15/2021 at 12:03 PM   -  To page go to [email or web address]

## 2021-07-15 NOTE — Progress Notes (Signed)
Patient transferred off floor to dialysis, therefore I will be unable to reassess pain.

## 2021-07-15 NOTE — Plan of Care (Signed)
  Problem: Coping: Goal: Ability to identify and develop effective coping behavior will improve Outcome: Progressing   Problem: Respiratory: Goal: Verbalizations of increased ease of respirations will increase Outcome: Progressing

## 2021-07-16 DIAGNOSIS — G9340 Encephalopathy, unspecified: Secondary | ICD-10-CM | POA: Diagnosis not present

## 2021-07-16 LAB — GLUCOSE, CAPILLARY
Glucose-Capillary: 108 mg/dL — ABNORMAL HIGH (ref 70–99)
Glucose-Capillary: 85 mg/dL (ref 70–99)
Glucose-Capillary: 165 mg/dL — ABNORMAL HIGH (ref 70–99)
Glucose-Capillary: 122 mg/dL — ABNORMAL HIGH (ref 70–99)

## 2021-07-16 MED ORDER — NYSTATIN 100000 UNIT/ML MT SUSP
5.0000 mL | Freq: Four times a day (QID) | OROMUCOSAL | Status: DC
  Administered 2021-07-16 – 2021-07-20 (×17): 500000 [IU] via ORAL
  Filled 2021-07-16 (×17): qty 5

## 2021-07-16 MED ORDER — ORAL CARE MOUTH RINSE
15.0000 mL | Freq: Two times a day (BID) | OROMUCOSAL | Status: DC
  Administered 2021-07-16 – 2021-07-20 (×8): 15 mL via OROMUCOSAL

## 2021-07-16 MED ORDER — CHLORHEXIDINE GLUCONATE 0.12 % MT SOLN
15.0000 mL | Freq: Two times a day (BID) | OROMUCOSAL | Status: DC
  Administered 2021-07-16 – 2021-07-20 (×10): 15 mL via OROMUCOSAL
  Filled 2021-07-16 (×9): qty 15

## 2021-07-16 NOTE — Plan of Care (Signed)
  Problem: Health Behavior/Discharge Planning: Goal: Ability to manage health-related needs will improve Outcome: Not Progressing   

## 2021-07-16 NOTE — Progress Notes (Signed)
[facility] Progress Note   Subjective:    Patient seen and examined at bedside. Only tolerated yesterday's HD with net UF 43ml d/t episodes of hypotension. No acute changes.  Objective Vitals:   07/16/21 0000 07/16/21 0355 07/16/21 0742 07/16/21 1203  BP: (!) 144/69 (!) 137/56 (!) 121/53 (!) 137/55  Pulse:   88 87  Resp: 18 18 17 18   Temp: 98.2 F (36.8 C) 98.1 F (36.7 C) 98.9 F (37.2 C) 99.1 F (37.3 C)  TempSrc: Axillary Axillary Oral Oral  SpO2: 94% 95% 98%   Weight:  38.6 kg    Height:       Physical Exam General:Chronically ill appearing, frail, elderly female in NAD; Hard of hearing Heart:RRR, no mrg Lungs:CTAB, nml WOB on RA Abdomen:NTND Extremities:no LE edema Dialysis Access: LU AVF in use   Filed Weights   07/15/21 0908 07/15/21 1120 07/16/21 0355  Weight: 38.2 kg 37.8 kg 38.6 kg    Intake/Output Summary (Last 24 hours) at 07/16/2021 1431 Last data filed at 07/16/2021 0905 Gross per 24 hour  Intake 720 ml  Output --  Net 720 ml    Additional Objective Labs: Basic Metabolic Panel: Recent Labs  Lab 07/11/21 0752 07/13/21 0856  NA 135 131*  K 4.6 4.8  CL 98 96*  CO2 21* 22  GLUCOSE 95 146*  BUN 24* 30*  CREATININE 6.28* 6.00*  CALCIUM 9.5 9.3  PHOS 4.5 4.3   Liver Function Tests: Recent Labs  Lab 07/11/21 0752 07/13/21 0856  ALBUMIN 2.7* 2.7*   No results for input(s): LIPASE, AMYLASE in the last 168 hours. CBC: Recent Labs  Lab 07/11/21 0752 07/13/21 0856  WBC 85.8* 97.7*  HGB 14.3 14.3  HCT 46.3* 44.4  MCV 84.2 82.2  PLT 74* 65*   Blood Culture    Component Value Date/Time   SDES BLOOD RIGHT WRIST 06/28/2021 1212   SPECREQUEST  06/28/2021 1212    BOTTLES DRAWN AEROBIC AND ANAEROBIC Blood Culture adequate volume   CULT  06/28/2021 1212    NO GROWTH 5 DAYS Performed at [facility], [place] [address], [place], [place] [address]    REPTSTATUS 07/03/2021 FINAL 06/28/2021 1212    Cardiac Enzymes: No  results for input(s): CKTOTAL, CKMB, CKMBINDEX, TROPONINI in the last 168 hours. CBG: Recent Labs  Lab 07/15/21 1209 07/15/21 1727 07/15/21 2128 07/16/21 0740 07/16/21 1202  GLUCAP 219* 151* 99 108* 85   Iron Studies: No results for input(s): IRON, TIBC, TRANSFERRIN, FERRITIN in the last 72 hours. Lab Results  Component Value Date   INR 1.2 06/26/2021   Studies/Results: No results found.  Medications:   amLODipine  10 mg Oral Daily   carvedilol  12.5 mg Oral BID WC   chlorhexidine  15 mL Mouth Rinse BID   Chlorhexidine Gluconate Cloth  6 each Topical Q0600   Chlorhexidine Gluconate Cloth  6 each Topical Q0600   Chlorhexidine Gluconate Cloth  6 each Topical Q0600   doxercalciferol  2 mcg Intravenous Q T,Th,Sa-HD   hydrALAZINE  50 mg Oral Q8H   insulin aspart  0-6 Units Subcutaneous TID WC   insulin aspart  5 Units Subcutaneous TID WC   levETIRAcetam  500 mg Oral Daily   And   levETIRAcetam  250 mg Oral Q T,Th,Sat-1800   mouth rinse  15 mL Mouth Rinse q12n4p   nystatin  5 mL Oral QID   Zinc Oxide   Topical TID    Dialysis Orders: TTS [place]  3.5h  41.5kg  400/500  3K/2.5 bath    L AVG  Hep NONE (due to low plts)  - hect 2 ug tiw  - mircera 200 q2 last 8/30  Assessment/Plan: Aspiration PNA - RML s/p course of IV unasyn    AMS - multifactorial with recent CVA, contusion, seizures, COVID-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  She has a poor prognosis, waxing and wanning mental status.  Nephrology has charted support for transition to comfort care/ hospice.  Patient husband adamantly refusing hospice due to negative experience with 1st wife per notes.    ESRD - on HD TTS schedule.  Continue HD per TTS as aligns with goals of care - please notify nephrology if goals do change. Patient DNR but would like to continue dialysis for now.  Able to tolerate HD in chair last tx-will continue HD in the chair. Unable to tolerate UF d/t episodes of hypotension-continue UF as tolerated.    Chronic COVID infection / FTT - pt was COVID + first during May 2022 admit, has been testing + repeatedly since then. Negative testing for COVID antibodies per charting. Off isolation precautions.   FTT - appreciate palliative efforts, working w/ family   Acute/ subactue R frontal lobe contusion: per Neurosurgery - no surg needed   CLL/ chronic thrombocytopenia - f/u [place]. Last seen in June, 2022 by [facility] and plans were to stop zanubrutinib and initiate hospice care.    Recent CVA - in July 2022   Anemia ckd - Hgb above goal. No ESA indicated and is not ordered.   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 cont hectorol w/ hd.    HTN/ volume - Blood pressure in goal on amlodipine and hydralazine. Noted episodes of hypotension during HD. Does not appear volume overloaded.  UF as tolerated.  Under dry weight, will need to be lowered if discharged.   Christine Poet, NP [facility] 07/16/2021,2:31 PM  LOS: 20 days

## 2021-07-16 NOTE — Progress Notes (Signed)
PROGRESS NOTE        PATIENT DETAILS Name: Christine Gibson Age: 70 y.o. Sex: female Date of Birth: [birth year] Admit Date: 06/26/2021 Admitting Physician Etta Quill, DO WUJ:[id number], Alyson Locket, NP  Brief Narrative: Patient is a 70 y.o. female ESRD on HD TTS, CLL, DM-2, HTN, HLD, recent CVA in July-presented to the hospital with confusion and low-grade fever.  COVID 19 +  Poor overall prognosis.  Has to sit in a chair for HD prior to d/c  Significant events: 9/5>> admit-for evaluation of confusion-MRI brain with acute/subacute right frontal hemorrhage/contusion. 9/7>>  witnessed seizure by nursing staff  Significant studies: 9/5>> CXR: Right midlung infiltrate. 9/6>> MRI brain: Anterior/inferior right frontal lobe-likely contusion. 9/6>> EEG: No seizures-moderate diffuse encephalopathy 9/6>> Serum SARS-COV-2 antibody IgG: Not detected. 9/8>> EEG: Generalized periodic discharges-ictal-interictal continuum with high potential for seizures  Antimicrobial therapy: Vancomycin: 9/5 x 1 Cefepime: 9/5>> 9/7 Zithromax: 9/5>> 9/7 Unasyn: 9/8>>  Microbiology data: 5/25>> COVID screening test positive at outside facility (per prior records) 7/19>> COVID PCR: Positive (CT value 24.4) 9/05>> COVID PCR: Positive (CT value 20.4) 9/05>> blood culture: 1/2 staff epidermidis (likely a contaminant)  9/07>> blood culture: No growth     Consults: Renal Neurology Neurosurgery Palliative care  DVT Prophylaxis : SCDs Start: 06/26/21 2226   Subjective: Appears to have white plaques on tongue Hard of hearing  Assessment/Plan:  Acute metabolic encephalopathy: Mental status continues to wax and-but mostly confused-perseverating at times.  Multifactorial etiology-from recent CVA/contusion/seizures/COVID-19 infection/aspiration pneumonia.  Her prognosis is very poor-palliative care following-and engaging with family.  Unfortunately husband seems to have very poor  understanding of her prognosis.   Acute/subacute right frontal lobe contusion: Recommendations from neurosurgery are to continue supportive care  New onset seizures: Likely due to recent CVA/frontal lobe contusion seen on MRI.  Started on Keppra after discussion with neurology on 9/8.  Repeat EEG on 9/8 with abnormal waves that places the patient at high risk for seizures  Chronic lymphocytic leukemia with anemia/thrombocytopenia: She follows with oncology at [facility] last oncology note on 04/07/2021-plans were to stop zanubrutinib and initiate hospice care.  Severe leukocytosis on 07/08/21 -likely due to the CLL, monitor.  Aspiration pneumonia: Her CT/chest x-ray imaging was more consistent with aspiration pneumonia rather than COVID-19 pneumonia.  Finished Unasyn course..   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  Review of records from [facility] that she received a dose of Evusheld in January 2022.  Unclear whether she was still taking zanubrutinib prior to this hospitalization as well.  She received a monoclonal antibody infusion on 9/6.  ESRD: On HD TTS-nephrology following for HD needs. -sat in chair for HD  Anemia: Likely multifactorial due to CLL and ESRD.  Transfused 1 unit of PRBC on 9/7.  Follow CBC.    Thrombocytopenia: Likely due to CLL-platelet count fluctuating-  Not sure if any further platelet transfusion is going to be beneficial given that she appears to have advanced CLL (transfused 1 unit of pheresis platelets on 9/6).  Follow CBC periodically.  CVA: Hold antiplatelets due to hemorrhage/contusion seen on MRI brain.  HTN:  -on  meds, monitor  Hard of hearing   DM-2 (A1c 6.0):  -SSI    GOC: Med  Rx, DNR, no heroics.    Diet: Diet Order             DIET DYS 3 Room service appropriate? Yes with Assist; Fluid consistency: Thin  Diet effective now                    Code Status: DNR   Disposition Plan: Status is: Inpatient  Remains inpatient appropriate because:Inpatient level of care appropriate due to severity of illness  Dispo: The patient is from: Home              Anticipated d/c is to:SNF                 Time spent: 35 minutes-Greater than 50% of this time was spent in counseling, explanation of diagnosis, planning of further management, and coordination of care.  MEDICATIONS:  Scheduled Meds:  amLODipine  10 mg Oral Daily   carvedilol  12.5 mg Oral BID WC   chlorhexidine  15 mL Mouth Rinse BID   Chlorhexidine Gluconate Cloth  6 each Topical Q0600   Chlorhexidine Gluconate Cloth  6 each Topical Q0600   Chlorhexidine Gluconate Cloth  6 each Topical Q0600   doxercalciferol  2 mcg Intravenous Q T,Th,Sa-HD   hydrALAZINE  50 mg Oral Q8H   insulin aspart  0-6 Units Subcutaneous TID WC   insulin aspart  5 Units Subcutaneous TID WC   levETIRAcetam  500 mg Oral Daily   And   levETIRAcetam  250 mg Oral Q T,Th,Sat-1800   mouth rinse  15 mL Mouth Rinse q12n4p   Zinc Oxide   Topical TID   Continuous Infusions:   PRN Meds:.acetaminophen **OR** acetaminophen, hydrALAZINE, hydrocortisone-pramoxine, HYDROmorphone (DILAUDID) injection, LORazepam, ondansetron **OR** ondansetron (ZOFRAN) IV, oxyCODONE   PHYSICAL EXAM: Vital signs: Vitals:   07/16/21 0000 07/16/21 0355 07/16/21 0742 07/16/21 1203  BP: (!) 144/69 (!) 137/56 (!) 121/53 (!) 137/55  Pulse:   88 87  Resp: 18 18 17 18   Temp: 98.2 F (36.8 C) 98.1 F (36.7 C) 98.9 F (37.2 C) 99.1 F (37.3 C)  TempSrc: Axillary Axillary Oral Oral  SpO2: 94% 95% 98%   Weight:  38.6 kg    Height:       Filed Weights   07/15/21 0908 07/15/21 1120 07/16/21 0355  Weight: 38.2 kg 37.8 kg 38.6 kg   Body mass  index is 17.19 kg/m.    Exam  General: Appearance:    Thin female in no acute distress     Lungs:     respirations unlabored  Heart:    Normal heart rate.       Neurologic:   Hard of hearing White plaques in mouth         I have personally reviewed following labs and imaging studies  LABORATORY DATA: CBC: Recent Labs  Lab 07/11/21 0752 07/13/21 0856  WBC 85.8* 97.7*  HGB 14.3 14.3  HCT 46.3* 44.4  MCV 84.2 82.2  PLT 74* 65*    Basic Metabolic Panel: Recent Labs  Lab 07/11/21 0752 07/13/21 0856  NA 135 131*  K 4.6 4.8  CL 98 96*  CO2 21* 22  GLUCOSE 95 146*  BUN 24* 30*  CREATININE 6.28* 6.00*  CALCIUM 9.5 9.3  PHOS 4.5 4.3    GFR: Estimated Creatinine Clearance: 5.2 mL/min (A) (by C-G formula based on SCr of 6  mg/dL (H)).  Liver Function Tests: Recent Labs  Lab 07/11/21 0752 07/13/21 0856  ALBUMIN 2.7* 2.7*   No results for input(s): LIPASE, AMYLASE in the last 168 hours. No results for input(s): AMMONIA in the last 168 hours.   Coagulation Profile: No results for input(s): INR, PROTIME in the last 168 hours.   Cardiac Enzymes: No results for input(s): CKTOTAL, CKMB, CKMBINDEX, TROPONINI in the last 168 hours.  BNP (last 3 results) No results for input(s): PROBNP in the last 8760 hours.  Lipid Profile: No results for input(s): CHOL, HDL, LDLCALC, TRIG, CHOLHDL, LDLDIRECT in the last 72 hours.  Thyroid Function Tests: No results for input(s): TSH, T4TOTAL, FREET4, T3FREE, THYROIDAB in the last 72 hours.   Anemia Panel: No results for input(s): VITAMINB12, FOLATE, FERRITIN, TIBC, IRON, RETICCTPCT in the last 72 hours.   Urine analysis:    Component Value Date/Time   COLORURINE STRAW (A) 01/12/2019 1130   APPEARANCEUR CLEAR 01/12/2019 1130   LABSPEC 1.007 01/12/2019 1130   PHURINE 7.0 01/12/2019 1130   GLUCOSEU 150 (A) 01/12/2019 1130   HGBUR NEGATIVE 01/12/2019 1130   BILIRUBINUR NEGATIVE 01/12/2019 1130   KETONESUR NEGATIVE  01/12/2019 1130   PROTEINUR >=300 (A) 01/12/2019 1130   NITRITE NEGATIVE 01/12/2019 1130   LEUKOCYTESUR NEGATIVE 01/12/2019 1130    Sepsis Labs: Lactic Acid, Venous    Component Value Date/Time   LATICACIDVEN 1.1 06/26/2021 2204     RADIOLOGY STUDIES/RESULTS: No results found.   LOS: 20 days   [place] DO on 07/16/2021 at 12:07 PM   -  To page go to [email or web address]

## 2021-07-17 ENCOUNTER — Other Ambulatory Visit: Payer: Self-pay

## 2021-07-17 ENCOUNTER — Encounter (HOSPITAL_COMMUNITY): Payer: Self-pay | Admitting: Internal Medicine

## 2021-07-17 DIAGNOSIS — E119 Type 2 diabetes mellitus without complications: Secondary | ICD-10-CM | POA: Diagnosis not present

## 2021-07-17 DIAGNOSIS — C9110 Chronic lymphocytic leukemia of B-cell type not having achieved remission: Secondary | ICD-10-CM | POA: Diagnosis not present

## 2021-07-17 DIAGNOSIS — Z7189 Other specified counseling: Secondary | ICD-10-CM | POA: Diagnosis not present

## 2021-07-17 DIAGNOSIS — G9340 Encephalopathy, unspecified: Secondary | ICD-10-CM | POA: Diagnosis not present

## 2021-07-17 LAB — GLUCOSE, CAPILLARY
Glucose-Capillary: 97 mg/dL (ref 70–99)
Glucose-Capillary: 122 mg/dL — ABNORMAL HIGH (ref 70–99)
Glucose-Capillary: 188 mg/dL — ABNORMAL HIGH (ref 70–99)
Glucose-Capillary: 186 mg/dL — ABNORMAL HIGH (ref 70–99)

## 2021-07-17 NOTE — Progress Notes (Signed)
PROGRESS NOTE        PATIENT DETAILS Name: Christine Gibson Age: 70 y.o. Sex: female Date of Birth: Sep 30, 1951 Admit Date: 06/26/2021 Admitting Physician Etta Quill, DO ZDG:UYQIH, Alyson Locket, NP  Brief Narrative: Patient is a 70 y.o. female ESRD on HD TTS, CLL, DM-2, HTN, HLD, recent CVA in July-presented to the hospital with confusion and low-grade fever.  COVID 19 +  Poor overall prognosis.  Had to sit in a chair for HD prior to d/c   Significant events: 9/5>> admit-for evaluation of confusion-MRI brain with acute/subacute right frontal hemorrhage/contusion. 9/7>>  witnessed seizure by nursing staff  Significant studies: 9/5>> CXR: Right midlung infiltrate. 9/6>> MRI brain: Anterior/inferior right frontal lobe-likely contusion. 9/6>> EEG: No seizures-moderate diffuse encephalopathy 9/6>> Serum SARS-COV-2 antibody IgG: Not detected. 9/8>> EEG: Generalized periodic discharges-ictal-interictal continuum with high potential for seizures  Antimicrobial therapy: Vancomycin: 9/5 x 1 Cefepime: 9/5>> 9/7 Zithromax: 9/5>> 9/7 Unasyn: 9/8>>  Microbiology data: 5/25>> COVID screening test positive at outside facility (per prior records) 7/19>> COVID PCR: Positive (CT value 24.4) 9/05>> COVID PCR: Positive (CT value 20.4) 9/05>> blood culture: 1/2 staff epidermidis (likely a contaminant)  9/07>> blood culture: No growth     Consults: Renal Neurology Neurosurgery Palliative care  DVT Prophylaxis : SCDs Start: 06/26/21 2226   Subjective: Says she would prefer to go home and not rehab but understands her husband can not care for her  Assessment/Plan:  Acute metabolic encephalopathy: Mental status continues to wax and-but mostly confused-perseverating at times.  Multifactorial etiology-from recent CVA/contusion/seizures/COVID-19 infection/aspiration pneumonia.  Her prognosis is very poor-palliative care following-and engaging with family.   Unfortunately husband seems to have very poor understanding of her prognosis.   Acute/subacute right frontal lobe contusion: Recommendations from neurosurgery are to continue supportive care  New onset seizures: Likely due to recent CVA/frontal lobe contusion seen on MRI.  Started on Keppra after discussion with neurology on 9/8.  Repeat EEG on 9/8 with abnormal waves that places the patient at high risk for seizures  Chronic lymphocytic leukemia with anemia/thrombocytopenia: She follows with oncology at [facility] last oncology note on 04/07/2021-plans were to stop zanubrutinib and initiate hospice care.  Severe leukocytosis on 07/08/21 -likely due to the CLL, monitor.  Aspiration pneumonia: Her CT/chest x-ray imaging was more consistent with aspiration pneumonia rather than COVID-19 pneumonia.  Finished Unasyn course..   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  Review of records from [facility] that she received a dose of Evusheld in January 2022.  Unclear whether she was still taking zanubrutinib prior to this hospitalization as well.  She received a monoclonal antibody infusion on 9/6.  ESRD: On HD TTS-nephrology following for HD needs. -sat in chair for HD  Anemia: Likely multifactorial due to CLL and ESRD.  Transfused 1 unit of PRBC on 9/7.  Thrombocytopenia: Likely due to CLL-platelet count fluctuating-  Not sure if any further platelet transfusion is going to be beneficial given that she appears to have advanced CLL (transfused 1 unit of pheresis platelets on 9/6).  Follow CBC periodically.  CVA: Hold antiplatelets due to hemorrhage/contusion seen on MRI  brain.  HTN:  -on meds, monitor  Hard of hearing   DM-2 (A1c  6.0):  -SSI    GOC: Med Rx, DNR, no heroics.    Diet: Diet Order             DIET DYS 3 Room service appropriate? Yes with Assist; Fluid consistency: Thin  Diet effective now                    Code Status: DNR   Disposition Plan: Status is: Inpatient  Remains inpatient appropriate because:Inpatient level of care appropriate due to severity of illness  Dispo: The patient is from: Home              Anticipated d/c is to:SNF- await insurance authorization                 Time spent: 35 minutes-Greater than 50% of this time was spent in counseling, explanation of diagnosis, planning of further management, and coordination of care.  MEDICATIONS:  Scheduled Meds:  amLODipine  10 mg Oral Daily   carvedilol  12.5 mg Oral BID WC   chlorhexidine  15 mL Mouth Rinse BID   Chlorhexidine Gluconate Cloth  6 each Topical Q0600   doxercalciferol  2 mcg Intravenous Q T,Th,Sa-HD   hydrALAZINE  50 mg Oral Q8H   insulin aspart  0-6 Units Subcutaneous TID WC   levETIRAcetam  500 mg Oral Daily   And   levETIRAcetam  250 mg Oral Q T,Th,Sat-1800   mouth rinse  15 mL Mouth Rinse q12n4p   nystatin  5 mL Oral QID   Zinc Oxide   Topical TID   Continuous Infusions:   PRN Meds:.acetaminophen **OR** acetaminophen, hydrALAZINE, hydrocortisone-pramoxine, HYDROmorphone (DILAUDID) injection, LORazepam, ondansetron **OR** ondansetron (ZOFRAN) IV, oxyCODONE   PHYSICAL EXAM: Vital signs: Vitals:   07/17/21 0000 07/17/21 0348 07/17/21 0349 07/17/21 0806  BP: (!) 124/53 124/63  (!) 136/59  Pulse: 75   89  Resp: 16   18  Temp: 98.9 F (37.2 C) 97.6 F (36.4 C)  98.2 F (36.8 C)  TempSrc: Axillary Axillary  Oral  SpO2: 97%   95%  Weight:   38.4 kg   Height:       Filed Weights   07/15/21 1120 07/16/21 0355 07/17/21 0349  Weight: 37.8 kg 38.6 kg 38.4 kg   Body mass index is 17.1 kg/m.    Exam  General: Appearance:    Thin female in no acute distress     Lungs:      respirations unlabored  Heart:    Normal heart rate.       Neurologic:   Hard of hearing White plaques in mouth         I have personally reviewed following labs and imaging studies  LABORATORY DATA: CBC: Recent Labs  Lab 07/11/21 0752 07/13/21 0856  WBC 85.8* 97.7*  HGB 14.3 14.3  HCT 46.3* 44.4  MCV 84.2 82.2  PLT 74* 65*    Basic Metabolic Panel: Recent Labs  Lab 07/11/21 0752 07/13/21 0856  NA 135 131*  K 4.6 4.8  CL 98 96*  CO2 21* 22  GLUCOSE 95 146*  BUN 24* 30*  CREATININE 6.28* 6.00*  CALCIUM 9.5 9.3  PHOS 4.5 4.3    GFR: Estimated Creatinine Clearance: 5.2 mL/min (A) (by C-G formula based on SCr of 6 mg/dL (H)).  Liver Function Tests: Recent Labs  Lab 07/11/21 0752 07/13/21 0856  ALBUMIN 2.7* 2.7*  No results for input(s): LIPASE, AMYLASE in the last 168 hours. No results for input(s): AMMONIA in the last 168 hours.   Coagulation Profile: No results for input(s): INR, PROTIME in the last 168 hours.   Cardiac Enzymes: No results for input(s): CKTOTAL, CKMB, CKMBINDEX, TROPONINI in the last 168 hours.  BNP (last 3 results) No results for input(s): PROBNP in the last 8760 hours.  Lipid Profile: No results for input(s): CHOL, HDL, LDLCALC, TRIG, CHOLHDL, LDLDIRECT in the last 72 hours.  Thyroid Function Tests: No results for input(s): TSH, T4TOTAL, FREET4, T3FREE, THYROIDAB in the last 72 hours.   Anemia Panel: No results for input(s): VITAMINB12, FOLATE, FERRITIN, TIBC, IRON, RETICCTPCT in the last 72 hours.   Urine analysis:    Component Value Date/Time   COLORURINE STRAW (A) 01/12/2019 1130   APPEARANCEUR CLEAR 01/12/2019 1130   LABSPEC 1.007 01/12/2019 1130   PHURINE 7.0 01/12/2019 1130   GLUCOSEU 150 (A) 01/12/2019 1130   HGBUR NEGATIVE 01/12/2019 1130   BILIRUBINUR NEGATIVE 01/12/2019 1130   KETONESUR NEGATIVE 01/12/2019 1130   PROTEINUR >=300 (A) 01/12/2019 1130   NITRITE NEGATIVE 01/12/2019 1130   LEUKOCYTESUR  NEGATIVE 01/12/2019 1130    Sepsis Labs: Lactic Acid, Venous    Component Value Date/Time   LATICACIDVEN 1.1 06/26/2021 2204     RADIOLOGY STUDIES/RESULTS: No results found.   LOS: 21 days   [place] DO on 07/17/2021 at 12:15 PM   -  To page go to [email or web address]

## 2021-07-17 NOTE — TOC Progression Note (Addendum)
Transition of Care [facility]) - Progression Note    Patient Details  Name: Christine Gibson MRN: [id number] Date of Birth: Aug 18, 1951  [facility]) CM/SW [employer], [place] Phone Number: 07/17/2021, 1:57 PM  Clinical Narrative:    1:15pm-CSW attempted to contact patient's spouse to discuss SNF bed offers with two different phones but will not connect. CSW will continue efforts to contact him. Insurance will also need an updated PT note.   3:15pm-Patient's spouse at patient's bedside. CSW provided him with SNF bed offers. He would like to [place] and will head there now and let CSW know his decision.    Expected Discharge Plan: Skilled Nursing Facility Barriers to Discharge:  (needs to sit for op HD)  Expected Discharge Plan and Services Expected Discharge Plan: [place] In-house Referral: Clinical Social Work, Hospice / [place] Acute Care Choice: Hospice Living arrangements for the past 2 months: Single Family Home                                       Social Determinants of Health (SDOH) Interventions    Readmission Risk Interventions No flowsheet data found.

## 2021-07-17 NOTE — Progress Notes (Signed)
  [facility] Progress Note   Subjective:  Seen in room - frail, but no specific complaints today. Denies CP or dyspnea.  Objective Vitals:   07/17/21 0000 07/17/21 0348 07/17/21 0349 07/17/21 0806  BP: (!) 124/53 124/63  (!) 136/59  Pulse: 75   89  Resp: 16   18  Temp: 98.9 F (37.2 C) 97.6 F (36.4 C)  98.2 F (36.8 C)  TempSrc: Axillary Axillary  Oral  SpO2: 97%   95%  Weight:   38.4 kg   Height:       Physical Exam General: Chronically ill and frail appearing woman, NAD Heart: RRR; 2/6 murmur Lungs: CTA anteriorly Abdomen: soft Extremities: No LE edema Dialysis Access: LUE AVF + thrill  Additional Objective Labs: Basic Metabolic Panel: Recent Labs  Lab 07/11/21 0752 07/13/21 0856  NA 135 131*  K 4.6 4.8  CL 98 96*  CO2 21* 22  GLUCOSE 95 146*  BUN 24* 30*  CREATININE 6.28* 6.00*  CALCIUM 9.5 9.3  PHOS 4.5 4.3   Liver Function Tests: Recent Labs  Lab 07/11/21 0752 07/13/21 0856  ALBUMIN 2.7* 2.7*   CBC: Recent Labs  Lab 07/11/21 0752 07/13/21 0856  WBC 85.8* 97.7*  HGB 14.3 14.3  HCT 46.3* 44.4  MCV 84.2 82.2  PLT 74* 65*   Medications:   amLODipine  10 mg Oral Daily   carvedilol  12.5 mg Oral BID WC   chlorhexidine  15 mL Mouth Rinse BID   Chlorhexidine Gluconate Cloth  6 each Topical Q0600   doxercalciferol  2 mcg Intravenous Q T,Th,Sa-HD   hydrALAZINE  50 mg Oral Q8H   insulin aspart  0-6 Units Subcutaneous TID WC   levETIRAcetam  500 mg Oral Daily   And   levETIRAcetam  250 mg Oral Q T,Th,Sat-1800   mouth rinse  15 mL Mouth Rinse q12n4p   nystatin  5 mL Oral QID   Zinc Oxide   Topical TID   Dialysis Orders: [facility] 3.5h  41.5kg  400/500  3K/2.5 bath L AVG  no heparin  - Hectoral 32mcg IV q HD  - Mircera 200 q2 last 8/30  Assessment/Plan: Aspiration PNA - RML s/p course of IV unasyn  AMS - multifactorial with recent CVA, contusion, seizures, COVID-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  She has a poor prognosis,  waxing and wanning mental status.  Nephrology has charted support for transition to comfort care/ hospice.  Patient husband adamantly refusing hospice due to negative experience with 1st wife per notes.  ESRD: Continue HD per TTS schedule - next tomorrow. BP dropping q HD - follow. Chronic COVID infection/FTT: pt was COVID + first during May 2022 admit, has been testing + repeatedly since then. Negative testing for COVID antibodies per charting. Off isolation precautions. FTT - appreciate palliative efforts, working w/ family Acute/ subactue R frontal lobe contusion: per Neurosurgery - no surg needed CLL/ chronic thrombocytopenia - f/u [place]. Last seen in June, 2022 by [facility] and plans were to stop zanubrutinib and initiate hospice care.  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 No ESA indicated and is not ordered. Secondary HPTH: Ca/Phos ok, continue VDRA - no binder for now. HTN/ volume: BP stable, but then drops with HD. No signs of overload. Remains on Coreg and amlodipine - may need to reduce dose.  Veneta Penton, PA-C 07/17/2021, 10:19 AM  [employer]

## 2021-07-18 DIAGNOSIS — G9340 Encephalopathy, unspecified: Secondary | ICD-10-CM | POA: Diagnosis not present

## 2021-07-18 LAB — RENAL FUNCTION PANEL
Albumin: 2.3 g/dL — ABNORMAL LOW (ref 3.5–5.0)
Anion gap: 14 (ref 5–15)
BUN: 64 mg/dL — ABNORMAL HIGH (ref 8–23)
CO2: 20 mmol/L — ABNORMAL LOW (ref 22–32)
Calcium: 8.4 mg/dL — ABNORMAL LOW (ref 8.9–10.3)
Chloride: 92 mmol/L — ABNORMAL LOW (ref 98–111)
Creatinine, Ser: 7.49 mg/dL — ABNORMAL HIGH (ref 0.44–1.00)
GFR, Estimated: 5 mL/min — ABNORMAL LOW (ref >60)
Glucose, Bld: 110 mg/dL — ABNORMAL HIGH (ref 70–99)
Phosphorus: 6.8 mg/dL — ABNORMAL HIGH (ref 2.5–4.6)
Potassium: 5.7 mmol/L — ABNORMAL HIGH (ref 3.5–5.1)
Sodium: 126 mmol/L — ABNORMAL LOW (ref 135–145)

## 2021-07-18 LAB — CBC
HCT: 34.9 % — ABNORMAL LOW (ref 36.0–46.0)
Hemoglobin: 11.3 g/dL — ABNORMAL LOW (ref 12.0–15.0)
MCH: 26.5 pg (ref 26.0–34.0)
MCHC: 32.4 g/dL (ref 30.0–36.0)
MCV: 81.7 fL (ref 80.0–100.0)
Platelets: 61 10*3/uL — ABNORMAL LOW (ref 150–400)
RBC: 4.27 MIL/uL (ref 3.87–5.11)
RDW: 17.2 % — ABNORMAL HIGH (ref 11.5–15.5)
WBC: 99.5 10*3/uL (ref 4.0–10.5)
nRBC: 0 % (ref 0.0–0.2)

## 2021-07-18 LAB — GLUCOSE, CAPILLARY
Glucose-Capillary: 115 mg/dL — ABNORMAL HIGH (ref 70–99)
Glucose-Capillary: 204 mg/dL — ABNORMAL HIGH (ref 70–99)
Glucose-Capillary: 216 mg/dL — ABNORMAL HIGH (ref 70–99)

## 2021-07-18 NOTE — Progress Notes (Signed)
Physical Therapy Treatment Patient Details Name: Christine Gibson MRN: [id number] DOB: 01-20-1951 Today's Date: 07/18/2021   History of Present Illness Pt is a 70 y/o female admitted 9/5 secondary  to confusion and low-grade fever. MRI showed acute/subacute right frontal hemorrhage/contusion.  On 9/7 pt with witnessed seizure by nursing staff. Imaging also showing PNA and pt with metabolic encephaloptahy. Hospice has been recommended but at this time spouse wants to pursue SNF. PMH includes CLL, ESRD on HD, CVA.    PT Comments    Pt sleeping upon arrival, wakes with PT speaking pt name. Pt engaged during session and following all one-step commands consistently. Pt overall requiring mod-max +1 assist for moving to/from EOB, standing, and steps towards HOB. Pt tolerated x10 minutes EOB sitting with LE exercise and balance tasks (hands in lap, reaching for husband's hand, etc). SNF remains appropriate.     Recommendations for follow up therapy are one component of a multi-disciplinary discharge planning process, led by the attending physician.  Recommendations may be updated based on patient status, additional functional criteria and insurance authorization.  Follow Up Recommendations  SNF;Supervision/Assistance - 24 hour     Equipment Recommendations  Wheelchair cushion (measurements PT);Wheelchair (measurements PT);Hospital bed    Recommendations for Other Services       Precautions / Restrictions Precautions Precautions: Fall Precaution Comments: HOH, skin breakdown sacrum Restrictions Weight Bearing Restrictions: No     Mobility  Bed Mobility Overal bed mobility: Needs Assistance Bed Mobility: Rolling;Sidelying to Sit;Sit to Supine Rolling: Min assist Sidelying to sit: Mod assist;HOB elevated   Sit to supine: Max assist;HOB elevated   General bed mobility comments: min-mod assist for moving to EOB for trunk and LE management, scooting to EOB. Max assist for return to supine  for LE and trunk back into bed, boost up in bed.    Transfers Overall transfer level: Needs assistance Equipment used: 1 person hand held assist Transfers: Sit to/from Stand Sit to Stand: Mod assist         General transfer comment: mod assist for power up, rise, and steady. PT facilitating through posterior pelvis. Pt able to take x1 step toward [facility] with max facilitation and weight shifting from PT.  Ambulation/Gait                 Stairs             Wheelchair Mobility    Modified Rankin (Stroke Patients Only) Modified Rankin (Stroke Patients Only) Pre-Morbid Rankin Score: Moderate disability Modified Rankin: Severe disability     Balance Overall balance assessment: Needs assistance Sitting-balance support: No upper extremity supported Sitting balance-Leahy Scale: Fair Sitting balance - Comments: able to sit EOB with supervision level of assist statically; requires truncal assist for dynamic activity.     Standing balance-Leahy Scale: Poor Standing balance comment: requiring mod A                            Cognition Arousal/Alertness: Lethargic Behavior During Therapy: Flat affect Overall Cognitive Status: Impaired/Different from baseline Area of Impairment: Problem solving;Awareness;Safety/judgement;Attention;Following commands                   Current Attention Level: Sustained   Following Commands: Follows one step commands with increased time Safety/Judgement: Decreased awareness of deficits;Decreased awareness of safety Awareness: Intellectual Problem Solving: Slow processing;Decreased initiation;Difficulty sequencing;Requires tactile cues;Requires verbal cues General Comments: Pt consistently following one-step commands with increased time and at  times multimodal cuing. Pt responding mostly Solicitor Exercises - Lower Extremity Long Arc Quad: AAROM;Both;10 reps;Seated    General Comments         Pertinent Vitals/Pain Pain Assessment: Faces Faces Pain Scale: Hurts even more Pain Location: back Pain Descriptors / Indicators: Moaning;Grimacing Pain Intervention(s): Limited activity within patient's tolerance;Monitored during session;Repositioned    Home Living                      Prior Function            PT Goals (current goals can now be found in the care plan section) Acute Rehab PT Goals Patient Stated Goal: none stated PT Goal Formulation: With patient Time For Goal Achievement: 07/22/21 Potential to Achieve Goals: Fair Progress towards PT goals: Progressing toward goals    Frequency    Min 2X/week      PT Plan Current plan remains appropriate    Co-evaluation              AM-PAC PT "6 Clicks" Mobility   Outcome Measure  Help needed turning from your back to your side while in a flat bed without using bedrails?: A Lot Help needed moving from lying on your back to sitting on the side of a flat bed without using bedrails?: A Lot Help needed moving to and from a bed to a chair (including a wheelchair)?: A Lot Help needed standing up from a chair using your arms (e.g., wheelchair or bedside chair)?: A Lot Help needed to walk in hospital room?: Total Help needed climbing 3-5 steps with a railing? : Total 6 Click Score: 10    End of Session   Activity Tolerance: Patient tolerated treatment well Patient left: in bed;with call bell/phone within reach;with bed alarm set;with family/visitor present Nurse Communication: Mobility status PT Visit Diagnosis: Other abnormalities of gait and mobility (R26.89);Difficulty in walking, not elsewhere classified (R26.2);Muscle weakness (generalized) (M62.81)     Time: 7416-3845 PT Time Calculation (min) (ACUTE ONLY): 22 min  Charges:  $Therapeutic Activity: 8-22 mins                     Stacie Glaze, PT DPT Acute Rehabilitation Services Pager [phone]  Office [phone]    Roxine Caddy E  Ruffin Pyo 07/18/2021, 5:27 PM

## 2021-07-18 NOTE — Progress Notes (Signed)
Pt returned from dialysis @ noon & had reportedly received Dilaudid for pain. Pt was placed on O2 @ 2L/min via [place] due to O2 sat being 88% on RA. Pt remains drowsy & O2 sat is 95$ on RA,

## 2021-07-18 NOTE — TOC Progression Note (Signed)
Transition of Care [facility]) - Progression Note    Patient Details  Name: Christine Gibson MRN: [id number] Date of Birth: July 09, 1951  Transition of Care [facility]) CM/SW [place], LCSW Phone Number: 07/18/2021, 3:44 PM  Clinical Narrative:    [place] starting insurance authorization. In the meantime, CSW contacted [facility] to see if they have any [facility] available.    Expected Discharge Plan: Skilled Nursing Facility Barriers to Discharge:  (needs to sit for op HD)  Expected Discharge Plan and Services Expected Discharge Plan: [place] In-house Referral: Clinical Social Work, Hospice / [place] Acute Care Choice: Hospice Living arrangements for the past 2 months: Single Family Home                                       Social Determinants of Health (SDOH) Interventions    Readmission Risk Interventions No flowsheet data found.

## 2021-07-18 NOTE — Procedures (Signed)
Patient seen on Hemodialysis- resting comfortably. BP 124/65   Pulse 92   Temp 98.9 F (37.2 C)   Resp 16   Ht 4\' 11"  (1.499 m)   Wt 38.2 kg   LMP  (LMP Unknown)   SpO2 92%   BMI 17.01 kg/m   QB 400, UF goal keeping even Tolerating treatment without complaints at this time.  Elmarie Shiley MD [facility]. Office # [phone] Pager # [phone] 9:24 AM

## 2021-07-18 NOTE — Progress Notes (Signed)
PROGRESS NOTE        PATIENT DETAILS Name: Christine Gibson Age: 70 y.o. Sex: female Date of Birth: Jun 20, 1951 Admit Date: 06/26/2021 Admitting Physician Etta Quill, DO [id number], Alyson Locket, NP  Brief Narrative: Patient is a 70 y.o. female ESRD on HD TTS, CLL, DM-2, HTN, HLD, recent CVA in July-presented to the hospital with confusion and low-grade fever.  COVID 19 +  Poor overall prognosis but husband resistant to hospice.  Was able to sit in chair for HD so insurance authorization is pending.     Significant events: 9/5>> admit-for evaluation of confusion-MRI brain with acute/subacute right frontal hemorrhage/contusion. 9/7>>  witnessed seizure by nursing staff   Consults: Renal Neurology Neurosurgery Palliative care  DVT Prophylaxis : SCDs Start: 06/26/21 2226   Subjective: Nursing reports pain during HD so got dose of Dilaudid   Assessment/Plan:  Acute metabolic encephalopathy: Mental status continues to wax and-but mostly confused-perseverating at times.  Multifactorial etiology-from recent CVA/contusion/seizures/COVID-19 infection/aspiration pneumonia.  Her prognosis is very poor-palliative care saw.  Unfortunately husband seems to have very poor understanding of her prognosis.  Acute/subacute right frontal lobe contusion: Recommendations from neurosurgery are to continue supportive care  New onset seizures: Likely due to recent CVA/frontal lobe contusion seen on MRI.  Started on Keppra after discussion with neurology on 9/8.  Repeat EEG on 9/8 with abnormal waves that places the patient at high risk for seizures  Chronic lymphocytic leukemia with anemia/thrombocytopenia: She follows with oncology at [facility] last oncology note on 04/07/2021-plans were to stop zanubrutinib and initiate hospice care.  (Husband had bad prior experience with 1st wife on hospice) Severe leukocytosis on labs -likely due to the CLL,  monitor.  Aspiration pneumonia: Her CT/chest x-ray imaging was more consistent with aspiration pneumonia rather than COVID-19 pneumonia.  Finished Unasyn course..   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  Review of records from [facility] that she received a dose of Evusheld in January 2022.  Unclear whether she was still taking zanubrutinib prior to this hospitalization as well.  She received a monoclonal antibody infusion on 9/6.  ESRD: On HD TTS-nephrology following for HD needs. -sat in chair for HD on 9/25  Anemia: Likely multifactorial due to CLL and ESRD.   -Transfused 1 unit of PRBC on 9/7.  Thrombocytopenia: Likely due to CLL-platelet count fluctuating-  Not sure if any further platelet transfusion is going to be beneficial given that she appears to have advanced CLL (transfused 1 unit of pheresis platelets on 9/6).  Follow CBC periodically.  CVA: Hold antiplatelets due to hemorrhage/contusion seen on MRI brain.  HTN:  -on meds, monitor  Hard of hearing- has hearing aides  DM-2 (A1c 6.0):  -SSI  Possible thrush -nystatin   GOC discussion with husband: Med Rx, DNR, no heroics.    Diet: Diet Order             DIET DYS 3 Room service appropriate? Yes with Assist; Fluid consistency: Thin  Diet effective now                    Code Status:  DNR   Disposition Plan: Status is: Inpatient  Remains inpatient appropriate because:Inpatient level of care appropriate due to severity of illness  Dispo: The patient is from: Home              Anticipated d/c is to:SNF- await insurance authorization                 Time spent: 35 minutes-Greater than 50% of this time was spent in counseling, explanation of  diagnosis, planning of further management, and coordination of care.  MEDICATIONS:  Scheduled Meds:  amLODipine  10 mg Oral Daily   carvedilol  12.5 mg Oral BID WC   chlorhexidine  15 mL Mouth Rinse BID   Chlorhexidine Gluconate Cloth  6 each Topical Q0600   doxercalciferol  2 mcg Intravenous Q T,Th,Sa-HD   hydrALAZINE  50 mg Oral Q8H   insulin aspart  0-6 Units Subcutaneous TID WC   levETIRAcetam  500 mg Oral Daily   And   levETIRAcetam  250 mg Oral Q T,Th,Sat-1800   mouth rinse  15 mL Mouth Rinse q12n4p   nystatin  5 mL Oral QID   Zinc Oxide   Topical TID   Continuous Infusions:   PRN Meds:.acetaminophen **OR** acetaminophen, hydrALAZINE, hydrocortisone-pramoxine, HYDROmorphone (DILAUDID) injection, LORazepam, ondansetron **OR** ondansetron (ZOFRAN) IV, oxyCODONE   PHYSICAL EXAM: Vital signs: Vitals:   07/18/21 1116 07/18/21 1120 07/18/21 1224 07/18/21 1237  BP: 140/90 (!) 171/99 (!) 130/55   Pulse: (!) 101 (!) 107 92   Resp: 20 18 18    Temp:  98.2 F (36.8 C) 98.6 F (37 C)   TempSrc:   Oral   SpO2:  97% (!) 88% 94%  Weight:  38.2 kg    Height:       Filed Weights   07/18/21 0410 07/18/21 0730 07/18/21 1120  Weight: 37.3 kg 38.2 kg 38.2 kg   Body mass index is 17.01 kg/m.    Exam  General: Appearance:    Thin female in no acute distress     Lungs:     respirations unlabored  Heart:    Normal heart rate.    MS:   All extremities are intact.    Neurologic:   Sleeping s/o dilaudid in HD           I have personally reviewed following labs and imaging studies  LABORATORY DATA: CBC: Recent Labs  Lab 07/13/21 0856 07/18/21 0826  WBC 97.7* 99.5*  HGB 14.3 11.3*  HCT 44.4 34.9*  MCV 82.2 81.7  PLT 65* 61*    Basic Metabolic Panel: Recent Labs  Lab 07/13/21 0856 07/18/21 0819  NA 131* 126*  K 4.8 5.7*  CL 96* 92*  CO2 22 20*  GLUCOSE 146* 110*  BUN 30* 64*  CREATININE 6.00* 7.49*  CALCIUM 9.3 8.4*  PHOS 4.3 6.8*    GFR: Estimated  Creatinine Clearance: 4.2 mL/min (A) (by C-G formula based on SCr of 7.49 mg/dL (H)).  Liver Function Tests: Recent Labs  Lab 07/13/21 0856 07/18/21 0819  ALBUMIN 2.7* 2.3*   No results for input(s): LIPASE, AMYLASE in the last 168 hours. No results for input(s): AMMONIA in the last 168 hours.   Coagulation Profile: No results for input(s): INR, PROTIME in the last 168 hours.   Cardiac Enzymes: No results for input(s): CKTOTAL, CKMB, CKMBINDEX, TROPONINI in the last 168 hours.  BNP (last 3 results) No results for input(s): PROBNP in the last 8760 hours.  Lipid Profile: No results for  input(s): CHOL, HDL, LDLCALC, TRIG, CHOLHDL, LDLDIRECT in the last 72 hours.  Thyroid Function Tests: No results for input(s): TSH, T4TOTAL, FREET4, T3FREE, THYROIDAB in the last 72 hours.   Anemia Panel: No results for input(s): VITAMINB12, FOLATE, FERRITIN, TIBC, IRON, RETICCTPCT in the last 72 hours.   Urine analysis:    Component Value Date/Time   COLORURINE STRAW (A) 01/12/2019 1130   APPEARANCEUR CLEAR 01/12/2019 1130   LABSPEC 1.007 01/12/2019 1130   PHURINE 7.0 01/12/2019 1130   GLUCOSEU 150 (A) 01/12/2019 1130   HGBUR NEGATIVE 01/12/2019 1130   BILIRUBINUR NEGATIVE 01/12/2019 1130   KETONESUR NEGATIVE 01/12/2019 1130   PROTEINUR >=300 (A) 01/12/2019 1130   NITRITE NEGATIVE 01/12/2019 1130   LEUKOCYTESUR NEGATIVE 01/12/2019 1130    Sepsis Labs: Lactic Acid, Venous    Component Value Date/Time   LATICACIDVEN 1.1 06/26/2021 2204     RADIOLOGY STUDIES/RESULTS: No results found.   LOS: 22 days   Gould DO on 07/18/2021 at 2:31 PM   -  To page go to [email or web address]

## 2021-07-19 DIAGNOSIS — N186 End stage renal disease: Secondary | ICD-10-CM | POA: Diagnosis not present

## 2021-07-19 DIAGNOSIS — C9110 Chronic lymphocytic leukemia of B-cell type not having achieved remission: Secondary | ICD-10-CM | POA: Diagnosis not present

## 2021-07-19 DIAGNOSIS — I10 Essential (primary) hypertension: Secondary | ICD-10-CM

## 2021-07-19 DIAGNOSIS — G9340 Encephalopathy, unspecified: Secondary | ICD-10-CM | POA: Diagnosis not present

## 2021-07-19 LAB — GLUCOSE, CAPILLARY
Glucose-Capillary: 116 mg/dL — ABNORMAL HIGH (ref 70–99)
Glucose-Capillary: 187 mg/dL — ABNORMAL HIGH (ref 70–99)
Glucose-Capillary: 132 mg/dL — ABNORMAL HIGH (ref 70–99)
Glucose-Capillary: 184 mg/dL — ABNORMAL HIGH (ref 70–99)

## 2021-07-19 NOTE — Progress Notes (Signed)
CSW note reviewed regarding snf starting insurance auth. Communicated with renal PA regarding need for pt to receive HD in chair. Will likely attempt HD in chair tomorrow pending pt medically stable to do so.  Melven Sartorius Renal Navigator [phone]

## 2021-07-19 NOTE — Progress Notes (Signed)
PROGRESS NOTE        PATIENT DETAILS Name: Christine Gibson Age: 70 y.o. Sex: female Date of Birth: May 14, 1951 Admit Date: 06/26/2021 Admitting Physician Etta Quill, DO [id number], Alyson Locket, NP  Brief Narrative: Patient is a 70 y.o. female ESRD on HD TTS, CLL, DM-2, HTN, HLD, recent CVA in July-presented to the hospital with confusion and low-grade fever.  Significant events: 9/5>> admit-for evaluation of confusion-MRI brain with acute/subacute right frontal hemorrhage/contusion. 9/7>>  witnessed seizure by nursing staff  Significant studies: 9/5>> CXR: Right midlung infiltrate. 9/6>> MRI brain: Anterior/inferior right frontal lobe-likely contusion. 9/6>> EEG: No seizures-moderate diffuse encephalopathy 9/6>> Serum SARS-COV-2 antibody IgG: Not detected. 9/8>> EEG: Generalized periodic discharges-ictal-interictal continuum with high potential for seizures  Antimicrobial therapy: Vancomycin: 9/5 x 1 Cefepime: 9/5>> 9/7 Zithromax: 9/5>> 9/7 Unasyn: 9/8>>  Microbiology data: 5/25>> COVID screening test positive at outside facility (per prior records) 7/19>> COVID PCR: Positive (CT value 24.4) 9/05>> COVID PCR: Positive (CT value 20.4) 9/05>> blood culture: 1/2 staff epidermidis (likely a contaminant)  9/07>> blood culture: No growth   Procedures : None  Consults: Renal Neurology Neurosurgery Palliative care  DVT Prophylaxis : SCDs Start: 06/26/21 2226   Subjective: Lying comfortably in bed-answer simple questions appropriately.  Assessment/Plan: Acute metabolic encephalopathy: Relatively awake and alert-mental status continues to fluctuate.   Multifactorial etiology-from recent CVA/contusion/seizures/COVID-19 infectio/aspiration pneumonia.  Acute/subacute right frontal lobe contusion: Recommendations from neurosurgery are to continue supportive care.    New onset seizures: Likely due to recent CVA/frontal lobe contusion seen on MRI.   Started on Keppra after discussion with neurology on 9/8.  Repeat EEG on 9/8 with abnormal waves that places the patient at high risk for seizures-neurology planning LTM EEG.  Aspiration pneumonia: Continue empiric antimicrobial therapy.  Her CT/chest x-ray imaging was more consistent with aspiration pneumonia rather than COVID-19 pneumonia.  Has completed a course of Unasyn.   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  Review of records from [facility] that she received a dose of Evusheld in January 2022.  Unclear whether she was still taking zanubrutinib prior to this hospitalization as well.  She received a monoclonal antibody infusion on 9/6.  ESRD: On HD TTS-nephrology following for HD needs.  Chronic lymphocytic leukemia with anemia/thrombocytopenia: Continues to have significant leukocytosis and thrombocytopenia-she follows with oncology at [facility] last oncology note on 04/07/2021-plans were to stop zanubrutinib and initiate hospice care. Unfortunately resistant to comfort measures-as he had bad experience with his first wife.  Anemia: Likely multifactorial due to CLL and ESRD.  Transfused 1 unit of PRBC on 9/7.  Follow CBC.    Thrombocytopenia: Likely due to CLL-platelet count low but stable over the past several days.  She was transfused 1 unit of pheresis platelets on 9/6.  Follow CBC periodically.    CVA: Hold antiplatelets due to hemorrhage/contusion seen on MRI brain.  HTN: BP stable-continue amlodipine, hydralazine and Coreg.    DM-2 (A1c 6.0): Diet controlled at home-stable with CBGs.  Recent Labs    07/18/21 2107 07/19/21 0759 07/19/21 1229  GLUCAP 216* 116* 187*     Hard of hearing  Palliative care:  Extensive discussion by palliative care team with spouse-DNR in place-spouse understands poor prognosis but wants to continue with medical treatment.    Diet: Diet Order             DIET DYS 3 Room service appropriate? Yes with Assist; Fluid consistency: Thin  Diet effective now                    Code Status: Full code   Family Communication: Spoke with Spouse [name]-[phone]-over phone on 9/9  Disposition Plan: Status is: Inpatient  Remains inpatient appropriate because:Inpatient level of care appropriate due to severity of illness  Dispo: The patient is from: Home              Anticipated d/c is to: Home              Patient currently is not medically stable to d/c.   Difficult to place patient No    Barriers to Discharge: Encephalopathic-recurrent COVID-19 infection-not yet stable for discharge-see above documentation.  Antimicrobial agents: Anti-infectives (From admission, onward)    Start     Dose/Rate Route Frequency Ordered Stop   06/29/21 2200  Ampicillin-Sulbactam (UNASYN) 3 g in sodium chloride 0.9 % 100 mL IVPB  Status:  Discontinued        3 g 200 mL/hr over 30 Minutes Intravenous Every 24 hours 06/29/21 1424 07/03/21 1026   06/27/21 2100  ceFEPIme (MAXIPIME) 1 g in sodium chloride 0.9 % 100 mL IVPB  Status:  Discontinued        1 g 200 mL/hr over 30 Minutes Intravenous Every 24 hours 06/26/21 1949 06/29/21 0749   06/26/21 2230  azithromycin (ZITHROMAX) 500 mg in sodium chloride 0.9 % 250 mL IVPB  Status:  Discontinued        500 mg 250 mL/hr over 60 Minutes Intravenous Every 24 hours 06/26/21 2221 06/29/21 0749   06/26/21 1930  vancomycin (VANCOCIN) IVPB 1000 mg/200 mL premix        1,000 mg 200 mL/hr over 60 Minutes Intravenous  Once 06/26/21 1846 06/26/21 2052   06/26/21 1900  ceFEPIme (MAXIPIME) 2 g in sodium chloride 0.9 % 100 mL IVPB        2 g 200 mL/hr over 30 Minutes Intravenous  Once 06/26/21 1846 06/26/21 1937   06/26/21 1845   vancomycin variable dose per unstable renal function (pharmacist dosing)  Status:  Discontinued         Does not apply See admin instructions 06/26/21 1846 06/26/21 2221        Time spent: 35 minutes-Greater than 50% of this time was spent in counseling, explanation of diagnosis, planning of further management, and coordination of care.  MEDICATIONS: Scheduled Meds:  amLODipine  10 mg Oral Daily   carvedilol  12.5 mg Oral BID WC   chlorhexidine  15 mL Mouth Rinse BID   Chlorhexidine Gluconate Cloth  6 each Topical Q0600   doxercalciferol  2 mcg Intravenous Q T,Th,Sa-HD   hydrALAZINE  50 mg Oral Q8H   insulin aspart  0-6 Units Subcutaneous TID WC   levETIRAcetam  500 mg Oral Daily   And   levETIRAcetam  250 mg Oral Q T,Th,Sat-1800   mouth rinse  15 mL Mouth Rinse q12n4p   nystatin  5 mL Oral QID   Zinc Oxide   Topical TID   Continuous Infusions:   PRN Meds:.acetaminophen **OR** acetaminophen, hydrALAZINE, hydrocortisone-pramoxine, HYDROmorphone (DILAUDID) injection, LORazepam, ondansetron **OR** ondansetron (ZOFRAN)  IV, oxyCODONE   PHYSICAL EXAM: Vital signs: Vitals:   07/19/21 0400 07/19/21 0700 07/19/21 0801 07/19/21 1236  BP: (!) 149/57  (!) 143/58 (!) 153/57  Pulse: 80  84 89  Resp: 19  18 18   Temp: 98.3 F (36.8 C)  (!) 97.2 F (36.2 C) 97.7 F (36.5 C)  TempSrc: Axillary  Oral Oral  SpO2: 90%  95% 90%  Weight:  39.1 kg    Height:       Filed Weights   07/18/21 0730 07/18/21 1120 07/19/21 0700  Weight: 38.2 kg 38.2 kg 39.1 kg   Body mass index is 17.41 kg/m.   Gen Exam:Alert awake-pleasantly confused. HEENT:atraumatic, normocephalic Chest: B/L clear to auscultation anteriorly CVS:S1S2 regular Abdomen:soft non tender, non distended Extremities:no edema Neurology: Non focal Skin: no rash   I have personally reviewed following labs and imaging studies  LABORATORY DATA: CBC: Recent Labs  Lab 07/13/21 0856 07/18/21 0826  WBC 97.7* 99.5*  HGB  14.3 11.3*  HCT 44.4 34.9*  MCV 82.2 81.7  PLT 65* 61*     Basic Metabolic Panel: Recent Labs  Lab 07/13/21 0856 07/18/21 0819  NA 131* 126*  K 4.8 5.7*  CL 96* 92*  CO2 22 20*  GLUCOSE 146* 110*  BUN 30* 64*  CREATININE 6.00* 7.49*  CALCIUM 9.3 8.4*  PHOS 4.3 6.8*     GFR: Estimated Creatinine Clearance: 4.3 mL/min (A) (by C-G formula based on SCr of 7.49 mg/dL (H)).  Liver Function Tests: Recent Labs  Lab 07/13/21 0856 07/18/21 0819  ALBUMIN 2.7* 2.3*    No results for input(s): LIPASE, AMYLASE in the last 168 hours. No results for input(s): AMMONIA in the last 168 hours.   Coagulation Profile: No results for input(s): INR, PROTIME in the last 168 hours.   Cardiac Enzymes: No results for input(s): CKTOTAL, CKMB, CKMBINDEX, TROPONINI in the last 168 hours.  BNP (last 3 results) No results for input(s): PROBNP in the last 8760 hours.  Lipid Profile: No results for input(s): CHOL, HDL, LDLCALC, TRIG, CHOLHDL, LDLDIRECT in the last 72 hours.  Thyroid Function Tests: No results for input(s): TSH, T4TOTAL, FREET4, T3FREE, THYROIDAB in the last 72 hours.   Anemia Panel: No results for input(s): VITAMINB12, FOLATE, FERRITIN, TIBC, IRON, RETICCTPCT in the last 72 hours.   Urine analysis:    Component Value Date/Time   COLORURINE STRAW (A) 01/12/2019 1130   APPEARANCEUR CLEAR 01/12/2019 1130   LABSPEC 1.007 01/12/2019 1130   PHURINE 7.0 01/12/2019 1130   GLUCOSEU 150 (A) 01/12/2019 1130   HGBUR NEGATIVE 01/12/2019 1130   BILIRUBINUR NEGATIVE 01/12/2019 1130   KETONESUR NEGATIVE 01/12/2019 1130   PROTEINUR >=300 (A) 01/12/2019 1130   NITRITE NEGATIVE 01/12/2019 1130   LEUKOCYTESUR NEGATIVE 01/12/2019 1130    Sepsis Labs: Lactic Acid, Venous    Component Value Date/Time   LATICACIDVEN 1.1 06/26/2021 2204    MICROBIOLOGY: No results found for this or any previous visit (from the past 240 hour(s)).   RADIOLOGY STUDIES/RESULTS: No results  found.   LOS: 23 days   Oren Binet, MD  [facility]    To contact the attending provider between 7A-7P or the covering provider during after hours 7P-7A, please log into the web site [email or web address] and access using universal [place] password for that web site. If you do not have the password, please call the hospital operator.  07/19/2021, 2:23 PM

## 2021-07-19 NOTE — Progress Notes (Signed)
  [facility] Progress Note   Subjective:  Seen in room, remains frail. Denies CP, slightly confused today.   Objective Vitals:   07/19/21 0019 07/19/21 0400 07/19/21 0700 07/19/21 0801  BP: 132/71 (!) 149/57  (!) 143/58  Pulse: 93 80  84  Resp: 18 19  18   Temp: 97.9 F (36.6 C) 98.3 F (36.8 C)  (!) 97.2 F (36.2 C)  TempSrc: Oral Axillary  Oral  SpO2: 94% 90%  95%  Weight:   39.1 kg   Height:       Physical Exam General: Chronically ill and frail appearing woman, NAD Heart: RRR; 2/6 murmur Lungs: CTA anteriorly Abdomen: soft Extremities: No LE edema Dialysis Access: LUE AVF + thrill  Additional Objective Labs: Basic Metabolic Panel: Recent Labs  Lab 07/13/21 0856 07/18/21 0819  NA 131* 126*  K 4.8 5.7*  CL 96* 92*  CO2 22 20*  GLUCOSE 146* 110*  BUN 30* 64*  CREATININE 6.00* 7.49*  CALCIUM 9.3 8.4*  PHOS 4.3 6.8*   Liver Function Tests: Recent Labs  Lab 07/13/21 0856 07/18/21 0819  ALBUMIN 2.7* 2.3*   CBC: Recent Labs  Lab 07/13/21 0856 07/18/21 0826  WBC 97.7* 99.5*  HGB 14.3 11.3*  HCT 44.4 34.9*  MCV 82.2 81.7  PLT 65* 61*   Medications:   amLODipine  10 mg Oral Daily   carvedilol  12.5 mg Oral BID WC   chlorhexidine  15 mL Mouth Rinse BID   Chlorhexidine Gluconate Cloth  6 each Topical Q0600   doxercalciferol  2 mcg Intravenous Q T,Th,Sa-HD   hydrALAZINE  50 mg Oral Q8H   insulin aspart  0-6 Units Subcutaneous TID WC   levETIRAcetam  500 mg Oral Daily   And   levETIRAcetam  250 mg Oral Q T,Th,Sat-1800   mouth rinse  15 mL Mouth Rinse q12n4p   nystatin  5 mL Oral QID   Zinc Oxide   Topical TID    Dialysis Orders: Aspiration PNA - RML s/p course of IV Unasyn. AMS - multifactorial with recent CVA, contusion, seizures, COVID-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  She has a poor prognosis, waxing and wanning mental status.  Nephrology has charted support for transition to comfort care/ hospice.  Patient husband adamantly  refusing hospice due to negative experience with 1st wife per notes.  ESRD: Continue HD per TTS schedule - next tomorrow. BP dropping q HD - follow. Chronic COVID infection/FTT: pt was COVID + first during May 2022 admit, has been testing + repeatedly since then. Negative testing for COVID antibodies per charting. Off isolation precautions. Acute/ subactue R frontal lobe contusion: per Neurosurgery - no surg needed CLL/ chronic thrombocytopenia/severe leukocytosis - f/u [facility]. Last seen in June 2022 by [facility] and plans were to stop zanubrutinib and initiate hospice care.  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 No ESA indicated. Secondary HPTH: Ca ok, Phos a little high - may need to resume binder. HTN/ volume: BP stable, but then drops with HD. No signs of overload. Remains on Coreg/hydral/amlodipine - may need to reduce dose. FTT/Dispo: Clearly declining. Husband has refused hospice. Plan to SNF once stable.    Veneta Penton, PA-C 07/19/2021, 10:36 AM  [facility]

## 2021-07-19 NOTE — TOC Progression Note (Signed)
Transition of Care [facility]) - Progression Note    Patient Details  Name: Christine Gibson MRN: [id number] Date of Birth: [birth year]  Transition of Care [facility]) CM/SW [place], LCSW Phone Number: 07/19/2021, 3:37 PM  Clinical Narrative:    CSW sent updated therapy notes to [facility].  [facility] contracted facilities:  -CSW contacted [facility] again and faxed referral to [phone].  -CSW left message for [facility] in [place].  -[facility]- left vm -[facility]- left vm and faxed referral to [phone]    Expected Discharge Plan: [place] Barriers to Discharge:  (needs to sit for op HD)  Expected Discharge Plan and Services Expected Discharge Plan: [place] In-house Referral: Clinical Social Work, Hospice / [place] Acute Care Choice: Hospice Living arrangements for the past 2 months: Single Family Home                                       Social Determinants of Health (SDOH) Interventions    Readmission Risk Interventions No flowsheet data found.

## 2021-07-20 DIAGNOSIS — N186 End stage renal disease: Secondary | ICD-10-CM | POA: Diagnosis not present

## 2021-07-20 DIAGNOSIS — N250 Renal osteodystrophy: Secondary | ICD-10-CM | POA: Diagnosis not present

## 2021-07-20 DIAGNOSIS — R531 Weakness: Secondary | ICD-10-CM | POA: Diagnosis not present

## 2021-07-20 DIAGNOSIS — I619 Nontraumatic intracerebral hemorrhage, unspecified: Secondary | ICD-10-CM | POA: Diagnosis not present

## 2021-07-20 DIAGNOSIS — M6281 Muscle weakness (generalized): Secondary | ICD-10-CM | POA: Diagnosis not present

## 2021-07-20 DIAGNOSIS — U071 COVID-19: Secondary | ICD-10-CM | POA: Diagnosis not present

## 2021-07-20 DIAGNOSIS — D8481 Immunodeficiency due to conditions classified elsewhere: Secondary | ICD-10-CM | POA: Diagnosis present

## 2021-07-20 DIAGNOSIS — J189 Pneumonia, unspecified organism: Secondary | ICD-10-CM | POA: Diagnosis not present

## 2021-07-20 DIAGNOSIS — J9601 Acute respiratory failure with hypoxia: Secondary | ICD-10-CM | POA: Diagnosis not present

## 2021-07-20 DIAGNOSIS — I10 Essential (primary) hypertension: Secondary | ICD-10-CM | POA: Diagnosis not present

## 2021-07-20 DIAGNOSIS — J329 Chronic sinusitis, unspecified: Secondary | ICD-10-CM | POA: Diagnosis not present

## 2021-07-20 DIAGNOSIS — C9110 Chronic lymphocytic leukemia of B-cell type not having achieved remission: Secondary | ICD-10-CM | POA: Diagnosis not present

## 2021-07-20 DIAGNOSIS — Z515 Encounter for palliative care: Secondary | ICD-10-CM | POA: Diagnosis not present

## 2021-07-20 DIAGNOSIS — D631 Anemia in chronic kidney disease: Secondary | ICD-10-CM | POA: Diagnosis not present

## 2021-07-20 DIAGNOSIS — R079 Chest pain, unspecified: Secondary | ICD-10-CM | POA: Diagnosis not present

## 2021-07-20 DIAGNOSIS — J90 Pleural effusion, not elsewhere classified: Secondary | ICD-10-CM | POA: Diagnosis not present

## 2021-07-20 DIAGNOSIS — R54 Age-related physical debility: Secondary | ICD-10-CM | POA: Diagnosis present

## 2021-07-20 DIAGNOSIS — R0789 Other chest pain: Secondary | ICD-10-CM | POA: Diagnosis not present

## 2021-07-20 DIAGNOSIS — I509 Heart failure, unspecified: Secondary | ICD-10-CM | POA: Diagnosis not present

## 2021-07-20 DIAGNOSIS — Z681 Body mass index (BMI) 19 or less, adult: Secondary | ICD-10-CM | POA: Diagnosis not present

## 2021-07-20 DIAGNOSIS — I5043 Acute on chronic combined systolic (congestive) and diastolic (congestive) heart failure: Secondary | ICD-10-CM | POA: Diagnosis present

## 2021-07-20 DIAGNOSIS — Z8616 Personal history of COVID-19: Secondary | ICD-10-CM | POA: Diagnosis not present

## 2021-07-20 DIAGNOSIS — I639 Cerebral infarction, unspecified: Secondary | ICD-10-CM | POA: Diagnosis not present

## 2021-07-20 DIAGNOSIS — E1122 Type 2 diabetes mellitus with diabetic chronic kidney disease: Secondary | ICD-10-CM | POA: Diagnosis present

## 2021-07-20 DIAGNOSIS — R000 Tachycardia, unspecified: Secondary | ICD-10-CM | POA: Diagnosis not present

## 2021-07-20 DIAGNOSIS — E8809 Other disorders of plasma-protein metabolism, not elsewhere classified: Secondary | ICD-10-CM | POA: Diagnosis present

## 2021-07-20 DIAGNOSIS — Z992 Dependence on renal dialysis: Secondary | ICD-10-CM | POA: Diagnosis not present

## 2021-07-20 DIAGNOSIS — I69320 Aphasia following cerebral infarction: Secondary | ICD-10-CM | POA: Diagnosis not present

## 2021-07-20 DIAGNOSIS — I120 Hypertensive chronic kidney disease with stage 5 chronic kidney disease or end stage renal disease: Secondary | ICD-10-CM | POA: Diagnosis not present

## 2021-07-20 DIAGNOSIS — G9340 Encephalopathy, unspecified: Secondary | ICD-10-CM | POA: Diagnosis not present

## 2021-07-20 DIAGNOSIS — R279 Unspecified lack of coordination: Secondary | ICD-10-CM | POA: Diagnosis not present

## 2021-07-20 DIAGNOSIS — R0902 Hypoxemia: Secondary | ICD-10-CM | POA: Diagnosis not present

## 2021-07-20 DIAGNOSIS — D709 Neutropenia, unspecified: Secondary | ICD-10-CM | POA: Diagnosis present

## 2021-07-20 DIAGNOSIS — I69398 Other sequelae of cerebral infarction: Secondary | ICD-10-CM | POA: Diagnosis not present

## 2021-07-20 DIAGNOSIS — C9100 Acute lymphoblastic leukemia not having achieved remission: Secondary | ICD-10-CM | POA: Diagnosis present

## 2021-07-20 DIAGNOSIS — R5381 Other malaise: Secondary | ICD-10-CM | POA: Diagnosis not present

## 2021-07-20 DIAGNOSIS — E119 Type 2 diabetes mellitus without complications: Secondary | ICD-10-CM | POA: Diagnosis not present

## 2021-07-20 DIAGNOSIS — A630 Anogenital (venereal) warts: Secondary | ICD-10-CM | POA: Diagnosis not present

## 2021-07-20 DIAGNOSIS — I132 Hypertensive heart and chronic kidney disease with heart failure and with stage 5 chronic kidney disease, or end stage renal disease: Secondary | ICD-10-CM | POA: Diagnosis not present

## 2021-07-20 DIAGNOSIS — G8929 Other chronic pain: Secondary | ICD-10-CM | POA: Diagnosis not present

## 2021-07-20 DIAGNOSIS — R4189 Other symptoms and signs involving cognitive functions and awareness: Secondary | ICD-10-CM | POA: Diagnosis not present

## 2021-07-20 DIAGNOSIS — Z743 Need for continuous supervision: Secondary | ICD-10-CM | POA: Diagnosis not present

## 2021-07-20 DIAGNOSIS — Z20822 Contact with and (suspected) exposure to covid-19: Secondary | ICD-10-CM | POA: Diagnosis not present

## 2021-07-20 DIAGNOSIS — N2581 Secondary hyperparathyroidism of renal origin: Secondary | ICD-10-CM | POA: Diagnosis present

## 2021-07-20 DIAGNOSIS — E43 Unspecified severe protein-calorie malnutrition: Secondary | ICD-10-CM | POA: Diagnosis not present

## 2021-07-20 DIAGNOSIS — R627 Adult failure to thrive: Secondary | ICD-10-CM | POA: Diagnosis present

## 2021-07-20 DIAGNOSIS — Q612 Polycystic kidney, adult type: Secondary | ICD-10-CM | POA: Diagnosis not present

## 2021-07-20 DIAGNOSIS — I3139 Other pericardial effusion (noninflammatory): Secondary | ICD-10-CM | POA: Diagnosis not present

## 2021-07-20 DIAGNOSIS — I69391 Dysphagia following cerebral infarction: Secondary | ICD-10-CM | POA: Diagnosis not present

## 2021-07-20 DIAGNOSIS — Z7189 Other specified counseling: Secondary | ICD-10-CM | POA: Diagnosis not present

## 2021-07-20 DIAGNOSIS — R4182 Altered mental status, unspecified: Secondary | ICD-10-CM | POA: Diagnosis not present

## 2021-07-20 DIAGNOSIS — I700 Atherosclerosis of aorta: Secondary | ICD-10-CM | POA: Diagnosis not present

## 2021-07-20 DIAGNOSIS — D696 Thrombocytopenia, unspecified: Secondary | ICD-10-CM | POA: Diagnosis not present

## 2021-07-20 DIAGNOSIS — R410 Disorientation, unspecified: Secondary | ICD-10-CM | POA: Diagnosis not present

## 2021-07-20 DIAGNOSIS — G40909 Epilepsy, unspecified, not intractable, without status epilepticus: Secondary | ICD-10-CM | POA: Diagnosis not present

## 2021-07-20 DIAGNOSIS — Z66 Do not resuscitate: Secondary | ICD-10-CM | POA: Diagnosis not present

## 2021-07-20 DIAGNOSIS — R072 Precordial pain: Secondary | ICD-10-CM | POA: Diagnosis not present

## 2021-07-20 DIAGNOSIS — G9341 Metabolic encephalopathy: Secondary | ICD-10-CM | POA: Diagnosis not present

## 2021-07-20 DIAGNOSIS — G319 Degenerative disease of nervous system, unspecified: Secondary | ICD-10-CM | POA: Diagnosis not present

## 2021-07-20 DIAGNOSIS — J811 Chronic pulmonary edema: Secondary | ICD-10-CM | POA: Diagnosis not present

## 2021-07-20 LAB — RENAL FUNCTION PANEL
Albumin: 2.4 g/dL — ABNORMAL LOW (ref 3.5–5.0)
Anion gap: 13 (ref 5–15)
BUN: 46 mg/dL — ABNORMAL HIGH (ref 8–23)
CO2: 24 mmol/L (ref 22–32)
Calcium: 8.5 mg/dL — ABNORMAL LOW (ref 8.9–10.3)
Chloride: 94 mmol/L — ABNORMAL LOW (ref 98–111)
Creatinine, Ser: 5.9 mg/dL — ABNORMAL HIGH (ref 0.44–1.00)
GFR, Estimated: 7 mL/min — ABNORMAL LOW (ref >60)
Glucose, Bld: 133 mg/dL — ABNORMAL HIGH (ref 70–99)
Phosphorus: 5.7 mg/dL — ABNORMAL HIGH (ref 2.5–4.6)
Potassium: 5 mmol/L (ref 3.5–5.1)
Sodium: 131 mmol/L — ABNORMAL LOW (ref 135–145)

## 2021-07-20 LAB — HEPATITIS B SURFACE ANTIGEN: Hepatitis B Surface Ag: NONREACTIVE

## 2021-07-20 LAB — GLUCOSE, CAPILLARY
Glucose-Capillary: 128 mg/dL — ABNORMAL HIGH (ref 70–99)
Glucose-Capillary: 157 mg/dL — ABNORMAL HIGH (ref 70–99)

## 2021-07-20 LAB — HEPATITIS B SURFACE ANTIBODY,QUALITATIVE: Hep B S Ab: REACTIVE — AB

## 2021-07-20 LAB — HEPATITIS B CORE ANTIBODY, TOTAL: Hep B Core Total Ab: NONREACTIVE

## 2021-07-20 MED ORDER — INSULIN ASPART 100 UNIT/ML IJ SOLN: INTRAMUSCULAR | 11 refills | Status: DC

## 2021-07-20 MED ORDER — HYDRALAZINE HCL 50 MG PO TABS: 50.0000 mg | ORAL_TABLET | Freq: Three times a day (TID) | ORAL | Status: DC

## 2021-07-20 MED ORDER — PANTOPRAZOLE SODIUM 40 MG PO TBEC: 40.0000 mg | DELAYED_RELEASE_TABLET | Freq: Two times a day (BID) | ORAL | Status: DC

## 2021-07-20 MED ORDER — OXYCODONE HCL 5 MG PO TABS: 5.0000 mg | ORAL_TABLET | Freq: Four times a day (QID) | ORAL | 0 refills | Status: DC | PRN

## 2021-07-20 MED ORDER — AMLODIPINE BESYLATE 5 MG PO TABS: 10.0000 mg | ORAL_TABLET | Freq: Every day | ORAL | Status: DC

## 2021-07-20 MED ORDER — LEVETIRACETAM 250 MG PO TABS: 250.0000 mg | ORAL_TABLET | ORAL | Status: DC

## 2021-07-20 MED ORDER — LEVETIRACETAM 500 MG PO TABS: 500.0000 mg | ORAL_TABLET | Freq: Every day | ORAL | Status: DC

## 2021-07-20 NOTE — Discharge Summary (Signed)
PATIENT DETAILS Name: Christine Gibson Age: 70 y.o. Sex: female Date of Birth: 10-08-1951 MRN: [id number]. Admitting Physician: Etta Quill, DO DPO:EUMPN, Alyson Locket, NP  Admit Date: 06/26/2021 Discharge date: 07/20/2021  Recommendations for Outpatient Follow-up:  Follow up with PCP in 1-2 weeks Please obtain CMP/CBC in one week Please ensure follow-up with palliative care while at [facility].  Admitted From:  Home  Disposition: SNF   Home Health: No  Equipment/Devices: None  Discharge Condition: Stable  CODE STATUS: DNR  Diet recommendation:  Diet Order             Diet - low sodium heart healthy           Diet Carb Modified           DIET DYS 3 Room service appropriate? Yes with Assist; Fluid consistency: Thin  Diet effective now                    Brief Summary: Patient is a 70 y.o. female ESRD on HD TTS, CLL, DM-2, HTN, HLD, recent CVA in July-presented to the hospital with confusion and low-grade fever.   Significant events: 9/5>> admit-for evaluation of confusion-MRI brain with acute/subacute right frontal hemorrhage/contusion. 9/7>>  witnessed seizure by nursing staff   Significant studies: 9/5>> CXR: Right midlung infiltrate. 9/6>> MRI brain: Anterior/inferior right frontal lobe-likely contusion. 9/6>> EEG: No seizures-moderate diffuse encephalopathy 9/6>> Serum SARS-COV-2 antibody IgG: Not detected. 9/8>> EEG: Generalized periodic discharges-ictal-interictal continuum with high potential for seizures   Antimicrobial therapy: Vancomycin: 9/5 x 1 Cefepime: 9/5>> 9/7 Zithromax: 9/5>> 9/7 Unasyn: 9/8>>9/11   Microbiology data: 5/25>> COVID screening test positive at outside facility (per prior records) 7/19>> COVID PCR: Positive (CT value 24.4) 9/05>> COVID PCR: Positive (CT value 20.4) 9/05>> blood culture: 1/2 staff epidermidis (likely a contaminant)  9/07>> blood culture: No growth    Procedures : None    Consults: Renal Neurology Neurosurgery Palliative care  Brief Hospital Course: Acute metabolic encephalopathy: Relatively awake and alert-mental status continues to fluctuate.   Multifactorial etiology-from recent CVA/contusion/seizures/COVID-19 infectio/aspiration pneumonia.   Acute/subacute right frontal lobe contusion: Recommendations from neurosurgery are to continue supportive care.     New onset seizures: Likely due to recent CVA/frontal lobe contusion seen on MRI.  Evaluated by neurology-underwent LTM EEG monitoring-now stable on Keppra without any further seizures.    Aspiration pneumonia: Continue empiric antimicrobial therapy.  Her CT/chest x-ray imaging was more consistent with aspiration pneumonia rather than COVID-19 pneumonia.  Has completed a course of Unasyn.    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  Review of records from [facility] that she received a dose of Evusheld in January 2022.  Unclear whether she was still taking zanubrutinib prior to this hospitalization as well.  She received a monoclonal antibody infusion on 9/6.   ESRD: On HD TTS-nephrology following for HD needs.   Chronic lymphocytic leukemia with anemia/thrombocytopenia: Continues to have significant leukocytosis and thrombocytopenia-she follows with oncology at [facility] last oncology note on 04/07/2021-plans were to stop zanubrutinib and initiate hospice care. Unfortunately resistant to comfort measures-as he had bad experience with his first wife.   Anemia: Likely multifactorial due to CLL and ESRD.  Transfused 1 unit of PRBC on 9/7.  Follow  CBC periodically.     Thrombocytopenia: Likely due to CLL-platelet count low but stable over  the past several days.  She was transfused 1 unit of pheresis platelets on 9/6.  Follow CBC periodically.     CVA: Hold antiplatelets due to hemorrhage/contusion seen on MRI brain.   HTN: BP stable-continue amlodipine, hydralazine and Coreg.     DM-2 (A1c 6.0): Diet controlled at home-stable with CBGs.  Hard of hearing   Palliative care: Extensive discussion by palliative care team with spouse-DNR in place-spouse understands poor prognosis but wants to continue with medical treatment.  Please ask for palliative care follow-up at SNF for continued goals of care discussion/engagement with family.   Discharge Diagnoses:  Principal Problem:   Acute encephalopathy Active Problems:   CLL (chronic lymphocytic leukemia) (HCC)   HTN (hypertension)   DM (diabetes mellitus), type 2 (HCC)   Anal warts   Acute CVA (cerebrovascular accident) ([place])   Thrombocytopenia ([place])   COVID-19   ESRD needing dialysis ([place])   Community acquired pneumonia of right middle lobe of lung   ICH (intracerebral hemorrhage) ([place])   Goals of care, counseling/discussion   Palliative care by specialist   Discharge Instructions:  Activity:  As tolerated with Full fall precautions use walker/cane & assistance as needed  Discharge Instructions     Diet - low sodium heart healthy   Complete by: As directed    Diet Carb Modified   Complete by: As directed    Discharge instructions   Complete by: As directed    Follow with Primary MD  Michela Pitcher, NP in 1-2 weeks  Please get a complete blood count and chemistry panel checked by your Primary MD at your next visit, and again as instructed by your Primary MD.  Get Medicines reviewed and adjusted: Please take all your medications with you for your next visit with your Primary MD  Laboratory/radiological data: Please request your Primary MD to go over all hospital tests and procedure/radiological results at the follow up, please ask your Primary MD to get all  Hospital records sent to his/her office.  In some cases, they will be blood work, cultures and biopsy results pending at the time of your discharge. Please request that your primary care M.D. follows up on these results.  Also Note the following: If you experience worsening of your admission symptoms, develop shortness of breath, life threatening emergency, suicidal or homicidal thoughts you must seek medical attention immediately by calling 911 or calling your MD immediately  if symptoms less severe.  You must read complete instructions/literature along with all the possible adverse reactions/side effects for all the Medicines you take and that have been prescribed to you. Take any new Medicines after you have completely understood and accpet all the possible adverse reactions/side effects.   Do not drive when taking Pain medications or sleeping medications (Benzodaizepines)  Do not take more than prescribed Pain, Sleep and Anxiety Medications. It is not advisable to combine anxiety,sleep and pain medications without talking with your primary care practitioner  Special Instructions: If you have smoked or chewed Tobacco  in the last 2 yrs please stop smoking, stop any regular Alcohol  and or any Recreational drug use.  Wear Seat belts while driving.  Please note: You were cared for by a hospitalist during your hospital stay. Once you are discharged, your primary care physician will handle any further medical issues. Please note that NO REFILLS for any discharge medications will be authorized once  you are discharged, as it is imperative that you return to your primary care physician (or establish a relationship with a primary care physician if you do not have one) for your post hospital discharge needs so that they can reassess your need for medications and monitor your lab values.   1.)  CBGs before meals and at bedtime   Increase activity slowly   Complete by: As directed    No wound care    Complete by: As directed       Allergies as of 07/20/2021       Reactions   Lisinopril Cough        Medication List     STOP taking these medications    benzonatate 100 MG capsule Commonly known as: TESSALON   Brukinsa 80 MG Caps Generic drug: Zanubrutinib   guaiFENesin 100 MG/5ML Soln Commonly known as: ROBITUSSIN   Hyoscyamine Sulfate SL 0.125 MG Subl   LORazepam 0.5 MG tablet Commonly known as: ATIVAN   losartan 100 MG tablet Commonly known as: COZAAR   meclizine 25 MG tablet Commonly known as: ANTIVERT   morphine 20 MG/ML concentrated solution Commonly known as: ROXANOL   NIFEdipine 60 MG 24 hr tablet Commonly known as: ADALAT CC   ondansetron 4 MG tablet Commonly known as: ZOFRAN   sertraline 100 MG tablet Commonly known as: ZOLOFT       TAKE these medications    acetaminophen 325 MG tablet Commonly known as: TYLENOL Take 650 mg by mouth every 4 (four) hours as needed for mild pain or fever.   albuterol 108 (90 Base) MCG/ACT inhaler Commonly known as: VENTOLIN HFA Inhale 1 puff into the lungs every 6 (six) hours as needed for wheezing or shortness of breath.   amLODipine 5 MG tablet Commonly known as: NORVASC Take 2 tablets (10 mg total) by mouth daily. What changed: how much to take   atorvastatin 40 MG tablet Commonly known as: LIPITOR Take 40 mg by mouth daily.   Bactroban Nasal 2 % Generic drug: mupirocin nasal ointment Place 1 application into the nose 2 (two) times daily. Apply to sore on nostril twice daily   carvedilol 25 MG tablet Commonly known as: COREG Take 25 mg by mouth 2 (two) times daily with a meal.   hydrALAZINE 50 MG tablet Commonly known as: APRESOLINE Take 1 tablet (50 mg total) by mouth every 8 (eight) hours.   hydrocortisone-pramoxine 2.5-1 % rectal cream Commonly known as: ANALPRAM-HC Place 1 application rectally 3 (three) times daily.   insulin aspart 100 UNIT/ML injection Commonly known as:  novoLOG 0-6 Units, Subcutaneous, 3 times daily with meals CBG < 70: Implement Hypoglycemia measures CBG 70 - 120: 0 units CBG 121 - 150: 0 units CBG 151 - 200: 1 unit CBG 201-250: 2 units CBG 251-300: 3 units CBG 301-350: 4 units CBG 351-400: 5 units CBG > 400: Give 6 units and call MD   levETIRAcetam 500 MG tablet Commonly known as: KEPPRA Take 1 tablet (500 mg total) by mouth daily.   levETIRAcetam 250 MG tablet Commonly known as: KEPPRA Take 1 tablet (250 mg total) by mouth every Tuesday, Thursday, and Saturday at 6 PM.   lidocaine 5 % ointment Commonly known as: XYLOCAINE Apply 1 application topically 2 (two) times daily as needed for moderate pain (rectal pain).   loratadine 10 MG tablet Commonly known as: CLARITIN Take 10 mg by mouth daily.   melatonin 3 MG Tabs tablet Take 3 mg by mouth  daily as needed (sleep).   oxyCODONE 5 MG immediate release tablet Commonly known as: Oxy IR/ROXICODONE Take 1 tablet (5 mg total) by mouth every 6 (six) hours as needed. What changed:  when to take this reasons to take this   pantoprazole 40 MG tablet Commonly known as: PROTONIX Take 1 tablet (40 mg total) by mouth 2 (two) times daily.        Contact information for follow-up providers     Michela Pitcher, NP. Schedule an appointment as soon as possible for a visit in 1 week(s).   Specialty: Pain Medicine Contact information: [place] [address] [phone]              Contact information for after-discharge care     Destination     [facility] Preferred SNF .   Service: Skilled Nursing Contact information: [address] [address] [phone]                    Allergies  Allergen Reactions   Lisinopril Cough     Other Procedures/Studies: DG Chest 1 View  Result Date: 06/26/2021 CLINICAL DATA:  Fever. EXAM: CHEST  1 VIEW COMPARISON:  Chest x-ray 07/09/2021. FINDINGS: Cardiac silhouette  is enlarged, unchanged. Small left pleural effusion and patchy opacities in the left lung base persists. There is new focal opacity in medial right mid lung overlying the right hilar region. There may also be some new right hilar enlargement. There is no evidence for pneumothorax. No acute fractures are seen. IMPRESSION: 1. New right midlung infiltrate. Questionable new right hilar enlargement. Findings may be related to pneumonia with right hilar lymphadenopathy. Short-term follow-up two views chest and/or CT recommended to exclude underlying mass at this level. 2. Stable cardiomegaly. 3. Stable small left pleural effusion with left basilar atelectasis/airspace disease. Electronically Signed   By: Ronney Asters M.D.   On: 06/26/2021 18:15   CT HEAD WO CONTRAST (5MM)  Result Date: 06/26/2021 CLINICAL DATA:  Altered mental status. EXAM: CT HEAD WITHOUT CONTRAST TECHNIQUE: Contiguous axial images were obtained from the base of the skull through the vertex without intravenous contrast. COMPARISON:  May 09, 2021 FINDINGS: Brain: Questionable new hypoattenuating area in the right thalamus/basal ganglia on image 15/4. Areas of encephalomalacia and calcifications in the bilateral cerebellum, larger on the right, previously evaluated on MRI May 09, 2021 and consistent with prior infarctions. Moderate burden chronic ischemic white matter disease. Lacunar type infarct in the left thalamus. Global parenchymal volume loss with ex vacuo dilatation of ventricular system. Vascular: No hyperdense vessel. Calcified intracranial atherosclerosis. Skull: No acute fracture. Sinuses/Orbits: Near complete opacification of the paranasal sinuses, similar to prior. Other: Progressive opacification of bilateral mastoid sinuses with bilateral middle ear fluid. IMPRESSION: 1. Questionable hypoattenuating area in the right thalamus/basal ganglia which may reflect an acute/subacute infarction. Consider further evaluation with MRI brain. 2.  Otherwise similar appearance of chronic findings including prior infarctions and chronic ischemic white matter disease. 3. Progressive opacification of bilateral mastoid sinuses and bilateral middle ear fluid. 4. Pansinusitis. These results were called by telephone at the time of interpretation on 06/26/2021 at 9:33 pm to provider Dr. Verner Chol, who verbally acknowledged these results. Electronically Signed   By: Dahlia Bailiff M.D.   On: 06/26/2021 21:35   CT CHEST WO CONTRAST  Result Date: 06/27/2021 CLINICAL DATA:  Question right hilar infiltrate on chest radiograph EXAM: CT CHEST WITHOUT CONTRAST TECHNIQUE: Multidetector CT imaging of  the chest was performed following the standard protocol without IV contrast. COMPARISON:  Chest radiograph 06/26/2021 FINDINGS: Cardiovascular: The heart is at the upper limits of normal for size. There is trace pericardial fluid. There are aortic valve calcifications and coronary artery calcifications. There is calcified atherosclerotic plaque of the thoracic aorta. Mediastinum/Nodes: The thyroid is unremarkable. The esophagus is not well assessed. There is no bulky mediastinal lymphadenopathy. There are a few prominent right axillary lymph nodes measuring up to 7 mm, nonspecific. There is fullness in the left suprahilar region, incompletely characterized in the absence of intravenous contrast but favored to reflect a vascular structure. Lungs/Pleura: The trachea is patent. There is occlusion of the left lower lobe bronchus probably by mucoid secretions. There is complete postobstructive atelectasis of the left lower lobe. There is also near complete collapse of the right lower lobe with patchy opacities in the aerated portion of the lateral segment. There are patchy ground-glass opacities in the right apex As above, there is fullness in the left suprahilar region (3-18). There is no pleural effusion or pneumothorax. Upper Abdomen: The kidneys are atrophic. The spleen is enlarged  measuring up to 13.8 cm AP. Musculoskeletal: There is slight deformity of the left eleventh rib consistent with prior trauma. There is no acute osseous abnormality. IMPRESSION: 1. Debris in the left lower lobe bronchus with complete collapse of the left lower lobe, and near complete collapse of the right middle lobe likely due to aspiration with mucous plugging. 2. Patchy opacities in the aerated right middle lobe and right apex also favored to reflect aspiration. 3. Fullness in the left suprahilar region is favored to reflect vasculature; however, lymphadenopathy can not be excluded in the absence of intravenous contrast. 4. Recommend follow-up CT chest with intravenous contrast (if not contraindicated) in 1-3 months to assess for resolution of the above. 5. Splenomegaly. 6. Trace pericardial effusion. 7. Extensive coronary artery calcifications and aortic Atherosclerosis (ICD10-I70.0). Electronically Signed   By: Valetta Mole M.D.   On: 06/27/2021 16:06   MR BRAIN WO CONTRAST  Result Date: 06/27/2021 CLINICAL DATA:  Initial evaluation for altered mental status, neuro deficit, stroke suspected. EXAM: MRI HEAD WITHOUT CONTRAST TECHNIQUE: Multiplanar, multiecho pulse sequences of the brain and surrounding structures were obtained without intravenous contrast. COMPARISON:  CT from 06/26/2021 as well as previous exams from 05/10/2021 and 05/09/2021. FINDINGS: Brain: Examination moderately degraded by motion artifact, limiting assessment. Diffuse prominence of the CSF containing spaces compatible with generalized age-related cerebral atrophy. Patchy T2/FLAIR hyperintensity about the periventricular and deep white matter both cerebral hemispheres most consistent with chronic microvascular ischemic disease, moderate in nature, and grossly stable from previous. Superimposed small remote left thalamic lacunar infarct with chronic hemosiderin staining. Scattered remote bilateral cerebellar infarcts again seen, right  greater than left. Associated susceptibility artifact consistent with dystrophic calcification and/or chronic blood products as seen on prior studies, stable. There is a 2 cm focus of FLAIR and T1 hyperintensity involving the anterior/inferior aspect of the right frontal lobe (series 4, image 17), new as compared to prior MRI. In comparison with prior CT, there appears to be a subtle small extra-axial hemorrhage and probable parenchymal contusion at this location (series 5, image 16 on prior CT). This is suspected to be subacute in nature. No significant regional mass effect or edema. No other evidence for acute or subacute infarct. Gray-white matter differentiation otherwise maintained. No other evidence for acute or chronic intracranial hemorrhage. No mass lesion, midline shift or mass effect. No hydrocephalus. No  other visible extra-axial fluid collection. Pituitary gland suprasellar region grossly normal. Midline structures grossly intact. Vascular: Major intracranial vascular flow voids are grossly maintained. Skull and upper cervical spine: Craniocervical junction grossly within normal limits. Bone marrow signal intensity diffusely heterogeneous. No visible focal marrow replacing lesion seen on this motion degraded exam. No visible scalp soft tissue abnormality. Sinuses/Orbits: Globes and orbital soft tissues demonstrate no acute finding. Extensive pan sinusitis with large bilateral mastoid effusions noted. Inner ear structures grossly normal. Other: None. IMPRESSION: 1. Motion degraded exam. 2. 2 cm focus of FLAIR and T1 hyperintensity involving the anterior/inferior right frontal lobe, new as compared to recent MRI from 05/09/2021. In comparison with prior CT from 06/26/2021, there appears to be a small extra-axial hemorrhage and probable parenchymal contusion at this location. This is likely subacute in nature without significant regional mass effect or edema. Correlation with history for possible recent  trauma suggested. Appearance would be atypical and unusual for intracranial spread of infection from adjacent paranasal sinus disease, which would be the primary differential consideration. Short interval follow-up MRI in 2-3 months to ensure these changes resolve is suggested. If there is concern for possible intracranial infection, then correlation with dedicated LP and CSF studies could be performed as warranted. 3. No other acute intracranial abnormality. 4. 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 5. Extensive pan sinusitis with large bilateral mastoid effusions. Results were communicated by telephone at the time of interpretation on 06/27/2021 at 2:32 am to provider JARED GARDNER. Electronically Signed   By: Jeannine Boga M.D.   On: 06/27/2021 02:45   DG Swallowing Func-Speech Pathology  Result Date: 07/06/2021 Table formatting from the original result was not included. Objective Swallowing Evaluation: Type of Study: MBS-Modified Barium Swallow Study  Patient Details Name: Christine Gibson MRN: [id number] Date of Birth: [birth year] Today's Date: 06/28/2021 Time: SLP Start Time (ACUTE ONLY): 1648 -SLP Stop Time (ACUTE ONLY): 1705 SLP Time Calculation (min) (ACUTE ONLY): 17 min Past Medical History: Past Medical History: Diagnosis Date  Chronic kidney disease   Diabetes mellitus without complication ([place])   ESRD (end stage renal disease) ([place])   Clinician notes  History of leukemia   Hypertension  Past Surgical History: Past Surgical History: Procedure Laterality Date  ABDOMINAL HYSTERECTOMY  1991  partial  CESAREAN SECTION    x 3 HPI: Patient is a 70 y.o. female ESRD on HD TTS, CLL, DM-2, HTN, HLD, recent CVA in July (passed [place] seen by SLP for cognition)-presented to the hospital with confusion and low-grade fever. Pt found to have had a fall, and also COVID. MRI brain: Anterior/inferior right frontal lobe-likely contusion. CT  chest shows Debris in the left lower lobe bronchus with complete collapse of the left lower lobe, and near complete collapse of the right middle lobe likely due to aspiration with mucous plugging. Patchy opacities in the aerated right middle lobe and right apex also favored to reflect aspiration. Again passed [place] this admission.  Subjective: "I just don't feel good" Assessment / Plan / Recommendation CHL IP CLINICAL IMPRESSIONS 06/28/2021 Clinical Impression -- Pt demonstrates a mild dysphagia; with liquids there is no impairment, pt does trigger swallow with bolus spilling the pharynx, but airway prtoection is adequate. However, when masticating solids pt is distratcted and inattentive. There is a prolonged period after transit to pharynx, up to a minute with verbal cues given, before pt swallows pooled solids. Overall, pts cognition is impaired, impacting swallowing, though this does  appear to be improving pt would benefit from softer solids in the meantime to prevent solid food aspiration. Will downgrade to dys 2/thin liquids and f/u for upgrade as pt demonstrate improved attention to meals. SLP Visit Diagnosis Dysphagia, unspecified (R13.10) Attention and concentration deficit following -- Frontal lobe and executive function deficit following -- Impact on safety and function --   CHL IP TREATMENT RECOMMENDATION 06/28/2021 Treatment Recommendations Therapy as outlined in treatment plan below   Prognosis 06/28/2021 Prognosis for Safe Diet Advancement Good Barriers to Reach Goals Cognitive deficits Barriers/Prognosis Comment -- CHL IP DIET RECOMMENDATION 06/30/2021 SLP Diet Recommendations -- Liquid Administration via -- Medication Administration -- Compensations Minimize environmental distractions;Slow rate;Small sips/bites Postural Changes --   CHL IP OTHER RECOMMENDATIONS 06/28/2021 Recommended Consults -- Oral Care Recommendations Oral care BID Other Recommendations --   CHL IP FOLLOW UP RECOMMENDATIONS 06/30/2021 Follow  up Recommendations (No Data)   CHL IP FREQUENCY AND DURATION 06/28/2021 Speech Therapy Frequency (ACUTE ONLY) min 2x/week Treatment Duration 2 weeks      CHL IP ORAL PHASE 06/28/2021 Oral Phase Impaired Oral - Pudding Teaspoon -- Oral - Pudding Cup -- Oral - Honey Teaspoon -- Oral - Honey Cup -- Oral - Nectar Teaspoon -- Oral - Nectar Cup -- Oral - Nectar Straw -- Oral - Thin Teaspoon -- Oral - Thin Cup WFL Oral - Thin Straw WFL Oral - Puree WFL Oral - Mech Soft -- Oral - Regular Premature spillage;Decreased bolus cohesion;Delayed oral transit Oral - Multi-Consistency -- Oral - Pill -- Oral Phase - Comment --  CHL IP PHARYNGEAL PHASE 06/28/2021 Pharyngeal Phase WFL Pharyngeal- Pudding Teaspoon -- Pharyngeal -- Pharyngeal- Pudding Cup -- Pharyngeal -- Pharyngeal- Honey Teaspoon -- Pharyngeal -- Pharyngeal- Honey Cup -- Pharyngeal -- Pharyngeal- Nectar Teaspoon -- Pharyngeal -- Pharyngeal- Nectar Cup -- Pharyngeal -- Pharyngeal- Nectar Straw -- Pharyngeal -- Pharyngeal- Thin Teaspoon -- Pharyngeal -- Pharyngeal- Thin Cup -- Pharyngeal -- Pharyngeal- Thin Straw -- Pharyngeal -- Pharyngeal- Puree -- Pharyngeal -- Pharyngeal- Mechanical Soft -- Pharyngeal -- Pharyngeal- Regular -- Pharyngeal -- Pharyngeal- Multi-consistency -- Pharyngeal -- Pharyngeal- Pill -- Pharyngeal -- Pharyngeal Comment --  No flowsheet data found. Herbie Baltimore, MA CCC-SLP Acute Rehabilitation Services Office [phone] Lynann Beaver 07/06/2021, 8:19 AM              EEG adult  Result Date: 06/30/2021 Lora Havens, MD     06/30/2021  9:44 AM Patient Name: Christine Gibson MRN: [id number] Epilepsy Attending: Lora Havens Referring Physician/Provider: Dr Harrold Donath Date: 06/29/2021 Duration: 23.16 mins Patient history: 70 yo female who is chronically ill with ESRD and CLL, multiple other comorbidities as listed in her PMHx, presenting 2 days ago with low grade fever and confusion. She had a seizure yesterday with no previously  known history of seizures. Her risk factors for seizures are prior CVA and contusion on MRIb. EEG to evaluate for seizure. Level of alertness: Awake AEDs during EEG study: LEV Technical aspects: This EEG study was done with scalp electrodes positioned according to the 10-20 International system of electrode placement. Electrical activity was acquired at a sampling rate of 500Hz  and reviewed with a high frequency filter of 70Hz  and a low frequency filter of 1Hz . EEG data were recorded continuously and digitally stored. Description: EEG showed continuous generalized 3 to 6 Hz theta-delta slowing. Generalized periodic discharges with triphasic morphology at 1.5-2 Hz. Hyperventilation and photic stimulation were not performed.   ABNORMALITY - Periodic discharges with triphasic morphology, generalized (  GPDs) - Continuous slow, generalized IMPRESSION: This study showed generalized periodic discharges at 1.5 to 2 Hz which is on the ictal-interictal continuum with high potential for seizures.  Additionally there is moderate diffuse encephalopathy, nonspecific etiology but could be secondary to cefepime toxicity, renal dysfunction, hepatic dysfunction, hyperammonemia.  No seizures were seen throughout the recording. If suspicion for ictal-interictal activity persists, please consider long-term EEG monitoring. Lora Havens   EEG adult  Result Date: 06/28/2021 Lora Havens, MD     06/28/2021 11:04 AM Patient Name: Christine Gibson MRN: [id number] Epilepsy Attending: Lora Havens Referring Physician/Provider: Dr Oren Binet Date: 06/27/2021 Duration: 24.19 mins Patient history: 70 year old female with acute/subacute right frontal lobe contusion and altered mental status.  EEG evaluate for seizures. Level of alertness: Awake AEDs during EEG study: None Technical aspects: This EEG study was done with scalp electrodes positioned according to the 10-20 International system of electrode placement. Electrical activity  was acquired at a sampling rate of 500Hz  and reviewed with a high frequency filter of 70Hz  and a low frequency filter of 1Hz . EEG data were recorded continuously and digitally stored. Description: No clear posterior dominant rhythm was seen.  EEG showed continuous generalized 3 to 5 Hz theta and delta slowing. Hyperventilation and photic stimulation were not performed.   ABNORMALITY - Continuous slow, generalized IMPRESSION: This study is suggestive of moderate diffuse encephalopathy, nonspecific etiology. No seizures or epileptiform discharges were seen throughout the recording. Priyanka Barbra Sarks   Overnight EEG with video  Result Date: 07/01/2021 Lora Havens, MD     07/02/2021  9:51 AM Patient Name: Christine Gibson MRN: [id number] Epilepsy Attending: Lora Havens Referring Physician/Provider: Clance Boll, NP Duration: 06/30/2021 1156 to 07/01/2021 1156  Patient history: 70 yo female who is chronically ill with ESRD and CLL, multiple other comorbidities as listed in her PMHx, presenting 2 days ago with low grade fever and confusion. She had a seizure yesterday with no previously known history of seizures. Her risk factors for seizures are prior CVA and contusion on MRIb. EEG to evaluate for seizure.  Level of alertness: Awake  AEDs during EEG study: LEV  Technical aspects: This EEG study was done with scalp electrodes positioned according to the 10-20 International system of electrode placement. Electrical activity was acquired at a sampling rate of 500Hz  and reviewed with a high frequency filter of 70Hz  and a low frequency filter of 1Hz . EEG data were recorded continuously and digitally stored.  Description: EEG showed continuous generalized 3 to 6 Hz theta-delta slowing. Generalized periodic discharges with triphasic morphology at 1.5-2 Hz were noted predominantly when awake/stimulated. These gradually improved in frequency to 0.5-1hz .  Hyperventilation and photic stimulation were not performed.     ABNORMALITY - Periodic discharges with triphasic morphology, generalized ( GPDs) - Continuous slow, generalized  IMPRESSION: This study showed generalized periodic discharges which were initially at at 1.5 to 2 Hz and gradually improved to 0.5-1hz , predominantly when awake/stimulated. The morphology of discharges, reactivity to stimulation and improvement after discontinuing cefepime is most likely indicative of toxic-metabolic etiology.  Additionally there is moderate diffuse encephalopathy, nonspecific etiology but most likely secondary to cefepime toxicity, renal dysfunction.  No seizures were seen throughout the recording. EEG appears to be improving compared to previous day.  Priyanka Barbra Sarks     TODAY-DAY OF DISCHARGE:  Subjective:   Shasta Chinn today has no headache,no chest abdominal pain,no new weakness tingling or numbness, feels much better wants to go home  today.  Objective:   Blood pressure (!) 141/74, pulse 98, temperature 98.6 F (37 C), temperature source Axillary, resp. rate (!) 24, height 4\' 11"  (1.499 m), weight 39 kg, SpO2 92 %. No intake or output data in the 24 hours ending 07/20/21 1048 Filed Weights   07/18/21 1120 07/19/21 0700 07/20/21 0500  Weight: 38.2 kg 39.1 kg 39 kg    Exam: Awake Alert, Oriented *3, No new F.N deficits, Normal affect [place].AT,PERRAL Supple Neck,No JVD, No cervical lymphadenopathy appriciated.  Symmetrical Chest wall movement, Good air movement bilaterally, CTAB RRR,No Gallops,Rubs or new Murmurs, No Parasternal Heave +ve B.Sounds, Abd Soft, Non tender, No organomegaly appriciated, No rebound -guarding or rigidity. No Cyanosis, Clubbing or edema, No new Rash or bruise   PERTINENT RADIOLOGIC STUDIES: No results found.   PERTINENT LAB RESULTS: CBC: Recent Labs    07/18/21 0826  WBC 99.5*  HGB 11.3*  HCT 34.9*  PLT 61*   CMET CMP     Component Value Date/Time   NA 131 (L) 07/20/2021 0824   K 5.0 07/20/2021 0824   CL 94 (L)  07/20/2021 0824   CO2 24 07/20/2021 0824   GLUCOSE 133 (H) 07/20/2021 0824   BUN 46 (H) 07/20/2021 0824   CREATININE 5.90 (H) 07/20/2021 0824   CREATININE 4.72 (H) 01/06/2021 1615   CALCIUM 8.5 (L) 07/20/2021 0824   PROT 4.8 (L) 06/27/2021 0816   ALBUMIN 2.4 (L) 07/20/2021 0824   AST 21 06/27/2021 0816   ALT 12 06/27/2021 0816   ALKPHOS 50 06/27/2021 0816   BILITOT 0.7 06/27/2021 0816   GFRNONAA 7 (L) 07/20/2021 0824   GFRAA 12 (L) 01/12/2019 0908    GFR Estimated Creatinine Clearance: 5.4 mL/min (A) (by C-G formula based on SCr of 5.9 mg/dL (H)). No results for input(s): LIPASE, AMYLASE in the last 72 hours. No results for input(s): CKTOTAL, CKMB, CKMBINDEX, TROPONINI in the last 72 hours. Invalid input(s): POCBNP No results for input(s): DDIMER in the last 72 hours. No results for input(s): HGBA1C in the last 72 hours. No results for input(s): CHOL, HDL, LDLCALC, TRIG, CHOLHDL, LDLDIRECT in the last 72 hours. No results for input(s): TSH, T4TOTAL, T3FREE, THYROIDAB in the last 72 hours.  Invalid input(s): FREET3 No results for input(s): VITAMINB12, FOLATE, FERRITIN, TIBC, IRON, RETICCTPCT in the last 72 hours. Coags: No results for input(s): INR in the last 72 hours.  Invalid input(s): PT Microbiology: No results found for this or any previous visit (from the past 240 hour(s)).  FURTHER DISCHARGE INSTRUCTIONS:  Get Medicines reviewed and adjusted: Please take all your medications with you for your next visit with your Primary MD  Laboratory/radiological data: Please request your Primary MD to go over all hospital tests and procedure/radiological results at the follow up, please ask your Primary MD to get all Hospital records sent to his/her office.  In some cases, they will be blood work, cultures and biopsy results pending at the time of your discharge. Please request that your primary care M.D. goes through all the records of your hospital data and follows up on these  results.  Also Note the following: If you experience worsening of your admission symptoms, develop shortness of breath, life threatening emergency, suicidal or homicidal thoughts you must seek medical attention immediately by calling 911 or calling your MD immediately  if symptoms less severe.  You must read complete instructions/literature along with all the possible adverse reactions/side effects for all the Medicines you take and that have been  prescribed to you. Take any new Medicines after you have completely understood and accpet all the possible adverse reactions/side effects.   Do not drive when taking Pain medications or sleeping medications (Benzodaizepines)  Do not take more than prescribed Pain, Sleep and Anxiety Medications. It is not advisable to combine anxiety,sleep and pain medications without talking with your primary care practitioner  Special Instructions: If you have smoked or chewed Tobacco  in the last 2 yrs please stop smoking, stop any regular Alcohol  and or any Recreational drug use.  Wear Seat belts while driving.  Please note: You were cared for by a hospitalist during your hospital stay. Once you are discharged, your primary care physician will handle any further medical issues. Please note that NO REFILLS for any discharge medications will be authorized once you are discharged, as it is imperative that you return to your primary care physician (or establish a relationship with a primary care physician if you do not have one) for your post hospital discharge needs so that they can reassess your need for medications and monitor your lab values.  Total Time spent coordinating discharge including counseling, education and face to face time equals 35 minutes.  SignedOren Binet 07/20/2021 10:48 AM

## 2021-07-20 NOTE — Consult Note (Signed)
   [facility] CM Inpatient Consult   07/20/2021  Christine Gibson 1951-04-14 [id number]  Follow up:  [facility] Medicare  Chart reviewed for LLOS and disposition needs.  Patient's disposition remains to a skilled nursing facility and barriers for transition.  Patient's primary care is an [facility] Chronic Care Management provider.   No current [facility] Care Management needs for post hospital follow up.  Natividad Brood, RN BSN [facility] Liaison  [phone] business mobile phone Toll free office [phone]  Fax number: [phone] [email or web address]

## 2021-07-20 NOTE — Progress Notes (Addendum)
Contacted by CSW that pt has been approved by insurance for snf. Pt receiving HD in chair today and tolerating well per staff. Contacted [place] and spoke to [place], Therapist, sports. Pt receives HD on TTS. Pt will need to arrive at 11:00 with chair time of 11:15. Clinic has hoyer lift if needed but snf will need to send hoyer sling under pt on HD days. PT has been working with pt for transfers. Above info provided to [place].   Melven Sartorius  Renal Navigator [phone]  Addendum at 4:00 pm: Contacted [facility] to confirm pt's d/c for today and pt to resume out-pt schedule on Saturday. Clinic agreeable to plan.

## 2021-07-20 NOTE — TOC Transition Note (Signed)
Transition of Care [facility]) - CM/SW Discharge Note   Patient Details  Name: [name] MRN: [id number] Date of Birth: [birth year]  [facility] CM/SW Contact:  Benard Halsted, LCSW Phone Number: 07/20/2021, 3:10 PM   Clinical Narrative:    Patient will DC to: [facility] Anticipated DC date: 07/20/21 Family notified: Spouse Transport by: Corey Harold   Per MD patient ready for DC to [facility]. RN to call report prior to discharge [phone] Room 109). RN, patient, patient's family, and facility notified of DC. Discharge Summary and FL2 sent to facility. DC packet on chart. Ambulance transport requested for patient.   CSW will sign off for now as social work intervention is no longer needed. Please consult [place] again if new needs arise.     Final next level of care: Skilled Nursing Facility Barriers to Discharge: Barriers Resolved   Patient Goals and CMS Choice Patient states their goals for this hospitalization and ongoing recovery are:: Rehab CMS Medicare.gov Compare Post Acute Care list provided to:: Patient Represenative (must comment) Choice offered to / list presented to : Spouse  Discharge Placement   Existing PASRR number confirmed : 07/20/21          Patient chooses bed at: [facility] Patient to be transferred to facility by: [place] Name of family member notified: Spouse Patient and family notified of of transfer: 07/20/21  Discharge Plan and Services In-house Referral: Clinical Social Work, Hospice / Palliative Care   Post Acute Care Choice: Hospice                               Social Determinants of Health (SDOH) Interventions     Readmission Risk Interventions No flowsheet data found.

## 2021-07-20 NOTE — TOC Progression Note (Addendum)
Transition of Care [facility]) - Progression Note    Patient Details  Name: Christine Gibson MRN: [id number] Date of Birth: [birth year]  Transition of Care [facility]) CM/SW [place], LCSW Phone Number: 07/20/2021, 11:51 AM  Clinical Narrative:    11am-[facility] has received approval from [place]. CSW updated patient's spouse. He stated that he thought the [place] would pay for [facility]. CSW explained that [facility] is not on the [facility]'s list of approved facilities unfortunately so this would only be for short term rehab. Patient's spouse did not like the VA contracted SNF in [place], and [place] denied patient. These would have been the closest facilities but he reported that it is difficult for him to drive any further. CSW explained that he would have to decide once patient is at [facility] whether he wants patient to be in long term care if she does not improve and if so, to decide whether he wants her to be in a facility that is closer like [facility] versus one that he liked better in [place]. Spouse stated understanding though he is tired. He stated patient used [facility] transportation to go to dialysis appointments.   CSW contacted [place] with the VA to see if they are able to transport patient to dialysis. She stated they had the patient approved at a dialysis center in [place] so she would need to get approval first.   CSW contacted [employer] [employer] and they stated that patient would need to apply for [facility] [place] since [facility] is in the city limits. CSW will complete application and see if [facility] can transport patient until [place] is approved.   12pm-CSW received email from [place] with the [place] and she stated patient is approved for dialysis and she will work on scheduling transportation for Saturday. CSW thus did not complete [facility] [place] application.  CSW made Tim with [facility] aware that patient will be going to SNF rehab and they will need to revoke services if they  have not already.    CSW requested Tye Maryland with the [place] continue to help patient with a VA bed if spouse chooses once Medicare rehab stay is done.  4pm-CSW notified by Tye Maryland at the [facility] that they are now not able to transport patient since it is out of their service area and not at a VA-contracted facility.   CSW contacted discussed with [facility] Supervisor and contacted [facility]; pickups scheduled for 10/1, 10/4, 10/6. CSW completing [facility] [place] application to transport patient after that time.    Expected Discharge Plan: Skilled Nursing Facility Barriers to Discharge:  (needs to sit for op HD)  Expected Discharge Plan and Services Expected Discharge Plan: [place] In-house Referral: Clinical Social Work, Hospice / [place] Acute Care Choice: Hospice Living arrangements for the past 2 months: Single Family Home Expected Discharge Date: 07/20/21                                     Social Determinants of Health (SDOH) Interventions    Readmission Risk Interventions No flowsheet data found.

## 2021-07-20 NOTE — Progress Notes (Signed)
  [facility] Progress Note   Subjective:  Seen on HD - in recliner. BP holding for now, no UF goal. Confused and frail, but looks comfortable at the moment.  Objective Vitals:   07/20/21 0804 07/20/21 0817 07/20/21 0830 07/20/21 0900  BP: (!) 142/95 (!) 155/78 137/69 140/76  Pulse: (!) 101 90 86 92  Resp: (!) 27 (!) 31 17 19   Temp: 98.6 F (37 C)     TempSrc: Axillary     SpO2: 92%     Weight:      Height:       Physical Exam General: Chronically ill and frail appearing woman, NAD Heart: RRR; 2/6 murmur Lungs: CTA anteriorly Abdomen: soft Extremities: No LE edema Dialysis Access: LUE AVF + thrill  Additional Objective Labs: Basic Metabolic Panel: Recent Labs  Lab 07/18/21 0819 07/20/21 0824  NA 126* 131*  K 5.7* 5.0  CL 92* 94*  CO2 20* 24  GLUCOSE 110* 133*  BUN 64* 46*  CREATININE 7.49* 5.90*  CALCIUM 8.4* 8.5*  PHOS 6.8* 5.7*   Liver Function Tests: Recent Labs  Lab 07/18/21 0819 07/20/21 0824  ALBUMIN 2.3* 2.4*   CBC: Recent Labs  Lab 07/18/21 0826  WBC 99.5*  HGB 11.3*  HCT 34.9*  MCV 81.7  PLT 61*   Medications:   amLODipine  10 mg Oral Daily   carvedilol  12.5 mg Oral BID WC   chlorhexidine  15 mL Mouth Rinse BID   Chlorhexidine Gluconate Cloth  6 each Topical Q0600   doxercalciferol  2 mcg Intravenous Q T,Th,Sa-HD   hydrALAZINE  50 mg Oral Q8H   insulin aspart  0-6 Units Subcutaneous TID WC   levETIRAcetam  500 mg Oral Daily   And   levETIRAcetam  250 mg Oral Q T,Th,Sat-1800   mouth rinse  15 mL Mouth Rinse q12n4p   nystatin  5 mL Oral QID   Zinc Oxide   Topical TID    Dialysis Orders: [facility] 3.5h  41.5kg  400/500  3K/2.5 bath L AVG  no heparin  - Hectoral 41mcg IV q HD  - Mircera 200 q2 last 8/30  Assessment/Plan: Aspiration PNA - RML s/p course of IV Unasyn. AMS - multifactorial with recent CVA, contusion, seizures, COVID-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  She has a poor prognosis, waxing/waning mental  status.  Nephrology has charted support for transition to comfort care/ hospice.  Patient husband adamantly refusing hospice due to negative experience with 1st wife per notes.  ESRD: Continue HD per TTS schedule - HD now. BP dropping q HD - follow. Chronic COVID infection/FTT: pt was COVID + first during May 2022 admit, has been testing + repeatedly since then. Negative testing for COVID antibodies per charting. Off isolation precautions. Acute/ subactue R frontal lobe contusion: per Neurosurgery - no surg needed CLL/ chronic thrombocytopenia/severe leukocytosis - f/u [facility]. Last seen in June 2022 by [facility] and plans were to stop zanubrutinib and initiate hospice care.  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 No ESA indicated. Secondary HPTH: Ca ok, Phos a little high - may need to resume binder. HTN/ volume: BP stable, but then drops with HD. No signs of overload. Remains on Coreg/hydral/amlodipine - may need to reduce dose. FTT/Dispo: Clearly declining. Husband has refused hospice. Plan to SNF once stable. Dialyzing in recliner today - tolerating so far.    Veneta Penton, PA-C 07/20/2021, 9:04 AM  [employer]

## 2021-07-21 ENCOUNTER — Other Ambulatory Visit: Payer: Self-pay

## 2021-07-21 ENCOUNTER — Non-Acute Institutional Stay: Payer: Medicare PPO | Admitting: Hospice

## 2021-07-21 DIAGNOSIS — Z515 Encounter for palliative care: Secondary | ICD-10-CM

## 2021-07-21 DIAGNOSIS — E43 Unspecified severe protein-calorie malnutrition: Secondary | ICD-10-CM | POA: Diagnosis not present

## 2021-07-21 DIAGNOSIS — N186 End stage renal disease: Secondary | ICD-10-CM | POA: Diagnosis not present

## 2021-07-21 DIAGNOSIS — Z992 Dependence on renal dialysis: Secondary | ICD-10-CM | POA: Diagnosis not present

## 2021-07-21 DIAGNOSIS — R531 Weakness: Secondary | ICD-10-CM

## 2021-07-21 LAB — HEPATITIS B SURFACE ANTIBODY, QUANTITATIVE: Hep B S AB Quant (Post): 44 m[IU]/mL (ref >9.9)

## 2021-07-21 LAB — HEPATITIS B E ANTIBODY: Hep B E Ab: NEGATIVE

## 2021-07-21 NOTE — Progress Notes (Signed)
[facility] [facility] Palliative Care Consult Note Telephone: [phone]  Fax: [phone]  PATIENT NAME: Christine Gibson DOB: 07-Feb-1951 MRN: [id number]  PRIMARY CARE PROVIDER:   Michela Pitcher, NP Michela Pitcher, NP [place],  [place] [address]  REFERRING PROVIDER: Dr. Garwin Brothers  RESPONSIBLE PARTY:  Self Contact Information     Name Relation Home Work Mobile   Cerrito,[name] Spouse [phone]         Visit is to build trust and highlight Palliative Medicine as specialized medical care for people living with serious illness, aimed at facilitating better quality of life through symptoms relief, assisting with advance care planning and complex medical decision making.  NP called [name] and updated him on visit.  This is a follow up visit.  RECOMMENDATIONS/PLAN:   Advance Care Planning/Code Status:Patient is a Do Not Resuscitate.   Goals of Care: Goals of care include to maximize quality of life and symptom management.  Patient was recently discharged from [facility], now in SNF for ongoing care.  Visit consisted of counseling and education dealing with the complex and emotionally intense issues of symptom management and palliative care in the setting of serious and potentially life-threatening illness.  [name] reports he has been distressed by patient's ongoing decline.  Therapeutic listening and ample emotional support provided.  Palliative care team will continue to support patient, patient's family, and medical team.  Symptom management/Plan:  Severe protein caloric malnutrition: Ongoing weight loss currently at 88 pounds, down from 101 pounds 2 months ago.  Patient would benefit from ST consult.  Initiate Remeron 7.5 mg at bedtime to help boost appetite. ESRD: Continue dialysis TTS Weakness: PT OT in process. Follow up: Palliative care will continue to follow for complex medical decision making, advance care planning, and  clarification of goals. Return 6 weeks or prn. Encouraged to call provider sooner with any concerns.  CHIEF COMPLAINT: Palliative follow up  HISTORY OF PRESENT ILLNESS:  Christine Gibson a 70 y.o. female with multiple medical problems including protein caloric malnutrition, worsening fatigue. Recently admitted on 05/09/2021 and discharged on 05/19/2021 for acute CVA; hx of ESRD, Leukemia. Patient denies pain/discomfort, endorses poor appetite and fatigue.  Nursing with no concerns at this time. History obtained from review of EMR, discussion with primary team, family and/or patient. Records reviewed and summarized above. All 10 point systems reviewed and are negative except as documented in history of present illness above  Review and summarization of Epic records shows history from other than patient.   Palliative Care was asked to follow this patient o help address complex decision making in the context of advance care planning and goals of care clarification.   PHYSICAL EXAM  Height/weight: 5 feet 11 inches / 88 pounds. General: In no acute distress, appropriately dressed Cardiovascular: regular rate and rhythm; no edema in BLE Pulmonary: no cough, no increased work of breathing, normal respiratory effort Abdomen: soft, non tender, no guarding, positive bowel sounds in all quadrants GU:  no suprapubic tenderness Eyes: Normal lids, no discharge ENMT: Moist mucous membranes Musculoskeletal:  weakness, sarcopenia Skin: no rash to visible skin, warm without cyanosis,  Psych: non-anxious affect Neurological: Weakness but otherwise non focal Heme/lymph/immuno: no bruises, no bleeding  PERTINENT MEDICATIONS:  Outpatient Encounter Medications as of 07/21/2021  Medication Sig   acetaminophen (TYLENOL) 325 MG tablet Take 650 mg by mouth every 4 (four) hours as needed for mild pain or fever.   albuterol (VENTOLIN HFA)  108 (90 Base) MCG/ACT inhaler Inhale 1 puff into the lungs every 6 (six) hours as  needed for wheezing or shortness of breath.   amLODipine (NORVASC) 5 MG tablet Take 2 tablets (10 mg total) by mouth daily.   atorvastatin (LIPITOR) 40 MG tablet Take 40 mg by mouth daily.   carvedilol (COREG) 25 MG tablet Take 25 mg by mouth 2 (two) times daily with a meal.   hydrALAZINE (APRESOLINE) 50 MG tablet Take 1 tablet (50 mg total) by mouth every 8 (eight) hours.   hydrocortisone-pramoxine (ANALPRAM-HC) 2.5-1 % rectal cream Place 1 application rectally 3 (three) times daily.   insulin aspart (NOVOLOG) 100 UNIT/ML injection 0-6 Units, Subcutaneous, 3 times daily with meals CBG < 70: Implement Hypoglycemia measures CBG 70 - 120: 0 units CBG 121 - 150: 0 units CBG 151 - 200: 1 unit CBG 201-250: 2 units CBG 251-300: 3 units CBG 301-350: 4 units CBG 351-400: 5 units CBG > 400: Give 6 units and call MD   levETIRAcetam (KEPPRA) 250 MG tablet Take 1 tablet (250 mg total) by mouth every Tuesday, Thursday, and Saturday at 6 PM.   levETIRAcetam (KEPPRA) 500 MG tablet Take 1 tablet (500 mg total) by mouth daily.   lidocaine (XYLOCAINE) 5 % ointment Apply 1 application topically 2 (two) times daily as needed for moderate pain (rectal pain).   loratadine (CLARITIN) 10 MG tablet Take 10 mg by mouth daily.   melatonin 3 MG TABS tablet Take 3 mg by mouth daily as needed (sleep).   mupirocin nasal ointment (BACTROBAN NASAL) 2 % Place 1 application into the nose 2 (two) times daily. Apply to sore on nostril twice daily (Patient not taking: No sig reported)   oxyCODONE (OXY IR/ROXICODONE) 5 MG immediate release tablet Take 1 tablet (5 mg total) by mouth every 6 (six) hours as needed.   pantoprazole (PROTONIX) 40 MG tablet Take 1 tablet (40 mg total) by mouth 2 (two) times daily.   No facility-administered encounter medications on file as of 07/21/2021.    HOSPICE ELIGIBILITY/DIAGNOSIS: TBD  PAST MEDICAL HISTORY:  Past Medical History:  Diagnosis Date   Chronic kidney disease    Diabetes mellitus  without complication ([place])    ESRD (end stage renal disease) ([place])    Clinician notes   History of leukemia    Hypertension      Review lab tests/diagnostics Recent Labs  Lab 07/18/21 0826  WBC 99.5*  HGB 11.3*  HCT 34.9*  PLT 61*  MCV 81.7   Recent Labs  Lab 07/18/21 0819 07/20/21 0824  NA 126* 131*  K 5.7* 5.0  CL 92* 94*  CO2 20* 24  BUN 64* 46*  CREATININE 7.49* 5.90*  GLUCOSE 110* 133*   Estimated Creatinine Clearance: 5.4 mL/min (A) (by C-G formula based on SCr of 5.9 mg/dL (H)).  ALLERGIES:  Allergies  Allergen Reactions   Lisinopril Cough      I spent 50 minutes providing this consultation; this includes time spent with patient/family, chart review and documentation. More than 50% of the time in this consultation was spent on counseling and coordinating communication   Thank you for the opportunity to participate in the care of Christine Gibson Please call our office at [phone] if we can be of additional assistance.  Note: Portions of this note were generated with [employer]. Dictation errors may occur despite best attempts at proofreading.  Teodoro Spray, NP

## 2021-07-21 NOTE — Progress Notes (Signed)
Patient picked up by [facility] and transported via stretcher. Patient was discharged with gown as patient had no clothes available.  Hearing aids (x2) was given to transporters in a black box along with discharge paperwork.  Patient had no other belongings at bedside. Husband called to notify him of patient discharge.

## 2021-07-22 DIAGNOSIS — Q612 Polycystic kidney, adult type: Secondary | ICD-10-CM | POA: Diagnosis not present

## 2021-07-22 DIAGNOSIS — N186 End stage renal disease: Secondary | ICD-10-CM | POA: Diagnosis not present

## 2021-07-22 DIAGNOSIS — Z992 Dependence on renal dialysis: Secondary | ICD-10-CM | POA: Diagnosis not present

## 2021-07-26 DIAGNOSIS — I69398 Other sequelae of cerebral infarction: Secondary | ICD-10-CM | POA: Diagnosis not present

## 2021-07-26 DIAGNOSIS — R4189 Other symptoms and signs involving cognitive functions and awareness: Secondary | ICD-10-CM | POA: Diagnosis not present

## 2021-07-26 DIAGNOSIS — G8929 Other chronic pain: Secondary | ICD-10-CM | POA: Diagnosis not present

## 2021-07-26 DIAGNOSIS — M6281 Muscle weakness (generalized): Secondary | ICD-10-CM | POA: Diagnosis not present

## 2021-07-27 ENCOUNTER — Other Ambulatory Visit: Payer: Self-pay

## 2021-07-27 ENCOUNTER — Emergency Department (HOSPITAL_COMMUNITY): Payer: Medicare HMO

## 2021-07-27 ENCOUNTER — Inpatient Hospital Stay (HOSPITAL_COMMUNITY)
Admission: EM | Admit: 2021-07-27 | Discharge: 2021-07-31 | DRG: 291 | Disposition: A | Discharge status: Hospice | Payer: Medicare HMO | Source: Ambulatory Visit | Attending: Internal Medicine | Admitting: Internal Medicine

## 2021-07-27 ENCOUNTER — Inpatient Hospital Stay (HOSPITAL_COMMUNITY): Payer: Medicare HMO

## 2021-07-27 ENCOUNTER — Encounter (HOSPITAL_COMMUNITY): Payer: Self-pay

## 2021-07-27 DIAGNOSIS — R54 Age-related physical debility: Secondary | ICD-10-CM | POA: Diagnosis present

## 2021-07-27 DIAGNOSIS — R000 Tachycardia, unspecified: Secondary | ICD-10-CM | POA: Diagnosis not present

## 2021-07-27 DIAGNOSIS — D8481 Immunodeficiency due to conditions classified elsewhere: Secondary | ICD-10-CM | POA: Diagnosis present

## 2021-07-27 DIAGNOSIS — R072 Precordial pain: Secondary | ICD-10-CM

## 2021-07-27 DIAGNOSIS — J811 Chronic pulmonary edema: Secondary | ICD-10-CM | POA: Diagnosis not present

## 2021-07-27 DIAGNOSIS — E1122 Type 2 diabetes mellitus with diabetic chronic kidney disease: Secondary | ICD-10-CM | POA: Diagnosis present

## 2021-07-27 DIAGNOSIS — G40909 Epilepsy, unspecified, not intractable, without status epilepticus: Secondary | ICD-10-CM | POA: Diagnosis present

## 2021-07-27 DIAGNOSIS — Z8349 Family history of other endocrine, nutritional and metabolic diseases: Secondary | ICD-10-CM

## 2021-07-27 DIAGNOSIS — Z20822 Contact with and (suspected) exposure to covid-19: Secondary | ICD-10-CM | POA: Diagnosis not present

## 2021-07-27 DIAGNOSIS — Z8616 Personal history of COVID-19: Secondary | ICD-10-CM

## 2021-07-27 DIAGNOSIS — R0789 Other chest pain: Secondary | ICD-10-CM | POA: Diagnosis not present

## 2021-07-27 DIAGNOSIS — Z90711 Acquired absence of uterus with remaining cervical stump: Secondary | ICD-10-CM

## 2021-07-27 DIAGNOSIS — N250 Renal osteodystrophy: Secondary | ICD-10-CM | POA: Diagnosis not present

## 2021-07-27 DIAGNOSIS — Z888 Allergy status to other drugs, medicaments and biological substances status: Secondary | ICD-10-CM

## 2021-07-27 DIAGNOSIS — Z7189 Other specified counseling: Secondary | ICD-10-CM | POA: Diagnosis not present

## 2021-07-27 DIAGNOSIS — Z794 Long term (current) use of insulin: Secondary | ICD-10-CM

## 2021-07-27 DIAGNOSIS — E43 Unspecified severe protein-calorie malnutrition: Secondary | ICD-10-CM | POA: Diagnosis not present

## 2021-07-27 DIAGNOSIS — C9100 Acute lymphoblastic leukemia not having achieved remission: Secondary | ICD-10-CM | POA: Diagnosis present

## 2021-07-27 DIAGNOSIS — I132 Hypertensive heart and chronic kidney disease with heart failure and with stage 5 chronic kidney disease, or end stage renal disease: Secondary | ICD-10-CM | POA: Diagnosis not present

## 2021-07-27 DIAGNOSIS — R079 Chest pain, unspecified: Secondary | ICD-10-CM | POA: Diagnosis not present

## 2021-07-27 DIAGNOSIS — I700 Atherosclerosis of aorta: Secondary | ICD-10-CM | POA: Diagnosis not present

## 2021-07-27 DIAGNOSIS — N2581 Secondary hyperparathyroidism of renal origin: Secondary | ICD-10-CM | POA: Diagnosis present

## 2021-07-27 DIAGNOSIS — I120 Hypertensive chronic kidney disease with stage 5 chronic kidney disease or end stage renal disease: Secondary | ICD-10-CM | POA: Diagnosis not present

## 2021-07-27 DIAGNOSIS — G9340 Encephalopathy, unspecified: Secondary | ICD-10-CM | POA: Diagnosis present

## 2021-07-27 DIAGNOSIS — Z8701 Personal history of pneumonia (recurrent): Secondary | ICD-10-CM

## 2021-07-27 DIAGNOSIS — R0902 Hypoxemia: Secondary | ICD-10-CM | POA: Insufficient documentation

## 2021-07-27 DIAGNOSIS — J189 Pneumonia, unspecified organism: Secondary | ICD-10-CM | POA: Diagnosis not present

## 2021-07-27 DIAGNOSIS — I10 Essential (primary) hypertension: Secondary | ICD-10-CM | POA: Diagnosis not present

## 2021-07-27 DIAGNOSIS — Z66 Do not resuscitate: Secondary | ICD-10-CM | POA: Diagnosis not present

## 2021-07-27 DIAGNOSIS — D709 Neutropenia, unspecified: Secondary | ICD-10-CM | POA: Diagnosis present

## 2021-07-27 DIAGNOSIS — Z992 Dependence on renal dialysis: Secondary | ICD-10-CM

## 2021-07-27 DIAGNOSIS — Z681 Body mass index (BMI) 19 or less, adult: Secondary | ICD-10-CM | POA: Diagnosis not present

## 2021-07-27 DIAGNOSIS — Z833 Family history of diabetes mellitus: Secondary | ICD-10-CM

## 2021-07-27 DIAGNOSIS — I5043 Acute on chronic combined systolic (congestive) and diastolic (congestive) heart failure: Secondary | ICD-10-CM | POA: Diagnosis present

## 2021-07-27 DIAGNOSIS — Z515 Encounter for palliative care: Secondary | ICD-10-CM | POA: Diagnosis not present

## 2021-07-27 DIAGNOSIS — R4182 Altered mental status, unspecified: Secondary | ICD-10-CM | POA: Diagnosis not present

## 2021-07-27 DIAGNOSIS — U071 COVID-19: Secondary | ICD-10-CM | POA: Diagnosis not present

## 2021-07-27 DIAGNOSIS — D696 Thrombocytopenia, unspecified: Secondary | ICD-10-CM | POA: Diagnosis present

## 2021-07-27 DIAGNOSIS — M898X9 Other specified disorders of bone, unspecified site: Secondary | ICD-10-CM | POA: Diagnosis present

## 2021-07-27 DIAGNOSIS — J9601 Acute respiratory failure with hypoxia: Secondary | ICD-10-CM | POA: Diagnosis present

## 2021-07-27 DIAGNOSIS — R410 Disorientation, unspecified: Secondary | ICD-10-CM | POA: Diagnosis not present

## 2021-07-27 DIAGNOSIS — G9341 Metabolic encephalopathy: Secondary | ICD-10-CM | POA: Diagnosis present

## 2021-07-27 DIAGNOSIS — I3139 Other pericardial effusion (noninflammatory): Secondary | ICD-10-CM | POA: Diagnosis not present

## 2021-07-27 DIAGNOSIS — Z8673 Personal history of transient ischemic attack (TIA), and cerebral infarction without residual deficits: Secondary | ICD-10-CM

## 2021-07-27 DIAGNOSIS — N186 End stage renal disease: Secondary | ICD-10-CM

## 2021-07-27 DIAGNOSIS — C9110 Chronic lymphocytic leukemia of B-cell type not having achieved remission: Secondary | ICD-10-CM | POA: Diagnosis present

## 2021-07-27 DIAGNOSIS — Z7401 Bed confinement status: Secondary | ICD-10-CM

## 2021-07-27 DIAGNOSIS — I509 Heart failure, unspecified: Secondary | ICD-10-CM | POA: Diagnosis not present

## 2021-07-27 DIAGNOSIS — Z79899 Other long term (current) drug therapy: Secondary | ICD-10-CM

## 2021-07-27 DIAGNOSIS — R627 Adult failure to thrive: Secondary | ICD-10-CM | POA: Diagnosis not present

## 2021-07-27 DIAGNOSIS — J90 Pleural effusion, not elsewhere classified: Secondary | ICD-10-CM | POA: Diagnosis not present

## 2021-07-27 DIAGNOSIS — D631 Anemia in chronic kidney disease: Secondary | ICD-10-CM | POA: Diagnosis present

## 2021-07-27 DIAGNOSIS — J329 Chronic sinusitis, unspecified: Secondary | ICD-10-CM | POA: Diagnosis not present

## 2021-07-27 DIAGNOSIS — E8809 Other disorders of plasma-protein metabolism, not elsewhere classified: Secondary | ICD-10-CM | POA: Diagnosis present

## 2021-07-27 DIAGNOSIS — R918 Other nonspecific abnormal finding of lung field: Secondary | ICD-10-CM | POA: Diagnosis present

## 2021-07-27 DIAGNOSIS — G319 Degenerative disease of nervous system, unspecified: Secondary | ICD-10-CM | POA: Diagnosis not present

## 2021-07-27 DIAGNOSIS — R531 Weakness: Secondary | ICD-10-CM | POA: Diagnosis not present

## 2021-07-27 DIAGNOSIS — Z9221 Personal history of antineoplastic chemotherapy: Secondary | ICD-10-CM

## 2021-07-27 LAB — COMPREHENSIVE METABOLIC PANEL
ALT: 15 U/L (ref 0–44)
AST: 28 U/L (ref 15–41)
Albumin: 2.6 g/dL — ABNORMAL LOW (ref 3.5–5.0)
Alkaline Phosphatase: 99 U/L (ref 38–126)
Anion gap: 11 (ref 5–15)
BUN: 30 mg/dL — ABNORMAL HIGH (ref 8–23)
CO2: 35 mmol/L — ABNORMAL HIGH (ref 22–32)
Calcium: 8.8 mg/dL — ABNORMAL LOW (ref 8.9–10.3)
Chloride: 93 mmol/L — ABNORMAL LOW (ref 98–111)
Creatinine, Ser: 3.72 mg/dL — ABNORMAL HIGH (ref 0.44–1.00)
GFR, Estimated: 12 mL/min — ABNORMAL LOW (ref >60)
Glucose, Bld: 169 mg/dL — ABNORMAL HIGH (ref 70–99)
Potassium: 3.8 mmol/L (ref 3.5–5.1)
Sodium: 139 mmol/L (ref 135–145)
Total Bilirubin: 0.6 mg/dL (ref 0.3–1.2)
Total Protein: 5 g/dL — ABNORMAL LOW (ref 6.5–8.1)

## 2021-07-27 LAB — CBC WITH DIFFERENTIAL/PLATELET
Abs Immature Granulocytes: 0 10*3/uL (ref 0.00–0.07)
Basophils Absolute: 0 10*3/uL (ref 0.0–0.1)
Basophils Relative: 0 %
Eosinophils Absolute: 3.2 10*3/uL — ABNORMAL HIGH (ref 0.0–0.5)
Eosinophils Relative: 5 %
HCT: 33 % — ABNORMAL LOW (ref 36.0–46.0)
Hemoglobin: 10.4 g/dL — ABNORMAL LOW (ref 12.0–15.0)
Lymphocytes Relative: 93 %
Lymphs Abs: 60 10*3/uL — ABNORMAL HIGH (ref 0.7–4.0)
MCH: 27 pg (ref 26.0–34.0)
MCHC: 31.5 g/dL (ref 30.0–36.0)
MCV: 85.7 fL (ref 80.0–100.0)
Monocytes Absolute: 0.6 10*3/uL (ref 0.1–1.0)
Monocytes Relative: 1 %
Neutro Abs: 0.6 10*3/uL — ABNORMAL LOW (ref 1.7–7.7)
Neutrophils Relative %: 1 %
Platelets: 53 10*3/uL — ABNORMAL LOW (ref 150–400)
RBC: 3.85 MIL/uL — ABNORMAL LOW (ref 3.87–5.11)
RDW: 17 % — ABNORMAL HIGH (ref 11.5–15.5)
Smear Review: DECREASED
WBC: 64.5 10*3/uL (ref 4.0–10.5)
nRBC: 0 % (ref 0.0–0.2)

## 2021-07-27 LAB — RESP PANEL BY RT-PCR (FLU A&B, COVID) ARPGX2
Influenza A by PCR: NEGATIVE
Influenza B by PCR: NEGATIVE
SARS Coronavirus 2 by RT PCR: POSITIVE — AB

## 2021-07-27 LAB — HEMOGLOBIN A1C
Hgb A1c MFr Bld: 6.1 % — ABNORMAL HIGH (ref 4.8–5.6)
Mean Plasma Glucose: 128.37 mg/dL

## 2021-07-27 LAB — LACTIC ACID, PLASMA
Lactic Acid, Venous: 1.6 mmol/L (ref 0.5–1.9)
Lactic Acid, Venous: 1.1 mmol/L (ref 0.5–1.9)

## 2021-07-27 LAB — TROPONIN I (HIGH SENSITIVITY)
Troponin I (High Sensitivity): 15 ng/L (ref <18)
Troponin I (High Sensitivity): 12 ng/L (ref <18)

## 2021-07-27 LAB — CBG MONITORING, ED: Glucose-Capillary: 159 mg/dL — ABNORMAL HIGH (ref 70–99)

## 2021-07-27 IMAGING — CT CT CHEST W/O CM
2 of 4 series · 15 of 36 positions shown, 18 images · non-contrast
Comparison: [DATE]

CLINICAL DATA: Pleural effusion

EXAM:
CT CHEST WITHOUT CONTRAST
TECHNIQUE: Multidetector CT imaging of the chest was performed following the
standard protocol without IV contrast.

[Series 3: chest wo · axial · 0.54mm/px · z∈[+1140,+1340]mm · 12 of 120 slices shown, 15 images]
[im 10/120  mediastinal]
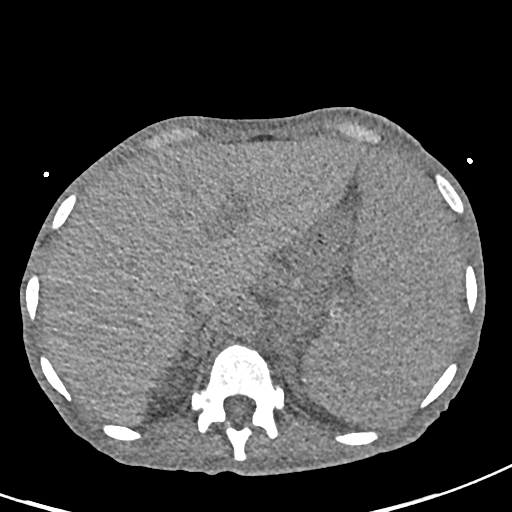
[im 10/120  lung]
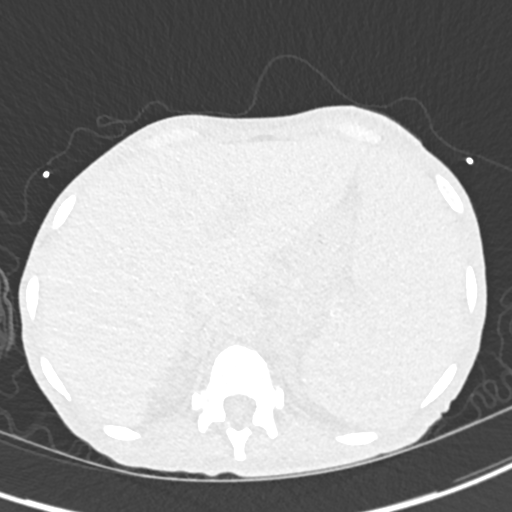
[im 19/120  lung]
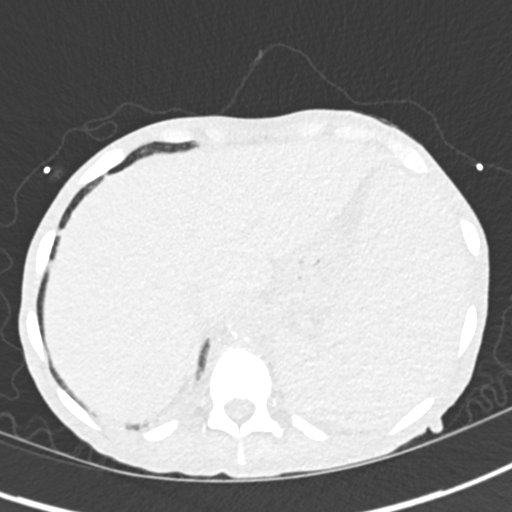
[im 28/120  lung]
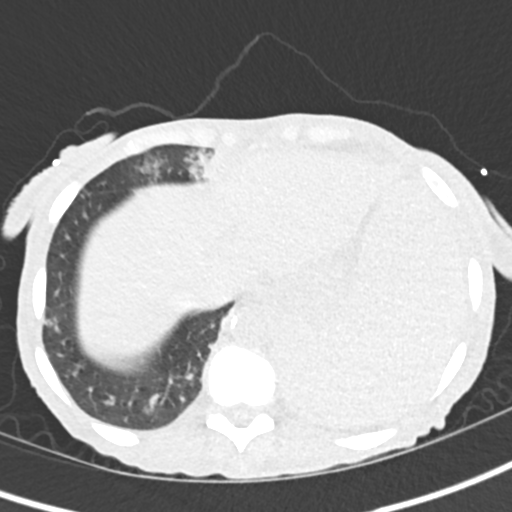
[im 37/120  lung]
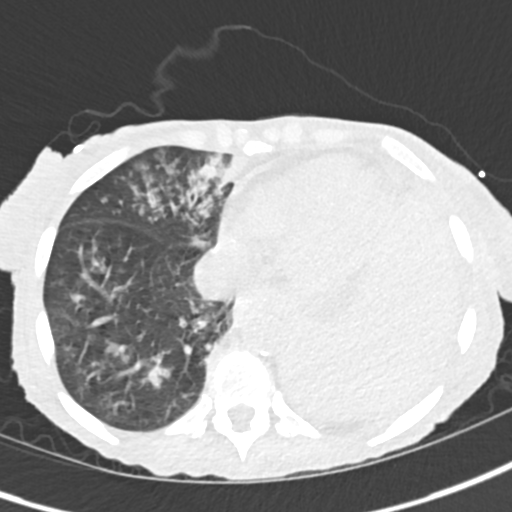
[im 46/120  mediastinal]
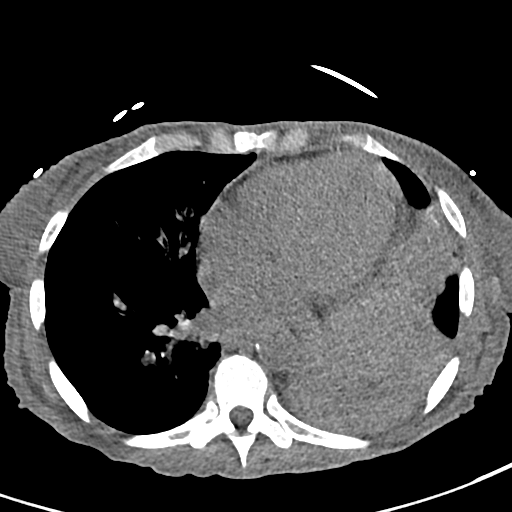
[im 46/120  lung]
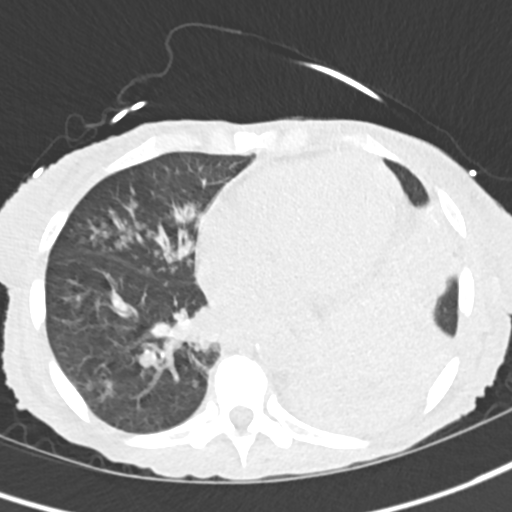
[im 55/120  lung]
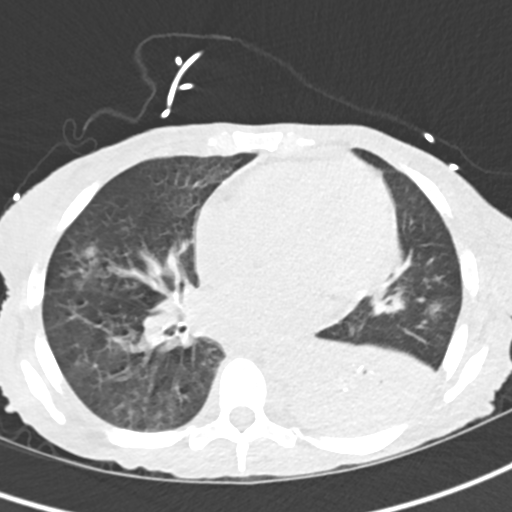
[im 65/120  lung]
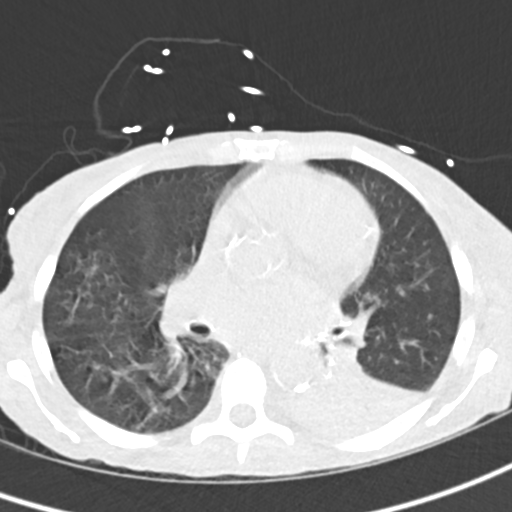
[im 74/120  lung]
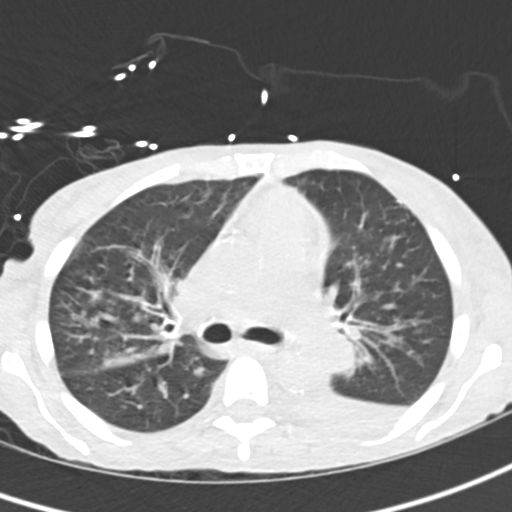
[im 83/120  mediastinal]
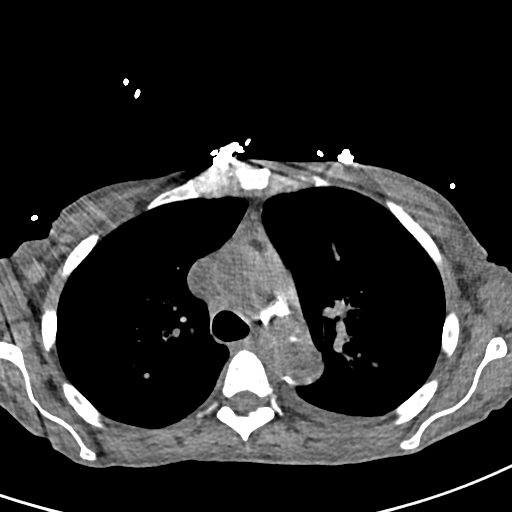
[im 83/120  lung]
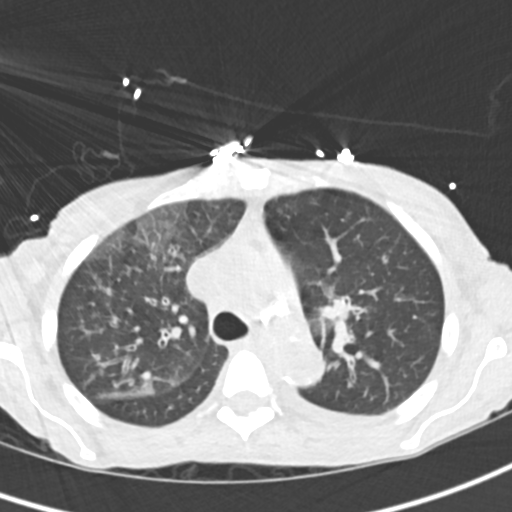
[im 92/120  lung]
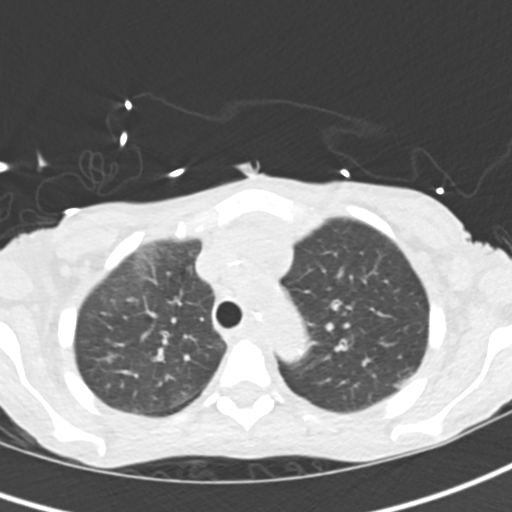
[im 101/120  lung]
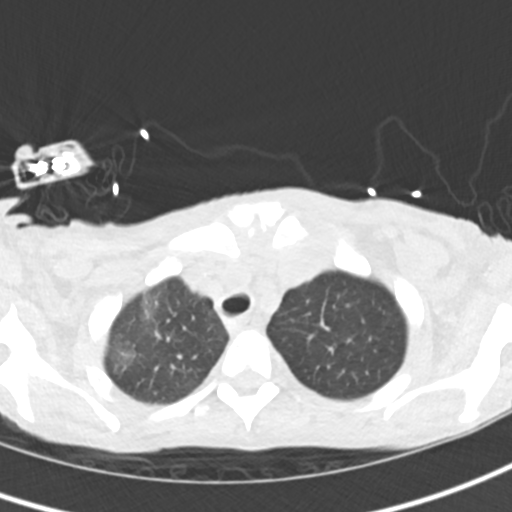
[im 110/120  lung]
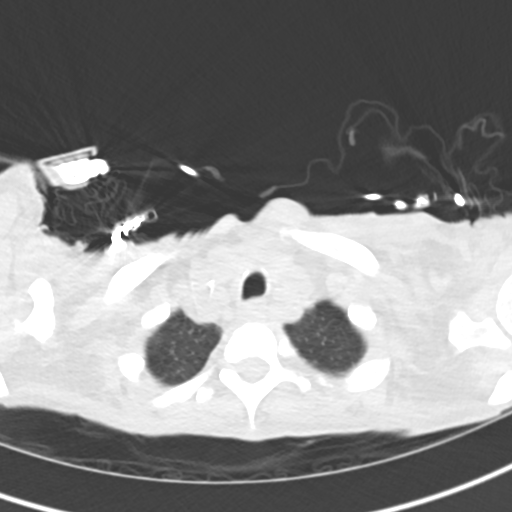

[Series 6: cor · coronal · 0.48mm/px · 3 of 103 slices shown]
[im 21/103  lung]
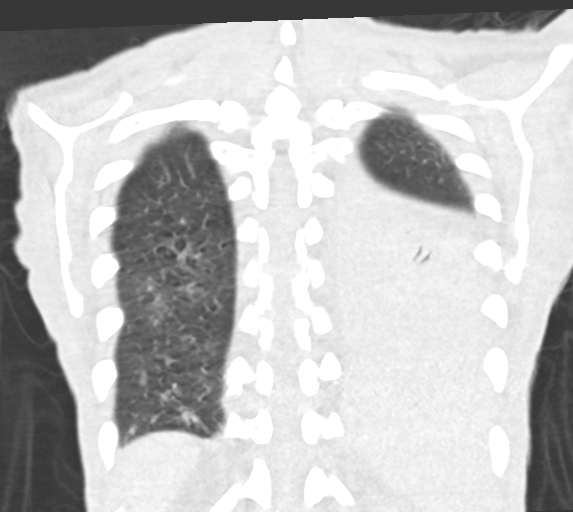
[im 41/103  lung]
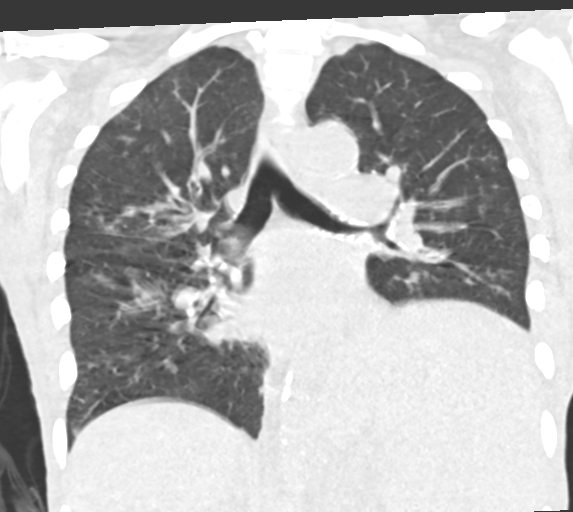
[im 62/103  lung]
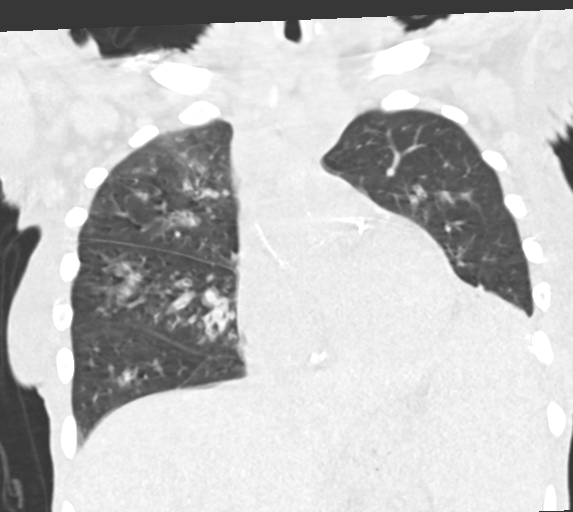

[15 of 36 positions shown; findings below may reference images not displayed]

FINDINGS: Cardiovascular: Extensive multi-vessel coronary artery
calcification. Global cardiac size is within normal limits. Trace
pericardial effusion. Central pulmonary arteries are enlarged in
keeping with changes of pulmonary arterial hypertension. Moderate
atherosclerotic calcification within the thoracic aorta. No aortic
aneurysm.

Mediastinum/Nodes: Shotty bilateral axillary adenopathy is unchanged
with the index left axillary lymph node measuring 11 mm in short
axis diameter at axial image # [DATE]. No new pathologic thoracic
adenopathy. Visualized thyroid unremarkable. The esophagus is
unremarkable.

Lungs/Pleura: There is progressive peribronchial and centrilobular
pulmonary infiltrate within the a right lung as well as progressive
diffuse bronchial wall thickening in keeping with changes of
multifocal bronchopneumonia in the appropriate clinical setting.
Scattered areas of airway impaction are noted within the lung bases
bilaterally with complete impaction of the left lower lobar bronchus
and complete collapse of the left lower lobe. This appears similar
to prior examination. Right middle lobe collapse has improved. Trace
associated left pleural effusion is stable. No pneumothorax.

Upper Abdomen: Splenomegaly is partially visualized, but appears
stable no acute abnormality.

Musculoskeletal: No acute bone abnormality. No lytic or blastic bone
lesion.
IMPRESSION: Progressive diffuse bronchial wall thickening and development of a
asymmetric multifocal pulmonary infiltrate in keeping with
progressive changes of multifocal bronchopneumonia in the
appropriate clinical setting.

Multifocal airway impaction at the visualized lung bases with
complete impaction of the left lower lobar pulmonary bronchus and
complete left lower lobe collapse, unchanged. Interval improvement
in right middle lobe collapse.

Stable trace left pleural effusion.

Extensive coronary artery calcification.

Morphologic changes in keeping with pulmonary arterial hypertension.

Stable shotty bilateral axillary adenopathy, nonspecific.

Aortic Atherosclerosis ([R2]-[R2]).

## 2021-07-27 IMAGING — CT CT HEAD W/O CM
4 of 5 series · 16 of 47 positions shown, 18 images · non-contrast
Comparison: [DATE]

CLINICAL DATA: Delirium.  Weakness.

EXAM:
CT HEAD WITHOUT CONTRAST
TECHNIQUE: Contiguous axial images were obtained from the base of the skull
through the vertex without intravenous contrast.

[Series 3: head without · axial · non-contrast · 0.49mm/px · z∈[-128,-28]mm · 4 of 34 slices shown]
[im 7/34  brain]
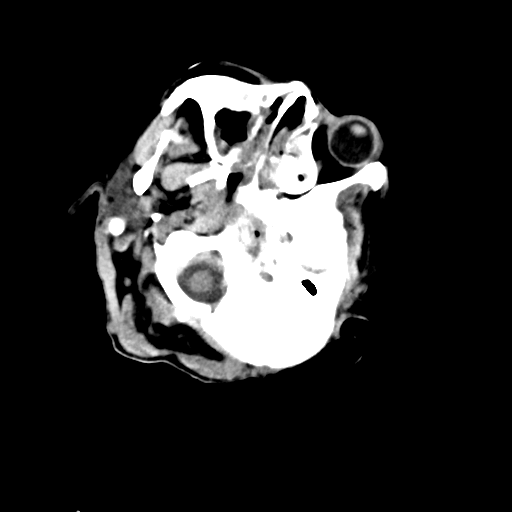
[im 14/34  brain]
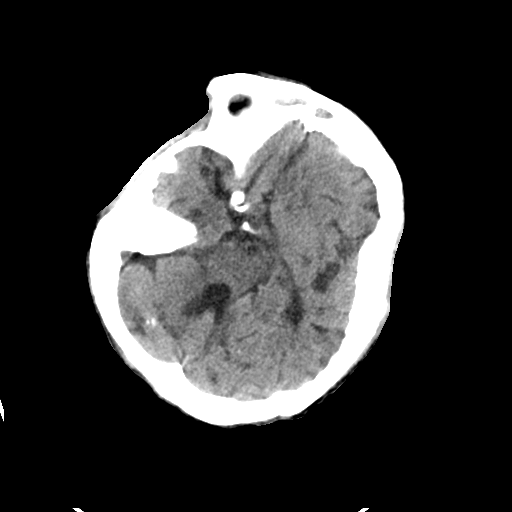
[im 20/34  brain]
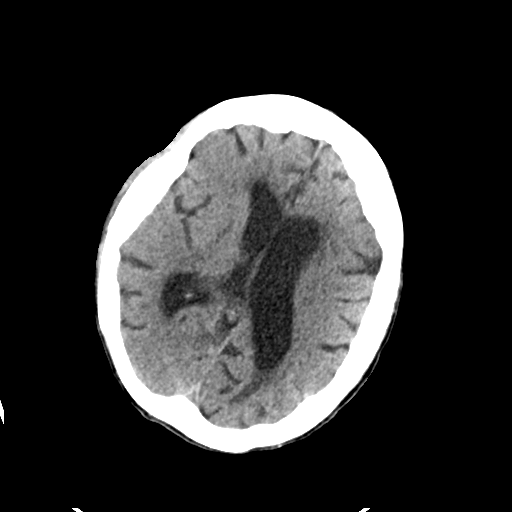
[im 27/34  brain]
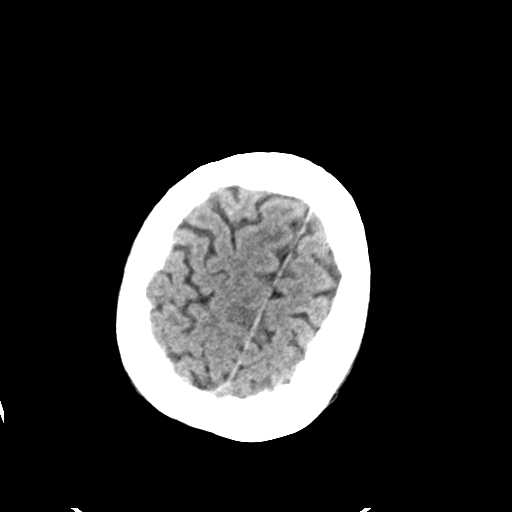

[Series 5: head without cor · coronal · non-contrast · 0.37mm/px · 3 of 67 slices shown]
[im 23/67  brain]
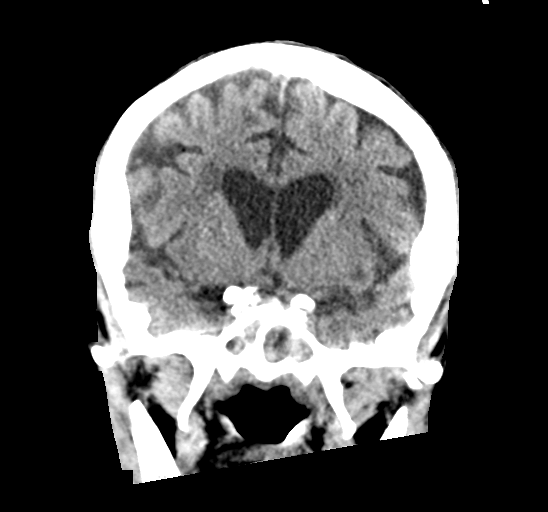
[im 30/67  brain]
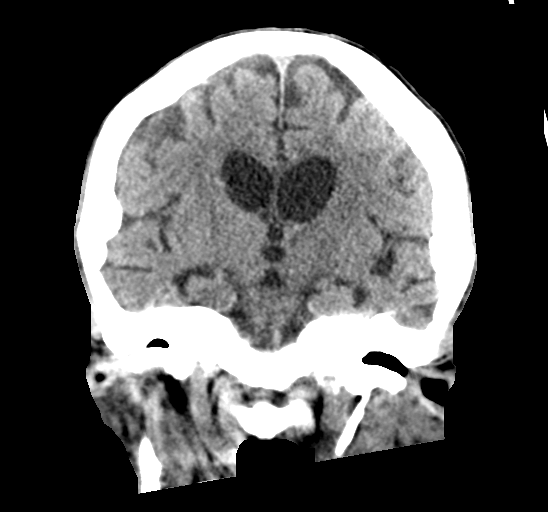
[im 37/67  brain]
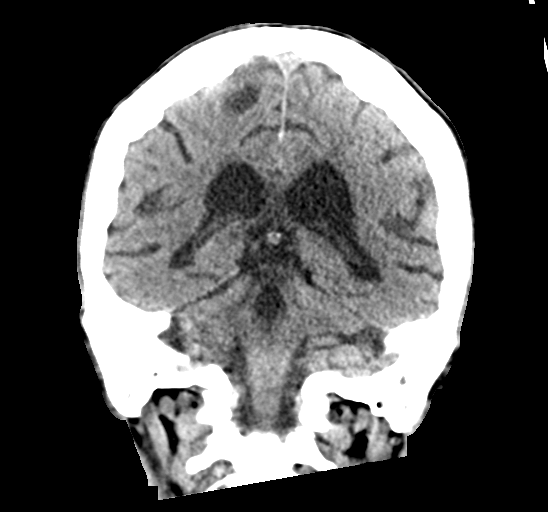

[Series 6: head without sag · sagittal · non-contrast · 0.38mm/px · 3 of 63 slices shown]
[im 29/63  brain]
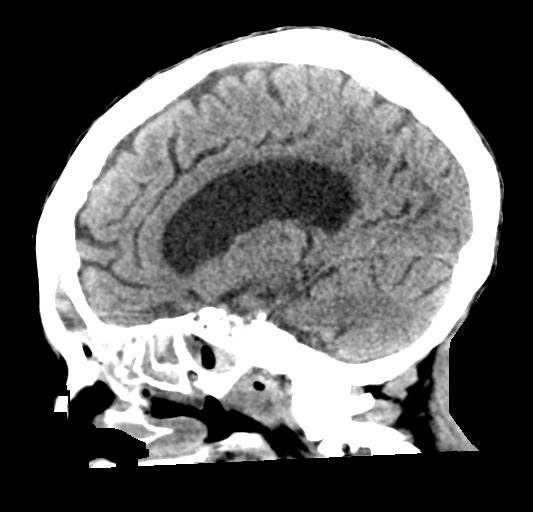
[im 33/63  brain]
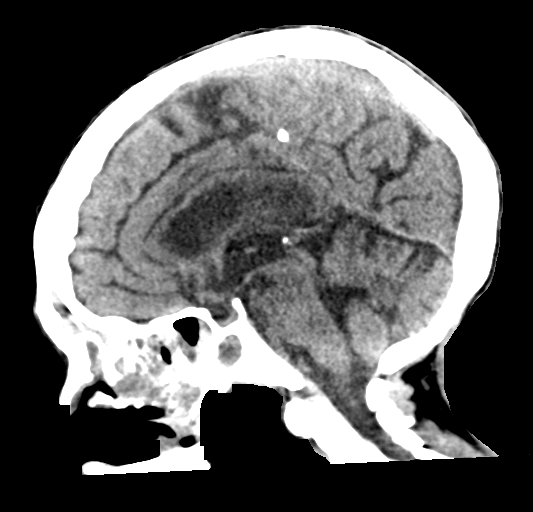
[im 37/63  brain]
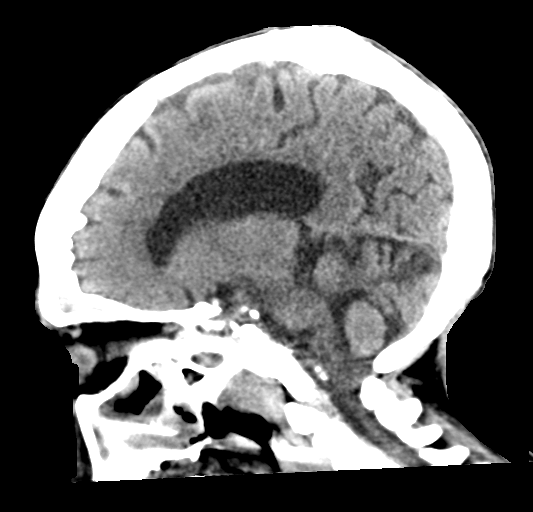

[Series 7: head without ax · axial · non-contrast · 0.49mm/px · z∈[-104,+24]mm · 6 of 39 slices shown, 8 images]
[im 6/39  brain]
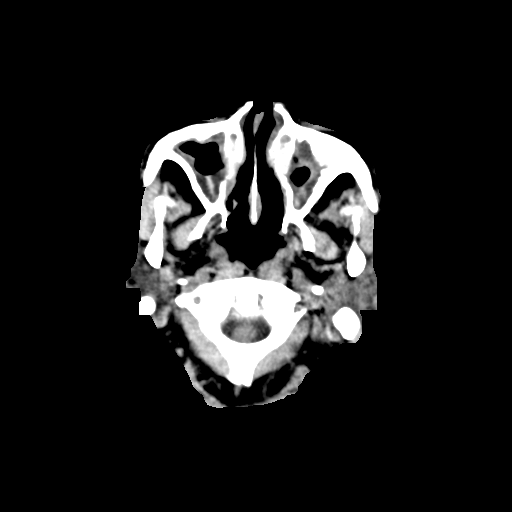
[im 6/39  bone]
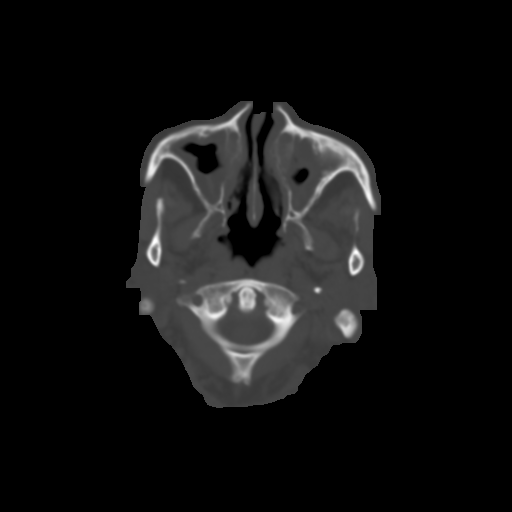
[im 11/39  brain]
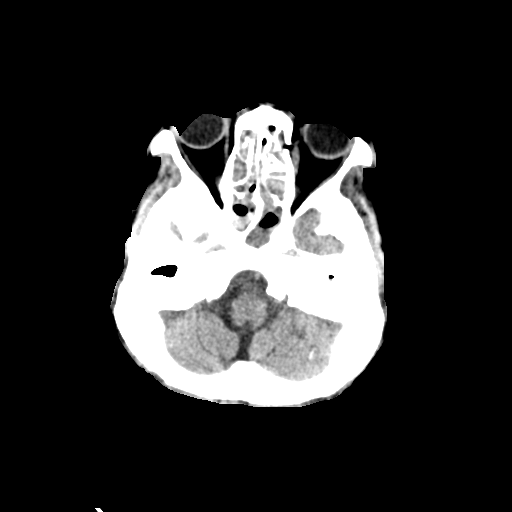
[im 17/39  brain]
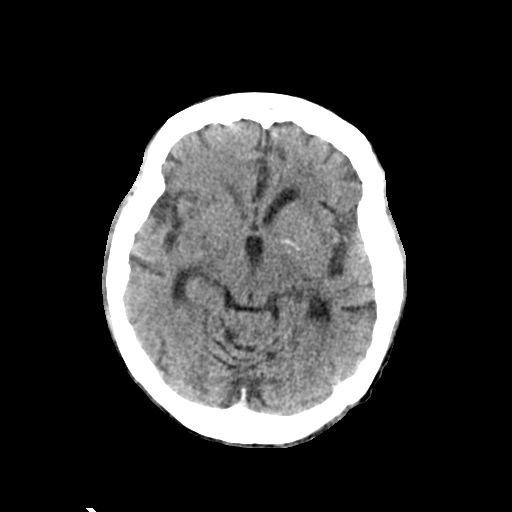
[im 22/39  brain]
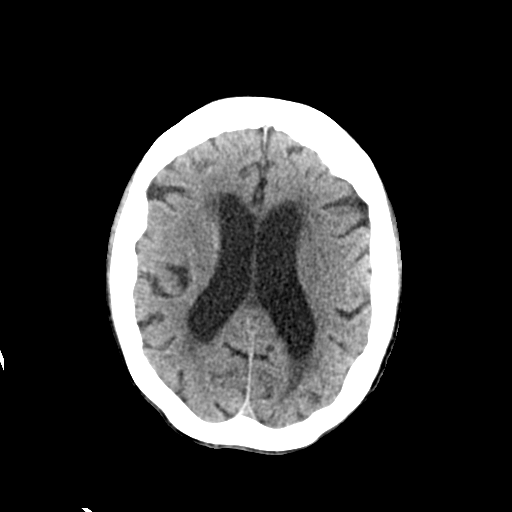
[im 28/39  brain]
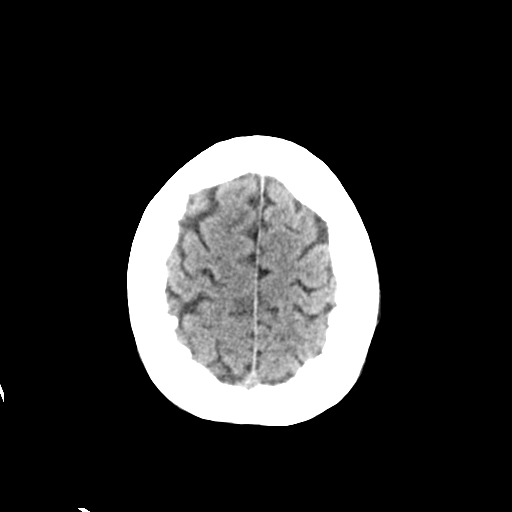
[im 28/39  bone]
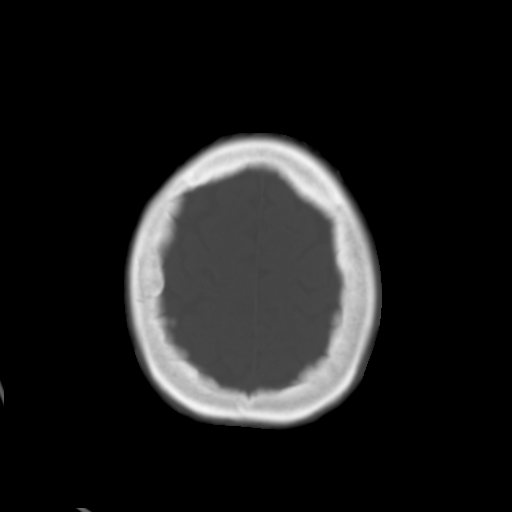
[im 33/39  brain]
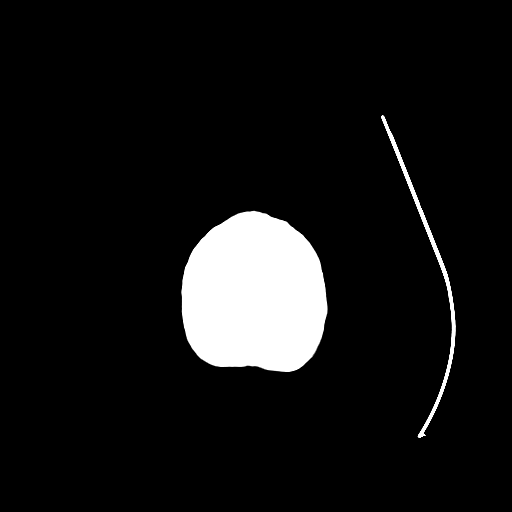

[16 of 47 positions shown; findings below may reference images not displayed]

FINDINGS: Brain: No evidence of acute infarction, hemorrhage, hydrocephalus,
extra-axial collection or mass lesion/mass effect. There is mild
diffuse low-attenuation within the subcortical and periventricular
white matter compatible with chronic microvascular disease. Remote
infarct with dystrophic calcifications noted within the right
cerebellar hemisphere, unchanged from previous exam. Prominence of
the sulci and ventricles consistent with brain atrophy.

Vascular: No hyperdense vessel or unexpected calcification.

Skull: Normal. Negative for fracture or focal lesion.

Sinuses/Orbits: Marked mucosal thickening is noted involving the
sphenoid sinus, frontal sinus and bilateral maxillary sinuses.
Opacification of the ethmoid air cells noted. Bilateral mastoid air
cell effusions

Other: None.
IMPRESSION: 1. No acute intracranial abnormalities.
2. Chronic small vessel ischemic disease and brain atrophy.
3. Chronic sinus inflammation.
4. Bilateral mastoid air cell effusions.

## 2021-07-27 MED ORDER — AMLODIPINE BESYLATE 5 MG PO TABS: 10.0000 mg | ORAL_TABLET | Freq: Every day | ORAL | Status: DC

## 2021-07-27 MED ORDER — LORATADINE 10 MG PO TABS
10.0000 mg | ORAL_TABLET | Freq: Every day | ORAL | Status: DC
  Administered 2021-07-27 – 2021-07-28 (×2): 10 mg via ORAL
  Filled 2021-07-27 (×2): qty 1

## 2021-07-27 MED ORDER — HYDRALAZINE HCL 25 MG PO TABS
25.0000 mg | ORAL_TABLET | Freq: Four times a day (QID) | ORAL | Status: DC | PRN
  Filled 2021-07-27: qty 1

## 2021-07-27 MED ORDER — ONDANSETRON HCL 4 MG/2ML IJ SOLN
4.0000 mg | Freq: Four times a day (QID) | INTRAMUSCULAR | Status: DC | PRN
  Administered 2021-07-28: 4 mg via INTRAVENOUS
  Filled 2021-07-27: qty 2

## 2021-07-27 MED ORDER — SODIUM CHLORIDE 0.9 % IV SOLN
500.0000 mg | INTRAVENOUS | Status: DC
  Filled 2021-07-27: qty 500

## 2021-07-27 MED ORDER — OXYCODONE HCL 5 MG PO TABS
5.0000 mg | ORAL_TABLET | Freq: Four times a day (QID) | ORAL | Status: DC | PRN
  Administered 2021-07-28 – 2021-07-29 (×4): 5 mg via ORAL
  Filled 2021-07-27 (×4): qty 1

## 2021-07-27 MED ORDER — ONDANSETRON HCL 4 MG PO TABS: 4.0000 mg | ORAL_TABLET | Freq: Four times a day (QID) | ORAL | Status: DC | PRN

## 2021-07-27 MED ORDER — PANTOPRAZOLE SODIUM 40 MG PO TBEC
40.0000 mg | DELAYED_RELEASE_TABLET | Freq: Two times a day (BID) | ORAL | Status: DC
  Administered 2021-07-27 – 2021-07-28 (×3): 40 mg via ORAL
  Filled 2021-07-27 (×3): qty 1

## 2021-07-27 MED ORDER — HYDROCORT-PRAMOXINE (PERIANAL) 1-1 % EX FOAM
1.0000 | Freq: Three times a day (TID) | CUTANEOUS | Status: DC
  Administered 2021-07-28 – 2021-07-31 (×11): 1 via RECTAL
  Filled 2021-07-27 (×3): qty 10

## 2021-07-27 MED ORDER — ATORVASTATIN CALCIUM 40 MG PO TABS
40.0000 mg | ORAL_TABLET | Freq: Every day | ORAL | Status: DC
  Administered 2021-07-27 – 2021-07-28 (×2): 40 mg via ORAL
  Filled 2021-07-27 (×2): qty 1

## 2021-07-27 MED ORDER — ALBUTEROL SULFATE HFA 108 (90 BASE) MCG/ACT IN AERS
1.0000 | INHALATION_SPRAY | Freq: Four times a day (QID) | RESPIRATORY_TRACT | Status: DC | PRN
  Filled 2021-07-27: qty 6.7

## 2021-07-27 MED ORDER — ACETAMINOPHEN 325 MG PO TABS
650.0000 mg | ORAL_TABLET | ORAL | Status: DC | PRN
  Administered 2021-07-28: 650 mg via ORAL
  Filled 2021-07-27: qty 2

## 2021-07-27 MED ORDER — INSULIN ASPART 100 UNIT/ML IJ SOLN
0.0000 [IU] | Freq: Three times a day (TID) | INTRAMUSCULAR | Status: DC
  Administered 2021-07-28: 1 [IU] via SUBCUTANEOUS

## 2021-07-27 MED ORDER — SODIUM CHLORIDE 0.9 % IV SOLN
1.0000 g | Freq: Once | INTRAVENOUS | Status: AC
  Administered 2021-07-27: 1 g via INTRAVENOUS
  Filled 2021-07-27: qty 10

## 2021-07-27 MED ORDER — LEVETIRACETAM 250 MG PO TABS
250.0000 mg | ORAL_TABLET | ORAL | Status: DC
  Administered 2021-07-27 – 2021-07-29 (×2): 250 mg via ORAL
  Filled 2021-07-27 (×2): qty 1

## 2021-07-27 MED ORDER — CARVEDILOL 25 MG PO TABS
25.0000 mg | ORAL_TABLET | Freq: Two times a day (BID) | ORAL | Status: DC
  Administered 2021-07-27 – 2021-07-28 (×3): 25 mg via ORAL
  Filled 2021-07-27: qty 1
  Filled 2021-07-27: qty 2
  Filled 2021-07-27: qty 1

## 2021-07-27 MED ORDER — SODIUM CHLORIDE 0.9 % IV SOLN
1.0000 g | INTRAVENOUS | Status: DC
  Administered 2021-07-27 – 2021-07-28 (×2): 1 g via INTRAVENOUS
  Filled 2021-07-27 (×4): qty 1

## 2021-07-27 MED ORDER — MELATONIN 3 MG PO TABS
3.0000 mg | ORAL_TABLET | Freq: Every day | ORAL | Status: DC | PRN
  Administered 2021-07-29: 3 mg via ORAL
  Filled 2021-07-27: qty 1

## 2021-07-27 MED ORDER — ACETAMINOPHEN 325 MG PO TABS: 650.0000 mg | ORAL_TABLET | Freq: Four times a day (QID) | ORAL | Status: DC | PRN

## 2021-07-27 MED ORDER — LEVETIRACETAM 500 MG PO TABS
500.0000 mg | ORAL_TABLET | Freq: Every day | ORAL | Status: DC
  Administered 2021-07-28 – 2021-07-30 (×3): 500 mg via ORAL
  Filled 2021-07-27 (×3): qty 1

## 2021-07-27 MED ORDER — ACETAMINOPHEN 650 MG RE SUPP: 650.0000 mg | Freq: Four times a day (QID) | RECTAL | Status: DC | PRN

## 2021-07-27 MED ORDER — HYDRALAZINE HCL 50 MG PO TABS
50.0000 mg | ORAL_TABLET | Freq: Three times a day (TID) | ORAL | Status: DC
  Administered 2021-07-27 – 2021-07-29 (×5): 50 mg via ORAL
  Filled 2021-07-27: qty 2
  Filled 2021-07-27 (×4): qty 1

## 2021-07-27 NOTE — ED Triage Notes (Signed)
Pt from dialysis with ems, called for CP but pt denies chest pain. Pt c.o feeling weak, currently getting chemo.Pt confused about the year.   CBG 187 BP 140/62 HR 86 98% 3 L [place]  Pt was 1 hr into dialysis when they called ems.

## 2021-07-27 NOTE — ED Notes (Signed)
HD fistula still accessed, IV team called to de access.

## 2021-07-27 NOTE — ED Notes (Signed)
Pt placed in gown and put on monitor. Pt aox3, confused about the year. Complains of pain in chest and bottom.

## 2021-07-27 NOTE — ED Provider Notes (Addendum)
[facility] EMERGENCY DEPARTMENT Provider Note   CSN: [id number] Arrival date & time: 07/27/21  1318     History No chief complaint on file.   Christine Gibson is a 70 y.o. female.  HPI This is a 70 year old female with extensive past medical history including ESRD on dialysis, diabetes, hypertension, CLL on chemo, recent brain contusion versus hemorrhage with new seizures, and recent aspiration pneumonia who presents with chest pain from dialysis. Reported on room air at home and now requiring 4 L O2 by nasal cannula for hypoxia to the 80s. Patient able to provide only her name and the year, unable to provide significant additional history.  Does endorse chest pain but is unable to describe.  Denies shortness of breath, cough, and fevers.  On chart review patient was recently discharged 9/29 after being admitted with confusion and fever and was found to have an aspiration pneumonia.  Was discharged to SNF.  Palliative care consult was encouraged at that time but patient's husband declined. On nephrology documentation on day of discharge patient noted to still be confused.     Past Medical History:  Diagnosis Date   Chronic kidney disease    Diabetes mellitus without complication ([place])    ESRD (end stage renal disease) ([place])    Clinician notes   History of leukemia    Hypertension     Patient Active Problem List   Diagnosis Date Noted   Goals of care, counseling/discussion    Palliative care by specialist    ICH (intracerebral hemorrhage) ([place]) 06/27/2021   Acute encephalopathy 06/26/2021   Community acquired pneumonia of right middle lobe of lung 06/26/2021   Sore in nostril 05/31/2021   COVID-19 05/13/2021   ESRD needing dialysis ([place]) 05/13/2021   Acute CVA (cerebrovascular accident) ([place]) 05/09/2021   Thrombocytopenia ([place])    Iron deficiency anemia 01/06/2021   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/2021   CKD stage 5 secondary to hypertension ([place]) 07/29/2016   HLD  (hyperlipidemia) 07/19/2015   HTN (hypertension) 07/19/2015   DM (diabetes mellitus), type 2 ([place]) 07/19/2015   CLL (chronic lymphocytic leukemia) ([place])     Past Surgical History:  Procedure Laterality Date   ABDOMINAL HYSTERECTOMY  1991   partial   CESAREAN SECTION     x 3     OB History   No obstetric history on file.     Family History  Problem Relation Age of Onset   Hyperlipidemia Mother    Diabetes Mother    Diabetes Father    Hyperlipidemia Father     Social History   Tobacco Use   Smoking status: Never   Smokeless tobacco: Never  Vaping Use   Vaping Use: Never used  Substance Use Topics   Alcohol use: Yes    Alcohol/week: 0.0 standard drinks    Comment: occassional   Drug use: No    Home Medications Prior to Admission medications   Medication Sig Start Date End Date Taking? Authorizing Provider  acetaminophen (TYLENOL) 325 MG tablet Take 650 mg by mouth every 4 (four) hours as needed for mild pain or fever.    [provider]  albuterol (VENTOLIN HFA) 108 (90 Base) MCG/ACT inhaler Inhale 1 puff into the lungs every 6 (six) hours as needed for wheezing or shortness of breath.    [provider]  amLODipine (NORVASC) 5 MG tablet Take 2 tablets (10 mg total) by mouth daily. 07/20/21   Ghimire, Henreitta Leber, MD  atorvastatin (LIPITOR) 40 MG  tablet Take 40 mg by mouth daily.    [provider]  carvedilol (COREG) 25 MG tablet Take 25 mg by mouth 2 (two) times daily with a meal.    [provider]  hydrALAZINE (APRESOLINE) 50 MG tablet Take 1 tablet (50 mg total) by mouth every 8 (eight) hours. 07/20/21   Ghimire, Henreitta Leber, MD  hydrocortisone-pramoxine [facility]) 2.5-1 % rectal cream Place 1 application rectally 3 (three) times daily. 06/23/21   [provider]  insulin aspart (NOVOLOG) 100 UNIT/ML injection 0-6 Units, Subcutaneous, 3 times daily with meals CBG < 70: Implement Hypoglycemia measures CBG 70 - 120: 0 units  CBG 121 - 150: 0 units CBG 151 - 200: 1 unit CBG 201-250: 2 units CBG 251-300: 3 units CBG 301-350: 4 units CBG 351-400: 5 units CBG > 400: Give 6 units and call MD 07/20/21   Jonetta Osgood, MD  levETIRAcetam (KEPPRA) 250 MG tablet Take 1 tablet (250 mg total) by mouth every Tuesday, Thursday, and Saturday at 6 PM. 07/20/21   Ghimire, Henreitta Leber, MD  levETIRAcetam (KEPPRA) 500 MG tablet Take 1 tablet (500 mg total) by mouth daily. 07/20/21   Ghimire, Henreitta Leber, MD  lidocaine (XYLOCAINE) 5 % ointment Apply 1 application topically 2 (two) times daily as needed for moderate pain (rectal pain). 06/01/20   [provider]  loratadine (CLARITIN) 10 MG tablet Take 10 mg by mouth daily.    [provider]  melatonin 3 MG TABS tablet Take 3 mg by mouth daily as needed (sleep).    [provider]  mupirocin nasal ointment (BACTROBAN NASAL) 2 % Place 1 application into the nose 2 (two) times daily. Apply to sore on nostril twice daily Patient not taking: No sig reported 06/01/21   Michela Pitcher, NP  oxyCODONE (OXY IR/ROXICODONE) 5 MG immediate release tablet Take 1 tablet (5 mg total) by mouth every 6 (six) hours as needed. 07/20/21   Ghimire, Henreitta Leber, MD  pantoprazole (PROTONIX) 40 MG tablet Take 1 tablet (40 mg total) by mouth 2 (two) times daily. 07/20/21   Ghimire, Henreitta Leber, MD    Allergies    Lisinopril  Review of Systems   Review of Systems  Unable to perform ROS: Mental status change   Physical Exam Updated Vital Signs BP (!) 151/64   Pulse 86   Temp 99.3 F (37.4 C) (Oral)   Resp (!) 24   LMP  (LMP Unknown)   SpO2 100%   Physical Exam Vitals and nursing note reviewed.  Constitutional:      General: She is not in acute distress.    Appearance: She is well-developed. She is ill-appearing.  HENT:     Head: Normocephalic and atraumatic.  Eyes:     Conjunctiva/sclera: Conjunctivae normal.  Cardiovascular:     Rate and Rhythm: Normal rate and regular rhythm.      Heart sounds: No murmur heard. Pulmonary:     Effort: Pulmonary effort is normal. No respiratory distress.     Breath sounds: Rhonchi present. No wheezing.  Abdominal:     Palpations: Abdomen is soft.     Tenderness: There is no abdominal tenderness.  Musculoskeletal:     Cervical back: Neck supple.  Skin:    General: Skin is warm and dry.  Neurological:     Mental Status: She is alert. Mental status is at baseline.     Comments: Answers Y/N questions but disoriented to circumstances.     ED  Results / Procedures / Treatments   Labs (all labs ordered are listed, but only abnormal results are displayed) Labs Reviewed  RESP PANEL BY RT-PCR (FLU A&B, COVID) ARPGX2 - Abnormal; Notable for the following components:      Result Value   SARS Coronavirus 2 by RT PCR POSITIVE (*)    All other components within normal limits  CBC WITH DIFFERENTIAL/PLATELET - Abnormal; Notable for the following components:   WBC 64.5 (*)    RBC 3.85 (*)    Hemoglobin 10.4 (*)    HCT 33.0 (*)    RDW 17.0 (*)    Platelets 53 (*)    Neutro Abs 0.6 (*)    Lymphs Abs 60.0 (*)    Eosinophils Absolute 3.2 (*)    All other components within normal limits  COMPREHENSIVE METABOLIC PANEL - Abnormal; Notable for the following components:   Chloride 93 (*)    CO2 35 (*)    Glucose, Bld 169 (*)    BUN 30 (*)    Creatinine, Ser 3.72 (*)    Calcium 8.8 (*)    Total Protein 5.0 (*)    Albumin 2.6 (*)    GFR, Estimated 12 (*)    All other components within normal limits  HEMOGLOBIN A1C - Abnormal; Notable for the following components:   Hgb A1c MFr Bld 6.1 (*)    All other components within normal limits  CBG MONITORING, ED - Abnormal; Notable for the following components:   Glucose-Capillary 159 (*)    All other components within normal limits  CULTURE, BLOOD (ROUTINE X 2)  CULTURE, BLOOD (ROUTINE X 2)  LACTIC ACID, PLASMA  LACTIC ACID, PLASMA  PATHOLOGIST SMEAR REVIEW  CBC  BASIC METABOLIC PANEL   PROCALCITONIN  PROCALCITONIN  TROPONIN I (HIGH SENSITIVITY)  TROPONIN I (HIGH SENSITIVITY)    EKG EKG Interpretation  Date/Time:  Thursday July 27 2021 13:38:59 EDT Ventricular Rate:  85 PR Interval:  130 QRS Duration: 76 QT Interval:  366 QTC Calculation: 435 R Axis:   -9 Text Interpretation: Normal sinus rhythm Left ventricular hypertrophy ( R in aVL , Sokolow-Lyon , Cornell product ) Anterior infarct , age undetermined Abnormal ECG No significant change since last tracing Confirmed by Deno Etienne [phone]) on 07/27/2021 3:14:29 PM  Radiology DG Chest 2 View  Result Date: 07/27/2021 CLINICAL DATA:  Dialysis patient.  Chest pain.  Weakness EXAM: CHEST - 2 VIEW COMPARISON:  06/26/2021 FINDINGS: The cardiac silhouette is chronically enlarged. There is aortic atherosclerosis. There is pulmonary venous hypertension with interstitial edema. There is a left effusion with left lower lobe volume loss, worsened since 1 month ago. IMPRESSION: Congestive heart failure with interstitial pulmonary edema. Worsened/enlarged left effusion with left base volume loss. Electronically Signed   By: Nelson Chimes M.D.   On: 07/27/2021 14:43   CT HEAD WO CONTRAST (5MM)  Result Date: 07/27/2021 CLINICAL DATA:  Delirium.  Weakness. EXAM: CT HEAD WITHOUT CONTRAST TECHNIQUE: Contiguous axial images were obtained from the base of the skull through the vertex without intravenous contrast. COMPARISON:  06/26/2021 FINDINGS: Brain: No evidence of acute infarction, hemorrhage, hydrocephalus, extra-axial collection or mass lesion/mass effect. There is mild diffuse low-attenuation within the subcortical and periventricular white matter compatible with chronic microvascular disease. 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 Prominence of the sulci and ventricles consistent with brain atrophy. Vascular: No hyperdense vessel or unexpected  calcification. Skull: Normal. Negative for fracture or focal lesion.  Sinuses/Orbits: Marked mucosal thickening is noted involving the sphenoid sinus, frontal sinus and bilateral maxillary sinuses. Opacification of the ethmoid air cells noted. Bilateral mastoid air cell effusions Other: None. IMPRESSION: 1. No acute intracranial abnormalities. 2. 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 3. Chronic sinus inflammation. 4. Bilateral mastoid air cell effusions. Electronically Signed   By: Kerby Moors M.D.   On: 07/27/2021 15:11    Procedures Procedures   Medications Ordered in ED Medications - No data to display  ED Course  I have reviewed the triage vital signs and the nursing notes.  Pertinent labs & imaging results that were available during my care of the patient were reviewed by me and considered in my medical decision making (Christine chart for details).    MDM Rules/Calculators/A&P                          Patient stable on initial evaluation but confused and appears chronically ill. Triage note comments on AMS but patient seems to be at her new neurologic baseline according to chart review as above and CT head is without acute intracranial abnormalities. Items on the ddx for CP and new O2 requirement include pneumonia, pneumothorax, covid, CHF, volume overload, ACS, anemia.   CBC with significant leukocytosis with WBC 64 but improved from prior in 90s. Hgb stable at 10. CMP is at baseline. EKG without acute ischemic changes and troponin negativex2. CXR with new L pleural effusion vs aspiration. Patient does not appear clinically volume overloaded, no JVD and no peripheral edema. Lungs are coarse throughout but worse on the L. Patient coughs often while in room. Exam and workup concerning for likely new aspiration with O2 req. PE also on differential, but given exam and CXR, aspiration is more likely. Also, no clinical signs of DVT and no tachycardia. Given ceftriaxone and  azithromycin.   Admitted for new O2 requirement.     Final Clinical Impression(s) / ED Diagnoses Final diagnoses:  Hypoxia  Precordial pain    Rx / DC Orders ED Discharge Orders     None        Coralee Pesa, MD 07/28/21 0205    Coralee Pesa, MD 07/28/21 Gay[place], Dan, DO 07/28/21 1619

## 2021-07-27 NOTE — H&P (Signed)
History and Physical    Christine Gibson YPP:[id number] DOB: 1951/01/02 DOA: 07/27/2021  PCP: Michela Pitcher, NP (Confirm with patient/family/NH records and if not entered, this has to be entered at [facility] point of entry) Patient coming from: SNF  I have personally briefly reviewed patient's old medical records in [place]  Chief Complaint: AMS  HPI: Christine Gibson is a 70 y.o. 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  Patient is currently confused, unable to provide any history, most history provided by ED staff and reviewing of nursing home papers.  Reportedly, patient developed confusion and complain about chest pain during dialysis, and it is unknown how much time patient had HD today.  Patient however is very confused and unable confirm whether she had chest pain earlier.  ED work-up showed troponin negative x2, EKG showed chronic LVH and nonspecific ST changes.  X-ray showed worsening of left lung infiltrates versus worsening of left-sided pleural effusion.  ED also found patient had a new onset of oxygen amount, and stabilized on 3 L.  And patient still tachypneic. BP stable and no tachycardia.  ED started patient on ceftriaxone and azithromycin.  Review of Systems: Unable to perform, patient confused.  Past Medical History:  Diagnosis Date   Chronic kidney disease    Diabetes mellitus without complication ([place])    ESRD (end stage renal disease) ([place])    Clinician notes   History of leukemia    Hypertension     Past Surgical History:  Procedure Laterality Date   ABDOMINAL HYSTERECTOMY  1991   partial   CESAREAN SECTION     x 3     reports that she has never smoked. She has  never used smokeless tobacco. She reports current alcohol use. She reports that she does not use drugs.  Allergies  Allergen Reactions   Lisinopril Cough    Family History  Problem Relation Age of Onset   Hyperlipidemia Mother    Diabetes Mother    Diabetes Father    Hyperlipidemia Father      Prior to Admission medications   Medication Sig Start Date End Date Taking? Authorizing Provider  acetaminophen (TYLENOL) 325 MG tablet Take 650 mg by mouth every 4 (four) hours as needed for mild pain or fever.    [provider]  albuterol (VENTOLIN HFA) 108 (90 Base) MCG/ACT inhaler Inhale 1 puff into the lungs every 6 (six) hours as needed for wheezing or shortness of breath.    [provider]  amLODipine (NORVASC) 5 MG tablet Take 2 tablets (10 mg total) by mouth daily. 07/20/21   Ghimire, Henreitta Leber, MD  atorvastatin (LIPITOR) 40 MG tablet Take 40 mg by mouth daily.    [provider]  carvedilol (COREG) 25 MG tablet Take 25 mg by mouth 2 (two) times daily with a meal.    [provider]  hydrALAZINE (APRESOLINE) 50 MG tablet Take 1 tablet (50 mg total) by mouth every 8 (eight) hours. 07/20/21   Ghimire, Henreitta Leber, MD  hydrocortisone-pramoxine [facility]) 2.5-1 % rectal cream Place 1 application rectally 3 (three) times daily. 06/23/21   [provider]  insulin aspart (NOVOLOG) 100 UNIT/ML injection 0-6 Units, Subcutaneous, 3 times  daily with meals CBG < 70: Implement Hypoglycemia measures CBG 70 - 120: 0 units CBG 121 - 150: 0 units CBG 151 - 200: 1 unit CBG 201-250: 2 units CBG 251-300: 3 units CBG 301-350: 4 units CBG 351-400: 5 units CBG > 400: Give 6 units and call MD 07/20/21   Jonetta Osgood, MD  levETIRAcetam (KEPPRA) 250 MG tablet Take 1 tablet (250 mg total) by mouth every Tuesday, Thursday, and Saturday at 6 PM. 07/20/21   Ghimire, Henreitta Leber, MD  levETIRAcetam (KEPPRA) 500 MG tablet Take 1 tablet (500 mg total) by mouth daily. 07/20/21    Ghimire, Henreitta Leber, MD  lidocaine (XYLOCAINE) 5 % ointment Apply 1 application topically 2 (two) times daily as needed for moderate pain (rectal pain). 06/01/20   [provider]  loratadine (CLARITIN) 10 MG tablet Take 10 mg by mouth daily.    [provider]  melatonin 3 MG TABS tablet Take 3 mg by mouth daily as needed (sleep).    [provider]  mupirocin nasal ointment (BACTROBAN NASAL) 2 % Place 1 application into the nose 2 (two) times daily. Apply to sore on nostril twice daily Patient not taking: No sig reported 06/01/21   Michela Pitcher, NP  oxyCODONE (OXY IR/ROXICODONE) 5 MG immediate release tablet Take 1 tablet (5 mg total) by mouth every 6 (six) hours as needed. 07/20/21   Ghimire, Henreitta Leber, MD  pantoprazole (PROTONIX) 40 MG tablet Take 1 tablet (40 mg total) by mouth 2 (two) times daily. 07/20/21   Jonetta Osgood, MD    Physical Exam: Vitals:   07/27/21 1354 07/27/21 1522  BP: (!) 134/51 (!) 151/64  Pulse: 82 86  Resp: 16 (!) 24  Temp: 99.3 F (37.4 C)   TempSrc: Oral   SpO2: 100% 100%    Constitutional: NAD, calm, comfortable Vitals:   07/27/21 1354 07/27/21 1522  BP: (!) 134/51 (!) 151/64  Pulse: 82 86  Resp: 16 (!) 24  Temp: 99.3 F (37.4 C)   TempSrc: Oral   SpO2: 100% 100%   Eyes: PERRL, lids and conjunctivae normal ENMT: Mucous membranes are moist. Posterior pharynx clear of any exudate or lesions.Normal dentition.  Neck: normal, supple, no masses, no thyromegaly Respiratory: clear to auscultation bilaterally, no wheezing, crackles on left lower field. Increase respiratory effort. No accessory muscle use.  Cardiovascular: Regular rate and rhythm, no murmurs / rubs / gallops.  1+ pitting edema left> right. 2+ pedal pulses. No carotid bruits.  Abdomen: no tenderness, no masses palpated. No hepatosplenomegaly. Bowel sounds positive.  Musculoskeletal: no clubbing / cyanosis. No joint deformity upper and lower extremities. Good ROM,  no contractures. Normal muscle tone.  Skin: no rashes, lesions, ulcers. No induration Neurologic: No facial droops, moving all limbs, following simple commands Psychiatric: Awake alert, confused, oriented to herself, confused about time and place.    Labs on Admission: I have personally reviewed following labs and imaging studies  CBC: Recent Labs  Lab 07/27/21 1404  WBC 64.5*  NEUTROABS 0.6*  HGB 10.4*  HCT 33.0*  MCV 85.7  PLT 53*   Basic Metabolic Panel: Recent Labs  Lab 07/27/21 1404  NA 139  K 3.8  CL 93*  CO2 35*  GLUCOSE 169*  BUN 30*  CREATININE 3.72*  CALCIUM 8.8*   GFR: Estimated Creatinine Clearance: 8.5 mL/min (A) (by C-G formula based on SCr of 3.72 mg/dL (H)). Liver Function Tests: Recent Labs  Lab 07/27/21 1404  AST 28  ALT 15  ALKPHOS 99  BILITOT 0.6  PROT 5.0*  ALBUMIN 2.6*   No results for input(s): LIPASE, AMYLASE in the last 168 hours. No results for input(s): AMMONIA in the last 168 hours. Coagulation Profile: No results for input(s): INR, PROTIME in the last 168 hours. Cardiac Enzymes: No results for input(s): CKTOTAL, CKMB, CKMBINDEX, TROPONINI in the last 168 hours. BNP (last 3 results) No results for input(s): PROBNP in the last 8760 hours. HbA1C: No results for input(s): HGBA1C in the last 72 hours. CBG: Recent Labs  Lab 07/20/21 2010  GLUCAP 157*   Lipid Profile: No results for input(s): CHOL, HDL, LDLCALC, TRIG, CHOLHDL, LDLDIRECT in the last 72 hours. Thyroid Function Tests: No results for input(s): TSH, T4TOTAL, FREET4, T3FREE, THYROIDAB in the last 72 hours. Anemia Panel: No results for input(s): VITAMINB12, FOLATE, FERRITIN, TIBC, IRON, RETICCTPCT in the last 72 hours. Urine analysis:    Component Value Date/Time   COLORURINE STRAW (A) 01/12/2019 1130   APPEARANCEUR CLEAR 01/12/2019 1130   LABSPEC 1.007 01/12/2019 1130   PHURINE 7.0 01/12/2019 1130   GLUCOSEU 150 (A) 01/12/2019 1130   HGBUR NEGATIVE 01/12/2019  1130   BILIRUBINUR NEGATIVE 01/12/2019 1130   KETONESUR NEGATIVE 01/12/2019 1130   PROTEINUR >=300 (A) 01/12/2019 1130   NITRITE NEGATIVE 01/12/2019 1130   LEUKOCYTESUR NEGATIVE 01/12/2019 1130    Radiological Exams on Admission: DG Chest 2 View  Result Date: 07/27/2021 CLINICAL DATA:  Dialysis patient.  Chest pain.  Weakness EXAM: CHEST - 2 VIEW COMPARISON:  06/26/2021 FINDINGS: The cardiac silhouette is chronically enlarged. There is aortic atherosclerosis. There is pulmonary venous hypertension with interstitial edema. There is a left effusion with left lower lobe volume loss, worsened since 1 month ago. IMPRESSION: Congestive heart failure with interstitial pulmonary edema. Worsened/enlarged left effusion with left base volume loss. Electronically Signed   By: Nelson Chimes M.D.   On: 07/27/2021 14:43   CT HEAD WO CONTRAST (5MM)  Result Date: 07/27/2021 CLINICAL DATA:  Delirium.  Weakness. EXAM: CT HEAD WITHOUT CONTRAST TECHNIQUE: Contiguous axial images were obtained from the base of the skull through the vertex without intravenous contrast. COMPARISON:  06/26/2021 FINDINGS: Brain: No evidence of acute infarction, hemorrhage, hydrocephalus, extra-axial collection or mass lesion/mass effect. There is mild diffuse low-attenuation within the subcortical and periventricular white matter compatible with chronic microvascular disease. 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 Prominence of the sulci and ventricles consistent with brain atrophy. Vascular: No hyperdense vessel or unexpected calcification. Skull: Normal. Negative for fracture or focal lesion. Sinuses/Orbits: Marked mucosal thickening is noted involving the sphenoid sinus, frontal sinus and bilateral maxillary sinuses. Opacification of the ethmoid air cells noted. Bilateral mastoid air cell effusions Other: None. IMPRESSION: 1. No acute intracranial  abnormalities. 2. 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 3. Chronic sinus inflammation. 4. Bilateral mastoid air cell effusions. Electronically Signed   By: Kerby Moors M.D.   On: 07/27/2021 15:11    EKG: Independently reviewed. LVH and chronic T wave flattening V4 through V6.  Assessment/Plan Active Problems:   Acute encephalopathy   Encephalopathy  (please populate well all problems here in Problem List. (For example, if patient is on BP meds at home and you resume or decide to hold them, it is a problem that needs to be her. Same for CAD, COPD, HLD and so on)  Acute metabolic encephalopathy -CT head negative for acute findings -Probably related to new onset  of acute hypoxia likely secondary to worsening of her CHF and pleural effusion.  Pneumonia cannot be ruled out at this point.  We will continue antibiotics for now, send procalcitonin level. -Avoid sedation medication given her breathing status.  Acute hypoxic respiratory failure, likely secondary to acute on chronic decompensated combined systolic and diastolic CHF -Stabilized on 3 L -Etiology considered to be related decompensated CHF, given x-ray finding. -Treatment wise, discussed with on-call nephrology Dr. Justin Mend, given patient other comorbidities of CLL, CHF, will wait for a CT scan to decide thoracentesis for HD in the morning. -Other etiologies such as PE, is of low suspicion and patient persistent thrombocytopenia is contraindicated for anticoagulation.  Neutropenia -Neutrophils 600, probably chronic secondary to CLL -She has a low-grade fever, and recurrent pneumonia cannot be ruled out, change antibiotics from ceftriaxone to cefepime to cover both HCAP and possible neutropenic fever.  CLL with worsening of lymphocytosis -Currently about acute leukemia transformation -Patient used to follow-up with [facility] oncology, but off Zanubrutinib and then switched to hospice.  Will discuss with patient husband  regarding prognosis.  ESRD on HD -Nephrology consulted, planning for HD tomorrow.  HTN -Hold CCB for decompensated CHF -Continue hydralazine and as needed hydralazine.  Seizure disorder -No acute issue, continue Keppra.  IDDM -Sliding scale.  DVT prophylaxis: SCD Code Status: DNR Family Communication: Husband over the phone (was driving and on her way to hospital tonight) Disposition Plan: Expect more than 2 midnight hospital stay Consults called: Nephrology Admission status: Tele admit   Lequita Halt MD [facility] Pager [phone]  07/27/2021, 5:17 PM

## 2021-07-27 NOTE — Progress Notes (Signed)
Deaccessed Venous HD fistula -Held pressure for 15" than pt started coughing & had rebleeding x 2 - held pressure for 20" had RN in ED raise head of bed and get a [place]auer suction so I could suction her mouth while holding pressure. Finally achieved hemostasis - at 1730 pulled Arterial catheter from fistula and held pressure for 30" since pt is still coughing achieved hemostasis. Both sites have 2 folded 4x4's  and 2" tape. Total time for this was 1 hour & 45"

## 2021-07-27 NOTE — ED Provider Notes (Addendum)
Emergency Medicine Provider Triage Evaluation Note  Christine Gibson , a 70 y.o. female  was evaluated in triage.  Pt complains of chest pain.  Patient dialysis patient.  Apparently developed chest pain while on dialysis machine.  Was only able to do 1 hour.  Currently denies any chest pain.  Husband states she does not typically wear oxygen at home however she is requiring 3 L via nasal cannula here.  Does not make urine.  Has had worsening confusion over the last year.  Admitted 1 month ago for pneumonia as well as COVID.  She is very weak per family.  Decreased p.o. intake. Currently getting chemo for CLL. Discharged 1 week ago. MR brain with acute/ subacute frontal hemorrhage/contusion, Seizure while hospitalized Review of Systems  Positive: Confusion, weakness, CP, SOB, hypoxia Negative: Fever, emesis  Physical Exam  LMP  (LMP Unknown)  Gen:   Awake, no distress   Resp:  Tachypnea, course lung sounds, hypoxia MSK:   Moves extremities without difficulty  Neuro:  Alert to person, place. No time Other:    Medical Decision Making  Medically screening exam initiated at 1:52 PM.  Appropriate orders placed.  Christine Gibson was informed that the remainder of the evaluation will be completed by another provider, this initial triage assessment does not replace that evaluation, and the importance of remaining in the ED until their evaluation is complete.  Weakness, hypoxia, confusion  Nursing aware patient needs room in back       Georgianna Band A, PA-C 07/27/21 1357    Long, Wonda Olds, MD 07/30/21 2233

## 2021-07-27 NOTE — Progress Notes (Addendum)
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  Answered husband's question that, patient immunodepressed status and unlikely to recover, and patient went to get recurrent infections, with each cycles of antibiotic treatment, patient will build up resistant and going for more severe infection in the future.  Increased leukocyte will eventually cause thromboembolic event, and or other complications such as DIC as patient platelet level continues to drop.  Husband said that he had talked to patient this week regarding hospice and stopped dialysis but patient clearly said no and said that she is not ready to die.  Right now patient is confused cannot cope with goal of care conversation.  Husband at bedside agreed with continue palliative care.  We will consult inpatient Palliative care.  CT chest reviewed, no significant increase of left pleural effusion and no significant infiltrates.  Plan remains HD tomorrow.  Overall condition grave, expected life expectancy probably weeks.

## 2021-07-27 NOTE — ED Notes (Signed)
Husband is at bedside feeding pt a [place] sandwhich.   He states that she says she isnt hungry but will eat when fed. I observe this to be true.    He says similar is true of pain meds. He says he thinks she is hurting but doesn't like to take pain meds. She did tell me she doesn't have pain.  I have not tried to give her pain meds yet.

## 2021-07-27 NOTE — Progress Notes (Signed)
Pharmacy Antibiotic Note  Christine Gibson is a 70 y.o. female admitted on 07/27/2021 with  HCAP .  Pharmacy has been consulted for cefepime dosing.  Plan: Cefepime 1g q24h Trend WBC, fever, clinical course F/u cultures and nephrology plan De-escalate when able     Temp (24hrs), Avg:99.3 F (37.4 C), Min:99.3 F (37.4 C), Max:99.3 F (37.4 C)  Recent Labs  Lab 07/27/21 1404  WBC 64.5*  CREATININE 3.72*  LATICACIDVEN 1.6    Estimated Creatinine Clearance: 8.5 mL/min (A) (by C-G formula based on SCr of 3.72 mg/dL (H)).    Allergies  Allergen Reactions   Lisinopril Cough   Antimicrobials this admission: Rocephin x 1 in ED Cefepime 10/6 >>   Microbiology results: Pending  Thank you for allowing pharmacy to be a part of this patient's care.  Heloise Purpura 07/27/2021 6:29 PM

## 2021-07-28 DIAGNOSIS — Z515 Encounter for palliative care: Secondary | ICD-10-CM

## 2021-07-28 DIAGNOSIS — C9110 Chronic lymphocytic leukemia of B-cell type not having achieved remission: Secondary | ICD-10-CM

## 2021-07-28 DIAGNOSIS — Z7189 Other specified counseling: Secondary | ICD-10-CM

## 2021-07-28 DIAGNOSIS — Z66 Do not resuscitate: Secondary | ICD-10-CM

## 2021-07-28 LAB — PROCALCITONIN: Procalcitonin: 0.71 ng/mL

## 2021-07-28 LAB — CBC
HCT: 29.8 % — ABNORMAL LOW (ref 36.0–46.0)
Hemoglobin: 9.2 g/dL — ABNORMAL LOW (ref 12.0–15.0)
MCH: 26.4 pg (ref 26.0–34.0)
MCHC: 30.9 g/dL (ref 30.0–36.0)
MCV: 85.4 fL (ref 80.0–100.0)
Platelets: 48 10*3/uL — ABNORMAL LOW (ref 150–400)
RBC: 3.49 MIL/uL — ABNORMAL LOW (ref 3.87–5.11)
RDW: 17.1 % — ABNORMAL HIGH (ref 11.5–15.5)
WBC: 72.3 10*3/uL (ref 4.0–10.5)
nRBC: 0 % (ref 0.0–0.2)

## 2021-07-28 LAB — BASIC METABOLIC PANEL
Anion gap: 11 (ref 5–15)
BUN: 38 mg/dL — ABNORMAL HIGH (ref 8–23)
CO2: 32 mmol/L (ref 22–32)
Calcium: 8.7 mg/dL — ABNORMAL LOW (ref 8.9–10.3)
Chloride: 93 mmol/L — ABNORMAL LOW (ref 98–111)
Creatinine, Ser: 4.4 mg/dL — ABNORMAL HIGH (ref 0.44–1.00)
GFR, Estimated: 10 mL/min — ABNORMAL LOW (ref >60)
Glucose, Bld: 164 mg/dL — ABNORMAL HIGH (ref 70–99)
Potassium: 4 mmol/L (ref 3.5–5.1)
Sodium: 136 mmol/L (ref 135–145)

## 2021-07-28 LAB — GLUCOSE, CAPILLARY
Glucose-Capillary: 145 mg/dL — ABNORMAL HIGH (ref 70–99)
Glucose-Capillary: 150 mg/dL — ABNORMAL HIGH (ref 70–99)
Glucose-Capillary: 169 mg/dL — ABNORMAL HIGH (ref 70–99)
Glucose-Capillary: 120 mg/dL — ABNORMAL HIGH (ref 70–99)
Glucose-Capillary: 191 mg/dL — ABNORMAL HIGH (ref 70–99)

## 2021-07-28 MED ORDER — ACETAMINOPHEN 500 MG PO TABS
1000.0000 mg | ORAL_TABLET | Freq: Three times a day (TID) | ORAL | Status: DC
  Administered 2021-07-28 – 2021-07-30 (×6): 1000 mg via ORAL
  Filled 2021-07-28 (×6): qty 2

## 2021-07-28 MED ORDER — SERTRALINE HCL 50 MG PO TABS
50.0000 mg | ORAL_TABLET | Freq: Every day | ORAL | Status: DC
  Administered 2021-07-28 – 2021-07-30 (×3): 50 mg via ORAL
  Filled 2021-07-28 (×3): qty 1

## 2021-07-28 NOTE — Consult Note (Addendum)
[facility] Renal Consultation Note    Indication for Consultation:  Management of ESRD/hemodialysis, anemia, hypertension/volume, and secondary hyperparathyroidism. PCP:  HPI: Christine Gibson is a 70 y.o. female with ESRD, HTN, CLL (with leukocytosis/thrombocytopenia), and FTT who was admitted with chest pain and AMS.  Brought to ED on 10/6 via EMS from dialysis with chest pain. Got approx 1.5 hours of dialysis. In ED - vitals were normal except mild hypoxia which improved with nasal O2. Labs with Na 139, K 3.8, Alb 2.6, WBC 64.5, Hgb 10.4, plts 53. Flu negative, COVID test persistently positive from 04/2021 - deemed not new infection during admit last month. Blood Cx collected. CXR with pulm edema. Chest CT with multifocal pneumonia and multifocal air way compaction with LLL collapse (stable from prior), L pleural effusion. Started on IV antibiotics.  Seen in room today, some increased work of breathing. Very confused, not oriented to place or time.   Palliative care re-engaged, husband coming to terms that she is dying. Dr. Jonnie Finner will call her husband today to discuss further.  Dialyzes on TTS schedule at [facility] - last HD yesterday (only 1.5hr).  Past Medical History:  Diagnosis Date   Chronic kidney disease    Diabetes mellitus without complication (HCC)    ESRD (end stage renal disease) ([place])    Clinician notes   History of leukemia    Hypertension    Past Surgical History:  Procedure Laterality Date   ABDOMINAL HYSTERECTOMY  1991   partial   CESAREAN SECTION     x 3   Family History  Problem Relation Age of Onset   Hyperlipidemia Mother    Diabetes Mother    Diabetes Father    Hyperlipidemia Father    Social History:  reports that she has never smoked. She has never used smokeless tobacco. She reports current alcohol use. She reports that she does not use drugs.  ROS: As per HPI otherwise negative.  Physical Exam: Vitals:   07/28/21 0841  07/28/21 1151 07/28/21 1212 07/28/21 1213  BP:  (!) 164/68    Pulse:  81 80   Resp:  20 (!) 24   Temp:  98.6 F (37 C)    TempSrc:  Axillary    SpO2: 100% 96% (S) (!) 80% 92%  Weight: 38.6 kg        General: Frail, ^ WOB, confused. Head: Normocephalic, atraumatic, sclera non-icteric, mucus membranes are moist. Neck: Supple without lymphadenopathy/masses. JVD not elevated. Lungs: Reduced air movement in B bases Heart: RRR with normal S1, S2. No murmurs, rubs, or gallops appreciated. Abdomen: Soft, non-tender, non-distended with normoactive bowel sounds. Musculoskeletal: Minimal muscle mass Lower extremities: No edema or ischemic changes, no open wounds. Neuro: Awake, confused. Dialysis Access: AVG  Allergies  Allergen Reactions   Diltiazem    Lisinopril Cough   Prior to Admission medications   Medication Sig Start Date End Date Taking? Authorizing Provider  acetaminophen (TYLENOL) 325 MG tablet Take 650 mg by mouth every 4 (four) hours as needed for mild pain or fever.   Yes [provider]  albuterol (VENTOLIN HFA) 108 (90 Base) MCG/ACT inhaler Inhale 1 puff into the lungs every 6 (six) hours as needed for wheezing or shortness of breath.   Yes [provider]  amLODipine (NORVASC) 10 MG tablet Take 10 mg by mouth daily.   Yes [provider]  atorvastatin (LIPITOR) 40 MG tablet Take 40 mg by mouth at bedtime.   Yes [provider]  carvedilol (COREG) 25 MG tablet Take 25 mg by mouth 2 (two) times daily with a meal.   Yes [provider]  hydrALAZINE (APRESOLINE) 50 MG tablet Take 1 tablet (50 mg total) by mouth every 8 (eight) hours. 07/20/21  Yes Ghimire, Henreitta Leber, MD  hydrocortisone-pramoxine [facility]) 2.5-1 % rectal cream Place 1 application rectally 3 (three) times daily. 06/23/21  Yes [provider]  insulin aspart (NOVOLOG) 100 UNIT/ML injection 0-6 Units, Subcutaneous, 3 times daily with meals CBG < 70: Implement  Hypoglycemia measures CBG 70 - 120: 0 units CBG 121 - 150: 0 units CBG 151 - 200: 1 unit CBG 201-250: 2 units CBG 251-300: 3 units CBG 301-350: 4 units CBG 351-400: 5 units CBG > 400: Give 6 units and call MD Patient taking differently: Inject 1-6 Units into the skin 3 (three) times daily with meals. 0-6 Units, Subcutaneous, 3 times daily with meals CBG < 70: Implement Hypoglycemia measures CBG 70 - 120: 0 units CBG 121 - 150: 0 units CBG 151 - 200: 1 unit CBG 201-250: 2 units CBG 251-300: 3 units CBG 301-350: 4 units CBG 351-400: 5 units CBG > 400: Give 6 units and call MD 07/20/21  Yes Jonetta Osgood, MD  levETIRAcetam (KEPPRA) 250 MG tablet Take 1 tablet (250 mg total) by mouth every Tuesday, Thursday, and Saturday at 6 PM. 07/20/21  Yes Ghimire, Henreitta Leber, MD  levETIRAcetam (KEPPRA) 500 MG tablet Take 1 tablet (500 mg total) by mouth daily. 07/20/21  Yes Ghimire, Henreitta Leber, MD  Lidocaine, Anorectal, 5 % CREA Apply 1 application topically 2 (two) times daily as needed (rectal pain).   Yes [provider]  loratadine (CLARITIN) 10 MG tablet Take 10 mg by mouth daily.   Yes [provider]  melatonin 3 MG TABS tablet Take 3 mg by mouth at bedtime as needed (sleep).   Yes [provider]  mupirocin nasal ointment (BACTROBAN NASAL) 2 % Place 1 application into the nose 2 (two) times daily. Apply to sore on nostril twice daily 06/01/21  Yes Cable, Alyson Locket, NP  Nutritional Supplements (FEEDING SUPPLEMENT, NEPRO CARB STEADY,) LIQD Take 237 mLs by mouth in the morning and at bedtime.   Yes [provider]  oxyCODONE (OXY IR/ROXICODONE) 5 MG immediate release tablet Take 1 tablet (5 mg total) by mouth every 6 (six) hours as needed. Patient taking differently: Take 5 mg by mouth every 6 (six) hours as needed for moderate pain or severe pain. 07/20/21  Yes Ghimire, Henreitta Leber, MD  pantoprazole (PROTONIX) 40 MG tablet Take 1 tablet (40 mg total) by mouth 2 (two) times daily.  07/20/21  Yes Ghimire, Henreitta Leber, MD  sertraline (ZOLOFT) 50 MG tablet Take 50 mg by mouth daily.   Yes [provider]   Current Facility-Administered Medications  Medication Dose Route Frequency Provider Last Rate Last Admin   acetaminophen (TYLENOL) tablet 1,000 mg  1,000 mg Oral TID Rosezella Rumpf, NP       albuterol (VENTOLIN HFA) 108 (90 Base) MCG/ACT inhaler 1 puff  1 puff Inhalation Q6H PRN Wynetta Fines T, MD       atorvastatin (LIPITOR) tablet 40 mg  40 mg Oral Daily Wynetta Fines T, MD   40 mg at 07/28/21 0842   carvedilol (COREG) tablet 25 mg  25 mg Oral BID WC Wynetta Fines T, MD   25 mg at 07/28/21 0841   ceFEPIme (MAXIPIME) 1 g in sodium chloride 0.9 %  100 mL IVPB  1 g Intravenous Q24H Lequita Halt, MD   Stopped at 07/27/21 2242   hydrALAZINE (APRESOLINE) tablet 25 mg  25 mg Oral Q6H PRN Wynetta Fines T, MD       hydrALAZINE (APRESOLINE) tablet 50 mg  50 mg Oral Q8H Wynetta Fines T, MD   50 mg at 07/28/21 9924   hydrocortisone-pramoxine (PROCTOFOAM-HC) rectal foam 1 applicator  1 applicator Rectal TID Lequita Halt, MD   1 applicator at 26/83/41 9622   insulin aspart (novoLOG) injection 0-6 Units  0-6 Units Subcutaneous TID WC Lequita Halt, MD   1 Units at 07/28/21 1211   levETIRAcetam (KEPPRA) tablet 250 mg  250 mg Oral Q T,Th,Sat-1800 Wynetta Fines T, MD   250 mg at 07/27/21 2137   levETIRAcetam (KEPPRA) tablet 500 mg  500 mg Oral Daily Wynetta Fines T, MD   500 mg at 07/28/21 0842   loratadine (CLARITIN) tablet 10 mg  10 mg Oral Daily Wynetta Fines T, MD   10 mg at 07/28/21 2979   melatonin tablet 3 mg  3 mg Oral Daily PRN Lequita Halt, MD       ondansetron [facility]) tablet 4 mg  4 mg Oral Q6H PRN Lequita Halt, MD       Or   ondansetron [facility]) injection 4 mg  4 mg Intravenous Q6H PRN Wynetta Fines T, MD       oxyCODONE (Oxy IR/ROXICODONE) immediate release tablet 5 mg  5 mg Oral Q6H PRN Wynetta Fines T, MD   5 mg at 07/28/21 0842   pantoprazole (PROTONIX) EC tablet 40 mg  40 mg  Oral BID Lequita Halt, MD   40 mg at 07/28/21 0843   Labs: Basic Metabolic Panel: Recent Labs  Lab 07/27/21 1404 07/28/21 0415  NA 139 136  K 3.8 4.0  CL 93* 93*  CO2 35* 32  GLUCOSE 169* 164*  BUN 30* 38*  CREATININE 3.72* 4.40*  CALCIUM 8.8* 8.7*   Liver Function Tests: Recent Labs  Lab 07/27/21 1404  AST 28  ALT 15  ALKPHOS 99  BILITOT 0.6  PROT 5.0*  ALBUMIN 2.6*   CBC: Recent Labs  Lab 07/27/21 1404 07/28/21 0415  WBC 64.5* 72.3*  NEUTROABS 0.6*  --   HGB 10.4* 9.2*  HCT 33.0* 29.8*  MCV 85.7 85.4  PLT 53* 48*   Studies/Results: DG Chest 2 View  Result Date: 07/27/2021 CLINICAL DATA:  Dialysis patient.  Chest pain.  Weakness EXAM: CHEST - 2 VIEW COMPARISON:  06/26/2021 FINDINGS: The cardiac silhouette is chronically enlarged. There is aortic atherosclerosis. There is pulmonary venous hypertension with interstitial edema. There is a left effusion with left lower lobe volume loss, worsened since 1 month ago. IMPRESSION: Congestive heart failure with interstitial pulmonary edema. Worsened/enlarged left effusion with left base volume loss. Electronically Signed   By: Nelson Chimes M.D.   On: 07/27/2021 14:43   CT HEAD WO CONTRAST (5MM)  Result Date: 07/27/2021 CLINICAL DATA:  Delirium.  Weakness. EXAM: CT HEAD WITHOUT CONTRAST TECHNIQUE: Contiguous axial images were obtained from the base of the skull through the vertex without intravenous contrast. COMPARISON:  06/26/2021 FINDINGS: Brain: No evidence of acute infarction, hemorrhage, hydrocephalus, extra-axial collection or mass lesion/mass effect. There is mild diffuse low-attenuation within the subcortical and periventricular white matter compatible with chronic microvascular disease. 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 Prominence of the sulci and ventricles  consistent with brain atrophy. Vascular: No hyperdense vessel or unexpected  calcification. Skull: Normal. Negative for fracture or focal lesion. Sinuses/Orbits: Marked mucosal thickening is noted involving the sphenoid sinus, frontal sinus and bilateral maxillary sinuses. Opacification of the ethmoid air cells noted. Bilateral mastoid air cell effusions Other: None. IMPRESSION: 1. No acute intracranial abnormalities. 2. 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 3. Chronic sinus inflammation. 4. Bilateral mastoid air cell effusions. Electronically Signed   By: Kerby Moors M.D.   On: 07/27/2021 15:11   CT CHEST WO CONTRAST  Result Date: 07/27/2021 CLINICAL DATA:  Pleural effusion EXAM: CT CHEST WITHOUT CONTRAST TECHNIQUE: Multidetector CT imaging of the chest was performed following the standard protocol without IV contrast. COMPARISON:  06/27/2021 FINDINGS: Cardiovascular: Extensive multi-vessel coronary artery calcification. Global cardiac size is within normal limits. Trace pericardial effusion. Central pulmonary arteries are enlarged in keeping with changes of pulmonary arterial hypertension. Moderate atherosclerotic calcification within the thoracic aorta. No aortic aneurysm. Mediastinum/Nodes: Shotty bilateral axillary adenopathy is unchanged with the index left axillary lymph node measuring 11 mm in short axis diameter at axial image # 29/3. No new pathologic thoracic adenopathy. Visualized thyroid unremarkable. The esophagus is unremarkable. Lungs/Pleura: There is progressive peribronchial and centrilobular pulmonary infiltrate within the a right lung as well as progressive diffuse bronchial wall thickening in keeping with changes of multifocal bronchopneumonia in the appropriate clinical setting. Scattered areas of airway impaction are noted within the lung bases bilaterally with complete impaction of the left lower lobar bronchus and complete collapse of the left lower lobe. This appears similar to prior examination. Right middle lobe collapse has improved.  Trace associated left pleural effusion is stable. No pneumothorax. Upper Abdomen: Splenomegaly is partially visualized, but appears stable no acute abnormality. Musculoskeletal: No acute bone abnormality. No lytic or blastic bone lesion. IMPRESSION: Progressive diffuse bronchial wall thickening and development of a asymmetric multifocal pulmonary infiltrate in keeping with progressive changes of multifocal bronchopneumonia in the appropriate clinical setting. Multifocal airway impaction at the visualized lung bases with complete impaction of the left lower lobar pulmonary bronchus and complete left lower lobe collapse, unchanged. Interval improvement in right middle lobe collapse. Stable trace left pleural effusion. Extensive coronary artery calcification. Morphologic changes in keeping with pulmonary arterial hypertension. Stable shotty bilateral axillary adenopathy, nonspecific. Aortic Atherosclerosis (ICD10-I70.0). Electronically Signed   By: Fidela Salisbury M.D.   On: 07/27/2021 20:09    Dialysis Orders:  TTS at [facility] 3:30hr, 400/500, EDW 39kg, 2K/2Ca, AVG, no heparin - Hectoral 68mcg IV q HD  Assessment/Plan:  Hypoxia: Stable on nasal O2 - pneumonia v. pulm edema.  ESRD:  Usual TTS schedule - due to ^ census, will not be able to run today. She is stable on nasal O2 and labs ok. Tentatively planning for HD tomorrow if that is what they choose - will try to get fluid off as tolerated. Agree she has likely continued to lose weight. Appreciate palliative care being involved again, we are in discussion with the family regarding hospice.  Hypertension/volume: BP high, pulm edema present.  Anemia: Hgb 9.2 - follow.  Metabolic bone disease: Ca ok, follow.  CLL: Chronic leukocytosis/thrombocytopenia.  Veneta Penton, PA-C 07/28/2021, 1:57 PM  [facility]  Pt seen, examined and agree w assess/plan as above with additions as indicated. The patient has progressive CLL after being taken off her  chemoRx this summer by her [facility] doctors due to severe COVID infection / severe immunosuppression. She is bedridden and confused at  baseline now.  Continued dialysis we are concerned will just prolong her suffering. Recommend no further dialysis.  [facility] 07/28/2021, 4:00 PM

## 2021-07-28 NOTE — Progress Notes (Signed)
Manufacturing engineer [facility]) Community Based Palliative Care       This patient is currently followed by palliative care services in the community.  [facility] will continue to follow for any discharge planning needs and to coordinate continuation of palliative care.    Thank you for the opportunity to participate in this patient's care.     Domenic Moras, BSN, RN [facility] Liaison [phone] (24h on call)

## 2021-07-28 NOTE — Consult Note (Signed)
Palliative Medicine Inpatient Consult Note  Consulting Provider: Lequita Halt, MD  Reason for consult:   [facility] Consult  Reason for Consult? Hospice   HPI:  Per intake H&P --> Christine Gibson is an 70 y.o. female with medical history significant of CLL, ESRD on HD TTS, HTN, IDDM, chronic combined systolic and diastolic CHF LVEF 25% and grade 1 diastolic dysfunction on echo July 2022, recent stroke in July 2022, seizure disorder September 2022, severe protein calorie malnutrition who was sent from dialysis center for change of mentation.  Palliative care was asked to get involved in the setting of multiple comorbid conditions and recurrent hospitalizations.  Palliative care had seen and worked with [facility] extensively  Clinical Assessment/Goals of Care:  *Please note that this is a verbal dictation therefore any spelling or grammatical errors are due to the "[place] [facility]" system interpretation.  I have reviewed medical records including EPIC notes, labs and imaging, received report from bedside RN, assessed the patient who is lying in bed sharing with me that she "has pain everywhere".    I called patient's spouse, Christine Gibson to further discuss diagnosis prognosis, GOC, EOL wishes, disposition and options.  A brief review of choices past medical history was held.  We reviewed her history of end-stage renal disease --> she was initially on PD and then iHD since 09/2020. During a 03/2021 visit with oncology, hospice was recommended and she was told that she had two months to live., CLL for which she is no longer receiving chemotherapy, and diastolic heart failure.  Christine Gibson and I reviewed that Christine Gibson has been going "downhill" for quite some time now.  He shares with me that he realizes how poorly she is doing but he is having a hard time "pulling the trigger" on additional decisions.   I introduced Palliative Medicine as specialized medical  care for people living with serious illness. It focuses on providing relief from the symptoms and stress of a serious illness. The goal is to improve quality of life for both the patient and the family. Christine Gibson does remember speaking to my colleague Jocelyn during Allisha's prior hospitalization.  Christine Gibson shares three sons with Christine Gibson who live in [place], [place], and [place]. Unfortunately, their fourth son has passed away years ago after some time on dialysis. She served in [employer] until 2014 due to health decline and Christine Gibson notes that it was difficult for her to adjust to retirement. Christine Gibson is a strong-willed woman who has always cared for others by trying to do as much for herself as possible.   Prior to hospitalization a month ago, Christine Gibson lived at home with Christine Gibson and unfortunately had been declining more rapidly over the past few months.  Presently it appears she is predominantly bedbound.  A detailed discussion was had today regarding advanced directives, patient's primary decision maker is her spouse, Christine Gibson.    Concepts specific to code status, artifical feeding and hydration, continued IV antibiotics and rehospitalization was had.  Patient is DO NOT RESUSCITATE/DO NOT INTUBATE CODE STATUS.    The difference between a aggressive medical intervention path  and a palliative comfort care path for this patient at this time was had.  I broached the topic of hospice which in the past Christine Gibson had not been favorable towards.  He shares with me that he realizes the way "things are going".  He states that he does not want to see his wife suffering.  He is however having  a very hard time with additional decisions.  I asked Christine Gibson if it would be helpful to speak with the nephrologist to better understand whether or not ongoing hemodialysis is helpful to Christine Gibson.  Christine Gibson shares that he would be interested in this.  Discussed the importance of continued conversation with family and their  medical providers regarding  overall plan of care and treatment options, ensuring decisions are within the context of the patients values and GOCs.  Decision Maker: Christine Gibson (spouse) [phone]  SUMMARY OF RECOMMENDATIONS   DNAR/DNI  Appreciate nephrology speaking to Christine Gibson's spouse, Christine Gibson to further discuss the ongoing benefits versus risks of hemodialysis  Will add Tylenol around-the-clock in addition to oxycodone for pain  Ongoing conversations regarding comfort oriented care as Osha continues to decline despite aggressive interventions  Appreciate [facility] involvement as patient has prior been on outpatient palliative services  Ongoing palliative medicine team support  Code Status/Advance Care Planning: DNAR/DNI   Palliative Prophylaxis:  Oral care, turn every 2 hours, delirium precautions  Additional Recommendations (Limitations, Scope, Preferences): DO NOT RESUSCITATE patient spouse is considering more of a comfort oriented approach though he would like to hear from other healthcare team members  Psycho-social/Spiritual:  Desire for further Chaplaincy support: Yes Additional Recommendations: Education on CLL progression   Prognosis: Exceptionally poor prognosis limited weeks to months given worsening functionality and increase in overall frailty and failure to thrive with hypoalbuminemia  Discharge Planning: Discharge plan unclear  Vitals:   07/28/21 0741 07/28/21 0841  BP: (!) 158/62   Pulse: 82   Resp: (!) 21   Temp: 98.4 F (36.9 C)   SpO2: 100% 100%   No intake or output data in the 24 hours ending 07/28/21 1012 Last Weight  Most recent update: 07/28/2021  9:36 AM    Weight  38.6 kg (85 lb 1.6 oz)            Gen: Very frail elderly African-American female in moderate distress HEENT: Dry mucous membranes CV: Regular rate and rhythm PULM: clear to auscultation bilaterally ABD: soft/non-tender EXT: No edema Neuro: Alert and oriented to self  PPS: 10%   This  conversation/these recommendations were discussed with patient primary care team, Dr. Eliseo Squires  Time In: 0900 Time Out: 1010 Total Time: 70 Greater than 50%  of this time was spent counseling and coordinating care related to the above assessment and plan.  [facility] Team Team Cell Phone: [phone] Please utilize secure chat with additional questions, if there is no response within 30 minutes please call the above phone number  Palliative Medicine Team providers are available by phone from 7am to 7pm daily and can be reached through the team cell phone.  Should this patient require assistance outside of these hours, please call the patient's attending physician.

## 2021-07-28 NOTE — Progress Notes (Signed)
Was able to wean pt to room air around 1145 but around 1210 pt started to desat around 79-80% on room air. 2 L/min O2 placed back on pt, pt's spO2 has increased back up to 98%.

## 2021-07-28 NOTE — Progress Notes (Signed)
Progress Note    Christine Gibson  YFV:[id number] DOB: 1951-03-24  DOA: 07/27/2021 PCP: Michela Pitcher, NP    Brief Narrative:    Medical records reviewed and are as summarized below:  Christine Gibson is an 70 y.o. female with medical history significant of CLL, ESRD on HD TTS, HTN, IDDM, chronic combined systolic and diastolic CHF LVEF 16% and grade 1 diastolic dysfunction on echo July 2022, recent stroke in July 2022, seizure disorder September 2022, severe protein calorie malnutrition who was sent from dialysis center for change of mentation.  Assessment/Plan:   Active Problems:   Acute encephalopathy   Encephalopathy    Acute metabolic encephalopathy -this AM appears to be at her baseline -CT head negative for acute findings -Avoid sedating medication    Acute hypoxic respiratory failure, likely secondary to acute on chronic decompensated combined systolic and diastolic CHF -wean to RA as able -Etiology considered to be related decompensated CHF, given x-ray finding. -I asked for weight of patient as I ? If enough fluid has been removed with HD as suspect muscle wasting with severe protein calorie malnutrition -Other etiologies such as PE, is of low suspicion and patient persistent thrombocytopenia is contraindicated for anticoagulation.   Neutropenia -Neutrophils 600, probably chronic secondary to CLL -She has a low-grade fever, and recurrent pneumonia cannot be ruled out, change antibiotics from ceftriaxone to cefepime to cover both HCAP and possible neutropenic fever while goals of care discussed   CLL with worsening of lymphocytosis -Patient used to follow-up with [facility] oncology, but off Zanubrutinib and then switched to hospice.  Husband has reservations about hospice due to prior experience    ESRD on HD -Nephrology consulted, planning for HD tomorrow. -need to discuss if patient is a candadate   HTN -Hold CCB for decompensated CHF -Continue hydralazine and as  needed hydralazine.   Seizure disorder -No acute issue, continue Keppra.   IDDM -Sliding scale.  Overall extremely poor prognosis  Family Communication/Anticipated D/C date and plan/Code Status    Code Status: DNR Disposition Plan: Status is: Inpatient  Remains inpatient appropriate because:Inpatient level of care appropriate due to severity of illness  Dispo: The patient is from: SNF              Anticipated d/c is to:  tbd- -not sure she is a candidate for HD anymore, will defer to nephrology              Patient currently is not medically stable to d/c.       Medical Consultants:   Palliative care Nephrology   Subjective:   Brought from HD last PM for chest pain/hypoxia  Objective:    Vitals:   07/28/21 0117 07/28/21 0246 07/28/21 0356 07/28/21 0741  BP:  (!) 146/80 (!) 159/75 (!) 158/62  Pulse:  80 79 82  Resp:  19 18 (!) 21  Temp: 98.5 F (36.9 C) 98 F (36.7 C) 97.8 F (36.6 C) 98.4 F (36.9 C)  TempSrc:  Oral Oral Axillary  SpO2:  99% 97% 100%   No intake or output data in the 24 hours ending 07/28/21 0838 There were no vitals filed for this visit.  Exam: In bed, frail appearing Diminished breath sounds, no wheezing Able to answer simple questions when she can hear clearly (have to speak slowly)  Data Reviewed:   I have personally reviewed following labs and imaging studies:  Labs: Labs show the following:   Basic Metabolic Panel: Recent Labs  Lab 07/27/21 1404 07/28/21 0415  NA 139 136  K 3.8 4.0  CL 93* 93*  CO2 35* 32  GLUCOSE 169* 164*  BUN 30* 38*  CREATININE 3.72* 4.40*  CALCIUM 8.8* 8.7*   GFR Estimated Creatinine Clearance: 7.2 mL/min (A) (by C-G formula based on SCr of 4.4 mg/dL (H)). Liver Function Tests: Recent Labs  Lab 07/27/21 1404  AST 28  ALT 15  ALKPHOS 99  BILITOT 0.6  PROT 5.0*  ALBUMIN 2.6*   No results for input(s): LIPASE, AMYLASE in the last 168 hours. No results for input(s): AMMONIA in the  last 168 hours. Coagulation profile No results for input(s): INR, PROTIME in the last 168 hours.  CBC: Recent Labs  Lab 07/27/21 1404 07/28/21 0415  WBC 64.5* 72.3*  NEUTROABS 0.6*  --   HGB 10.4* 9.2*  HCT 33.0* 29.8*  MCV 85.7 85.4  PLT 53* 48*   Cardiac Enzymes: No results for input(s): CKTOTAL, CKMB, CKMBINDEX, TROPONINI in the last 168 hours. BNP (last 3 results) No results for input(s): PROBNP in the last 8760 hours. CBG: Recent Labs  Lab 07/27/21 2217 07/28/21 0539 07/28/21 0744  GLUCAP 159* 145* 150*   D-Dimer: No results for input(s): DDIMER in the last 72 hours. Hgb A1c: Recent Labs    07/27/21 1829  HGBA1C 6.1*   Lipid Profile: No results for input(s): CHOL, HDL, LDLCALC, TRIG, CHOLHDL, LDLDIRECT in the last 72 hours. Thyroid function studies: No results for input(s): TSH, T4TOTAL, T3FREE, THYROIDAB in the last 72 hours.  Invalid input(s): FREET3 Anemia work up: No results for input(s): VITAMINB12, FOLATE, FERRITIN, TIBC, IRON, RETICCTPCT in the last 72 hours. Sepsis Labs: Recent Labs  Lab 07/27/21 1404 07/27/21 1841 07/28/21 0415  PROCALCITON  --   --  0.71  WBC 64.5*  --  72.3*  LATICACIDVEN 1.6 1.1  --     Microbiology Recent Results (from the past 240 hour(s))  Resp Panel by RT-PCR (Flu A&B, Covid) Nasopharyngeal Swab     Status: Abnormal   Collection Time: 07/27/21  6:32 PM   Specimen: Nasopharyngeal Swab; Nasopharyngeal(NP) swabs in vial transport medium  Result Value Ref Range Status   SARS Coronavirus 2 by RT PCR POSITIVE (A) NEGATIVE Final    Comment: RESULT CALLED TO, READ BACK BY AND VERIFIED WITH: D HARRIS RN 1951 07/27/21 A BROWNING (NOTE) SARS-CoV-2 target nucleic acids are DETECTED.  The SARS-CoV-2 RNA is generally detectable in upper respiratory specimens during the acute phase of infection. Positive results are indicative of the presence of the identified virus, but do not rule out bacterial infection or co-infection  with other pathogens not detected by the test. Clinical correlation with patient history and other diagnostic information is necessary to determine patient infection status. The expected result is Negative.  Fact Sheet for Patients: EntrepreneurPulse.com.au  Fact Sheet for Healthcare Providers: IncredibleEmployment.be  This test is not yet approved or cleared by the [place] FDA and  has been authorized for detection and/or diagnosis of SARS-CoV-2 by FDA under an Emergency Use Authorization (EUA).  This EUA will remain in effect (meaning this test can  be used) for the duration of  the COVID-19 declaration under Section 564(b)(1) of the Act, 21 U.S.C. section 360bbb-3(b)(1), unless the authorization is terminated or revoked sooner.     Influenza A by PCR NEGATIVE NEGATIVE Final   Influenza B by PCR NEGATIVE NEGATIVE Final    Comment: (NOTE) The Xpert Xpress SARS-CoV-2/FLU/RSV plus assay is intended as an aid  in the diagnosis of influenza from Nasopharyngeal swab specimens and should not be used as a sole basis for treatment. Nasal washings and aspirates are unacceptable for Xpert Xpress SARS-CoV-2/FLU/RSV testing.  Fact Sheet for Patients: EntrepreneurPulse.com.au  Fact Sheet for Healthcare Providers: IncredibleEmployment.be  This test is not yet approved or cleared by the [place] FDA and has been authorized for detection and/or diagnosis of SARS-CoV-2 by FDA under an Emergency Use Authorization (EUA). This EUA will remain in effect (meaning this test can be used) for the duration of the COVID-19 declaration under Section 564(b)(1) of the Act, 21 U.S.C. section 360bbb-3(b)(1), unless the authorization is terminated or revoked.  Performed at [facility], [place] [address], [place], [place] [address]     Procedures and diagnostic studies:  DG Chest 2 View  Result Date:  07/27/2021 CLINICAL DATA:  Dialysis patient.  Chest pain.  Weakness EXAM: CHEST - 2 VIEW COMPARISON:  06/26/2021 FINDINGS: The cardiac silhouette is chronically enlarged. There is aortic atherosclerosis. There is pulmonary venous hypertension with interstitial edema. There is a left effusion with left lower lobe volume loss, worsened since 1 month ago. IMPRESSION: Congestive heart failure with interstitial pulmonary edema. Worsened/enlarged left effusion with left base volume loss. Electronically Signed   By: Nelson Chimes M.D.   On: 07/27/2021 14:43   CT HEAD WO CONTRAST (5MM)  Result Date: 07/27/2021 CLINICAL DATA:  Delirium.  Weakness. EXAM: CT HEAD WITHOUT CONTRAST TECHNIQUE: Contiguous axial images were obtained from the base of the skull through the vertex without intravenous contrast. COMPARISON:  06/26/2021 FINDINGS: Brain: No evidence of acute infarction, hemorrhage, hydrocephalus, extra-axial collection or mass lesion/mass effect. There is mild diffuse low-attenuation within the subcortical and periventricular white matter compatible with chronic microvascular disease. 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 Prominence of the sulci and ventricles consistent with brain atrophy. Vascular: No hyperdense vessel or unexpected calcification. Skull: Normal. Negative for fracture or focal lesion. Sinuses/Orbits: Marked mucosal thickening is noted involving the sphenoid sinus, frontal sinus and bilateral maxillary sinuses. Opacification of the ethmoid air cells noted. Bilateral mastoid air cell effusions Other: None. IMPRESSION: 1. No acute intracranial abnormalities. 2. 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 3. Chronic sinus inflammation. 4. Bilateral mastoid air cell effusions. Electronically Signed   By: Kerby Moors M.D.   On: 07/27/2021 15:11   CT CHEST WO CONTRAST  Result Date: 07/27/2021 CLINICAL  DATA:  Pleural effusion EXAM: CT CHEST WITHOUT CONTRAST TECHNIQUE: Multidetector CT imaging of the chest was performed following the standard protocol without IV contrast. COMPARISON:  06/27/2021 FINDINGS: Cardiovascular: Extensive multi-vessel coronary artery calcification. Global cardiac size is within normal limits. Trace pericardial effusion. Central pulmonary arteries are enlarged in keeping with changes of pulmonary arterial hypertension. Moderate atherosclerotic calcification within the thoracic aorta. No aortic aneurysm. Mediastinum/Nodes: Shotty bilateral axillary adenopathy is unchanged with the index left axillary lymph node measuring 11 mm in short axis diameter at axial image # 29/3. No new pathologic thoracic adenopathy. Visualized thyroid unremarkable. The esophagus is unremarkable. Lungs/Pleura: There is progressive peribronchial and centrilobular pulmonary infiltrate within the a right lung as well as progressive diffuse bronchial wall thickening in keeping with changes of multifocal bronchopneumonia in the appropriate clinical setting. Scattered areas of airway impaction are noted within the lung bases bilaterally with complete impaction of the left lower lobar bronchus and complete collapse of the left lower lobe. This appears similar to prior examination. Right middle  lobe collapse has improved. Trace associated left pleural effusion is stable. No pneumothorax. Upper Abdomen: Splenomegaly is partially visualized, but appears stable no acute abnormality. Musculoskeletal: No acute bone abnormality. No lytic or blastic bone lesion. IMPRESSION: Progressive diffuse bronchial wall thickening and development of a asymmetric multifocal pulmonary infiltrate in keeping with progressive changes of multifocal bronchopneumonia in the appropriate clinical setting. Multifocal airway impaction at the visualized lung bases with complete impaction of the left lower lobar pulmonary bronchus and complete left lower  lobe collapse, unchanged. Interval improvement in right middle lobe collapse. Stable trace left pleural effusion. Extensive coronary artery calcification. Morphologic changes in keeping with pulmonary arterial hypertension. Stable shotty bilateral axillary adenopathy, nonspecific. Aortic Atherosclerosis (ICD10-I70.0). Electronically Signed   By: Fidela Salisbury M.D.   On: 07/27/2021 20:09    Medications:    atorvastatin  40 mg Oral Daily   carvedilol  25 mg Oral BID WC   hydrALAZINE  50 mg Oral Q8H   hydrocortisone-pramoxine  1 applicator Rectal TID   insulin aspart  0-6 Units Subcutaneous TID WC   levETIRAcetam  250 mg Oral Q T,Th,Sat-1800   levETIRAcetam  500 mg Oral Daily   loratadine  10 mg Oral Daily   pantoprazole  40 mg Oral BID   Continuous Infusions:  ceFEPime (MAXIPIME) IV Stopped (07/27/21 2242)     LOS: 1 day   Geradine Girt  [facility]   How to contact the [facility] Attending or Consulting provider [place] or covering provider during after hours [place], for this patient?  Check the care team in [facility] and look for a) attending/consulting [facility] provider listed and b) the [facility] team listed Log into [email or web address] and use [place]'s universal password to access. If you do not have the password, please contact the hospital operator. Locate the [facility] provider you are looking for under [facility] and page to a number that you can be directly reached. If you still have difficulty reaching the provider, please page the [facility] (Director on Call) for the Hospitalists listed on amion for assistance.  07/28/2021, 8:38 AM

## 2021-07-28 NOTE — Evaluation (Signed)
Clinical/Bedside Swallow Evaluation Patient Details  Name: Christine Gibson MRN: [id number] Date of Birth: 1951/02/11  Today's Date: 07/28/2021 Time: SLP Start Time (ACUTE ONLY): 0856 SLP Stop Time (ACUTE ONLY): 0912 SLP Time Calculation (min) (ACUTE ONLY): 16 min  Past Medical History:  Past Medical History:  Diagnosis Date   Chronic kidney disease    Diabetes mellitus without complication ([place])    ESRD (end stage renal disease) ([place])    Clinician notes   History of leukemia    Hypertension    Past Surgical History:  Past Surgical History:  Procedure Laterality Date   ABDOMINAL HYSTERECTOMY  1991   partial   CESAREAN SECTION     x 3   HPI:  Pt is a 70 y.o. female who was sent from dialysis center for change of mentation. CT head negative. CT chest: multifocal pulmonary infiltrate in keeping with progressive changes of multifocal bronchopneumonia. PMH: CLL, ESRD on HD TTS, HTN, IDDM, chronic combined systolic and diastolic CHF LVEF 03% and grade 1 diastolic dysfunction on echo July 2022, recent stroke in July 2022, seizure disorder September 2022, severe protein calorie malnutrition. MBS 06/28/21: mild dysphagia and swallow function impacted by cognition. No aspiration noted and pt ultimated discharged from SLP services on 9/14 on a dyspahgia 3 diet and thin liquids when it was reported to SLP that pt's family decided on comfort care.    Assessment / Plan / Recommendation  Clinical Impression  Pt was seen for bedside swallow evaluation. She did not communicate verbally, but vocalized intermittently. Pt's husband was contacted to assess pt's pre-admission swallow function. He reported that the pt has demonstrated a preference for softer foods recently, but had previously enjoyed all consistencies. He also reported the pt chewing on straws "when she's not fully awake". Oral mechanism exam was limited due to pt's difficulty following multiple commands; however, oral motor strength and ROM  appeared grossly [facility] and she presented with adequate, natural dentition. She tolerated all solids and liquids without signs or symptoms of physiological oropharyngeal dysphagia. However, she required cues for attention during self-feeding and staff has reported difficulty sucking from a straw. Advancement to regular texture diet was discussed with the pt's husband to maximize pt's options; however, pt's husband stated that he would rather she remain on dysphagia 3 at this time. SLP will follow briefly. SLP Visit Diagnosis: Dysphagia, unspecified (R13.10)    Aspiration Risk  Mild aspiration risk    Diet Recommendation Dysphagia 3 (Mech soft);Thin liquid   Liquid Administration via: Cup;Straw Medication Administration: Whole meds with puree (or crushed; as tolerated) Supervision: Full supervision/cueing for compensatory strategies Compensations: Minimize environmental distractions Postural Changes: Seated upright at 90 degrees    Other  Recommendations Oral Care Recommendations: Oral care BID    Recommendations for follow up therapy are one component of a multi-disciplinary discharge planning process, led by the attending physician.  Recommendations may be updated based on patient status, additional functional criteria and insurance authorization.  Follow up Recommendations        Frequency and Duration min 1 x/week  1 week       Prognosis Prognosis for Safe Diet Advancement: Good Barriers to Reach Goals: Cognitive deficits      Swallow Study   General Date of Onset: 07/27/21 HPI: Pt is a 70 y.o. female who was sent from dialysis center for change of mentation. CT head negative. CT chest: multifocal pulmonary infiltrate in keeping with progressive changes of multifocal bronchopneumonia. PMH: CLL, ESRD on HD  TTS, HTN, IDDM, chronic combined systolic and diastolic CHF LVEF 07% and grade 1 diastolic dysfunction on echo July 2022, recent stroke in July 2022, seizure disorder September  2022, severe protein calorie malnutrition. MBS 06/28/21: mild dysphagia and swallow function impacted by cognition. No aspiration noted and pt ultimated discharged from SLP services on 9/14 on a dyspahgia 3 diet and thin liquids when it was reported to SLP that pt's family decided on comfort care. Type of Study: Bedside Swallow Evaluation Previous Swallow Assessment: See HPI Diet Prior to this Study: Thin liquids;Dysphagia 3 (soft) Temperature Spikes Noted: No Respiratory Status: Nasal cannula History of Recent Intubation: No Behavior/Cognition: Alert;Cooperative;Pleasant mood Oral Cavity Assessment: Within Functional Limits Oral Care Completed by SLP: No Oral Cavity - Dentition: Adequate natural dentition Vision: Functional for self-feeding Self-Feeding Abilities: Needs assist Patient Positioning: Upright in bed;Postural control adequate for testing Baseline Vocal Quality:  (vocalization only) Volitional Cough: Cognitively unable to elicit Volitional Swallow: Able to elicit    Oral/Motor/Sensory Function Overall Oral Motor/Sensory Function:  (UTA)   Ice Chips Ice chips: Not tested   Thin Liquid Thin Liquid: Within functional limits Presentation: Straw    Nectar Thick Nectar Thick Liquid: Not tested   Honey Thick Honey Thick Liquid: Not tested   Puree Puree: Within functional limits Presentation: Spoon   Solid     Solid: Within functional limits Presentation: Early I. Hardin Negus, [place], [place] Office number [phone] Pager [phone]  Horton Marshall 07/28/2021,9:30 AM

## 2021-07-29 DIAGNOSIS — N186 End stage renal disease: Secondary | ICD-10-CM

## 2021-07-29 DIAGNOSIS — Z992 Dependence on renal dialysis: Secondary | ICD-10-CM

## 2021-07-29 LAB — PROCALCITONIN: Procalcitonin: 0.65 ng/mL

## 2021-07-29 MED ORDER — GLYCOPYRROLATE 1 MG PO TABS
1.0000 mg | ORAL_TABLET | ORAL | Status: DC | PRN
  Filled 2021-07-29: qty 1

## 2021-07-29 MED ORDER — GLYCOPYRROLATE 0.2 MG/ML IJ SOLN: 0.2000 mg | INTRAMUSCULAR | Status: DC | PRN

## 2021-07-29 MED ORDER — ACETAMINOPHEN 650 MG RE SUPP
650.0000 mg | Freq: Four times a day (QID) | RECTAL | Status: DC | PRN
Start: 2021-07-29 — End: 2021-07-31

## 2021-07-29 MED ORDER — HALOPERIDOL LACTATE 5 MG/ML IJ SOLN: 0.5000 mg | INTRAMUSCULAR | Status: DC | PRN

## 2021-07-29 MED ORDER — HALOPERIDOL LACTATE 2 MG/ML PO CONC
0.5000 mg | ORAL | Status: DC | PRN
  Filled 2021-07-29: qty 0.3

## 2021-07-29 MED ORDER — POLYVINYL ALCOHOL 1.4 % OP SOLN: 1.0000 [drp] | Freq: Four times a day (QID) | OPHTHALMIC | Status: DC | PRN

## 2021-07-29 MED ORDER — HALOPERIDOL 0.5 MG PO TABS
0.5000 mg | ORAL_TABLET | ORAL | Status: DC | PRN
  Filled 2021-07-29: qty 1

## 2021-07-29 MED ORDER — BIOTENE DRY MOUTH MT LIQD: 15.0000 mL | OROMUCOSAL | Status: DC | PRN

## 2021-07-29 MED ORDER — LORAZEPAM 2 MG/ML IJ SOLN
0.5000 mg | INTRAMUSCULAR | Status: DC | PRN
  Administered 2021-07-31: 1 mg via INTRAVENOUS
  Filled 2021-07-29: qty 1

## 2021-07-29 MED ORDER — FENTANYL CITRATE PF 50 MCG/ML IJ SOSY
25.0000 ug | PREFILLED_SYRINGE | INTRAMUSCULAR | Status: DC | PRN
  Administered 2021-07-29: 50 ug via INTRAVENOUS
  Administered 2021-07-29: 25 ug via INTRAVENOUS
  Administered 2021-07-30 (×5): 50 ug via INTRAVENOUS
  Administered 2021-07-31: 25 ug via INTRAVENOUS
  Administered 2021-07-31: 50 ug via INTRAVENOUS
  Administered 2021-07-31: 25 ug via INTRAVENOUS
  Administered 2021-07-31: 50 ug via INTRAVENOUS
  Filled 2021-07-29 (×12): qty 1

## 2021-07-29 MED ORDER — ACETAMINOPHEN 325 MG PO TABS: 650.0000 mg | ORAL_TABLET | Freq: Four times a day (QID) | ORAL | Status: DC | PRN

## 2021-07-29 NOTE — Progress Notes (Signed)
Progress Note    Christine Gibson  NUU:[id number] DOB: 1950-11-18  DOA: 07/27/2021 PCP: Michela Pitcher, NP    Brief Narrative:    Medical records reviewed and are as summarized below:  Christine Gibson is an 70 y.o. female with medical history significant of CLL, ESRD on HD TTS, HTN, IDDM, chronic combined systolic and diastolic CHF LVEF 34% and grade 1 diastolic dysfunction on echo July 2022, recent stroke in July 2022, seizure disorder September 2022, severe protein calorie malnutrition who was sent from dialysis center for change of mentation.   Assessment/Plan:   Active Problems:   ESRD needing dialysis ([place])   Acute encephalopathy   Encephalopathy    Acute metabolic encephalopathy Acute hypoxic respiratory failure, likely secondary to acute on chronic decompensated combined systolic and diastolic CHF  Neutropenia CLL with worsening of lymphocytosis -Patient used to follow-up with [facility], but off Zanubrutinib and then switched to hospice.  Husband has reservations about hospice due to prior experience    ESRD on HD  HTN Seizure disorder IDDM -transition to hospice  Family Communication/Anticipated D/C date and plan/Code Status    Code Status: DNR Disposition Plan: Status is: Inpatient  Remains inpatient appropriate because:Inpatient level of care appropriate due to severity of illness  Dispo: The patient is from: SNF              Anticipated d/c is to: residential hospice       Medical Consultants:   Palliative care Nephrology   Subjective:   Plan to transition to comfort care/hospice  Objective:    Vitals:   07/28/21 2336 07/29/21 0400 07/29/21 0425 07/29/21 0605  BP: 129/67 (!) 163/73  (!) 158/59  Pulse: 78 89    Resp: 15 (!) 21    Temp: 98.4 F (36.9 C) 98 F (36.7 C)    TempSrc: Axillary Axillary    SpO2: 91% 90%    Weight:   46.1 kg    No intake or output data in the 24 hours ending 07/29/21 0814 Filed Weights   07/28/21  0841 07/29/21 0425  Weight: 38.6 kg 46.1 kg    Exam: In bed, frail  Data Reviewed:   I have personally reviewed following labs and imaging studies:  Labs: Labs show the following:   Basic Metabolic Panel: Recent Labs  Lab 07/27/21 1404 07/28/21 0415  NA 139 136  K 3.8 4.0  CL 93* 93*  CO2 35* 32  GLUCOSE 169* 164*  BUN 30* 38*  CREATININE 3.72* 4.40*  CALCIUM 8.8* 8.7*   GFR Estimated Creatinine Clearance: 8 mL/min (A) (by C-G formula based on SCr of 4.4 mg/dL (H)). Liver Function Tests: Recent Labs  Lab 07/27/21 1404  AST 28  ALT 15  ALKPHOS 99  BILITOT 0.6  PROT 5.0*  ALBUMIN 2.6*   No results for input(s): LIPASE, AMYLASE in the last 168 hours. No results for input(s): AMMONIA in the last 168 hours. Coagulation profile No results for input(s): INR, PROTIME in the last 168 hours.  CBC: Recent Labs  Lab 07/27/21 1404 07/28/21 0415  WBC 64.5* 72.3*  NEUTROABS 0.6*  --   HGB 10.4* 9.2*  HCT 33.0* 29.8*  MCV 85.7 85.4  PLT 53* 48*   Cardiac Enzymes: No results for input(s): CKTOTAL, CKMB, CKMBINDEX, TROPONINI in the last 168 hours. BNP (last 3 results) No results for input(s): PROBNP in the last 8760 hours. CBG: Recent Labs  Lab 07/28/21 0539 07/28/21 0744 07/28/21 1151 07/28/21  1600 07/28/21 2134  GLUCAP 145* 150* 169* 120* 191*   D-Dimer: No results for input(s): DDIMER in the last 72 hours. Hgb A1c: Recent Labs    07/27/21 1829  HGBA1C 6.1*   Lipid Profile: No results for input(s): CHOL, HDL, LDLCALC, TRIG, CHOLHDL, LDLDIRECT in the last 72 hours. Thyroid function studies: No results for input(s): TSH, T4TOTAL, T3FREE, THYROIDAB in the last 72 hours.  Invalid input(s): FREET3 Anemia work up: No results for input(s): VITAMINB12, FOLATE, FERRITIN, TIBC, IRON, RETICCTPCT in the last 72 hours. Sepsis Labs: Recent Labs  Lab 07/27/21 1404 07/27/21 1841 07/28/21 0415 07/29/21 0107  PROCALCITON  --   --  0.71 0.65  WBC 64.5*  --   72.3*  --   LATICACIDVEN 1.6 1.1  --   --     Microbiology Recent Results (from the past 240 hour(s))  Blood culture (routine x 2)     Status: None (Preliminary result)   Collection Time: 07/27/21  2:04 PM   Specimen: Site Not Specified; Blood  Result Value Ref Range Status   Specimen Description SITE NOT SPECIFIED  Final   Special Requests   Final    BOTTLES DRAWN AEROBIC AND ANAEROBIC Blood Culture adequate volume   Culture   Final    NO GROWTH < 24 HOURS Performed at [facility], [address], [place], [place] [address]    Report Status PENDING  Incomplete  Resp Panel by RT-PCR (Flu A&B, Covid) Nasopharyngeal Swab     Status: Abnormal   Collection Time: 07/27/21  6:32 PM   Specimen: Nasopharyngeal Swab; Nasopharyngeal(NP) swabs in vial transport medium  Result Value Ref Range Status   SARS Coronavirus 2 by RT PCR POSITIVE (A) NEGATIVE Final    Comment: RESULT CALLED TO, READ BACK BY AND VERIFIED WITH: D HARRIS RN 1951 07/27/21 A BROWNING (NOTE) SARS-CoV-2 target nucleic acids are DETECTED.  The SARS-CoV-2 RNA is generally detectable in upper respiratory specimens during the acute phase of infection. Positive results are indicative of the presence of the identified virus, but do not rule out bacterial infection or co-infection with other pathogens not detected by the test. Clinical correlation with patient history and other diagnostic information is necessary to determine patient infection status. The expected result is Negative.  Fact Sheet for Patients: EntrepreneurPulse.com.au  Fact Sheet for Healthcare Providers: IncredibleEmployment.be  This test is not yet approved or cleared by the [place] FDA and  has been authorized for detection and/or diagnosis of SARS-CoV-2 by FDA under an Emergency Use Authorization (EUA).  This EUA will remain in effect (meaning this test can  be used) for the duration of  the COVID-19  declaration under Section 564(b)(1) of the Act, 21 U.S.C. section 360bbb-3(b)(1), unless the authorization is terminated or revoked sooner.     Influenza A by PCR NEGATIVE NEGATIVE Final   Influenza B by PCR NEGATIVE NEGATIVE Final    Comment: (NOTE) The Xpert Xpress SARS-CoV-2/FLU/RSV plus assay is intended as an aid in the diagnosis of influenza from Nasopharyngeal swab specimens and should not be used as a sole basis for treatment. Nasal washings and aspirates are unacceptable for Xpert Xpress SARS-CoV-2/FLU/RSV testing.  Fact Sheet for Patients: EntrepreneurPulse.com.au  Fact Sheet for Healthcare Providers: IncredibleEmployment.be  This test is not yet approved or cleared by the [place] FDA and has been authorized for detection and/or diagnosis of SARS-CoV-2 by FDA under an Emergency Use Authorization (EUA). This EUA will remain in effect (meaning this test  can be used) for the duration of the COVID-19 declaration under Section 564(b)(1) of the Act, 21 U.S.C. section 360bbb-3(b)(1), unless the authorization is terminated or revoked.  Performed at [facility], [place] [address] [place], [place] [address]   Blood culture (routine x 2)     Status: None (Preliminary result)   Collection Time: 07/28/21  4:15 AM   Specimen: BLOOD RIGHT HAND  Result Value Ref Range Status   Specimen Description BLOOD RIGHT HAND  Final   Special Requests   Final    BOTTLES DRAWN AEROBIC AND ANAEROBIC Blood Culture adequate volume   Culture   Final    NO GROWTH < 12 HOURS Performed at [facility], [place] [address], [place], [place] [address]    Report Status PENDING  Incomplete    Procedures and diagnostic studies:  DG Chest 2 View  Result Date: 07/27/2021 CLINICAL DATA:  Dialysis patient.  Chest pain.  Weakness EXAM: CHEST - 2 VIEW COMPARISON:  06/26/2021 FINDINGS: The cardiac silhouette is chronically enlarged. There is aortic  atherosclerosis. There is pulmonary venous hypertension with interstitial edema. There is a left effusion with left lower lobe volume loss, worsened since 1 month ago. IMPRESSION: Congestive heart failure with interstitial pulmonary edema. Worsened/enlarged left effusion with left base volume loss. Electronically Signed   By: Nelson Chimes M.D.   On: 07/27/2021 14:43   CT HEAD WO CONTRAST (5MM)  Result Date: 07/27/2021 CLINICAL DATA:  Delirium.  Weakness. EXAM: CT HEAD WITHOUT CONTRAST TECHNIQUE: Contiguous axial images were obtained from the base of the skull through the vertex without intravenous contrast. COMPARISON:  06/26/2021 FINDINGS: Brain: No evidence of acute infarction, hemorrhage, hydrocephalus, extra-axial collection or mass lesion/mass effect. There is mild diffuse low-attenuation within the subcortical and periventricular white matter compatible with chronic microvascular disease. 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 Prominence of the sulci and ventricles consistent with brain atrophy. Vascular: No hyperdense vessel or unexpected calcification. Skull: Normal. Negative for fracture or focal lesion. Sinuses/Orbits: Marked mucosal thickening is noted involving the sphenoid sinus, frontal sinus and bilateral maxillary sinuses. Opacification of the ethmoid air cells noted. Bilateral mastoid air cell effusions Other: None. IMPRESSION: 1. No acute intracranial abnormalities. 2. 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 3. Chronic sinus inflammation. 4. Bilateral mastoid air cell effusions. Electronically Signed   By: Kerby Moors M.D.   On: 07/27/2021 15:11   CT CHEST WO CONTRAST  Result Date: 07/27/2021 CLINICAL DATA:  Pleural effusion EXAM: CT CHEST WITHOUT CONTRAST TECHNIQUE: Multidetector CT imaging of the chest was performed following the standard protocol without IV contrast. COMPARISON:   06/27/2021 FINDINGS: Cardiovascular: Extensive multi-vessel coronary artery calcification. Global cardiac size is within normal limits. Trace pericardial effusion. Central pulmonary arteries are enlarged in keeping with changes of pulmonary arterial hypertension. Moderate atherosclerotic calcification within the thoracic aorta. No aortic aneurysm. Mediastinum/Nodes: Shotty bilateral axillary adenopathy is unchanged with the index left axillary lymph node measuring 11 mm in short axis diameter at axial image # 29/3. No new pathologic thoracic adenopathy. Visualized thyroid unremarkable. The esophagus is unremarkable. Lungs/Pleura: There is progressive peribronchial and centrilobular pulmonary infiltrate within the a right lung as well as progressive diffuse bronchial wall thickening in keeping with changes of multifocal bronchopneumonia in the appropriate clinical setting. Scattered areas of airway impaction are noted within the lung bases bilaterally with complete impaction of the left lower lobar bronchus and complete collapse of  the left lower lobe. This appears similar to prior examination. Right middle lobe collapse has improved. Trace associated left pleural effusion is stable. No pneumothorax. Upper Abdomen: Splenomegaly is partially visualized, but appears stable no acute abnormality. Musculoskeletal: No acute bone abnormality. No lytic or blastic bone lesion. IMPRESSION: Progressive diffuse bronchial wall thickening and development of a asymmetric multifocal pulmonary infiltrate in keeping with progressive changes of multifocal bronchopneumonia in the appropriate clinical setting. Multifocal airway impaction at the visualized lung bases with complete impaction of the left lower lobar pulmonary bronchus and complete left lower lobe collapse, unchanged. Interval improvement in right middle lobe collapse. Stable trace left pleural effusion. Extensive coronary artery calcification. Morphologic changes in keeping  with pulmonary arterial hypertension. Stable shotty bilateral axillary adenopathy, nonspecific. Aortic Atherosclerosis (ICD10-I70.0). Electronically Signed   By: Fidela Salisbury M.D.   On: 07/27/2021 20:09    Medications:    acetaminophen  1,000 mg Oral TID   atorvastatin  40 mg Oral Daily   carvedilol  25 mg Oral BID WC   hydrALAZINE  50 mg Oral Q8H   hydrocortisone-pramoxine  1 applicator Rectal TID   insulin aspart  0-6 Units Subcutaneous TID WC   levETIRAcetam  250 mg Oral Q T,Th,Sat-1800   levETIRAcetam  500 mg Oral Daily   loratadine  10 mg Oral Daily   pantoprazole  40 mg Oral BID   sertraline  50 mg Oral Daily   Continuous Infusions:  ceFEPime (MAXIPIME) IV 1 g (07/28/21 2224)     LOS: 2 days   Geradine Girt  [facility]   How to contact the [facility] Attending or Consulting provider [place] or covering provider during after hours [place], for this patient?  Check the care team in [facility] and look for a) attending/consulting TR[facility] provider listed and b) the [facility] team listed Log into [email or web address] and use [place]'s universal password to access. If you do not have the password, please contact the hospital operator. Locate the [facility] provider you are looking for under [facility] and page to a number that you can be directly reached. If you still have difficulty reaching the provider, please page the [facility] (Director on Call) for the Hospitalists listed on amion for assistance.  07/29/2021, 8:14 AM

## 2021-07-29 NOTE — Progress Notes (Signed)
[facility] Progress Note  Subjective: seen in room, no new c/o  Vitals:   07/29/21 0425 07/29/21 0600 07/29/21 0605 07/29/21 0823  BP:   (!) 158/59 (!) 157/57  Pulse:  84  80  Resp:  15  16  Temp:      TempSrc:      SpO2:  94%  95%  Weight: 46.1 kg       Exam: General: Frail, ^ WOB, confused Lungs: Reduced air movement in B bases Heart: RRR with normal S1, S2.  Abdomen: Soft, non-tender, non-distended  Lower extremities: No edema Neuro: Awake, confused. Dialysis Access: AVG    TTS at [facility] 3:30hr, 400/500, EDW 39kg, 2K/2Ca, AVG, no heparin - Hectoral 33mcg IV q HD   Assessment/Plan:  Hypoxia: Stable on nasal O2  ESRD: no longer a HD candidate due to progressive decline primarily related to untreatable CLL and long-term COVID infection. Plan is for transition to comfort care. Appreciate palliative care assistance. Will sign off.   Anemia: Hgb 9.2   Metabolic bone disease: Ca ok, follow.  CLL: Chronic leukocytosis/thrombocytopenia.   [name], MD 07/29/2021, 12:06 PM     Recent Labs  Lab 07/27/21 1404 07/28/21 0415  K 3.8 4.0  BUN 30* 38*  CREATININE 3.72* 4.40*  CALCIUM 8.8* 8.7*  HGB 10.4* 9.2*   Inpatient medications:  acetaminophen  1,000 mg Oral TID   hydrocortisone-pramoxine  1 applicator Rectal TID   levETIRAcetam  250 mg Oral Q T,Th,Sat-1800   levETIRAcetam  500 mg Oral Daily   sertraline  50 mg Oral Daily    acetaminophen **OR** acetaminophen, albuterol, antiseptic oral rinse, fentaNYL (SUBLIMAZE) injection, glycopyrrolate **OR** glycopyrrolate **OR** glycopyrrolate, haloperidol **OR** haloperidol **OR** haloperidol lactate, LORazepam, melatonin, ondansetron **OR** ondansetron (ZOFRAN) IV, oxyCODONE, polyvinyl alcohol

## 2021-07-29 NOTE — Progress Notes (Signed)
Palliative Medicine Inpatient Follow Up Note   Consulting Provider: Lequita Halt, MD   Reason for consult:   [place] Palliative Medicine Consult  Reason for Consult? Hospice    HPI:  Per intake H&P --> Christine Gibson is an 70 y.o. female with medical history significant of CLL, ESRD on HD TTS, HTN, IDDM, chronic combined systolic and diastolic CHF LVEF 48% and grade 1 diastolic dysfunction on echo July 2022, recent stroke in July 2022, seizure disorder September 2022, severe protein calorie malnutrition who was sent from dialysis center for change of mentation.   Palliative care was asked to get involved in the setting of multiple comorbid conditions and recurrent hospitalizations.  Palliative care had seen and worked with Christine Gibson extensively  Today's Discussion (07/29/2021):  *Please note that this is a verbal dictation therefore any spelling or grammatical errors are due to the "[place] [employer]" system interpretation.  Chart reviewed.  I met with patient's bedside RN this morning who had shared that Christine Gibson had received pain medication consistently which does appear to be helping her overall.   Upon assessment of Christine Gibson this morning she is somnolent though arousable.  She does not endorse any discomfort.  Her nurse and I were able to reposition her in bed.  I called patient's spouse, Christine Gibson this morning.  I shared with him that per my conversation with the nephrology team last evening it does not seem at this point like dialysis will be of benefit to Christine Gibson any longer.  I shared that per my understanding this is an intervention that we will no longer be offered.  Christine Gibson expressed a degree of disappointment sharing, "I have no choice to make, the choices been made for me".  I shared that I believe Christine Gibson's body has made the choice in terms of what I can and can no longer tolerate.  We reviewed that in her progressively frail state ongoing dialysis will not be  something that will aid in symptom relief.  I shared that allowing the medical team to make choices comfortable as possible would be the appropriate next step.  Christine Gibson was in agreement with giving medications to help with pain and stopping additional interventions of polypharmacy and needlesticks.  We reviewed the plan for transition to inpatient hospice at [facility] once a bed is identified.  I offered palliative support for Christine Gibson as he shares with me that Christine Gibson is all he has.  He states that she does have 3 sons but they are in different locations throughout the [place] and not present.  I offered therapeutic support through reflective listening.  Shortly after speaking with Christine Gibson I was able to reach out to the primary medical team as well as the nursing staff to inform them of the changes that will be made to patient's care plan.  Questions and concerns addressed   Objective Assessment: Vital Signs Vitals:   07/29/21 0605 07/29/21 0823  BP: (!) 158/59 (!) 157/57  Pulse:  80  Resp:  16  Temp:    SpO2:  95%   No intake or output data in the 24 hours ending 07/29/21 0918 Last Weight  Most recent update: 07/29/2021  4:27 AM    Weight  46.1 kg (101 lb 10.1 oz)            Gen: Very frail elderly African-American female in moderate distress HEENT: Dry mucous membranes CV: Regular rate and rhythm PULM: clear to auscultation bilaterally ABD: soft/non-tender EXT:  No edema Neuro: Alert and oriented to self  SUMMARY OF RECOMMENDATIONS   DNAR/DNI  Per conversations with the nephrology team Christine Gibson is no longer a candidate for hemodialysis  Goals for comfort oriented care with low doses of medication to aid in symptom relief  Unrestricted visitation  I appreciate transitions of care team to help support getting patient to [facility]  Ongoing palliative care symptom management  Time Spent: 49 Greater than 50% of the time was spent in counseling and coordination of  care ______________________________________________________________________________________ [place] Team Team Cell Phone: [phone] Please utilize secure chat with additional questions, if there is no response within 30 minutes please call the above phone number  Palliative Medicine Team providers are available by phone from 7am to 7pm daily and can be reached through the team cell phone.  Should this patient require assistance outside of these hours, please call the patient's attending physician.

## 2021-07-29 NOTE — Progress Notes (Signed)
Received request from [facility] for family interest in [facility]. Chart reviewed is currently under review by hospice physician and eligibility is pending at this time. Spoke with patient's husband Tymeka Privette to acknowledge referral and explain services. Unfortunately [facility] does not have a bed to offer today.   TOC is aware hospital liaison will follow up tomorrow or sooner if room becomes available.   Please do not hesitate to call with any hospice related questions or concerns.   Thank you for the opportunity to participate in this patient's care.  Jhonnie Garner, Therapist, sports, [facility] Liaison  [phone]

## 2021-07-30 NOTE — Progress Notes (Signed)
   Palliative Medicine Inpatient Follow Up Note   Consulting Provider: Lequita Halt, MD   Reason for consult:   [place] Palliative Medicine Consult  Reason for Consult? Hospice    HPI:  Per intake H&P --> Christine Gibson is an 70 y.o. female with medical history significant of CLL, ESRD on HD TTS, HTN, IDDM, chronic combined systolic and diastolic CHF LVEF 27% and grade 1 diastolic dysfunction on echo July 2022, recent stroke in July 2022, seizure disorder September 2022, severe protein calorie malnutrition who was sent from dialysis center for change of mentation.   Palliative care was asked to get involved in the setting of multiple comorbid conditions and recurrent hospitalizations.  Palliative care had seen and worked with Christine Gibson extensively  Today's Discussion (07/30/2021):  *Please note that this is a verbal dictation therefore any spelling or grammatical errors are due to the "[place] [employer]" system interpretation.  Chart reviewed. (+) 4 doses of oxycodone and (+) 3 doses of fentanyl in the last 24 hours.   On exam Christine Gibson appears comfortable and has no complaints. She is resting of 2LPM [place]. Presently she is stable for transfer to inpatient hospice at [facility] for ongoing symptom support.  Patients husband was not present upon bedside this morning though he will be called and provided with an update.  Questions and concerns addressed   Objective Assessment: Vital Signs Vitals:   07/29/21 2130 07/30/21 0700  BP:  126/82  Pulse: 72 84  Resp: (!) 21 19  Temp:  98.3 F (36.8 C)  SpO2: 91% 91%    Intake/Output Summary (Last 24 hours) at 07/30/2021 5170 Last data filed at 07/29/2021 2137 Gross per 24 hour  Intake 130 ml  Output --  Net 130 ml   Last Weight  Most recent update: 07/29/2021  4:27 AM    Weight  46.1 kg (101 lb 10.1 oz)            Gen: Very frail elderly African-American female in moderate distress HEENT: Dry mucous  membranes CV: Regular rate and rhythm PULM: clear to auscultation bilaterally ABD: soft/non-tender EXT: No edema Neuro: Alert and oriented to self  SUMMARY OF RECOMMENDATIONS   DNAR/DNI  Per conversations with the nephrology team Christine Gibson is no longer a candidate for hemodialysis  Goals for comfort oriented care with low doses of medication to aid in symptom relief  Unrestricted visitation  I appreciate transitions of care team to help support getting patient to [facility]  Ongoing palliative care symptom management  Time Spent: 15 Greater than 50% of the time was spent in counseling and coordination of care ______________________________________________________________________________________ [place] Team Team Cell Phone: [phone] Please utilize secure chat with additional questions, if there is no response within 30 minutes please call the above phone number  Palliative Medicine Team providers are available by phone from 7am to 7pm daily and can be reached through the team cell phone.  Should this patient require assistance outside of these hours, please call the patient's attending physician.

## 2021-07-30 NOTE — Progress Notes (Signed)
Progress Note    Christine Gibson  WUJ:[id number] DOB: 10-17-1951  DOA: 07/27/2021 PCP: Michela Pitcher, NP    Brief Narrative:    Medical records reviewed and are as summarized below:  Christine Gibson is an 70 y.o. female with medical history significant of CLL, ESRD on HD TTS, HTN, IDDM, chronic combined systolic and diastolic CHF LVEF 95% and grade 1 diastolic dysfunction on echo July 2022, recent stroke in July 2022, seizure disorder September 2022, severe protein calorie malnutrition who was sent from dialysis center for change of mentation.  Now has transitioned to comfort care.     Assessment/Plan:   Active Problems:   ESRD needing dialysis ([place])   Acute encephalopathy   Encephalopathy    Acute metabolic encephalopathy Acute hypoxic respiratory failure, likely secondary to acute on chronic decompensated combined systolic and diastolic CHF  Neutropenia CLL with worsening of lymphocytosis -Patient used to follow-up with [facility], but off Zanubrutinib and then switched to hospice.    ESRD on HD  HTN Seizure disorder IDDM -transition to hospice  Family Communication/Anticipated D/C date and plan/Code Status    Code Status: DNR Disposition Plan: Status is: Inpatient  Remains inpatient appropriate because:Inpatient level of care appropriate due to severity of illness  Dispo: The patient is from: SNF              Anticipated d/c is to: residential hospice       Medical Consultants:   Palliative care Nephrology   Subjective:   No overnight events-- getting pain meds as needed  Objective:    Vitals:   07/29/21 1907 07/29/21 2000 07/29/21 2130 07/30/21 0700  BP:  (!) 149/61  126/82  Pulse:  80 72 84  Resp: (!) 21 20 (!) 21 19  Temp:  98 F (36.7 C)  98.3 F (36.8 C)  TempSrc:  Axillary  Axillary  SpO2:  98% 91% 91%  Weight:        Intake/Output Summary (Last 24 hours) at 07/30/2021 1005 Last data filed at 07/29/2021 2137 Gross per 24 hour   Intake 30 ml  Output --  Net 30 ml   Filed Weights   07/28/21 0841 07/29/21 0425  Weight: 38.6 kg 46.1 kg    Exam: In bed, sleeping, appears comfortable  Data Reviewed:   I have personally reviewed following labs and imaging studies:  Labs: Labs show the following:   Basic Metabolic Panel: Recent Labs  Lab 07/27/21 1404 07/28/21 0415  NA 139 136  K 3.8 4.0  CL 93* 93*  CO2 35* 32  GLUCOSE 169* 164*  BUN 30* 38*  CREATININE 3.72* 4.40*  CALCIUM 8.8* 8.7*   GFR Estimated Creatinine Clearance: 8 mL/min (A) (by C-G formula based on SCr of 4.4 mg/dL (H)). Liver Function Tests: Recent Labs  Lab 07/27/21 1404  AST 28  ALT 15  ALKPHOS 99  BILITOT 0.6  PROT 5.0*  ALBUMIN 2.6*   No results for input(s): LIPASE, AMYLASE in the last 168 hours. No results for input(s): AMMONIA in the last 168 hours. Coagulation profile No results for input(s): INR, PROTIME in the last 168 hours.  CBC: Recent Labs  Lab 07/27/21 1404 07/28/21 0415  WBC 64.5* 72.3*  NEUTROABS 0.6*  --   HGB 10.4* 9.2*  HCT 33.0* 29.8*  MCV 85.7 85.4  PLT 53* 48*   Cardiac Enzymes: No results for input(s): CKTOTAL, CKMB, CKMBINDEX, TROPONINI in the last 168 hours. BNP (last 3 results)  No results for input(s): PROBNP in the last 8760 hours. CBG: Recent Labs  Lab 07/28/21 0539 07/28/21 0744 07/28/21 1151 07/28/21 1600 07/28/21 2134  GLUCAP 145* 150* 169* 120* 191*   D-Dimer: No results for input(s): DDIMER in the last 72 hours. Hgb A1c: Recent Labs    07/27/21 1829  HGBA1C 6.1*   Lipid Profile: No results for input(s): CHOL, HDL, LDLCALC, TRIG, CHOLHDL, LDLDIRECT in the last 72 hours. Thyroid function studies: No results for input(s): TSH, T4TOTAL, T3FREE, THYROIDAB in the last 72 hours.  Invalid input(s): FREET3 Anemia work up: No results for input(s): VITAMINB12, FOLATE, FERRITIN, TIBC, IRON, RETICCTPCT in the last 72 hours. Sepsis Labs: Recent Labs  Lab 07/27/21 1404  07/27/21 1841 07/28/21 0415 07/29/21 0107  PROCALCITON  --   --  0.71 0.65  WBC 64.5*  --  72.3*  --   LATICACIDVEN 1.6 1.1  --   --     Microbiology Recent Results (from the past 240 hour(s))  Blood culture (routine x 2)     Status: None (Preliminary result)   Collection Time: 07/27/21  2:04 PM   Specimen: Site Not Specified; Blood  Result Value Ref Range Status   Specimen Description SITE NOT SPECIFIED  Final   Special Requests   Final    BOTTLES DRAWN AEROBIC AND ANAEROBIC Blood Culture adequate volume   Culture   Final    NO GROWTH 2 DAYS Performed at [facility], [address] [place], [place] [address]    Report Status PENDING  Incomplete  Resp Panel by RT-PCR (Flu A&B, Covid) Nasopharyngeal Swab     Status: Abnormal   Collection Time: 07/27/21  6:32 PM   Specimen: Nasopharyngeal Swab; Nasopharyngeal(NP) swabs in vial transport medium  Result Value Ref Range Status   SARS Coronavirus 2 by RT PCR POSITIVE (A) NEGATIVE Final    Comment: RESULT CALLED TO, READ BACK BY AND VERIFIED WITH: D HARRIS RN 1951 07/27/21 A BROWNING (NOTE) SARS-CoV-2 target nucleic acids are DETECTED.  The SARS-CoV-2 RNA is generally detectable in upper respiratory specimens during the acute phase of infection. Positive results are indicative of the presence of the identified virus, but do not rule out bacterial infection or co-infection with other pathogens not detected by the test. Clinical correlation with patient history and other diagnostic information is necessary to determine patient infection status. The expected result is Negative.  Fact Sheet for Patients: EntrepreneurPulse.com.au  Fact Sheet for Healthcare Providers: IncredibleEmployment.be  This test is not yet approved or cleared by the [place] FDA and  has been authorized for detection and/or diagnosis of SARS-CoV-2 by FDA under an Emergency Use Authorization (EUA).  This EUA  will remain in effect (meaning this test can  be used) for the duration of  the COVID-19 declaration under Section 564(b)(1) of the Act, 21 U.S.C. section 360bbb-3(b)(1), unless the authorization is terminated or revoked sooner.     Influenza A by PCR NEGATIVE NEGATIVE Final   Influenza B by PCR NEGATIVE NEGATIVE Final    Comment: (NOTE) The Xpert Xpress SARS-CoV-2/FLU/RSV plus assay is intended as an aid in the diagnosis of influenza from Nasopharyngeal swab specimens and should not be used as a sole basis for treatment. Nasal washings and aspirates are unacceptable for Xpert Xpress SARS-CoV-2/FLU/RSV testing.  Fact Sheet for Patients: EntrepreneurPulse.com.au  Fact Sheet for Healthcare Providers: IncredibleEmployment.be  This test is not yet approved or cleared by the [place] FDA and has been authorized for detection  and/or diagnosis of SARS-CoV-2 by FDA under an Emergency Use Authorization (EUA). This EUA will remain in effect (meaning this test can be used) for the duration of the COVID-19 declaration under Section 564(b)(1) of the Act, 21 U.S.C. section 360bbb-3(b)(1), unless the authorization is terminated or revoked.  Performed at [facility], [place] [address] [place], [place] [address]   Blood culture (routine x 2)     Status: None (Preliminary result)   Collection Time: 07/28/21  4:15 AM   Specimen: BLOOD RIGHT HAND  Result Value Ref Range Status   Specimen Description BLOOD RIGHT HAND  Final   Special Requests   Final    BOTTLES DRAWN AEROBIC AND ANAEROBIC Blood Culture adequate volume   Culture   Final    NO GROWTH 1 DAY Performed at [facility], [place] [address], [place], [place] [address]    Report Status PENDING  Incomplete    Procedures and diagnostic studies:  No results found.  Medications:    acetaminophen  1,000 mg Oral TID   hydrocortisone-pramoxine  1 applicator Rectal TID   levETIRAcetam   250 mg Oral Q T,Th,Sat-1800   levETIRAcetam  500 mg Oral Daily   sertraline  50 mg Oral Daily   Continuous Infusions:     LOS: 3 days   Geradine Girt  [facility]   How to contact the [facility] Attending or Consulting provider [place] or covering provider during after hours [place], for this patient?  Check the care team in [facility] and look for a) attending/consulting [facility] provider listed and b) the [facility] team listed Log into [email or web address] and use [facility]'s universal password to access. If you do not have the password, please contact the hospital operator. Locate the [facility] provider you are looking for under [facility] and page to a number that you can be directly reached. If you still have difficulty reaching the provider, please page the [facility] (Director on Call) for the Hospitalists listed on amion for assistance.  07/30/2021, 10:05 AM

## 2021-07-30 NOTE — Progress Notes (Signed)
[facility] ([facility]) Hospital Liaison note.     This patient is approved to transfer to [facility] today.    [facility] will notify TOC when registration paperwork has been completed to arrange transport.    RN please call report to [phone].   Thank you,     Mary Anne Robertson, RN, CCM       [facility] Hospital Liaison  [phone] 

## 2021-07-30 NOTE — Progress Notes (Signed)
Manufacturing engineer [facility]) Hospital Liaison note.    Husband does not want to complete the consents for this patient until he speaks with our insurance authorization people. He has concerns regarding the room and board fee at [facility].  Our team will follow up in the morning.  Farrel Gordon, RN, [place] Hospital Liaison   [phone]

## 2021-07-31 LAB — PATHOLOGIST SMEAR REVIEW

## 2021-07-31 MED ORDER — HALOPERIDOL LACTATE 2 MG/ML PO CONC: 0.5000 mg | ORAL | 0 refills | Status: AC | PRN

## 2021-07-31 MED ORDER — FENTANYL CITRATE PF 50 MCG/ML IJ SOSY: 25.0000 ug | PREFILLED_SYRINGE | INTRAMUSCULAR | 0 refills | Status: AC | PRN

## 2021-07-31 MED ORDER — GLYCOPYRROLATE 0.2 MG/ML IJ SOLN: 0.2000 mg | INTRAMUSCULAR | Status: AC | PRN

## 2021-07-31 MED ORDER — LORAZEPAM 2 MG/ML IJ SOLN: 0.5000 mg | INTRAMUSCULAR | 0 refills | Status: AC | PRN

## 2021-07-31 NOTE — Discharge Summary (Signed)
Physician Discharge Summary  Christine Gibson KKX:[id number] DOB: 1951-04-27 DOA: 07/27/2021  PCP: Michela Pitcher, NP  Admit date: 07/27/2021 Discharge date: 07/31/2021  Admitted From: SNF Discharge disposition: hospice   Recommendations for Outpatient Follow-Up:   To residential hospice  Discharge Diagnosis:   Active Problems:   ESRD needing dialysis [facility])   Acute encephalopathy   Encephalopathy    Discharge Condition: terminal   Wound care: None.  Code status: DNR   History of Present Illness:   Christine Gibson is a 70 y.o. female with medical history significant of CLL, ESRD on HD TTS, HTN, IDDM, chronic combined systolic and diastolic CHF LVEF 16% and grade 1 diastolic dysfunction on echo July 2022, recent stroke in July 2022, seizure disorder September 2022, severe protein calorie malnutrition who was sent from dialysis center for change of mentation.   Patient is currently confused, unable to provide any history, most history provided by ED staff and reviewing of nursing home papers.  Reportedly, patient developed confusion and complain about chest pain during dialysis, and it is unknown how much time patient had HD today.   Patient however is very confused and unable confirm whether she had chest pain earlier.  ED work-up showed troponin negative x2, EKG showed chronic LVH and nonspecific ST changes.  X-ray showed worsening of left lung infiltrates versus worsening of left-sided pleural effusion.  ED also found patient had a new onset of oxygen amount, and stabilized on 3 L.  And patient still tachypneic. BP stable and no tachycardia.   Hospital Course by Problem:   Acute metabolic encephalopathy Acute hypoxic respiratory failure, likely secondary to acute on chronic decompensated combined systolic and diastolic CHF  Neutropenia CLL with worsening of lymphocytosis -Patient used to follow-up with [facility], but off Zanubrutinib and then switched to  hospice.    ESRD on HD  HTN Seizure disorder IDDM -transition to hospice    Medical Consultants:    Renal Palliative care  Discharge Exam:   Vitals:   07/30/21 1945 07/31/21 1023  BP: (!) 165/79 (!) 177/82  Pulse: 74 78  Resp: 14 14  Temp: 98.5 F (36.9 C) 98.3 F (36.8 C)  SpO2: 97% 93%   Vitals:   07/29/21 2130 07/30/21 0700 07/30/21 1945 07/31/21 1023  BP:  126/82 (!) 165/79 (!) 177/82  Pulse: 72 84 74 78  Resp: (!) 21 19 14 14   Temp:  98.3 F (36.8 C) 98.5 F (36.9 C) 98.3 F (36.8 C)  TempSrc:  Axillary Axillary Oral  SpO2: 91% 91% 97% 93%  Weight:        General exam: Appears calm and comfortable.  The results of significant diagnostics from this hospitalization (including imaging, microbiology, ancillary and laboratory) are listed below for reference.     Procedures and Diagnostic Studies:   DG Chest 2 View  Result Date: 07/27/2021 CLINICAL DATA:  Dialysis patient.  Chest pain.  Weakness EXAM: CHEST - 2 VIEW COMPARISON:  06/26/2021 FINDINGS: The cardiac silhouette is chronically enlarged. There is aortic atherosclerosis. There is pulmonary venous hypertension with interstitial edema. There is a left effusion with left lower lobe volume loss, worsened since 1 month ago. IMPRESSION: Congestive heart failure with interstitial pulmonary edema. Worsened/enlarged left effusion with left base volume loss. Electronically Signed   By: Nelson Chimes M.D.   On: 07/27/2021 14:43   CT HEAD WO CONTRAST (5MM)  Result Date: 07/27/2021 CLINICAL DATA:  Delirium.  Weakness. EXAM: CT HEAD  WITHOUT CONTRAST TECHNIQUE: Contiguous axial images were obtained from the base of the skull through the vertex without intravenous contrast. COMPARISON:  06/26/2021 FINDINGS: Brain: No evidence of acute infarction, hemorrhage, hydrocephalus, extra-axial collection or mass lesion/mass effect. There is mild diffuse low-attenuation within the subcortical and periventricular white matter  compatible with chronic microvascular disease. 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 Prominence of the sulci and ventricles consistent with brain atrophy. Vascular: No hyperdense vessel or unexpected calcification. Skull: Normal. Negative for fracture or focal lesion. Sinuses/Orbits: Marked mucosal thickening is noted involving the sphenoid sinus, frontal sinus and bilateral maxillary sinuses. Opacification of the ethmoid air cells noted. Bilateral mastoid air cell effusions Other: None. IMPRESSION: 1. No acute intracranial abnormalities. 2. 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 3. Chronic sinus inflammation. 4. Bilateral mastoid air cell effusions. Electronically Signed   By: Kerby Moors M.D.   On: 07/27/2021 15:11   CT CHEST WO CONTRAST  Result Date: 07/27/2021 CLINICAL DATA:  Pleural effusion EXAM: CT CHEST WITHOUT CONTRAST TECHNIQUE: Multidetector CT imaging of the chest was performed following the standard protocol without IV contrast. COMPARISON:  06/27/2021 FINDINGS: Cardiovascular: Extensive multi-vessel coronary artery calcification. Global cardiac size is within normal limits. Trace pericardial effusion. Central pulmonary arteries are enlarged in keeping with changes of pulmonary arterial hypertension. Moderate atherosclerotic calcification within the thoracic aorta. No aortic aneurysm. Mediastinum/Nodes: Shotty bilateral axillary adenopathy is unchanged with the index left axillary lymph node measuring 11 mm in short axis diameter at axial image # 29/3. No new pathologic thoracic adenopathy. Visualized thyroid unremarkable. The esophagus is unremarkable. Lungs/Pleura: There is progressive peribronchial and centrilobular pulmonary infiltrate within the a right lung as well as progressive diffuse bronchial wall thickening in keeping with changes of multifocal bronchopneumonia in the appropriate  clinical setting. Scattered areas of airway impaction are noted within the lung bases bilaterally with complete impaction of the left lower lobar bronchus and complete collapse of the left lower lobe. This appears similar to prior examination. Right middle lobe collapse has improved. Trace associated left pleural effusion is stable. No pneumothorax. Upper Abdomen: Splenomegaly is partially visualized, but appears stable no acute abnormality. Musculoskeletal: No acute bone abnormality. No lytic or blastic bone lesion. IMPRESSION: Progressive diffuse bronchial wall thickening and development of a asymmetric multifocal pulmonary infiltrate in keeping with progressive changes of multifocal bronchopneumonia in the appropriate clinical setting. Multifocal airway impaction at the visualized lung bases with complete impaction of the left lower lobar pulmonary bronchus and complete left lower lobe collapse, unchanged. Interval improvement in right middle lobe collapse. Stable trace left pleural effusion. Extensive coronary artery calcification. Morphologic changes in keeping with pulmonary arterial hypertension. Stable shotty bilateral axillary adenopathy, nonspecific. Aortic Atherosclerosis (ICD10-I70.0). Electronically Signed   By: Fidela Salisbury M.D.   On: 07/27/2021 20:09     Labs:   Basic Metabolic Panel: Recent Labs  Lab 07/27/21 1404 07/28/21 0415  NA 139 136  K 3.8 4.0  CL 93* 93*  CO2 35* 32  GLUCOSE 169* 164*  BUN 30* 38*  CREATININE 3.72* 4.40*  CALCIUM 8.8* 8.7*   GFR Estimated Creatinine Clearance: 8 mL/min (A) (by C-G formula based on SCr of 4.4 mg/dL (H)). Liver Function Tests: Recent Labs  Lab 07/27/21 1404  AST 28  ALT 15  ALKPHOS 99  BILITOT 0.6  PROT 5.0*  ALBUMIN 2.6*   No results for input(s): LIPASE, AMYLASE in the last 168 hours. No results  for input(s): AMMONIA in the last 168 hours. Coagulation profile No results for input(s): INR, PROTIME in the last 168  hours.  CBC: Recent Labs  Lab 07/27/21 1404 07/28/21 0415  WBC 64.5* 72.3*  NEUTROABS 0.6*  --   HGB 10.4* 9.2*  HCT 33.0* 29.8*  MCV 85.7 85.4  PLT 53* 48*   Cardiac Enzymes: No results for input(s): CKTOTAL, CKMB, CKMBINDEX, TROPONINI in the last 168 hours. BNP: Invalid input(s): POCBNP CBG: Recent Labs  Lab 07/28/21 0539 07/28/21 0744 07/28/21 1151 07/28/21 1600 07/28/21 2134  GLUCAP 145* 150* 169* 120* 191*   D-Dimer No results for input(s): DDIMER in the last 72 hours. Hgb A1c No results for input(s): HGBA1C in the last 72 hours. Lipid Profile No results for input(s): CHOL, HDL, LDLCALC, TRIG, CHOLHDL, LDLDIRECT in the last 72 hours. Thyroid function studies No results for input(s): TSH, T4TOTAL, T3FREE, THYROIDAB in the last 72 hours.  Invalid input(s): FREET3 Anemia work up No results for input(s): VITAMINB12, FOLATE, FERRITIN, TIBC, IRON, RETICCTPCT in the last 72 hours. Microbiology Recent Results (from the past 240 hour(s))  Blood culture (routine x 2)     Status: None (Preliminary result)   Collection Time: 07/27/21  2:04 PM   Specimen: Site Not Specified; Blood  Result Value Ref Range Status   Specimen Description SITE NOT SPECIFIED  Final   Special Requests   Final    BOTTLES DRAWN AEROBIC AND ANAEROBIC Blood Culture adequate volume   Culture   Final    NO GROWTH 4 DAYS Performed at [facility], [place] [address] [place], [place] [address]    Report Status PENDING  Incomplete  Resp Panel by RT-PCR (Flu A&B, Covid) Nasopharyngeal Swab     Status: Abnormal   Collection Time: 07/27/21  6:32 PM   Specimen: Nasopharyngeal Swab; Nasopharyngeal(NP) swabs in vial transport medium  Result Value Ref Range Status   SARS Coronavirus 2 by RT PCR POSITIVE (A) NEGATIVE Final    Comment: RESULT CALLED TO, READ BACK BY AND VERIFIED WITH: D HARRIS RN 1951 07/27/21 A BROWNING (NOTE) SARS-CoV-2 target nucleic acids are DETECTED.  The SARS-CoV-2 RNA is  generally detectable in upper respiratory specimens during the acute phase of infection. Positive results are indicative of the presence of the identified virus, but do not rule out bacterial infection or co-infection with other pathogens not detected by the test. Clinical correlation with patient history and other diagnostic information is necessary to determine patient infection status. The expected result is Negative.  Fact Sheet for Patients: EntrepreneurPulse.com.au  Fact Sheet for Healthcare Providers: IncredibleEmployment.be  This test is not yet approved or cleared by the [place] FDA and  has been authorized for detection and/or diagnosis of SARS-CoV-2 by FDA under an Emergency Use Authorization (EUA).  This EUA will remain in effect (meaning this test can  be used) for the duration of  the COVID-19 declaration under Section 564(b)(1) of the Act, 21 U.S.C. section 360bbb-3(b)(1), unless the authorization is terminated or revoked sooner.     Influenza A by PCR NEGATIVE NEGATIVE Final   Influenza B by PCR NEGATIVE NEGATIVE Final    Comment: (NOTE) The Xpert Xpress SARS-CoV-2/FLU/RSV plus assay is intended as an aid in the diagnosis of influenza from Nasopharyngeal swab specimens and should not be used as a sole basis for treatment. Nasal washings and aspirates are unacceptable for Xpert Xpress SARS-CoV-2/FLU/RSV testing.  Fact Sheet for Patients: EntrepreneurPulse.com.au  Fact Sheet for Healthcare Providers: IncredibleEmployment.be  This test is not yet approved or cleared by the [place] and has been authorized for detection and/or diagnosis of SARS-CoV-2 by FDA under an Emergency Use Authorization (EUA). This EUA will remain in effect (meaning this test can be used) for the duration of the COVID-19 declaration under Section 564(b)(1) of the Act, 21 U.S.C. section 360bbb-3(b)(1),  unless the authorization is terminated or revoked.  Performed at [facility], [place] [address], [name], [place] [address]   Blood culture (routine x 2)     Status: None (Preliminary result)   Collection Time: 07/28/21  4:15 AM   Specimen: BLOOD RIGHT HAND  Result Value Ref Range Status   Specimen Description BLOOD RIGHT HAND  Final   Special Requests   Final    BOTTLES DRAWN AEROBIC AND ANAEROBIC Blood Culture adequate volume   Culture   Final    NO GROWTH 3 DAYS Performed at [facility], [place] [address], [place],  [address]    Report Status PENDING  Incomplete     Discharge Instructions:   Discharge Instructions     Increase activity slowly   Complete by: As directed    No wound care   Complete by: As directed       Allergies as of 07/31/2021       Reactions   Diltiazem    Lisinopril Cough        Medication List     STOP taking these medications    acetaminophen 325 MG tablet Commonly known as: TYLENOL   amLODipine 10 MG tablet Commonly known as: NORVASC   atorvastatin 40 MG tablet Commonly known as: LIPITOR   Bactroban Nasal 2 % Generic drug: mupirocin nasal ointment   carvedilol 25 MG tablet Commonly known as: COREG   feeding supplement (NEPRO CARB STEADY) Liqd   hydrALAZINE 50 MG tablet Commonly known as: APRESOLINE   insulin aspart 100 UNIT/ML injection Commonly known as: novoLOG   levETIRAcetam 250 MG tablet Commonly known as: KEPPRA   levETIRAcetam 500 MG tablet Commonly known as: KEPPRA   Lidocaine (Anorectal) 5 % Crea   loratadine 10 MG tablet Commonly known as: CLARITIN   melatonin 3 MG Tabs tablet   oxyCODONE 5 MG immediate release tablet Commonly known as: Oxy IR/ROXICODONE   pantoprazole 40 MG tablet Commonly known as: PROTONIX   sertraline 50 MG tablet Commonly known as: ZOLOFT       TAKE these medications    albuterol 108 (90 Base) MCG/ACT inhaler Commonly known as: VENTOLIN HFA Inhale  1 puff into the lungs every 6 (six) hours as needed for wheezing or shortness of breath.   fentaNYL 50 MCG/ML injection Commonly known as: SUBLIMAZE Inject 0.5-1 mLs (25-50 mcg total) into the vein every hour as needed (Dyspnea/Pain).   glycopyrrolate 0.2 MG/ML injection Commonly known as: ROBINUL Inject 1 mL (0.2 mg total) into the skin every 4 (four) hours as needed (excessive secretions).   haloperidol 2 MG/ML solution Commonly known as: HALDOL Place 0.3 mLs (0.6 mg total) under the tongue every 4 (four) hours as needed for agitation (or delirium).   hydrocortisone-pramoxine 2.5-1 % rectal cream Commonly known as: ANALPRAM-HC Place 1 application rectally 3 (three) times daily.   LORazepam 2 MG/ML injection Commonly known as: ATIVAN Inject 0.25-0.5 mLs (0.5-1 mg total) into the vein every hour as needed for anxiety, seizure or sedation.          Time coordinating discharge: 35 min  Signed:  Geradine Girt DO  [facility] 07/31/2021, 12:44 PM

## 2021-07-31 NOTE — Progress Notes (Signed)
Report called to Butch Penny at [facility] at bedside to transport patient. Husband at bedside and aware, has patient's hearing aid in his possession.

## 2021-07-31 NOTE — TOC Transition Note (Signed)
Transition of Care [facility]) - CM/SW Discharge Note   Patient Details  Name: Christine Gibson MRN: [id number] Date of Birth: 1951-06-03  [facility]) CM/SW Contact:  Benard Halsted, LCSW Phone Number: 07/31/2021, 1:48 PM   Clinical Narrative:    Patient will DC to: [facility] Anticipated DC date: 07/31/21 Family notified: Spouse Transport by: Corey Harold   Per MD patient ready for DC to Hospice. RN to call report prior to discharge [phone]). RN, patient, patient's family, and facility notified of DC. Discharge Summary sent to facility. DC packet on chart. Ambulance transport requested for patient.   CSW will sign off for now as social work intervention is no longer needed. Please consult [place] again if new needs arise.     Final next level of care: [place] Barriers to Discharge: No Barriers Identified   Patient Goals and CMS Choice Patient states their goals for this hospitalization and ongoing recovery are:: comfort CMS Medicare.gov Compare Post Acute Care list provided to:: Patient Represenative (must comment) Choice offered to / list presented to : Spouse  Discharge Placement              Patient chooses bed at:  ([facility] Patient to be transferred to facility by: [place] Name of family member notified: Spouse Patient and family notified of of transfer: 07/31/21  Discharge Plan and Services                                     Social Determinants of Health (SDOH) Interventions     Readmission Risk Interventions No flowsheet data found.

## 2021-07-31 NOTE — Progress Notes (Signed)
[facility]) Hospital Liaison Note   [facility] is able to offer a bed to patient today.    Family agreeable to transfer today. Cedric Fishman, LCSW [facility] Manager aware.   RN please call report to [facility] at [phone] prior to patient leaving the unit.  Please send signed and completed DNR with patient at discharge.   Please do not hesitate to call with any hospice related questions.    Thank you for the opportunity to participate in this patient's care.   Bobbie "Loren Racer, RN, BSN [facility] Liaison [phone]

## 2021-07-31 NOTE — Consult Note (Signed)
   [facility] Inpatient Consult   07/31/2021  NAYLEA WIGINGTON 1951/03/21 [id number]  [place] [facility] [ACO] Patient: [facility] Medicare  Patient screened for less than 7 days readmission.  Patient transitioned to [facility]  No [facility] follow up planned, needs to be met at [facility] for post hospital care.  Natividad Brood, RN BSN [facility] Liaison  [phone] business mobile phone Toll free office [phone]  Fax number: [phone] [email or web address]

## 2021-08-01 LAB — CULTURE, BLOOD (ROUTINE X 2)
Culture: NO GROWTH
Special Requests: ADEQUATE

## 2021-08-02 LAB — CULTURE, BLOOD (ROUTINE X 2)
Culture: NO GROWTH
Special Requests: ADEQUATE

## 2021-08-22 DEATH — deceased
